# Patient Record
Sex: Female | Born: 1970
Health system: Southern US, Community
[De-identification: ages and names within clinical notes are randomized; demographics above are authoritative.]

## PROBLEM LIST (undated history)

## (undated) DIAGNOSIS — D649 Anemia, unspecified: Secondary | ICD-10-CM

## (undated) DIAGNOSIS — K222 Esophageal obstruction: Secondary | ICD-10-CM

## (undated) DIAGNOSIS — D689 Coagulation defect, unspecified: Secondary | ICD-10-CM

## (undated) DIAGNOSIS — K30 Functional dyspepsia: Secondary | ICD-10-CM

## (undated) DIAGNOSIS — I428 Other cardiomyopathies: Secondary | ICD-10-CM

## (undated) DIAGNOSIS — F4321 Adjustment disorder with depressed mood: Secondary | ICD-10-CM

## (undated) DIAGNOSIS — E611 Iron deficiency: Secondary | ICD-10-CM

## (undated) DIAGNOSIS — Z95810 Presence of automatic (implantable) cardiac defibrillator: Secondary | ICD-10-CM

## (undated) DIAGNOSIS — K589 Irritable bowel syndrome without diarrhea: Secondary | ICD-10-CM

## (undated) DIAGNOSIS — F419 Anxiety disorder, unspecified: Secondary | ICD-10-CM

## (undated) DIAGNOSIS — I82409 Acute embolism and thrombosis of unspecified deep veins of unspecified lower extremity: Secondary | ICD-10-CM

## (undated) DIAGNOSIS — T7840XA Allergy, unspecified, initial encounter: Secondary | ICD-10-CM

## (undated) DIAGNOSIS — I5022 Chronic systolic (congestive) heart failure: Secondary | ICD-10-CM

## (undated) DIAGNOSIS — IMO0002 Reserved for concepts with insufficient information to code with codable children: Secondary | ICD-10-CM

## (undated) DIAGNOSIS — D573 Sickle-cell trait: Secondary | ICD-10-CM

## (undated) DIAGNOSIS — E559 Vitamin D deficiency, unspecified: Secondary | ICD-10-CM

## (undated) DIAGNOSIS — K219 Gastro-esophageal reflux disease without esophagitis: Secondary | ICD-10-CM

## (undated) DIAGNOSIS — A048 Other specified bacterial intestinal infections: Secondary | ICD-10-CM

## (undated) HISTORY — DX: Acute embolism and thrombosis of unspecified deep veins of unspecified lower extremity: I82.409

## (undated) HISTORY — DX: Irritable bowel syndrome, unspecified: K58.9

## (undated) HISTORY — DX: Vitamin D deficiency, unspecified: E55.9

## (undated) HISTORY — DX: Anemia, unspecified: D64.9

## (undated) HISTORY — DX: Allergy, unspecified, initial encounter: T78.40XA

## (undated) HISTORY — DX: Gastro-esophageal reflux disease without esophagitis: K21.9

## (undated) HISTORY — DX: Iron deficiency: E61.1

## (undated) HISTORY — DX: Gastro-esophageal reflux disease without esophagitis: K22.2

## (undated) HISTORY — DX: Functional dyspepsia: K30

## (undated) HISTORY — DX: Coagulation defect, unspecified: D68.9

## (undated) HISTORY — DX: Sickle-cell trait: D57.3

## (undated) HISTORY — DX: Reserved for concepts with insufficient information to code with codable children: IMO0002

## (undated) HISTORY — PX: WISDOM TOOTH EXTRACTION: SHX21

## (undated) HISTORY — DX: Other specified bacterial intestinal infections: A04.8

---

## 1989-09-07 HISTORY — PX: TUBAL LIGATION: SHX77

## 2001-02-11 ENCOUNTER — Encounter: Payer: Self-pay | Admitting: Pulmonary Disease

## 2001-02-21 ENCOUNTER — Encounter: Payer: Self-pay | Admitting: Pulmonary Disease

## 2004-07-10 ENCOUNTER — Ambulatory Visit: Payer: Self-pay | Admitting: Pulmonary Disease

## 2005-11-09 ENCOUNTER — Ambulatory Visit: Payer: Self-pay | Admitting: Pulmonary Disease

## 2006-07-23 ENCOUNTER — Other Ambulatory Visit: Admission: RE | Admit: 2006-07-23 | Discharge: 2006-07-23 | Payer: Self-pay | Admitting: Obstetrics & Gynecology

## 2006-11-17 ENCOUNTER — Ambulatory Visit: Payer: Self-pay | Admitting: Pulmonary Disease

## 2007-07-28 ENCOUNTER — Other Ambulatory Visit: Admission: RE | Admit: 2007-07-28 | Discharge: 2007-07-28 | Payer: Self-pay | Admitting: Obstetrics & Gynecology

## 2007-12-28 ENCOUNTER — Ambulatory Visit: Payer: Self-pay | Admitting: Pulmonary Disease

## 2007-12-28 DIAGNOSIS — K219 Gastro-esophageal reflux disease without esophagitis: Secondary | ICD-10-CM | POA: Insufficient documentation

## 2007-12-28 DIAGNOSIS — J45909 Unspecified asthma, uncomplicated: Secondary | ICD-10-CM | POA: Insufficient documentation

## 2007-12-28 DIAGNOSIS — K222 Esophageal obstruction: Secondary | ICD-10-CM

## 2008-09-06 ENCOUNTER — Other Ambulatory Visit: Admission: RE | Admit: 2008-09-06 | Discharge: 2008-09-06 | Payer: Self-pay | Admitting: Obstetrics and Gynecology

## 2008-11-23 ENCOUNTER — Telehealth (INDEPENDENT_AMBULATORY_CARE_PROVIDER_SITE_OTHER): Payer: Self-pay | Admitting: *Deleted

## 2008-11-28 ENCOUNTER — Telehealth: Payer: Self-pay | Admitting: Gastroenterology

## 2008-11-28 ENCOUNTER — Ambulatory Visit: Payer: Self-pay | Admitting: Pulmonary Disease

## 2008-11-29 ENCOUNTER — Ambulatory Visit: Payer: Self-pay | Admitting: Internal Medicine

## 2008-11-29 DIAGNOSIS — K222 Esophageal obstruction: Secondary | ICD-10-CM | POA: Insufficient documentation

## 2008-11-29 DIAGNOSIS — R6881 Early satiety: Secondary | ICD-10-CM | POA: Insufficient documentation

## 2008-11-29 DIAGNOSIS — K5900 Constipation, unspecified: Secondary | ICD-10-CM | POA: Insufficient documentation

## 2008-11-30 ENCOUNTER — Encounter: Payer: Self-pay | Admitting: Internal Medicine

## 2008-11-30 ENCOUNTER — Ambulatory Visit: Payer: Self-pay | Admitting: Internal Medicine

## 2008-11-30 HISTORY — PX: ESOPHAGOGASTRODUODENOSCOPY (EGD) WITH ESOPHAGEAL DILATION: SHX5812

## 2008-12-04 ENCOUNTER — Telehealth (INDEPENDENT_AMBULATORY_CARE_PROVIDER_SITE_OTHER): Payer: Self-pay | Admitting: *Deleted

## 2008-12-06 ENCOUNTER — Telehealth (INDEPENDENT_AMBULATORY_CARE_PROVIDER_SITE_OTHER): Payer: Self-pay | Admitting: *Deleted

## 2008-12-08 ENCOUNTER — Emergency Department (HOSPITAL_COMMUNITY): Admission: EM | Admit: 2008-12-08 | Discharge: 2008-12-08 | Payer: Self-pay | Admitting: Emergency Medicine

## 2008-12-10 ENCOUNTER — Telehealth: Payer: Self-pay | Admitting: Internal Medicine

## 2008-12-11 ENCOUNTER — Telehealth (INDEPENDENT_AMBULATORY_CARE_PROVIDER_SITE_OTHER): Payer: Self-pay | Admitting: *Deleted

## 2008-12-12 ENCOUNTER — Ambulatory Visit (HOSPITAL_COMMUNITY): Admission: RE | Admit: 2008-12-12 | Discharge: 2008-12-12 | Payer: Self-pay | Admitting: Pulmonary Disease

## 2008-12-12 ENCOUNTER — Encounter: Payer: Self-pay | Admitting: Pulmonary Disease

## 2008-12-12 ENCOUNTER — Ambulatory Visit: Payer: Self-pay | Admitting: Internal Medicine

## 2008-12-20 ENCOUNTER — Telehealth (INDEPENDENT_AMBULATORY_CARE_PROVIDER_SITE_OTHER): Payer: Self-pay | Admitting: *Deleted

## 2009-01-15 ENCOUNTER — Telehealth (INDEPENDENT_AMBULATORY_CARE_PROVIDER_SITE_OTHER): Payer: Self-pay | Admitting: *Deleted

## 2009-01-15 ENCOUNTER — Telehealth: Payer: Self-pay | Admitting: Internal Medicine

## 2009-01-15 ENCOUNTER — Ambulatory Visit: Payer: Self-pay | Admitting: Internal Medicine

## 2009-01-15 DIAGNOSIS — A048 Other specified bacterial intestinal infections: Secondary | ICD-10-CM

## 2009-01-15 HISTORY — DX: Other specified bacterial intestinal infections: A04.8

## 2009-04-08 ENCOUNTER — Telehealth: Payer: Self-pay | Admitting: Internal Medicine

## 2009-05-15 ENCOUNTER — Ambulatory Visit: Payer: Self-pay | Admitting: Internal Medicine

## 2009-05-15 LAB — CONVERTED CEMR LAB
ALT: 15 units/L (ref 0–35)
AST: 20 units/L (ref 0–37)
Albumin: 4.1 g/dL (ref 3.5–5.2)
Alkaline Phosphatase: 47 units/L (ref 39–117)
BUN: 8 mg/dL (ref 6–23)
Basophils Absolute: 0 10*3/uL (ref 0.0–0.1)
Basophils Relative: 0.3 % (ref 0.0–3.0)
CO2: 28 meq/L (ref 19–32)
Calcium: 9.3 mg/dL (ref 8.4–10.5)
Chloride: 107 meq/L (ref 96–112)
Creatinine, Ser: 0.9 mg/dL (ref 0.4–1.2)
Eosinophils Absolute: 0.2 10*3/uL (ref 0.0–0.7)
Eosinophils Relative: 5.3 % — ABNORMAL HIGH (ref 0.0–5.0)
GFR calc non Af Amer: 89.31 mL/min (ref >60)
Glucose, Bld: 80 mg/dL (ref 70–99)
HCT: 35.5 % — ABNORMAL LOW (ref 36.0–46.0)
Hemoglobin: 12.2 g/dL (ref 12.0–15.0)
Lymphocytes Relative: 35.5 % (ref 12.0–46.0)
Lymphs Abs: 1.6 10*3/uL (ref 0.7–4.0)
MCHC: 34.4 g/dL (ref 30.0–36.0)
MCV: 85.3 fL (ref 78.0–100.0)
Monocytes Absolute: 0.5 10*3/uL (ref 0.1–1.0)
Monocytes Relative: 10.2 % (ref 3.0–12.0)
Neutro Abs: 2.3 10*3/uL (ref 1.4–7.7)
Neutrophils Relative %: 48.7 % (ref 43.0–77.0)
Platelets: 196 10*3/uL (ref 150.0–400.0)
Potassium: 3.9 meq/L (ref 3.5–5.1)
RBC: 4.16 M/uL (ref 3.87–5.11)
RDW: 13.1 % (ref 11.5–14.6)
Sodium: 143 meq/L (ref 135–145)
TSH: 0.37 microintl units/mL (ref 0.35–5.50)
Total Bilirubin: 0.8 mg/dL (ref 0.3–1.2)
Total Protein: 7.3 g/dL (ref 6.0–8.3)
WBC: 4.6 10*3/uL (ref 4.5–10.5)

## 2009-05-17 ENCOUNTER — Ambulatory Visit (HOSPITAL_COMMUNITY): Admission: RE | Admit: 2009-05-17 | Discharge: 2009-05-17 | Payer: Self-pay | Admitting: Internal Medicine

## 2009-05-21 ENCOUNTER — Telehealth: Payer: Self-pay | Admitting: Internal Medicine

## 2009-08-02 ENCOUNTER — Ambulatory Visit: Payer: Self-pay | Admitting: Internal Medicine

## 2009-12-26 ENCOUNTER — Telehealth (INDEPENDENT_AMBULATORY_CARE_PROVIDER_SITE_OTHER): Payer: Self-pay | Admitting: *Deleted

## 2010-01-22 ENCOUNTER — Ambulatory Visit: Payer: Self-pay | Admitting: Pulmonary Disease

## 2010-03-06 ENCOUNTER — Encounter: Payer: Self-pay | Admitting: Internal Medicine

## 2010-03-06 LAB — CONVERTED CEMR LAB
Hemoglobin: 12.2 g/dL
MCV: 83 fL
Platelets: 214 10*3/uL
TSH: 1.56 microintl units/mL
WBC: 4.6 10*3/uL

## 2010-03-14 ENCOUNTER — Encounter: Payer: Self-pay | Admitting: Internal Medicine

## 2010-04-15 ENCOUNTER — Encounter: Payer: Self-pay | Admitting: Internal Medicine

## 2010-04-16 ENCOUNTER — Emergency Department (HOSPITAL_COMMUNITY): Admission: EM | Admit: 2010-04-16 | Discharge: 2010-04-16 | Payer: Self-pay | Admitting: Emergency Medicine

## 2010-05-06 ENCOUNTER — Ambulatory Visit: Payer: Self-pay | Admitting: Internal Medicine

## 2010-08-29 ENCOUNTER — Telehealth: Payer: Self-pay | Admitting: Pulmonary Disease

## 2010-10-07 NOTE — Assessment & Plan Note (Signed)
Summary: f/u asthma   Chief Complaint:  Yearly Follow up. Pt denied any new complaints.  Pt needs new rx for Nexium and Advair. Marland Kitchen  History of Present Illness: the patient comes in today for follow-up of her chronic asthma.  She has been on appropriate medications, and has been using these compliantly.  She has had no increase in rescue inhaler use, and no nocturnal symptoms.  She has had no change in her exercise tolerance, and is satisfied with her quality of life with respect to activity.  She has had occasional breakthrough reflux symptoms, but not overly significant.  She continues on her proton pump inhibitor.     Current Allergies: No known allergies      Review of Systems      See HPI   Vital Signs:  Patient Profile:   40 Years Old Female Weight:      127.25 pounds O2 Sat:      96 % O2 treatment:    Room Air Temp:     98.1 degrees F oral Pulse rate:   72 / minute BP sitting:   110 / 86  (left arm) Cuff size:   regular  Vitals Entered By: Cyndia Diver LPN (December 28, 2007 9:59 AM)             Comments Medications reviewed with patient  ..................................................................Marland KitchenCyndia Diver LPN  December 28, 2007 9:59 AM      Physical Exam  General:     thin female in no acute distress Lungs:     totally clear to auscultation with no wheezing Heart:     regular rate and rhythm, no MRG     Impression & Recommendations:  Problem # 1:  ASTHMA (ICD-493.90) the patient's asthma is well controlled at this time.  She is having no difficulty with her medications, and has not been using her rescue inhaler frequently.  I have asked her to stay on her current regimen, including her reflux medications.  She will follow-up in one year or sooner if there are problems.  Medications Added to Medication List This Visit: 1)  Ventolin Hfa 108 (90 Base) Mcg/act Aers (Albuterol sulfate) .... Inhale 2 puffs every 4 to 6 hours as needed   Patient  Instructions: 1)  Please schedule a follow-up appointment in 1 year.    Prescriptions: NEXIUM 40 MG  PACK (ESOMEPRAZOLE MAGNESIUM) Take 1 tablet by mouth two times a day  #180 x 6   Entered and Authorized by:   Barbaraann Share MD   Signed by:   Barbaraann Share MD on 12/28/2007   Method used:   Print then Give to Patient   RxID:   [id number] ADVAIR DISKUS 250-50 MCG/DOSE  MISC (FLUTICASONE-SALMETEROL) Inhale 1 puff two times a day  #3 x 6   Entered and Authorized by:   Barbaraann Share MD   Signed by:   Barbaraann Share MD on 12/28/2007   Method used:   Print then Give to Patient   RxID:   [id number]  ]

## 2010-10-07 NOTE — Progress Notes (Signed)
Summary: Question about medication dosage  Phone Note Call from Patient Call back at [phone]   Call For: Dr Leone Payor Reason for Call: Talk to Nurse Summary of Call: Question about medication Dr Leone Payor ordered. How often is she to take it? Initial call taken by: Leanor Kail [facility],  May 21, 2009 9:45 AM  Follow-up for Phone Call        Pt had questions re dosage of Buspirone.  Went over directions with pt.  Pt stated she understood. Ashok Cordia RN  May 21, 2009 10:10 AM

## 2010-10-07 NOTE — Letter (Signed)
Summary: [facility]  [facility]   Imported By: Lennie Odor 05/13/2010 15:31:23  _____________________________________________________________________  External Attachment:    Type:   Image     Comment:   External Document

## 2010-10-07 NOTE — Assessment & Plan Note (Signed)
Summary: rov for asthma   Copy to:  Marcelyn Bruins, MD Primary Provider/Referring Provider:  Roxine Caddy, MD  CC:  Pt is here for a f/u appt.  Pt was last seen March 2010.  Pt states breathing is "fine."  Pt denied a cough.  Pt states she would like written rx for 90 day supply of Nexium.  Pt also wants handicap form filled out. Marland Kitchen  History of Present Illness: the pt comes in today for f/u of her known asthma.  She has been doing well on her current regimen, and rarely uses her rescue inhaler.  She denies cough, congestion, or mucus.  She is staying on her ppi for GERD  Current Medications (verified): 1)  Advair Diskus 250-50 Mcg/dose  Misc (Fluticasone-Salmeterol) .... Inhale 1 Puff Two Times A Day 2)  Nexium 40 Mg  Pack (Esomeprazole Magnesium) .... Take 1 Tablet By Mouth Two Times A Day 3)  Ventolin Hfa 108 (90 Base) Mcg/act  Aers (Albuterol Sulfate) .... Inhale 2 Puffs Every 4 To 6 Hours As Needed 4)  Buspirone Hcl 10 Mg Tabs (Buspirone Hcl) .... As Needed  Allergies (verified): 1)  ! Amoxicillin 2)  ! Biaxin  Review of Systems      See HPI  Vital Signs:  Patient profile:   40 year old female Height:      61 inches Weight:      134 pounds BMI:     25.41 O2 Sat:      97 % on Room air Temp:     98.1 degrees F oral Pulse rate:   70 / minute BP sitting:   130 / 78  (left arm) Cuff size:   regular  Vitals Entered By: Arman Filter LPN (Jan 22, 2010 9:28 AM)  O2 Flow:  Room air CC: Pt is here for a f/u appt.  Pt was last seen March 2010.  Pt states breathing is "fine."  Pt denied a cough.  Pt states she would like written rx for 90 day supply of Nexium.  Pt also wants handicap form filled out.  Comments Medications reviewed with patient Arman Filter LPN  Jan 22, 2010 9:28 AM    Physical Exam  General:  wd female in nad Lungs:  totally clear to auscultation Heart:  rrr, no mrg   Impression & Recommendations:  Problem # 1:  ASTHMA (ICD-493.90) the pt is doing well from  an asthma standpoint on her current meds, with no recent exacerbation or rescue inhaler use.  I have asked her to continue on the current meds, and to f/u in one year.  Medications Added to Medication List This Visit: 1)  Buspirone Hcl 10 Mg Tabs (Buspirone hcl) .... As needed  Other Orders: Est. Patient Level II ([address])  Patient Instructions: 1)  no change in meds 2)  stay active 3)  followup with me in one year.   Prescriptions: NEXIUM 40 MG  PACK (ESOMEPRAZOLE MAGNESIUM) Take 1 tablet by mouth two times a day  #180 x 4   Entered and Authorized by:   Barbaraann Share MD   Signed by:   Barbaraann Share MD on 01/22/2010   Method used:   Print then Give to Patient   RxID:   [id number]

## 2010-10-07 NOTE — Assessment & Plan Note (Signed)
Summary: DYSPHAGIA, EARLY SATIETY, BLOATING   History of Present Illness Visit Type: Follow-up Visit Primary GI MD: Stan Head MD [facility] Primary Provider: Roxine Caddy, MD Requesting Provider: n/a Chief Complaint: early satiety, dysphagia History of Present Illness:   Forcing herself to eat Feels like food hangs in her throat at times, e.g. chicken, not once daily, perhaps about 1x/week still getting early satiety bloated all the time post-prandial belching frequently her sleep is disturbed by stomach issues like abdominal pain about 2-3 x/week weight loss of 5# since May, has had similar weights in past  she admits to stress and anxiety and concern about uncovered medical problems she is separated and that is a cause of anxiety, kids are acting out sometimes   GI Review of Systems    Reports abdominal pain, belching, bloating, dysphagia with solids, loss of appetite, and  nausea.     Location of  Abdominal pain: epigastric area.    Denies acid reflux, chest pain, dysphagia with liquids, heartburn, vomiting, vomiting blood, weight loss, and  weight gain.      Reports change in bowel habits, constipation, and  diarrhea.     Denies anal fissure, black tarry stools, diverticulosis, fecal incontinence, heme positive stool, hemorrhoids, irritable bowel syndrome, jaundice, light color stool, liver problems, rectal bleeding, and  rectal pain.    Current Medications (verified): 1)  Advair Diskus 250-50 Mcg/dose  Misc (Fluticasone-Salmeterol) .... Inhale 1 Puff Two Times A Day 2)  Nexium 40 Mg  Pack (Esomeprazole Magnesium) .... Take 1 Tablet By Mouth Two Times A Day 3)  Ventolin Hfa 108 (90 Base) Mcg/act  Aers (Albuterol Sulfate) .... Inhale 2 Puffs Every 4 To 6 Hours As Needed  Allergies (verified): 1)  ! Amoxicillin 2)  ! Biaxin  Past History:  Past Medical History: G E R D Esophaeal stricture (dilated 2010) H. pylori gastritis treated with Pylera Asthma     Past Surgical  History: Reviewed history from 11/29/2008 and no changes required. Tubal Ligation  Family History: Reviewed history from 01/15/2009 and no changes required. 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  Alcohol Use - no Daily Caffeine Use  <1 Illicit Drug Use - no Separated, 2 children at home, 1 grown  Review of Systems       some hot flashes/sweats at night last week, followed by chills (2 nights), associaed with diarrhea no true fever no significant insomnia + fatigue  Vital Signs:  Patient profile:   40 year old female Height:      61 inches Weight:      124.13 pounds BMI:     23.54 BSA:     1.54 Pulse rate:   72 / minute Pulse rhythm:   regular BP sitting:   126 / 82  (right arm)  Vitals Entered By: Hortense Ramal CMA Duncan Dull) (May 15, 2009 9:48 AM)  Physical Exam  General:  Well developed, well nourished, no acute distress. Eyes:  anicteric Neck:  Supple; no masses or thyromegaly. Lungs:  Clear throughout to auscultation. Heart:  Regular rate and rhythm; no murmurs, rubs,  or bruits. Abdomen:  Abdomen soft, nontender, nondistended. No obvious masses or hepatomegaly. No obvious hernias. Normal bowel sounds.  Neurologic:  Alert and  oriented x4;  Psych:  somewhat flat affect   Impression & Recommendations:  Problem # 1:  DYSPHAGIA (ZOX-096.04) Assessment Improved at EGD I dilated ring  in 3/10 better but not relieved ? ineffective dilation, motility issue or relationship to situational stressors/amxiety  Orders: Barium Swallow with Tablet (BS w/tab)  Problem # 2:  EARLY SATIETY (ICD-780.94) Assessment: Unchanged did not resolve after treatment of H. pylori with Pylera check labs ? TCA, Buspar, anti-spasmodic  Orders: TLB-CBC Platelet - w/Differential (85025-CBCD) TLB-CMP (Comprehensive Metabolic Pnl) (80053-COMP)  TLB-TSH (Thyroid Stimulating Hormone) (13244-WNU)  Patient Instructions: 1)  Please go to the basement to have your lab tests drawn today. 2)  This office will call after your xray and labs are complete and Dr. Leone Payor will recommend a treatment plan and/or other testing at that time. 3)  Copy sent to : Roxine Caddy, MD 4)  The medication list was reviewed and reconciled.  All changed / newly prescribed medications were explained.  A complete medication list was provided to the patient / caregiver.

## 2010-10-07 NOTE — Procedures (Signed)
Summary: EGD - ring (dilated) and H. pylori gastritis   EGD  Procedure date:  11/30/2008  Findings:      Location: [facility]    EGD  Procedure date:  11/30/2008  Findings:      1) Ring, esophageal at the gastroesophageal junction, dilated 54 French 2) Mild non-erosive gastritis in the total stomach, biopsied H. PYLORI + 3) Otherwise normal examination.  Comments:      will treat H. pylori with Amoxicillin and Biaxin and Nexium  ENDOSCOPY PROCEDURE REPORT  PATIENT:  Mary Pratt, Mary Pratt  MR#:  [id number] BIRTHDATE:   1970/10/09, 37 yrs. old   GENDER:   female  ENDOSCOPIST:   Iva Boop, Mary Pratt, [facility]    PROCEDURE DATE:  11/30/2008 PROCEDURE:  EGD with biopsy, Elease Hashimoto Dilation of the Esophagus ASA CLASS:   Class II INDICATIONS: 1) dysphagia early satiety  MEDICATIONS:    Epinephrine 75 mcg IV, Versed 6 mg IV TOPICAL ANESTHETIC:   Exactacain Spray  DESCRIPTION OF PROCEDURE:   After the risks benefits and alternatives of the procedure were thoroughly explained, informed consent was obtained.  The  endoscope was introduced through the mouth and advanced to the second portion of the duodenum, without limitations.  The instrument was slowly withdrawn as the mucosa was carefully examined. <<PROCEDUREIMAGES>>          <<OLD IMAGES>>  An esophageal ring was found at the gastroesophageal junction. ? of distal esophageal ring.  Mild gastritis was found in the total stomach. Multiple biopsies were obtained and sent to pathology.  The examination was otherwise normal.    Dilation was then performed at the total esophagus  1) Dilator:   Elease Hashimoto   Size(s):   54 French Resistance:   Minimal   Heme:   none Appearance:   No relook   COMPLICATIONS:   None  ENDOSCOPIC IMPRESSION:  1) Ring, esophageal at the gastroesophageal junction, dilated 54 French  2) Mild non-erosive gastritis in the total stomach, biopsied  3) Otherwise normal examination. RECOMMENDATIONS:  1)  Clear liquids until 12 noon, then soft diet, then normal diet tomorrow.  2) After pathology in letter vs. phone call re: any therapy changes. Has been on Nexium x 5 yrs so may need different PPI. Some of symptoms suggest more of functional disturbance so may need different type of therapy.      Iva Boop, Mary Pratt, Clementeen Graham   CC: Mary Pratt, M.D. Mary Caddy, Mary Pratt The Patient       REPORT OF SURGICAL PATHOLOGY   Case #: [id number] Patient Name: Mary Pratt, Mary Pratt Office Chart Number:  N/A   MRN: [id number] Pathologist: Ferd Hibbs. Colonel Bald, Mary Pratt DOB/Age  12-01-1970 (Age: 54)    Gender: F Date Taken:  11/30/2008 Date Received: 11/30/2008   FINAL DIAGNOSIS   ***MICROSCOPIC EXAMINATION AND DIAGNOSIS***   STOMACH, BIOPSY: - CHRONIC, ACTIVE GASTRITIS WITH HELICOBACTER PYLORI.   - THERE IS NO EVIDENCE OF INTESTINAL METAPLASIA, DYSPLASIA OR MALIGNANCY. - SEE COMMENT.   COMMENT A Warthin-Starry stain highlights the presence of a few scattered organisms, consistent with Helicobacter pylori.  The control stained appropriately.   gdt Date Reported:  12/03/2008     Ferd Hibbs. Colonel Bald, Mary Pratt *** Electronically Signed Out By JBK ***  This report was created from the original endoscopy report, which was reviewed and signed by the above listed endoscopist.

## 2010-10-07 NOTE — Progress Notes (Signed)
Summary: sob  Phone Note Call from Patient Call back at [phone]   Caller: Patient Call For: clance Summary of Call: sob with allergy problem pharmacy [facility] Initial call taken by: Rickard Patience,  December 04, 2008 3:37 PM  Follow-up for Phone Call        called and spoke with pt. Pt states she saw her pcp recently and was dx with allergies and given zyrtec and omnaris to take.  pt states she is still very congested in her head and "swimmy headed"  Pt wanted to know if there is something KC can recommended.  Informed pt KC out of office until tomorrow.  Also, pt c/o increased sob.  using alb inhaler every hr with no relief of sob.   Informed pt that if she is that sob she needs to go to urgent care of ED.  Pt verbalized understanding.  Aundra Millet Reynolds LPN  December 04, 2008 3:48 PM    pt called back a few minutes later and also requested [facility] refer her to a cardiologist to see if her increased sob is cardiac related.  Aundra Millet Reynolds LPN  December 04, 2008 4:50 PM   Additional Follow-up for Phone Call Additional follow up Details #1::        I do not believe this is from her lungs, and she should  not overuse albuterol if it doesn't help.  Will schedule her for an echo to evaluate heart, and agree with going to er if worsens. Additional Follow-up by: Barbaraann Share MD,  December 05, 2008 6:34 PM    Additional Follow-up for Phone Call Additional follow up Details #2::    pt aware per dr clance the pcc's are setting up echo appt and will call her as soon as we can get this done, if symptoms worsen or persist advised pt to go to [facility]--pt states she understood.  Bjorn Loser advised to call pt's cell # once echo appt is set up Follow-up by: Philipp Deputy CMA,  December 06, 2008 10:18 AM

## 2010-10-07 NOTE — Progress Notes (Signed)
Summary: breathing problem  Phone Note Call from Patient Call back at [phone]   Caller: Patient Call For: clance Summary of Call: problem with digestion and breathing Initial call taken by: Rickard Patience,  November 23, 2008 8:58 AM  Follow-up for Phone Call        Pt states she has trouble getting deep breath in at time.  Using Nexium and Advair and Ventolin for relief.  Pt states she is not acutely SOB.  Made OV with KC for 11/28/08 to assess.  Pt instructed TCB if becomes actutely SOB for sooner appt or go to ED. Follow-up by: Cloyde Reams RN,  November 23, 2008 10:10 AM

## 2010-10-07 NOTE — Progress Notes (Signed)
Summary: medication for H Pylori  Phone Note Outgoing Call   Call placed by: Paulene Floor, RN,  December 06, 2008 9:31 AM Call placed to: Patient Summary of Call: Calling in medications for H Pylori  Follow-up for Phone Call        Scripts sent to pharmacy.  will continue the nexium.  will call on Monday to schedule follow up appointent in 4-6 weeks with dr Leone Payor Follow-up by: Paulene Floor, RN,  December 06, 2008 9:35 AM    New/Updated Medications: AMOXICILLIN 500 MG TABS (AMOXICILLIN) Take 2 tabs by mouth two times a day x 14 days BIAXIN 500 MG TABS (CLARITHROMYCIN) take 1 tab two times a day x 14 days   Prescriptions: BIAXIN 500 MG TABS (CLARITHROMYCIN) take 1 tab two times a day x 14 days  #28 x 0   Entered by:   Paulene Floor, RN   Authorized by:   Iva Boop MD   Signed by:   Paulene Floor, RN on 12/06/2008   Method used:   [facility] to        [facility]  [address] [address] [id number]       [address]       [place], [place]  [address]       Ph: [phone] or [phone]       Fax: [phone]   RxID:   [id number] AMOXICILLIN 500 MG TABS (AMOXICILLIN) Take 2 tabs by mouth two times a day x 14 days  #56 x 0   Entered by:   Paulene Floor, RN   Authorized by:   Iva Boop MD   Signed by:   Paulene Floor, RN on 12/06/2008   Method used:   [facility]        [facility]  [address] [address] [id number]       [address]       [place], [place]  [address]       Ph: [phone] or [phone]       Fax: [phone]   RxID:   [id number]   Appended Document: medication for H Pylori Flag sent to Sherri to remind patient to schedule follow up appointment.

## 2010-10-07 NOTE — Progress Notes (Signed)
Summary: reaction to meds  Phone Note Call from Patient Call back at Home Phone [phone]   Caller: Patient Call For: Leone Payor Reason for Call: Talk to Nurse Summary of Call: patient states that she has a reaction to medication given to her on Friday (CLARITHROMYCIN) Initial call taken by: Tawni Levy,  December 10, 2008 10:01 AM  Follow-up for Phone Call        Left message for patient to call back Darcey Nora RN  December 10, 2008 10:56 AM  Patient developed CP and SOB, dizziness, and HTN went to ER for symptoms.  I advised the patient to stop clarithromycin and amoxicillin and we will call her next week when Dr Leone Payor returns for alternate tx.  She states her last sode was on Saturday, but still having symptoms.  She has an appointment with her primary care MD today. Follow-up by: Darcey Nora RN,  December 10, 2008 4:10 PM  Additional Follow-up for Phone Call Additional follow up Details #1::        follow-up with her by phone does not sound like a drug reaction  from these to me need more info Additional Follow-up by: Iva Boop MD,  December 16, 2008 10:19 PM    Additional Follow-up for Phone Call Additional follow up Details #2::    Left message for patient to call back Darcey Nora RN  December 17, 2008 9:42 AM  patient says that she was tachcardic "makes my heart race".  She says her primary care MD also advised her to stop meds.  Please advise. Follow-up by: Darcey Nora RN,  December 17, 2008 2:33 PM  Additional Follow-up for Phone Call Additional follow up Details #3:: Details for Additional Follow-up Action Taken: Still not convinced was med reaction but would not readminister at this point is going to need an rev to review things and consider other therapy next available Additional Follow-up by: Iva Boop MD,  December 17, 2008 8:03 PM    REV scheduled for 01-15-09 10:30  Darcey Nora RN  December 18, 2008 9:46 AM

## 2010-10-07 NOTE — Assessment & Plan Note (Signed)
Summary: early satiety/lk   History of Present Illness Visit Type: Follow-up Consult Primary GI MD: Stan Head MD [facility] Primary Provider: Roxine Caddy, PA Requesting Provider: Fara Chute, MD Chief Complaint: Satiety History of Present Illness:   40 yo woman with gastric symptoms of early satiety previously helped by buspirone. She was feeling ok and stopped it in  Has also had chest and throat pains, inablity to burp. Pain was burning in nature. Problems began about 3-4 weeks ago.  Also dyspneic. Restarted buspirone x 1 week. She tried Dexilant x 2-3 weeks per PCP without change and went back to Nexium and  Zantac. All of her problems atrted after she stopped buspirone. She has had some dysphagia, with grapes, chicken. Drinks alot of fluid and it goes down.   GI Review of Systems    Reports acid reflux, bloating, loss of appetite, and  nausea.      Denies abdominal pain, belching, chest pain, dysphagia with liquids, dysphagia with solids, heartburn, vomiting, vomiting blood, weight loss, and  weight gain.      Reports constipation.     Denies anal fissure, black tarry stools, change in bowel habit, diarrhea, diverticulosis, fecal incontinence, heme positive stool, hemorrhoids, irritable bowel syndrome, jaundice, light color stool, liver problems, rectal bleeding, and  rectal pain.    Clinical Reports Reviewed:  EGD:  11/30/2008:  1) Ring, esophageal at the gastroesophageal junction, dilated 54 French 2) Mild non-erosive gastritis in the total stomach, biopsied H. PYLORI + 3) Otherwise normal examination.   Current Medications (verified): 1)  Advair Diskus 250-50 Mcg/dose  Misc (Fluticasone-Salmeterol) .... Inhale 1 Puff Two Times A Day 2)  Nexium 40 Mg  Pack (Esomeprazole Magnesium) .... Take 1 Tablet By Mouth Two Times A Day 3)  Ventolin Hfa 108 (90 Base) Mcg/act  Aers (Albuterol Sulfate) .... Inhale 2 Puffs Every 4 To 6 Hours As Needed 4)  Buspirone Hcl 10 Mg Tabs (Buspirone  Hcl) .... As Needed  Allergies (verified): 1)  ! Amoxicillin 2)  ! Biaxin  Past History:  Past Medical History: Reviewed history from 05/15/2009 and no changes required. 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 pylori gastritis treated with Pylera Asthma     Past Surgical History: Reviewed history from 08/02/2009 and no changes required. Tubal Ligation Wisdom teeth Extraction  Family History: Reviewed history from 01/15/2009 and no changes required. No FH of Colon Cancer: Lung Cancer-Grandmother Brain cancer-uncle Family History of Diabetes: Mother  Social History: Reviewed history from 05/15/2009 and no changes required. 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  Alcohol Use - no Daily Caffeine Use  <1 Illicit Drug Use - no Separated, 2 children at home, 1 grown  Vital Signs:  Patient profile:   40 year old female Height:      61 inches Weight:      129 pounds BMI:     24.46 Pulse rate:   68 / minute Pulse rhythm:   regular BP sitting:   120 / 82  (left arm) Cuff size:   regular  Vitals Entered By: June McMurray CMA Duncan Dull) (May 06, 2010 2:09 PM)  Physical Exam  General:  Well developed, well nourished, no acute distress. Lungs:  Clear throughout to auscultation. Heart:  Regular rate and rhythm; no murmurs, rubs,  or bruits. Abdomen:  Soft, nontender and nondistended. No masses, hepatosplenomegaly or hernias noted. Normal bowel sounds.   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  restart and stay on buspirone she can only take 5 mg in am so will do 5mg /10mg  if she fails to resolve will consider other investigations but not at this time cbc and cmet, tsh and lipids ok 02/24/10  Problem # 2:   GERD (ICD-530.81) Assessment: Deteriorated I thnk burning may actually be from stopping buspirone certainly may be component of anxiety and functional pain also  Problem # 3:  DYSPHAGIA (ICD-787.29) stricture vs. functional or th  Patient Instructions: 1)  Please schedule a follow-up appointment in 6  weeks.  2)  Restart and stay on the buspirone as written. 3)  Need to let it get back into your system to see full effects. 4)  If you are all better you may cancel the appointment. 5)  Copy sent to : Roxine Caddy, PA-C 6)  The medication list was reviewed and reconciled.  All changed / newly prescribed medications were explained.  A complete medication list was provided to the patient / caregiver. Prescriptions: BUSPIRONE HCL 10 MG TABS (BUSPIRONE HCL) 1/2 tab each AM and 1 tab at night  #45 x 11   Entered and Authorized by:   Iva Boop MD, [facility]   Signed by:   Iva Boop MD, [facility] on 05/06/2010   Method used:   [facility] [phone]* (retail)       [address]       [place], [place]  [address]       Ph: [phone] or [phone]       Fax: [phone]   RxID:   [id number]

## 2010-10-07 NOTE — Progress Notes (Signed)
Summary: ? re meds  Phone Note Call from Patient Call back at Home Phone [phone] Call back at [phone]   Caller: Patient Reason for Call: Talk to Nurse Summary of Call: Patient has questions regarding meds (Pylera)-Not sure how many times a day to take per day. Initial call taken by: Tawni Levy,  Jan 15, 2009 4:08 PM  Follow-up for Phone Call        Message left for pt to return call.  Francee Piccolo CMA  Jan 16, 2009 2:02 PM   Pt was prescribed Pylera 3 tablets three times a day, but protocol is 3 tablets four times a day by manufacturer.  Pharmacy released entire kit to pt, do you want pt to take three times a day or four times a day?  Please advise.  (Per pt OK to leave message on home voicemail.) Follow-up by: Francee Piccolo CMA,  Jan 16, 2009 3:51 PM  Additional Follow-up for Phone Call Additional follow up Details #1::        My mistake its qid - 4 times/day Additional Follow-up by: Iva Boop MD,  Jan 16, 2009 4:12 PM    Additional Follow-up for Phone Call Additional follow up Details #2::    Message left on voicemail to take pylera four times a day and apologized for the confusion. Follow-up by: Francee Piccolo CMA,  Jan 16, 2009 4:52 PM

## 2010-10-07 NOTE — Progress Notes (Signed)
Summary: date pt saw kc in 2002  Phone Note Call from Patient Call back at [facility] Phone [phone]   Caller: Patient Call For: clance Summary of Call: pt wants to know the date that nexium was first prescribed for her.  Initial call taken by: Tivis Ringer,  Jan 15, 2009 8:55 AM  Follow-up for Phone Call        Need paper chart for review.  Chart req. Vernie Murders  Jan 15, 2009 9:05   gave pt the date she saw kc in 2002, this was all she needed Follow-up by: Philipp Deputy CMA,  Jan 15, 2009 11:07 AM

## 2010-10-07 NOTE — Assessment & Plan Note (Signed)
Summary: dysphagia and early satiety   History of Present Illness Visit Type: Follow-up Visit Primary GI MD: Stan Head MD [facility] Primary Provider: Roxine Caddy, MD Requesting Provider: n/a Chief Complaint: Dysphagia, early satiety improved History of Present Illness:   40 yo african-American woman with early satiety and dysphagia problems. She was recently started on buspirone and has noted improvement of early satiety problemsand the perceived dysphagia. She has been light-headed at times. She is on the buspirone 10 mg once daily instead of two times a day due to a presription error. She does notice that beneficial effects wear off in evening.           Current Medications (verified): 1)  Advair Diskus 250-50 Mcg/dose  Misc (Fluticasone-Salmeterol) .... Inhale 1 Puff Two Times A Day 2)  Nexium 40 Mg  Pack (Esomeprazole Magnesium) .... Take 1 Tablet By Mouth Two Times A Day 3)  Ventolin Hfa 108 (90 Base) Mcg/act  Aers (Albuterol Sulfate) .... Inhale 2 Puffs Every 4 To 6 Hours As Needed 4)  Buspirone Hcl 10 Mg Tabs (Buspirone Hcl) .... 1/2 Tablet Two Times A Day X 1 Week Then 1 Tablet Once Daily  Allergies (verified): 1)  ! Amoxicillin 2)  ! Biaxin  Past History:  Past Medical History: Reviewed history from 05/15/2009 and no changes required. 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 pylori gastritis treated with Pylera Asthma     Past Surgical History: Tubal Ligation Wisdom teeth Extraction  Family History: Reviewed history from 01/15/2009 and no changes required. No FH of Colon Cancer: Lung Cancer-Grandmother Brain cancer-uncle Family History of Diabetes: Mother  Social History: Reviewed history from 05/15/2009 and no changes required. 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  Alcohol Use - no Daily Caffeine Use  <1 Illicit Drug Use - no Separated, 2 children at home, 1 grown  Vital Signs:  Patient profile:   40 year old female Height:       61 inches Weight:      127.38 pounds BMI:     24.16 Pulse rate:   76 / minute Pulse rhythm:   regular BP sitting:   120 / 80  (left arm) Cuff size:   regular  Vitals Entered By: June McMurray CMA Duncan Dull) (August 02, 2009 11:37 AM)  Physical Exam  General:  Well developed, well nourished, no acute distress.   Impression & Recommendations:  Problem # 1:  DYSPHAGIA (ICD-787.29) Assessment Improved Ring dilated 11/2008 and barium swallow recently ok. Not having problems now, ? if early satiety sxs were interpreted as swallowing problems.  Problem # 2:  EARLY SATIETY (ICD-780.94) Assessment: Improved buspirone has helped significantly, indication functional GI disturbance (impaired gastric accomodation) will get her on two times a day dosing but try 5-10 mg (she can adjust) to see if light-headedness is less of a problem 1 year Rx and f/u as needed she aske how long to continue the buspirone and likely indefinitely she does have situational stressors and if that improves could consider a trial off drug  Patient Instructions: 1)  Please continue current medications.  You can try the buspirone at  a 1/2 tablet twice a day but it may take 1 whole tablet twice a day to help. 2)  Please schedule a follow-up appointment as needed.  3)  Copy sent to : Roxine Caddy, MD 4)  The medication list was reviewed and reconciled.  All changed / newly prescribed medications were explained.  A complete medication list was  provided to the patient / caregiver. Prescriptions: BUSPIRONE HCL 10 MG TABS (BUSPIRONE HCL) 1/2 tablet to 1 tablet two times a day  #60 x 11   Entered and Authorized by:   Iva Boop MD, [facility]   Signed by:   Iva Boop MD, [facility] on 08/02/2009   Method used:   [facility]  [address] [id number]       [address]       [place], [place]  [address]       Ph: [phone] or [phone]       Fax: [phone]   RxID:    [id number]

## 2010-10-07 NOTE — Progress Notes (Signed)
Summary: ? TODAYS VISIT  Phone Note Call from Patient Call back at Home Phone [phone]   Caller: Patient Call For: Hildreth Robart Reason for Call: Talk to Nurse Summary of Call: was seen today and has ? re: h-pylori and gastritis Initial call taken by: Guadlupe Spanish [facility],  Jan 15, 2009 1:20 PM  Follow-up for Phone Call        advised pt that gastritis could be caused by the h. pylori bacteria and symptoms should improve after taking pylera.  pt voices understanding.  I advised pt to make sure she takes medication until gone and to continue once daily PPI after finishing pylera. Follow-up by: Francee Piccolo CMA,  Jan 15, 2009 2:09 PM

## 2010-10-07 NOTE — Progress Notes (Signed)
Summary: rx for Nexium   Phone Note Call from Patient Call back at Home Phone [phone] Call back at or [phone]   Caller: Patient Call For: [facility] Reason for Call: Refill Medication, Talk to Nurse Summary of Call: NEXIUM - NEED 90 DAY SUPPLY SENT TO [facility], Initial call taken by: Eugene Gavia,  December 26, 2009 10:31 AM  Follow-up for Phone Call        called and spoke with pt.  pt last saw [facility] 11/28/2008.  Informed pt, it is state law that we see pt at least once a year in order to refill her meds.  pt verbalized understanding and scheduled appt with KC for 01/22/2010 at 9:30am.  [facility] [place] to last until pt's ov.  Megan Reynolds LPN  December 26, 2009 10:41 AM     Prescriptions: NEXIUM 40 MG  PACK (ESOMEPRAZOLE MAGNESIUM) Take 1 tablet by mouth two times a day  #60 x 0   Entered by:   Arman Filter LPN   Authorized by:   Barbaraann Share MD   Signed by:   Arman Filter LPN on 62/13/0865   Method used:   [facility]        [facility]  [address] [id number]ail[facility]       [address]       [place], [place]  [address]       Ph: [phone] or [phone]       Fax: [phone]   RxID:   [id number]

## 2010-10-07 NOTE — Letter (Signed)
Summary: [facility]   Imported By: Lester Leary 05/02/2010 09:50:17  _____________________________________________________________________  External Attachment:    Type:   Image     Comment:   External Document

## 2010-10-07 NOTE — Progress Notes (Signed)
Summary: Medication question  Phone Note Call from Patient Call back at Home Phone [phone]   Call For: DR Lasasha Brophy Reason for Call: Talk to Nurse Summary of Call: Has a question abput medcines that she got last year. Wonders how long it takes for her condition to heal. Initial call taken by: Leanor Kail [facility],  April 08, 2009 11:32 AM  Follow-up for Phone Call        Questions answered about h. pylori and Pylera.  patient still c/o early satiety and will keep appointment with Dr Leone Payor for 05-15-09 Follow-up by: Darcey Nora RN, CGRN,  April 08, 2009 12:03 PM

## 2010-10-07 NOTE — Assessment & Plan Note (Signed)
Summary: discuss h pylori tx   History of Present Illness Visit Type: Follow-up Visit Primary GI MD: Stan Head MD [facility] Primary Provider: Roxine Caddy, MD Requesting Provider: Marcelyn Bruins, MD Chief Complaint: H pylori History of Present Illness:    Caller: Patient Call For: Leone Payor Reason for Call: Talk to Nurse Summary of Call: patient states that she has a reaction to medication given to her on Friday (CLARITHROMYCIN) Initial call taken by: Tawni Levy,  December 10, 2008 10:01          Left message for patient to call back Darcey Nora RN  December 10, 2008 10:56 AM  Patient developed CP and SOB, dizziness, and HTN went to ER for symptoms.  I advised the patient to stop clarithromycin and amoxicillin and we will call her next week when Dr Leone Payor returns for alternate tx.  She states her last sode was on Saturday, but still having symptoms.  She has an appointment with her primary care MD today.  She had problems after taking Biaxin and amoxicillin as outlined above, though had SOB before the medications. She still has post-prandial bloating and epigastric discomfort.  Alternating bowel movements also. No dysphagia. No heartburn.           Current Medications (verified): 1)  Advair Diskus 250-50 Mcg/dose  Misc (Fluticasone-Salmeterol) .... Inhale 1 Puff Two Times A Day 2)  Nexium 40 Mg  Pack (Esomeprazole Magnesium) .... Take 1 Tablet By Mouth Two Times A Day 3)  Ventolin Hfa 108 (90 Base) Mcg/act  Aers (Albuterol Sulfate) .... Inhale 2 Puffs Every 4 To 6 Hours As Needed  Allergies (verified): 1)  ! Amoxicillin 2)  ! Biaxin  Past History:  Past Medical History:    G E R D    Esophaeal stricture (dilated 2010)    H. pylori gastritis    Asthma           Past Surgical History:    Reviewed history from 11/29/2008 and no changes required:    Tubal Ligation  Family History:    No FH of Colon Cancer:    Lung Cancer-Grandmother    Brain cancer-uncle    Family History  of Diabetes: Mother  Social History:    Reviewed history from 11/29/2008 and no changes required:       Occupation: Dietitian       Patient has never smoked.        Alcohol Use - no       Daily Caffeine Use  3+       Illicit Drug Use - no  Vital Signs:  Patient profile:   40 year old female Height:      61 inches Weight:      129.13 pounds BMI:     24.49 Pulse rate:   64 / minute Pulse rhythm:   regular BP sitting:   112 / 80  (left arm)  Vitals Entered By: June McMurray CMA (Jan 15, 2009 10:35 AM)  Physical Exam  General:  Well developed, well nourished, no acute distress.   Impression & Recommendations:  Problem # 1:  HELICOBACTER PYLORI GASTRITIS (ICD-041.86) Assessment Comment Only will try Pylera tx see if it helps early satiety and bloating if not to return in 2-3 months  Problem # 2:  EARLY SATIETY (ICD-780.94) Assessment: Comment Only  Problem # 3:  ESOPHAGEAL STRICTURE (ICD-530.3) Assessment: Improved No dysphagia Emphasized need to take PPI  we discussed possible osteoporosis and GI infections on  Nexium but since she has GERD and asthma and did have stricture, has definite indication for PPI long-term  Problem # 4:  GERD (ICD-530.81) continue PPI some concerns about side effects such as osteoporosis but I think benefits > risks at this time and told her so  Patient Instructions: 1)  Please pick up your medications at your pharmacy. 2)  If you are not better in 2-3 months return to see me. 3)  H. pylori infection information given. 4)  The medication list was reviewed and reconciled.  All changed / newly prescribed medications were explained.  A complete medication list was provided to the patient / caregiver. Prescriptions: PYLERA 140-125-125 MG CAPS (BIS SUBCIT-METRONID-TETRACYC) take 3 capsules three times a day x 10 days  #90 x 0   Entered and Authorized by:   Iva Boop MD   Signed by:   Iva Boop MD on 01/15/2009   Method used:    [facility]        [facility]  [address] [id number]ail)       [address]       [place], [place]  [address]       Ph: [phone] or [phone]       Fax: [phone]   RxID:   [id number]

## 2010-10-07 NOTE — Progress Notes (Signed)
Summary: prescript  Phone Note Call from Patient   Caller: Patient Call For: wert Summary of Call: need refill for nexium 90 day supply send to [facility] Initial call taken by: Rickard Patience,  December 20, 2008 10:23 AM      Prescriptions: NEXIUM 40 MG  PACK (ESOMEPRAZOLE MAGNESIUM) Take 1 tablet by mouth two times a day  #180 x 6   Entered by:   Abigail Miyamoto RN   Authorized by:   Barbaraann Share MD   Signed by:   Abigail Miyamoto RN on 12/20/2008   Method used:   Faxed to ...       [facility]* (mail-order)             ,          Ph: [phone]       Fax: [phone]   RxID:   [id number]

## 2010-10-09 NOTE — Progress Notes (Signed)
Summary: out of advair-  Phone Note Call from Patient Call back at Home Phone [phone]   Caller: Patient Call For: [facility] Summary of Call: pt requests rx [facility] [place] now.  Initial call taken by: Tivis Ringer, CNA,  August 29, 2010 9:19 AM  Follow-up for Phone Call        LMTCBx1 to ask pt if she wants a 90 day supply? Carron Curie CMA  August 29, 2010 10:06 AM  spoke with the pt and she requests 30 day supply. rx sent. pt aware. Carron Curie CMA  August 29, 2010 11:53 AM     Prescriptions: ADVAIR DISKUS 250-50 MCG/DOSE  MISC (FLUTICASONE-SALMETEROL) Inhale 1 puff two times a day  #1 x 6   Entered by:   Carron Curie CMA   Authorized by:   Barbaraann Share MD   Signed by:   Carron Curie CMA on 08/29/2010   Method used:   [facility]        [facility]  [address] [id number]       [address]       [place], [place]  [address]       Ph: [phone] or [phone]       Fax: [phone]   RxID:   [id number]

## 2010-10-10 ENCOUNTER — Telehealth: Payer: Self-pay | Admitting: Internal Medicine

## 2010-10-13 ENCOUNTER — Encounter: Payer: Self-pay | Admitting: Internal Medicine

## 2010-10-23 NOTE — Progress Notes (Signed)
Summary: rx refills  Phone Note Call from Patient Call back at Home Phone [phone]   Caller: Patient Call For: Dr Leone Payor Reason for Call: Talk to Nurse Summary of Call: Patient wants refill for her rx (buspirone) but was denied because she needs to make an appt but she states that Dr Leone Payor told her that if she wasn't having any probs she did not need to come in for an appt. Initial call taken by: Tawni Levy,  October 10, 2010 3:56 PM  Follow-up for Phone Call        Patient wants to know if she can get Buspirone refilled and does she need an office visit because she was told that she needed an office visit but patient said that she was told if she is feeling better she will not need an office visit.  Follow-up by: Ok Anis CMA,  October 10, 2010 4:20 PM  Additional Follow-up for Phone Call Additional follow up Details #1::        I called pharmacy and they said that patient had two refills on file but the RX expired. So patient will need a refill. Additional Follow-up by: Ok Anis CMA,  October 10, 2010 4:31 PM    Additional Follow-up for Phone Call Additional follow up Details #2::    done she should have an REV by July-Aug 2012 to coontinue with refills or obtain them from her PCP Follow-up by: Iva Boop MD, Clement[facility] Graham,  October 11, 2010 11:44 AM  Additional Follow-up for Phone Call Additional follow up Details #3:: Details for Additional Follow-up Action Taken: called patient and left message with husband for patient to call us back this morning at 9:30 am  called patient again and left another message for patient to call us back. 12:51 pm Ok Anis CMA,  October 13, 2010 12:51 PM  Spoke with Dr Leone Payor. He is aware that I have been unsuccessful in contacting patient to make appointment. He states that it will be okay just to send patient a letter stating that we have refilled her medications and that she needs to call back to schedule office visit. I  will create letter and send to patient. Ok Anis CMA  October 13, 2010 1:50 PM   Prescriptions: BUSPIRONE HCL 10 MG TABS (BUSPIRONE HCL) 1/2 tab each AM and 1 tab at night  #60 Tablet x 5   Entered and Authorized by:   Iva Boop MD, [facility]   Signed by:   Iva Boop MD, [facility] on 10/11/2010   Method used:   [facility]        [facility]  [address] [id number]       [address]       [place], [place]  [address]       Ph: [phone] or [phone]       Fax: [phone]   RxID:   [id number]

## 2010-10-23 NOTE — Letter (Signed)
Summary: Office Visit Needed  [facility]  [address] [place], [place] [address]   Phone: [phone]  Fax: [phone]                10/13/2010  MAKINZI PRIEUR [address] [place], [place]  [address]  Dear Ms. Eckersley,    Our office has attempted to contact you at multiple phone numbers that have been provided to [place] by you in the past. We have unfortunately been unable to get in touch with you. In response to your call on 10-10-10, we have sent a prescription refill for your Buspirone to your pharmacy. However, Dr Leone Payor has requested that you come for a routine office visit for further refills. It is very important that you do follow up with our office on a regular basis. Please contact us at [phone] in order to schedule an appointment. We thank you in advance for your cooperation and look forward to continuing serving your health care needs.  Sincerely,     Stan Head, MD

## 2010-11-03 ENCOUNTER — Telehealth (INDEPENDENT_AMBULATORY_CARE_PROVIDER_SITE_OTHER): Payer: Self-pay | Admitting: *Deleted

## 2010-11-13 NOTE — Progress Notes (Signed)
Summary: refill  Phone Note Call from Patient Call back at Home Phone [phone]   Caller: Patient Call For: clance Reason for Call: Refill Medication Summary of Call: Requests refill on ventolin 108 hfa.//[facility] Initial call taken by: Darletta Moll,  November 03, 2010 3:33 PM  Follow-up for Phone Call        Rx was refilled.  Spoke with pt and notified this was done. Follow-up by: Vernie Murders,  November 03, 2010 4:00 PM    Prescriptions: VENTOLIN HFA 108 (90 BASE) MCG/ACT  AERS (ALBUTEROL SULFATE) inhale 2 puffs every 4 to 6 hours as needed  #1 x 1   Entered by:   Vernie Murders   Authorized by:   Barbaraann Share MD   Signed by:   Vernie Murders on 11/03/2010   Method used:   [facility]  [address] [id number]       [address]       [place], [place]  [address]       Ph: [phone] or [phone]       Fax: [phone]   RxID:   [id number]

## 2010-11-14 ENCOUNTER — Telehealth: Payer: Self-pay | Admitting: Pulmonary Disease

## 2010-11-18 NOTE — Progress Notes (Signed)
Summary: needs prescription for advir faxec to [facility]  Phone Note Call from Patient Call back at Home Phone [phone]   Caller: Patient Call For: clance Summary of Call: patient phoned stated that she needed a prescription for Advir 250/50 faxed to [facility]. She can be reached at [phone]  Initial call taken by: Vedia Coffer,  November 14, 2010 2:00 PM  Follow-up for Phone Call        3 month supply with no refills sent to [facility] per pt request- need to advise her to keep her May appt for refills. LMTCB x 1  Vernie Murders  November 14, 2010 3:06 PM  Relayed Leslie's msg to pt and she verbalized understanding.Darletta Moll  November 14, 2010 4:46 PM      Prescriptions: ADVAIR DISKUS 250-50 MCG/DOSE  MISC (FLUTICASONE-SALMETEROL) Inhale 1 puff two times a day  #3 x 0   Entered by:   Vernie Murders   Authorized by:   Barbaraann Share MD   Signed by:   Vernie Murders on 11/14/2010   Method used:   Faxed to ...       [facility] (mail-order)             , [place]         Ph: [phone]       Fax: [phone]   RxID:   [id number]

## 2010-12-11 ENCOUNTER — Ambulatory Visit: Payer: Self-pay | Admitting: Internal Medicine

## 2010-12-17 LAB — DIFFERENTIAL
Basophils Absolute: 0 10*3/uL (ref 0.0–0.1)
Basophils Relative: 0 % (ref 0–1)
Eosinophils Absolute: 0 10*3/uL (ref 0.0–0.7)
Eosinophils Relative: 0 % (ref 0–5)
Lymphocytes Relative: 9 % — ABNORMAL LOW (ref 12–46)
Lymphs Abs: 0.8 10*3/uL (ref 0.7–4.0)
Monocytes Absolute: 0.3 10*3/uL (ref 0.1–1.0)
Monocytes Relative: 3 % (ref 3–12)
Neutro Abs: 7.3 10*3/uL (ref 1.7–7.7)
Neutrophils Relative %: 87 % — ABNORMAL HIGH (ref 43–77)

## 2010-12-17 LAB — POCT I-STAT, CHEM 8
BUN: 8 mg/dL (ref 6–23)
Calcium, Ion: 1.02 mmol/L — ABNORMAL LOW (ref 1.12–1.32)
Chloride: 110 mEq/L (ref 96–112)
Creatinine, Ser: 1 mg/dL (ref 0.4–1.2)
Glucose, Bld: 107 mg/dL — ABNORMAL HIGH (ref 70–99)
HCT: 37 % (ref 36.0–46.0)
Hemoglobin: 12.6 g/dL (ref 12.0–15.0)
Potassium: 3.2 mEq/L — ABNORMAL LOW (ref 3.5–5.1)
Sodium: 138 mEq/L (ref 135–145)
TCO2: 22 mmol/L (ref 0–100)

## 2010-12-17 LAB — D-DIMER, QUANTITATIVE (NOT AT ARMC): D-Dimer, Quant: 2.46 ug/mL-FEU — ABNORMAL HIGH (ref 0.00–0.48)

## 2010-12-17 LAB — POCT CARDIAC MARKERS
CKMB, poc: <1 ng/mL — ABNORMAL LOW (ref 1.0–8.0)
CKMB, poc: <1 ng/mL — ABNORMAL LOW (ref 1.0–8.0)
Myoglobin, poc: 105 ng/mL (ref 12–200)
Myoglobin, poc: 78.3 ng/mL (ref 12–200)
Troponin i, poc: <0.05 ng/mL (ref 0.00–0.09)
Troponin i, poc: <0.05 ng/mL (ref 0.00–0.09)

## 2010-12-17 LAB — CBC
HCT: 35.5 % — ABNORMAL LOW (ref 36.0–46.0)
Hemoglobin: 12.2 g/dL (ref 12.0–15.0)
MCHC: 34.3 g/dL (ref 30.0–36.0)
MCV: 85.9 fL (ref 78.0–100.0)
Platelets: 188 10*3/uL (ref 150–400)
RBC: 4.13 MIL/uL (ref 3.87–5.11)
RDW: 13.5 % (ref 11.5–15.5)
WBC: 8.4 10*3/uL (ref 4.0–10.5)

## 2011-01-14 ENCOUNTER — Encounter: Payer: Self-pay | Admitting: Pulmonary Disease

## 2011-01-15 ENCOUNTER — Ambulatory Visit: Payer: Self-pay | Admitting: Pulmonary Disease

## 2011-01-16 ENCOUNTER — Ambulatory Visit (INDEPENDENT_AMBULATORY_CARE_PROVIDER_SITE_OTHER): Payer: Self-pay | Admitting: Pulmonary Disease

## 2011-01-16 ENCOUNTER — Encounter: Payer: Self-pay | Admitting: Pulmonary Disease

## 2011-01-16 VITALS — BP 112/84 | HR 81 | Temp 98.0°F | Ht 61.0 in | Wt 133.8 lb

## 2011-01-16 DIAGNOSIS — J45909 Unspecified asthma, uncomplicated: Secondary | ICD-10-CM

## 2011-01-16 NOTE — Progress Notes (Signed)
  Subjective:    Patient ID: Mary Pratt, female    DOB: 19-May-1971, 40 y.o.   MRN: [id number]  HPI The pt comes in today for f/u of her known asthma.  She has been compliant with her meds, and has had no breathing issues.  She has not had nocturnal breakthru, and has not been using rescue inhaler.  No issues with doe or URI's.     Review of Systems  Constitutional: Negative for fever and unexpected weight change.  HENT: Negative for ear pain, nosebleeds, congestion, sore throat, rhinorrhea, sneezing, trouble swallowing, dental problem, postnasal drip and sinus pressure.   Eyes: Negative for redness and itching.  Respiratory: Negative for cough, chest tightness, shortness of breath and wheezing.   Cardiovascular: Negative for palpitations and leg swelling.  Gastrointestinal: Negative for nausea and vomiting.  Genitourinary: Negative for dysuria.  Musculoskeletal: Negative for joint swelling.  Skin: Positive for rash.  Neurological: Negative for headaches.  Hematological: Does not bruise/bleed easily.  Psychiatric/Behavioral: Negative for dysphoric mood. The patient is not nervous/anxious.        Objective:   Physical Exam Wd female in nad Chest clear to auscultation Cor with rrr LE without edema, no cyanosis Alert, oriented, moves all 4        Assessment & Plan:

## 2011-01-16 NOTE — Assessment & Plan Note (Signed)
The pt is doing well from an asthma standpoint.  She has had no acute exacerbations, is satisfied with her exercise tolerance, and is not overusing her rescue inhaler.  Will continue with same meds and see in one year.

## 2011-01-16 NOTE — Patient Instructions (Signed)
No change in meds for asthma followup with me in one year.

## 2011-01-22 ENCOUNTER — Ambulatory Visit (INDEPENDENT_AMBULATORY_CARE_PROVIDER_SITE_OTHER): Payer: BC Managed Care – PPO | Admitting: Internal Medicine

## 2011-01-22 ENCOUNTER — Encounter: Payer: Self-pay | Admitting: Internal Medicine

## 2011-01-22 VITALS — BP 122/64 | HR 88 | Ht 61.0 in | Wt 132.0 lb

## 2011-01-22 DIAGNOSIS — K222 Esophageal obstruction: Secondary | ICD-10-CM

## 2011-01-22 DIAGNOSIS — K3189 Other diseases of stomach and duodenum: Secondary | ICD-10-CM

## 2011-01-22 DIAGNOSIS — K30 Functional dyspepsia: Secondary | ICD-10-CM

## 2011-01-22 DIAGNOSIS — K219 Gastro-esophageal reflux disease without esophagitis: Secondary | ICD-10-CM

## 2011-01-22 DIAGNOSIS — A048 Other specified bacterial intestinal infections: Secondary | ICD-10-CM

## 2011-01-22 MED ORDER — BUSPIRONE HCL 10 MG PO TABS: 10.0000 mg | ORAL_TABLET | Freq: Two times a day (BID) | ORAL | Status: DC

## 2011-01-22 NOTE — Patient Instructions (Signed)
It might be possible to reduce your Nexium to once a day. I will check with Dr. Shelle Iron about this. If so we will notify you. You should be able to get your buspirone refilled through primary care and see me as needed, for problems and not routine follow-up. Iva Boop, MD, [facility]

## 2011-01-23 ENCOUNTER — Encounter: Payer: Self-pay | Admitting: Internal Medicine

## 2011-01-23 ENCOUNTER — Telehealth: Payer: Self-pay | Admitting: Pulmonary Disease

## 2011-01-23 DIAGNOSIS — K30 Functional dyspepsia: Secondary | ICD-10-CM | POA: Insufficient documentation

## 2011-01-23 NOTE — Assessment & Plan Note (Signed)
Continue he will well on buspirone 10 mg twice a day. I have refilled that today. As long as she is doing well I suspect this could be refilled by her primary care physician and would not require specialist visit with me.

## 2011-01-23 NOTE — Progress Notes (Signed)
40 year old [place] American woman followed for GERD with esophageal stricture, functional dyspepsia with chronic early satiety. She has been successfully treated with Nexium and buspirone. She recently saw Dr. Shelle Iron and asthma is under control. He made no therapy changes. She continues to do well without significant early satiety and no dysphagia or heartburn. He needs refills of her buspirone.

## 2011-01-23 NOTE — Assessment & Plan Note (Signed)
She is doing well on Nexium 40 mg twice a day. Sometimes associated with asthma as a contributor to flares or perhaps as a result of the asthma and negative intrathoracic pressure. Given her age and the potential possible problems with chronic high-dose PPI therapy, I think he would be appropriate to try to reduce her dose. Ask Dr. Shelle Iron if he thinks this is reasonable. She will probably need to taper the nighttime dose. Might need to go to 20 mg bid.

## 2011-01-23 NOTE — Telephone Encounter (Signed)
Message copied by Marcelyn Bruins on Fri Jan 23, 2011  6:34 PM ------      Message from: Stan Head      Created: Fri Jan 23, 2011  8:31 AM      Regarding: reduce Nexium       Please review her chart - my and your last encounters      Given potential possible problems with hih-dose PPI long term i think we should try to lower her dose - probably try qd             Is this ok re: asthma? I do not think there is sig harm in trying

## 2011-01-23 NOTE — Telephone Encounter (Signed)
Megan, please let pt know that I received a call from Dr. Leone Payor, and we are going to see how she does taking nexium once a day.  thanksl

## 2011-01-26 NOTE — Telephone Encounter (Signed)
[id number] 

## 2011-01-27 NOTE — Telephone Encounter (Signed)
Patient phoned stated she was returning a call to [facility] she can be reached at [phone].Mary Pratt

## 2011-01-27 NOTE — Telephone Encounter (Signed)
Called and spoke with pt.  Pt aware KC spoke with Dr. Leone Payor and pt is to take Nexium once daily and see how she does.  Pt verbalized understanding.

## 2011-04-14 ENCOUNTER — Telehealth: Payer: Self-pay | Admitting: Pulmonary Disease

## 2011-04-14 MED ORDER — ESOMEPRAZOLE MAGNESIUM 40 MG PO CPDR: 40.0000 mg | DELAYED_RELEASE_CAPSULE | Freq: Two times a day (BID) | ORAL | Status: DC

## 2011-04-14 NOTE — Telephone Encounter (Signed)
Pt last seen 5.11.12, told to follow up in 1 year.  Refills sent to [facility].  Called spoke with pt's spouse, he is aware and will inform pt.  Will sign off.

## 2011-07-27 ENCOUNTER — Other Ambulatory Visit: Payer: Self-pay | Admitting: Pulmonary Disease

## 2011-07-27 ENCOUNTER — Telehealth: Payer: Self-pay | Admitting: Pulmonary Disease

## 2011-07-27 MED ORDER — FLUTICASONE-SALMETEROL 250-50 MCG/DOSE IN AEPB: 1.0000 | INHALATION_SPRAY | Freq: Two times a day (BID) | RESPIRATORY_TRACT | Status: DC

## 2011-07-27 NOTE — Telephone Encounter (Signed)
I spoke with pt and she states she needs a refill on her advair sent to [facility]. i advised pt will send rx and nothing further was needed

## 2011-09-08 HISTORY — PX: COLPOSCOPY: SHX161

## 2012-01-18 ENCOUNTER — Ambulatory Visit: Payer: BC Managed Care – PPO | Admitting: Pulmonary Disease

## 2012-03-02 ENCOUNTER — Telehealth: Payer: Self-pay | Admitting: Pulmonary Disease

## 2012-03-02 NOTE — Telephone Encounter (Signed)
Spoke with pt and notified no samples available at this time, offered to send in rx and she refused. Advised her to keep August ov with [facility] and can obtain samples at that time if available.

## 2012-04-14 ENCOUNTER — Ambulatory Visit: Payer: BC Managed Care – PPO | Admitting: Pulmonary Disease

## 2012-04-26 ENCOUNTER — Other Ambulatory Visit: Payer: Self-pay | Admitting: Pulmonary Disease

## 2012-05-02 ENCOUNTER — Telehealth: Payer: Self-pay | Admitting: Pulmonary Disease

## 2012-05-02 ENCOUNTER — Other Ambulatory Visit: Payer: Self-pay | Admitting: *Deleted

## 2012-05-02 MED ORDER — ESOMEPRAZOLE MAGNESIUM 40 MG PO CPDR: 40.0000 mg | DELAYED_RELEASE_CAPSULE | Freq: Two times a day (BID) | ORAL | Status: DC

## 2012-05-02 NOTE — Telephone Encounter (Signed)
Called and spoke with patient informed her that Rx-- Nexium 40mg  2 times daily-- will be sent to verified pharmacy.  Reminded patient of upcoming appt and to please keep.  Patient verbalized understanding and nothing further needed at this time.

## 2012-05-02 NOTE — Telephone Encounter (Signed)
I have called [facility] spoke with Alfredia Client and cancelled RX for the nexium and I have sent new rx to [facility] pharmacy.  lmomtcb x1 for pt on cell and home #

## 2012-05-03 NOTE — Telephone Encounter (Signed)
Patient calling back.  She states she has gotten her rx worked out and needs nothing else from [place].

## 2012-05-03 NOTE — Telephone Encounter (Signed)
lmomtcb on pt's home and cell # 

## 2012-05-08 DIAGNOSIS — R87619 Unspecified abnormal cytological findings in specimens from cervix uteri: Secondary | ICD-10-CM

## 2012-05-08 DIAGNOSIS — IMO0002 Reserved for concepts with insufficient information to code with codable children: Secondary | ICD-10-CM

## 2012-05-08 HISTORY — DX: Unspecified abnormal cytological findings in specimens from cervix uteri: R87.619

## 2012-05-08 HISTORY — DX: Reserved for concepts with insufficient information to code with codable children: IMO0002

## 2012-05-20 ENCOUNTER — Ambulatory Visit (INDEPENDENT_AMBULATORY_CARE_PROVIDER_SITE_OTHER): Payer: BC Managed Care – PPO | Admitting: Pulmonary Disease

## 2012-05-20 ENCOUNTER — Encounter: Payer: Self-pay | Admitting: Pulmonary Disease

## 2012-05-20 VITALS — BP 120/72 | HR 79 | Temp 98.7°F | Ht 61.0 in | Wt 124.0 lb

## 2012-05-20 DIAGNOSIS — Z23 Encounter for immunization: Secondary | ICD-10-CM

## 2012-05-20 DIAGNOSIS — J45909 Unspecified asthma, uncomplicated: Secondary | ICD-10-CM

## 2012-05-20 MED ORDER — ESOMEPRAZOLE MAGNESIUM 40 MG PO CPDR: 40.0000 mg | DELAYED_RELEASE_CAPSULE | Freq: Two times a day (BID) | ORAL | Status: DC

## 2012-05-20 MED ORDER — ALBUTEROL SULFATE HFA 108 (90 BASE) MCG/ACT IN AERS: 2.0000 | INHALATION_SPRAY | Freq: Four times a day (QID) | RESPIRATORY_TRACT | Status: DC | PRN

## 2012-05-20 NOTE — Assessment & Plan Note (Signed)
The patient is doing very well from an asthma standpoint.  She has had no significant symptoms, and rarely ever uses her rescue.  I've asked her to continue on her maintenance regimen, and to followup with me in one year.

## 2012-05-20 NOTE — Patient Instructions (Addendum)
No change in meds Will give you the flu shot today followup with me in one year.

## 2012-05-20 NOTE — Progress Notes (Signed)
  Subjective:    Patient ID: Mary Pratt, female    DOB: June 12, 1971, 41 y.o.   MRN: [id number]  HPI The patient comes in today for followup of her known asthma.  She is staying on her Advair daily, and keeping control of her reflux.  She has had no acute exacerbation of her pulmonary infection since her last visit.  She rarely uses her rescue inhaler, and is satisfied with her exertional tolerance.  She has had no significant cough or chest congestion.   Review of Systems  Constitutional: Negative for fever and unexpected weight change.  HENT: Negative for ear pain, nosebleeds, congestion, sore throat, rhinorrhea, sneezing, trouble swallowing, dental problem, postnasal drip and sinus pressure.   Eyes: Negative for redness and itching.  Respiratory: Negative for cough, chest tightness, shortness of breath and wheezing.   Cardiovascular: Negative for palpitations and leg swelling.  Gastrointestinal: Negative for nausea and vomiting.  Genitourinary: Negative for dysuria.  Musculoskeletal: Negative for joint swelling.  Skin: Negative for rash.  Neurological: Negative for headaches.  Hematological: Does not bruise/bleed easily.  Psychiatric/Behavioral: Negative for dysphoric mood. The patient is not nervous/anxious.        Objective:   Physical Exam Well-developed female in no acute distress Nose without purulence or discharge noted Chest totally clear to auscultation, no wheezing Cardiac exam with regular rate and rhythm Lower extremities without edema, no cyanosis Alert and oriented, moves all 4 extremities.       Assessment & Plan:

## 2012-08-12 ENCOUNTER — Telehealth: Payer: Self-pay | Admitting: Internal Medicine

## 2012-08-12 NOTE — Telephone Encounter (Signed)
Patient wanted to know if she needed an repeat esophageal dilation would it cause SOB.  Patient advised that this should not cause her to be SOB.  She will call back for any Gi concerns

## 2012-08-19 ENCOUNTER — Telehealth: Payer: Self-pay | Admitting: Internal Medicine

## 2012-08-19 NOTE — Telephone Encounter (Signed)
Left message for patient to call back  

## 2012-08-22 ENCOUNTER — Ambulatory Visit (INDEPENDENT_AMBULATORY_CARE_PROVIDER_SITE_OTHER): Payer: BC Managed Care – PPO | Admitting: Gastroenterology

## 2012-08-22 ENCOUNTER — Encounter: Payer: Self-pay | Admitting: Internal Medicine

## 2012-08-22 ENCOUNTER — Encounter: Payer: Self-pay | Admitting: Gastroenterology

## 2012-08-22 VITALS — BP 132/84 | HR 90 | Ht 60.5 in | Wt 120.0 lb

## 2012-08-22 DIAGNOSIS — R1310 Dysphagia, unspecified: Secondary | ICD-10-CM | POA: Insufficient documentation

## 2012-08-22 DIAGNOSIS — K222 Esophageal obstruction: Secondary | ICD-10-CM

## 2012-08-22 NOTE — Patient Instructions (Addendum)
You have been scheduled for an endoscopy with propofol. Please follow written instructions given to you at your visit today. If you use inhalers (even only as needed) or a CPAP machine, please bring them with you on the day of your procedure.  Continue your Nexium once daily until your procedure is performed.

## 2012-08-22 NOTE — Progress Notes (Signed)
08/22/2012 Mary Pratt [id number] Jun 04, 1971   History of Present Illness:  Patient presents to the office today with complaints of dysphagia to solid food.  Says that this has been occurring on a daily basis with foods such as meats, etc for the past few weeks.  No problem with liquids, in fact, she tries to drink plenty of liquids to help wash things down.  Points to substernal area when describing her symptoms.  Also complains of globus sensation as well.  Has only been taking one Nexium daily since her last visit here in 01/2011; was decreased from twice a day at that time.  Was taking Buspar, which helped with her previous symptoms of early satiety and dyspepsia, but has stopped taking it recently since the swallowing issue began.  Does not choke on food.  Complains of chest discomfort associated with these symptoms along with excessive belching.  *She is going for some cardiac testing tomorrow just to make sure everything is ok from that standpoint, according to her report.  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  Gastric biopsies also demonstrated Hpylori.  Was treated with Amoxicillin, Biaxin, and Nexium but had a reaction to the antibiotics.  She was switched to Pylera, which she completed.  Current Medications, Allergies, Past Medical History, Past Surgical History, Family History and Social History were reviewed in [employer] record.   Physical Exam: BP 132/84  Pulse 90  Ht 5' 0.5" (1.537 m)  Wt 120 lb (54.432 kg)  BMI 23.05 kg/m2  LMP 08/03/2012 General: Well developed, black female in no acute distress Head: Normocephalic and atraumatic Eyes:  sclerae anicteric, conjunctiva pink  Ears: Normal auditory acuity Mouth:  MMM.  No thrush noted. Lungs: Clear throughout to auscultation Heart: Regular rate and rhythm Abdomen: Soft, non-distended. No masses,  no hepatomegaly. Normal bowel sounds.  Minimal epigastric TTP without R/R/G. Musculoskeletal: Symmetrical with no gross deformities Neurological: Alert oriented x 4, grossly nonfocal Psychological:  Alert and cooperative. Normal mood and affect  Assessment and Recommendations: 1)  Dysphagia to solid foods only 2)  History of esophageal stricture on EGD 2010 3)  GERD 4)  Globus sensation:  Likely secondary to #3.  -Will schedule for EGD with possible dilation with CG. -Advised the patient to stay on her current reflux regimen of Nexium 40 mg once daily until after her procedure.

## 2012-08-22 NOTE — Progress Notes (Signed)
Reviewed and agree with management. Camdyn Beske D. Quint Chestnut, M.D., FACG  

## 2012-08-22 NOTE — Telephone Encounter (Signed)
Patient c/o dysphagia.  She feels her food gets stuck about half way down.  Having difficulty eating and she reports that she has had recurrent sinus infections.  She has some SOB and wonders if that is related to her difficulty swallowing.  Patient advised that SOB should not be related, but we could evaluate her for her dysphagia. Patient with a history of esophageal ring requiring dilation in the past.  She will come in and see Doug Sou today at 1:30

## 2012-08-23 ENCOUNTER — Ambulatory Visit (AMBULATORY_SURGERY_CENTER): Payer: BC Managed Care – PPO | Admitting: Internal Medicine

## 2012-08-23 ENCOUNTER — Encounter: Payer: Self-pay | Admitting: Internal Medicine

## 2012-08-23 VITALS — BP 142/103 | HR 85 | Temp 98.3°F | Resp 19 | Ht 60.0 in | Wt 120.0 lb

## 2012-08-23 DIAGNOSIS — R1310 Dysphagia, unspecified: Secondary | ICD-10-CM

## 2012-08-23 MED ORDER — SODIUM CHLORIDE 0.9 % IV SOLN: 500.0000 mL | INTRAVENOUS | Status: DC

## 2012-08-23 NOTE — Op Note (Addendum)
[facility] [address] [place], [address]   ENDOSCOPY PROCEDURE REPORT  PATIENT: Mary Pratt, Mary Pratt  MR#: [id number] BIRTHDATE: [birth year] , 40  yrs. old GENDER: Female ENDOSCOPIST: Iva Boop, MD, [facility] PROCEDURE DATE:  08/23/2012 PROCEDURE:  EGD, diagnostic and Maloney dilation of esophagus ASA CLASS:     Class II INDICATIONS:  Dyspepsia. MEDICATIONS: MAC sedation, administered by CRNA, These medications were titrated to patient response per physician's verbal order, and propofol (Diprivan) 150mg  IV TOPICAL ANESTHETIC: Cetacaine Spray  DESCRIPTION OF PROCEDURE: After the risks benefits and alternatives of the procedure were thoroughly explained, informed consent was obtained.  The LB GIF-H180 [id number] endoscope was introduced through the mouth and advanced to the second portion of the duodenum. Without limitations.  The instrument was slowly withdrawn as the mucosa was fully examined.      The upper, middle and distal third of the esophagus were carefully inspected and no abnormalities were noted.  The z-line was well seen at the GEJ.  The endoscope was pushed into the fundus which was normal including a retroflexed view.  The antrum, gastric body, first and second part of the duodenum were unremarkable. Retroflexed views revealed no abnormalities.     The scope was then withdrawn from the patient, a 58 [place] Maloney dilator passed without heme or difficulty, and the procedure completed.  COMPLICATIONS: There were no complications. ENDOSCOPIC IMPRESSION: Normal EGD - 48 French dilation performed to treat dysphagia  RECOMMENDATIONS: 1.  Clear liquids until , then soft foods rest of day.  Resume prior diet tomorrow. 2.   If not completely better in 2-3 weeks call the office and schedule a follow-up appointment. You may need other testing of esophageal function or treatment.    eSigned:  Iva Boop, MD, [facility] 08/25/2012 4:35 PM Revised:  08/25/2012 4:35 PM  CC:Amy Leavy Cella, PA and The Patient

## 2012-08-23 NOTE — Progress Notes (Signed)
Propofol given over incremental dosages 

## 2012-08-23 NOTE — Progress Notes (Signed)
Patient did not experience any of the following events: a burn prior to discharge; a fall within the facility; wrong site/side/patient/procedure/implant event; or a hospital transfer or hospital admission upon discharge from the facility. (G8907) Patient did not have preoperative order for IV antibiotic SSI prophylaxis. (G8918)  

## 2012-08-23 NOTE — Patient Instructions (Addendum)
I did not see any problems but given your history I dilated the esophagus to try to help your swallowing problems and knot in the throat.  Please follow the instructions provided today. Continue current medications.  Call my office at [phone] and schedule a follow-up for January or February if you do not feel completely better in 2 weeks.  Thank you for choosing me and [facility].  Iva Boop, MD, [facility]    Dilatation diet given to your care partner to follow the rest of the day.   YOU HAD AN ENDOSCOPIC PROCEDURE TODAY AT THE [facility]: Refer to the procedure report that was given to you for any specific questions about what was found during the examination.  If the procedure report does not answer your questions, please call your gastroenterologist to clarify.  If you requested that your care partner not be given the details of your procedure findings, then the procedure report has been included in a sealed envelope for you to review at your convenience later.  YOU SHOULD EXPECT: Some feelings of bloating in the abdomen. Passage of more gas than usual.  Walking can help get rid of the air that was put into your GI tract during the procedure and reduce the bloating. If you had a lower endoscopy (such as a colonoscopy or flexible sigmoidoscopy) you may notice spotting of blood in your stool or on the toilet paper. If you underwent a bowel prep for your procedure, then you may not have a normal bowel movement for a few days.  DIET   Drink plenty of fluids but you should avoid alcoholic beverages for 24 hours.  Please follow the dilatation diet the rest of the day.  ACTIVITY: Your care partner should take you home directly after the procedure.  You should plan to take it easy, moving slowly for the rest of the day.  You can resume normal activity the day after the procedure however you should NOT DRIVE or use heavy machinery for 24 hours (because of the sedation  medicines used during the test).    SYMPTOMS TO REPORT IMMEDIATELY: A gastroenterologist can be reached at any hour.  During normal business hours, 8:30 AM to 5:00 PM Monday through Friday, call [phone].  After hours and on weekends, please call the GI answering service at [phone] who will take a message and have the physician on call contact you.     Following upper endoscopy (EGD)  Vomiting of blood or coffee ground material  New chest pain or pain under the shoulder blades  Painful or persistently difficult swallowing  New shortness of breath  Fever of 100F or higher  Black, tarry-looking stools  FOLLOW UP: If any biopsies were taken you will be contacted by phone or by letter within the next 1-3 weeks.  Call your gastroenterologist if you have not heard about the biopsies in 3 weeks.  Our staff will call the home number listed on your records the next business day following your procedure to check on you and address any questions or concerns that you may have at that time regarding the information given to you following your procedure. This is a courtesy call and so if there is no answer at the home number and we have not heard from you through the emergency physician on call, we will assume that you have returned to your regular daily activities without incident.  SIGNATURES/CONFIDENTIALITY: You and/or your care partner have signed paperwork which will  be entered into your electronic medical record.  These signatures attest to the fact that that the information above on your After Visit Summary has been reviewed and is understood.  Full responsibility of the confidentiality of this discharge information lies with you and/or your care-partner.

## 2012-08-23 NOTE — Progress Notes (Signed)
No complaints noted in the recovery room. Maw   

## 2012-08-24 ENCOUNTER — Telehealth: Payer: Self-pay

## 2012-08-24 NOTE — Telephone Encounter (Signed)
  Follow up Call-  Call back number 08/23/2012  Post procedure Call Back phone  # [phone]  Permission to leave phone message Yes     Patient questions:  Do you have a fever, pain , or abdominal swelling? no Pain Score  0 *  Have you tolerated food without any problems? yes  Have you been able to return to your normal activities? yes  Do you have any questions about your discharge instructions: Diet   no Medications  no Follow up visit  no  Do you have questions or concerns about your Care? no  Actions: * If pain score is 4 or above: {ACTION; [facility] ENDO PAIN >4:21563::"No action needed, pain <4."

## 2012-09-05 ENCOUNTER — Encounter: Payer: BC Managed Care – PPO | Admitting: Internal Medicine

## 2012-09-06 ENCOUNTER — Telehealth: Payer: Self-pay | Admitting: Pulmonary Disease

## 2012-09-06 MED ORDER — FLUTICASONE-SALMETEROL 250-50 MCG/DOSE IN AEPB: 1.0000 | INHALATION_SPRAY | Freq: Two times a day (BID) | RESPIRATORY_TRACT | Status: DC

## 2012-09-06 NOTE — Telephone Encounter (Signed)
Rx has been sent, pt is aware.

## 2012-09-21 ENCOUNTER — Telehealth: Payer: Self-pay | Admitting: Pulmonary Disease

## 2012-09-21 MED ORDER — MOMETASONE FURO-FORMOTEROL FUM 100-5 MCG/ACT IN AERO: 2.0000 | INHALATION_SPRAY | Freq: Two times a day (BID) | RESPIRATORY_TRACT | Status: DC

## 2012-09-21 NOTE — Telephone Encounter (Signed)
[facility] - called spoke with pharmacist and advised this is to be cancelled.  Rx re-sent to [facility].

## 2012-09-21 NOTE — Addendum Note (Signed)
Addended by: Boone Master E on: 09/21/2012 05:18 PM   Modules accepted: Orders

## 2012-09-21 NOTE — Telephone Encounter (Signed)
LMOMTCB x 1 

## 2012-09-21 NOTE — Telephone Encounter (Signed)
Patient returned call.  Advised of KC's recs as stated below.  Pt would like to try the dulera 100/5 2 puffs bid.  Pt requesting a 1 month supply be sent to [facility].  This has been done; signed and forwarded to [facility] to make him aware.  MAR updated.

## 2012-09-21 NOTE — Telephone Encounter (Signed)
[facility]. Advair is being denied by pt insurance. Pt must try/fail dulera and symbicort. Please advise Dr. Shelle Iron thanks

## 2012-09-21 NOTE — Telephone Encounter (Signed)
We can substitute either symbicort 160/4.5 or dulera 100/5  At 2 puffs bid, rinse mouth.

## 2012-10-28 ENCOUNTER — Telehealth: Payer: Self-pay | Admitting: Pulmonary Disease

## 2012-10-28 MED ORDER — MOMETASONE FURO-FORMOTEROL FUM 100-5 MCG/ACT IN AERO: 2.0000 | INHALATION_SPRAY | Freq: Two times a day (BID) | RESPIRATORY_TRACT | Status: DC

## 2012-10-28 NOTE — Telephone Encounter (Signed)
Rx has been sent in, pt is aware. 

## 2013-01-13 ENCOUNTER — Telehealth: Payer: Self-pay | Admitting: Nurse Practitioner

## 2013-01-13 ENCOUNTER — Encounter: Payer: Self-pay | Admitting: Gynecology

## 2013-01-13 ENCOUNTER — Ambulatory Visit (INDEPENDENT_AMBULATORY_CARE_PROVIDER_SITE_OTHER): Payer: BC Managed Care – PPO | Admitting: Gynecology

## 2013-01-13 ENCOUNTER — Encounter: Payer: Self-pay | Admitting: Nurse Practitioner

## 2013-01-13 DIAGNOSIS — M545 Low back pain, unspecified: Secondary | ICD-10-CM

## 2013-01-13 DIAGNOSIS — N898 Other specified noninflammatory disorders of vagina: Secondary | ICD-10-CM

## 2013-01-13 LAB — POCT WET PREP (WET MOUNT): Clue Cells Wet Prep Whiff POC: POSITIVE

## 2013-01-13 MED ORDER — FLUCONAZOLE 150 MG PO TABS
ORAL_TABLET | ORAL | Status: DC
Start: 2013-01-13 — End: 2013-01-23

## 2013-01-13 NOTE — Telephone Encounter (Signed)
Patient states she has had a lot of yeast infections lately. OTC medications not helping. Having a lot of vaginal itching with clear discharge.. Appt. Given today with Dr. Farrel Gobble.

## 2013-01-13 NOTE — Patient Instructions (Signed)
cetaphil liquid, no loofa. Microwave underwear, cotton only, rto in 10d

## 2013-01-13 NOTE — Addendum Note (Signed)
Addended by: Douglass Rivers on: 01/13/2013 03:02 PM   Modules accepted: Orders

## 2013-01-13 NOTE — Progress Notes (Signed)
Subjective:     Patient ID: Mary Pratt, female   DOB: Aug 22, 1971, 42 y.o.   MRN: [id number]  HPI Comments: Pt reports having vaginal discharge treated with monostat about 3w ago, reports gets yeast like sx 4-5x/y, pt reports wearing cotton underwear, washes with Lever2000.  No tight clothing.  Pt reports recent LBP, no fever or chills    Review of Systems Per HPI    Objective:   Physical Exam  Constitutional: She appears well-developed and well-nourished.  Musculoskeletal:       Lumbar back: She exhibits no tenderness and no pain.  Pelvic: external genitalia normal, no cervical motion tenderness and positive findings: KOH positive for fungal organisms, positive whiff test, uterine fibroids palpable, vaginal discharge:  scant, white and thick or vaginal pH < 4.5 Anterior fibroid palpable-nontender     Assessment:     Yeast vaginitis     Plan:     Treat with diflucan 150 repeat in 3d, recommend test of cure in 10d, see pt instructions

## 2013-01-13 NOTE — Telephone Encounter (Signed)
Pt says she may have an infection. Please call the cell number [phone].

## 2013-01-15 LAB — CULTURE, URINE COMPREHENSIVE
Colony Count: NO GROWTH
Organism ID, Bacteria: NO GROWTH

## 2013-01-16 ENCOUNTER — Telehealth: Payer: Self-pay | Admitting: *Deleted

## 2013-01-16 NOTE — Telephone Encounter (Signed)
Message copied by Lorraine Lax on Mon Jan 16, 2013 11:17 AM ------      Message from: Douglass Rivers      Created: Mon Jan 16, 2013  8:14 AM       No UTI ------

## 2013-01-16 NOTE — Telephone Encounter (Signed)
Left Message To Call Back  

## 2013-01-16 NOTE — Telephone Encounter (Signed)
Pt notified of no uti results.

## 2013-01-23 ENCOUNTER — Telehealth: Payer: Self-pay | Admitting: Pulmonary Disease

## 2013-01-23 ENCOUNTER — Ambulatory Visit (INDEPENDENT_AMBULATORY_CARE_PROVIDER_SITE_OTHER): Payer: BC Managed Care – PPO | Admitting: Gynecology

## 2013-01-23 VITALS — BP 120/86 | Wt 133.0 lb

## 2013-01-23 DIAGNOSIS — N949 Unspecified condition associated with female genital organs and menstrual cycle: Secondary | ICD-10-CM

## 2013-01-23 DIAGNOSIS — R102 Pelvic and perineal pain: Secondary | ICD-10-CM

## 2013-01-23 DIAGNOSIS — B373 Candidiasis of vulva and vagina: Secondary | ICD-10-CM

## 2013-01-23 LAB — POCT URINALYSIS DIPSTICK
Leukocytes, UA: NEGATIVE
Urobilinogen, UA: NEGATIVE
pH, UA: 5

## 2013-01-23 LAB — POCT WET PREP (WET MOUNT): Clue Cells Wet Prep Whiff POC: NEGATIVE

## 2013-01-23 MED ORDER — BUDESONIDE-FORMOTEROL FUMARATE 160-4.5 MCG/ACT IN AERO: 2.0000 | INHALATION_SPRAY | Freq: Two times a day (BID) | RESPIRATORY_TRACT | Status: DC

## 2013-01-23 NOTE — Telephone Encounter (Signed)
I spoke with Mary Pratt and she stated the dulera is not helping with her breathing and does not feel like it is controlling her asthma. She is wanting to know if we can do prior auth for her to get advair back. Please advise KC on any recs thanks

## 2013-01-23 NOTE — Progress Notes (Signed)
Subjective:     Patient ID: Mary Pratt, female   DOB: November 17, 1970, 42 y.o.   MRN: [id number]  HPI Comments: Pt here for test of cure after treated for yeast, pt reports itching and discharge have resolved but still having pressure and pelvic pain that is suprapubic.  Denies dysuria    Review of Systems Per HPI    Objective:   Physical Exam BP 120/86  Wt 133 lb (60.328 kg)  BMI 25.97 kg/m2 General appearance: alert, cooperative and appears stated age Abdomen: soft nontender, suprapubic tenderness, no rebound or guarding Pelvic: cervix normal in appearance, external genitalia normal, no adnexal masses or tenderness, no cervical motion tenderness and scant discharge high in vault Uterus anteflexed vs anterior fibroid, consistent with area of tenderness    Assessment:     pelvic pain, questionable fibroid uterus Yeast vaginitis resolved    Plan:     Will rto for gyn u/s, reviewed u/s 2010 difficult to image, no fibroid noted agrees to plan

## 2013-01-23 NOTE — Telephone Encounter (Signed)
Spoke with pt and notified of recs per [facility] She verbalized understanding and states will try the symbicort  I have called the rx to her pharm and nothing further needed per pt

## 2013-01-23 NOTE — Telephone Encounter (Signed)
LMTCB

## 2013-01-23 NOTE — Telephone Encounter (Signed)
Pt states that nurse "just called her again". Cell [id number]. Mary Pratt

## 2013-01-23 NOTE — Telephone Encounter (Signed)
We can't control what the insurance is willing to pay for.  Let her know it is likely we will go thru prior auth process and still get denied.  We can try symbicort 160/4.5  2 puffs am and pm everyday in the place of dulera to see if this works better for her. If not, then we will have better chance getting advair???

## 2013-01-23 NOTE — Telephone Encounter (Signed)
lmtcb x1 for pt. 

## 2013-01-24 ENCOUNTER — Encounter: Payer: Self-pay | Admitting: Gynecology

## 2013-01-27 ENCOUNTER — Ambulatory Visit (INDEPENDENT_AMBULATORY_CARE_PROVIDER_SITE_OTHER): Payer: BC Managed Care – PPO | Admitting: Gynecology

## 2013-01-27 ENCOUNTER — Telehealth: Payer: Self-pay | Admitting: Gynecology

## 2013-01-27 VITALS — BP 132/78 | Resp 12

## 2013-01-27 DIAGNOSIS — N930 Postcoital and contact bleeding: Secondary | ICD-10-CM

## 2013-01-27 NOTE — Telephone Encounter (Signed)
Message copied by Lorraine Lax on Fri Jan 27, 2013 10:32 AM ------      Message from: Francisca December K      Created: Fri Jan 27, 2013 10:28 AM       Patient returning your call ------

## 2013-01-27 NOTE — Telephone Encounter (Signed)
PT HAS QUESTIONS REGARDING HER URINE CULTURE

## 2013-01-27 NOTE — Telephone Encounter (Signed)
LMTCB to discuss insurance benefits and schedule PUS.  °

## 2013-01-27 NOTE — Telephone Encounter (Signed)
Pt. Called in regards to having urine culture results which she was notified on the 12th of may, Pt states she is having spotting in between intercourse and after. Also has been experiencing lower back pain x 2-3 days okay for pt to come in today if possible per Dr. Farrel Gobble. Scheduled her for 1:15 today.

## 2013-01-27 NOTE — Progress Notes (Signed)
Subjective:     Patient ID: Mary Pratt, female   DOB: September 21, 1970, 42 y.o.   MRN: [id number]  HPI Comments: Just seen 4d ago, pt presents complaining of bleeding after sex that began after office visit-bleeding descibed as dark, some pain.  Menses due.  Now with low back pain, no fever or chills.unsure regarding partners monogamy    Review of Systems     Objective:   Physical Exam  Constitutional: She is oriented to person, place, and time. She appears well-developed and well-nourished.  Neurological: She is alert and oriented to person, place, and time.  no CVAT Gyn:  Normal external genitalia Vagina: pink and moist Cervix: noo CMT, scant blood at os Uterus: anteflexed, midline tendernes     Assessment:     Post-coital bleeding      Plan:     GC/CTM urine U/s to be scheduled from last visit

## 2013-01-31 ENCOUNTER — Telehealth: Payer: Self-pay | Admitting: *Deleted

## 2013-01-31 NOTE — Telephone Encounter (Signed)
Left Message To Call Back Re: Needing patient to come in and leave gc/chl urine; urine dumped before it could be sent.

## 2013-02-01 ENCOUNTER — Ambulatory Visit (INDEPENDENT_AMBULATORY_CARE_PROVIDER_SITE_OTHER): Payer: BC Managed Care – PPO

## 2013-02-01 ENCOUNTER — Ambulatory Visit (INDEPENDENT_AMBULATORY_CARE_PROVIDER_SITE_OTHER): Payer: BC Managed Care – PPO | Admitting: Gynecology

## 2013-02-01 DIAGNOSIS — N949 Unspecified condition associated with female genital organs and menstrual cycle: Secondary | ICD-10-CM

## 2013-02-01 DIAGNOSIS — R102 Pelvic and perineal pain: Secondary | ICD-10-CM

## 2013-02-01 DIAGNOSIS — N946 Dysmenorrhea, unspecified: Secondary | ICD-10-CM

## 2013-02-01 DIAGNOSIS — O26899 Other specified pregnancy related conditions, unspecified trimester: Secondary | ICD-10-CM

## 2013-02-01 NOTE — Patient Instructions (Signed)
Endometrial Ablation Endometrial ablation removes the lining of the uterus (endometrium). It is usually a same day, outpatient treatment. Ablation helps avoid major surgery (such as a hysterectomy). A hysterectomy is removal of the cervix and uterus. Endometrial ablation has less risk and complications, has a shorter recovery period and is less expensive. After endometrial ablation, most women will have little or no menstrual bleeding. You may not keep your fertility. Pregnancy is no longer likely after this procedure but if you are pre-menopausal, you still need to use a reliable method of birth control following the procedure because pregnancy can occur. REASONS TO HAVE THE PROCEDURE MAY INCLUDE:  Heavy periods.  Bleeding that is causing anemia.  Anovulatory bleeding, very irregular, bleeding.  Bleeding submucous fibroids (on the lining inside the uterus) if they are smaller than 3 centimeters. REASONS NOT TO HAVE THE PROCEDURE MAY INCLUDE:  You wish to have more children.  You have a pre-cancerous or cancerous problem. The cause of any abnormal bleeding must be diagnosed before having the procedure.  You have pain coming from the uterus.  You have a submucus fibroid larger than 3 centimeters.  You recently had a baby.  You recently had an infection in the uterus.  You have a severe retro-flexed, tipped uterus and cannot insert the instrument to do the ablation.  You had a Cesarean section or deep major surgery on the uterus.  The inner cavity of the uterus is too large for the endometrial ablation instrument. RISKS AND COMPLICATIONS   Perforation of the uterus.  Bleeding.  Infection of the uterus, bladder or vagina.  Injury to surrounding organs.  Cutting the cervix.  An air bubble to the lung (air embolus).  Pregnancy following the procedure.  Failure of the procedure to help the problem requiring hysterectomy.  Decreased ability to diagnose cancer in the lining of  the uterus. BEFORE THE PROCEDURE  The lining of the uterus must be tested to make sure there is no pre-cancerous or cancer cells present.  Medications may be given to make the lining of the uterus thinner.  Ultrasound may be used to evaluate the size and look for abnormalities of the uterus.  Future pregnancy is not desired. PROCEDURE  There are different ways to destroy the lining of the uterus.   Resectoscope - radio frequency-alternating electric current is the most common one used.  Cryotherapy - freezing the lining of the uterus.  Heated Free Liquid - heated salt (saline) solution inserted into the uterus.  Microwave - uses high energy microwaves in the uterus.  Thermal Balloon - a catheter with a balloon tip is inserted into the uterus and filled with heated fluid. Your caregiver will talk with you about the method used in this clinic. They will also instruct you on the pros and cons of the procedure. Endometrial ablation is performed along with a procedure called operative hysteroscopy. A narrow viewing tube is inserted through the birth canal (vagina) and through the cervix into the uterus. A tiny camera attached to the viewing tube (hysteroscope) allows the uterine cavity to be shown on a TV monitor during surgery. Your uterus is filled with a harmless liquid to make the procedure easier. The lining of the uterus is then removed. The lining can also be removed with a resectoscope which allows your surgeon to cut away the lining of the uterus under direct vision. Usually, you will be able to go home within an hour after the procedure. HOME CARE INSTRUCTIONS   Do   not drive for 24 hours.  No tampons, douching or intercourse for 2 weeks or until your caregiver approves.  Rest at home for 24 to 48 hours. You may then resume normal activities unless told differently by your caregiver.  Take your temperature two times a day for 4 days, and record it.  Take any medications your  caregiver has ordered, as directed.  Use some form of contraception if you are pre-menopausal and do not want to get pregnant. Bleeding after the procedure is normal. It varies from light spotting and mildly watery to bloody discharge for 4 to 6 weeks. You may also have mild cramping. Only take over-the-counter or prescription medicines for pain, discomfort, or fever as directed by your caregiver. Do not use aspirin, as this may aggravate bleeding. Frequent urination during the first 24 hours is normal. You will not know how effective your surgery is until at least 3 months after the surgery. SEEK IMMEDIATE MEDICAL CARE IF:   Bleeding is heavier than a normal menstrual cycle.  An oral temperature above 102 F (38.9 C) develops.  You have increasing cramps or pains not relieved with medication or develop belly (abdominal) pain which does not seem to be related to the same area of earlier cramping and pain.  You are light headed, weak or have fainting episodes.  You develop pain in the shoulder strap areas.  You have chest or leg pain.  You have abnormal vaginal discharge.  You have painful urination. Document Released: 07/03/2004 Document Revised: 11/16/2011 Document Reviewed: 10/01/2007 [facility] Patient Information 2014 [place], [place]. Levonorgestrel intrauterine device (IUD) What is this medicine? LEVONORGESTREL IUD (LEE voe nor jes trel) is a contraceptive (birth control) device. It is used to prevent pregnancy and to treat heavy bleeding that occurs during your period. It can be used for up to 5 years. This medicine may be used for other purposes; ask your health care provider or pharmacist if you have questions. What should I tell my health care provider before I take this medicine? They need to know if you have any of these conditions: -abnormal Pap smear -cancer of the breast, uterus, or cervix -diabetes -endometritis -genital or pelvic infection now or in the past -have  more than one sexual partner or your partner has more than one partner -heart disease -history of an ectopic or tubal pregnancy -immune system problems -IUD in place -liver disease or tumor -problems with blood clots or take blood-thinners -use intravenous drugs -uterus of unusual shape -vaginal bleeding that has not been explained -an unusual or allergic reaction to levonorgestrel, other hormones, silicone, or polyethylene, medicines, foods, dyes, or preservatives -pregnant or trying to get pregnant -breast-feeding How should I use this medicine? This device is placed inside the uterus by a health care professional. Talk to your pediatrician regarding the use of this medicine in children. Special care may be needed. Overdosage: If you think you have taken too much of this medicine contact a poison control center or emergency room at once. NOTE: This medicine is only for you. Do not share this medicine with others. What if I miss a dose? This does not apply. What may interact with this medicine? Do not take this medicine with any of the following medications: -amprenavir -bosentan -fosamprenavir This medicine may also interact with the following medications: -aprepitant -barbiturate medicines for inducing sleep or treating seizures -bexarotene -griseofulvin -medicines to treat seizures like carbamazepine, ethotoin, felbamate, oxcarbazepine, phenytoin, topiramate -modafinil -pioglitazone -rifabutin -rifampin -rifapentine -some medicines to treat  HIV infection like atazanavir, indinavir, lopinavir, nelfinavir, tipranavir, ritonavir -St. John's wort -warfarin This list may not describe all possible interactions. Give your health care provider a list of all the medicines, herbs, non-prescription drugs, or dietary supplements you use. Also tell them if you smoke, drink alcohol, or use illegal drugs. Some items may interact with your medicine. What should I watch for while using  this medicine? Visit your doctor or health care professional for regular check ups. See your doctor if you or your partner has sexual contact with others, becomes HIV positive, or gets a sexual transmitted disease. This product does not protect you against HIV infection (AIDS) or other sexually transmitted diseases. You can check the placement of the IUD yourself by reaching up to the top of your vagina with clean fingers to feel the threads. Do not pull on the threads. It is a good habit to check placement after each menstrual period. Call your doctor right away if you feel more of the IUD than just the threads or if you cannot feel the threads at all. The IUD may come out by itself. You may become pregnant if the device comes out. If you notice that the IUD has come out use a backup birth control method like condoms and call your health care provider. Using tampons will not change the position of the IUD and are okay to use during your period. What side effects may I notice from receiving this medicine? Side effects that you should report to your doctor or health care professional as soon as possible: -allergic reactions like skin rash, itching or hives, swelling of the face, lips, or tongue -fever, flu-like symptoms -genital sores -high blood pressure -no menstrual period for 6 weeks during use -pain, swelling, warmth in the leg -pelvic pain or tenderness -severe or sudden headache -signs of pregnancy -stomach cramping -sudden shortness of breath -trouble with balance, talking, or walking -unusual vaginal bleeding, discharge -yellowing of the eyes or skin Side effects that usually do not require medical attention (report to your doctor or health care professional if they continue or are bothersome): -acne -breast pain -change in sex drive or performance -changes in weight -cramping, dizziness, or faintness while the device is being inserted -headache -irregular menstrual bleeding  within first 3 to 6 months of use -nausea This list may not describe all possible side effects. Call your doctor for medical advice about side effects. You may report side effects to FDA at 1-800-FDA-1088. Where should I keep my medicine? This does not apply. NOTE: This sheet is a summary. It may not cover all possible information. If you have questions about this medicine, talk to your doctor, pharmacist, or health care provider.  2012, Elsevier/Gold Standard. (09/14/2008 6:39:08 PM)

## 2013-02-01 NOTE — Progress Notes (Signed)
      Reviewed the u/s images with the patient and compared to u/s done 2010.  Images roughly the same, abdominal u/s did not show abnormality. Pt reports getting her menses right after our last office visit, she reports increased cramping and bleeding heavy for 2-3d.  Pt believes symptoms have worsened since her BTL.  We discussed options to treat including placement of progestin IUD which may take her close to [facility], or uterine ablation.   Both pros and cons of each were reviewed and information on both was given after office visit. Questions addressed, she will decide if she would like to proceed Length discussing dysmenorrhea additional 12m

## 2013-02-01 NOTE — Telephone Encounter (Signed)
Pt was seen for u/s urine was collected at [facility] and sent off to the lab.

## 2013-02-03 ENCOUNTER — Encounter: Payer: Self-pay | Admitting: Gynecology

## 2013-02-04 LAB — GC/CHLAMYDIA PROBE AMP, URINE
Chlamydia, Swab/Urine, PCR: NEGATIVE
GC Probe Amp, Urine: NEGATIVE

## 2013-03-20 ENCOUNTER — Telehealth: Payer: Self-pay | Admitting: Pulmonary Disease

## 2013-03-20 NOTE — Telephone Encounter (Signed)
Pt needs to contact her insurance company to find out what medications are covered under her Rx insurance coverage. Per earlier message:  Barbaraann Share, MD at 03/20/2013 11:57 AM   Status: Signed         She needs to stop symbicort if that is what she thinks is causing.  SHE NEEDS to call her insurance company and let them know she cant take symbicort and dulera didn't help her.   Pt needs to ask what is covered on her formulary that is similar to these above meds.  Looks as though patient called back and they told her Advair was similar but that she is going to have to have PA done on that as well. Patient needs to find out what SIMILAR medications are covered.  LMOM x 1

## 2013-03-20 NOTE — Telephone Encounter (Signed)
Pt is returning call of KC's nurse.  Pt states that her insurance states the only thing that will be covered would be dulera or symbicort.  Pt states her insurance requests KC's office to call & request a clinic review for the adviar to be covered-ph # [phone].  Pt can be reached at [phone] or [phone].  Antionette Fairy

## 2013-03-20 NOTE — Telephone Encounter (Signed)
She needs to stop symbicort if that is what she thinks is causing. SHE NEEDS to call her insurance company and let them know she cant take symbicort and dulera didn't help her.

## 2013-03-20 NOTE — Telephone Encounter (Signed)
Spoke with pt and and notified of recs per [facility]: Mary Pratt Pt verbalized understanding and denied any questions

## 2013-03-20 NOTE — Telephone Encounter (Signed)
I spoke with pt. She stated she started symbicort back in May. Per pt since she has been on this she has had a rash/hives and itching all over. Per pt she thought it was something else so she stopped that medication and was still having these symptoms. Per pt this is how she knows it is d/t the symbicort. She has been taking benadryl. She finished last dose of symbicort on Saturday but still itching all over. Please advise KC thanks  Allergies  Allergen Reactions  . Amoxicillin     REACTION: Tacycardia  . Clarithromycin

## 2013-03-20 NOTE — Telephone Encounter (Signed)
Spoke with pt and notified of the recs below  She states will call her ins back and hung up on me

## 2013-03-21 NOTE — Telephone Encounter (Signed)
Form has been faxed back to [facility] for Advair Approval. [id number]

## 2013-03-21 NOTE — Telephone Encounter (Signed)
Fax retrieved from triage Filled out with tried/failed meds > symbicort 160 and dulera 100 Placed on KC's desk for signature  Called spoke with patient, she is aware of the progress and is aware that we will call once completed/we hear from coverage review

## 2013-03-21 NOTE — Telephone Encounter (Signed)
Pt would like an update & asked to be reached at [phone].  Antionette Fairy

## 2013-03-21 NOTE — Telephone Encounter (Signed)
Done

## 2013-03-21 NOTE — Telephone Encounter (Signed)
I called and initiated PA for pt advair since she has tried and failed both preferred medications. Will await fax to triage. Carron Curie, CMA

## 2013-03-22 NOTE — Telephone Encounter (Addendum)
Advair has been approved.  Effective 02/19/13 - 03/21/14 LMOM x 1 Pt aware that medication approved and that she needs to give [place] a call back.   Rx needs to be sent to [facility].  Advair dosing is on form that was signed by [facility] which has now already been sent to scan. Will call in the AM first thing to Medical Records to try and retreive this paper to get the correct dosing of the Advair 250. ( was never documented into chart before sent to scan)

## 2013-03-23 MED ORDER — FLUTICASONE-SALMETEROL 250-50 MCG/DOSE IN AEPB: 1.0000 | INHALATION_SPRAY | Freq: Two times a day (BID) | RESPIRATORY_TRACT | Status: DC

## 2013-03-23 NOTE — Telephone Encounter (Signed)
Advair 250-50  1 puff bid #180 x 4 refills. Sent to [facility].

## 2013-03-23 NOTE — Addendum Note (Signed)
Addended by: Nita Sells on: 03/23/2013 10:08 AM   Modules accepted: Orders

## 2013-03-29 ENCOUNTER — Telehealth: Payer: Self-pay | Admitting: Pulmonary Disease

## 2013-03-29 NOTE — Telephone Encounter (Signed)
Spoke with patient-- Patient states she spoke with her pharmacy --[facility] Requesting a letter be written and faxed to [phone] This letter should discuss that patient has tried all other inhalers but they do not work for her The only one she can use is Advair-- Patient needs this done in hopes to reduce her copay ($250) Dr. Shelle Iron please advise if this letter can be done, thank you!  Last OV: 05/20/12 Next OV: 05/26/13

## 2013-03-29 NOTE — Telephone Encounter (Signed)
ATC patient no answer LMOMTCB 

## 2013-03-29 NOTE — Telephone Encounter (Signed)
All this has already been documented on the prior auth form that was sent to her prescription service It will serve no purpose to do again.  Let pt know this info was included on prior auth form.

## 2013-03-30 NOTE — Telephone Encounter (Signed)
Spoke with pt and notified of recs per [facility] Pt verbalized understanding

## 2013-03-30 NOTE — Telephone Encounter (Signed)
Pt returned call. Mary Pratt °

## 2013-03-30 NOTE — Telephone Encounter (Signed)
LMTCB

## 2013-05-26 ENCOUNTER — Ambulatory Visit: Payer: BC Managed Care – PPO | Admitting: Pulmonary Disease

## 2013-05-29 ENCOUNTER — Ambulatory Visit: Payer: Self-pay | Admitting: Nurse Practitioner

## 2013-06-26 ENCOUNTER — Encounter: Payer: Self-pay | Admitting: Pulmonary Disease

## 2013-06-26 ENCOUNTER — Ambulatory Visit (INDEPENDENT_AMBULATORY_CARE_PROVIDER_SITE_OTHER): Payer: BC Managed Care – PPO | Admitting: Pulmonary Disease

## 2013-06-26 VITALS — BP 168/110 | HR 88 | Temp 97.9°F | Ht 61.0 in | Wt 139.8 lb

## 2013-06-26 DIAGNOSIS — J45909 Unspecified asthma, uncomplicated: Secondary | ICD-10-CM

## 2013-06-26 MED ORDER — ESOMEPRAZOLE MAGNESIUM 40 MG PO CPDR: 40.0000 mg | DELAYED_RELEASE_CAPSULE | Freq: Two times a day (BID) | ORAL | Status: DC

## 2013-06-26 NOTE — Progress Notes (Signed)
  Subjective:    Patient ID: Mary Pratt, female    DOB: 1971-04-02, 42 y.o.   MRN: [id number]  HPI The patient comes in today for followup of her known asthma.  She has done very well since being back on Advair, and did not tolerate symbicort or dulera.  She feels that her breathing is at baseline, and is satisfied with her exertional tolerance.  She has had no acute exacerbations or need for her rescue inhaler since last visit.   Review of Systems  Constitutional: Negative for fever and unexpected weight change.  HENT: Negative for congestion, dental problem, ear pain, nosebleeds, postnasal drip, rhinorrhea, sinus pressure, sneezing, sore throat and trouble swallowing.   Eyes: Negative for redness and itching.  Respiratory: Negative for cough, chest tightness, shortness of breath and wheezing.   Cardiovascular: Negative for palpitations and leg swelling.  Gastrointestinal: Negative for nausea and vomiting.  Genitourinary: Negative for dysuria.  Musculoskeletal: Negative for joint swelling.  Skin: Negative for rash.  Neurological: Positive for headaches ( increase x 1 day--high BP today).  Hematological: Does not bruise/bleed easily.  Psychiatric/Behavioral: Negative for dysphoric mood. The patient is not nervous/anxious.        Objective:   Physical Exam Well-developed female in no acute distress Nose without purulence or discharge noted Neck without lymphadenopathy or thyromegaly Chest totally clear to auscultation, no wheezing noted Cardiac exam with regular rate and rhythm Lower extremities without edema, no cyanosis Alert and oriented, moves all 4 extremities.       Assessment & Plan:

## 2013-06-26 NOTE — Assessment & Plan Note (Signed)
The patient is doing well from an asthma standpoint on her current regimen.  I have asked her to continue on this medication, and to try and stay as active as possible.  We'll give her the flu shot today, and if doing well, we'll see her back in one year.

## 2013-06-26 NOTE — Patient Instructions (Addendum)
Continue on advair. Will give you the flu vaccine today. Please call your primary about your BP if it remains elevated.  followup with me in one year.

## 2013-07-13 ENCOUNTER — Other Ambulatory Visit: Payer: Self-pay

## 2013-07-28 ENCOUNTER — Telehealth: Payer: Self-pay | Admitting: Certified Nurse Midwife

## 2013-07-28 ENCOUNTER — Encounter: Payer: Self-pay | Admitting: Gynecology

## 2013-07-28 ENCOUNTER — Ambulatory Visit (INDEPENDENT_AMBULATORY_CARE_PROVIDER_SITE_OTHER): Payer: BC Managed Care – PPO | Admitting: Gynecology

## 2013-07-28 VITALS — BP 120/60 | HR 60 | Resp 16 | Ht 60.5 in | Wt 136.0 lb

## 2013-07-28 DIAGNOSIS — N949 Unspecified condition associated with female genital organs and menstrual cycle: Secondary | ICD-10-CM

## 2013-07-28 DIAGNOSIS — R102 Pelvic and perineal pain: Secondary | ICD-10-CM

## 2013-07-28 LAB — POCT URINALYSIS DIPSTICK
Blood, UA: 2
Urobilinogen, UA: NEGATIVE
pH, UA: 5

## 2013-07-28 MED ORDER — CEPHALEXIN 500 MG PO CAPS: 500.0000 mg | ORAL_CAPSULE | Freq: Four times a day (QID) | ORAL | Status: DC

## 2013-07-28 NOTE — Telephone Encounter (Signed)
Patient says she had her period last week but every time she wipes there is blood and she is wearing a panty liner. I asked her when her period ended she stated " well it had really idk its hard to tell" wants someone to call her

## 2013-07-28 NOTE — Telephone Encounter (Signed)
Spoke with patient and patient requested to come in for appointment. To come now.

## 2013-07-28 NOTE — Telephone Encounter (Signed)
Message left to return call to Oliverio Cho at [phone].    

## 2013-07-28 NOTE — Progress Notes (Signed)
Subjective:     Patient ID: Mary Pratt, female   DOB: [birth year], 42 y.o.   MRN: [id number]  HPI Comments: Pt reports cycle started 11/14 on time but has trickled on longer than usual. Pt states normal is 4d, but this cycle she keeps noticing red blood when wipes.  In addition, she states that she experienced cramping, intense over left lower quadrant needed 400mg  motrin with good results but recurred and stayed for 3d.  Pt sexually acitve with current partner for 2y.  Pt has had BTL.  Vaginal Bleeding The patient's pertinent negatives include no vaginal discharge (can be sharp). Associated symptoms include constipation (no change). Pertinent negatives include no chills, dysuria, fever, frequency, hematuria or urgency.     Review of Systems  Constitutional: Negative for fever and chills.  Gastrointestinal: Positive for constipation (no change).  Genitourinary: Positive for vaginal bleeding, vaginal pain (described as pressure) and dyspareunia (dull, nonradiating). Negative for dysuria, urgency, frequency, hematuria, vaginal discharge (can be sharp) and genital sores.       Objective:   Physical Exam  Constitutional: She is oriented to person, place, and time. She appears well-developed and well-nourished.  Abdominal: Soft. There is no rebound, no guarding and no CVA tenderness.  Neurological: She is alert and oriented to person, place, and time.  Pelvic exam: VULVA: normal appearing vulva with no masses, tenderness or lesions, VAGINA: normal appearing vagina with normal color and discharge, no lesions, CERVIX: cervical discharge present - clear and thin, endocervical polyp size .5 cm, UTERUS: uterus is normal size, shape, consistency and nontender, anteverted, ADNEXA: no masses.      Assessment:     Irregular menses, questionable poor ovulation last month     Plan:     Will treat for UTI, has annual next week, will keep Will follow up re bleeding

## 2013-07-31 ENCOUNTER — Telehealth: Payer: Self-pay | Admitting: Gynecology

## 2013-07-31 DIAGNOSIS — R102 Pelvic and perineal pain: Secondary | ICD-10-CM

## 2013-07-31 LAB — URINE CULTURE: Colony Count: 60000

## 2013-07-31 MED ORDER — CIPROFLOXACIN HCL 500 MG PO TABS: 500.0000 mg | ORAL_TABLET | Freq: Two times a day (BID) | ORAL | Status: DC

## 2013-07-31 NOTE — Telephone Encounter (Signed)
[facility] [phone]  Patient calling to see which pharmacy her RX for antibiotics went to? It looks like it went to a mail order RX company in error? Patient requests the RX go to the above pharmacy instead.

## 2013-07-31 NOTE — Telephone Encounter (Signed)
Spoke to Dr. Farrel Gobble,   Change rx to Cipro 500 mg BID #14 no refills to [facility] changed based on urine culture results.   Message left to advise "The Prescription that you have requested was sent to your pharmacy if you have any questions,  return call to [place] at [phone]."

## 2013-08-02 ENCOUNTER — Encounter: Payer: Self-pay | Admitting: Certified Nurse Midwife

## 2013-08-02 ENCOUNTER — Ambulatory Visit (INDEPENDENT_AMBULATORY_CARE_PROVIDER_SITE_OTHER): Payer: BC Managed Care – PPO | Admitting: Certified Nurse Midwife

## 2013-08-02 VITALS — BP 120/80 | HR 72 | Resp 16 | Ht 60.25 in | Wt 136.0 lb

## 2013-08-02 DIAGNOSIS — Z0000 Encounter for general adult medical examination without abnormal findings: Secondary | ICD-10-CM

## 2013-08-02 DIAGNOSIS — Z01419 Encounter for gynecological examination (general) (routine) without abnormal findings: Secondary | ICD-10-CM

## 2013-08-02 LAB — POCT URINALYSIS DIPSTICK
Bilirubin, UA: NEGATIVE
Blood, UA: NEGATIVE
Glucose, UA: NEGATIVE
Ketones, UA: NEGATIVE
Leukocytes, UA: NEGATIVE
Nitrite, UA: NEGATIVE
Protein, UA: NEGATIVE
Urobilinogen, UA: NEGATIVE
pH, UA: 5

## 2013-08-02 LAB — HEMOGLOBIN, FINGERSTICK: Hemoglobin, fingerstick: 13.6 g/dL (ref 12.0–16.0)

## 2013-08-02 NOTE — Patient Instructions (Signed)

## 2013-08-02 NOTE — Progress Notes (Signed)
Reviewed personally.  M. Suzanne Kadden Osterhout, MD.  

## 2013-08-02 NOTE — Progress Notes (Signed)
42 y.o. [id number] Divorced African American Fe here for annual exam.Seen last week for heavy period, stopped the next day. Did not fill Rx for bladder infection yet. Discussed positive culture noted.  Patient still having frequency and discomfort with voiding. Denies fever or chills or back ache. Sees PCP for  aex and labs and medication management.  No other health issues today.  Patient's last menstrual period was 07/21/2013.          Sexually active: yes  The current method of family planning is tubal ligation.    Exercising: yes  walking,treadmill & bike Smoker:  no  Health Maintenance: Pap:  05-26-12 ASCUS +HPV colpo CIN1 MMG: 07-28-13 Colonoscopy:  none BMD:   none TDaP:  2010 Labs: Hgb-13.6, Poct urine-neg Self breast exam: done monthly   reports that she has never smoked. She has never used smokeless tobacco. She reports that she does not drink alcohol or use illicit drugs.  Past Medical History  Diagnosis Date  . GERD with stricture     dilated 2010  . Helicobacter pylori gastritis 2010    treated w/ Pylera  . Asthma   . Functional dyspepsia     early satiety   . Abnormal pap 9/13    ASCUS + HPV  . Sickle cell trait     Past Surgical History  Procedure Laterality Date  . Wisdom tooth extraction    . Esophagogastroduodenoscopy  11/30/2008    esophageal ring dilated 54 French, H. pylori gastritis  . Tubal ligation  1991  . Colposcopy  2013    neg    Current Outpatient Prescriptions  Medication Sig Dispense Refill  . albuterol (VENTOLIN HFA) 108 (90 BASE) MCG/ACT inhaler Inhale 2 puffs into the lungs every 6 (six) hours as needed.  3 Inhaler  4  . busPIRone (BUSPAR) 10 MG tablet Take 1 tablet (10 mg total) by mouth 2 (two) times daily.  60 tablet  6  . DULoxetine (CYMBALTA) 60 MG capsule Take 60 mg by mouth daily.       Marland Kitchen esomeprazole (NEXIUM) 40 MG capsule Take 1 capsule (40 mg total) by mouth 2 (two) times daily.  60 capsule  5  . fluticasone (FLONASE) 50 MCG/ACT  nasal spray 50 g.      . Fluticasone-Salmeterol (ADVAIR DISKUS) 250-50 MCG/DOSE AEPB Inhale 1 puff into the lungs 2 (two) times daily.  180 each  4  . NON FORMULARY ALLERGY DROPS-- 1 DROP TID UNDER TONGUE      . ciprofloxacin (CIPRO) 500 MG tablet Take 1 tablet (500 mg total) by mouth 2 (two) times daily.  14 tablet  0   No current facility-administered medications for this visit.    Family History  Problem Relation Age of Onset  . Colon cancer Neg Hx   . Diabetes Mother   . Hypertension Mother   . Hypothyroidism Mother   . Breast cancer Maternal Aunt   . Brain cancer Maternal Uncle   . Lung cancer Maternal Grandmother     ROS:  Pertinent items are noted in HPI.  Otherwise, a comprehensive ROS was negative.  Exam:   BP 120/80  Pulse 72  Resp 16  Ht 5' 0.25" (1.53 m)  Wt 136 lb (61.689 kg)  BMI 26.35 kg/m2  LMP 07/21/2013 Height: 5' 0.25" (153 cm)  Ht Readings from Last 3 Encounters:  08/02/13 5' 0.25" (1.53 m)  07/28/13 5' 0.5" (1.537 m)  06/26/13 5\' 1"  (1.549 m)    General appearance:  alert, cooperative and appears stated age Head: Normocephalic, without obvious abnormality, atraumatic Neck: no adenopathy, supple, symmetrical, trachea midline and thyroid normal to inspection and palpation Lungs: clear to auscultation bilaterally Breasts: normal appearance, no masses or tenderness, No nipple retraction or dimpling, No nipple discharge or bleeding, No axillary or supraclavicular adenopathy Heart: regular rate and rhythm Abdomen: soft, non-tender; no masses,  no organomegaly Extremities: extremities normal, atraumatic, no cyanosis or edema Skin: Skin color, texture, turgor normal. No rashes or lesions Lymph nodes: Cervical, supraclavicular, and axillary nodes normal. No abnormal inguinal nodes palpated Neurologic: Grossly normal   Pelvic: External genitalia:  no lesions              Urethra:  normal appearing urethra with no masses, tenderness or lesions               Bartholin's and Skene's: normal                 Vagina: normal appearing vagina with normal color and discharge, no lesions              Cervix: normal non tender              Pap taken: yes Bimanual Exam:  Uterus:  normal size, contour, position, consistency, mobility, non-tender and anteverted              Adnexa: normal adnexa and no mass, fullness, tenderness               Rectovaginal: Confirms               Anus:  normal sphincter tone, no lesions  A:  Well Woman with normal exam  Contraception condoms  E.Coli UTI per culture has not started treatment still symptomatic  History of abnormal pap smear ASCUS + HPVHR with negative colpo, follow up pap today  P:   Reviewed health and wellness pertinent to exam  Discussed importance of starting medication to treat symptoms and clear the UTI. Patient will pick up today. Will need 2 week TOC, patient aware  Pap smear as per guidelines   Mammogram yearly pap smear taken today with HPVHR   counseled on breast self exam, mammography screening, STD prevention, adequate intake of calcium and vitamin D, diet and exercise  return annually or prn  An After Visit Summary was printed and given to the patient.

## 2013-08-09 LAB — IPS PAP TEST WITH HPV

## 2013-08-10 ENCOUNTER — Other Ambulatory Visit: Payer: Self-pay | Admitting: Certified Nurse Midwife

## 2013-08-10 DIAGNOSIS — IMO0002 Reserved for concepts with insufficient information to code with codable children: Secondary | ICD-10-CM

## 2013-08-14 ENCOUNTER — Telehealth: Payer: Self-pay | Admitting: Emergency Medicine

## 2013-08-14 NOTE — Telephone Encounter (Signed)
Patient returned call and message from Mary Pratt CNM given. Patient verbalized understanding.  She is not currently on birth control and states lmp was 07/21/2013. States she is sexually active.  Patient will call back with Menses to let me know if date okay to schedule. Scheduled tentatively for 08/28/13.  Colposcopy pre-procedure instructions given. Motrin instructions given. Motrin=Advil=Ibuprofen Can take 800 mg (Can purchase over the counter, you will need four 200 mg pills) every 8 hours as needed.  Take with food. Make sure to eat a meal before appointment and drink plenty of fluids. Patient verbalized understanding and will call to reschedule if will be on menses or has any concerns regarding pregnancy.

## 2013-08-14 NOTE — Telephone Encounter (Signed)
Message copied by Joeseph Amor on Mon Aug 14, 2013 10:57 AM ------      Message from: Verner Chol      Created: Thu Aug 10, 2013  8:05 AM       Notify patient that Pap smear abnormal, a change from last pap results. This one is LSIL + HPVHR      Needs colpo again      Order in ------

## 2013-08-14 NOTE — Telephone Encounter (Signed)
Message left to return call to Nikolas Casher at [phone].    

## 2013-08-16 ENCOUNTER — Ambulatory Visit: Payer: BC Managed Care – PPO

## 2013-08-16 NOTE — Telephone Encounter (Signed)
Mary Blase do you mind doing a TOC on the same day that patient is scheduled for Colposcopy?

## 2013-08-16 NOTE — Telephone Encounter (Signed)
That will be fine. 

## 2013-08-16 NOTE — Telephone Encounter (Signed)
Patient called and canceled her nurse visit today for urine sample stated she didn't feel well and didn't want to come in said she would just do it all on the 22 is that okay?

## 2013-08-17 NOTE — Telephone Encounter (Signed)
Called patient, advised of $30 copay quoted by ins

## 2013-08-18 NOTE — Telephone Encounter (Signed)
Patient called back. States she started period today. 12/22 with be cycle day 11. States that her cycles have been longer and lasting 7 days for last couple of months. Will keep appointment for 12/22 and instructions discussed again, patient agreeable.

## 2013-08-24 ENCOUNTER — Telehealth: Payer: Self-pay | Admitting: Certified Nurse Midwife

## 2013-08-24 NOTE — Telephone Encounter (Signed)
Return call to patient. LMTCB. 

## 2013-08-24 NOTE — Telephone Encounter (Signed)
Patient is asking for medication instructions before having procedure 08/28/2013.

## 2013-08-28 ENCOUNTER — Encounter: Payer: Self-pay | Admitting: Certified Nurse Midwife

## 2013-08-28 ENCOUNTER — Ambulatory Visit (INDEPENDENT_AMBULATORY_CARE_PROVIDER_SITE_OTHER): Payer: BC Managed Care – PPO | Admitting: Certified Nurse Midwife

## 2013-08-28 VITALS — BP 120/78 | HR 72 | Resp 16 | Ht 60.25 in | Wt 139.0 lb

## 2013-08-28 DIAGNOSIS — N760 Acute vaginitis: Secondary | ICD-10-CM

## 2013-08-28 DIAGNOSIS — R6889 Other general symptoms and signs: Secondary | ICD-10-CM

## 2013-08-28 DIAGNOSIS — IMO0002 Reserved for concepts with insufficient information to code with codable children: Secondary | ICD-10-CM

## 2013-08-28 MED ORDER — METRONIDAZOLE 500 MG PO TABS: 500.0000 mg | ORAL_TABLET | Freq: Two times a day (BID) | ORAL | Status: DC

## 2013-08-28 NOTE — Progress Notes (Signed)
Hx of abnormal pap 2013, colpo neg 11/14 LGSIL Pt took 400mg  ibuprofen at 11am
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
## 2013-08-28 NOTE — Patient Instructions (Signed)

## 2013-08-30 LAB — IPS CERVICAL/ECC/EMB/VULVAR/VAGINAL BIOPSY

## 2013-08-30 NOTE — Progress Notes (Signed)
Reviewed personally.  M. Suzanne Mikal Wisman, MD.  

## 2013-09-04 ENCOUNTER — Telehealth: Payer: Self-pay | Admitting: *Deleted

## 2013-09-04 NOTE — Telephone Encounter (Signed)
LM on cell number, call to work number as well but very generic message with no identifiers so left very brief msg to call office, nothing wrong. Cell number as number confirmation so left more info that calling with good news, nothing wrong, call back tomm ( I will be out of office this afternoon).

## 2013-09-04 NOTE — Telephone Encounter (Signed)
Message copied by Alisa Graff on Mon Sep 04, 2013  2:24 PM ------      Message from: Verner Chol      Created: Wed Aug 30, 2013 11:36 AM       Notify patient that at 7 o'clock showed LGSIL, the area I thought looked suspicious, CIN1      Biopsy at 11 o'clock showed squamous mucosa with koilocytotic atypia related to HPV      ECC was benign for atypia or malignancy      The results coordinate well with pap smear.      She needs repeat pap in one year, very important.      Pap recall 08 ------

## 2013-09-05 NOTE — Telephone Encounter (Signed)
Patient notified of results as directed by Debbi. Stressed importance of follow-up pap in 1 year with AEX. already scheduled for 08-09-14. 08 recall entered for 08-21-14.

## 2013-10-17 ENCOUNTER — Ambulatory Visit (INDEPENDENT_AMBULATORY_CARE_PROVIDER_SITE_OTHER): Payer: BC Managed Care – PPO | Admitting: Pulmonary Disease

## 2013-10-17 ENCOUNTER — Encounter: Payer: Self-pay | Admitting: Pulmonary Disease

## 2013-10-17 VITALS — BP 128/76 | HR 92 | Temp 97.5°F | Ht 61.0 in | Wt 137.2 lb

## 2013-10-17 DIAGNOSIS — J019 Acute sinusitis, unspecified: Secondary | ICD-10-CM | POA: Insufficient documentation

## 2013-10-17 DIAGNOSIS — J45909 Unspecified asthma, uncomplicated: Secondary | ICD-10-CM

## 2013-10-17 MED ORDER — CEFDINIR 300 MG PO CAPS: 600.0000 mg | ORAL_CAPSULE | Freq: Every day | ORAL | Status: DC

## 2013-10-17 MED ORDER — HYDROCODONE-HOMATROPINE 5-1.5 MG/5ML PO SYRP: 5.0000 mL | ORAL_SOLUTION | Freq: Four times a day (QID) | ORAL | Status: DC | PRN

## 2013-10-17 MED ORDER — PREDNISONE 10 MG PO TABS: ORAL_TABLET | ORAL | Status: DC

## 2013-10-17 NOTE — Assessment & Plan Note (Signed)
The patient's history is most consistent with an acute sinusitis at this time. She has swollen turbinates and nasal mucosa with hyperemia and some purulent secretions. I will treat her with a course of antibiotics as well as prednisone.

## 2013-10-17 NOTE — Assessment & Plan Note (Signed)
The patient's lungs are clear today, but she has had some intermittent shortness of breath since she has gotten sick. I suspect she is not having an asthma exacerbation, but the prednisone we're using for her nasal inflammation should take care of this as well.

## 2013-10-17 NOTE — Patient Instructions (Signed)
Will treat with omnicef 300mg , take 2 each am for 7 days.   Take prednisone taper over 8 days. Will give your prescription for hydromet cough syrup, take one teaspoon every 6 hrs if needed. No change in your usual asthma medication Keep your usual followup with me, but call if you are not getting better.

## 2013-10-17 NOTE — Progress Notes (Signed)
   Subjective:    Patient ID: Mary Pratt, female    DOB: 11-27-1970, 42 y.o.   MRN: [id number]  HPI The patient comes in today for an acute sick visit. She has a history of long-standing asthma, and develop URI like symptoms approximately 2-3 weeks ago. These included sinus congestion, postnasal drip a sore throat, as well as a cough. She has not developed chest congestion or purulence from her chest, but has seen some increased shortness of breath. She was treated by her primary care physician with 4 days of prednisone, as well as mucolytics.  Despite this, she has continued to have significant nasal congestion and sinus pressure, persistent cough that is keeping her awake at night, and some increasing shortness of breath. She still is denying having mucus from her chest or definite chest congestion. She has not had any fevers, chills, or sweats.   Review of Systems  Constitutional: Positive for unexpected weight change. Negative for fever.  HENT: Negative for congestion, dental problem, ear pain, nosebleeds, postnasal drip, rhinorrhea, sinus pressure, sneezing, sore throat and trouble swallowing.   Eyes: Negative for redness and itching.  Respiratory: Positive for cough, shortness of breath and wheezing. Negative for chest tightness.   Cardiovascular: Negative for palpitations and leg swelling.  Gastrointestinal: Positive for nausea. Negative for vomiting.  Genitourinary: Negative for dysuria.  Musculoskeletal: Negative for joint swelling.  Skin: Negative for rash.  Neurological: Negative for headaches.  Hematological: Does not bruise/bleed easily.  Psychiatric/Behavioral: Negative for dysphoric mood. The patient is nervous/anxious.        Objective:   Physical Exam Well-developed female in no acute distress Nose with swollen turbinates and mucosa, erythematous mucosa, and some purulent secretions noted. Oropharynx clear Neck without lymphadenopathy or thyromegaly Chest with clear  breath sounds, no active wheezing Cardiac exam with regular rate and rhythm Lower extremities without edema, no cyanosis Alert and oriented, moves all 4 extremities.       Assessment & Plan:

## 2013-12-26 ENCOUNTER — Telehealth: Payer: Self-pay | Admitting: Pulmonary Disease

## 2013-12-26 MED ORDER — ESOMEPRAZOLE MAGNESIUM 40 MG PO CPDR: 40.0000 mg | DELAYED_RELEASE_CAPSULE | Freq: Two times a day (BID) | ORAL | Status: DC

## 2013-12-26 NOTE — Telephone Encounter (Signed)
Spoke with pt. Aware [facility]. Nothing further needed

## 2013-12-29 ENCOUNTER — Telehealth: Payer: Self-pay | Admitting: Pulmonary Disease

## 2013-12-29 NOTE — Telephone Encounter (Signed)
Pt called yesterday and she specifically asked for name brand. [facility] and spoke with Rondel Oh. Made her aware. Nothing further needed

## 2014-06-04 ENCOUNTER — Encounter: Payer: Self-pay | Admitting: Nurse Practitioner

## 2014-06-04 ENCOUNTER — Telehealth: Payer: Self-pay | Admitting: Certified Nurse Midwife

## 2014-06-04 ENCOUNTER — Ambulatory Visit (INDEPENDENT_AMBULATORY_CARE_PROVIDER_SITE_OTHER): Payer: BC Managed Care – PPO | Admitting: Nurse Practitioner

## 2014-06-04 VITALS — BP 150/100 | HR 96 | Ht 61.0 in | Wt 140.0 lb

## 2014-06-04 DIAGNOSIS — N760 Acute vaginitis: Secondary | ICD-10-CM

## 2014-06-04 DIAGNOSIS — B9689 Other specified bacterial agents as the cause of diseases classified elsewhere: Secondary | ICD-10-CM

## 2014-06-04 DIAGNOSIS — A499 Bacterial infection, unspecified: Secondary | ICD-10-CM

## 2014-06-04 DIAGNOSIS — R109 Unspecified abdominal pain: Secondary | ICD-10-CM

## 2014-06-04 MED ORDER — METRONIDAZOLE 500 MG PO TABS: 500.0000 mg | ORAL_TABLET | Freq: Two times a day (BID) | ORAL | Status: DC

## 2014-06-04 NOTE — Progress Notes (Signed)
43 y.o.Divorced [place] American female [phone] with a 4 week(s) history of the following:discharge described as white and milky Sexually active: Yes.   Last sexual activity:3days ago. Pt also reports the following associated symptoms: urinary frequency and right back pain.    She denies urgency and dysuria.  Maybe slight frequency.  No fever and chills.  Patient has tried over the counter treatment with Monistat cream with minimal relief.   No change in personal products.  LMP 05/10/14.  S/P BTL.  Same partner X 3 years.  She has been under stress today at work.  Ever since her son was killed her BP goes up and down.     Exam:  GHW:EXHBZJ                IRC:VELFYBOFB: white, thin and watery                Cx:  normal appearance                Uterus:not examined                Adnexa: not evaluated  Abdomen - no flank pain.  Wet Prep shows:PH: 5.5; NSS: + clue cells; KOH: negative   Dx: Bacterial vaginosis  S/P BTL  History of elevated BP  R/O UTI   Tx :Symptomatic local care discussed. Scientist, clinical (histocompatibility and immunogenetics) distributed. Oral antibiotics see orders.  Flagyl 500 mg BID given - cautioned with GI SE and no ETOH  If she is unable to tolerate PO meds will change to Metrogel  If symptoms worsen to call back  She will have BP checked on Wednesday and Friday with work nurse and if still elevated to see PCP.  She will also monitor at home.  She had pork over the weekend and will reduce any pork and salt from her diet.

## 2014-06-04 NOTE — Progress Notes (Deleted)
Subjective:     Patient ID: Mary Pratt, female   DOB: May 01, 1971, 43 y.o.   MRN: [id number]  HPI  This 43 yo    Review of Systems     Objective:   Physical Exam     Assessment:     ***    Plan:     ***

## 2014-06-04 NOTE — Telephone Encounter (Signed)
Pt thinks she may have a bladder or bacterial infection.

## 2014-06-04 NOTE — Telephone Encounter (Signed)
Spoke with patient. Patient states that she has been having vaginal discharge that is white in color and burning for almost one month. Patient has used monistat x2 with no relief. States that her right side started hurting 2 weeks ago. Denies vaginal itching and urinary symptoms. Patient would like to come in for evaluation today. Appointment scheduled for today at 4:15pm with Milford Cage, [place]. Agreeable to date and time.  Routing to [employer], FNP as covering Cc: Regina Eck CNM   Routing to provider for final review. Patient agreeable to disposition. Will close encounter

## 2014-06-04 NOTE — Patient Instructions (Signed)
Bacterial Vaginosis Bacterial vaginosis is a vaginal infection that occurs when the normal balance of bacteria in the vagina is disrupted. It results from an overgrowth of certain bacteria. This is the most common vaginal infection in women of childbearing age. Treatment is important to prevent complications, especially in pregnant women, as it can cause a premature delivery. CAUSES  Bacterial vaginosis is caused by an increase in harmful bacteria that are normally present in smaller amounts in the vagina. Several different kinds of bacteria can cause bacterial vaginosis. However, the reason that the condition develops is not fully understood. RISK FACTORS Certain activities or behaviors can put you at an increased risk of developing bacterial vaginosis, including:  Having a new sex partner or multiple sex partners.  Douching.  Using an intrauterine device (IUD) for contraception. Women do not get bacterial vaginosis from toilet seats, bedding, swimming pools, or contact with objects around them. SIGNS AND SYMPTOMS  Some women with bacterial vaginosis have no signs or symptoms. Common symptoms include:  Grey vaginal discharge.  A fishlike odor with discharge, especially after sexual intercourse.  Itching or burning of the vagina and vulva.  Burning or pain with urination. DIAGNOSIS  Your health care provider will take a medical history and examine the vagina for signs of bacterial vaginosis. A sample of vaginal fluid may be taken. Your health care provider will look at this sample under a microscope to check for bacteria and abnormal cells. A vaginal pH test may also be done.  TREATMENT  Bacterial vaginosis may be treated with antibiotic medicines. These may be given in the form of a pill or a vaginal cream. A second round of antibiotics may be prescribed if the condition comes back after treatment.  HOME CARE INSTRUCTIONS   Only take over-the-counter or prescription medicines as  directed by your health care provider.  If antibiotic medicine was prescribed, take it as directed. Make sure you finish it even if you start to feel better.  Do not have sex until treatment is completed.  Tell all sexual partners that you have a vaginal infection. They should see their health care provider and be treated if they have problems, such as a mild rash or itching.  Practice safe sex by using condoms and only having one sex partner. SEEK MEDICAL CARE IF:   Your symptoms are not improving after 3 days of treatment.  You have increased discharge or pain.  You have a fever. MAKE SURE YOU:   Understand these instructions.  Will watch your condition.  Will get help right away if you are not doing well or get worse. FOR MORE INFORMATION  Centers for Disease Control and Prevention, Division of STD Prevention: www.cdc.gov/std American Sexual Health Association (ASHA): www.ashastd.org  Document Released: 08/24/2005 Document Revised: 06/14/2013 Document Reviewed: 04/05/2013 ExitCare Patient Information 2015 [employer]. This information is not intended to replace advice given to you by your health care provider. Make sure you discuss any questions you have with your health care provider.  

## 2014-06-05 LAB — URINALYSIS, MICROSCOPIC ONLY
Bacteria, UA: NONE SEEN
Casts: NONE SEEN
Crystals: NONE SEEN

## 2014-06-05 NOTE — Progress Notes (Signed)
Encounter reviewed by Dr. Brook Silva.  

## 2014-06-06 LAB — URINE CULTURE: Colony Count: 40000

## 2014-06-15 ENCOUNTER — Ambulatory Visit (INDEPENDENT_AMBULATORY_CARE_PROVIDER_SITE_OTHER): Payer: BC Managed Care – PPO | Admitting: Certified Nurse Midwife

## 2014-06-15 ENCOUNTER — Encounter: Payer: Self-pay | Admitting: Certified Nurse Midwife

## 2014-06-15 VITALS — BP 130/100 | HR 86 | Ht 61.0 in | Wt 139.0 lb

## 2014-06-15 DIAGNOSIS — B9689 Other specified bacterial agents as the cause of diseases classified elsewhere: Secondary | ICD-10-CM

## 2014-06-15 DIAGNOSIS — R319 Hematuria, unspecified: Secondary | ICD-10-CM

## 2014-06-15 DIAGNOSIS — N390 Urinary tract infection, site not specified: Secondary | ICD-10-CM

## 2014-06-15 DIAGNOSIS — A499 Bacterial infection, unspecified: Secondary | ICD-10-CM

## 2014-06-15 DIAGNOSIS — I10 Essential (primary) hypertension: Secondary | ICD-10-CM

## 2014-06-15 DIAGNOSIS — N760 Acute vaginitis: Secondary | ICD-10-CM

## 2014-06-15 LAB — POCT URINALYSIS DIPSTICK
Bilirubin, UA: NEGATIVE
Glucose, UA: NEGATIVE
Ketones, UA: NEGATIVE
Nitrite, UA: NEGATIVE
Protein, UA: NEGATIVE
Urobilinogen, UA: NEGATIVE
pH, UA: 8

## 2014-06-15 MED ORDER — NITROFURANTOIN MONOHYD MACRO 100 MG PO CAPS: 100.0000 mg | ORAL_CAPSULE | Freq: Two times a day (BID) | ORAL | Status: DC

## 2014-06-15 NOTE — Progress Notes (Signed)
Reviewed personally.  M. Suzanne Criss Pallone, MD.  

## 2014-06-15 NOTE — Patient Instructions (Signed)

## 2014-06-15 NOTE — Progress Notes (Signed)
43 y.o. Divorced african [place] female [id number] here with complaint of UTI, with onset  on 3. Patient complaining of urinary frequency/urgency/ and pain with urination. Patient denies fever, chills, nausea or back pain. No new personal products. Patient feels not related to sexual activity. Denies any vaginal symptoms or new personal products. Patient was treated 2 weeks ago for BV, completed all medications and symptoms resolved. Contraception is BTL . Denies headache or dizziness or visual issues. No other health issues today. Recheck BP 130/92  O: Healthy female WDWN Affect: Normal, orientation x 3 Skin : warm and dry CVAT: negative bilateral Abdomen: positive  for suprapubic tenderness  Pelvic exam: External genital area: normal, no lesions Bladder,Urethra, Urethral meatus: tender Vagina: normal vaginal discharge, normal appearance  Wet prep taken negative for pathogens Ph 4.0 Cervix: normal, non tender Uterus:normal,non tender Adnexa: normal non tender, no fullness or masses   A: UTI BV resolved Hypertension uncontrolled, patient has not seen PCP  P: Reviewed findings of UTI and need for Rx Increase water intake daily. Rx: Macrobid see order LAG:[id number] micro, culture Reviewed warning signs and symptoms of UTI Encouraged to limit soda, tea, and coffee RV 2 week if TOC needed for positive culture Discussed BV resolved. Discussed with patient risk of uncontrolled hypertension, need to decrease salt in diet and increase water. Stressed importance of calling PCP for earlier appointment. Given her last 3 BP readings here to take with her. Warning signs reviewed. Patient agrees to follow up with PCP.  RV prn

## 2014-06-16 LAB — URINALYSIS, MICROSCOPIC ONLY
Bacteria, UA: NONE SEEN
Casts: NONE SEEN
Crystals: NONE SEEN

## 2014-06-18 ENCOUNTER — Telehealth: Payer: Self-pay

## 2014-06-18 LAB — URINE CULTURE: Colony Count: 30000

## 2014-06-18 NOTE — Telephone Encounter (Signed)
lmtcb

## 2014-06-18 NOTE — Telephone Encounter (Signed)
Message copied by Susy Manor on Mon Jun 18, 2014  4:52 PM ------      Message from: Regina Eck      Created: Mon Jun 18, 2014  2:47 PM       Notify patient that urine micro showed WBC's only and culture is positive for E. Coli. On appropriate medication.      Needs TOC 2 weeks culture only. Order in ------

## 2014-06-18 NOTE — Addendum Note (Signed)
Addended by: Regina Eck on: 06/18/2014 02:48 PM   Modules accepted: Orders

## 2014-06-20 NOTE — Telephone Encounter (Signed)
Spoke with patient. Advised of results as seen below from [place]. Patient is agreeable and verbalizes understanding. TOC appointment scheduled for 10/28 at 10:30am. Patient is agreeable to date and time.  Routing to provider for final review. Patient agreeable to disposition. Will close encounter

## 2014-06-26 ENCOUNTER — Ambulatory Visit: Payer: BC Managed Care – PPO | Admitting: Pulmonary Disease

## 2014-07-04 ENCOUNTER — Ambulatory Visit: Payer: BC Managed Care – PPO

## 2014-07-04 ENCOUNTER — Telehealth: Payer: Self-pay | Admitting: Certified Nurse Midwife

## 2014-07-04 NOTE — Telephone Encounter (Signed)
Patient canceled urine toc appointment today. (car trouble). Patient rescheduled to 07/05/14 @ 10:30.

## 2014-07-05 ENCOUNTER — Ambulatory Visit (INDEPENDENT_AMBULATORY_CARE_PROVIDER_SITE_OTHER): Payer: BC Managed Care – PPO | Admitting: *Deleted

## 2014-07-05 VITALS — BP 138/98 | HR 92 | Resp 16 | Ht 61.0 in | Wt 139.0 lb

## 2014-07-05 DIAGNOSIS — R319 Hematuria, unspecified: Secondary | ICD-10-CM

## 2014-07-05 DIAGNOSIS — N390 Urinary tract infection, site not specified: Secondary | ICD-10-CM

## 2014-07-05 NOTE — Progress Notes (Signed)
Patient in today for TOC. She states she is done with Abx and denies any urinary sx. BP is high 138/98 right arm and 140/100 Left arm.  Advised to follow up with PCP. She may not have any high BP sx but it is serious and it needs attention since last time was high too. - pt agreed. Advised we will call if Mary Pratt has any further recommendations for pt.

## 2014-07-07 LAB — URINE CULTURE
Colony Count: NO GROWTH
Organism ID, Bacteria: NO GROWTH

## 2014-07-09 ENCOUNTER — Encounter: Payer: Self-pay | Admitting: Certified Nurse Midwife

## 2014-07-11 ENCOUNTER — Telehealth: Payer: Self-pay | Admitting: Certified Nurse Midwife

## 2014-07-11 NOTE — Telephone Encounter (Signed)
Patient calling to see if recent lab results are back. She was seen 07/05/14.

## 2014-07-11 NOTE — Telephone Encounter (Signed)
Regina Eck CNM patient seen on 10/29 for urine TOC. Please review and advise results.

## 2014-07-12 NOTE — Telephone Encounter (Signed)
Component     Latest Ref Rng 07/05/2014  Culture        Colony Count      NO GROWTH  Organism ID, Bacteria      NO GROWTH   Debbie, result as above. Will work on [employer] to be visible.

## 2014-07-12 NOTE — Telephone Encounter (Signed)
I do not see a result

## 2014-07-12 NOTE — Telephone Encounter (Signed)
I do not see a result in

## 2014-07-12 NOTE — Telephone Encounter (Signed)
Left detailed message to advise of normal results okay per release of information.  Advised to call back for any further concerns.  Will close encounter.

## 2014-07-12 NOTE — Telephone Encounter (Signed)
Notify patient results show no growth no further treatment needed

## 2014-07-13 ENCOUNTER — Telehealth: Payer: Self-pay | Admitting: Certified Nurse Midwife

## 2014-07-13 ENCOUNTER — Ambulatory Visit: Payer: BC Managed Care – PPO | Admitting: Certified Nurse Midwife

## 2014-07-13 NOTE — Telephone Encounter (Signed)
Spoke with patient. Advised patient of message as seen below from Regina Eck CNM. Patient is agreeable and verbalizes understanding. Patient states "I think my bacterial infection is coming back. Could she call in another prescription for me?" Patient was last seen for BV in 9/15. Advised patient will need to return for evaluation to insure proper treatment. Patient is agreeable and would like to come in to be seen today. Appointment scheduled for today at 12:45pm with Regina Eck CNM. Patient agreeable to date and time.  Regina Eck, CNM at 07/12/2014 3:40 PM     Status: Signed       Expand All Collapse All   Notify patient results show no growth no further treatment needed            Karen Chafe, RN at 07/12/2014 1:38 PM     Status: Signed       Expand All Collapse All   Component  Latest Ref Rng 07/05/2014  Culture     Colony Count   NO GROWTH  Organism ID, Bacteria   NO GROWTH   Debbie, result as above. Will work on [employer] to be visible.        Routing to provider for final review. Patient agreeable to disposition. Will close encounter

## 2014-07-13 NOTE — Telephone Encounter (Signed)
Pt checking on results

## 2014-07-13 NOTE — Telephone Encounter (Signed)
Left message regarding missed appointment that was made today. Does she wish to reschedule for the problem?

## 2014-08-07 ENCOUNTER — Encounter: Payer: Self-pay | Admitting: Nurse Practitioner

## 2014-08-07 ENCOUNTER — Ambulatory Visit (INDEPENDENT_AMBULATORY_CARE_PROVIDER_SITE_OTHER): Payer: BC Managed Care – PPO | Admitting: Nurse Practitioner

## 2014-08-07 VITALS — BP 130/100 | HR 100 | Temp 98.0°F | Resp 18 | Wt 137.0 lb

## 2014-08-07 DIAGNOSIS — R300 Dysuria: Secondary | ICD-10-CM

## 2014-08-07 DIAGNOSIS — Z113 Encounter for screening for infections with a predominantly sexual mode of transmission: Secondary | ICD-10-CM

## 2014-08-07 LAB — POCT URINALYSIS DIPSTICK
Bilirubin, UA: NEGATIVE
Glucose, UA: NEGATIVE
Ketones, UA: NEGATIVE
Nitrite, UA: POSITIVE
Urobilinogen, UA: NEGATIVE
pH, UA: 6

## 2014-08-07 MED ORDER — NITROFURANTOIN MONOHYD MACRO 100 MG PO CAPS: 100.0000 mg | ORAL_CAPSULE | Freq: Two times a day (BID) | ORAL | Status: DC

## 2014-08-07 MED ORDER — PHENAZOPYRIDINE HCL 200 MG PO TABS: 200.0000 mg | ORAL_TABLET | Freq: Three times a day (TID) | ORAL | Status: DC | PRN

## 2014-08-07 NOTE — Progress Notes (Signed)
S:  43 y.o.Divorced AA female presents with complaint of UTI. Symptoms began on  Sunday.  With symptoms of back pain, burning with urination, vaginal itching. Pertinent negatives include The patient is having no constitutional symptoms, denying fever, chills, anorexia, or weight loss.. Sexually active yes  Symptoms related to post coital Yes Current method of birth control Sterilization by Laparoscopy. Menopausal no Vaginal dryness no Same partner without change for 4 months. Last UTI documented 2 months.  ROS: no weight loss, fever, night sweats and feels well  O alert, oriented to person, place, and time   healthy,  alert and  not in acute distress  Mild lower mid abdomen  No CVA tenderness  cervix normal in appearance, external genitalia normal, no adnexal masses or tenderness, no cervical motion tenderness, uterus normal size, shape, and consistency and vagina normal without discharge   Diagnostic Test:    Urinalysis: + WBC, RBC, Nitrate   urine culture and GC & Chl  Assessment:  UTI   R/O STD   Plan:     Maintain adequate hydration. Follow up if symptoms not improving, and as needed.   Medication Therapy: Macrobid 100 mg BID # 14   Pyridium 200 mg tid prn   Follow with Urine C&S and GC & chl   Lab:TOC if Urine Culture is positive    RV

## 2014-08-07 NOTE — Patient Instructions (Signed)

## 2014-08-08 ENCOUNTER — Telehealth: Payer: Self-pay | Admitting: Nurse Practitioner

## 2014-08-08 LAB — URINALYSIS, MICROSCOPIC ONLY
Casts: NONE SEEN
Crystals: NONE SEEN
Squamous Epithelial / LPF: NONE SEEN
WBC, UA: >50 WBC/hpf — AB (ref <3)

## 2014-08-08 NOTE — Telephone Encounter (Signed)
I agree with the plan that we discussed to have the patient seen in the emergency department or urgent care if she was unable to arrive at the office prior to closing at 5 pm.  Thank you for your assistance.  Please contact the patient tomorrow to know how she is doing.

## 2014-08-08 NOTE — Telephone Encounter (Signed)
Patient calling requesting to speak with the nurse re: "I am unable to pee. Do I need to go to the hospital?"

## 2014-08-08 NOTE — Telephone Encounter (Signed)
Spoke with patient. Advised patient that I spoke with Dr.Silva who recommends that patient come in to be seen in office for catheterization. Patient is agreeable. "It will take me 45 minutes to get there." Advised patient with the pain she is in and symptoms she is having best to be seen locally in ER or is able to be seen at [facility]. Patient is agreeable and states that she will head to her local emergency room at this time.  Dr.Silva, agree with plan?

## 2014-08-08 NOTE — Telephone Encounter (Signed)
Spoke with patient at time of incoming call. Patient states that she was seen yesterday with Milford Cage, FNP for a UTI. Patient has not been able to urinate since this morning. Patient has starting taking pyridium 200 mg and macrobid 100 mg. Patient has now developed pain in her lower pelvic area, abdomen, and lower back. Denies fevers, chills, and nausea. Advised patient will speak with Dr.Silva and return call with further instructions and recommendations. Patient is agreeable.

## 2014-08-09 ENCOUNTER — Ambulatory Visit: Payer: BC Managed Care – PPO | Admitting: Certified Nurse Midwife

## 2014-08-09 LAB — IPS N GONORRHOEA AND CHLAMYDIA BY PCR

## 2014-08-09 NOTE — Telephone Encounter (Signed)
Left message to call Abbye Lao at [phone]. 

## 2014-08-10 LAB — URINE CULTURE: Colony Count: >=100000

## 2014-08-10 NOTE — Telephone Encounter (Signed)
Spoke with patient. Patient states that she did not end up going to the Emergency room. "I was able to use the bathroom and I have been fine since." Patient denies any urinary symptoms, difficulty voiding, back pain, and fevers. Patient will continue to take Pyridium 200 mg and Macrobid 100 mg as directed and await final culture results. Will call if she develops any further symptoms.  Routing to provider for final review. Patient agreeable to disposition. Will close encounter

## 2014-08-12 NOTE — Progress Notes (Signed)
Encounter reviewed by Dr. Adalyn Pennock Silva.  

## 2014-08-16 ENCOUNTER — Encounter: Payer: Self-pay | Admitting: Nurse Practitioner

## 2014-08-16 ENCOUNTER — Ambulatory Visit (INDEPENDENT_AMBULATORY_CARE_PROVIDER_SITE_OTHER): Payer: BC Managed Care – PPO | Admitting: Nurse Practitioner

## 2014-08-16 VITALS — BP 146/98 | HR 96 | Ht 60.75 in | Wt 139.0 lb

## 2014-08-16 DIAGNOSIS — Z113 Encounter for screening for infections with a predominantly sexual mode of transmission: Secondary | ICD-10-CM

## 2014-08-16 DIAGNOSIS — Z0000 Encounter for general adult medical examination without abnormal findings: Secondary | ICD-10-CM

## 2014-08-16 DIAGNOSIS — Z01419 Encounter for gynecological examination (general) (routine) without abnormal findings: Secondary | ICD-10-CM

## 2014-08-16 LAB — POCT URINALYSIS DIPSTICK
Bilirubin, UA: NEGATIVE
Blood, UA: NEGATIVE
Glucose, UA: NEGATIVE
Ketones, UA: NEGATIVE
Leukocytes, UA: NEGATIVE
Nitrite, UA: NEGATIVE
Protein, UA: NEGATIVE
Urobilinogen, UA: NEGATIVE
pH, UA: 7

## 2014-08-16 NOTE — Patient Instructions (Signed)

## 2014-08-16 NOTE — Progress Notes (Signed)
Patient ID: Mary Pratt, female   DOB: [birth year], 43 y.o.   MRN: [id number] 43 y.o. [id number] Divorced African American Fe here for annual exam. Recent treatment for UTI and completed med's on Tuesday.  Will do a urine micro today.  New partner for 4-5 months. Will get rest of STD's today.  Had GC & Chl done last week and was negative.  Patient's last menstrual period was 07/27/2014.          Sexually active: yes  The current method of family planning is tubal ligation.  Exercising: yes walking,treadmill & bike Smoker: no  Health Maintenance: Pap: 08/02/13, LSIL with + HR HPV; 08/28/13, colpo, LGSIL and koilocytotic atypia related to HPV, benign ECC; 05-26-12 ASCUS +HPV; colpo, 06/17/12 with benign ECC MMG: 08/03/14, Bi-Rads 1:  Negative  TDaP: 2010 Labs:  By PCP  Urine:  neg   reports that she has never smoked. She has never used smokeless tobacco. She reports that she does not drink alcohol or use illicit drugs.  Past Medical History  Diagnosis Date  . GERD with stricture     dilated 2010  . Helicobacter pylori gastritis 2010    treated w/ Pylera  . Asthma   . Functional dyspepsia     early satiety   . 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 Sickle cell trait     Past Surgical History  Procedure Laterality Date  . Wisdom tooth extraction    . Esophagogastroduodenoscopy  11/30/2008    esophageal ring dilated 28 French, H. pylori gastritis  . Tubal ligation  1991  . Colposcopy  2013    neg    Current Outpatient Prescriptions  Medication Sig Dispense Refill  . albuterol (VENTOLIN HFA) 108 (90 BASE) MCG/ACT inhaler Inhale 2 puffs into the lungs every 6 (six) hours as needed. 3 Inhaler 4  . busPIRone (BUSPAR) 10 MG tablet Take 1 tablet (10 mg total) by mouth 2 (two) times daily. 60 tablet 6  . DULoxetine (CYMBALTA) 60 MG capsule Take 60 mg by mouth daily.    Marland Kitchen esomeprazole (NEXIUM) 40 MG capsule Take 1 capsule (40 mg total) by mouth 2 (two) times daily. 180  capsule 1  . Fluticasone-Salmeterol (ADVAIR DISKUS) 250-50 MCG/DOSE AEPB Inhale 1 puff into the lungs 2 (two) times daily. 180 each 4  . NON FORMULARY ALLERGY INJECTIONS.  2 shots twice weekly    . traZODone (DESYREL) 150 MG tablet Take 150 mg by mouth at bedtime.     No current facility-administered medications for this visit.    Family History  Problem Relation Age of Onset  . Colon cancer Neg Hx   . Diabetes Mother   . Hypertension Mother   . Hypothyroidism Mother   . Breast cancer Maternal Aunt   . Brain cancer Maternal Uncle   . Lung cancer Maternal Grandmother     ROS:  Pertinent items are noted in HPI.  Otherwise, a comprehensive ROS was negative.  Exam:   BP 146/98 mmHg  Pulse 96  Ht 5' 0.75" (1.543 m)  Wt 139 lb (63.05 kg)  BMI 26.48 kg/m2  LMP 07/27/2014 Height: 5' 0.75" (154.3 cm)  Ht Readings from Last 3 Encounters:  08/16/14 5' 0.75" (1.543 m)  07/05/14 5\' 1"  (1.549 m)  06/15/14 5\' 1"  (1.549 m)    General appearance: alert, cooperative and appears stated age Head: Normocephalic, without obvious abnormality, atraumatic Neck: no adenopathy, supple, symmetrical, trachea midline and thyroid normal  to inspection and palpation Lungs: clear to auscultation bilaterally Breasts: normal appearance, no masses or tenderness Heart: regular rate and rhythm Abdomen: soft, non-tender; no masses,  no organomegaly Extremities: extremities normal, atraumatic, no cyanosis or edema Skin: Skin color, texture, turgor normal. No rashes or lesions Lymph nodes: Cervical, supraclavicular, and axillary nodes normal. No abnormal inguinal nodes palpated Neurologic: Grossly normal   Pelvic: External genitalia:  no lesions              Urethra:  normal appearing urethra with no masses, tenderness or lesions              Bartholin's and Skene's: normal                 Vagina: normal appearing vagina with normal color and discharge, no lesions              Cervix: anteverted               Pap taken: Yes.   Bimanual Exam:  Uterus:  normal size, contour, position, consistency, mobility, non-tender              Adnexa: no mass, fullness, tenderness               Rectovaginal: Confirms               Anus:  normal sphincter tone, no lesions  A:  Well Woman with normal exam  S/P BTL 1991  R/O STD's  History of abnormal pap LSIL with colpo 2014 & 2013  History of sickly cell trait  P:   Reviewed health and wellness pertinent to exam  Pap smear taken today  Mammogram is due 07/2015  Follow with pap  Will check urine micro today and follow  Counseled on breast self exam, mammography screening, adequate intake of calcium and vitamin D, diet and exercise, Kegel's exercises return annually or prn  An After Visit Summary was printed and given to the patient.

## 2014-08-17 LAB — STD PANEL
HIV 1&2 Ab, 4th Generation: NONREACTIVE
Hepatitis B Surface Ag: NEGATIVE

## 2014-08-17 LAB — URINALYSIS, MICROSCOPIC ONLY
Bacteria, UA: NONE SEEN
Casts: NONE SEEN
Crystals: NONE SEEN

## 2014-08-19 NOTE — Progress Notes (Signed)
Encounter reviewed by Dr. Kyreese Chio Silva.  

## 2014-08-20 LAB — IPS PAP TEST WITH HPV

## 2014-08-27 ENCOUNTER — Telehealth: Payer: Self-pay | Admitting: Emergency Medicine

## 2014-08-27 DIAGNOSIS — R87610 Atypical squamous cells of undetermined significance on cytologic smear of cervix (ASC-US): Secondary | ICD-10-CM

## 2014-08-27 DIAGNOSIS — R87810 Cervical high risk human papillomavirus (HPV) DNA test positive: Principal | ICD-10-CM

## 2014-08-27 NOTE — Telephone Encounter (Signed)
Patient notified of message from Milford Cage, [place].  She is agreeable to scheduling colposcopy. Brief description of procedure given to patient.  Colposcopy pre-procedure instructions given. Advised 800 mg of Motrin with food one hour prior to appointment. Motrin=Advil=Ibuprofen Can take 800 mg (Can purchase over the counter, you will need four 200 mg pills). Make sure to eat a meal before appointment and drink plenty of fluids. Patient verbalized understanding and will call to reschedule if will be on menses, patient with hx of BTL LMP 08/24/14. Advised will need to cancel within 24 hours or will have $100.00 no show fee placed to account.  Routing to provider for final review. Patient agreeable to disposition. Will close encounter  Cc Milford Cage, FNP  Dr. Quincy Simmonds.

## 2014-08-27 NOTE — Telephone Encounter (Signed)
-----   Message from Milford Cage, [place] sent at 08/21/2014  7:53 AM EST ----- Let patient know that pap is again abnormal and needs a repeat colpo.

## 2014-09-12 ENCOUNTER — Telehealth: Payer: Self-pay | Admitting: Obstetrics and Gynecology

## 2014-09-12 ENCOUNTER — Ambulatory Visit: Payer: BLUE CROSS/BLUE SHIELD | Admitting: Obstetrics and Gynecology

## 2014-09-12 NOTE — Telephone Encounter (Signed)
Pt calling to speak with someone in billing.

## 2014-09-12 NOTE — Telephone Encounter (Signed)
I called the patient to discuss her responsibility for the procedure as well as her balance. She is agreeable and would like to reschedule her appointment.

## 2014-09-12 NOTE — Telephone Encounter (Signed)
Please contact patient to discuss rescheduling colposcopy. Patient cancelled her appointment for today's colposcopy.   Cc- Ivar Drape.

## 2014-09-12 NOTE — Telephone Encounter (Signed)
Pt cancelled procedure appointment because she was not prepared to pay today. Would like nurse to call her and reschedule please.

## 2014-09-13 NOTE — Telephone Encounter (Signed)
Called patient to reschedule colposcopy appointment.   Procedure scheduled for 10/12/14 at 1000.   Patient is agreeable.   Colposcopy pre-procedure instructions given. Motrin instructions given. Motrin=Advil=Ibuprofen Can take 800 mg (Can purchase over the counter, you will need four 200 mg pills) every 8 hours as needed.  Take with food. Make sure to eat a meal before appointment and drink plenty of fluids. Patient verbalized understanding and will call to reschedule if will be on menses or has any concerns regarding pregnancy. Advised will need to cancel within 72 hours or will have $150.00 no show fee placed to account.  Patient agreeable.  She will not be on menses, expects next cycle 09/21/14, hx BTL. Routing to provider for final review. Patient agreeable to disposition. Will close encounter

## 2014-09-26 ENCOUNTER — Telehealth: Payer: Self-pay | Admitting: Obstetrics and Gynecology

## 2014-09-26 NOTE — Telephone Encounter (Signed)
Patient calling to reschedule her colposcopy from 10/12/14. She wants to move it up "with another provider."

## 2014-09-26 NOTE — Telephone Encounter (Signed)
Spoke with patient. Previous Dr.Lathrop patient. Seen for aex on 08/16/14 with Mary Cage, FNP.Patient requesting to move appointment to 1/26 or 1/29. Patient's cycle just ended today not sure exact day it started.Marland Kitchen Hx of BTL. Appointment for colposcopy rescheduled for 1/29 at 10am with Dr.Miller. Patient is agreeable to date and time.Instructions given. Take Motrin 800 mg po one hour before appointment with food. Make sure to eat a meal before appointment and drink plenty of fluids. Patient verbalized understanding and will call to reschedule if will be on menses or has any concerns regarding pregnancy. Advised will need to cancel within 72 hours or will have $150.00 late cancellation fee placed to account. Patient agreeable and verbalized understanding of all instructions.   Cc: Dr.Silva  Routing to provider for final review. Patient agreeable to disposition. Will close encounter

## 2014-10-05 ENCOUNTER — Ambulatory Visit (INDEPENDENT_AMBULATORY_CARE_PROVIDER_SITE_OTHER): Payer: BLUE CROSS/BLUE SHIELD | Admitting: Obstetrics & Gynecology

## 2014-10-05 VITALS — BP 132/84 | HR 80 | Resp 16 | Wt 137.0 lb

## 2014-10-05 DIAGNOSIS — R87610 Atypical squamous cells of undetermined significance on cytologic smear of cervix (ASC-US): Secondary | ICD-10-CM

## 2014-10-05 DIAGNOSIS — N760 Acute vaginitis: Secondary | ICD-10-CM

## 2014-10-05 DIAGNOSIS — A499 Bacterial infection, unspecified: Secondary | ICD-10-CM

## 2014-10-05 DIAGNOSIS — B9689 Other specified bacterial agents as the cause of diseases classified elsewhere: Secondary | ICD-10-CM

## 2014-10-05 DIAGNOSIS — R87810 Cervical high risk human papillomavirus (HPV) DNA test positive: Secondary | ICD-10-CM

## 2014-10-05 MED ORDER — METRONIDAZOLE 0.75 % VA GEL: 1.0000 | Freq: Every day | VAGINAL | Status: DC

## 2014-10-05 NOTE — Progress Notes (Addendum)
Subjective:     Patient ID: Mary Pratt, female   DOB: [birth year], 44 y.o.   MRN: [id number]  HPI 44 yo G3P3 DAAF here for colposcopy due to ASCUS with + HR HPV pap obtained 08/16/14.  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  Did have STD testing including GC/Chl December.  Reviewed in EPIC.  Denies new sexual partners.  No bladder complaints.  No pelvic pain.  No fevers.  Patient has been counseled about results and procedure.  Risks and benefits have bene reviewed including immediate and/or delayed bleeding, infection, cervical scaring from procedure, possibility of needing additional follow up as well as treatment.  rare risks of missing a lesion discussed as well.  All questions answered.  Pt ready to proceed.  Review of Systems  All other systems reviewed and are negative.      Objective:   Physical Exam  Genitourinary:      BP 132/84 mmHg  Pulse 80  Resp 16  Wt 137 lb (62.143 kg)  LMP 09/21/2014  General appearance: alert, cooperative and appears stated age Head: Normocephalic, without obvious abnormality, atraumatic Neurologic: Grossly normal  Pelvic: External genitalia:  no lesions              Urethra:  normal appearing urethra with no masses, tenderness or lesions              Bartholins and Skenes: normal                 Vagina: normal appearing vagina with normal color and discharge, no lesions              Cervix: no lesions              Pap taken: No  Wet smear obtained:  Ph 5.0.  Saline: + Clue cells, neg trich.  KOH:  Neg yeast, + Whiff  Speculum placed.  3% acetic acid applied to cervix for >45 seconds.  Cervix visualized with both 7.5X and 15X magnification.  Green filter also used.  Lugols solution was used.  Findings:  AWE  with HPV changes at 8 o'clock .  Biopsy:  8 o'clock.  ECC:  was performed.  Monsel's was needed.  Excellent hemostasis was present.  Pt tolerated procedure well and all instruments were removed.  Findings noted above on picture of cervix.    Assessment:     ASCUS pap with + HR HPV Vaginal discharge consistent with BV.  STD testing 12/15 including GC and Chl    Plan:     [facility]opsy at [facility] and [place] pending.  If CIN1 or less, repeat pap and HR HPV. Metrogel 2.26% one applicator full qhs x 5 days.

## 2014-10-09 LAB — IPS OTHER TISSUE BIOPSY

## 2014-10-11 ENCOUNTER — Telehealth: Payer: Self-pay | Admitting: Emergency Medicine

## 2014-10-11 NOTE — Telephone Encounter (Signed)
Message left to return call to [place] at [phone], advised message is not urgent.

## 2014-10-11 NOTE — Telephone Encounter (Signed)
Patient notified.  Agreeable and is scheduled for annual exam.

## 2014-10-11 NOTE — Telephone Encounter (Signed)
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  ECC negative.  Needs repeat Pap and HR HPV 1 year.  08 recall.

## 2014-10-12 ENCOUNTER — Ambulatory Visit: Payer: BLUE CROSS/BLUE SHIELD | Admitting: Obstetrics and Gynecology

## 2014-10-15 ENCOUNTER — Telehealth: Payer: Self-pay | Admitting: Pulmonary Disease

## 2014-10-15 MED ORDER — ESOMEPRAZOLE MAGNESIUM 40 MG PO CPDR: 40.0000 mg | DELAYED_RELEASE_CAPSULE | Freq: Two times a day (BID) | ORAL | Status: DC

## 2014-10-15 NOTE — Telephone Encounter (Signed)
Nexium refilled to [facility].  Pt needs OV with [facility] to follow up.  LMTCB x 1

## 2014-10-16 NOTE — Telephone Encounter (Signed)
Called and spoke to pt. Appt made with [place] on 10/24/2014. Pt verbalized understanding and denied any further questions or concerns at this time.

## 2014-10-24 ENCOUNTER — Ambulatory Visit: Payer: Self-pay | Admitting: Pulmonary Disease

## 2015-01-08 ENCOUNTER — Encounter: Payer: Self-pay | Admitting: Obstetrics & Gynecology

## 2015-01-17 ENCOUNTER — Telehealth: Payer: Self-pay | Admitting: Pulmonary Disease

## 2015-01-18 MED ORDER — PREDNISONE 10 MG PO TABS: ORAL_TABLET | ORAL | Status: DC

## 2015-01-18 NOTE — Telephone Encounter (Signed)
If she is not producing mucus, doubt she has a bronchitis. Would send in another 8 day taper of prednisone.  If she is not seeing any improvement by Monday, needs to come in for OV and cxr.

## 2015-01-18 NOTE — Telephone Encounter (Signed)
lmtcb X1 for pt. Called in pred taper to [facility].

## 2015-01-18 NOTE — Telephone Encounter (Signed)
Spoke with pt. Reports increased SOB, dry cough, wheezing. Onset was 1-2 weeks ago. Denies chest tightness, fever at this time. Was given a prednisone taper from one of her Allergist which help minimally. Would like KC's recommendations.  [place] - please advise. Thanks.

## 2015-01-21 ENCOUNTER — Ambulatory Visit (INDEPENDENT_AMBULATORY_CARE_PROVIDER_SITE_OTHER): Payer: BLUE CROSS/BLUE SHIELD | Admitting: Pulmonary Disease

## 2015-01-21 ENCOUNTER — Encounter: Payer: Self-pay | Admitting: Pulmonary Disease

## 2015-01-21 VITALS — BP 132/92 | HR 108 | Temp 98.0°F | Ht 61.0 in | Wt 141.0 lb

## 2015-01-21 DIAGNOSIS — J4540 Moderate persistent asthma, uncomplicated: Secondary | ICD-10-CM

## 2015-01-21 MED ORDER — PREDNISONE 10 MG PO TABS: ORAL_TABLET | ORAL | Status: DC

## 2015-01-21 NOTE — Telephone Encounter (Signed)
Spoke with pt. States that we didn't call her until 5:30pm on Friday. She was very rude and said we should have called her before then. Advised her that we called her as soon as [place] addressed the message. Has ROV today. Nothing further was needed.

## 2015-01-21 NOTE — Patient Instructions (Signed)
Stay on your advair twice a day everyday Will treat with a prednisone taper to see if this gets your breathing back to baseline. Will check your formulary, and see what reflux medications they will cover. If you improve, and are doing well, would followup in 43mos with Dr. [place] Bells.  If you are not getting better, please call.

## 2015-01-21 NOTE — Addendum Note (Signed)
Addended by: Mathis Dad on: 01/21/2015 02:56 PM   Modules accepted: Orders

## 2015-01-21 NOTE — Progress Notes (Signed)
   Subjective:    Patient ID: Mary Pratt, female    DOB: [birth year], 44 y.o.   MRN: [id number]  HPI The patient comes in today for an acute sick visit. She has known asthma, as well as ongoing esophageal and reflux issues. She had been doing very well on her current regimen, but has had increasing chest tightness and shortness of breath over the last one month. She has some increased cough but no congestion or mucus. She does not feel that she has an acute sinus infection. She is having epigastric fullness and discomfort, and unfortunately is taking only over-the-counter low-dose Nexium. Her insurance would not cover. Nexium, and she had side effects taking omeprazole.   Review of Systems  Constitutional: Negative for fever and unexpected weight change.  HENT: Negative for congestion, dental problem, ear pain, nosebleeds, postnasal drip, rhinorrhea, sinus pressure, sneezing, sore throat and trouble swallowing.   Eyes: Negative for redness and itching.  Respiratory: Positive for cough, shortness of breath and wheezing. Negative for chest tightness.   Cardiovascular: Negative for palpitations and leg swelling.  Gastrointestinal: Negative for nausea and vomiting.  Genitourinary: Negative for dysuria.  Musculoskeletal: Negative for joint swelling.  Skin: Negative for rash.  Neurological: Negative for headaches.  Hematological: Does not bruise/bleed easily.  Psychiatric/Behavioral: Negative for dysphoric mood. The patient is not nervous/anxious.        Objective:   Physical Exam Well-developed female in no acute distress Nose without purulence or discharge noted Neck without lymphadenopathy or thyromegaly Chest with clear breath sounds, no wheezing Cardiac exam with regular rate and rhythm Lower extremities without edema, no cyanosis Alert and oriented, moves all 4 extremities.       Assessment & Plan:

## 2015-01-21 NOTE — Assessment & Plan Note (Signed)
The patient is staying compliant on her asthma medications, however has been having increased symptoms over the last month or so. Unfortunately, she is having more GI symptoms, and I suspect this may be aggravating or even mimicking her asthma symptoms. She is currently taking over-the-counter Nexium, and does not feel this is doing the job. She could not take omeprazole because of side effects. At this point, I would like to treat her with a short course of prednisone, but also get her on a proton pump inhibitor at a higher strength that she can tolerate and also get covered by insurance. If she continues to have GI issues, I have encouraged her to get back with Dr. Carlean Purl.

## 2015-02-14 ENCOUNTER — Telehealth: Payer: Self-pay | Admitting: Internal Medicine

## 2015-02-15 NOTE — Telephone Encounter (Signed)
Patient with reflux and recently was evaluated by Dr. Gwenette Greet, he changed her PPI.  He changed her PPI 2 weeks ago with no relief.  She will come in on Monday and see Dr. Carlean Purl at 8:30

## 2015-02-18 ENCOUNTER — Ambulatory Visit (INDEPENDENT_AMBULATORY_CARE_PROVIDER_SITE_OTHER)
Admission: RE | Admit: 2015-02-18 | Discharge: 2015-02-18 | Disposition: A | Discharge status: Home / Self Care | Payer: BLUE CROSS/BLUE SHIELD | Source: Ambulatory Visit | Attending: Internal Medicine | Admitting: Internal Medicine

## 2015-02-18 ENCOUNTER — Other Ambulatory Visit (INDEPENDENT_AMBULATORY_CARE_PROVIDER_SITE_OTHER): Payer: BLUE CROSS/BLUE SHIELD

## 2015-02-18 ENCOUNTER — Ambulatory Visit (INDEPENDENT_AMBULATORY_CARE_PROVIDER_SITE_OTHER): Payer: BLUE CROSS/BLUE SHIELD | Admitting: Internal Medicine

## 2015-02-18 ENCOUNTER — Encounter: Payer: Self-pay | Admitting: Internal Medicine

## 2015-02-18 ENCOUNTER — Telehealth: Payer: Self-pay

## 2015-02-18 VITALS — BP 130/86 | HR 88 | Ht 60.25 in | Wt 146.4 lb

## 2015-02-18 DIAGNOSIS — K219 Gastro-esophageal reflux disease without esophagitis: Secondary | ICD-10-CM | POA: Diagnosis not present

## 2015-02-18 DIAGNOSIS — R0609 Other forms of dyspnea: Secondary | ICD-10-CM

## 2015-02-18 DIAGNOSIS — J45901 Unspecified asthma with (acute) exacerbation: Secondary | ICD-10-CM | POA: Diagnosis not present

## 2015-02-18 DIAGNOSIS — R0600 Dyspnea, unspecified: Secondary | ICD-10-CM

## 2015-02-18 DIAGNOSIS — R7989 Other specified abnormal findings of blood chemistry: Secondary | ICD-10-CM

## 2015-02-18 LAB — CBC WITH DIFFERENTIAL/PLATELET
Basophils Absolute: 0 10*3/uL (ref 0.0–0.1)
Basophils Relative: 0.5 % (ref 0.0–3.0)
Eosinophils Absolute: 0.2 10*3/uL (ref 0.0–0.7)
Eosinophils Relative: 3.1 % (ref 0.0–5.0)
HCT: 37.3 % (ref 36.0–46.0)
Hemoglobin: 12.1 g/dL (ref 12.0–15.0)
Lymphocytes Relative: 20.1 % (ref 12.0–46.0)
Lymphs Abs: 1.4 10*3/uL (ref 0.7–4.0)
MCHC: 32.5 g/dL (ref 30.0–36.0)
MCV: 87.2 fl (ref 78.0–100.0)
Monocytes Absolute: 0.5 10*3/uL (ref 0.1–1.0)
Monocytes Relative: 7.1 % (ref 3.0–12.0)
Neutro Abs: 5 10*3/uL (ref 1.4–7.7)
Neutrophils Relative %: 69.2 % (ref 43.0–77.0)
Platelets: 224 10*3/uL (ref 150.0–400.0)
RBC: 4.27 Mil/uL (ref 3.87–5.11)
RDW: 15.4 % (ref 11.5–15.5)
WBC: 7.2 10*3/uL (ref 4.0–10.5)

## 2015-02-18 LAB — BASIC METABOLIC PANEL
BUN: 12 mg/dL (ref 6–23)
CO2: 30 mEq/L (ref 19–32)
Calcium: 8.7 mg/dL (ref 8.4–10.5)
Chloride: 102 mEq/L (ref 96–112)
Creatinine, Ser: 0.95 mg/dL (ref 0.40–1.20)
GFR: 81.63 mL/min (ref >60.00)
Glucose, Bld: 83 mg/dL (ref 70–99)
Potassium: 3.3 mEq/L — ABNORMAL LOW (ref 3.5–5.1)
Sodium: 137 mEq/L (ref 135–145)

## 2015-02-18 LAB — BRAIN NATRIURETIC PEPTIDE: Pro B Natriuretic peptide (BNP): 1341 pg/mL — ABNORMAL HIGH (ref 0.0–100.0)

## 2015-02-18 MED ORDER — POTASSIUM CHLORIDE CRYS ER 20 MEQ PO TBCR: 20.0000 meq | EXTENDED_RELEASE_TABLET | Freq: Every day | ORAL | Status: DC

## 2015-02-18 NOTE — Progress Notes (Signed)
Quick Note:  Findings suggest CHF as do labs (elevated BNP) echocardogram pending Needs cardiology referral - [facility] in [place] please Let me know who and when please ______

## 2015-02-18 NOTE — Patient Instructions (Signed)
   Today please go to the basement and have a chest x-ray done before leaving the building.  Your physician has requested that you go to the basement for the lab work before leaving today.  We have made you an appointment for an echo cardiogram for June 17 at 2:00pm at [facility] located at [address], [place].  If you need to change this appointment you can call them at [phone].    I appreciate the opportunity to care for you. Silvano Rusk, M.D., [facility]

## 2015-02-18 NOTE — Progress Notes (Signed)
Quick Note:  K is low Start KCL 20 mEQ daily please # 30 1 RF  She is being referred to cardiology - see CXR note ______

## 2015-02-18 NOTE — Telephone Encounter (Signed)
Patient is scheduled for 03/05/15 8:40 with Dr. Bronson Ing.  Patient notified of the results and the recommendations.

## 2015-02-18 NOTE — Progress Notes (Signed)
Subjective:    Patient ID: Mary Pratt, female    DOB: [birth year], 44 y.o.   MRN: [id number] Cc: short of breath HPI The patient is a pleasant middle-aged African-American woman I know from his tree of reflux,  esophageal dilation for dysphagia with stricture and H. pylori gastritis. She has done well on a PPI regarding dyspepsia and reflux. Lately in the past month or so she has had problems with dyspnea on exertion. She saw Dr. Quillian Quince, her primary care and was prescribed prednisone and said she improved but the symptoms returned. In May she had seen Dr. Gwenette Greet of pulmonary who thought maybe her reflux was flaring up driving her asthma symptoms more. She says that she can't walk very far before she gets short of breath, she feels like there something in her abdomen and swelling there, and she has a dry cough. She is not describing wheezing. She says her legs are swelling some. There is no chest pain. She is having some epigastric pain. She did have an abdominal ultrasound, I do not have those results but she said it was unrevealing, it was done at [facility]. GI review of systems is otherwise notable from bloating and gas and some anorexia. She reports that she had a major depression over the loss of her son 14 months ago. Allergies  Allergen Reactions  . Amoxicillin     REACTION: Tacycardia  . Clarithromycin    Outpatient Prescriptions Prior to Visit  Medication Sig Dispense Refill  . albuterol (VENTOLIN HFA) 108 (90 BASE) MCG/ACT inhaler Inhale 2 puffs into the lungs every 6 (six) hours as needed. 3 Inhaler 4  . clonazePAM (KLONOPIN) 0.5 MG tablet Take 0.25 mg by mouth 2 (two) times daily as needed for anxiety.    . Fluticasone-Salmeterol (ADVAIR DISKUS) 250-50 MCG/DOSE AEPB Inhale 1 puff into the lungs 2 (two) times daily. 180 each 4  . loratadine (CLARITIN) 10 MG tablet Take 10 mg by mouth daily.    . NON FORMULARY ALLERGY INJECTIONS.  2 shots twice weekly    . QNASL 80 MCG/ACT  AERS     . traZODone (DESYREL) 150 MG tablet Take 150 mg by mouth at bedtime.    . Esomeprazole Magnesium (NEXIUM 24HR PO) Take by mouth daily.    . predniSONE (DELTASONE) 10 MG tablet Take 4 tablets with food x 2 days, 3 tablets x 2 days, 2 tablets x 2 days, 1 tablet x 2 days and STOP 20 tablet 0   No facility-administered medications prior to visit.   Past Medical History  Diagnosis Date  . GERD with stricture     dilated 2010  . Helicobacter pylori gastritis 2010    treated w/ Pylera  . Asthma   . Functional dyspepsia     early satiety   . 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 Sickle cell trait    Past Surgical History  Procedure Laterality Date  . Wisdom tooth extraction    . Esophagogastroduodenoscopy  11/30/2008    esophageal ring dilated 43 French, H. pylori gastritis  . Tubal ligation  1991  . Colposcopy  2013    neg   History   Social History  . Marital Status: Divorced    Spouse Name: N/A  . Number of Children: 3  . Years of Education: N/A   Occupational History  . texturing operator    Social History Main Topics  . Smoking status: Never Smoker   .  Smokeless tobacco: Never Used  . Alcohol Use: No  . Drug Use: No  . Sexual Activity:    Partners: Male    Birth Control/ Protection: Surgical     Comment: BTL   Other Topics Concern  . None   Social History Narrative   Daily caffeine use - <1   2 sons 1 daughter 1 son deceased   Single      Family History  Problem Relation Age of Onset  . Colon cancer Neg Hx   . Diabetes Mother   . Hypertension Mother   . Hypothyroidism Mother   . Breast cancer Maternal Aunt   . Brain cancer Maternal Uncle   . Lung cancer Maternal Grandmother   . Colon polyps Neg Hx   . Esophageal cancer Neg Hx     Review of Systems This is positive for allergies, anxiety and depression and fatigue, night sweats, insomnia. All other review of systems as above or negative.    Objective:   Physical Exam @BP   130/86 mmHg  Pulse 88  Ht 5' 0.25" (1.53 m)  Wt 146 lb 6 oz (66.395 kg)  BMI 28.36 kg/m2  LMP 02/04/2015@  General:  Well-developed, well-nourished and in no acute distress Eyes:  anicteric. ENT:   Mouth and posterior pharynx free of lesions.  Neck:   supple w/o thyromegaly or mass.  Lungs: Clear to auscultation bilaterally. Heart:  S1S2, no rubs, murmurs, gallops. No jugular venous distention or hepatojugular reflux Abdomen:  soft, mildly tender in the right upper quadrant and epigastrium, no hepatosplenomegaly, hernia, or mass and BS+.  Lymph:  no cervical or supraclavicular adenopathy. Extremities:   no edema, cyanosis or clubbing Skin   no rash. Positive tattoos Neuro:  A&O x 3.  Psych:  appropriate mood and  Affect.   Data Reviewed: Pulmonary note May 2016 I have requested copies of her abdominal ultrasound      Assessment & Plan:   1. DOE (dyspnea on exertion)   2. Asthma with acute exacerbation, unspecified asthma severity   3. Gastroesophageal reflux disease without esophagitis    Because her symptoms is not entirely clear to me. Her exam does not reveal anything. The fact that she improved with prednisone suggest that her asthma was flaring causing this problem. She could have some sort of cardiac problem though seems unlikely at [place] investigation. Anemia could cause some of this as well. She has a chronic history of functional abdominal complaints as well could be playing a role. I'm not sure what role the depression over her sons death displaying. He did not discuss that today but it could be a trigger for some problems as well.  Plan is for evaluation with BNP, CBC, lites and renal function, PA and lateral chest x-ray, and an echocardiogram. I will review the ultrasound results.  NL:[id number], TERRY, MD  Labs and CXR results back:  Lab Results  Component Value Date   WBC 7.2 02/18/2015   HGB 12.1 02/18/2015   HCT 37.3 02/18/2015   MCV 87.2 02/18/2015   PLT  224.0 02/18/2015     Chemistry      Component Value Date/Time   NA 137 02/18/2015 0933   K 3.3* 02/18/2015 0933   CL 102 02/18/2015 0933   CO2 30 02/18/2015 0933   BUN 12 02/18/2015 0933   CREATININE 0.95 02/18/2015 0933      Component Value Date/Time   CALCIUM 8.7 02/18/2015 0933   ALKPHOS 47 05/15/2009 1030   AST  20 05/15/2009 1030   ALT 15 05/15/2009 1030   BILITOT 0.8 05/15/2009 1030       Pro B Natriuretic peptide (BNP) 0.0 - 100.0 pg/mL 1341.0 (H)       CLINICAL DATA: Shortness of breath. Cough.  EXAM: CHEST 2 VIEW  COMPARISON: None.  FINDINGS: Cardiomegaly with pulmonary vascular prominence and diffuse mild interstitial prominence with small pleural effusions noted. No pneumothorax. No acute osseous abnormality.  IMPRESSION: Findings consistent with congestive heart failure mild interstitial edema and small pleural effusions.   Electronically Signed  By: Marcello Moores Register  On: 02/18/2015 09:33   We have informed her of results and referred to cardiology.  Patient is scheduled for 03/05/15 8:40 with Dr. Bronson Ing. Patient notified of the results and the recommendations.

## 2015-02-18 NOTE — Telephone Encounter (Signed)
-----   Message from Gatha Mayer, MD sent at 02/18/2015 12:54 PM EDT ----- Findings suggest CHF as do labs (elevated BNP) echocardogram pending Needs cardiology referral - [facility] in [place] please Let me know who and when please

## 2015-02-21 ENCOUNTER — Encounter: Payer: Self-pay | Admitting: Obstetrics & Gynecology

## 2015-02-22 ENCOUNTER — Ambulatory Visit (INDEPENDENT_AMBULATORY_CARE_PROVIDER_SITE_OTHER): Payer: BLUE CROSS/BLUE SHIELD | Admitting: Nurse Practitioner

## 2015-02-22 ENCOUNTER — Other Ambulatory Visit: Payer: Self-pay

## 2015-02-22 ENCOUNTER — Encounter (HOSPITAL_COMMUNITY): Payer: Self-pay | Admitting: General Practice

## 2015-02-22 ENCOUNTER — Inpatient Hospital Stay (HOSPITAL_COMMUNITY)
Admission: AD | Admit: 2015-02-22 | Discharge: 2015-02-27 | DRG: 287 | Disposition: A | Discharge status: Home / Self Care | Payer: BLUE CROSS/BLUE SHIELD | Source: Ambulatory Visit | Attending: Cardiology | Admitting: Cardiology

## 2015-02-22 ENCOUNTER — Ambulatory Visit (HOSPITAL_BASED_OUTPATIENT_CLINIC_OR_DEPARTMENT_OTHER): Payer: BLUE CROSS/BLUE SHIELD

## 2015-02-22 ENCOUNTER — Encounter: Payer: Self-pay | Admitting: Nurse Practitioner

## 2015-02-22 ENCOUNTER — Other Ambulatory Visit: Payer: Self-pay | Admitting: Nurse Practitioner

## 2015-02-22 VITALS — BP 148/100 | HR 110 | Ht 61.0 in | Wt 150.4 lb

## 2015-02-22 DIAGNOSIS — D573 Sickle-cell trait: Secondary | ICD-10-CM | POA: Diagnosis present

## 2015-02-22 DIAGNOSIS — K219 Gastro-esophageal reflux disease without esophagitis: Secondary | ICD-10-CM | POA: Diagnosis present

## 2015-02-22 DIAGNOSIS — J45909 Unspecified asthma, uncomplicated: Secondary | ICD-10-CM | POA: Diagnosis present

## 2015-02-22 DIAGNOSIS — K222 Esophageal obstruction: Secondary | ICD-10-CM | POA: Diagnosis present

## 2015-02-22 DIAGNOSIS — Z79899 Other long term (current) drug therapy: Secondary | ICD-10-CM

## 2015-02-22 DIAGNOSIS — I10 Essential (primary) hypertension: Secondary | ICD-10-CM

## 2015-02-22 DIAGNOSIS — N179 Acute kidney failure, unspecified: Secondary | ICD-10-CM | POA: Diagnosis not present

## 2015-02-22 DIAGNOSIS — I5021 Acute systolic (congestive) heart failure: Principal | ICD-10-CM | POA: Diagnosis present

## 2015-02-22 DIAGNOSIS — N289 Disorder of kidney and ureter, unspecified: Secondary | ICD-10-CM | POA: Diagnosis present

## 2015-02-22 DIAGNOSIS — I272 Other secondary pulmonary hypertension: Secondary | ICD-10-CM | POA: Diagnosis not present

## 2015-02-22 DIAGNOSIS — F329 Major depressive disorder, single episode, unspecified: Secondary | ICD-10-CM | POA: Diagnosis present

## 2015-02-22 DIAGNOSIS — I428 Other cardiomyopathies: Secondary | ICD-10-CM

## 2015-02-22 DIAGNOSIS — I429 Cardiomyopathy, unspecified: Secondary | ICD-10-CM | POA: Diagnosis not present

## 2015-02-22 DIAGNOSIS — Z881 Allergy status to other antibiotic agents status: Secondary | ICD-10-CM

## 2015-02-22 DIAGNOSIS — R0600 Dyspnea, unspecified: Secondary | ICD-10-CM

## 2015-02-22 DIAGNOSIS — R609 Edema, unspecified: Secondary | ICD-10-CM | POA: Diagnosis present

## 2015-02-22 DIAGNOSIS — R0609 Other forms of dyspnea: Secondary | ICD-10-CM | POA: Diagnosis not present

## 2015-02-22 DIAGNOSIS — I420 Dilated cardiomyopathy: Secondary | ICD-10-CM | POA: Diagnosis not present

## 2015-02-22 HISTORY — DX: Other cardiomyopathies: I42.8

## 2015-02-22 HISTORY — DX: Chronic systolic (congestive) heart failure: I50.22

## 2015-02-22 HISTORY — DX: Adjustment disorder with depressed mood: F43.21

## 2015-02-22 HISTORY — DX: Anxiety disorder, unspecified: F41.9

## 2015-02-22 LAB — COMPREHENSIVE METABOLIC PANEL
ALT: 55 U/L — ABNORMAL HIGH (ref 14–54)
AST: 46 U/L — ABNORMAL HIGH (ref 15–41)
Albumin: 3.2 g/dL — ABNORMAL LOW (ref 3.5–5.0)
Alkaline Phosphatase: 75 U/L (ref 38–126)
Anion gap: 8 (ref 5–15)
BUN: 14 mg/dL (ref 6–20)
CO2: 24 mmol/L (ref 22–32)
Calcium: 8.7 mg/dL — ABNORMAL LOW (ref 8.9–10.3)
Chloride: 106 mmol/L (ref 101–111)
Creatinine, Ser: 1.02 mg/dL — ABNORMAL HIGH (ref 0.44–1.00)
GFR calc Af Amer: >60 mL/min (ref >60)
GFR calc non Af Amer: >60 mL/min (ref >60)
Glucose, Bld: 92 mg/dL (ref 65–99)
Potassium: 4.4 mmol/L (ref 3.5–5.1)
Sodium: 138 mmol/L (ref 135–145)
Total Bilirubin: 0.9 mg/dL (ref 0.3–1.2)
Total Protein: 6.4 g/dL — ABNORMAL LOW (ref 6.5–8.1)

## 2015-02-22 LAB — CBC WITH DIFFERENTIAL/PLATELET
Basophils Absolute: 0 10*3/uL (ref 0.0–0.1)
Basophils Relative: 0 % (ref 0–1)
Eosinophils Absolute: 0.2 10*3/uL (ref 0.0–0.7)
Eosinophils Relative: 2 % (ref 0–5)
HCT: 34.5 % — ABNORMAL LOW (ref 36.0–46.0)
Hemoglobin: 11.6 g/dL — ABNORMAL LOW (ref 12.0–15.0)
Lymphocytes Relative: 25 % (ref 12–46)
Lymphs Abs: 1.7 10*3/uL (ref 0.7–4.0)
MCH: 28.2 pg (ref 26.0–34.0)
MCHC: 33.6 g/dL (ref 30.0–36.0)
MCV: 83.9 fL (ref 78.0–100.0)
Monocytes Absolute: 0.4 10*3/uL (ref 0.1–1.0)
Monocytes Relative: 6 % (ref 3–12)
Neutro Abs: 4.6 10*3/uL (ref 1.7–7.7)
Neutrophils Relative %: 67 % (ref 43–77)
Platelets: 260 10*3/uL (ref 150–400)
RBC: 4.11 MIL/uL (ref 3.87–5.11)
RDW: 14.3 % (ref 11.5–15.5)
WBC: 6.9 10*3/uL (ref 4.0–10.5)

## 2015-02-22 LAB — PROTIME-INR
INR: 1.1 (ref 0.00–1.49)
Prothrombin Time: 14.4 seconds (ref 11.6–15.2)

## 2015-02-22 LAB — APTT: aPTT: 31 seconds (ref 24–37)

## 2015-02-22 LAB — BRAIN NATRIURETIC PEPTIDE: B Natriuretic Peptide: 2024.7 pg/mL — ABNORMAL HIGH (ref 0.0–100.0)

## 2015-02-22 LAB — TSH: TSH: 1.663 u[IU]/mL (ref 0.350–4.500)

## 2015-02-22 LAB — MAGNESIUM: Magnesium: 1.9 mg/dL (ref 1.7–2.4)

## 2015-02-22 LAB — PREGNANCY, URINE: Preg Test, Ur: NEGATIVE

## 2015-02-22 LAB — TROPONIN I: Troponin I: 0.04 ng/mL — ABNORMAL HIGH (ref <0.031)

## 2015-02-22 MED ORDER — SODIUM CHLORIDE 0.9 % IJ SOLN
3.0000 mL | Freq: Two times a day (BID) | INTRAMUSCULAR | Status: DC
  Administered 2015-02-22 – 2015-02-27 (×10): 3 mL via INTRAVENOUS

## 2015-02-22 MED ORDER — ASPIRIN EC 81 MG PO TBEC
81.0000 mg | DELAYED_RELEASE_TABLET | Freq: Every day | ORAL | Status: DC
  Administered 2015-02-22 – 2015-02-27 (×6): 81 mg via ORAL
  Filled 2015-02-22 (×6): qty 1

## 2015-02-22 MED ORDER — POTASSIUM CHLORIDE CRYS ER 20 MEQ PO TBCR
20.0000 meq | EXTENDED_RELEASE_TABLET | Freq: Two times a day (BID) | ORAL | Status: DC
  Administered 2015-02-22 – 2015-02-25 (×6): 20 meq via ORAL
  Filled 2015-02-22 (×7): qty 1

## 2015-02-22 MED ORDER — SODIUM CHLORIDE 0.9 % IJ SOLN: 3.0000 mL | INTRAMUSCULAR | Status: DC | PRN

## 2015-02-22 MED ORDER — CARVEDILOL 3.125 MG PO TABS
3.1250 mg | ORAL_TABLET | Freq: Two times a day (BID) | ORAL | Status: DC
  Administered 2015-02-22 – 2015-02-24 (×5): 3.125 mg via ORAL
  Filled 2015-02-22 (×6): qty 1

## 2015-02-22 MED ORDER — SODIUM CHLORIDE 0.9 % IV SOLN: 250.0000 mL | INTRAVENOUS | Status: DC | PRN

## 2015-02-22 MED ORDER — FUROSEMIDE 10 MG/ML IJ SOLN
60.0000 mg | Freq: Two times a day (BID) | INTRAMUSCULAR | Status: DC
  Administered 2015-02-22 – 2015-02-24 (×5): 60 mg via INTRAVENOUS
  Filled 2015-02-22 (×8): qty 6

## 2015-02-22 MED ORDER — LISINOPRIL 2.5 MG PO TABS
2.5000 mg | ORAL_TABLET | Freq: Every day | ORAL | Status: DC
  Administered 2015-02-22 – 2015-02-26 (×5): 2.5 mg via ORAL
  Filled 2015-02-22 (×6): qty 1

## 2015-02-22 MED ORDER — PANTOPRAZOLE SODIUM 40 MG PO TBEC
40.0000 mg | DELAYED_RELEASE_TABLET | Freq: Two times a day (BID) | ORAL | Status: DC
  Administered 2015-02-22 – 2015-02-27 (×10): 40 mg via ORAL
  Filled 2015-02-22 (×10): qty 1

## 2015-02-22 MED ORDER — ACETAMINOPHEN 325 MG PO TABS
650.0000 mg | ORAL_TABLET | ORAL | Status: DC | PRN
  Administered 2015-02-23 – 2015-02-25 (×4): 650 mg via ORAL
  Filled 2015-02-22 (×4): qty 2

## 2015-02-22 MED ORDER — HEPARIN SODIUM (PORCINE) 5000 UNIT/ML IJ SOLN
5000.0000 [IU] | Freq: Three times a day (TID) | INTRAMUSCULAR | Status: DC
  Administered 2015-02-22 – 2015-02-26 (×11): 5000 [IU] via SUBCUTANEOUS
  Filled 2015-02-22 (×14): qty 1

## 2015-02-22 MED ORDER — ONDANSETRON HCL 4 MG/2ML IJ SOLN: 4.0000 mg | Freq: Four times a day (QID) | INTRAMUSCULAR | Status: DC | PRN

## 2015-02-22 MED ORDER — CLONAZEPAM 0.5 MG PO TABS
0.2500 mg | ORAL_TABLET | Freq: Two times a day (BID) | ORAL | Status: DC | PRN
  Administered 2015-02-22 – 2015-02-24 (×3): 0.25 mg via ORAL
  Filled 2015-02-22 (×4): qty 1

## 2015-02-22 NOTE — Progress Notes (Signed)
Patient is a direct admission from clinic admitted for new onset heart failure. Telemetry placed on patient. Standing weight and vitals obtained. Pt denies pain. Complains of SOB with exertion only. Family is at bedside. IV insertion attempted x2 by RN with no success. IV team consult placed. Oriented patient to room, bed, and call bell. Bed is in low position. Will continue to monitor.

## 2015-02-22 NOTE — Patient Instructions (Addendum)
  We are admitting you to the hospital today

## 2015-02-22 NOTE — Progress Notes (Signed)
Previous echo (12/12/2008) showed EF of 60%. Today's echo showed an EF to be 15-20%. Dr. Meda Coffee (DOD) was notified and the patient is seeing Truitt Merle as we speak. 02/22/2015

## 2015-02-22 NOTE — Progress Notes (Addendum)
CARDIOLOGY OFFICE NOTE  Date:  02/22/2015    [name] Date of Birth: Mar 21, 1971 Medical Record #[id number]  PCP:  Gar Ponto, MD  Cardiologist:  Bronson Ing (to see 03/05/15)   Chief Complaint  Patient presents with  . Edema    New patient visit - seen for Dr. Meda Coffee    History of Present Illness: Mary Pratt is a 44 y.o. female who presents today for a new patient/work in visit. She has been referred to Dr. Bronson Ing - to see him on 03/05/15 She has no prior cardiac history. Remote echo with normal EF. She has a history of GERD, prior esophageal dilatation and asthma. 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   Seen by Dr. Carlean Purl earlier this month with swelling and DOE. He ordered lab and an echo and CXR. BNP was elevated. He started her on low dose potassium and referred here. She is to see Dr. Bronson Ing on 03/05/15.   Presented to our office for echo today - EF is 15%. She is short of breath and has swelling. Thus added to the FLEX. BNP last week elevated.  Comes in today. Here with her mom. She has not felt well over the past month. She is bloated and having swelling. Has seen her PCP - got sent for an abdominal ultrasound. Reportedly ok. Was short of breath - thought it might be her asthma acting up - was put on Advair and then given a steroid dose pak. Saw GI (she says as last resort) due to abdominal bloating - thought it was her GERD - echo today and EF is 15%.  She is quite symptomatic. Weight is up. Some occasional chest pain. She has a dry cough - especially if tries to lay down. No palpitations. No syncope. Can't lie down at night. Has to sit up. Has had no sleep. She is fatigued from not getting any sleep. Her son died (we did not discuss the specifics). She admits to lots of stress with his death and has not dealt well with this. Very swollen. Can hardly get her shoes on. Says she "just can't keep on like this".   Past Medical History    Diagnosis Date  . GERD with stricture     dilated 2010  . Helicobacter pylori gastritis 2010    treated w/ Pylera  . Asthma   . Functional dyspepsia     early satiety   . 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 Sickle cell trait   . HELICOBACTER PYLORI GASTRITIS 01/15/2009    dyspnea, ? reaction to Biaxin/amoxicillin  Pylera 01/15/09 - completed therapy       Past Surgical History  Procedure Laterality Date  . Wisdom tooth extraction    . Esophagogastroduodenoscopy  11/30/2008    esophageal ring dilated 69 French, H. pylori gastritis  . Tubal ligation  1991  . Colposcopy  2013    neg     Medications: Current Outpatient Prescriptions  Medication Sig Dispense Refill  . albuterol (VENTOLIN HFA) 108 (90 BASE) MCG/ACT inhaler Inhale 2 puffs into the lungs every 6 (six) hours as needed. 3 Inhaler 4  . clonazePAM (KLONOPIN) 0.5 MG tablet Take 0.25 mg by mouth 2 (two) times daily as needed for anxiety.    . DULoxetine (CYMBALTA) 60 MG capsule Take 60 mg by mouth daily.    . Fluticasone-Salmeterol (ADVAIR DISKUS) 250-50 MCG/DOSE AEPB Inhale 1 puff  into the lungs 2 (two) times daily. 180 each 4  . loratadine (CLARITIN) 10 MG tablet Take 10 mg by mouth daily.    . NON FORMULARY ALLERGY INJECTIONS.  2 shots twice weekly    . pantoprazole (PROTONIX) 40 MG tablet Take 40 mg by mouth 2 (two) times daily.     . potassium chloride SA (K-DUR,KLOR-CON) 20 MEQ tablet Take 1 tablet (20 mEq total) by mouth daily. 30 tablet 1  . QNASL 80 MCG/ACT AERS     . traZODone (DESYREL) 150 MG tablet Take 150 mg by mouth at bedtime.     No current facility-administered medications for this visit.    Allergies: Allergies  Allergen Reactions  . Amoxicillin     REACTION: Tacycardia  . Clarithromycin     Social History: The patient  reports that she has never smoked. She has never used smokeless tobacco. She reports that she does not drink alcohol or use illicit drugs.   Family History: The  patient's family history includes Brain cancer in her maternal uncle; Breast cancer in her maternal aunt; Diabetes in her mother; Hypertension in her mother; Hypothyroidism in her mother; Lung cancer in her maternal grandmother. There is no history of Colon cancer, Colon polyps, or Esophageal cancer.   Review of Systems: Please see the history of present illness.     All other systems are reviewed and negative.   Physical Exam: VS:  BP 148/100 mmHg  Pulse 110  Ht 5\' 1"  (1.549 m)  Wt 150 lb 6.4 oz (68.221 kg)  BMI 28.43 kg/m2  SpO2 98%  LMP 02/04/2015 (Approximate) .  BMI Body mass index is 28.43 kg/(m^2).  Wt Readings from Last 3 Encounters:  02/22/15 150 lb 6.4 oz (68.221 kg)  02/18/15 146 lb 6 oz (66.395 kg)  01/21/15 141 lb (63.957 kg)    General: Pleasant. She is very edematous. Belly is bloated. She is in no acute distress. Weight is up 9 pounds in 4 weeks.  HEENT: Normal. Neck: Supple, + JVD, carotid bruits, or masses noted.  Cardiac: Regular rate and rhythm. She is tachycardic. Has an S3 noted. Over 2+ edema.  Respiratory:  Lungs are clear to auscultation bilaterally with normal work of breathing.  GI: Soft but very distended. MS: No deformity or atrophy. Gait and ROM intact. Skin: Warm and dry. Color is normal.  Neuro:  Strength and sensation are intact and no gross focal deficits noted.  Psych: Alert, appropriate and with normal affect.   LABORATORY DATA:  EKG:  EKG is ordered today. This demonstrates sinus tach with LBBB - no old tracing to compare to.  Lab Results  Component Value Date   WBC 7.2 02/18/2015   HGB 12.1 02/18/2015   HCT 37.3 02/18/2015   PLT 224.0 02/18/2015   GLUCOSE 83 02/18/2015   ALT 15 05/15/2009   AST 20 05/15/2009   NA 137 02/18/2015   K 3.3* 02/18/2015   CL 102 02/18/2015   CREATININE 0.95 02/18/2015   BUN 12 02/18/2015   CO2 30 02/18/2015   TSH 1.56 03/06/2010    BNP (last 3 results) No results for input(s): BNP in the last 8760  hours.  ProBNP (last 3 results)  Recent Labs  02/18/15 0933  PROBNP 1341.0*     Other Studies Reviewed Today:  Preliminary echo today - EF of 15%.   ECHO SUMMARY FROM 2010 - Overall left ventricular systolic function was normal. Left    ventricular ejection fraction was estimated to  be 60 %. There    were no left ventricular regional wall motion abnormalities.    Left ventricular diastolic function parameters were normal.   CHEST 2 VIEW  COMPARISON: None.  FINDINGS: Cardiomegaly with pulmonary vascular prominence and diffuse mild interstitial prominence with small pleural effusions noted. No pneumothorax. No acute osseous abnormality.  IMPRESSION: Findings consistent with congestive heart failure mild interstitial edema and small pleural effusions.  Electronically Signed  By: Marcello Moores Register  On: 02/18/2015 09:33    Assessment/Plan: 1. Acute systolic heart failure - she is quite symptomatic with diffuse volume overload. She is NYHA III/IV at this time.  I do not think we can turn this around at home - have recommended admission to the CHF unit for IV diuresis and to start working thru the etiology of this. I wonder if this might be Takotsubo. Needs labs. Probably needs cardiac cath as well - early next week.   Patient is seen with Dr. Meda Coffee who is in agreement with this plan.   2. Situational stress  3. Elevated BP - she does not have a history of HTN.   4. GERD  Current medicines are reviewed with the patient today.  The patient does not have concerns regarding medicines other than what has been noted above.  The following changes have been made:  See above.  Labs/ tests ordered today include:    Orders Placed This Encounter  Procedures  . EKG 12-Lead     Disposition:   Admitting to [facility].   Patient is agreeable to this plan and will call if any problems develop in the interim.   Signed: Burtis Junes, RN,  ANP-C 02/22/2015 3:00 PM  [facility] [address] [place], [place]  [address] Phone: [phone] Fax: [phone]

## 2015-02-22 NOTE — H&P (Signed)
Mary Pratt  02/22/2015 2:00 PM  Office Visit  MRN:  [id number]   Description: Female DOB: March 02, 1971  Provider: Burtis Junes, NP  Department: [facility]       Vital Signs  Most recent update: 02/22/2015 2:32 PM by Emmaline Life, RN    BP Pulse Ht Wt BMI SpO2    148/100 mmHg 110 5\' 1"  (1.549 m) 150 lb 6.4 oz (68.221 kg) 28.43 kg/m2 98%    LMP              02/04/2015 (Approximate)         Vitals History     Progress Notes      Burtis Junes, NP at 02/22/2015 2:17 PM     Status: Addendum       Expand All Collapse All       CARDIOLOGY OFFICE NOTE  Date: 02/22/2015    Mary Pratt Date of Birth: 04-11-1971 Medical Record #[id number]  PCP: Gar Ponto, MD Cardiologist: Bronson Ing (to see 03/05/15)  Chief Complaint  Patient presents with  . Edema    New patient visit - seen for Dr. Meda Coffee    History of Present Illness: Mary Pratt is a 44 y.o. female who presents today for a new patient/work in visit. She has been referred to Dr. Bronson Ing - to see him on 03/05/15 She has no prior cardiac history. Remote echo with normal EF. She has a history of GERD, prior esophageal dilatation and asthma. 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   Seen by Dr. Carlean Purl earlier this month with swelling and DOE. He ordered lab and an echo and CXR. BNP was elevated. He started her on low dose potassium and referred here. She is to see Dr. Bronson Ing on 03/05/15.   Presented to our office for echo today - EF is 15%. She is short of breath and has swelling. Thus added to the FLEX. BNP last week elevated.  Comes in today. Here with her mom. She has not felt well over the past month. She is bloated and having swelling. Has seen her PCP - got sent for an abdominal ultrasound. Reportedly ok. Was short of breath - thought it might be her asthma acting up - was put on Advair and then given a steroid dose pak. Saw GI (she  says as last resort) due to abdominal bloating - thought it was her GERD - echo today and EF is 15%.  She is quite symptomatic. Weight is up. Some occasional chest pain. She has a dry cough - especially if tries to lay down. No palpitations. No syncope. Can't lie down at night. Has to sit up. Has had no sleep. She is fatigued from not getting any sleep. Her son died (we did not discuss the specifics). She admits to lots of stress with his death and has not dealt well with this. Very swollen. Can hardly get her shoes on. Says she "just can't keep on like this".   Past Medical History  Diagnosis Date  . GERD with stricture     dilated 2010  . Helicobacter pylori gastritis 2010    treated w/ Pylera  . Asthma   . Functional dyspepsia     early satiety   . 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 Sickle cell trait   . HELICOBACTER PYLORI GASTRITIS 01/15/2009    dyspnea, ? reaction to Biaxin/amoxicillin Pylera 01/15/09 -  completed therapy     Past Surgical History  Procedure Laterality Date  . Wisdom tooth extraction    . Esophagogastroduodenoscopy  11/30/2008    esophageal ring dilated 41 French, H. pylori gastritis  . Tubal ligation  1991  . Colposcopy  2013    neg     Medications: Current Outpatient Prescriptions  Medication Sig Dispense Refill  . albuterol (VENTOLIN HFA) 108 (90 BASE) MCG/ACT inhaler Inhale 2 puffs into the lungs every 6 (six) hours as needed. 3 Inhaler 4  . clonazePAM (KLONOPIN) 0.5 MG tablet Take 0.25 mg by mouth 2 (two) times daily as needed for anxiety.    . DULoxetine (CYMBALTA) 60 MG capsule Take 60 mg by mouth daily.    . Fluticasone-Salmeterol (ADVAIR DISKUS) 250-50 MCG/DOSE AEPB Inhale 1 puff into the lungs 2 (two) times daily. 180 each 4  . loratadine (CLARITIN) 10 MG tablet Take 10 mg by mouth daily.    . NON FORMULARY ALLERGY INJECTIONS. 2  shots twice weekly    . pantoprazole (PROTONIX) 40 MG tablet Take 40 mg by mouth 2 (two) times daily.     . potassium chloride SA (K-DUR,KLOR-CON) 20 MEQ tablet Take 1 tablet (20 mEq total) by mouth daily. 30 tablet 1  . QNASL 80 MCG/ACT AERS     . traZODone (DESYREL) 150 MG tablet Take 150 mg by mouth at bedtime.     No current facility-administered medications for this visit.    Allergies: Allergies  Allergen Reactions  . Amoxicillin     REACTION: Tacycardia  . Clarithromycin     Social History: The patient  reports that she has never smoked. She has never used smokeless tobacco. She reports that she does not drink alcohol or use illicit drugs.  Family History: The patient's family history includes Brain cancer in her maternal uncle; Breast cancer in her maternal aunt; Diabetes in her mother; Hypertension in her mother; Hypothyroidism in her mother; Lung cancer in her maternal grandmother. There is no history of Colon cancer, Colon polyps, or Esophageal cancer.   Review of Systems: Please see the history of present illness. All other systems are reviewed and negative.   Physical Exam: VS: BP 148/100 mmHg  Pulse 110  Ht 5\' 1"  (1.549 m)  Wt 150 lb 6.4 oz (68.221 kg)  BMI 28.43 kg/m2  SpO2 98%  LMP 02/04/2015 (Approximate) . BMI Body mass index is 28.43 kg/(m^2).  Wt Readings from Last 3 Encounters:  02/22/15 150 lb 6.4 oz (68.221 kg)  02/18/15 146 lb 6 oz (66.395 kg)  01/21/15 141 lb (63.957 kg)    General: Pleasant. She is very edematous. Belly is bloated. She is in no acute distress. Weight is up 9 pounds in 4 weeks.  HEENT: Normal.  Neck: Supple, + JVD, carotid bruits, or masses noted.  Cardiac: Regular rate and rhythm. She is tachycardic. Has an S3 noted. Over 2+ edema.  Respiratory: Lungs are clear to auscultation bilaterally with normal work of breathing.  GI: Soft but very distended. MS: No deformity or  atrophy. Gait and ROM intact.  Skin: Warm and dry. Color is normal.  Neuro: Strength and sensation are intact and no gross focal deficits noted.  Psych: Alert, appropriate and with normal affect.   LABORATORY DATA:  EKG: EKG is ordered today. This demonstrates sinus tach with LBBB - no old tracing to compare to.   Recent Labs    Lab Results  Component Value Date   WBC 7.2 02/18/2015  HGB 12.1 02/18/2015   HCT 37.3 02/18/2015   PLT 224.0 02/18/2015   GLUCOSE 83 02/18/2015   ALT 15 05/15/2009   AST 20 05/15/2009   NA 137 02/18/2015   K 3.3* 02/18/2015   CL 102 02/18/2015   CREATININE 0.95 02/18/2015   BUN 12 02/18/2015   CO2 30 02/18/2015   TSH 1.56 03/06/2010      BNP (last 3 results)  Recent Labs (within last 365 days)    No results for input(s): BNP in the last 8760 hours.    ProBNP (last 3 results)  Recent Labs (within last 365 days)     Recent Labs  02/18/15 0933  PROBNP 1341.0*       Other Studies Reviewed Today:  Preliminary echo today - EF of 15%.   ECHO SUMMARY FROM 2010 - Overall left ventricular systolic function was normal. Left    ventricular ejection fraction was estimated to be 60 %. There    were no left ventricular regional wall motion abnormalities.    Left ventricular diastolic function parameters were normal.   CHEST 2 VIEW  COMPARISON: None.  FINDINGS: Cardiomegaly with pulmonary vascular prominence and diffuse mild interstitial prominence with small pleural effusions noted. No pneumothorax. No acute osseous abnormality.  IMPRESSION: Findings consistent with congestive heart failure mild interstitial edema and small pleural effusions.  Electronically Signed  By: Marcello Moores Register  On: 02/18/2015 09:33    Assessment/Plan: 1. Acute systolic heart failure - she is quite symptomatic with diffuse volume overload. She is NYHA III/IV at  this time. I do not think we can turn this around at home - have recommended admission to the CHF unit for IV diuresis and to start working thru the etiology of this. I wonder if this might be Takotsubo. Needs labs. Probably needs cardiac cath as well - early next week.   Patient is seen with Dr. Meda Coffee who is in agreement with this plan.   2. Situational stress  3. Elevated BP - she does not have a history of HTN.   4. GERD  Current medicines are reviewed with the patient today. The patient does not have concerns regarding medicines other than what has been noted above.  The following changes have been made: See above.  Labs/ tests ordered today include:   Orders Placed This Encounter  Procedures  . EKG 12-Lead     Disposition: Admitting to [facility].   Patient is agreeable to this plan and will call if any problems develop in the interim.   Signed: Burtis Junes, RN, ANP-C 02/22/2015 3:00 PM  [facility] [address] [place], [place] [address] Phone: [phone] Fax: [phone]              Revision History       Date/Time User Action    > 02/22/2015 3:18 PM Burtis Junes, NP Addend     02/22/2015 3:02 PM Burtis Junes, NP Sign               Referring Provider     Caryl Bis, MD     Diagnoses     Acute systolic heart failure - Primary    ICD-9-CM: 428.21 ICD-10-CM: I50.21       Reason for Visit     Edema    New patient visit - seen for Dr. Meda Coffee    Reason for Visit History        Level of Service  LOS - NO CHARGE [NC1]   LOS History      Follow-up and Disposition     Routing History    Follow-up and Disposition History        All Charges for This Encounter     Code Description Service Date Service Provider Modifiers Qty    Espy, COMPLETE 02/22/2015 Burtis Junes, NP  1    NC1 LOS - NO CHARGE  02/22/2015 Burtis Junes, NP  1      AVS Reports     No AVS Snapshots are available for this encounter.     Patient Instructions      We are admitting you to the hospital today        Patient Instructions History      Routing History     There are no sent or routed communications associated with this encounter.     Chart Reviewed By     Dorothy Spark, MD on 02/22/2015 3:28 PM     Previous Visit       Provider Department Encounter #    02/22/2015 2:00 PM Truitt Merle, NP Johnnette Litter [facility] [id number]

## 2015-02-23 DIAGNOSIS — I5021 Acute systolic (congestive) heart failure: Principal | ICD-10-CM

## 2015-02-23 LAB — BASIC METABOLIC PANEL
Anion gap: 7 (ref 5–15)
BUN: 14 mg/dL (ref 6–20)
CO2: 27 mmol/L (ref 22–32)
Calcium: 8.7 mg/dL — ABNORMAL LOW (ref 8.9–10.3)
Chloride: 104 mmol/L (ref 101–111)
Creatinine, Ser: 1.14 mg/dL — ABNORMAL HIGH (ref 0.44–1.00)
GFR calc Af Amer: >60 mL/min (ref >60)
GFR calc non Af Amer: 57 mL/min — ABNORMAL LOW (ref >60)
Glucose, Bld: 104 mg/dL — ABNORMAL HIGH (ref 65–99)
Potassium: 4.2 mmol/L (ref 3.5–5.1)
Sodium: 138 mmol/L (ref 135–145)

## 2015-02-23 LAB — URINE DRUGS OF ABUSE SCREEN W ALC, ROUTINE (REF LAB)
Amphetamines, Urine: NEGATIVE ng/mL
Barbiturate, Ur: NEGATIVE ng/mL
Benzodiazepine Quant, Ur: NEGATIVE ng/mL
Cannabinoid Quant, Ur: NEGATIVE ng/mL
Cocaine (Metab.): NEGATIVE ng/mL
Ethanol U, Quan: NEGATIVE %
Methadone Screen, Urine: NEGATIVE ng/mL
Opiate Quant, Ur: NEGATIVE ng/mL
Phencyclidine, Ur: NEGATIVE ng/mL
Propoxyphene, Urine: NEGATIVE ng/mL

## 2015-02-23 LAB — TROPONIN I
Troponin I: 0.04 ng/mL — ABNORMAL HIGH (ref <0.031)
Troponin I: 0.03 ng/mL (ref <0.031)

## 2015-02-23 NOTE — Progress Notes (Signed)
Patient ID: Mary Pratt, female   DOB: [birth year], 44 y.o.   MRN: [id number]    Patient Name: Mary Pratt Date of Encounter: 02/23/2015     Active Problems:   Acute systolic heart failure    SUBJECTIVE  Dyspnea improved. Leg swelling better. No chest pain.  CURRENT MEDS . aspirin EC  81 mg Oral Daily  . carvedilol  3.125 mg Oral BID WC  . furosemide  60 mg Intravenous BID  . heparin  5,000 Units Subcutaneous 3 times per day  . lisinopril  2.5 mg Oral Daily  . pantoprazole  40 mg Oral BID  . potassium chloride  20 mEq Oral BID  . sodium chloride  3 mL Intravenous Q12H    OBJECTIVE  Filed Vitals:   02/22/15 2102 02/22/15 2317 02/23/15 0153 02/23/15 0643  BP: 143/112 136/109 138/99 139/110  Pulse: 107 101 97 91  Temp: 99.3 F (37.4 C)  98.3 F (36.8 C) 98.2 F (36.8 C)  TempSrc: Oral  Oral Oral  Resp: 20  20 20   Height:      Weight:    144 lb 14.4 oz (65.726 kg)  SpO2: 97%  99% 97%    Intake/Output Summary (Last 24 hours) at 02/23/15 0946 Last data filed at 02/23/15 0838  Gross per 24 hour  Intake    680 ml  Output   1600 ml  Net   -920 ml   Filed Weights   02/22/15 1613 02/23/15 0643  Weight: 150 lb 9.2 oz (68.3 kg) 144 lb 14.4 oz (65.726 kg)    PHYSICAL EXAM  General: Pleasant, anxious appearing, NAD. Neuro: Alert and oriented X 3. Moves all extremities spontaneously. Psych: Normal affect. HEENT:  Normal  Neck: Supple without bruits, 8 cm jvd Lungs:  Resp regular and unlabored, CTA. Heart: RRR with a soft s3, no murmurs. Abdomen: Soft, non-tender, non-distended, BS + x 4.  Extremities: No clubbing, cyanosis or edema. DP/PT/Radials 2+ and equal bilaterally.  Accessory Clinical Findings  CBC  Recent Labs  02/22/15 1717  WBC 6.9  NEUTROABS 4.6  HGB 11.6*  HCT 34.5*  MCV 83.9  PLT 502   Basic Metabolic Panel  Recent Labs  02/22/15 1717 02/23/15 0437  NA 138 138  K 4.4 4.2  CL 106 104  CO2 24 27  GLUCOSE 92 104*  BUN 14 14    CREATININE 1.02* 1.14*  CALCIUM 8.7* 8.7*  MG 1.9  --    Liver Function Tests  Recent Labs  02/22/15 1717  AST 46*  ALT 55*  ALKPHOS 75  BILITOT 0.9  PROT 6.4*  ALBUMIN 3.2*   No results for input(s): LIPASE, AMYLASE in the last 72 hours. Cardiac Enzymes  Recent Labs  02/22/15 1717 02/22/15 2312 02/23/15 0437  TROPONINI 0.04* 0.04* 0.03   BNP Invalid input(s): POCBNP D-Dimer No results for input(s): DDIMER in the last 72 hours. Hemoglobin A1C No results for input(s): HGBA1C in the last 72 hours. Fasting Lipid Panel No results for input(s): CHOL, HDL, LDLCALC, TRIG, CHOLHDL, LDLDIRECT in the last 72 hours. Thyroid Function Tests  Recent Labs  02/22/15 1717  TSH 1.663    TELE  NSR/sinus tachycardia  Radiology/Studies  Dg Chest 2 View  02/18/2015   CLINICAL DATA:  Shortness of breath.  Cough.  EXAM: CHEST  2 VIEW  COMPARISON:  None.  FINDINGS: Cardiomegaly with pulmonary vascular prominence and diffuse mild interstitial prominence with small pleural effusions noted. No pneumothorax. No acute osseous abnormality.  IMPRESSION: Findings consistent with congestive heart failure mild interstitial edema and small pleural effusions.   Electronically Signed   By: Marcello Moores  Register   On: 02/18/2015 09:33    ASSESSMENT AND PLAN  1. Acute systolic heart failure 2. HTN Rec: will continue IV lasix and plan to uptitrate her ACE and beta blocker as tolerated. I would be inclined to consider Coronary CT scan rather than left heart cath at this time.  [name],M.D.  02/23/2015 9:46 AM

## 2015-02-24 ENCOUNTER — Other Ambulatory Visit: Payer: Self-pay

## 2015-02-24 LAB — BASIC METABOLIC PANEL
Anion gap: 8 (ref 5–15)
BUN: 15 mg/dL (ref 6–20)
CO2: 28 mmol/L (ref 22–32)
Calcium: 9 mg/dL (ref 8.9–10.3)
Chloride: 103 mmol/L (ref 101–111)
Creatinine, Ser: 1.24 mg/dL — ABNORMAL HIGH (ref 0.44–1.00)
GFR calc Af Amer: 60 mL/min — ABNORMAL LOW (ref >60)
GFR calc non Af Amer: 52 mL/min — ABNORMAL LOW (ref >60)
Glucose, Bld: 102 mg/dL — ABNORMAL HIGH (ref 65–99)
Potassium: 4.2 mmol/L (ref 3.5–5.1)
Sodium: 139 mmol/L (ref 135–145)

## 2015-02-24 MED ORDER — CARVEDILOL 3.125 MG PO TABS
3.1250 mg | ORAL_TABLET | Freq: Two times a day (BID) | ORAL | Status: DC
  Filled 2015-02-24 (×3): qty 1

## 2015-02-24 NOTE — Progress Notes (Signed)
Patient ID: Mary Pratt, female   DOB: 1971-02-25, 44 y.o.   MRN: [id number]    SUBJECTIVE: The patient is doing well today.  At this time, she denies chest pain, shortness of breath, or any new concerns.  Marland Kitchen aspirin EC  81 mg Oral Daily  . carvedilol  3.125 mg Oral BID WC  . furosemide  60 mg Intravenous BID  . heparin  5,000 Units Subcutaneous 3 times per day  . lisinopril  2.5 mg Oral Daily  . pantoprazole  40 mg Oral BID  . potassium chloride  20 mEq Oral BID  . sodium chloride  3 mL Intravenous Q12H      OBJECTIVE: Physical Exam: Filed Vitals:   02/23/15 1354 02/23/15 2024 02/24/15 0123 02/24/15 0546  BP: 122/95 118/93 126/95 142/101  Pulse: 96 91 99 98  Temp: 97.6 F (36.4 C) 97.9 F (36.6 C) 98.4 F (36.9 C) 98.4 F (36.9 C)  TempSrc: Oral Oral Oral Oral  Resp: 20 18 16 16   Height:      Weight:    140 lb 3.4 oz (63.6 kg)  SpO2: 98% 98% 99% 100%    Intake/Output Summary (Last 24 hours) at 02/24/15 0927 Last data filed at 02/24/15 0816  Gross per 24 hour  Intake   1320 ml  Output   1575 ml  Net   -255 ml    Telemetry reveals sinus rhythm  GEN- The patient is well appearing, alert and oriented x 3 today.   Head- normocephalic, atraumatic Eyes-  Sclera clear, conjunctiva pink Ears- hearing intact Oropharynx- clear Neck- supple, no JVP Lymph- no cervical lymphadenopathy Lungs- Clear to ausculation bilaterally, normal work of breathing Heart- Regular rate and rhythm, no murmurs, rubs or gallops, PMI not laterally displaced GI- soft, NT, ND, + BS Extremities- no clubbing, cyanosis, 1+ edema bilaterally Skin- no rash or lesion Psych- euthymic mood, full affect Neuro- strength and sensation are intact  LABS: Basic Metabolic Panel:  Recent Labs  02/22/15 1717 02/23/15 0437 02/24/15 0450  NA 138 138 139  K 4.4 4.2 4.2  CL 106 104 103  CO2 24 27 28   GLUCOSE 92 104* 102*  BUN 14 14 15   CREATININE 1.02* 1.14* 1.24*  CALCIUM 8.7* 8.7* 9.0  MG 1.9   --   --    Liver Function Tests:  Recent Labs  02/22/15 1717  AST 46*  ALT 55*  ALKPHOS 75  BILITOT 0.9  PROT 6.4*  ALBUMIN 3.2*   No results for input(s): LIPASE, AMYLASE in the last 72 hours. CBC:  Recent Labs  02/22/15 1717  WBC 6.9  NEUTROABS 4.6  HGB 11.6*  HCT 34.5*  MCV 83.9  PLT 260   Cardiac Enzymes:  Recent Labs  02/22/15 1717 02/22/15 2312 02/23/15 0437  TROPONINI 0.04* 0.04* 0.03   BNP: Invalid input(s): POCBNP D-Dimer: No results for input(s): DDIMER in the last 72 hours. Hemoglobin A1C: No results for input(s): HGBA1C in the last 72 hours. Fasting Lipid Panel: No results for input(s): CHOL, HDL, LDLCALC, TRIG, CHOLHDL, LDLDIRECT in the last 72 hours. Thyroid Function Tests:  Recent Labs  02/22/15 1717  TSH 1.663   Anemia Panel: No results for input(s): VITAMINB12, FOLATE, FERRITIN, TIBC, IRON, RETICCTPCT in the last 72 hours.  RADIOLOGY: Dg Chest 2 View  02/18/2015   CLINICAL DATA:  Shortness of breath.  Cough.  EXAM: CHEST  2 VIEW  COMPARISON:  None.  FINDINGS: Cardiomegaly with pulmonary vascular prominence and diffuse mild  interstitial prominence with small pleural effusions noted. No pneumothorax. No acute osseous abnormality.  IMPRESSION: Findings consistent with congestive heart failure mild interstitial edema and small pleural effusions.   Electronically Signed   By: Marcello Moores  Register   On: 02/18/2015 09:33    ASSESSMENT AND PLAN:  Active Problems:   Acute systolic heart failure Her weight is continuing down and dyspnea much improved. She initially was schedule for left heart cath. However, I think CT or coronary arteries more appropriate. Her risk of obstructive CAD is low. Will schedule.  Cristopher Peru, MD 02/24/2015 9:27 AM

## 2015-02-24 NOTE — Progress Notes (Signed)
No return phone call. PRN meds effective. No verbal complaints and no signs or symptoms of distress or discomfort. Will continue to monitor patient for further changes in condition.

## 2015-02-24 NOTE — Progress Notes (Signed)
Patient complained of chest pain, rating it at a 3, on a 0-10 pain rating scale. STAT EKG obtained. BP 130/89 and HR 93. Tylenol was administered along with klonopin. PA paged. Will continue to monitor patient for further changes in condition.

## 2015-02-25 ENCOUNTER — Inpatient Hospital Stay (HOSPITAL_COMMUNITY): Payer: BLUE CROSS/BLUE SHIELD

## 2015-02-25 ENCOUNTER — Encounter (HOSPITAL_COMMUNITY)
Admission: AD | Disposition: A | Discharge status: Home / Self Care | Payer: Self-pay | Source: Ambulatory Visit | Attending: Cardiology

## 2015-02-25 ENCOUNTER — Ambulatory Visit (HOSPITAL_COMMUNITY): Admission: RE | Admit: 2015-02-25 | Payer: BLUE CROSS/BLUE SHIELD | Source: Ambulatory Visit | Admitting: Cardiology

## 2015-02-25 ENCOUNTER — Encounter (HOSPITAL_COMMUNITY): Payer: Self-pay

## 2015-02-25 LAB — BASIC METABOLIC PANEL
Anion gap: 8 (ref 5–15)
BUN: 14 mg/dL (ref 6–20)
CO2: 29 mmol/L (ref 22–32)
Calcium: 9.1 mg/dL (ref 8.9–10.3)
Chloride: 99 mmol/L — ABNORMAL LOW (ref 101–111)
Creatinine, Ser: 1.27 mg/dL — ABNORMAL HIGH (ref 0.44–1.00)
GFR calc Af Amer: 58 mL/min — ABNORMAL LOW (ref >60)
GFR calc non Af Amer: 50 mL/min — ABNORMAL LOW (ref >60)
Glucose, Bld: 102 mg/dL — ABNORMAL HIGH (ref 65–99)
Potassium: 3.9 mmol/L (ref 3.5–5.1)
Sodium: 136 mmol/L (ref 135–145)

## 2015-02-25 SURGERY — RIGHT/LEFT HEART CATH AND CORONARY ANGIOGRAPHY
Anesthesia: LOCAL

## 2015-02-25 MED ORDER — METOPROLOL TARTRATE 1 MG/ML IV SOLN
INTRAVENOUS | Status: AC
  Filled 2015-02-25: qty 10

## 2015-02-25 MED ORDER — METOPROLOL TARTRATE 1 MG/ML IV SOLN
5.0000 mg | Freq: Once | INTRAVENOUS | Status: AC
  Administered 2015-02-25: 5 mg via INTRAVENOUS

## 2015-02-25 MED ORDER — METOPROLOL TARTRATE 1 MG/ML IV SOLN
INTRAVENOUS | Status: AC
  Filled 2015-02-25: qty 5

## 2015-02-25 MED ORDER — NITROGLYCERIN 0.4 MG SL SUBL
SUBLINGUAL_TABLET | SUBLINGUAL | Status: AC
  Filled 2015-02-25: qty 1

## 2015-02-25 MED ORDER — CARVEDILOL 6.25 MG PO TABS
6.2500 mg | ORAL_TABLET | Freq: Two times a day (BID) | ORAL | Status: DC
  Administered 2015-02-25: 6.25 mg via ORAL
  Filled 2015-02-25 (×4): qty 1

## 2015-02-25 MED ORDER — IOHEXOL 350 MG/ML SOLN
80.0000 mL | Freq: Once | INTRAVENOUS | Status: AC | PRN
  Administered 2015-02-25: 80 mL via INTRAVENOUS

## 2015-02-25 MED ORDER — CARVEDILOL 3.125 MG PO TABS
3.1250 mg | ORAL_TABLET | Freq: Once | ORAL | Status: AC
  Administered 2015-02-25: 3.125 mg via ORAL
  Filled 2015-02-25: qty 1

## 2015-02-25 NOTE — Progress Notes (Signed)
Heart Failure Navigator Consult Note  Presentation: Mary Pratt is a 44 y.o. Female with no prior cardiac history. Remote echo with normal EF. She has a history of GERD, prior esophageal dilatation and asthma. 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   Seen by Dr. Carlean Purl earlier this month with swelling and DOE. He ordered lab and an echo and CXR. BNP was elevated. He started her on low dose potassium and referred here. She is to see Dr. Bronson Ing on 03/05/15.   Presented to our office for echo today - EF is 15%. She is short of breath and has swelling. Thus added to the FLEX. BNP last week elevated.  Comes in today. Here with her mom. She has not felt well over the past month. She is bloated and having swelling. Has seen her PCP - got sent for an abdominal ultrasound. Reportedly ok. Was short of breath - thought it might be her asthma acting up - was put on Advair and then given a steroid dose pak. Saw GI (she says as last resort) due to abdominal bloating - thought it was her GERD - echo today and EF is 15%.  She is quite symptomatic. Weight is up. Some occasional chest pain. She has a dry cough - especially if tries to lay down. No palpitations. No syncope. Can't lie down at night. Has to sit up. Has had no sleep. She is fatigued from not getting any sleep. Her son died (we did not discuss the specifics). She admits to lots of stress with his death and has not dealt well with this. Very swollen. Can hardly get her shoes on. Says she "just can't keep on like this".    Past Medical History  Diagnosis Date  . GERD with stricture     dilated 2010  . Asthma   . Functional dyspepsia     early satiety   . 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 Sickle cell trait   . HELICOBACTER PYLORI GASTRITIS 01/15/2009    dyspnea, ? reaction to Biaxin/amoxicillin  Pylera 01/15/09 - completed therapy     . Anxiety   . Acute systolic heart failure 9/67/5916  . Situational  depression     "son died"  . CHF (congestive heart failure)     History   Social History  . Marital Status: Divorced    Spouse Name: N/A  . Number of Children: 3  . Years of Education: N/A   Occupational History  . texturing operator    Social History Main Topics  . Smoking status: Never Smoker   . Smokeless tobacco: Never Used  . Alcohol Use: No  . Drug Use: No  . Sexual Activity:    Partners: Male    Birth Control/ Protection: Surgical     Comment: BTL   Other Topics Concern  . None   Social History Narrative   Daily caffeine use - <1   2 sons 1 daughter 1 son deceased   Single       ECHO:March 11, 2015 Study Conclusions  - Left ventricle: Diffuse hypokinesis worse in the inferior base. The cavity size was severely dilated. Wall thickness was normal. The estimated ejection fraction was 20%. - Aortic valve: There was trivial regurgitation. - Mitral valve: There was mild regurgitation. - Left atrium: The atrium was moderately dilated. - Atrial septum: No defect or patent foramen ovale was identified. - Tricuspid valve: There was moderate regurgitation. -  Pulmonary arteries: PA peak pressure: 54 mm Hg (S). - Pericardium, extracardiac: A trivial pericardial effusion was identified.  ------------------------------------------------------------------- Labs, prior tests, procedures, and surgery: Transthoracic echocardiography (12/12/2008).   EF was 60%.  Transthoracic echocardiography. M-mode, complete 2D, spectral Doppler, and color Doppler. Birthdate: Patient birthdate: [birth year]. Age: Patient is 44 yr old. Sex: Gender: female. BMI: 28.5 kg/m^2. Blood pressure:   130/86 Patient status: Outpatient. Study date: Study date: 02/22/2015. Study time: 01:48 PM. Location: [facility]  BNP    Component Value Date/Time   BNP 2024.7* 02/22/2015 1717    ProBNP    Component Value Date/Time   PROBNP 1341.0* 02/18/2015 0933     Education  Assessment and Provision:  Detailed education and instructions provided on heart failure disease management including the following:  Signs and symptoms of Heart Failure When to call the physician Importance of daily weights Low sodium diet Fluid restriction Medication management Anticipated future follow-up appointments  Patient education given on each of the above topics.  Patient acknowledges understanding and acceptance of all instructions.  I spoke briefly with Ms. Gault regarding her new HF.  She seems to have a good basic knowledge of her diagnosis.  She was headed for cardiac CT--therefore I will plan to return tomorrow do more teaching.  I will also have dietician come to see her to discuss in detail a low sodium diet.  She and her family were very open to teaching and asked pertinent questions.    Education Materials:  "Living Better With Heart Failure" Booklet, Daily Weight Tracker Tool and Heart Failure Educational Video.   High Risk Criteria for Readmission and/or Poor Patient Outcomes:  (Recommend Follow-up with [facility])--yes -she will follow in [facility] post discharge.   EF <30%- yes 20%  2 or more admissions in 6 months- No new HF  Difficult social situation-Yes -recent death of her son  Demonstrates medication noncompliance- No    Barriers of Care:  Knowledge new HF, compliance  Discharge Planning:  Plans to return home with son in [place]

## 2015-02-25 NOTE — Progress Notes (Signed)
UR COMPLETED  

## 2015-02-25 NOTE — Progress Notes (Signed)
Patient Name: Mary Pratt Date of Encounter: 02/25/2015   Principal Problem:   Acute systolic heart failure Active Problems:   GERD with stricture    SUBJECTIVE  Breathing much improved.  Wt down 17 lbs - below previous dry wt (she thinks she used to be 137 - currently 133 lbs).  For cardiac CTA today.  CURRENT MEDS . aspirin EC  81 mg Oral Daily  . carvedilol  3.125 mg Oral BID WC  . furosemide  60 mg Intravenous BID  . heparin  5,000 Units Subcutaneous 3 times per day  . lisinopril  2.5 mg Oral Daily  . pantoprazole  40 mg Oral BID  . potassium chloride  20 mEq Oral BID  . sodium chloride  3 mL Intravenous Q12H    OBJECTIVE  Filed Vitals:   02/24/15 1418 02/24/15 1743 02/24/15 2054 02/25/15 0620  BP: 125/94 127/96 126/96 132/105  Pulse: 87 108 93 89  Temp: 97.7 F (36.5 C)  98.3 F (36.8 C) 98 F (36.7 C)  TempSrc: Oral  Oral Oral  Resp: 20  20 18   Height:      Weight:    133 lb 9.6 oz (60.601 kg)  SpO2: 92%  94% 95%    Intake/Output Summary (Last 24 hours) at 02/25/15 1008 Last data filed at 02/25/15 0700  Gross per 24 hour  Intake   1060 ml  Output    600 ml  Net    460 ml   Filed Weights   02/23/15 0643 02/24/15 0546 02/25/15 0620  Weight: 144 lb 14.4 oz (65.726 kg) 140 lb 3.4 oz (63.6 kg) 133 lb 9.6 oz (60.601 kg)    PHYSICAL EXAM  General: Pleasant, NAD. Neuro: Alert and oriented X 3. Moves all extremities spontaneously. Psych: Normal affect. HEENT:  Normal  Neck: Supple without bruits.  Neck veins flat. Lungs:  Resp regular and unlabored, CTA. Heart: RRR no s3, s4, or murmurs. Abdomen: Soft, non-tender, non-distended, BS + x 4.  Extremities: No clubbing, cyanosis or edema. DP/PT/Radials 2+ and equal bilaterally.  Accessory Clinical Findings  CBC  Recent Labs  02/22/15 1717  WBC 6.9  NEUTROABS 4.6  HGB 11.6*  HCT 34.5*  MCV 83.9  PLT 419   Basic Metabolic Panel  Recent Labs  02/22/15 1717  02/24/15 0450 02/25/15 0420    NA 138  < > 139 136  K 4.4  < > 4.2 3.9  CL 106  < > 103 99*  CO2 24  < > 28 29  GLUCOSE 92  < > 102* 102*  BUN 14  < > 15 14  CREATININE 1.02*  < > 1.24* 1.27*  CALCIUM 8.7*  < > 9.0 9.1  MG 1.9  --   --   --   < > = values in this interval not displayed. Liver Function Tests  Recent Labs  02/22/15 1717  AST 46*  ALT 55*  ALKPHOS 75  BILITOT 0.9  PROT 6.4*  ALBUMIN 3.2*   Cardiac Enzymes  Recent Labs  02/22/15 1717 02/22/15 2312 02/23/15 0437  TROPONINI 0.04* 0.04* 0.03   Thyroid Function Tests  Recent Labs  02/22/15 1717  TSH 1.663   TELE  RSR  Radiology/Studies  Dg Chest 2 View  02/18/2015   CLINICAL DATA:  Shortness of breath.  Cough.  EXAM: CHEST  2 VIEW  COMPARISON:  None.  FINDINGS: Cardiomegaly with pulmonary vascular prominence and diffuse mild interstitial prominence with small pleural effusions noted.  No pneumothorax. No acute osseous abnormality.  IMPRESSION: Findings consistent with congestive heart failure mild interstitial edema and small pleural effusions.   Electronically Signed   By: Marcello Moores  Register   On: 02/18/2015 09:33    ASSESSMENT AND PLAN  1.  Acute systolic CHF:  ? Etiology (ischemic vs non-ischemic).  Minus 715 ml recorded since admission - inaccurate as wt is down 17 lbs. Currently 133 lbs.  Thinks she was 137 lbs previously.  Feeling much better.  Edema resolved.  Breathing improved.  Creat/bicarb bumping slightly.  Hold lasix today as she is scheduled for cardiac CTA today, and will be receiving contrast.  Resting HR currently too high for CT.  Titrate coreg and will give IV lopressor at time of CT (discussed with Dr. Meda Coffee).  Cont acei.  2.  Mild renal insufficiency:  Creat/bicarb bumping slightly.  Already got lasix this AM.  Will hold evening dose.  3.  GERD:  No complaints.  Cont PPI.  Signed, Murray Hodgkins NP

## 2015-02-26 ENCOUNTER — Encounter (HOSPITAL_COMMUNITY)
Admission: AD | Disposition: A | Discharge status: Home / Self Care | Payer: BLUE CROSS/BLUE SHIELD | Source: Ambulatory Visit | Attending: Cardiology

## 2015-02-26 DIAGNOSIS — K222 Esophageal obstruction: Secondary | ICD-10-CM

## 2015-02-26 DIAGNOSIS — I420 Dilated cardiomyopathy: Secondary | ICD-10-CM

## 2015-02-26 DIAGNOSIS — N179 Acute kidney failure, unspecified: Secondary | ICD-10-CM

## 2015-02-26 DIAGNOSIS — I429 Cardiomyopathy, unspecified: Secondary | ICD-10-CM | POA: Insufficient documentation

## 2015-02-26 DIAGNOSIS — K219 Gastro-esophageal reflux disease without esophagitis: Secondary | ICD-10-CM

## 2015-02-26 HISTORY — PX: CARDIAC CATHETERIZATION: SHX172

## 2015-02-26 LAB — BASIC METABOLIC PANEL
Anion gap: 8 (ref 5–15)
BUN: 15 mg/dL (ref 6–20)
CO2: 24 mmol/L (ref 22–32)
Calcium: 9.3 mg/dL (ref 8.9–10.3)
Chloride: 104 mmol/L (ref 101–111)
Creatinine, Ser: 1.13 mg/dL — ABNORMAL HIGH (ref 0.44–1.00)
GFR calc Af Amer: >60 mL/min (ref >60)
GFR calc non Af Amer: 58 mL/min — ABNORMAL LOW (ref >60)
Glucose, Bld: 111 mg/dL — ABNORMAL HIGH (ref 65–99)
Potassium: 4.6 mmol/L (ref 3.5–5.1)
Sodium: 136 mmol/L (ref 135–145)

## 2015-02-26 LAB — CBC
HCT: 38.4 % (ref 36.0–46.0)
Hemoglobin: 13 g/dL (ref 12.0–15.0)
MCH: 28.3 pg (ref 26.0–34.0)
MCHC: 33.9 g/dL (ref 30.0–36.0)
MCV: 83.7 fL (ref 78.0–100.0)
Platelets: 290 10*3/uL (ref 150–400)
RBC: 4.59 MIL/uL (ref 3.87–5.11)
RDW: 14.1 % (ref 11.5–15.5)
WBC: 7.4 10*3/uL (ref 4.0–10.5)

## 2015-02-26 LAB — POCT I-STAT 3, ART BLOOD GAS (G3+)
Bicarbonate: 26.4 mEq/L — ABNORMAL HIGH (ref 20.0–24.0)
O2 Saturation: 98 %
TCO2: 28 mmol/L (ref 0–100)
pCO2 arterial: 48.5 mmHg — ABNORMAL HIGH (ref 35.0–45.0)
pH, Arterial: 7.343 — ABNORMAL LOW (ref 7.350–7.450)
pO2, Arterial: 122 mmHg — ABNORMAL HIGH (ref 80.0–100.0)

## 2015-02-26 LAB — POCT I-STAT 3, VENOUS BLOOD GAS (G3P V)
Acid-Base Excess: 2 mmol/L (ref 0.0–2.0)
Bicarbonate: 29.1 mEq/L — ABNORMAL HIGH (ref 20.0–24.0)
O2 Saturation: 65 %
TCO2: 31 mmol/L (ref 0–100)
pCO2, Ven: 52.9 mmHg — ABNORMAL HIGH (ref 45.0–50.0)
pH, Ven: 7.348 — ABNORMAL HIGH (ref 7.250–7.300)
pO2, Ven: 36 mmHg (ref 30.0–45.0)

## 2015-02-26 LAB — CREATININE, SERUM
Creatinine, Ser: 1.2 mg/dL — ABNORMAL HIGH (ref 0.44–1.00)
GFR calc Af Amer: >60 mL/min (ref >60)
GFR calc non Af Amer: 54 mL/min — ABNORMAL LOW (ref >60)

## 2015-02-26 SURGERY — RIGHT/LEFT HEART CATH AND CORONARY ANGIOGRAPHY

## 2015-02-26 MED ORDER — SODIUM CHLORIDE 0.9 % IV SOLN: INTRAVENOUS | Status: AC

## 2015-02-26 MED ORDER — HEPARIN (PORCINE) IN NACL 2-0.9 UNIT/ML-% IJ SOLN
INTRAMUSCULAR | Status: AC
  Filled 2015-02-26: qty 1500

## 2015-02-26 MED ORDER — ONDANSETRON HCL 4 MG/2ML IJ SOLN: 4.0000 mg | Freq: Four times a day (QID) | INTRAMUSCULAR | Status: DC | PRN

## 2015-02-26 MED ORDER — FENTANYL CITRATE (PF) 100 MCG/2ML IJ SOLN
INTRAMUSCULAR | Status: DC | PRN
  Administered 2015-02-26: 50 ug via INTRAVENOUS

## 2015-02-26 MED ORDER — MIDAZOLAM HCL 2 MG/2ML IJ SOLN
INTRAMUSCULAR | Status: AC
  Filled 2015-02-26: qty 2

## 2015-02-26 MED ORDER — FENTANYL CITRATE (PF) 100 MCG/2ML IJ SOLN
INTRAMUSCULAR | Status: AC
  Filled 2015-02-26: qty 2

## 2015-02-26 MED ORDER — SODIUM CHLORIDE 0.9 % IV SOLN: 250.0000 mL | INTRAVENOUS | Status: DC | PRN

## 2015-02-26 MED ORDER — LIDOCAINE HCL (PF) 1 % IJ SOLN
INTRAMUSCULAR | Status: AC
  Filled 2015-02-26: qty 30

## 2015-02-26 MED ORDER — LIDOCAINE HCL (PF) 1 % IJ SOLN
INTRAMUSCULAR | Status: DC | PRN
  Administered 2015-02-26: 30 mL via SUBCUTANEOUS

## 2015-02-26 MED ORDER — SODIUM CHLORIDE 0.9 % IJ SOLN: 3.0000 mL | INTRAMUSCULAR | Status: DC | PRN

## 2015-02-26 MED ORDER — MIDAZOLAM HCL 2 MG/2ML IJ SOLN
INTRAMUSCULAR | Status: DC | PRN
  Administered 2015-02-26: 2 mg via INTRAVENOUS
  Administered 2015-02-26: 1 mg via INTRAVENOUS

## 2015-02-26 MED ORDER — IOHEXOL 350 MG/ML SOLN
INTRAVENOUS | Status: DC | PRN
  Administered 2015-02-26: 65 mL via INTRA_ARTICULAR

## 2015-02-26 MED ORDER — SODIUM CHLORIDE 0.9 % IJ SOLN
3.0000 mL | Freq: Two times a day (BID) | INTRAMUSCULAR | Status: DC
  Administered 2015-02-26 – 2015-02-27 (×2): 3 mL via INTRAVENOUS

## 2015-02-26 MED ORDER — ASPIRIN 81 MG PO CHEW: 81.0000 mg | CHEWABLE_TABLET | Freq: Every day | ORAL | Status: DC

## 2015-02-26 MED ORDER — ACETAMINOPHEN 325 MG PO TABS: 650.0000 mg | ORAL_TABLET | ORAL | Status: DC | PRN

## 2015-02-26 MED ORDER — CARVEDILOL 12.5 MG PO TABS
12.5000 mg | ORAL_TABLET | Freq: Two times a day (BID) | ORAL | Status: DC
  Administered 2015-02-26 – 2015-02-27 (×2): 12.5 mg via ORAL
  Filled 2015-02-26 (×5): qty 1

## 2015-02-26 MED ORDER — SODIUM CHLORIDE 0.9 % IJ SOLN
3.0000 mL | Freq: Two times a day (BID) | INTRAMUSCULAR | Status: DC
  Administered 2015-02-26: 3 mL via INTRAVENOUS

## 2015-02-26 MED ORDER — SODIUM CHLORIDE 0.9 % IV SOLN
INTRAVENOUS | Status: DC
  Administered 2015-02-26: 15:00:00 via INTRAVENOUS

## 2015-02-26 MED ORDER — HEPARIN SODIUM (PORCINE) 5000 UNIT/ML IJ SOLN
5000.0000 [IU] | Freq: Three times a day (TID) | INTRAMUSCULAR | Status: DC
  Administered 2015-02-27: 5000 [IU] via SUBCUTANEOUS
  Filled 2015-02-26 (×3): qty 1

## 2015-02-26 SURGICAL SUPPLY — 11 items
CATH INFINITI 5FR MULTPACK ANG (CATHETERS) ×2 IMPLANT
CATH SWAN GANZ 7F STRAIGHT (CATHETERS) ×2 IMPLANT
KIT HEART LEFT (KITS) ×3 IMPLANT
KIT HEART RIGHT NAMIC (KITS) ×2 IMPLANT
PACK CARDIAC CATHETERIZATION (CUSTOM PROCEDURE TRAY) ×3 IMPLANT
SHEATH PINNACLE 5F 10CM (SHEATH) ×2 IMPLANT
SHEATH PINNACLE 7F 10CM (SHEATH) ×2 IMPLANT
SYR MEDRAD MARK V 150ML (SYRINGE) ×3 IMPLANT
TRANSDUCER W/STOPCOCK (MISCELLANEOUS) ×6 IMPLANT
WIRE EMERALD 3MM-J .035X150CM (WIRE) ×2 IMPLANT
WIRE SWAN .025X260CM (WIRE) ×2 IMPLANT

## 2015-02-26 NOTE — Progress Notes (Signed)
Site area: rt groin femoral arterial and venous sheaths Site Prior to Removal:  Level  0 Pressure Applied For:  20 minutes Manual:   yes Patient Status During Pull:  stable Post Pull Site:  Level  0 Post Pull Instructions Given:  yes Post Pull Pulses Present: yes Dressing Applied:  tegaderm Bedrest begins @ 8257 Comments: none

## 2015-02-26 NOTE — Interval H&P Note (Signed)
Cath Lab Visit (complete for each Cath Lab visit)  Clinical Evaluation Leading to the Procedure:   ACS: No.  Non-ACS:    Anginal Classification: CCS III  Anti-ischemic medical therapy: Minimal Therapy (1 class of medications)  Non-Invasive Test Results: No non-invasive testing performed  Prior CABG: No previous CABG      History and Physical Interval Note:  02/26/2015 3:57 PM  [name]  has presented today for surgery, with the diagnosis of cp  The various methods of treatment have been discussed with the patient and family. After consideration of risks, benefits and other options for treatment, the patient has consented to  Procedure(s): Right/Left Heart Cath and Coronary Angiography (N/A) as a surgical intervention .  The patient's history has been reviewed, patient examined, no change in status, stable for surgery.  I have reviewed the patient's chart and labs.  Questions were answered to the patient's satisfaction.     KELLY,THOMAS A

## 2015-02-26 NOTE — H&P (View-Only) (Signed)
Patient Name: Mary Pratt Date of Encounter: 02/26/2015    Principal Problem:   Acute systolic heart failure Active Problems:   GERD with stricture   SUBJECTIVE  Breathing stable.  Feels good.  Unable to have CT done yesterday 2/2 HR's remaining too high.     CURRENT MEDS . aspirin EC  81 mg Oral Daily  . carvedilol  6.25 mg Oral BID WC  . heparin  5,000 Units Subcutaneous 3 times per day  . lisinopril  2.5 mg Oral Daily  . pantoprazole  40 mg Oral BID  . sodium chloride  3 mL Intravenous Q12H    OBJECTIVE  Filed Vitals:   02/25/15 1510 02/25/15 1817 02/25/15 2044 02/26/15 0617  BP: 122/90 114/89 122/93 134/98  Pulse: 82 91 99 89  Temp: 97.7 F (36.5 C)  98.7 F (37.1 C) 97.9 F (36.6 C)  TempSrc: Oral  Oral Oral  Resp: 20  20 18   Height:      Weight:    134 lb (60.782 kg)  SpO2: 98%  100% 99%    Intake/Output Summary (Last 24 hours) at 02/26/15 0850 Last data filed at 02/26/15 0600  Gross per 24 hour  Intake    800 ml  Output    400 ml  Net    400 ml   Filed Weights   02/24/15 0546 02/25/15 0620 02/26/15 0617  Weight: 140 lb 3.4 oz (63.6 kg) 133 lb 9.6 oz (60.601 kg) 134 lb (60.782 kg)    PHYSICAL EXAM  General: Pleasant, NAD. Neuro: Alert and oriented X 3. Moves all extremities spontaneously. Psych: Normal affect. HEENT:  Normal  Neck: Supple without bruits or JVD. Lungs:  Resp regular and unlabored, CTA. Heart: RRR no s3, s4, or murmurs. Abdomen: Soft, non-tender, non-distended, BS + x 4.  Extremities: No clubbing, cyanosis or edema. DP/PT/Radials 2+ and equal bilaterally.  Accessory Clinical Findings  Basic Metabolic Panel  Recent Labs  02/24/15 0450 02/25/15 0420  NA 139 136  K 4.2 3.9  CL 103 99*  CO2 28 29  GLUCOSE 102* 102*  BUN 15 14  CREATININE 1.24* 1.27*  CALCIUM 9.0 9.1    TELE  Sinus rhythm - 80's.  Radiology/Studies  Dg Chest 2 View  02/18/2015   CLINICAL DATA:  Shortness of breath.  Cough.  EXAM: CHEST  2  VIEW  COMPARISON:  None.  FINDINGS: Cardiomegaly with pulmonary vascular prominence and diffuse mild interstitial prominence with small pleural effusions noted. No pneumothorax. No acute osseous abnormality.  IMPRESSION: Findings consistent with congestive heart failure mild interstitial edema and small pleural effusions.   Electronically Signed   By: Marcello Moores  Register   On: 02/18/2015 09:33    ASSESSMENT AND PLAN  1. Acute systolic CHF: ? Etiology (ischemic vs non-ischemic). Wt down 16 lbs since admission.  I/O inaccurate.  Currently 134 lbs.  Thinks she was 137 lbs previously. Feeling much better. Edema resolved. Breathing improved. Lasix held yesterday PM/this AM as Creat/bicarb bumping slightly - pending this AM.  Cardiac CT unable to be performed yesterday 2/2 elevated HR's.  Plan is to try again today.  If unable to get done today, will plan on R and L heart cath this afternoon.  Will titrate coreg further.  Cont acei and consider switching to entresto.  Look to add spiro if creat stable.  Seen by CHF clinic coordinator. **She is enrolled in the VEST trial and we will need to touch base with research prior to  d/c.**  2. Mild renal insufficiency: Lasix on hold in preparation for contrast.  F/U bmet now.  3. GERD: No complaints. Cont PPI.  Signed, Murray Hodgkins NP

## 2015-02-26 NOTE — Research (Signed)
REDS'@Discharge'$  Informed Consent   Subject Name: Mary Pratt  Subject met inclusion and exclusion criteria.  The informed consent form, study requirements and expectations were reviewed with the subject and questions and concerns were addressed prior to the signing of the consent form.  The subject verbalized understanding of the trail requirements.  The subject agreed to participate in the REDS$[email or web address]'@Discharge'[email or web address]$  trial and signed the informed consent.  The informed consent was obtained prior to performance of any protocol-specific procedures for the subject.  A copy of the signed informed consent was given to the subject and a copy was placed in the subject's medical record.  Sandie Ano 02/25/15 15:30

## 2015-02-26 NOTE — Progress Notes (Signed)
Patient Name: Mary Pratt Date of Encounter: 02/26/2015    Principal Problem:   Acute systolic heart failure Active Problems:   GERD with stricture   SUBJECTIVE  Breathing stable.  Feels good.  Unable to have CT done yesterday 2/2 HR's remaining too high.     CURRENT MEDS . aspirin EC  81 mg Oral Daily  . carvedilol  6.25 mg Oral BID WC  . heparin  5,000 Units Subcutaneous 3 times per day  . lisinopril  2.5 mg Oral Daily  . pantoprazole  40 mg Oral BID  . sodium chloride  3 mL Intravenous Q12H    OBJECTIVE  Filed Vitals:   02/25/15 1510 02/25/15 1817 02/25/15 2044 02/26/15 0617  BP: 122/90 114/89 122/93 134/98  Pulse: 82 91 99 89  Temp: 97.7 F (36.5 C)  98.7 F (37.1 C) 97.9 F (36.6 C)  TempSrc: Oral  Oral Oral  Resp: 20  20 18   Height:      Weight:    134 lb (60.782 kg)  SpO2: 98%  100% 99%    Intake/Output Summary (Last 24 hours) at 02/26/15 0850 Last data filed at 02/26/15 0600  Gross per 24 hour  Intake    800 ml  Output    400 ml  Net    400 ml   Filed Weights   02/24/15 0546 02/25/15 0620 02/26/15 0617  Weight: 140 lb 3.4 oz (63.6 kg) 133 lb 9.6 oz (60.601 kg) 134 lb (60.782 kg)    PHYSICAL EXAM  General: Pleasant, NAD. Neuro: Alert and oriented X 3. Moves all extremities spontaneously. Psych: Normal affect. HEENT:  Normal  Neck: Supple without bruits or JVD. Lungs:  Resp regular and unlabored, CTA. Heart: RRR no s3, s4, or murmurs. Abdomen: Soft, non-tender, non-distended, BS + x 4.  Extremities: No clubbing, cyanosis or edema. DP/PT/Radials 2+ and equal bilaterally.  Accessory Clinical Findings  Basic Metabolic Panel  Recent Labs  02/24/15 0450 02/25/15 0420  NA 139 136  K 4.2 3.9  CL 103 99*  CO2 28 29  GLUCOSE 102* 102*  BUN 15 14  CREATININE 1.24* 1.27*  CALCIUM 9.0 9.1    TELE  Sinus rhythm - 80's.  Radiology/Studies  Dg Chest 2 View  02/18/2015   CLINICAL DATA:  Shortness of breath.  Cough.  EXAM: CHEST  2  VIEW  COMPARISON:  None.  FINDINGS: Cardiomegaly with pulmonary vascular prominence and diffuse mild interstitial prominence with small pleural effusions noted. No pneumothorax. No acute osseous abnormality.  IMPRESSION: Findings consistent with congestive heart failure mild interstitial edema and small pleural effusions.   Electronically Signed   By: Marcello Moores  Register   On: 02/18/2015 09:33    ASSESSMENT AND PLAN  1. Acute systolic CHF: ? Etiology (ischemic vs non-ischemic). Wt down 16 lbs since admission.  I/O inaccurate.  Currently 134 lbs.  Thinks she was 137 lbs previously. Feeling much better. Edema resolved. Breathing improved. Lasix held yesterday PM/this AM as Creat/bicarb bumping slightly - pending this AM.  Cardiac CT unable to be performed yesterday 2/2 elevated HR's.  Plan is to try again today.  If unable to get done today, will plan on R and L heart cath this afternoon.  Will titrate coreg further.  Cont acei and consider switching to entresto.  Look to add spiro if creat stable.  Seen by CHF clinic coordinator. **She is enrolled in the VEST trial and we will need to touch base with research prior to  d/c.**  2. Mild renal insufficiency: Lasix on hold in preparation for contrast.  F/U bmet now.  3. GERD: No complaints. Cont PPI.  Signed, Murray Hodgkins NP

## 2015-02-27 ENCOUNTER — Encounter (HOSPITAL_COMMUNITY): Payer: Self-pay | Admitting: Cardiovascular Disease

## 2015-02-27 ENCOUNTER — Other Ambulatory Visit: Payer: Self-pay

## 2015-02-27 DIAGNOSIS — I428 Other cardiomyopathies: Secondary | ICD-10-CM

## 2015-02-27 DIAGNOSIS — I10 Essential (primary) hypertension: Secondary | ICD-10-CM

## 2015-02-27 DIAGNOSIS — I429 Cardiomyopathy, unspecified: Secondary | ICD-10-CM

## 2015-02-27 LAB — BASIC METABOLIC PANEL
Anion gap: 10 (ref 5–15)
BUN: 12 mg/dL (ref 6–20)
CO2: 22 mmol/L (ref 22–32)
Calcium: 8.8 mg/dL — ABNORMAL LOW (ref 8.9–10.3)
Chloride: 105 mmol/L (ref 101–111)
Creatinine, Ser: 1.11 mg/dL — ABNORMAL HIGH (ref 0.44–1.00)
GFR calc Af Amer: >60 mL/min (ref >60)
GFR calc non Af Amer: 59 mL/min — ABNORMAL LOW (ref >60)
Glucose, Bld: 102 mg/dL — ABNORMAL HIGH (ref 65–99)
Potassium: 4.5 mmol/L (ref 3.5–5.1)
Sodium: 137 mmol/L (ref 135–145)

## 2015-02-27 LAB — CBC
HCT: 38.6 % (ref 36.0–46.0)
Hemoglobin: 13.1 g/dL (ref 12.0–15.0)
MCH: 28.2 pg (ref 26.0–34.0)
MCHC: 33.9 g/dL (ref 30.0–36.0)
MCV: 83.2 fL (ref 78.0–100.0)
Platelets: 300 10*3/uL (ref 150–400)
RBC: 4.64 MIL/uL (ref 3.87–5.11)
RDW: 14.2 % (ref 11.5–15.5)
WBC: 6.8 10*3/uL (ref 4.0–10.5)

## 2015-02-27 MED ORDER — SACUBITRIL-VALSARTAN 24-26 MG PO TABS: 1.0000 | ORAL_TABLET | Freq: Two times a day (BID) | ORAL | Status: DC

## 2015-02-27 MED ORDER — CARVEDILOL 12.5 MG PO TABS: 12.5000 mg | ORAL_TABLET | Freq: Two times a day (BID) | ORAL | Status: DC

## 2015-02-27 MED ORDER — FUROSEMIDE 20 MG PO TABS
20.0000 mg | ORAL_TABLET | Freq: Every day | ORAL | Status: DC
Start: 2015-02-27 — End: 2015-04-11

## 2015-02-27 MED ORDER — FUROSEMIDE 20 MG PO TABS
20.0000 mg | ORAL_TABLET | Freq: Every day | ORAL | Status: DC
  Administered 2015-02-27: 20 mg via ORAL
  Filled 2015-02-27: qty 1

## 2015-02-27 MED FILL — Heparin Sodium (Porcine) 2 Unit/ML in Sodium Chloride 0.9%: INTRAMUSCULAR | Qty: 1500 | Status: AC

## 2015-02-27 NOTE — Progress Notes (Signed)
Nutrition Education Note  RD consulted for nutrition education regarding new onset CHF.  RD provided and discussed "Heart Failure Nutrition Therapy" handout from the Academy of [employer]. Reviewed patient's dietary recall. Provided examples on ways to decrease sodium intake in diet. Discouraged intake of processed foods and use of salt shaker. Encouraged fresh fruits and vegetables as well as whole grain sources of carbohydrates to maximize fiber intake. Discussed recommendation for 2000 mg of sodium per day and reviewed sodium guidelines per serving. RD answered pt's questions.   RD discussed why it is important for patient to adhere to diet recommendations, and emphasized the role of fluids, foods to avoid, and importance of weighing self daily. Teach back method used.  Expect good compliance. Pt states she was already avoiding salt and making an effort to eat more fresh vegetables PTA. She does not foresee any challenges in following a low sodium diet.   Body mass index is 25.8 kg/(m^2). Pt meets criteria for Overweight based on current BMI.  Current diet order is 2 Gram Sodium, patient is consuming approximately 100% of meals at this time. Labs and medications reviewed. No further nutrition interventions warranted at this time. RD contact information provided. If additional nutrition issues arise, please re-consult RD.   Pryor Ochoa RD, LDN Inpatient Clinical Dietitian Pager: [phone] After Hours Pager: [phone]

## 2015-02-27 NOTE — Progress Notes (Signed)
Patient discharging home on [place];   Benefit check in progress to determine the patient's co pay for medication. 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 Patient to pick up her medication/ [name] at [facility]; CM contacted [facility] and they are aware of medication/ Entresto to be picked up today by the patient.

## 2015-02-27 NOTE — Discharge Instructions (Signed)
**  PLEASE REMEMBER TO BRING ALL OF YOUR MEDICATIONS TO EACH OF YOUR FOLLOW-UP OFFICE VISITS. ° °NO HEAVY LIFTING OR SEXUAL ACTIVITY X 7 DAYS. °NO DRIVING X 2-3 DAYS. °NO SOAKING BATHS, HOT TUBS, POOLS, ETC., X 7 DAYS. ° °Groin Site Care °Refer to this sheet in the next few weeks. These instructions provide you with information on caring for yourself after your procedure. Your caregiver may also give you more specific instructions. Your treatment has been planned according to current medical practices, but problems sometimes occur. Call your caregiver if you have any problems or questions after your procedure. °HOME CARE INSTRUCTIONS °· You may shower 24 hours after the procedure. Remove the bandage (dressing) and gently wash the site with plain soap and water. Gently pat the site dry.  °· Do not apply powder or lotion to the site.  °· Do not sit in a bathtub, swimming pool, or whirlpool for 5 to 7 days.  °· No bending, squatting, or lifting anything over 10 pounds (4.5 kg) as directed by your caregiver.  °· Inspect the site at least twice daily.  °· Do not drive home if you are discharged the same day of the procedure. Have someone else drive you.  ° °What to expect: °· Any bruising will usually fade within 1 to 2 weeks.  °· Blood that collects in the tissue (hematoma) may be painful to the touch. It should usually decrease in size and tenderness within 1 to 2 weeks.  °SEEK IMMEDIATE MEDICAL CARE IF: °· You have unusual pain at the groin site or down the affected leg.  °· You have redness, warmth, swelling, or pain at the groin site.  °· You have drainage (other than a small amount of blood on the dressing).  °· You have chills.  °· You have a fever or persistent symptoms for more than 72 hours.  °· You have a fever and your symptoms suddenly get worse.  °· Your leg becomes pale, cool, tingly, or numb.  °You have heavy bleeding from the site. Hold pressure on the site. . ° °

## 2015-02-27 NOTE — Discharge Summary (Signed)
Discharge Summary   Patient ID: Mary Pratt,  MRN: [id number], DOB/AGE: [birth year] 46 y.o.  Admit date: 02/22/2015 Discharge date: 02/27/2015  Primary Care Provider: Gar Ponto Primary Cardiologist: Court Joy, MD   Discharge Diagnoses Principal Problem:   Acute systolic heart failure  **EF 20% by echo 02/22/2015.  **Discharge weight of 136 lbs (minus 14 lbs diuresis).  Active Problems:   Nonischemic cardiomyopathy  **Normal coronary arteries on catheterization this admission.   GERD with stricture   Essential hypertension   Allergies Allergies  Allergen Reactions  . Amoxicillin     REACTION: Tacycardia  . Clarithromycin     Procedures  Cardiac Catheterization 6.21.2016  Conclusion      There is severe left ventricular systolic dysfunction.  Very mild elevation of right sided heart pressures  Central aortic oxygen saturation 98%; pulmonary artery oxygen saturation 65%.   Dilated severe nonischemic cardiomyopathy with an ejection fraction of 15%.  Normal coronary arteries.  Mild pulmonary hypertension.  Fick Cardiac Output 4.09 L/min   Fick Cardiac Output Index 2.56 (L/min)/BSA   Thermal Cardiac Output 3.39 L/min   Thermal Cardiac Output Index 2.12 (L/min)/BSA   RA A Wave 6 mmHg   RA V Wave 3 mmHg   RA Mean 1 mmHg   RV Systolic Pressure 31 mmHg   RV Diastolic Pressure 4 mmHg   RV EDP 8 mmHg   PA Systolic Pressure 39 mmHg   PA Diastolic Pressure 13 mmHg   PA Mean 26 mmHg   PW A Wave 17 mmHg   PW V Wave 13 mmHg   PW Mean 12 mmHg   AO Systolic Pressure 518 mmHg   AO Diastolic Pressure 85 mmHg   AO Mean 98 mmHg   LV Systolic Pressure 841 mmHg   LV Diastolic Pressure 8 mmHg   LV EDP 18 mmHg _____________   History of Present Illness  44 y/o female without prior cardiac history.  She has a h/o GERD and was recently seen by GI and reported a one month history of DOE and lower extremity edema.  BNP was evaluated and found to be elevated and she was referred to our  office in [facility] for an echo, while a new patient appointment was arranged with Dr. Bronson Ing for later in the month.  She presented for her echo on 6/17 and this revealed severe LV dysfunction with an EF of 20%.  She was seen in clinic urgently and admitted for management of acute systolic CHF.  Hospital Course  Following admission, Mary Pratt was aggressively diuresed with brisk response and significant clinical improvement.  Her weight came down from 150 lbs on admission to a low of 133 lbs on 6/20, associated with a slight rise in her BUN/Creatinine.  Given her new cardiomyopathy diagnosis, it was felt that she would require an ischemic evaluation.  Initially, a cardiac CTA was planned, as it was felt that she was low risk for the presence of CAD/ICM.  In that setting, and with slight elevation in BUN/Creat, her lasix was held beginning on 6/20.  The CT could not be performed on 6/20 due to her HR remaining in the 80's to 90's despite titration of her coreg and additional IV metoprolol at the time of the procedure.  The CT was rescheduled for 6/21, however due to equipment failure, it could not be performed.  As a result, we opted to perform Right and Left Heart Cardiac Catheterization.  This revealed normal coronary arteries and relatively normal right  heart pressures with the exception of mild pulmonary hypertension.    Post-catheterization, Mary Pratt has been doing well.  She has been ambulating without recurrent dyspnea and her volume status is stable, though she is up 3 lbs since her lowest recorded weight on 6/20, at which point we felt that she was dry.  We have added back lasix 20 mg daily this morning.  She will be discharged today in good condition.  She has been treated with coreg and lisinopril throughout admission and we are working with case management this morning in an effort to place her on entresto (in the place of lisinopril).  She will begin entresto tomorrow evening and will  require a f/u BMET upon office f/u next week.  Of note, Mary Pratt has been enrolled in the VEST trial, which assesses pt volume/body water at discharge.  Her readings are blinded to the clinical team.  Discharge Vitals Blood pressure 132/93, pulse 92, temperature 98.2 F (36.8 C), temperature source Oral, resp. rate 18, height 5\' 1"  (1.549 m), weight 136 lb 8 oz (61.916 kg), last menstrual period 02/04/2015, SpO2 98 %.  Filed Weights   02/25/15 0620 02/26/15 0617 02/27/15 0554  Weight: 133 lb 9.6 oz (60.601 kg) 134 lb (60.782 kg) 136 lb 8 oz (61.916 kg)   Labs  CBC  Recent Labs  02/26/15 1821 02/27/15 0448  WBC 7.4 6.8  HGB 13.0 13.1  HCT 38.4 38.6  MCV 83.7 83.2  PLT 290 607   Basic Metabolic Panel  Recent Labs  02/26/15 0955 02/26/15 1821 02/27/15 0448  NA 136  --  137  K 4.6  --  4.5  CL 104  --  105  CO2 24  --  22  GLUCOSE 111*  --  102*  BUN 15  --  12  CREATININE 1.13* 1.20* 1.11*  CALCIUM 9.3  --  8.8*   Liver Function Tests Lab Results  Component Value Date   ALT 55* 02/22/2015   AST 46* 02/22/2015   ALKPHOS 75 02/22/2015   BILITOT 0.9 02/22/2015     Cardiac Enzymes Lab Results  Component Value Date   TROPONINI 0.03 02/23/2015     Thyroid Function Tests Lab Results  Component Value Date   TSH 1.663 02/22/2015    Disposition  Pt is being discharged home today in good condition.  Follow-up Plans & Appointments  Follow-up Information    Follow up with Herminio Commons, MD On 03/05/2015.   Specialty:  Cardiology   Why:  8:40 AM   Contact information:   [place] [address] [phone]       Follow up with Gar Ponto, MD.   Specialty:  Family Medicine   Why:  as scheduled.   Contact information:   [place] [address] [phone]       Follow up with [name], MD On 03/21/2015.   Specialty:  Cardiology   Why:  10:40 AM - [facility] [address] - [phone] information:   [place] [address] [phone]       Discharge Medications    Medication List    STOP taking these medications        loratadine 10 MG tablet  Commonly known as:  CLARITIN     potassium chloride SA 20 MEQ tablet  Commonly known as:  K-DUR,KLOR-CON      TAKE these medications        albuterol 108 (90 BASE) MCG/ACT inhaler  Commonly known as:  VENTOLIN HFA  Inhale 2 puffs into the lungs every 6 (six) hours as needed.     carvedilol 12.5 MG tablet  Commonly known as:  COREG  Take 1 tablet (12.5 mg total) by mouth 2 (two) times daily with a meal.     clonazePAM 0.5 MG tablet  Commonly known as:  KLONOPIN  Take 0.25 mg by mouth 2 (two) times daily as needed for anxiety.     DULoxetine 60 MG capsule  Commonly known as:  CYMBALTA  Take 60 mg by mouth daily.     fexofenadine 180 MG tablet  Commonly known as:  ALLEGRA  Take 180 mg by mouth daily.     Fluticasone-Salmeterol 250-50 MCG/DOSE Aepb  Commonly known as:  ADVAIR DISKUS  Inhale 1 puff into the lungs 2 (two) times daily.     furosemide 20 MG tablet  Commonly known as:  LASIX  Take 1 tablet (20 mg total) by mouth daily.     NON FORMULARY  ALLERGY INJECTIONS.  2 shots twice weekly     pantoprazole 40 MG tablet  Commonly known as:  PROTONIX  Take 40 mg by mouth 2 (two) times daily.     QNASL 80 MCG/ACT Aers  Generic drug:  Beclomethasone Dipropionate  Place 1 spray into the nose daily as needed.     sacubitril-valsartan 24-26 MG  Commonly known as:  ENTRESTO  Take 1 tablet by mouth 2 (two) times daily.  Start taking on:  02/28/2015        Outstanding Labs/Studies  Follow-up BMET at follow-up appointment.  Duration of Discharge Encounter   Greater than 30 minutes including physician time.  Signed, Murray Hodgkins NP 02/27/2015, 11:11 AM

## 2015-02-27 NOTE — Progress Notes (Signed)
I spoke again with Mary Pratt regarding her HF.  She denies any specific questions at this time regarding her new HF diagnosis.  She is very determined to make appropriate changes necessary for her health.  I have encouraged her to call me after discharge with any specific questions about HF.  She will follow-up next week with Dr. Jacinta Shoe on 03/05/15 and then with [facility] on 03/21/15.  She understands and is agreeable.

## 2015-02-27 NOTE — Research (Signed)
ReDS Vest Discharge Study  Results of ReDS reading  Your patient is in the Blinded arm of the Vest at Discharge study.  Your patient has had a ReDS Vest reading and the reading has been transmitted to the cloud.  Your patient is ok for discharge.    Thank You   The research team    

## 2015-02-27 NOTE — Progress Notes (Signed)
Patient Name: Mary Pratt Date of Encounter: 02/27/2015    Principal Problem:   Acute systolic heart failure Active Problems:   Nonischemic cardiomyopathy   GERD with stricture   Essential hypertension    SUBJECTIVE  Feeling well today. A little fatigued but no dyspnea or chest pain. She has already started measuring her sodium and fluid intake and is asking good questions about lifestyle management at home. She feels like she feels well enough for DC today.   CURRENT MEDS . aspirin EC  81 mg Oral Daily  . carvedilol  12.5 mg Oral BID WC  . furosemide  20 mg Oral Daily  . heparin  5,000 Units Subcutaneous 3 times per day  . lisinopril  2.5 mg Oral Daily  . pantoprazole  40 mg Oral BID  . sodium chloride  3 mL Intravenous Q12H  . sodium chloride  3 mL Intravenous Q12H    OBJECTIVE  Filed Vitals:   02/26/15 2031 02/27/15 0212 02/27/15 0330 02/27/15 0554  BP: 122/86 142/109 122/85 132/93  Pulse: 96 100 101 92  Temp: 99.6 F (37.6 C) 98.5 F (36.9 C)  98.2 F (36.8 C)  TempSrc: Oral Oral  Oral  Resp: 18 17  18   Height:      Weight:    136 lb 8 oz (61.916 kg)  SpO2: 95% 100%  98%    Intake/Output Summary (Last 24 hours) at 02/27/15 0723 Last data filed at 02/27/15 0600  Gross per 24 hour  Intake    480 ml  Output   1400 ml  Net   -920 ml   Filed Weights   02/25/15 0620 02/26/15 0617 02/27/15 0554  Weight: 133 lb 9.6 oz (60.601 kg) 134 lb (60.782 kg) 136 lb 8 oz (61.916 kg)    PHYSICAL EXAM  General: Pleasant, NAD. Neuro: Alert and oriented X 3. Moves all extremities spontaneously. Psych: Normal affect. HEENT: Normal  Neck: Supple without bruits or JVD. Lungs:  Resp regular and unlabored, CTA. Heart: RRR no s3, s4, or murmurs. Abdomen: Soft, non-tender, non-distended, BS + x 4.  Extremities: No clubbing or cyanosis. Trace pedal edema. DP/PT/Radials 2+ and equal bilaterally.  Accessory Clinical Findings  CBC  Recent Labs  02/26/15 1821  02/27/15 0448  WBC 7.4 6.8  HGB 13.0 13.1  HCT 38.4 38.6  MCV 83.7 83.2  PLT 290 102   Basic Metabolic Panel  Recent Labs  02/26/15 0955 02/26/15 1821 02/27/15 0448  NA 136  --  137  K 4.6  --  4.5  CL 104  --  105  CO2 24  --  22  GLUCOSE 111*  --  102*  BUN 15  --  12  CREATININE 1.13* 1.20* 1.11*  CALCIUM 9.3  --  8.8*   TELE  SR-ST with IVCD  Radiology/Studies  Dg Chest 2 View  02/18/2015   CLINICAL DATA:  Shortness of breath.  Cough.  EXAM: CHEST  2 VIEW  COMPARISON:  None.  FINDINGS: Cardiomegaly with pulmonary vascular prominence and diffuse mild interstitial prominence with small pleural effusions noted. No pneumothorax. No acute osseous abnormality.  IMPRESSION: Findings consistent with congestive heart failure mild interstitial edema and small pleural effusions.   Electronically Signed   By: Marcello Moores  Register   On: 02/18/2015 09:33    ASSESSMENT AND PLAN  1. Acute systolic CHF secondary to nonischemic cardiomyopathy: - R&LHC yesterday showing severe LV systolic dysfunction with normal coronary arteries. Mild elevation of R heart pressures  and mild pulmonary HTN.  - Weight is up 1 kg from yesterday but euvolemic on exam.  - Transition to Lasix 20 mg daily. Discussed daily weights. She knows to call if wt changes by more than 1 lb per day or 5 lbs in a week.  - Continue Coreg 12.5 mg BID.  - She has enough BP for Entresto. Will discuss with Dr. Radford Pax. Will hold lisinopril this AM so that we'd be able to start entresto tomorrow night - 24/26 BID. - Consider spironolactone at follow up if creat/K/BP stable. - Follow up with CHF clinic w/in 7 days.   2. HTN: - Much improved on coreg/acei. Considering entresto as above.   3. GERD: - Continue PPI  4. Asthma: - Continue Advair  Murray Hodgkins, NP 02/27/2015, 8:52 AM

## 2015-02-28 ENCOUNTER — Encounter: Payer: Self-pay | Admitting: Obstetrics and Gynecology

## 2015-02-28 ENCOUNTER — Encounter: Payer: Self-pay | Admitting: Cardiovascular Disease

## 2015-02-28 NOTE — Progress Notes (Signed)
RE: Benefit check      [place]           02/27/2015 Called [employer] at [phone]. Talked to [place] is covered. PRIOR AUTHORIZATION is required. Prior [employer] is [phone]. Patient will have a $50.00 retail pharmacy co-payment.

## 2015-03-01 ENCOUNTER — Telehealth: Payer: Self-pay

## 2015-03-01 NOTE — Telephone Encounter (Signed)
Prior auth for [place] 24-26 sent to [facility] via Cover My Meds

## 2015-03-04 ENCOUNTER — Telehealth (HOSPITAL_COMMUNITY): Payer: Self-pay | Admitting: Surgery

## 2015-03-04 NOTE — Telephone Encounter (Signed)
HF Nurse [place] Discharge Telephone Call  I spoke with Ms. Dougher regarding her recent hospitalization and new HF Diagnosis.  She tells me that she has been weighing daily and her weight today was 130lbs.  Her weight on discharge 02/27/15--was 136.5 lbs.  She denies any issue with getting or taking prescribed mediations.  She has been eating low sodium "the best she can".  She has an appt tomorrow with Dr. Bronson Ing in [place].  She denies any other questions at this time.  I have asked her to call me with any concerns or questions related to her HF diagnosis.

## 2015-03-05 ENCOUNTER — Ambulatory Visit: Payer: BLUE CROSS/BLUE SHIELD | Admitting: Cardiovascular Disease

## 2015-03-08 ENCOUNTER — Encounter: Payer: Self-pay | Admitting: Cardiovascular Disease

## 2015-03-08 ENCOUNTER — Ambulatory Visit (INDEPENDENT_AMBULATORY_CARE_PROVIDER_SITE_OTHER): Payer: BLUE CROSS/BLUE SHIELD | Admitting: Cardiovascular Disease

## 2015-03-08 VITALS — BP 100/82 | HR 94 | Ht 61.0 in | Wt 136.0 lb

## 2015-03-08 DIAGNOSIS — Z9289 Personal history of other medical treatment: Secondary | ICD-10-CM

## 2015-03-08 DIAGNOSIS — I5022 Chronic systolic (congestive) heart failure: Secondary | ICD-10-CM | POA: Diagnosis not present

## 2015-03-08 DIAGNOSIS — Z87898 Personal history of other specified conditions: Secondary | ICD-10-CM | POA: Diagnosis not present

## 2015-03-08 DIAGNOSIS — I447 Left bundle-branch block, unspecified: Secondary | ICD-10-CM | POA: Diagnosis not present

## 2015-03-08 DIAGNOSIS — I429 Cardiomyopathy, unspecified: Secondary | ICD-10-CM | POA: Diagnosis not present

## 2015-03-08 DIAGNOSIS — I428 Other cardiomyopathies: Secondary | ICD-10-CM

## 2015-03-08 NOTE — Progress Notes (Signed)
Patient ID: Mary Pratt, female   DOB: [birth year], 44 y.o.   MRN: [id number]      SUBJECTIVE: The patient is a 44 year old woman who I am meeting for the first time. She was recently hospitalized for acute systolic heart failure and a newly diagnosed nonischemic cardiomyopathy, EF 20%. Coronary angiography was normal. Right heart catheterization revealed mild pulmonary hypertension. She was discharged on carvedilol, Lasix, and Entresto. She diuresed from a weight of 150 pounds to 136 pounds on the day of discharge. TSH 1.6 on 6/17. BNP 2024 on 6/17.  One of her sons was shot and killed 19 months ago and she says she has never been the same since. She lives with her 43 year old son.   She currently denies chest pain, palpitations, leg swelling, orthopnea, paroxysmal nocturnal dyspnea, as well as lightheadedness, dizziness, and syncope. Her only complaint is sinus headaches. If she stands on her feet for a long time she has to sit down.  ECG on 02/27/15 showed sinus rhythm with a left bundle branch block, QRS duration 134 ms.  Soc: Nonsmoker Works at [employer] One of her sons was shot and killed 19 months ago and she says she has never been the same since. She lives with her 38 year old son.    Review of Systems: As per "subjective", otherwise negative.  Allergies  Allergen Reactions  . Amoxicillin     REACTION: Tacycardia  . Clarithromycin     Current Outpatient Prescriptions  Medication Sig Dispense Refill  . albuterol (VENTOLIN HFA) 108 (90 BASE) MCG/ACT inhaler Inhale 2 puffs into the lungs every 6 (six) hours as needed. 3 Inhaler 4  . carvedilol (COREG) 12.5 MG tablet Take 1 tablet (12.5 mg total) by mouth 2 (two) times daily with a meal. 60 tablet 6  . clonazePAM (KLONOPIN) 0.5 MG tablet Take 0.25 mg by mouth 2 (two) times daily as needed for anxiety.    . DULoxetine (CYMBALTA) 60 MG capsule Take 60 mg by mouth daily.    . fexofenadine (ALLEGRA) 180 MG tablet Take 180 mg by mouth  daily.    . Fluticasone-Salmeterol (ADVAIR DISKUS) 250-50 MCG/DOSE AEPB Inhale 1 puff into the lungs 2 (two) times daily. 180 each 4  . furosemide (LASIX) 20 MG tablet Take 1 tablet (20 mg total) by mouth daily. 30 tablet 6  . NON FORMULARY ALLERGY INJECTIONS.  2 shots twice weekly    . pantoprazole (PROTONIX) 40 MG tablet Take 40 mg by mouth 2 (two) times daily.     [name] 80 MCG/ACT AERS Place 1 spray into the nose daily as needed.     . sacubitril-valsartan (ENTRESTO) 24-26 MG Take 1 tablet by mouth 2 (two) times daily. 60 tablet 6   No current facility-administered medications for this visit.    Past Medical History  Diagnosis Date  . GERD with stricture     dilated 2010  . Asthma   . Functional dyspepsia     early satiety   . 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 Sickle cell trait   . HELICOBACTER PYLORI GASTRITIS 01/15/2009    dyspnea, ? reaction to Biaxin/amoxicillin  Pylera 01/15/09 - completed therapy     . Anxiety   . Chronic systolic CHF (congestive heart failure)     a. 02/2015 Echo: EF 20%, sev dil w/ diff HK, worse @ inf base. Tiv AI, mild MR, mod dil LA, PASP 23mmHg.  . Situational depression     "  son died"  . NICM (nonischemic cardiomyopathy)     a. 02/2015 Cath: nl cors, EF 15%, mild PAH;  b. 02/2015 Enrolled in VEST trial.    Past Surgical History  Procedure Laterality Date  . Wisdom tooth extraction    . Esophagogastroduodenoscopy (egd) with esophageal dilation  11/30/2008    esophageal ring dilated 38 French, H. pylori gastritis  . Colposcopy  2013    neg  . Tubal ligation  1991  . Cardiac catheterization N/A 02/26/2015    Procedure: Right/Left Heart Cath and Coronary Angiography;  Surgeon: Troy Sine, MD;  Location: [facility];  Service: Cardiovascular;  Laterality: N/A;    History   Social History  . Marital Status: Divorced    Spouse Name: N/A  . Number of Children: 3  . Years of Education: N/A   Occupational History  .  texturing operator    Social History Main Topics  . Smoking status: Never Smoker   . Smokeless tobacco: Never Used  . Alcohol Use: No  . Drug Use: No  . Sexual Activity:    Partners: Male    Birth Control/ Protection: Surgical     Comment: BTL   Other Topics Concern  . Not on file   Social History Narrative   Daily caffeine use - <1   2 sons 1 daughter 1 son deceased   Single        Filed Vitals:   03/08/15 1032  BP: 100/82  Pulse: 94  Height: 5\' 1"  (1.549 m)  Weight: 136 lb (61.689 kg)  SpO2: 94%    PHYSICAL EXAM General: NAD HEENT: Normal. Neck: No JVD, no thyromegaly. Lungs: Clear to auscultation bilaterally with normal respiratory effort. CV: Nondisplaced PMI.  Regular rate and rhythm, normal S1/split S2, no S3/S4, no murmur. No pretibial or periankle edema.  No carotid bruit.  Normal pedal pulses.  Abdomen: Soft, nontender, no hepatosplenomegaly, no distention.  Neurologic: Alert and oriented x 3.  Psych: Normal affect. Skin: Normal. Musculoskeletal: Normal range of motion, no gross deformities. Extremities: No clubbing or cyanosis.   ECG: Most recent ECG reviewed.      ASSESSMENT AND PLAN: 1. Nonischemic cardiomyopathy/chronic systolic heart failure: Euvolemic on current diuretic regimen which includes Lasix 20 mg daily. Will continue carvedilol and Entresto, with titration of the latter in ensuing weeks. Will need a repeat echo in several months to see if she is an ICD candidate. Will make cardiac rehabilitation referral.  Dispo: f/u 3 months.  Time spent: 40 minutes, of which greater than 50% was spent reviewing symptoms, relevant blood tests and studies, and discussing management plan with the patient.   Kate Sable, M.D., F.A.C.C.

## 2015-03-08 NOTE — Patient Instructions (Signed)
   Cardiac Rehab referral Continue all current medications. Follow up in  3 months

## 2015-03-20 ENCOUNTER — Telehealth: Payer: Self-pay

## 2015-03-20 NOTE — Telephone Encounter (Signed)
Entresto denied by [employer]. [place]). Will speak with Hurleen about appealing.

## 2015-03-21 ENCOUNTER — Ambulatory Visit (HOSPITAL_COMMUNITY)
Admit: 2015-03-21 | Discharge: 2015-03-21 | Disposition: A | Discharge status: Home / Self Care | Payer: BLUE CROSS/BLUE SHIELD | Source: Ambulatory Visit | Attending: Cardiology | Admitting: Cardiology

## 2015-03-21 ENCOUNTER — Encounter (HOSPITAL_COMMUNITY): Payer: Self-pay

## 2015-03-21 VITALS — BP 110/62 | HR 80 | Wt 137.5 lb

## 2015-03-21 DIAGNOSIS — I5021 Acute systolic (congestive) heart failure: Secondary | ICD-10-CM

## 2015-03-21 DIAGNOSIS — I420 Dilated cardiomyopathy: Secondary | ICD-10-CM | POA: Diagnosis not present

## 2015-03-21 DIAGNOSIS — I429 Cardiomyopathy, unspecified: Secondary | ICD-10-CM | POA: Diagnosis not present

## 2015-03-21 DIAGNOSIS — Z79899 Other long term (current) drug therapy: Secondary | ICD-10-CM | POA: Diagnosis not present

## 2015-03-21 DIAGNOSIS — I5022 Chronic systolic (congestive) heart failure: Secondary | ICD-10-CM | POA: Diagnosis not present

## 2015-03-21 DIAGNOSIS — I428 Other cardiomyopathies: Secondary | ICD-10-CM

## 2015-03-21 DIAGNOSIS — D573 Sickle-cell trait: Secondary | ICD-10-CM | POA: Diagnosis not present

## 2015-03-21 DIAGNOSIS — K219 Gastro-esophageal reflux disease without esophagitis: Secondary | ICD-10-CM | POA: Diagnosis not present

## 2015-03-21 DIAGNOSIS — J45909 Unspecified asthma, uncomplicated: Secondary | ICD-10-CM | POA: Diagnosis not present

## 2015-03-21 LAB — BASIC METABOLIC PANEL
Anion gap: 7 (ref 5–15)
BUN: 11 mg/dL (ref 6–20)
CO2: 25 mmol/L (ref 22–32)
Calcium: 9.1 mg/dL (ref 8.9–10.3)
Chloride: 105 mmol/L (ref 101–111)
Creatinine, Ser: 1.13 mg/dL — ABNORMAL HIGH (ref 0.44–1.00)
GFR calc Af Amer: >60 mL/min (ref >60)
GFR calc non Af Amer: 58 mL/min — ABNORMAL LOW (ref >60)
Glucose, Bld: 91 mg/dL (ref 65–99)
Potassium: 3.8 mmol/L (ref 3.5–5.1)
Sodium: 137 mmol/L (ref 135–145)

## 2015-03-21 LAB — BRAIN NATRIURETIC PEPTIDE: B Natriuretic Peptide: 352.9 pg/mL — ABNORMAL HIGH (ref 0.0–100.0)

## 2015-03-21 MED ORDER — SPIRONOLACTONE 25 MG PO TABS: 12.5000 mg | ORAL_TABLET | Freq: Every day | ORAL | Status: DC

## 2015-03-21 MED ORDER — SPIRONOLACTONE 25 MG PO TABS
12.5000 mg | ORAL_TABLET | Freq: Every day | ORAL | Status: DC
Start: 2015-03-21 — End: 2015-03-22

## 2015-03-21 NOTE — Progress Notes (Signed)
Patient ID: Mary Pratt, female   DOB: May 27, 1971, 44 y.o.   MRN: [id number] PCP: Dr. Gar Ponto  44 yo presents for evaluation of nonischemic dilated cardiomyopathy.  She developed exertional dyspnea and orthopnea in 5/16.  She had had no prior known cardiac problems.  She was admitted in 6/16 with CHF.  Echo showed EF 20% with severe LV dilation.  Coronary angiography showed normal coronaries.  RHC showed preserved cardiac output.  She was diuresed and started on cardiac meds.    Today, she seems to be doing fairly well.  She can walk about 1/2 mile then will tire and have to stop.  This is considerably better than in 5/16.  No orthopnea/PND.  No lightheadedness.  No chest pain.  No palpitations.  She does not drink ETOH and has not used drugs.  No family history of cardiomyopathy.  She has been under a lot of stress since last summer when one of her sons died.    ECG: NSR, biatrial enlargement, LBBB (134 msec)  Labs (6/16): K 4.5, creatinine 1.11  PMH: 1. GERD 2. Asthma 3. Sickle cell trait 4. H/o H pylori gastritis 5. Cardiomyopathy: Nonischemic.  Echo (6/16) with EF 20%, severe LV dilation, mild MR, PA systolic pressure 54 mmHg.  LHC/RHC (6/16) with EF 15%, CI 2.56 Fick/2.12 thermo, mean RA 1, PA 39/13 mean 26, mean PCWP 12, normal coronaries.    SH: Lives with son in [place].  Nonsmoker.  No ETOH or drugs. Works at [employer]  Divorced.   FH: No known heart disease.   ROS: All systems reviewed and negative except as per HPI.   Current Outpatient Prescriptions  Medication Sig Dispense Refill  . albuterol (VENTOLIN HFA) 108 (90 BASE) MCG/ACT inhaler Inhale 2 puffs into the lungs every 6 (six) hours as needed. 3 Inhaler 4  . carvedilol (COREG) 12.5 MG tablet Take 1 tablet (12.5 mg total) by mouth 2 (two) times daily with a meal. 60 tablet 6  . clonazePAM (KLONOPIN) 0.5 MG tablet Take 0.25 mg by mouth 2 (two) times daily as needed for anxiety.    . DULoxetine (CYMBALTA) 60 MG capsule Take  60 mg by mouth daily.    . fexofenadine (ALLEGRA) 180 MG tablet Take 180 mg by mouth daily.    . Fluticasone-Salmeterol (ADVAIR DISKUS) 250-50 MCG/DOSE AEPB Inhale 1 puff into the lungs 2 (two) times daily. 180 each 4  . furosemide (LASIX) 20 MG tablet Take 1 tablet (20 mg total) by mouth daily. 30 tablet 6  . NON FORMULARY ALLERGY INJECTIONS.  2 shots twice weekly    . pantoprazole (PROTONIX) 40 MG tablet Take 40 mg by mouth 2 (two) times daily.     [name] 80 MCG/ACT AERS Place 1 spray into the nose daily as needed.     . sacubitril-valsartan (ENTRESTO) 24-26 MG Take 1 tablet by mouth 2 (two) times daily. 60 tablet 6  . spironolactone (ALDACTONE) 25 MG tablet Take 0.5 tablets (12.5 mg total) by mouth daily. 15 tablet 6   No current facility-administered medications for this encounter.   BP 110/62 mmHg  Pulse 80  Wt 137 lb 8 oz (62.37 kg)  SpO2 98% General: NAD Neck: No JVD, no thyromegaly or thyroid nodule.  Lungs: Clear to auscultation bilaterally with normal respiratory effort. CV: Nondisplaced PMI.  Heart regular S1/S2, no S3/S4, no murmur.  No peripheral edema.  No carotid bruit.  Normal pedal pulses.  Abdomen: Soft, nontender, no hepatosplenomegaly, no distention.  Skin: Intact without lesions or rashes.  Neurologic: Alert and oriented x 3.  Psych: Normal affect. Extremities: No clubbing or cyanosis.  HEENT: Normal.   Assessment/Plan: Chronic systolic CHF: Nonischemic cardiomyopathy.  Etiology uncertain: normal coronaries, no history of ETOH or drug abuse, no family history of cardiomyopathy.  No prior history of HTN.  Possible that it is related to viral myocarditis.  NYHA class II symptoms, euvolemic on exam.  - Continue current Coreg and Entresto.  - Continue current Lasix, check BMET/BNP today.  - Add spironolactone 12.5 mg daily.  BMET with followup in 2-3 weeks.  - Send SPEP, ANA.   - She plans to start cardiac rehab at [facility], which I think is a good idea.   -  Cardiac MRI to assess for infiltrative disease or evidence for myocarditis.  - Repeat echo in 12/16 for ICD (borderline candidate for CRT with QRS 134 msec).   Followup in 2-3 wks for medication titration.   [name] 03/21/2015

## 2015-03-21 NOTE — Patient Instructions (Signed)
Routine lab work today. Will notify you of abnormal results, otherwise no news is good news!  START Spironolactone 12.5 mg (1/2 tablet) once daily.  [facility] Radiology Department will contact you to set up cardiac MRI.  Follow up 3 weeks.  Do the following things EVERYDAY: 1) Weigh yourself in the morning before breakfast. Write it down and keep it in a log. 2) Take your medicines as prescribed 3) Eat low salt foods-Limit salt (sodium) to 2000 mg per day.  4) Stay as active as you can everyday 5) Limit all fluids for the day to less than 2 liters

## 2015-03-22 ENCOUNTER — Telehealth (HOSPITAL_COMMUNITY): Payer: Self-pay | Admitting: *Deleted

## 2015-03-22 ENCOUNTER — Other Ambulatory Visit (HOSPITAL_COMMUNITY): Payer: Self-pay | Admitting: *Deleted

## 2015-03-22 LAB — PROTEIN ELECTROPHORESIS, SERUM
A/G Ratio: 1.1 (ref 0.7–1.7)
Albumin ELP: 2.5 g/dL — ABNORMAL LOW (ref 2.9–4.4)
Alpha-1-Globulin: 0.2 g/dL (ref 0.0–0.4)
Alpha-2-Globulin: 0.5 g/dL (ref 0.4–1.0)
Beta Globulin: 0.7 g/dL (ref 0.7–1.3)
Gamma Globulin: 0.9 g/dL (ref 0.4–1.8)
Globulin, Total: 2.3 g/dL (ref 2.2–3.9)
Total Protein ELP: 4.8 g/dL — ABNORMAL LOW (ref 6.0–8.5)

## 2015-03-22 MED ORDER — SPIRONOLACTONE 25 MG PO TABS: 12.5000 mg | ORAL_TABLET | Freq: Every day | ORAL | Status: DC

## 2015-03-22 NOTE — Telephone Encounter (Signed)
Mary Pratt with disability called requesting some information about the forms she faxed for pt. i informed her that Mary Pratt Pratt was out of the office until Monday and she handles our disability paperwork. She requests a callback from [facility])

## 2015-04-02 ENCOUNTER — Encounter (HOSPITAL_COMMUNITY)
Admission: RE | Admit: 2015-04-02 | Discharge: 2015-04-02 | Disposition: A | Discharge status: Home / Self Care | Payer: BLUE CROSS/BLUE SHIELD | Source: Ambulatory Visit | Attending: Cardiovascular Disease | Admitting: Cardiovascular Disease

## 2015-04-02 VITALS — BP 100/70 | HR 84 | Ht 61.0 in | Wt 136.7 lb

## 2015-04-02 DIAGNOSIS — I5023 Acute on chronic systolic (congestive) heart failure: Secondary | ICD-10-CM

## 2015-04-02 DIAGNOSIS — I5021 Acute systolic (congestive) heart failure: Secondary | ICD-10-CM | POA: Diagnosis not present

## 2015-04-02 DIAGNOSIS — I428 Other cardiomyopathies: Secondary | ICD-10-CM

## 2015-04-02 DIAGNOSIS — I429 Cardiomyopathy, unspecified: Secondary | ICD-10-CM | POA: Diagnosis not present

## 2015-04-02 NOTE — Progress Notes (Signed)
Cardiac/Pulmonary Rehab Medication Review by a Pharmacist  Does the patient  feel that his/her medications are working for him/her?  yes  Has the patient been experiencing any side effects to the medications prescribed?  yes  Does the patient measure his/her own blood pressure or blood glucose at home?  no   Does the patient have any problems obtaining medications due to transportation or finances?   yes  Understanding of regimen: excellent Understanding of indications: excellent Potential of compliance: excellent  Questions asked to Determine Patient Understanding of Medication Regimen:  1. What is the name of the medication?  2. What is the medication used for?  3. When should it be taken?  4. How much should be taken?  5. How will you take it?  6. What side effects should you report?  Understanding Defined as: Excellent: All questions above are correct Good: Questions 1-4 are correct Fair: Questions 1-2 are correct  Poor: 1 or none of the above questions are correct   Pharmacist comments: Patient states she is having some dizziness and feels tired at time, unclear if this is due to medication.  Pt may need to use mail order pharmacy in the future to be able to obtain medications due to insurance / cost.    Hart Robinsons A 04/02/2015 3:00 PM

## 2015-04-02 NOTE — Progress Notes (Signed)
Patient arrived for 1st visit/orientation/education at 2:30PM. Patient was referred to CR by Dr. Bronson Ing due to Acute Systolic CHF P95.09/TOIZTIWPYKDXIP I42.9. During orientation advised patient on arrival and appointment times what to wear, what to do before, during and after exercise. Reviewed attendance and class policy. Talked about inclement weather and class consultation policy. Pt is scheduled to return Cardiac Rehab on 04/03/15 at 11:00am. Pt was advised to come to class 5 minutes before class starts. He was also given instructions on meeting with the dietician and attending the Family Structure classes. Pt is eager to get started. Patient was able to complete 6 minute walk test. Patient was measured for the equipment. Discussed equipment safety with patient. Took patient pre-anthropometric measurements. Patient finished visit at 4:30pm.

## 2015-04-03 ENCOUNTER — Encounter (HOSPITAL_COMMUNITY)
Admission: RE | Admit: 2015-04-03 | Discharge: 2015-04-03 | Disposition: A | Discharge status: Home / Self Care | Payer: BLUE CROSS/BLUE SHIELD | Source: Ambulatory Visit | Attending: Cardiovascular Disease | Admitting: Cardiovascular Disease

## 2015-04-03 ENCOUNTER — Telehealth (HOSPITAL_COMMUNITY): Payer: Self-pay | Admitting: *Deleted

## 2015-04-03 DIAGNOSIS — I5021 Acute systolic (congestive) heart failure: Secondary | ICD-10-CM | POA: Diagnosis not present

## 2015-04-03 NOTE — Telephone Encounter (Signed)
Would like call back when papers are faxed to [facility].  Mary Pratt is aware and will do so.

## 2015-04-05 ENCOUNTER — Encounter (HOSPITAL_COMMUNITY)
Admission: RE | Admit: 2015-04-05 | Discharge: 2015-04-05 | Disposition: A | Discharge status: Home / Self Care | Payer: BLUE CROSS/BLUE SHIELD | Source: Ambulatory Visit | Attending: Cardiovascular Disease | Admitting: Cardiovascular Disease

## 2015-04-05 ENCOUNTER — Ambulatory Visit (HOSPITAL_COMMUNITY)
Admission: RE | Admit: 2015-04-05 | Discharge: 2015-04-05 | Disposition: A | Discharge status: Home / Self Care | Payer: BLUE CROSS/BLUE SHIELD | Source: Ambulatory Visit | Attending: Cardiology | Admitting: Cardiology

## 2015-04-05 ENCOUNTER — Telehealth (HOSPITAL_COMMUNITY): Payer: Self-pay | Admitting: Cardiology

## 2015-04-05 ENCOUNTER — Other Ambulatory Visit (HOSPITAL_COMMUNITY): Payer: BLUE CROSS/BLUE SHIELD

## 2015-04-05 DIAGNOSIS — I5021 Acute systolic (congestive) heart failure: Secondary | ICD-10-CM | POA: Insufficient documentation

## 2015-04-05 DIAGNOSIS — I429 Cardiomyopathy, unspecified: Secondary | ICD-10-CM | POA: Diagnosis not present

## 2015-04-05 MED ORDER — GADOBENATE DIMEGLUMINE 529 MG/ML IV SOLN
20.0000 mL | Freq: Once | INTRAVENOUS | Status: AC | PRN
  Administered 2015-04-05: 20 mL via INTRAVENOUS

## 2015-04-05 NOTE — Progress Notes (Signed)
Cardiac Rehabilitation Program Outcomes Report   Orientation:  04/02/15 Graduate Date:  tbd Discharge Date:  tbd # of sessions completed: 3  Cardiologist: Gaynelle Cage MD:  Bland Span Time:  1100  A.  Exercise Program:  Tolerates exercise @ 3.36 METS for 15 minutes and Walk Test Results:  Pre: 2.74 mets  B.  Mental Health:  Good mental attitude  C.  Education/Instruction/Skills  Accurately checks own pulse.  Rest:  97  Exercise:  113  Uses Perceived Exertion Scale and/or Dyspnea Scale  D.  Nutrition/Weight Control/Body Composition:  Adherence to prescribed nutrition program: fair    E.  Blood Lipids   No results found for: CHOL, HDL, LDLCALC, LDLDIRECT, TRIG, CHOLHDL  F.  Lifestyle Changes:  Making positive lifestyle changes  G.  Symptoms noted with exercise:  Asymptomatic  Report Completed By:  Stevphen Rochester RN   Comments:  This is the patients first week progress note for [facility].

## 2015-04-05 NOTE — Telephone Encounter (Signed)
Patient was at cardiac rehab and was advised to follow up on low b/p readings B/p today (84/60) -c/o dizziness denies: SOB, chest pain or cough -weight today 134 Coreg 12.5 BID, Lasix 20 daily, entresto 24/26 BID, Spiro 12.5 daily  Per vO Darrick Grinder, NP Patient should stop spiro and increase po intake today Follow up as scheduled   Pt aware and voiced understanding

## 2015-04-08 ENCOUNTER — Encounter (HOSPITAL_COMMUNITY)
Admission: RE | Admit: 2015-04-08 | Discharge: 2015-04-08 | Disposition: A | Discharge status: Home / Self Care | Payer: BLUE CROSS/BLUE SHIELD | Source: Ambulatory Visit | Attending: Cardiovascular Disease | Admitting: Cardiovascular Disease

## 2015-04-08 DIAGNOSIS — I5021 Acute systolic (congestive) heart failure: Secondary | ICD-10-CM | POA: Diagnosis not present

## 2015-04-08 DIAGNOSIS — I429 Cardiomyopathy, unspecified: Secondary | ICD-10-CM | POA: Diagnosis not present

## 2015-04-10 ENCOUNTER — Encounter (HOSPITAL_COMMUNITY)
Admission: RE | Admit: 2015-04-10 | Discharge: 2015-04-10 | Disposition: A | Discharge status: Home / Self Care | Payer: BLUE CROSS/BLUE SHIELD | Source: Ambulatory Visit | Attending: Cardiovascular Disease | Admitting: Cardiovascular Disease

## 2015-04-10 ENCOUNTER — Encounter (HOSPITAL_COMMUNITY): Payer: Self-pay

## 2015-04-10 DIAGNOSIS — I5021 Acute systolic (congestive) heart failure: Secondary | ICD-10-CM | POA: Diagnosis not present

## 2015-04-10 NOTE — Progress Notes (Signed)
Multiple forms from [place] completed on behalf of patient and faxed along with necessary medical records to [phone]. Claim # [id number]  Renee Pain

## 2015-04-11 ENCOUNTER — Ambulatory Visit (HOSPITAL_COMMUNITY)
Admission: RE | Admit: 2015-04-11 | Discharge: 2015-04-11 | Disposition: A | Discharge status: Home / Self Care | Payer: BLUE CROSS/BLUE SHIELD | Source: Ambulatory Visit | Attending: Internal Medicine | Admitting: Internal Medicine

## 2015-04-11 VITALS — BP 88/60 | HR 77 | Wt 138.0 lb

## 2015-04-11 DIAGNOSIS — I5021 Acute systolic (congestive) heart failure: Secondary | ICD-10-CM | POA: Diagnosis not present

## 2015-04-11 DIAGNOSIS — K219 Gastro-esophageal reflux disease without esophagitis: Secondary | ICD-10-CM | POA: Diagnosis not present

## 2015-04-11 DIAGNOSIS — J45909 Unspecified asthma, uncomplicated: Secondary | ICD-10-CM | POA: Insufficient documentation

## 2015-04-11 DIAGNOSIS — I5022 Chronic systolic (congestive) heart failure: Secondary | ICD-10-CM | POA: Diagnosis not present

## 2015-04-11 DIAGNOSIS — Z79899 Other long term (current) drug therapy: Secondary | ICD-10-CM | POA: Insufficient documentation

## 2015-04-11 DIAGNOSIS — I429 Cardiomyopathy, unspecified: Secondary | ICD-10-CM | POA: Insufficient documentation

## 2015-04-11 DIAGNOSIS — D573 Sickle-cell trait: Secondary | ICD-10-CM | POA: Insufficient documentation

## 2015-04-11 LAB — BASIC METABOLIC PANEL
Anion gap: 7 (ref 5–15)
BUN: 12 mg/dL (ref 6–20)
CO2: 26 mmol/L (ref 22–32)
Calcium: 9.4 mg/dL (ref 8.9–10.3)
Chloride: 106 mmol/L (ref 101–111)
Creatinine, Ser: 1.11 mg/dL — ABNORMAL HIGH (ref 0.44–1.00)
GFR calc Af Amer: >60 mL/min (ref >60)
GFR calc non Af Amer: 59 mL/min — ABNORMAL LOW (ref >60)
Glucose, Bld: 85 mg/dL (ref 65–99)
Potassium: 4.2 mmol/L (ref 3.5–5.1)
Sodium: 139 mmol/L (ref 135–145)

## 2015-04-11 MED ORDER — FUROSEMIDE 20 MG PO TABS: 20.0000 mg | ORAL_TABLET | ORAL | Status: DC

## 2015-04-11 NOTE — Progress Notes (Signed)
Patient ID: Mary Pratt, female   DOB: 18-Feb-1971, 44 y.o.   MRN: [id number]  PCP: Dr. Gar Ponto Primary HF: [name]  44 yo presents for evaluation of nonischemic dilated cardiomyopathy.  She developed exertional dyspnea and orthopnea in 5/16.  She had had no prior known cardiac problems.  She was admitted in 6/16 with CHF.  Echo showed EF 20% with severe LV dilation.  Coronary angiography showed normal coronaries.  RHC showed preserved cardiac output.  She was diuresed and started on cardiac meds.    Reports today for HF follow up.  Had cMRI 7/29.  EF improved from echo in June. At last visit we added spiro, but BP was too low so had to come off.  Has been doing ok since last visit. Can walk for about 25 minutes without running out of energy or SOB.  Can go to the grocery store and perform all ADLs without difficulty. Feels like she's dealing with her stress a little better. No orthopnea, bendopnea, or CP.  Has occasional tachypalpitations. Does have some lightheadedness from sitting to standing. Is worried about going back to work but says she needs the money.  Labs (6/16): K 4.5, creatinine 1.11 Labs (7/16): K 3.8, creatinine 1.13, BNP 353  PMH: 1. GERD 2. Asthma 3. Sickle cell trait 4. H/o H pylori gastritis 5. Cardiomyopathy: Nonischemic.  Echo (6/16) with EF 20%, severe LV dilation, mild MR, PA systolic pressure 54 mmHg.  LHC/RHC (6/16) with EF 15%, CI 2.56 Fick/2.12 thermo, mean RA 1, PA 39/13 mean 26, mean PCWP 12, normal coronaries.  cMRI (7/16) with oderately dilated LV with EF 31%, prominent LV septal-lateral dyssynchrony, normal RV size and systolic function, no myocardial LGE, so no definitive evidence for prior MI, Myocarditis, or infiltrative disease.  SH: Lives with son in [place].  Nonsmoker.  No ETOH or drugs. Works at [employer]  Divorced.   FH: No known heart disease.   ROS: All systems reviewed and negative except as per HPI.   Current Outpatient Prescriptions  Medication  Sig Dispense Refill  . albuterol (VENTOLIN HFA) 108 (90 BASE) MCG/ACT inhaler Inhale 2 puffs into the lungs every 6 (six) hours as needed. 3 Inhaler 4  . carvedilol (COREG) 12.5 MG tablet Take 1 tablet (12.5 mg total) by mouth 2 (two) times daily with a meal. 60 tablet 6  . clonazePAM (KLONOPIN) 0.5 MG tablet Take 0.5 mg by mouth 2 (two) times daily as needed for anxiety.     . DULoxetine (CYMBALTA) 60 MG capsule Take 60 mg by mouth daily.    . fexofenadine (ALLEGRA) 180 MG tablet Take 180 mg by mouth daily.    . Fluticasone-Salmeterol (ADVAIR DISKUS) 250-50 MCG/DOSE AEPB Inhale 1 puff into the lungs 2 (two) times daily. 180 each 4  . furosemide (LASIX) 20 MG tablet Take 1 tablet (20 mg total) by mouth daily. 30 tablet 6  . NON FORMULARY ALLERGY INJECTIONS.  2 shots every 2 weeks    . pantoprazole (PROTONIX) 40 MG tablet Take 40 mg by mouth 2 (two) times daily.     [name] 80 MCG/ACT AERS Place 1 spray into the nose daily as needed.     . sacubitril-valsartan (ENTRESTO) 24-26 MG Take 1 tablet by mouth 2 (two) times daily. 60 tablet 6   No current facility-administered medications for this encounter.   BP 88/60 mmHg  Pulse 77  Wt 138 lb (62.596 kg)  SpO2 96% General: NAD Neck: No JVD, no thyromegaly or  thyroid nodule.  Lungs: Clear to auscultation bilaterally with normal respiratory effort. CV: Nondisplaced PMI.  Heart regular S1/S2, no S3/S4, no murmur.  No peripheral edema.  No carotid bruit.  Normal pedal pulses.  Abdomen: Soft, nontender, no hepatosplenomegaly, no distention.  Skin: Intact without lesions or rashes.  Neurologic: Alert and oriented x 3.  Psych: Normal affect. Extremities: No clubbing or cyanosis.  HEENT: Normal.   Assessment/Plan: Chronic systolic CHF: Nonischemic cardiomyopathy.  Etiology uncertain: normal coronaries, no history of ETOH or drug abuse, no family history of cardiomyopathy.  No prior history of HTN.  Possible that it is related to viral myocarditis.   NYHA class II symptoms, euvolemic on exam. cMRI shows slight improvement of EF to 31%.  Also shows LV dyssynchrony. - Continue current Coreg and Entresto at current doses, no room for titration.  - Decrease Lasix to every other day. - Send SPEP, ANA.   - Repeat echo in 12/16 for ICD.  Borderline candidate for CRT with QRS 134 msec on last echo, but she has prominent septal-lateral dyssynchrony on imaging so may benefit.   Followup in 1 month.  Labs today.  Satira Mccallum Tillery PA-C 04/11/2015   Patient seen with PA, agree with the above note.  No BP room to titrate up on meds.  Think we can back off on Lasix to every other day.  Will need echo in 12/16 for ICD evaluation, may be CRT candidate.   [name] 04/13/2015

## 2015-04-11 NOTE — Patient Instructions (Addendum)
CHANGE Lasix to 20 mg, one tab every other day  Labs today  Your physician recommends that you schedule a follow-up appointment in: 1 months  Do the following things EVERYDAY: 1) Weigh yourself in the morning before breakfast. Write it down and keep it in a log. 2) Take your medicines as prescribed 3) Eat low salt foods-Limit salt (sodium) to 2000 mg per day.  4) Stay as active as you can everyday 5) Limit all fluids for the day to less than 2 liters 6)

## 2015-04-12 ENCOUNTER — Encounter (HOSPITAL_COMMUNITY)
Admission: RE | Admit: 2015-04-12 | Discharge: 2015-04-12 | Disposition: A | Discharge status: Home / Self Care | Payer: BLUE CROSS/BLUE SHIELD | Source: Ambulatory Visit | Attending: Cardiovascular Disease | Admitting: Cardiovascular Disease

## 2015-04-12 DIAGNOSIS — I5021 Acute systolic (congestive) heart failure: Secondary | ICD-10-CM | POA: Diagnosis not present

## 2015-04-15 ENCOUNTER — Encounter (HOSPITAL_COMMUNITY): Payer: BLUE CROSS/BLUE SHIELD

## 2015-04-17 ENCOUNTER — Encounter (HOSPITAL_COMMUNITY): Payer: BLUE CROSS/BLUE SHIELD

## 2015-04-17 DIAGNOSIS — I5022 Chronic systolic (congestive) heart failure: Secondary | ICD-10-CM

## 2015-04-17 DIAGNOSIS — I429 Cardiomyopathy, unspecified: Secondary | ICD-10-CM

## 2015-04-19 ENCOUNTER — Encounter (HOSPITAL_COMMUNITY): Payer: BLUE CROSS/BLUE SHIELD

## 2015-04-22 ENCOUNTER — Encounter (HOSPITAL_COMMUNITY)
Admission: RE | Admit: 2015-04-22 | Discharge: 2015-04-22 | Disposition: A | Discharge status: Home / Self Care | Payer: BLUE CROSS/BLUE SHIELD | Source: Ambulatory Visit | Attending: Cardiovascular Disease | Admitting: Cardiovascular Disease

## 2015-04-22 ENCOUNTER — Telehealth (HOSPITAL_COMMUNITY): Payer: Self-pay | Admitting: Cardiology

## 2015-04-22 DIAGNOSIS — I5021 Acute systolic (congestive) heart failure: Secondary | ICD-10-CM | POA: Diagnosis not present

## 2015-04-22 NOTE — Telephone Encounter (Signed)
Patient left voicemail requesting letter for work Patient request work excuse be extended until follow-up 05/09/2015 Please fax to [phone] Patient states our office has done a letter like this in the past

## 2015-04-23 ENCOUNTER — Encounter (HOSPITAL_COMMUNITY): Payer: Self-pay

## 2015-04-23 NOTE — Progress Notes (Signed)
Physician statement received to [place] from [place] and completed and signed by Dr. [name]. Completed form with necessary notes, diagnostic studies, results, medias, etc faxed to provided # [id number] Claim # [id number] Copy of form and request scanned into electronic medical records.  Renee Pain

## 2015-04-24 ENCOUNTER — Encounter (HOSPITAL_COMMUNITY)
Admission: RE | Admit: 2015-04-24 | Discharge: 2015-04-24 | Disposition: A | Discharge status: Home / Self Care | Payer: BLUE CROSS/BLUE SHIELD | Source: Ambulatory Visit | Attending: Cardiovascular Disease | Admitting: Cardiovascular Disease

## 2015-04-24 DIAGNOSIS — I5021 Acute systolic (congestive) heart failure: Secondary | ICD-10-CM | POA: Diagnosis not present

## 2015-04-25 ENCOUNTER — Encounter (HOSPITAL_COMMUNITY): Payer: Self-pay

## 2015-04-25 NOTE — Telephone Encounter (Signed)
Letter signed by Dr. [name] and faxed as requested.  Mary Pratt

## 2015-04-26 ENCOUNTER — Encounter (HOSPITAL_COMMUNITY): Payer: Self-pay | Admitting: *Deleted

## 2015-04-26 ENCOUNTER — Encounter (HOSPITAL_COMMUNITY): Payer: BLUE CROSS/BLUE SHIELD

## 2015-04-26 NOTE — Progress Notes (Signed)
Patient was given individual home exercise plan. Handouts were reviewed and discussed. Patient verbalized an understanding.

## 2015-04-29 ENCOUNTER — Encounter (HOSPITAL_COMMUNITY)
Admission: RE | Admit: 2015-04-29 | Discharge: 2015-04-29 | Disposition: A | Discharge status: Home / Self Care | Payer: BLUE CROSS/BLUE SHIELD | Source: Ambulatory Visit | Attending: Cardiovascular Disease | Admitting: Cardiovascular Disease

## 2015-04-29 DIAGNOSIS — I5021 Acute systolic (congestive) heart failure: Secondary | ICD-10-CM | POA: Diagnosis not present

## 2015-04-30 ENCOUNTER — Ambulatory Visit: Payer: BLUE CROSS/BLUE SHIELD | Admitting: Internal Medicine

## 2015-05-01 ENCOUNTER — Encounter (HOSPITAL_COMMUNITY)
Admission: RE | Admit: 2015-05-01 | Discharge: 2015-05-01 | Disposition: A | Discharge status: Home / Self Care | Payer: BLUE CROSS/BLUE SHIELD | Source: Ambulatory Visit | Attending: Cardiovascular Disease | Admitting: Cardiovascular Disease

## 2015-05-01 DIAGNOSIS — I5021 Acute systolic (congestive) heart failure: Secondary | ICD-10-CM | POA: Diagnosis not present

## 2015-05-03 ENCOUNTER — Encounter (HOSPITAL_COMMUNITY)
Admission: RE | Admit: 2015-05-03 | Discharge: 2015-05-03 | Disposition: A | Discharge status: Home / Self Care | Payer: BLUE CROSS/BLUE SHIELD | Source: Ambulatory Visit | Attending: Cardiovascular Disease | Admitting: Cardiovascular Disease

## 2015-05-03 DIAGNOSIS — I5021 Acute systolic (congestive) heart failure: Secondary | ICD-10-CM | POA: Diagnosis not present

## 2015-05-06 ENCOUNTER — Encounter (HOSPITAL_COMMUNITY)
Admission: RE | Admit: 2015-05-06 | Discharge: 2015-05-06 | Disposition: A | Discharge status: Home / Self Care | Payer: BLUE CROSS/BLUE SHIELD | Source: Ambulatory Visit | Attending: Cardiovascular Disease | Admitting: Cardiovascular Disease

## 2015-05-06 DIAGNOSIS — I5021 Acute systolic (congestive) heart failure: Secondary | ICD-10-CM | POA: Diagnosis not present

## 2015-05-08 ENCOUNTER — Encounter (HOSPITAL_COMMUNITY)
Admission: RE | Admit: 2015-05-08 | Discharge: 2015-05-08 | Disposition: A | Discharge status: Home / Self Care | Payer: BLUE CROSS/BLUE SHIELD | Source: Ambulatory Visit | Attending: Cardiovascular Disease | Admitting: Cardiovascular Disease

## 2015-05-08 DIAGNOSIS — I5021 Acute systolic (congestive) heart failure: Secondary | ICD-10-CM | POA: Diagnosis not present

## 2015-05-09 ENCOUNTER — Encounter (HOSPITAL_COMMUNITY): Payer: Self-pay | Admitting: *Deleted

## 2015-05-09 ENCOUNTER — Ambulatory Visit (HOSPITAL_COMMUNITY)
Admission: RE | Admit: 2015-05-09 | Discharge: 2015-05-09 | Disposition: A | Discharge status: Home / Self Care | Payer: BLUE CROSS/BLUE SHIELD | Source: Ambulatory Visit | Attending: Internal Medicine | Admitting: Internal Medicine

## 2015-05-09 VITALS — BP 96/58 | HR 70 | Wt 137.8 lb

## 2015-05-09 DIAGNOSIS — K219 Gastro-esophageal reflux disease without esophagitis: Secondary | ICD-10-CM | POA: Diagnosis not present

## 2015-05-09 DIAGNOSIS — I5022 Chronic systolic (congestive) heart failure: Secondary | ICD-10-CM | POA: Diagnosis not present

## 2015-05-09 DIAGNOSIS — D573 Sickle-cell trait: Secondary | ICD-10-CM | POA: Insufficient documentation

## 2015-05-09 DIAGNOSIS — I428 Other cardiomyopathies: Secondary | ICD-10-CM | POA: Insufficient documentation

## 2015-05-09 DIAGNOSIS — I429 Cardiomyopathy, unspecified: Secondary | ICD-10-CM

## 2015-05-09 DIAGNOSIS — R5383 Other fatigue: Secondary | ICD-10-CM | POA: Diagnosis not present

## 2015-05-09 DIAGNOSIS — Z79899 Other long term (current) drug therapy: Secondary | ICD-10-CM | POA: Insufficient documentation

## 2015-05-09 DIAGNOSIS — J45909 Unspecified asthma, uncomplicated: Secondary | ICD-10-CM | POA: Insufficient documentation

## 2015-05-09 MED ORDER — DIGOXIN 125 MCG PO TABS: 0.1250 mg | ORAL_TABLET | Freq: Every day | ORAL | Status: DC

## 2015-05-09 NOTE — Progress Notes (Signed)
Patient ID: Mary Pratt, female   DOB: Oct 17, 1970, 44 y.o.   MRN: [id number]  PCP: Dr. Gar Ponto Primary HF: Mary Pratt  44 yo presents for evaluation of nonischemic dilated cardiomyopathy.  She developed exertional dyspnea and orthopnea in 5/16.  She had had no prior known cardiac problems.  She was admitted in 6/16 with CHF.  Echo showed EF 20% with severe LV dilation.  Coronary angiography showed normal coronaries.  RHC showed preserved cardiac output.  She was diuresed and started on cardiac meds.    Reports today for HF follow up.  Had cMRI 7/29.  EF improved from echo in 6/16. Got more dizzy when spironolactone was added so stopped it.  She fatigues easily (walking ups stairs or hills).  Not having much dyspnea. No orthopnea, bendopnea, or CP.  No palpitations.   Still has some lightheadedness from sitting to standing. Weight is down 1 lb.  SBP dropped by about 10 mmHg lying to standing today, so does not meet criteria for orthostatic hypotension.    Labs (6/16): K 4.5, creatinine 1.11, TSH normal Labs (7/16): K 3.8, creatinine 1.13, BNP 353, SPEP negative Labs (8/16): K 4.2, creatinine 1.11  PMH: 1. GERD 2. Asthma 3. Sickle cell trait 4. H/o H pylori gastritis 5. Cardiomyopathy: Nonischemic.  Echo (6/16) with EF 20%, severe LV dilation, mild MR, PA systolic pressure 54 mmHg.  LHC/RHC (6/16) with EF 15%, CI 2.56 Fick/2.12 thermo, mean RA 1, PA 39/13 mean 26, mean PCWP 12, normal coronaries. cMRI (7/16) with moderately dilated LV with EF 31%, prominent LV septal-lateral dyssynchrony, normal RV size and systolic function, no myocardial LGE, so no definitive evidence for prior MI, Myocarditis, or infiltrative disease.  SH: Lives with son in [place].  Nonsmoker.  No ETOH or drugs. Works at [employer]  Divorced.   FH: No known heart disease.   ROS: All systems reviewed and negative except as per HPI.   Current Outpatient Prescriptions  Medication Sig Dispense Refill  . albuterol (VENTOLIN HFA)  108 (90 BASE) MCG/ACT inhaler Inhale 2 puffs into the lungs every 6 (six) hours as needed. 3 Inhaler 4  . carvedilol (COREG) 12.5 MG tablet Take 1 tablet (12.5 mg total) by mouth 2 (two) times daily with a meal. 60 tablet 6  . clonazePAM (KLONOPIN) 0.5 MG tablet Take 0.5 mg by mouth 2 (two) times daily as needed for anxiety.     . DULoxetine (CYMBALTA) 60 MG capsule Take 60 mg by mouth daily.    . Fluticasone-Salmeterol (ADVAIR DISKUS) 250-50 MCG/DOSE AEPB Inhale 1 puff into the lungs 2 (two) times daily. 180 each 4  . furosemide (LASIX) 20 MG tablet Take 1 tablet (20 mg total) by mouth every other day. 30 tablet 6  . NON FORMULARY ALLERGY INJECTIONS.  2 shots every 2 weeks    . pantoprazole (PROTONIX) 40 MG tablet Take 40 mg by mouth 2 (two) times daily.     Mary Pratt 80 MCG/ACT AERS Place 1 spray into the nose daily as needed.     . sacubitril-valsartan (ENTRESTO) 24-26 MG Take 1 tablet by mouth 2 (two) times daily. 60 tablet 6  . digoxin (LANOXIN) 0.125 MG tablet Take 1 tablet (0.125 mg total) by mouth daily. 30 tablet 3  . fexofenadine (ALLEGRA) 180 MG tablet Take 180 mg by mouth daily.     No current facility-administered medications for this encounter.   BP 96/58 mmHg  Pulse 70  Wt 137 lb 12.8 oz (62.506 kg)  SpO2 96% General: NAD Neck: No JVD, no thyromegaly or thyroid nodule.  Lungs: Clear to auscultation bilaterally with normal respiratory effort. CV: Nondisplaced PMI.  Heart regular S1/S2, no S3/S4, no murmur.  No peripheral edema.  No carotid bruit.  Normal pedal pulses.  Abdomen: Soft, nontender, no hepatosplenomegaly, no distention.  Skin: Intact without lesions or rashes.  Neurologic: Alert and oriented x 3.  Psych: Normal affect. Extremities: No clubbing or cyanosis.  HEENT: Normal.   Assessment/Plan: Chronic systolic CHF: Nonischemic cardiomyopathy.  Etiology uncertain: normal coronaries, no history of ETOH or drug abuse, no family history of cardiomyopathy.  No prior  history of HTN.  Possible that it is related to viral myocarditis.  NYHA class II symptoms, euvolemic on exam. cMRI in 7/16 showed some improvement of EF to 31%.  Also showed LV dyssynchrony. - Continue current Coreg and Entresto at current doses, no room for titration with orthostatic symptoms.  - Continue Lasix qod. - Repeat echo in 12/16 for ICD.  Borderline candidate for CRT with QRS 134 msec on last ECG, but she has prominent septal-lateral dyssynchrony on imaging so may benefit.  - Given prominent exertional fatigue and soft BP, I will start her on digoxin 0.125 mg daily.  - I will arrange for CPX.   Followup in 1 month.   [name]  05/09/2015

## 2015-05-09 NOTE — Patient Instructions (Signed)
Start Digoxin 0.125 mg daily  Your physician has recommended that you have a cardiopulmonary stress test (CPX). CPX testing is a non-invasive measurement of heart and lung function. It replaces a traditional treadmill stress test. This type of test provides a tremendous amount of information that relates not only to your present condition but also for future outcomes. This test combines measurements of you ventilation, respiratory gas exchange in the lungs, electrocardiogram (EKG), blood pressure and physical response before, during, and following an exercise protocol.  Your physician recommends that you schedule a follow-up appointment in: 1 month

## 2015-05-10 ENCOUNTER — Encounter (HOSPITAL_COMMUNITY)
Admission: RE | Admit: 2015-05-10 | Discharge: 2015-05-10 | Disposition: A | Discharge status: Home / Self Care | Payer: Medicaid Other | Source: Ambulatory Visit | Attending: Cardiovascular Disease | Admitting: Cardiovascular Disease

## 2015-05-10 DIAGNOSIS — I5021 Acute systolic (congestive) heart failure: Secondary | ICD-10-CM | POA: Diagnosis not present

## 2015-05-10 DIAGNOSIS — I429 Cardiomyopathy, unspecified: Secondary | ICD-10-CM | POA: Insufficient documentation

## 2015-05-13 ENCOUNTER — Encounter (HOSPITAL_COMMUNITY): Payer: Medicaid Other

## 2015-05-15 ENCOUNTER — Encounter (HOSPITAL_COMMUNITY): Payer: Medicaid Other

## 2015-05-17 ENCOUNTER — Ambulatory Visit (HOSPITAL_COMMUNITY): Payer: Medicaid Other | Attending: Cardiology

## 2015-05-17 ENCOUNTER — Encounter (HOSPITAL_COMMUNITY): Payer: Medicaid Other

## 2015-05-17 DIAGNOSIS — I428 Other cardiomyopathies: Secondary | ICD-10-CM | POA: Diagnosis present

## 2015-05-20 ENCOUNTER — Encounter (HOSPITAL_COMMUNITY)
Admission: RE | Admit: 2015-05-20 | Discharge: 2015-05-20 | Disposition: A | Discharge status: Home / Self Care | Payer: Medicaid Other | Source: Ambulatory Visit | Attending: Cardiovascular Disease | Admitting: Cardiovascular Disease

## 2015-05-20 DIAGNOSIS — I5021 Acute systolic (congestive) heart failure: Secondary | ICD-10-CM | POA: Diagnosis not present

## 2015-05-20 DIAGNOSIS — I429 Cardiomyopathy, unspecified: Secondary | ICD-10-CM | POA: Diagnosis not present

## 2015-05-22 ENCOUNTER — Encounter (HOSPITAL_COMMUNITY): Payer: Medicaid Other

## 2015-05-22 ENCOUNTER — Encounter (HOSPITAL_COMMUNITY): Payer: Self-pay | Admitting: Emergency Medicine

## 2015-05-22 ENCOUNTER — Emergency Department (HOSPITAL_COMMUNITY): Payer: Medicaid Other

## 2015-05-22 ENCOUNTER — Emergency Department (HOSPITAL_COMMUNITY)
Admission: EM | Admit: 2015-05-22 | Discharge: 2015-05-22 | Disposition: A | Discharge status: Home / Self Care | Payer: Medicaid Other | Attending: Emergency Medicine | Admitting: Emergency Medicine

## 2015-05-22 DIAGNOSIS — J45901 Unspecified asthma with (acute) exacerbation: Secondary | ICD-10-CM | POA: Insufficient documentation

## 2015-05-22 DIAGNOSIS — F4321 Adjustment disorder with depressed mood: Secondary | ICD-10-CM | POA: Diagnosis not present

## 2015-05-22 DIAGNOSIS — Z7951 Long term (current) use of inhaled steroids: Secondary | ICD-10-CM | POA: Diagnosis not present

## 2015-05-22 DIAGNOSIS — R079 Chest pain, unspecified: Secondary | ICD-10-CM | POA: Diagnosis present

## 2015-05-22 DIAGNOSIS — Z880 Allergy status to penicillin: Secondary | ICD-10-CM | POA: Diagnosis not present

## 2015-05-22 DIAGNOSIS — K219 Gastro-esophageal reflux disease without esophagitis: Secondary | ICD-10-CM | POA: Diagnosis not present

## 2015-05-22 DIAGNOSIS — I5022 Chronic systolic (congestive) heart failure: Secondary | ICD-10-CM | POA: Insufficient documentation

## 2015-05-22 DIAGNOSIS — F419 Anxiety disorder, unspecified: Secondary | ICD-10-CM | POA: Diagnosis not present

## 2015-05-22 DIAGNOSIS — Z862 Personal history of diseases of the blood and blood-forming organs and certain disorders involving the immune mechanism: Secondary | ICD-10-CM | POA: Insufficient documentation

## 2015-05-22 DIAGNOSIS — Z9889 Other specified postprocedural states: Secondary | ICD-10-CM | POA: Insufficient documentation

## 2015-05-22 DIAGNOSIS — Z79899 Other long term (current) drug therapy: Secondary | ICD-10-CM | POA: Insufficient documentation

## 2015-05-22 DIAGNOSIS — Z8619 Personal history of other infectious and parasitic diseases: Secondary | ICD-10-CM | POA: Diagnosis not present

## 2015-05-22 LAB — CBC
HCT: 35.3 % — ABNORMAL LOW (ref 36.0–46.0)
Hemoglobin: 12.1 g/dL (ref 12.0–15.0)
MCH: 28.6 pg (ref 26.0–34.0)
MCHC: 34.3 g/dL (ref 30.0–36.0)
MCV: 83.5 fL (ref 78.0–100.0)
Platelets: 232 10*3/uL (ref 150–400)
RBC: 4.23 MIL/uL (ref 3.87–5.11)
RDW: 15.1 % (ref 11.5–15.5)
WBC: 4.5 10*3/uL (ref 4.0–10.5)

## 2015-05-22 LAB — BASIC METABOLIC PANEL
Anion gap: 6 (ref 5–15)
BUN: 13 mg/dL (ref 6–20)
CO2: 27 mmol/L (ref 22–32)
Calcium: 8.7 mg/dL — ABNORMAL LOW (ref 8.9–10.3)
Chloride: 103 mmol/L (ref 101–111)
Creatinine, Ser: 1.01 mg/dL — ABNORMAL HIGH (ref 0.44–1.00)
GFR calc Af Amer: >60 mL/min (ref >60)
GFR calc non Af Amer: >60 mL/min (ref >60)
Glucose, Bld: 109 mg/dL — ABNORMAL HIGH (ref 65–99)
Potassium: 3.8 mmol/L (ref 3.5–5.1)
Sodium: 136 mmol/L (ref 135–145)

## 2015-05-22 LAB — TROPONIN I: Troponin I: <0.03 ng/mL (ref <0.031)

## 2015-05-22 NOTE — ED Provider Notes (Signed)
CSN: [id number]     Arrival date & time 05/22/15  1056 History  This chart was scribed for Ripley Fraise, MD by Terressa Koyanagi, ED Scribe. This patient was seen in room APA19/APA19 and the patient's care was started at 12:40 PM.   Chief Complaint  Patient presents with  . Chest Pain   The history is provided by the patient. No language interpreter was used.   PCP: Gar Ponto, MD  CARDIOLOGIST: [name]  HPI Comments: Mary Pratt is a 44 y.o. female, with PMHx noted below including chronic systolic CHF, NICM, and cardiac catheterization (02/2015),  who presents to the Emergency Department complaining of atraumatic, sudden onset, intermittent, dull/aching/sharp, 2/10 chest pain with associated dizziness and mild SOB onset last night while pt was packing. Pt notes each episode of chest pain lasts a few seconds; pt reports her last episode of chest pain was several hours ago. Pt denies fever, vomiting, abd pain, weakness in BUE or BLE, hx of blood clot, BC use, tobacco use, long distance travel recently, recent surgeries, pain/swelling of BLE, recent LOC, seizures, headache.    Past Medical History  Diagnosis Date  . GERD with stricture     dilated 2010  . Asthma   . Functional dyspepsia     early satiety   . 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 Sickle cell trait   . HELICOBACTER PYLORI GASTRITIS 01/15/2009    dyspnea, ? reaction to Biaxin/amoxicillin  Pylera 01/15/09 - completed therapy     . Anxiety   . Chronic systolic CHF (congestive heart failure)     a. 02/2015 Echo: EF 20%, sev dil w/ diff HK, worse @ inf base. Tiv AI, mild MR, mod dil LA, PASP 45mmHg.  . Situational depression     "son died"  . NICM (nonischemic cardiomyopathy)     a. 02/2015 Cath: nl cors, EF 15%, mild PAH;  b. 02/2015 Enrolled in VEST trial.   Past Surgical History  Procedure Laterality Date  . Wisdom tooth extraction    . Esophagogastroduodenoscopy (egd) with esophageal dilation   11/30/2008    esophageal ring dilated 68 French, H. pylori gastritis  . Colposcopy  2013    neg  . Tubal ligation  1991  . Cardiac catheterization N/A 02/26/2015    Procedure: Right/Left Heart Cath and Coronary Angiography;  Surgeon: Troy Sine, MD;  Location: [facility];  Service: Cardiovascular;  Laterality: N/A;   Family History  Problem Relation Age of Onset  . Colon cancer Neg Hx   . Diabetes Mother   . Hypertension Mother   . Hypothyroidism Mother   . Breast cancer Maternal Aunt   . Brain cancer Maternal Uncle   . Lung cancer Maternal Grandmother   . Colon polyps Neg Hx   . Esophageal cancer Neg Hx    Social History  Substance Use Topics  . Smoking status: Never Smoker   . Smokeless tobacco: Never Used  . Alcohol Use: No   OB History    Gravida Para Term Preterm AB TAB SAB Ectopic Multiple Living   3 3 3       2      Review of Systems  Constitutional: Negative for fever.  Cardiovascular: Positive for chest pain. Negative for leg swelling.  Musculoskeletal: Negative for arthralgias (BLE ).  Neurological: Positive for dizziness. Negative for seizures, syncope, weakness and headaches.  All other systems reviewed and are negative.  Allergies  Amoxicillin and Clarithromycin  Home Medications   Prior to Admission medications   Medication Sig Start Date End Date Taking? Authorizing Provider  albuterol (VENTOLIN HFA) 108 (90 BASE) MCG/ACT inhaler Inhale 2 puffs into the lungs every 6 (six) hours as needed. 05/20/12  Yes Kathee Delton, MD  carvedilol (COREG) 12.5 MG tablet Take 1 tablet (12.5 mg total) by mouth 2 (two) times daily with a meal. 02/27/15  Yes Rogelia Mire, NP  clonazePAM (KLONOPIN) 0.5 MG tablet Take 0.5 mg by mouth 2 (two) times daily as needed for anxiety.    Yes Historical Provider, MD  digoxin (LANOXIN) 0.125 MG tablet Take 1 tablet (0.125 mg total) by mouth daily. 05/09/15  Yes Jolaine Artist, MD  DULoxetine (CYMBALTA) 60 MG capsule  Take 60 mg by mouth daily.   Yes Historical Provider, MD  Fluticasone-Salmeterol (ADVAIR DISKUS) 250-50 MCG/DOSE AEPB Inhale 1 puff into the lungs 2 (two) times daily. 03/23/13  Yes Kathee Delton, MD  furosemide (LASIX) 20 MG tablet Take 1 tablet (20 mg total) by mouth every other day. 04/11/15  Yes Jolaine Artist, MD  loratadine (CLARITIN) 10 MG tablet Take 10 mg by mouth daily.   Yes Historical Provider, MD  NON FORMULARY ALLERGY INJECTIONS.  2 shots every 2 weeks   Yes Historical Provider, MD  pantoprazole (PROTONIX) 40 MG tablet Take 40 mg by mouth 2 (two) times daily.  01/31/15  Yes Historical Provider, MD  QNASL 80 MCG/ACT AERS Place 1 spray into the nose daily as needed.  09/18/14  Yes Historical Provider, MD  sacubitril-valsartan (ENTRESTO) 24-26 MG Take 1 tablet by mouth 2 (two) times daily. 02/28/15  Yes Rogelia Mire, NP   Triage Vitals: BP 106/73 mmHg  Pulse 76  Temp(Src) 98.2 F (36.8 C) (Oral)  Resp 12  Ht 5\' 1"  (1.549 m)  Wt 137 lb (62.143 kg)  BMI 25.90 kg/m2  SpO2 100%  LMP 04/27/2015 Physical Exam CONSTITUTIONAL: Well developed/well nourished HEAD: Normocephalic/atraumatic EYES: EOMI/PERRL ENMT: Mucous membranes moist NECK: supple no meningeal signs SPINE/BACK:entire spine nontender CV: S1/S2 noted, no murmurs/rubs/gallops noted LUNGS: Lungs are clear to auscultation bilaterally, no apparent distress ABDOMEN: soft, nontender, no rebound or guarding, bowel sounds noted throughout abdomen GU:no cva tenderness NEURO: Pt is awake/alert/appropriate, moves all extremitiesx4.  No facial droop.   EXTREMITIES: pulses normal/equal, full ROM, no calf tenderness or edema.  SKIN: warm, color normal PSYCH: no abnormalities of mood noted, alert and oriented to situation  ED Course  Procedures   DIAGNOSTIC STUDIES: Oxygen Saturation is 100% on RA, nl by my interpretation.    COORDINATION OF CARE: 12:45 PM: Discussed treatment plan with pt at bedside; patient  verbalizes understanding and agrees with treatment plan. 1:27 PM Pt with h/o non-ischemic CM with normal coronaries Denies syncope to suggest arrythmia She is well appearing Appears low risk for PE at this time I spoke to Dr Domenic Polite with cardiology.  We reviewed chart/history She has CHF followup arranged From cardiology standpoint he does not recommend any further testing  Discussed with patient She denies any new complaints I doubt ACS/PE/Dissection/pericardial effusion I feel she is safe for d/c home I advised to limit activity I advised to return for any worsening CP or for any new syncope - advised to call [facility] - Abnormal; Notable for the following:    Glucose, Bld 109 (*)    Creatinine, Ser 1.01 (*)    Calcium 8.7 (*)  All other components within normal limits  CBC - Abnormal; Notable for the following:    HCT 35.3 (*)    All other components within normal limits  TROPONIN I    Imaging Review Dg Chest 2 View  05/22/2015   CLINICAL DATA:  Dizziness, weakness onset last night.  Chest pain.  EXAM: CHEST  2 VIEW  COMPARISON:  02/18/2015  FINDINGS: Mild cardiomegaly. Lungs are clear. No effusions. No acute bony abnormality.  IMPRESSION: Cardiomegaly.  No active disease.   Electronically Signed   By: Rolm Baptise M.D.   On: 05/22/2015 11:38   I have personally reviewed and evaluated these images and lab results as part of my medical decision-making.   EKG Interpretation   Date/Time:  Wednesday May 22 2015 11:04:42 EDT Ventricular Rate:  70 PR Interval:  178 QRS Duration: 151 QT Interval:  431 QTC Calculation: 465 R Axis:   88 Text Interpretation:  Sinus rhythm Left bundle branch block No significant  change since last tracing Confirmed by Christy Gentles  MD, Elenore Rota ([id number]) on  05/22/2015 12:36:53 PM      MDM   Final diagnoses:  Chest pain, unspecified chest pain type    Nursing notes including past medical  history and social history reviewed and considered in documentation Labs/vital reviewed myself and considered during evaluation xrays/imaging reviewed by myself and considered during evaluation    I personally performed the services described in this documentation, which was scribed in my presence. The recorded information has been reviewed and is accurate.      Ripley Fraise, MD 05/22/15 [phone]

## 2015-05-22 NOTE — Discharge Instructions (Signed)

## 2015-05-22 NOTE — ED Notes (Signed)
Pt c/o cp since last night with dizziness.

## 2015-05-23 ENCOUNTER — Telehealth (HOSPITAL_COMMUNITY): Payer: Self-pay | Admitting: *Deleted

## 2015-05-23 ENCOUNTER — Encounter (HOSPITAL_COMMUNITY): Payer: Self-pay | Admitting: *Deleted

## 2015-05-23 NOTE — Telephone Encounter (Signed)
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 She said this is so she can move to an apt that is more affordable.  I told her we could write a letter stating that she is temporarily out of work because Dr.McLean has has not permanently taken her out of work.  Will type letter and have the doctor sign.  Fax to [facility]

## 2015-05-24 ENCOUNTER — Encounter (HOSPITAL_COMMUNITY)
Admission: RE | Admit: 2015-05-24 | Discharge: 2015-05-24 | Disposition: A | Discharge status: Home / Self Care | Payer: Medicaid Other | Source: Ambulatory Visit | Attending: Cardiovascular Disease | Admitting: Cardiovascular Disease

## 2015-05-24 ENCOUNTER — Telehealth: Payer: Self-pay | Admitting: *Deleted

## 2015-05-24 DIAGNOSIS — I5021 Acute systolic (congestive) heart failure: Secondary | ICD-10-CM | POA: Diagnosis not present

## 2015-05-24 NOTE — Telephone Encounter (Signed)
Pt requesting note to be out of work until f/u with Dr. Bronson Ing on 06/18/15 due relocating to different home and exertion. Pt aware that Dr. Bronson Ing is out of office and will forward to DOD

## 2015-05-27 ENCOUNTER — Encounter (HOSPITAL_COMMUNITY): Payer: Medicaid Other

## 2015-05-28 ENCOUNTER — Telehealth (HOSPITAL_COMMUNITY): Payer: Self-pay | Admitting: *Deleted

## 2015-05-28 NOTE — Telephone Encounter (Signed)
[employer] disability request for medical information Sent over her last office note from 05/09/15 Claim # [id number]

## 2015-05-28 NOTE — Telephone Encounter (Signed)
Her most recent extensive evaluation was in CHF clinic with Dr Marigene Ehlers, can we forward this message to them   Zandra Abts MD

## 2015-05-29 ENCOUNTER — Encounter (HOSPITAL_COMMUNITY): Payer: Medicaid Other

## 2015-05-29 NOTE — Telephone Encounter (Signed)
OK to keep her out of work due to dyspnea and lightheadedness until 10/11.

## 2015-05-29 NOTE — Telephone Encounter (Signed)
Routed to Dr. Marigene Ehlers

## 2015-05-31 ENCOUNTER — Encounter (HOSPITAL_COMMUNITY)
Admission: RE | Admit: 2015-05-31 | Discharge: 2015-05-31 | Disposition: A | Discharge status: Home / Self Care | Payer: Medicaid Other | Source: Ambulatory Visit | Attending: Cardiovascular Disease | Admitting: Cardiovascular Disease

## 2015-05-31 DIAGNOSIS — I5021 Acute systolic (congestive) heart failure: Secondary | ICD-10-CM | POA: Diagnosis not present

## 2015-05-31 NOTE — Telephone Encounter (Signed)
Spoke with pt and says she doesn't need work letter at this time and requested to cx appt with Dr. Bronson Ing 06/18/15. Says that she is in cardiac rehab and also seeing Dr. Marigene Ehlers. Pt refused to reschedule at this time. Will place recall.

## 2015-06-03 ENCOUNTER — Encounter (HOSPITAL_COMMUNITY)
Admission: RE | Admit: 2015-06-03 | Discharge: 2015-06-03 | Disposition: A | Discharge status: Home / Self Care | Payer: Medicaid Other | Source: Ambulatory Visit | Attending: Cardiovascular Disease | Admitting: Cardiovascular Disease

## 2015-06-03 DIAGNOSIS — I5021 Acute systolic (congestive) heart failure: Secondary | ICD-10-CM | POA: Diagnosis not present

## 2015-06-03 NOTE — Progress Notes (Signed)
Cardiac Rehabilitation Program Outcomes Report   Orientation:  04/02/15 Graduate Date:  tbd Discharge Date:  tbd # of sessions completed: 18  Cardiologist: Gaynelle Cage MD:  [place] Span Time:  1100  A.  Exercise Program:  Tolerates exercise @ 3.37 METS for 15 minutes  B.  Mental Health:  Good mental attitude  C.  Education/Instruction/Skills  Accurately checks own pulse.  Rest:  99  Exercise:  119 and Knows THR for exercise  Uses Perceived Exertion Scale and/or Dyspnea Scale  D.  Nutrition/Weight Control/Body Composition:  Adherence to prescribed nutrition program: fair    E.  Blood Lipids   No results found for: CHOL, HDL, LDLCALC, LDLDIRECT, TRIG, CHOLHDL  F.  Lifestyle Changes:  Making positive lifestyle changes and Continues to smoke  G.  Symptoms noted with exercise:  Asymptomatic  Report Completed By:  Stevphen Rochester RN   Comments:  This is the patients half way progress note for [facility].

## 2015-06-05 ENCOUNTER — Encounter (HOSPITAL_COMMUNITY): Payer: Medicaid Other

## 2015-06-07 ENCOUNTER — Encounter (HOSPITAL_COMMUNITY)
Admission: RE | Admit: 2015-06-07 | Discharge: 2015-06-07 | Disposition: A | Discharge status: Home / Self Care | Payer: Medicaid Other | Source: Ambulatory Visit | Attending: Cardiovascular Disease | Admitting: Cardiovascular Disease

## 2015-06-07 DIAGNOSIS — I5021 Acute systolic (congestive) heart failure: Secondary | ICD-10-CM | POA: Diagnosis not present

## 2015-06-10 ENCOUNTER — Encounter (HOSPITAL_COMMUNITY): Payer: Medicaid Other

## 2015-06-11 ENCOUNTER — Encounter (HOSPITAL_COMMUNITY): Payer: Self-pay | Admitting: *Deleted

## 2015-06-11 ENCOUNTER — Ambulatory Visit (HOSPITAL_COMMUNITY)
Admission: RE | Admit: 2015-06-11 | Discharge: 2015-06-11 | Disposition: A | Discharge status: Home / Self Care | Payer: Medicaid Other | Source: Ambulatory Visit | Attending: Internal Medicine | Admitting: Internal Medicine

## 2015-06-11 VITALS — BP 120/78 | HR 81 | Wt 137.0 lb

## 2015-06-11 DIAGNOSIS — I5022 Chronic systolic (congestive) heart failure: Secondary | ICD-10-CM | POA: Diagnosis not present

## 2015-06-11 DIAGNOSIS — D573 Sickle-cell trait: Secondary | ICD-10-CM | POA: Insufficient documentation

## 2015-06-11 DIAGNOSIS — Z79899 Other long term (current) drug therapy: Secondary | ICD-10-CM | POA: Insufficient documentation

## 2015-06-11 DIAGNOSIS — J45909 Unspecified asthma, uncomplicated: Secondary | ICD-10-CM | POA: Diagnosis not present

## 2015-06-11 DIAGNOSIS — I429 Cardiomyopathy, unspecified: Secondary | ICD-10-CM

## 2015-06-11 DIAGNOSIS — K219 Gastro-esophageal reflux disease without esophagitis: Secondary | ICD-10-CM | POA: Diagnosis not present

## 2015-06-11 DIAGNOSIS — I428 Other cardiomyopathies: Secondary | ICD-10-CM | POA: Insufficient documentation

## 2015-06-11 MED ORDER — CARVEDILOL 12.5 MG PO TABS: ORAL_TABLET | ORAL | Status: DC

## 2015-06-11 NOTE — Patient Instructions (Signed)
FOLLOW UP: 4 weeks with [name] (bmet during visit)  INCREASE Carvedilol to 12.5mg  in the AM and 18.75mg  in the PM.

## 2015-06-11 NOTE — Progress Notes (Signed)
Advanced Heart Failure Medication Review by a Pharmacist  Does the patient  feel that his/her medications are working for him/her?  yes  Has the patient been experiencing any side effects to the medications prescribed?  no  Does the patient measure his/her own blood pressure or blood glucose at home?  no   Does the patient have any problems obtaining medications due to transportation or finances?   no  Understanding of regimen: good Understanding of indications: good Potential of compliance: good Patient understands to avoid NSAIDs. Patient understands to avoid decongestants.  Issues to address at subsequent visits: Entresto coverage [facility]   Pharmacist comments:  Mary Pratt is a pleasant 44 yo F presenting without a medication list but with good recall of each of her medications including dosages. She reports excellent compliance with all of her medications. She states that she has been obtaining her Entresto through our Outpatient Pharmacy at no cost. She did not have any other medication-related questions or concerns for me at this time.   Mary Pratt. Velva Harman, PharmD, BCPS, CPP Clinical Pharmacist Pager: [phone] Phone: [phone] 06/11/2015 10:14 AM      Time with patient: 6 minutes Preparation and documentation time: 2 minutes Total time: 8 minutes

## 2015-06-11 NOTE — Progress Notes (Signed)
Patient ID: [place], female   DOB: [birth year], 44 y.o.   MRN: [id number]  PCP: Dr. Gar Ponto Primary HF: [name]  44 yo presents for evaluation of nonischemic dilated cardiomyopathy.  She developed exertional dyspnea and orthopnea in 5/16.  She had had no prior known cardiac problems.  She was admitted in 6/16 with CHF.  Echo showed EF 20% with severe LV dilation.  Coronary angiography showed normal coronaries.  RHC showed preserved cardiac output.  She was diuresed and started on cardiac meds.    Today she returns forr HF follow up. SOB with inclines and hills but otherwise ok.  Ongoing leg fatigue. Weight at home 136-137  pounds. Takes extra lasix twice a week . Following low salt diet and limiting fluids. Attending cardiac rehab at [facility]. Taking all medications.     CPX 05/20/15 Peak VO2: 15.9 (56.1% predicted peak VO2) VE/VCO2 slope: 23.9 OUES: 1.32 Peak RER: 1.09   Labs (6/16): K 4.5, creatinine 1.11, TSH normal Labs (7/16): K 3.8, creatinine 1.13, BNP 353, SPEP negative Labs (8/16): K 4.2, creatinine 1.11 Labs (05/22/2015): K 3.8 Creatinine 1.01   PMH: 1. GERD 2. Asthma 3. Sickle cell trait 4. H/o H pylori gastritis 5. Cardiomyopathy: Nonischemic.  Echo (6/16) with EF 20%, severe LV dilation, mild MR, PA systolic pressure 54 mmHg.  LHC/RHC (6/16) with EF 15%, CI 2.56 Fick/2.12 thermo, mean RA 1, PA 39/13 mean 26, mean PCWP 12, normal coronaries. cMRI (7/16) with moderately dilated LV with EF 31%, prominent LV septal-lateral dyssynchrony, normal RV size and systolic function, no myocardial LGE, so no definitive evidence for prior MI, Myocarditis, or infiltrative disease.  SH: Lives with son in [place].  Nonsmoker.  No ETOH or drugs. Works at [employer]  Divorced.   FH: No known heart disease.   ROS: All systems reviewed and negative except as per HPI.   Current Outpatient Prescriptions  Medication Sig Dispense Refill  . albuterol (PROVENTIL HFA;VENTOLIN HFA) 108 (90 BASE)  MCG/ACT inhaler Inhale into the lungs every 6 (six) hours as needed for wheezing or shortness of breath.    . carvedilol (COREG) 12.5 MG tablet Take 1 tablet (12.5 mg total) by mouth 2 (two) times daily with a meal. 60 tablet 6  . clonazePAM (KLONOPIN) 0.5 MG tablet Take 0.5 mg by mouth 2 (two) times daily as needed for anxiety.     . digoxin (LANOXIN) 0.125 MG tablet Take 1 tablet (0.125 mg total) by mouth daily. 30 tablet 3  . DULoxetine (CYMBALTA) 60 MG capsule Take 60 mg by mouth daily.    . Fluticasone-Salmeterol (ADVAIR DISKUS) 250-50 MCG/DOSE AEPB Inhale 1 puff into the lungs 2 (two) times daily. 180 each 4  . furosemide (LASIX) 20 MG tablet Take 1 tablet (20 mg total) by mouth every other day. 30 tablet 6  . loratadine (CLARITIN) 10 MG tablet Take 10 mg by mouth daily as needed for allergies.     . NON FORMULARY ALLERGY INJECTIONS.  2 shots every 2 weeks    . pantoprazole (PROTONIX) 40 MG tablet Take 40 mg by mouth 2 (two) times daily.     [name] 80 MCG/ACT AERS Place 1 spray into the nose daily as needed (allergic rhinitis).     . sacubitril-valsartan (ENTRESTO) 24-26 MG Take 1 tablet by mouth 2 (two) times daily. 60 tablet 6   No current facility-administered medications for this encounter.   BP 120/78 mmHg  Pulse 81  Wt 137 lb (62.143 kg)  SpO2 99%  LMP 04/27/2015  General: NAD. Ambulated in the clinic without difficulty.  Neck: No JVD, no thyromegaly or thyroid nodule.  Lungs: Clear to auscultation bilaterally with normal respiratory effort. CV: Nondisplaced PMI.  Heart regular S1/S2, no S3/S4, no murmur.  No peripheral edema.  No carotid bruit.  Normal pedal pulses.  Abdomen: Soft, nontender, no hepatosplenomegaly, no distention.  Skin: Intact without lesions or rashes.  Neurologic: Alert and oriented x 3.  Psych: Normal affect. Extremities: No clubbing or cyanosis.  HEENT: Normal.   Assessment/Plan: Chronic systolic CHF: Nonischemic cardiomyopathy.  Etiology uncertain:  normal coronaries, no history of ETOH or drug abuse, no family history of cardiomyopathy.  No prior history of HTN.  Possible that it is related to viral myocarditis.   cMRI in 7/16 showed some improvement of EF to 31%.  Also showed LV dyssynchrony. -Discussed CPX results. CPX test with moderate functional limitation from heart failure. Not curretnly in the range for advanced therapies. Can repeat 6 months.  - NYHA II. Volume status stable. Continue lasix 20 mg every other day.  - Continue coreg 12.5 mg in am and increase pm dose to 18.75 mg. Continue digoxin 0.125 mg daily.   -Continue current dose of entresto.  - Repeat echo in 12/16 for ICD.  Borderline candidate for CRT with QRS 134 msec on last ECG, but she has prominent septal-lateral dyssynchrony on imaging so may benefit.  -Continue cardiac rehab.    Follow up in 1 month with BMET and dig level.  Marland Kitchen   CLEGG,AMY  06/11/2015

## 2015-06-12 ENCOUNTER — Encounter (HOSPITAL_COMMUNITY): Payer: Medicaid Other

## 2015-06-12 DIAGNOSIS — I5022 Chronic systolic (congestive) heart failure: Secondary | ICD-10-CM | POA: Insufficient documentation

## 2015-06-14 ENCOUNTER — Encounter (HOSPITAL_COMMUNITY)
Admission: RE | Admit: 2015-06-14 | Discharge: 2015-06-14 | Disposition: A | Discharge status: Home / Self Care | Payer: Medicaid Other | Source: Ambulatory Visit | Attending: Cardiovascular Disease | Admitting: Cardiovascular Disease

## 2015-06-14 DIAGNOSIS — I5021 Acute systolic (congestive) heart failure: Secondary | ICD-10-CM | POA: Diagnosis not present

## 2015-06-14 DIAGNOSIS — I429 Cardiomyopathy, unspecified: Secondary | ICD-10-CM | POA: Diagnosis not present

## 2015-06-17 ENCOUNTER — Encounter (HOSPITAL_COMMUNITY): Payer: Self-pay | Admitting: *Deleted

## 2015-06-17 ENCOUNTER — Encounter (HOSPITAL_COMMUNITY)
Admission: RE | Admit: 2015-06-17 | Discharge: 2015-06-17 | Disposition: A | Discharge status: Home / Self Care | Payer: Medicaid Other | Source: Ambulatory Visit | Attending: Cardiovascular Disease | Admitting: Cardiovascular Disease

## 2015-06-17 DIAGNOSIS — I5021 Acute systolic (congestive) heart failure: Secondary | ICD-10-CM | POA: Diagnosis not present

## 2015-06-17 NOTE — Progress Notes (Signed)
[employer] Provider Statement form completed and signed by Dr Rodena Medin pt and faxed into [phone]

## 2015-06-18 ENCOUNTER — Ambulatory Visit: Payer: BLUE CROSS/BLUE SHIELD | Admitting: Cardiovascular Disease

## 2015-06-19 ENCOUNTER — Encounter (HOSPITAL_COMMUNITY)
Admission: RE | Admit: 2015-06-19 | Discharge: 2015-06-19 | Disposition: A | Discharge status: Home / Self Care | Payer: Medicaid Other | Source: Ambulatory Visit | Attending: Cardiovascular Disease | Admitting: Cardiovascular Disease

## 2015-06-19 ENCOUNTER — Encounter (HOSPITAL_COMMUNITY): Payer: Self-pay

## 2015-06-19 DIAGNOSIS — I5021 Acute systolic (congestive) heart failure: Secondary | ICD-10-CM | POA: Diagnosis not present

## 2015-06-21 ENCOUNTER — Encounter (HOSPITAL_COMMUNITY)
Admission: RE | Admit: 2015-06-21 | Discharge: 2015-06-21 | Disposition: A | Discharge status: Home / Self Care | Payer: Medicaid Other | Source: Ambulatory Visit | Attending: Cardiovascular Disease | Admitting: Cardiovascular Disease

## 2015-06-21 DIAGNOSIS — I5021 Acute systolic (congestive) heart failure: Secondary | ICD-10-CM | POA: Diagnosis not present

## 2015-06-24 ENCOUNTER — Encounter (HOSPITAL_COMMUNITY)
Admission: RE | Admit: 2015-06-24 | Discharge: 2015-06-24 | Disposition: A | Discharge status: Home / Self Care | Payer: Medicaid Other | Source: Ambulatory Visit | Attending: Cardiovascular Disease | Admitting: Cardiovascular Disease

## 2015-06-24 DIAGNOSIS — I5021 Acute systolic (congestive) heart failure: Secondary | ICD-10-CM | POA: Diagnosis not present

## 2015-06-26 ENCOUNTER — Encounter (HOSPITAL_COMMUNITY)
Admission: RE | Admit: 2015-06-26 | Discharge: 2015-06-26 | Disposition: A | Discharge status: Home / Self Care | Payer: Medicaid Other | Source: Ambulatory Visit | Attending: Cardiovascular Disease | Admitting: Cardiovascular Disease

## 2015-06-26 DIAGNOSIS — I5021 Acute systolic (congestive) heart failure: Secondary | ICD-10-CM | POA: Diagnosis not present

## 2015-06-28 ENCOUNTER — Encounter (HOSPITAL_COMMUNITY)
Admission: RE | Admit: 2015-06-28 | Discharge: 2015-06-28 | Disposition: A | Discharge status: Home / Self Care | Payer: Medicaid Other | Source: Ambulatory Visit | Attending: Cardiovascular Disease | Admitting: Cardiovascular Disease

## 2015-06-28 DIAGNOSIS — I5021 Acute systolic (congestive) heart failure: Secondary | ICD-10-CM | POA: Diagnosis not present

## 2015-07-01 ENCOUNTER — Encounter (HOSPITAL_COMMUNITY)
Admission: RE | Admit: 2015-07-01 | Discharge: 2015-07-01 | Disposition: A | Discharge status: Home / Self Care | Payer: Medicaid Other | Source: Ambulatory Visit | Attending: Cardiovascular Disease | Admitting: Cardiovascular Disease

## 2015-07-01 DIAGNOSIS — I5021 Acute systolic (congestive) heart failure: Secondary | ICD-10-CM | POA: Diagnosis not present

## 2015-07-03 ENCOUNTER — Encounter (HOSPITAL_COMMUNITY): Payer: Medicaid Other

## 2015-07-05 ENCOUNTER — Encounter (HOSPITAL_COMMUNITY)
Admission: RE | Admit: 2015-07-05 | Discharge: 2015-07-05 | Disposition: A | Discharge status: Home / Self Care | Payer: Medicaid Other | Source: Ambulatory Visit | Attending: Cardiovascular Disease | Admitting: Cardiovascular Disease

## 2015-07-05 DIAGNOSIS — I5021 Acute systolic (congestive) heart failure: Secondary | ICD-10-CM | POA: Diagnosis not present

## 2015-07-08 ENCOUNTER — Encounter (HOSPITAL_COMMUNITY): Payer: Self-pay | Admitting: *Deleted

## 2015-07-08 ENCOUNTER — Telehealth (HOSPITAL_COMMUNITY): Payer: Self-pay

## 2015-07-08 ENCOUNTER — Encounter (HOSPITAL_COMMUNITY): Payer: Medicaid Other

## 2015-07-08 ENCOUNTER — Ambulatory Visit (HOSPITAL_COMMUNITY)
Admission: RE | Admit: 2015-07-08 | Discharge: 2015-07-08 | Disposition: A | Discharge status: Home / Self Care | Payer: Medicaid Other | Source: Ambulatory Visit | Attending: Cardiology | Admitting: Cardiology

## 2015-07-08 VITALS — BP 104/70 | HR 80 | Wt 140.0 lb

## 2015-07-08 DIAGNOSIS — I428 Other cardiomyopathies: Secondary | ICD-10-CM | POA: Diagnosis not present

## 2015-07-08 DIAGNOSIS — Z79899 Other long term (current) drug therapy: Secondary | ICD-10-CM | POA: Diagnosis not present

## 2015-07-08 DIAGNOSIS — J45909 Unspecified asthma, uncomplicated: Secondary | ICD-10-CM | POA: Insufficient documentation

## 2015-07-08 DIAGNOSIS — K219 Gastro-esophageal reflux disease without esophagitis: Secondary | ICD-10-CM | POA: Insufficient documentation

## 2015-07-08 DIAGNOSIS — D573 Sickle-cell trait: Secondary | ICD-10-CM | POA: Diagnosis not present

## 2015-07-08 DIAGNOSIS — I5022 Chronic systolic (congestive) heart failure: Secondary | ICD-10-CM | POA: Diagnosis not present

## 2015-07-08 LAB — BASIC METABOLIC PANEL
Anion gap: 8 (ref 5–15)
BUN: 7 mg/dL (ref 6–20)
CO2: 27 mmol/L (ref 22–32)
Calcium: 9.6 mg/dL (ref 8.9–10.3)
Chloride: 103 mmol/L (ref 101–111)
Creatinine, Ser: 0.98 mg/dL (ref 0.44–1.00)
GFR calc Af Amer: >60 mL/min (ref >60)
GFR calc non Af Amer: >60 mL/min (ref >60)
Glucose, Bld: 97 mg/dL (ref 65–99)
Potassium: 3.9 mmol/L (ref 3.5–5.1)
Sodium: 138 mmol/L (ref 135–145)

## 2015-07-08 LAB — DIGOXIN LEVEL: Digoxin Level: 1.3 ng/mL (ref 0.8–2.0)

## 2015-07-08 MED ORDER — SPIRONOLACTONE 25 MG PO TABS: 12.5000 mg | ORAL_TABLET | Freq: Every day | ORAL | Status: DC

## 2015-07-08 NOTE — Patient Instructions (Signed)
START Spironolactone 12.5 mg, one half tad at bedtime  Labs today and again in 2 weeks (BMET)  Your physician recommends that you schedule a follow-up appointment in: 1 month  Do the following things EVERYDAY: 1) Weigh yourself in the morning before breakfast. Write it down and keep it in a log. 2) Take your medicines as prescribed 3) Eat low salt foods-Limit salt (sodium) to 2000 mg per day.  4) Stay as active as you can everyday 5) Limit all fluids for the day to less than 2 liters 6)

## 2015-07-08 NOTE — Telephone Encounter (Signed)
Medication Samples have been provided to the patient.  Drug name: [name]: 56 tablets  LOT: [id number]  Exp.Date: 12/2015  The patient has been instructed regarding the correct time, dose, and frequency of taking this medication, including desired effects and most common side effects.   [place] 12:09 PM 07/08/2015

## 2015-07-09 NOTE — Progress Notes (Signed)
Patient ID: [place], female   DOB: October 23, 1970, 44 y.o.   MRN: [id number] PCP: Dr. Gar Ponto HF Cardiology: [name]  44 yo presents for evaluation of nonischemic dilated cardiomyopathy.  She developed exertional dyspnea and orthopnea in 5/16.  She had had no prior known cardiac problems.  She was admitted in 6/16 with CHF.  Echo showed EF 20% with severe LV dilation.  Coronary angiography showed normal coronaries.  RHC showed preserved cardiac output.  She was diuresed and started on cardiac meds.    Today she returns forr HF follow up. SOB with inclines and hills but otherwise ok.  Weight is up 3 lbs since last appointment.  Takes extra lasix once or twice a week . Following low salt diet and limiting fluids. Attending cardiac rehab at [facility].   Rare atypical chest pain.       Labs (6/16): K 4.5, creatinine 1.11, TSH normal Labs (7/16): K 3.8, creatinine 1.13, BNP 353, SPEP negative Labs (8/16): K 4.2, creatinine 1.11 Labs (05/22/2015): K 3.8 Creatinine 1.01   PMH: 1. GERD 2. Asthma 3. Sickle cell trait 4. H/o H pylori gastritis 5. Cardiomyopathy: Nonischemic.  Echo (6/16) with EF 20%, severe LV dilation, mild MR, PA systolic pressure 54 mmHg.  LHC/RHC (6/16) with EF 15%, CI 2.56 Fick/2.12 thermo, mean RA 1, PA 39/13 mean 26, mean PCWP 12, normal coronaries. cMRI (7/16) with moderately dilated LV with EF 31%, prominent LV septal-lateral dyssynchrony, normal RV size and systolic function, no myocardial LGE, so no definitive evidence for prior MI, Myocarditis, or infiltrative disease.  CPX (9/16) with peak VO2 15.9, VE/VCO2 slope 23.9, RER 1.09 => moderate functional impairment.   SH: Lives with son in [place].  Nonsmoker.  No ETOH or drugs. Works at [employer]  Divorced.   FH: No known heart disease.   ROS: All systems reviewed and negative except as per HPI.   Current Outpatient Prescriptions  Medication Sig Dispense Refill  . albuterol (PROVENTIL HFA;VENTOLIN HFA) 108 (90 BASE) MCG/ACT  inhaler Inhale into the lungs every 6 (six) hours as needed for wheezing or shortness of breath.    . carvedilol (COREG) 12.5 MG tablet Take 12.5mg  ( tablet)  in the AM and 18.75 PM (1 & 1/2 tablets) 90 tablet 6  . clonazePAM (KLONOPIN) 0.5 MG tablet Take 0.5 mg by mouth 2 (two) times daily as needed for anxiety.     . digoxin (LANOXIN) 0.125 MG tablet Take 1 tablet (0.125 mg total) by mouth daily. 30 tablet 3  . DULoxetine (CYMBALTA) 60 MG capsule Take 60 mg by mouth daily.    . Fluticasone-Salmeterol (ADVAIR DISKUS) 250-50 MCG/DOSE AEPB Inhale 1 puff into the lungs 2 (two) times daily. 180 each 4  . furosemide (LASIX) 20 MG tablet Take 1 tablet (20 mg total) by mouth every other day. 30 tablet 6  . loratadine (CLARITIN) 10 MG tablet Take 10 mg by mouth daily as needed for allergies.     . NON FORMULARY ALLERGY INJECTIONS.  2 shots every 2 weeks    . pantoprazole (PROTONIX) 40 MG tablet Take 40 mg by mouth 2 (two) times daily.     [name] 80 MCG/ACT AERS Place 1 spray into the nose daily as needed (allergic rhinitis).     . sacubitril-valsartan (ENTRESTO) 24-26 MG Take 1 tablet by mouth 2 (two) times daily. 60 tablet 6  . spironolactone (ALDACTONE) 25 MG tablet Take 0.5 tablets (12.5 mg total) by mouth at bedtime. 15 tablet 6  No current facility-administered medications for this encounter.   BP 104/70 mmHg  Pulse 80  Wt 140 lb (63.504 kg)  SpO2 99%  General: NAD. Ambulated in the clinic without difficulty.  Neck: No JVD, no thyromegaly or thyroid nodule.  Lungs: Clear to auscultation bilaterally with normal respiratory effort. CV: Nondisplaced PMI.  Heart regular S1/S2, no S3/S4, no murmur.  No peripheral edema.  No carotid bruit.  Normal pedal pulses.  Abdomen: Soft, nontender, no hepatosplenomegaly, no distention.  Skin: Intact without lesions or rashes.  Neurologic: Alert and oriented x 3.  Psych: Normal affect. Extremities: No clubbing or cyanosis.  HEENT: Normal.    Assessment/Plan: Chronic systolic CHF: Nonischemic cardiomyopathy.  Etiology uncertain: normal coronaries, no history of ETOH or drug abuse, no family history of cardiomyopathy.  No prior history of HTN.  Possible that it is related to viral myocarditis.   cMRI in 7/16 showed some improvement of EF to 31%.  Also showed LV dyssynchrony.  CPX in 9/16 showed a moderate functional limitation from CHF.  NYHA II. Volume status stable.  - Continue lasix 20 mg every other day, check BMET today.  - Continue coreg 12.5 mg in am and increase pm dose to 18.75 mg.  - Continue digoxin 0.125 mg daily, check digoxin level.   -Continue current dose of entresto.  - Start spironolactone 12.5 mg daily.  She was dizzy last time she tried this med, so I asked her to take it in the evening rather than in the morning. BMET to be repeated in 2 wks.  - Repeat echo in 12/16 for ICD.  Borderline candidate for CRT with QRS 134 msec on last ECG, but she has prominent septal-lateral dyssynchrony on imaging so may benefit.  -Continue cardiac rehab.   Follow up in 1 month for medication titration.   [name]  07/09/2015

## 2015-07-10 ENCOUNTER — Encounter (HOSPITAL_COMMUNITY)
Admission: RE | Admit: 2015-07-10 | Discharge: 2015-07-10 | Disposition: A | Discharge status: Home / Self Care | Payer: Medicaid Other | Source: Ambulatory Visit | Attending: Cardiovascular Disease | Admitting: Cardiovascular Disease

## 2015-07-10 DIAGNOSIS — I429 Cardiomyopathy, unspecified: Secondary | ICD-10-CM | POA: Diagnosis not present

## 2015-07-10 DIAGNOSIS — I5021 Acute systolic (congestive) heart failure: Secondary | ICD-10-CM | POA: Insufficient documentation

## 2015-07-11 ENCOUNTER — Telehealth (HOSPITAL_COMMUNITY): Payer: Self-pay

## 2015-07-11 NOTE — Telephone Encounter (Signed)
Faxed last visit to [facility] as requested [phone]

## 2015-07-12 ENCOUNTER — Encounter (HOSPITAL_COMMUNITY)
Admission: RE | Admit: 2015-07-12 | Discharge: 2015-07-12 | Disposition: A | Discharge status: Home / Self Care | Payer: Medicaid Other | Source: Ambulatory Visit | Attending: Cardiovascular Disease | Admitting: Cardiovascular Disease

## 2015-07-12 DIAGNOSIS — I5021 Acute systolic (congestive) heart failure: Secondary | ICD-10-CM | POA: Diagnosis not present

## 2015-07-15 ENCOUNTER — Encounter (HOSPITAL_COMMUNITY)
Admission: RE | Admit: 2015-07-15 | Discharge: 2015-07-15 | Disposition: A | Discharge status: Home / Self Care | Payer: Medicaid Other | Source: Ambulatory Visit | Attending: Cardiovascular Disease | Admitting: Cardiovascular Disease

## 2015-07-15 ENCOUNTER — Telehealth (HOSPITAL_COMMUNITY): Payer: Self-pay | Admitting: *Deleted

## 2015-07-15 DIAGNOSIS — I5021 Acute systolic (congestive) heart failure: Secondary | ICD-10-CM | POA: Diagnosis not present

## 2015-07-15 MED ORDER — DIGOXIN 125 MCG PO TABS: 0.0625 mg | ORAL_TABLET | Freq: Every day | ORAL | Status: DC

## 2015-07-15 NOTE — Telephone Encounter (Signed)
-----   Message from Larey Dresser, MD sent at 07/08/2015  1:53 PM EDT ----- Decrease digoxin to 0.0625 mg daily.  Digoxin level (trough) in 10 days.

## 2015-07-15 NOTE — Telephone Encounter (Signed)
Notes Recorded by Scarlette Calico, RN on 07/15/2015 at 12:26 PM Pt aware and agreeable, labs sch for 11/9 resch to 11/16 w/dig level

## 2015-07-17 ENCOUNTER — Other Ambulatory Visit (HOSPITAL_COMMUNITY): Payer: Self-pay

## 2015-07-17 ENCOUNTER — Encounter (HOSPITAL_COMMUNITY): Payer: Medicaid Other

## 2015-07-19 ENCOUNTER — Encounter (HOSPITAL_COMMUNITY)
Admission: RE | Admit: 2015-07-19 | Discharge: 2015-07-19 | Disposition: A | Discharge status: Home / Self Care | Payer: Medicaid Other | Source: Ambulatory Visit | Attending: Cardiovascular Disease | Admitting: Cardiovascular Disease

## 2015-07-19 DIAGNOSIS — I5021 Acute systolic (congestive) heart failure: Secondary | ICD-10-CM | POA: Diagnosis not present

## 2015-07-22 ENCOUNTER — Telehealth (HOSPITAL_COMMUNITY): Payer: Self-pay | Admitting: Pharmacist

## 2015-07-22 ENCOUNTER — Encounter (HOSPITAL_COMMUNITY)
Admission: RE | Admit: 2015-07-22 | Discharge: 2015-07-22 | Disposition: A | Discharge status: Home / Self Care | Payer: Medicaid Other | Source: Ambulatory Visit | Attending: Cardiovascular Disease | Admitting: Cardiovascular Disease

## 2015-07-22 DIAGNOSIS — I5021 Acute systolic (congestive) heart failure: Secondary | ICD-10-CM | POA: Diagnosis not present

## 2015-07-22 NOTE — Telephone Encounter (Signed)
Entresto 24-26 mg PA approved through Medicaid until 07/10/2016. Pharmacy aware and copay $3/mo.  Ruta Hinds. Velva Harman, PharmD, BCPS, CPP Clinical Pharmacist Pager: [phone] Phone: [phone] 07/22/2015 3:58 PM

## 2015-07-24 ENCOUNTER — Ambulatory Visit (HOSPITAL_COMMUNITY)
Admission: RE | Admit: 2015-07-24 | Discharge: 2015-07-24 | Disposition: A | Discharge status: Home / Self Care | Payer: Medicaid Other | Source: Ambulatory Visit | Attending: Cardiology | Admitting: Cardiology

## 2015-07-24 ENCOUNTER — Encounter (HOSPITAL_COMMUNITY)
Admission: RE | Admit: 2015-07-24 | Discharge: 2015-07-24 | Disposition: A | Discharge status: Home / Self Care | Payer: Medicaid Other | Source: Ambulatory Visit | Attending: Cardiovascular Disease | Admitting: Cardiovascular Disease

## 2015-07-24 DIAGNOSIS — I5021 Acute systolic (congestive) heart failure: Secondary | ICD-10-CM | POA: Diagnosis not present

## 2015-07-24 DIAGNOSIS — I5022 Chronic systolic (congestive) heart failure: Secondary | ICD-10-CM | POA: Insufficient documentation

## 2015-07-24 LAB — BASIC METABOLIC PANEL
Anion gap: 7 (ref 5–15)
BUN: 10 mg/dL (ref 6–20)
CO2: 28 mmol/L (ref 22–32)
Calcium: 9.3 mg/dL (ref 8.9–10.3)
Chloride: 103 mmol/L (ref 101–111)
Creatinine, Ser: 1.11 mg/dL — ABNORMAL HIGH (ref 0.44–1.00)
GFR calc Af Amer: >60 mL/min (ref >60)
GFR calc non Af Amer: 59 mL/min — ABNORMAL LOW (ref >60)
Glucose, Bld: 95 mg/dL (ref 65–99)
Potassium: 4 mmol/L (ref 3.5–5.1)
Sodium: 138 mmol/L (ref 135–145)

## 2015-07-24 LAB — DIGOXIN LEVEL: Digoxin Level: 0.5 ng/mL — ABNORMAL LOW (ref 0.8–2.0)

## 2015-07-26 ENCOUNTER — Encounter (HOSPITAL_COMMUNITY)
Admission: RE | Admit: 2015-07-26 | Discharge: 2015-07-26 | Disposition: A | Discharge status: Home / Self Care | Payer: Medicaid Other | Source: Ambulatory Visit | Attending: Cardiovascular Disease | Admitting: Cardiovascular Disease

## 2015-07-26 DIAGNOSIS — I5021 Acute systolic (congestive) heart failure: Secondary | ICD-10-CM | POA: Diagnosis not present

## 2015-07-29 ENCOUNTER — Encounter (HOSPITAL_COMMUNITY)
Admission: RE | Admit: 2015-07-29 | Discharge: 2015-07-29 | Disposition: A | Discharge status: Home / Self Care | Payer: Medicaid Other | Source: Ambulatory Visit | Attending: Cardiovascular Disease | Admitting: Cardiovascular Disease

## 2015-07-29 DIAGNOSIS — I5021 Acute systolic (congestive) heart failure: Secondary | ICD-10-CM | POA: Diagnosis not present

## 2015-08-05 NOTE — Progress Notes (Signed)
Cardiac Rehabilitation Program Outcomes Report   Orientation:  04/02/15 Graduate Date:  07/29/15 Discharge Date:  07/29/15 # of sessions completed: 36  Cardiologist: Bronson Ing Family MD:  Bland Span Time:  1100  A.  Exercise Program:  Tolerates exercise @ 3..9 METS for 15 minutes, Walk Test Results:  Post: 2.56 mets, Improved functional capacity  8.03 %, Improved  muscular strength  3.51 % and Improved  flexibility 4.91 %  B.  Mental Health:  Good mental attitude, Other significant stress:  other, Quality of Life (QOL)  changes:  Overall  -19.57 %, Health/Functioning -25.65 %, Socioeconomics -6.48 %, Psych/Spiritual -31.43 %, Family -1.04 %   and PHQ-9: 11.  patient states already receives counseling.   C.  Education/Instruction/Skills  Accurately checks own pulse.  Rest:  85  Exercise:  117, Knows THR for exercise and Uses Perceived Exertion Scale and/or Dyspnea Scale  Uses Perceived Exertion Scale and/or Dyspnea Scale  D.  Nutrition/Weight Control/Body Composition:  Adherence to prescribed nutrition program: fair  and Patient has gained 1.4 kg   E.  Blood Lipids   No results found for: CHOL, HDL, LDLCALC, LDLDIRECT, TRIG, CHOLHDL  F.  Lifestyle Changes:  Making positive lifestyle changes and Not smoking:  Quit never smoker  G.  Symptoms noted with exercise:  Asymptomatic  Report Completed By:  Stevphen Rochester RN   Comments:  This is the patients graduation progress note for [facility].  Patient progressed well in the program and plans to exercise at home.

## 2015-08-05 NOTE — Progress Notes (Signed)
Patient is discharged from [facility] program today, 07/29/15 with 36 sessions.  She achieved LTG of 30 minutes of aerobic exercise at max met level of 3.39.  All patient vitals are WNL.  Patient has not met with dietician.  Discharge instructions have been reviewed in detail and patient expressed an understanding of material given.  Patient plans to exercise at home. Cardiac Rehab will make 1 month, 6 month and 1 year call backs.  Patient had no complaints of any abnormal S/S or pain on their exit visit.  Patient finished post walk test.

## 2015-08-07 ENCOUNTER — Ambulatory Visit (HOSPITAL_COMMUNITY)
Admission: RE | Admit: 2015-08-07 | Discharge: 2015-08-07 | Disposition: A | Discharge status: Home / Self Care | Payer: Medicaid Other | Source: Ambulatory Visit | Attending: Cardiology | Admitting: Cardiology

## 2015-08-07 ENCOUNTER — Encounter (HOSPITAL_COMMUNITY): Payer: Self-pay | Admitting: *Deleted

## 2015-08-07 VITALS — BP 110/70 | HR 84 | Wt 141.0 lb

## 2015-08-07 DIAGNOSIS — D573 Sickle-cell trait: Secondary | ICD-10-CM | POA: Diagnosis not present

## 2015-08-07 DIAGNOSIS — I5022 Chronic systolic (congestive) heart failure: Secondary | ICD-10-CM | POA: Insufficient documentation

## 2015-08-07 DIAGNOSIS — K219 Gastro-esophageal reflux disease without esophagitis: Secondary | ICD-10-CM | POA: Insufficient documentation

## 2015-08-07 DIAGNOSIS — J45909 Unspecified asthma, uncomplicated: Secondary | ICD-10-CM | POA: Diagnosis not present

## 2015-08-07 DIAGNOSIS — I429 Cardiomyopathy, unspecified: Secondary | ICD-10-CM | POA: Insufficient documentation

## 2015-08-07 DIAGNOSIS — Z79899 Other long term (current) drug therapy: Secondary | ICD-10-CM | POA: Diagnosis not present

## 2015-08-07 MED ORDER — CARVEDILOL 12.5 MG PO TABS: 18.7500 mg | ORAL_TABLET | Freq: Two times a day (BID) | ORAL | Status: DC

## 2015-08-07 NOTE — Patient Instructions (Signed)
Increase Carvedilol to 18.75 mg (1 & 1/2 tabs) Twice daily   Your physician has requested that you have an echocardiogram. Echocardiography is a painless test that uses sound waves to create images of your heart. It provides your doctor with information about the size and shape of your heart and how well your heart's chambers and valves are working. This procedure takes approximately one hour. There are no restrictions for this procedure.  Your physician recommends that you schedule a follow-up appointment in: 2 months

## 2015-08-07 NOTE — Progress Notes (Signed)
Patient ID: [place], female   DOB: Feb 17, 1971, 44 y.o.   MRN: [id number] PCP: Dr. Gar Ponto HF Cardiology: [name]  44 yo presents for evaluation of nonischemic dilated cardiomyopathy.  She developed exertional dyspnea and orthopnea in 5/16.  She had had no prior known cardiac problems.  She was admitted in 6/16 with CHF.  Echo showed EF 20% with severe LV dilation.  Coronary angiography showed normal coronaries.  RHC showed preserved cardiac output.  She was diuresed and started on cardiac meds.    Today she returns for HF follow up. SOB with inclines and hills but otherwise ok.  Weight stable.  Taking Lasix every other day. Following low salt diet and limiting fluids.  No chest pain.  No orthopnea/PND.  Some generalized fatigue.  She has completed cardiac rehab.   Labs (6/16): K 4.5, creatinine 1.11, TSH normal Labs (7/16): K 3.8, creatinine 1.13, BNP 353, SPEP negative Labs (8/16): K 4.2, creatinine 1.11 Labs (05/22/2015): K 3.8 Creatinine 1.01  Labs (11/16): digoxin 0.5, K 4, creatinine 1.11  PMH: 1. GERD 2. Asthma 3. Sickle cell trait 4. H/o H pylori gastritis 5. Cardiomyopathy: Nonischemic.  Echo (6/16) with EF 20%, severe LV dilation, mild MR, PA systolic pressure 54 mmHg.  LHC/RHC (6/16) with EF 15%, CI 2.56 Fick/2.12 thermo, mean RA 1, PA 39/13 mean 26, mean PCWP 12, normal coronaries. cMRI (7/16) with moderately dilated LV with EF 31%, prominent LV septal-lateral dyssynchrony, normal RV size and systolic function, no myocardial LGE, so no definitive evidence for prior MI, Myocarditis, or infiltrative disease.  CPX (9/16) with peak VO2 15.9, VE/VCO2 slope 23.9, RER 1.09 => moderate functional impairment.   SH: Lives with son in [place].  Nonsmoker.  No ETOH or drugs. Works at [employer]  Divorced.   FH: No known heart disease.   ROS: All systems reviewed and negative except as per HPI.   Current Outpatient Prescriptions  Medication Sig Dispense Refill  . albuterol (PROVENTIL  HFA;VENTOLIN HFA) 108 (90 BASE) MCG/ACT inhaler Inhale into the lungs every 6 (six) hours as needed for wheezing or shortness of breath.    . carvedilol (COREG) 12.5 MG tablet Take 1.5 tablets (18.75 mg total) by mouth 2 (two) times daily with a meal. 90 tablet 6  . clonazePAM (KLONOPIN) 0.5 MG tablet Take 0.5 mg by mouth 2 (two) times daily as needed for anxiety.     . digoxin (LANOXIN) 0.125 MG tablet Take 0.5 tablets (0.0625 mg total) by mouth daily. 30 tablet 3  . DULoxetine (CYMBALTA) 60 MG capsule Take 60 mg by mouth daily.    . Fluticasone-Salmeterol (ADVAIR DISKUS) 250-50 MCG/DOSE AEPB Inhale 1 puff into the lungs 2 (two) times daily. 180 each 4  . furosemide (LASIX) 20 MG tablet Take 1 tablet (20 mg total) by mouth every other day. 30 tablet 6  . loratadine (CLARITIN) 10 MG tablet Take 10 mg by mouth daily as needed for allergies.     . NON FORMULARY ALLERGY INJECTIONS.  2 shots every 2 weeks    . pantoprazole (PROTONIX) 40 MG tablet Take 40 mg by mouth 2 (two) times daily.     [name] 80 MCG/ACT AERS Place 1 spray into the nose daily as needed (allergic rhinitis).     . sacubitril-valsartan (ENTRESTO) 24-26 MG Take 1 tablet by mouth 2 (two) times daily. 60 tablet 6  . spironolactone (ALDACTONE) 25 MG tablet Take 0.5 tablets (12.5 mg total) by mouth at bedtime. 15 tablet 6  No current facility-administered medications for this encounter.   BP 110/70 mmHg  Pulse 84  Wt 141 lb (63.957 kg)  SpO2 96%  General: NAD. Ambulated in the clinic without difficulty.  Neck: No JVD, no thyromegaly or thyroid nodule.  Lungs: Clear to auscultation bilaterally with normal respiratory effort. CV: Nondisplaced PMI.  Heart regular S1/S2, no S3/S4, no murmur.  No peripheral edema.  No carotid bruit.  Normal pedal pulses.  Abdomen: Soft, nontender, no hepatosplenomegaly, no distention.  Skin: Intact without lesions or rashes.  Neurologic: Alert and oriented x 3.  Psych: Normal affect. Extremities: No  clubbing or cyanosis.  HEENT: Normal.   Assessment/Plan: Chronic systolic CHF: Nonischemic cardiomyopathy.  Etiology uncertain: normal coronaries, no history of ETOH or drug abuse, no family history of cardiomyopathy.  No prior history of HTN.  Possible that it is related to viral myocarditis.   cMRI in 7/16 showed some improvement of EF to 31%.  Also showed LV dyssynchrony.  CPX in 9/16 showed a moderate functional limitation from CHF.  NYHA II. Volume status stable.  - Continue lasix 20 mg every other day.  - Increase Coreg to 18.75 mg bid. - Continue digoxin 0.125 mg daily, recent digoxin level ok.   -Continue current dose of Entresto.  - Continue spironolactone.   - Repeat echo in 12/16 for ICD.  Borderline candidate for CRT with QRS 134 msec on last ECG, but she has prominent septal-lateral dyssynchrony on imaging so may benefit.   Followup in 2 months.  [name]  08/07/2015

## 2015-08-20 ENCOUNTER — Ambulatory Visit: Payer: BC Managed Care – PPO | Admitting: Nurse Practitioner

## 2015-08-27 ENCOUNTER — Encounter (HOSPITAL_COMMUNITY): Payer: Self-pay

## 2015-08-27 NOTE — Progress Notes (Signed)
[facility] Faxed form for medical record request 02/2014-present. All available records by our office for this time period faxed to provided # [phone] Case # [id number] Copy of record request scanned into patient's electronic medical record.  Renee Pain

## 2015-08-28 ENCOUNTER — Other Ambulatory Visit: Payer: Self-pay | Admitting: Internal Medicine

## 2015-09-06 ENCOUNTER — Other Ambulatory Visit: Payer: Self-pay

## 2015-09-06 ENCOUNTER — Ambulatory Visit (HOSPITAL_COMMUNITY): Payer: Medicaid Other | Attending: Cardiology

## 2015-09-06 DIAGNOSIS — I071 Rheumatic tricuspid insufficiency: Secondary | ICD-10-CM | POA: Diagnosis not present

## 2015-09-06 DIAGNOSIS — I5022 Chronic systolic (congestive) heart failure: Secondary | ICD-10-CM | POA: Insufficient documentation

## 2015-09-06 DIAGNOSIS — I371 Nonrheumatic pulmonary valve insufficiency: Secondary | ICD-10-CM | POA: Diagnosis not present

## 2015-09-06 DIAGNOSIS — I509 Heart failure, unspecified: Secondary | ICD-10-CM | POA: Diagnosis present

## 2015-09-06 DIAGNOSIS — I517 Cardiomegaly: Secondary | ICD-10-CM | POA: Insufficient documentation

## 2015-09-11 ENCOUNTER — Telehealth (HOSPITAL_COMMUNITY): Payer: Self-pay | Admitting: *Deleted

## 2015-09-11 DIAGNOSIS — I5022 Chronic systolic (congestive) heart failure: Secondary | ICD-10-CM

## 2015-09-11 NOTE — Telephone Encounter (Signed)
Notes Recorded by Scarlette Calico, RN on 09/11/2015 at 1:54 PM Pt aware and agreeable, referral placed ------  Notes Recorded by Larey Dresser, MD on 09/09/2015 at 10:41 AM Would refer to EP for ICD, and ?CRT (borderline QRS but septal-lateral dyssynchrony on echo).

## 2015-09-11 NOTE — Telephone Encounter (Signed)
-----   Message from Larey Dresser, MD sent at 09/09/2015 10:40 AM EST ----- EF 30%, remains low.

## 2015-09-20 NOTE — Progress Notes (Signed)
ELECTROPHYSIOLOGY CONSULT NOTE  Patient ID: Mary Pratt, MRN: [id number], DOB/AGE: 47-Nov-1972 47 y.o. Admit date: (Not on file) Date of Consult: 09/23/2015  Primary Physician: Gar Ponto, MD Primary Cardiologist: DM  Consulting Physician [place]   Chief Complaint: ICD   HPI Mary Pratt is a 45 y.o. female  Referred for consideration of ICD   May 2016 she  presented with CHF and found to have  a nonischemic cardiomyopathy    EF 20% with severe dilitation; RHC/LHC 6/16  MRI 7/16 demonstrated EF 30% with no LGE  Repeat echo 12/16 EF 30%  ECG 9/16 also demonstrated LBBB QRS 156   She has moderate exercise intolerance. She has significant fatigue. She has mild shortness of breath walking up inclines.  She denies edema orthopnea or nocturnal dyspnea.  She's had some palpitations but no syncope.  No family history cardiomyopathic disease  Past medical history is also notable for sickle trait    Past Medical History  Diagnosis Date  . GERD with stricture     dilated 2010  . Asthma   . Functional dyspepsia     early satiety   . 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 Sickle cell trait ([place])   . HELICOBACTER PYLORI GASTRITIS 01/15/2009    dyspnea, ? reaction to Biaxin/amoxicillin  Pylera 01/15/09 - completed therapy     . Anxiety   . Chronic systolic CHF (congestive heart failure) ([place])     a. 02/2015 Echo: EF 20%, sev dil w/ diff HK, worse @ inf base. Tiv AI, mild MR, mod dil LA, PASP 53mmHg.  . Situational depression     "son died"  . NICM (nonischemic cardiomyopathy) ([place])     a. 02/2015 Cath: nl cors, EF 15%, mild PAH;  b. 02/2015 Enrolled in VEST trial.      Surgical History:  Past Surgical History  Procedure Laterality Date  . Wisdom tooth extraction    . Esophagogastroduodenoscopy (egd) with esophageal dilation  11/30/2008    esophageal ring dilated 25 French, H. pylori gastritis  . Colposcopy  2013    neg  . Tubal ligation  1991  . Cardiac  catheterization N/A 02/26/2015    Procedure: Right/Left Heart Cath and Coronary Angiography;  Surgeon: Troy Sine, MD;  Location: [facility];  Service: Cardiovascular;  Laterality: N/A;     Home Meds: Prior to Admission medications   Medication Sig Start Date End Date Taking? Authorizing Provider  albuterol (PROVENTIL HFA;VENTOLIN HFA) 108 (90 BASE) MCG/ACT inhaler Inhale into the lungs every 6 (six) hours as needed for wheezing or shortness of breath.    Historical Provider, MD  carvedilol (COREG) 12.5 MG tablet Take 1.5 tablets (18.75 mg total) by mouth 2 (two) times daily with a meal. 08/07/15   Larey Dresser, MD  clonazePAM (KLONOPIN) 0.5 MG tablet Take 0.5 mg by mouth 2 (two) times daily as needed for anxiety.     Historical Provider, MD  digoxin (LANOXIN) 0.125 MG tablet TAKE ONE TABLET BY MOUTH ONCE DAILY 08/30/15   Jolaine Artist, MD  DULoxetine (CYMBALTA) 60 MG capsule Take 60 mg by mouth daily.    Historical Provider, MD  Fluticasone-Salmeterol (ADVAIR DISKUS) 250-50 MCG/DOSE AEPB Inhale 1 puff into the lungs 2 (two) times daily. 03/23/13   Kathee Delton, MD  furosemide (LASIX) 20 MG tablet Take 1 tablet (20 mg total) by mouth every other day. 04/11/15  Jolaine Artist, MD  loratadine (CLARITIN) 10 MG tablet Take 10 mg by mouth daily as needed for allergies.     Historical Provider, MD  NON FORMULARY ALLERGY INJECTIONS.  2 shots every 2 weeks    Historical Provider, MD  pantoprazole (PROTONIX) 40 MG tablet Take 40 mg by mouth 2 (two) times daily.  01/31/15   Historical Provider, MD  QNASL 80 MCG/ACT AERS Place 1 spray into the nose daily as needed (allergic rhinitis).  09/18/14   Historical Provider, MD  sacubitril-valsartan (ENTRESTO) 24-26 MG Take 1 tablet by mouth 2 (two) times daily. 02/28/15   Rogelia Mire, NP  spironolactone (ALDACTONE) 25 MG tablet Take 0.5 tablets (12.5 mg total) by mouth at bedtime. 07/08/15   Larey Dresser, MD    Allergies:    Allergies  Allergen Reactions  . Amoxicillin     REACTION: Tacycardia  . Clarithromycin Hives    Breaks out and gets whelps    Social History   Social History  . Marital Status: Divorced    Spouse Name: N/A  . Number of Children: 3  . Years of Education: N/A   Occupational History  . texturing operator    Social History Main Topics  . Smoking status: Never Smoker   . Smokeless tobacco: Never Used  . Alcohol Use: No  . Drug Use: No  . Sexual Activity:    Partners: Male    Birth Control/ Protection: Surgical     Comment: BTL   Other Topics Concern  . Not on file   Social History Narrative   Daily caffeine use - <1   2 sons 1 daughter 1 son deceased   Single        Family History  Problem Relation Age of Onset  . Colon cancer Neg Hx   . Diabetes Mother   . Hypertension Mother   . Hypothyroidism Mother   . Breast cancer Maternal Aunt   . Brain cancer Maternal Uncle   . Lung cancer Maternal Grandmother   . Colon polyps Neg Hx   . Esophageal cancer Neg Hx      ROS:  Please see the history of present illness.     All other systems reviewed and negative.    Physical Exam: Blood pressure 100/76, pulse 86, height 5\' 1"  (1.549 m), weight 144 lb (65.318 kg). General: Well developed, well nourished female in no acute distress. Head: Normocephalic, atraumatic, sclera non-icteric, no xanthomas, nares are without discharge. EENT: normal  Lymph Nodes:  none Neck: Negative for carotid bruits. JVD 6 Back:without scoliosis kyphosis Lungs: Clear bilaterally to auscultation without wheezes, rales, or rhonchi. Breathing is unlabored. Heart: RRR with S1 S2.  2/6 systolic  murmur . No rubs, or gallops appreciated. Abdomen: Soft, non-tender, non-distended with normoactive bowel sounds. No hepatomegaly. No rebound/guarding. No obvious abdominal masses. Msk:  Strength and tone appear normal for age. Extremities: No clubbing or cyanosis. No edema.  Distal pedal pulses are 2+  and equal bilaterally. Skin: Warm and Dry Neuro: Alert and oriented X 3. CN III-XII intact Grossly normal sensory and motor function . Psych:  Responds to questions appropriately with a normal affect.      Labs: Cardiac Enzymes No results for input(s): CKTOTAL, CKMB, TROPONINI in the last 72 hours. CBC Lab Results  Component Value Date   WBC 4.5 05/22/2015   HGB 12.1 05/22/2015   HCT 35.3* 05/22/2015   MCV 83.5 05/22/2015   PLT 232 05/22/2015   PROTIME:  No results for input(s): LABPROT, INR in the last 72 hours. Chemistry No results for input(s): NA, K, CL, CO2, BUN, CREATININE, CALCIUM, PROT, BILITOT, ALKPHOS, ALT, AST, GLUCOSE in the last 168 hours.  Invalid input(s): LABALBU Lipids No results found for: CHOL, HDL, LDLCALC, TRIG BNP PRO B NATRIURETIC PEPTIDE (BNP)  Date/Time Value Ref Range Status  02/18/2015 09:33 AM 1341.0* 0.0 - 100.0 pg/mL Final   Thyroid Function Tests: No results for input(s): TSH, T4TOTAL, T3FREE, THYROIDAB in the last 72 hours.  Invalid input(s): FREET3 Miscellaneous Lab Results  Component Value Date   DDIMER * 12/08/2008    2.46        AT THE INHOUSE ESTABLISHED CUTOFF VALUE OF 0.48 ug/mL FEU, THIS ASSAY HAS BEEN DOCUMENTED IN THE LITERATURE TO HAVE A SENSITIVITY AND NEGATIVE PREDICTIVE VALUE OF AT LEAST 98 TO 99%.  THE TEST RESULT SHOULD BE CORRELATED WITH AN ASSESSMENT OF THE CLINICAL PROBABILITY OF DVT / VTE.    Radiology/Studies:  No results found.  EKG: *Sinus rhythm at 86 Interval 17/15/40   Assessment and Plan:  Nonischemic cardiomyopathy  Congestive heart failure-chronic-systolic-grade 3  Left bundle branch block    The patient has persistent symptoms of heart failure characterized by both dyspnea as well as fatigue for nonischemic cardiomyopathy in the company left bundle branch block. She is on guideline directed medical therapy with up titration limited by hypotension. It is appropriate to consider CRT-D  implantation for both primary  prevention as well as the possibility of functional improvement in the context of her left bundle branch block  Have reviewed the potential benefits and risks of ICD implantation including but not limited to death, perforation of heart or lung, lead dislodgement, infection,  device malfunction and inappropriate shocks  As well as non response The patient and family express understanding  and are willing to proceed.  I have also spoke with Dr. DM regarding the use of Ivabradine given her sinus tachycardia and her ongoing symptoms. We'll start her on 2.5 mg      Virl Axe

## 2015-09-23 ENCOUNTER — Ambulatory Visit (INDEPENDENT_AMBULATORY_CARE_PROVIDER_SITE_OTHER): Payer: BLUE CROSS/BLUE SHIELD | Admitting: Internal Medicine

## 2015-09-23 ENCOUNTER — Encounter: Payer: Self-pay | Admitting: *Deleted

## 2015-09-23 ENCOUNTER — Encounter: Payer: Self-pay | Admitting: Internal Medicine

## 2015-09-23 VITALS — BP 100/76 | HR 86 | Ht 61.0 in | Wt 144.0 lb

## 2015-09-23 DIAGNOSIS — Z01812 Encounter for preprocedural laboratory examination: Secondary | ICD-10-CM | POA: Diagnosis not present

## 2015-09-23 DIAGNOSIS — I509 Heart failure, unspecified: Secondary | ICD-10-CM | POA: Diagnosis not present

## 2015-09-23 DIAGNOSIS — I429 Cardiomyopathy, unspecified: Secondary | ICD-10-CM | POA: Diagnosis not present

## 2015-09-23 DIAGNOSIS — I428 Other cardiomyopathies: Secondary | ICD-10-CM

## 2015-09-23 NOTE — Patient Instructions (Signed)
Medication Instructions: - no changes  Labwork: - Your physician recommends that you return for lab work: 10/07/15- BMP/ CBC/ INR  Procedures/Testing: - Your physician has recommended that you have a defibrillator inserted. An implantable cardioverter defibrillator (ICD) is a small device that is placed in your chest or, in rare cases, your abdomen. This device uses electrical pulses or shocks to help control life-threatening, irregular heartbeats that could lead the heart to suddenly stop beating (sudden cardiac arrest). Leads are attached to the ICD that goes into your heart. This is done in the hospital and usually requires an overnight stay. Please see the instruction sheet given to you today for more information.  Follow-Up: - Your physician recommends that you schedule a follow-up appointment in: 10 days (from 10/14/15) for a wound check with the device clinic)  - Your physician recommends that you schedule a follow-up appointment in: 3 months (from 10/14/15) with Dr. Caryl Comes.  Any Additional Special Instructions Will Be Listed Below (If Applicable).

## 2015-10-04 DIAGNOSIS — Z736 Limitation of activities due to disability: Secondary | ICD-10-CM

## 2015-10-07 ENCOUNTER — Ambulatory Visit (HOSPITAL_COMMUNITY)
Admission: RE | Admit: 2015-10-07 | Discharge: 2015-10-07 | Disposition: A | Discharge status: Home / Self Care | Payer: BLUE CROSS/BLUE SHIELD | Source: Ambulatory Visit | Attending: Cardiology | Admitting: Cardiology

## 2015-10-07 ENCOUNTER — Other Ambulatory Visit: Payer: BLUE CROSS/BLUE SHIELD

## 2015-10-07 ENCOUNTER — Encounter (HOSPITAL_COMMUNITY): Payer: Self-pay

## 2015-10-07 ENCOUNTER — Other Ambulatory Visit: Payer: Self-pay | Admitting: Internal Medicine

## 2015-10-07 ENCOUNTER — Encounter (HOSPITAL_COMMUNITY): Payer: Self-pay | Admitting: *Deleted

## 2015-10-07 VITALS — BP 102/62 | HR 78 | Wt 143.2 lb

## 2015-10-07 DIAGNOSIS — J45909 Unspecified asthma, uncomplicated: Secondary | ICD-10-CM | POA: Diagnosis not present

## 2015-10-07 DIAGNOSIS — I5022 Chronic systolic (congestive) heart failure: Secondary | ICD-10-CM | POA: Insufficient documentation

## 2015-10-07 DIAGNOSIS — D573 Sickle-cell trait: Secondary | ICD-10-CM | POA: Diagnosis not present

## 2015-10-07 DIAGNOSIS — Z79899 Other long term (current) drug therapy: Secondary | ICD-10-CM | POA: Diagnosis not present

## 2015-10-07 DIAGNOSIS — I428 Other cardiomyopathies: Secondary | ICD-10-CM | POA: Insufficient documentation

## 2015-10-07 DIAGNOSIS — I447 Left bundle-branch block, unspecified: Secondary | ICD-10-CM | POA: Insufficient documentation

## 2015-10-07 DIAGNOSIS — K219 Gastro-esophageal reflux disease without esophagitis: Secondary | ICD-10-CM | POA: Diagnosis not present

## 2015-10-07 LAB — BASIC METABOLIC PANEL
Anion gap: 11 (ref 5–15)
BUN: 9 mg/dL (ref 6–20)
CO2: 25 mmol/L (ref 22–32)
Calcium: 9.3 mg/dL (ref 8.9–10.3)
Chloride: 103 mmol/L (ref 101–111)
Creatinine, Ser: 1.11 mg/dL — ABNORMAL HIGH (ref 0.44–1.00)
GFR calc Af Amer: >60 mL/min (ref >60)
GFR calc non Af Amer: 59 mL/min — ABNORMAL LOW (ref >60)
Glucose, Bld: 103 mg/dL — ABNORMAL HIGH (ref 65–99)
Potassium: 4.3 mmol/L (ref 3.5–5.1)
Sodium: 139 mmol/L (ref 135–145)

## 2015-10-07 LAB — CBC
HCT: 35.2 % — ABNORMAL LOW (ref 36.0–46.0)
Hemoglobin: 12 g/dL (ref 12.0–15.0)
MCH: 29.4 pg (ref 26.0–34.0)
MCHC: 34.1 g/dL (ref 30.0–36.0)
MCV: 86.3 fL (ref 78.0–100.0)
Platelets: 229 10*3/uL (ref 150–400)
RBC: 4.08 MIL/uL (ref 3.87–5.11)
RDW: 13.5 % (ref 11.5–15.5)
WBC: 6.5 10*3/uL (ref 4.0–10.5)

## 2015-10-07 LAB — PROTIME-INR
INR: 0.96 (ref 0.00–1.49)
Prothrombin Time: 13 seconds (ref 11.6–15.2)

## 2015-10-07 MED ORDER — SACUBITRIL-VALSARTAN 24-26 MG PO TABS: 1.0000 | ORAL_TABLET | Freq: Two times a day (BID) | ORAL | Status: DC

## 2015-10-07 MED ORDER — SPIRONOLACTONE 25 MG PO TABS: 25.0000 mg | ORAL_TABLET | Freq: Every day | ORAL | Status: DC

## 2015-10-07 MED FILL — ENTRESTO 24 MG-26 MG TABLET: 24-26 | 30 days supply | Qty: 60 | Fill #5

## 2015-10-07 NOTE — Patient Instructions (Signed)
Increase Spironolactone 25 mg (1 tab) daily  Labs today  Labs in 2 weeks  Your physician recommends that you schedule a follow-up appointment in: 2 months

## 2015-10-08 NOTE — Progress Notes (Signed)
Patient ID: Mary Pratt, female   DOB: [birth year], 45 y.o.   MRN: [id number] PCP: Dr. Gar Ponto HF Cardiology: [name]  45 yo presents for evaluation of nonischemic dilated cardiomyopathy.  She developed exertional dyspnea and orthopnea in 5/16.  She had had no prior known cardiac problems.  She was admitted in 6/16 with CHF.  Echo showed EF 20% with severe LV dilation.  Coronary angiography showed normal coronaries.  RHC showed preserved cardiac output.  She was diuresed and started on cardiac meds. Repeat echo in 12/16 showed EF 30% with septal-lateral dyssynchrony.    Today she returns for HF follow up. SOB with inclines and hills but otherwise ok.  She can walk on flat ground for 15-20 minutes without problems.  Taking Lasix every other day. Following low salt diet and limiting fluids.  No chest pain.  No orthopnea/PND.  Some generalized fatigue.  She has completed cardiac rehab. She has seen Dr Caryl Comes and planned for CRT-D implantation.   Labs (6/16): K 4.5, creatinine 1.11, TSH normal Labs (7/16): K 3.8, creatinine 1.13, BNP 353, SPEP negative Labs (8/16): K 4.2, creatinine 1.11 Labs (05/22/2015): K 3.8 Creatinine 1.01  Labs (11/16): digoxin 0.5, K 4, creatinine 1.11 Labs (12/16): K 4, creatinine 1.11, digoxin 0.5  ECG: NSR, LBBB with QRS 144 msec  PMH: 1. GERD 2. Asthma 3. Sickle cell trait 4. H/o H pylori gastritis 5. Cardiomyopathy: Nonischemic.  Echo (6/16) with EF 20%, severe LV dilation, mild MR, PA systolic pressure 54 mmHg.  LHC/RHC (6/16) with EF 15%, CI 2.56 Fick/2.12 thermo, mean RA 1, PA 39/13 mean 26, mean PCWP 12, normal coronaries. cMRI (7/16) with moderately dilated LV with EF 31%, prominent LV septal-lateral dyssynchrony, normal RV size and systolic function, no myocardial LGE, so no definitive evidence for prior MI, Myocarditis, or infiltrative disease.  CPX (9/16) with peak VO2 15.9, VE/VCO2 slope 23.9, RER 1.09 => moderate functional impairment.  Echo (12/16) with EF  30%, moderate LVH, septal-lateral dyssynchrony, normal RV size and systolic function.   SH: Lives with son in [place].  Nonsmoker.  No ETOH or drugs. Works at [employer]  Divorced.   FH: No known heart disease.   ROS: All systems reviewed and negative except as per HPI.   Current Outpatient Prescriptions  Medication Sig Dispense Refill  . albuterol (PROVENTIL HFA;VENTOLIN HFA) 108 (90 BASE) MCG/ACT inhaler Inhale into the lungs every 6 (six) hours as needed for wheezing or shortness of breath.    . carvedilol (COREG) 12.5 MG tablet Take 1.5 tablets (18.75 mg total) by mouth 2 (two) times daily with a meal. 90 tablet 6  . clonazePAM (KLONOPIN) 0.5 MG tablet Take 0.5 mg by mouth 2 (two) times daily as needed for anxiety.     . digoxin (LANOXIN) 0.125 MG tablet TAKE ONE TABLET BY MOUTH ONCE DAILY 30 tablet 0  . DULoxetine (CYMBALTA) 60 MG capsule Take 60 mg by mouth daily.    . fexofenadine (ALLEGRA) 180 MG tablet Take 180 mg by mouth daily.    . Fluticasone-Salmeterol (ADVAIR DISKUS) 250-50 MCG/DOSE AEPB Inhale 1 puff into the lungs 2 (two) times daily. 180 each 4  . furosemide (LASIX) 20 MG tablet Take 1 tablet (20 mg total) by mouth every other day. 30 tablet 6  . pantoprazole (PROTONIX) 40 MG tablet Take 40 mg by mouth 2 (two) times daily.     [name] 80 MCG/ACT AERS Place 1 spray into the nose daily as needed (allergic rhinitis).     Marland Kitchen  sacubitril-valsartan (ENTRESTO) 24-26 MG Take 1 tablet by mouth 2 (two) times daily. 60 tablet 6  . spironolactone (ALDACTONE) 25 MG tablet Take 1 tablet (25 mg total) by mouth at bedtime. 30 tablet 6   No current facility-administered medications for this encounter.   BP 102/62 mmHg  Pulse 78  Wt 143 lb 4 oz (64.978 kg)  SpO2 98%  General: NAD. Ambulated in the clinic without difficulty.  Neck: No JVD, no thyromegaly or thyroid nodule.  Lungs: Clear to auscultation bilaterally with normal respiratory effort. CV: Nondisplaced PMI.  Heart regular S1/S2, no  S3/S4, no murmur.  No peripheral edema.  No carotid bruit.  Normal pedal pulses.  Abdomen: Soft, nontender, no hepatosplenomegaly, no distention.  Skin: Intact without lesions or rashes.  Neurologic: Alert and oriented x 3.  Psych: Normal affect. Extremities: No clubbing or cyanosis.  HEENT: Normal.   Assessment/Plan: Chronic systolic CHF: Nonischemic cardiomyopathy.  Etiology uncertain: normal coronaries, no history of ETOH or drug abuse, no family history of cardiomyopathy.  No prior history of HTN.  Possible that it is related to viral myocarditis.   cMRI in 7/16 showed some improvement of EF to 31%.  Also showed LV dyssynchrony.  CPX in 9/16 showed a moderate functional limitation from CHF.  NYHA II. Volume status stable. Most recent echo in 12/16 with EF 30%, septal-lateral dyssynchrony.  - Continue lasix 20 mg every other day.  - Continue Coreg 18.75 mg bid. - Continue digoxin 0.125 mg daily, check level.  -Continue current dose of Entresto.  - Increase spironolactone to 25 mg daily.  BMET today and in 2 wks.   - LBBB with persistent low EF => plan for CRT-D with Dr Caryl Comes soon.   Followup in 2 months.  [name]  10/08/2015

## 2015-10-14 ENCOUNTER — Encounter (HOSPITAL_COMMUNITY)
Admission: RE | Disposition: A | Discharge status: Home / Self Care | Payer: Self-pay | Source: Ambulatory Visit | Attending: Internal Medicine

## 2015-10-14 ENCOUNTER — Ambulatory Visit (HOSPITAL_COMMUNITY)
Admission: RE | Admit: 2015-10-14 | Discharge: 2015-10-15 | Disposition: A | Discharge status: Home / Self Care | Payer: BLUE CROSS/BLUE SHIELD | Source: Ambulatory Visit | Attending: Internal Medicine | Admitting: Internal Medicine

## 2015-10-14 ENCOUNTER — Encounter (HOSPITAL_COMMUNITY): Payer: Self-pay | Admitting: Internal Medicine

## 2015-10-14 DIAGNOSIS — I509 Heart failure, unspecified: Secondary | ICD-10-CM

## 2015-10-14 DIAGNOSIS — I447 Left bundle-branch block, unspecified: Secondary | ICD-10-CM | POA: Insufficient documentation

## 2015-10-14 DIAGNOSIS — D573 Sickle-cell trait: Secondary | ICD-10-CM | POA: Diagnosis not present

## 2015-10-14 DIAGNOSIS — I429 Cardiomyopathy, unspecified: Secondary | ICD-10-CM | POA: Diagnosis not present

## 2015-10-14 DIAGNOSIS — Z959 Presence of cardiac and vascular implant and graft, unspecified: Secondary | ICD-10-CM

## 2015-10-14 DIAGNOSIS — J45909 Unspecified asthma, uncomplicated: Secondary | ICD-10-CM | POA: Diagnosis not present

## 2015-10-14 DIAGNOSIS — Z880 Allergy status to penicillin: Secondary | ICD-10-CM | POA: Diagnosis not present

## 2015-10-14 DIAGNOSIS — I428 Other cardiomyopathies: Secondary | ICD-10-CM

## 2015-10-14 DIAGNOSIS — K219 Gastro-esophageal reflux disease without esophagitis: Secondary | ICD-10-CM | POA: Insufficient documentation

## 2015-10-14 DIAGNOSIS — Z01812 Encounter for preprocedural laboratory examination: Secondary | ICD-10-CM

## 2015-10-14 DIAGNOSIS — I5022 Chronic systolic (congestive) heart failure: Secondary | ICD-10-CM | POA: Insufficient documentation

## 2015-10-14 HISTORY — PX: EP IMPLANTABLE DEVICE: SHX172B

## 2015-10-14 LAB — SURGICAL PCR SCREEN
MRSA, PCR: NEGATIVE
Staphylococcus aureus: NEGATIVE

## 2015-10-14 SURGERY — BIV ICD INSERTION CRT-D

## 2015-10-14 MED ORDER — CARVEDILOL 6.25 MG PO TABS
18.7500 mg | ORAL_TABLET | Freq: Two times a day (BID) | ORAL | Status: DC
  Administered 2015-10-14 – 2015-10-15 (×3): 18.75 mg via ORAL
  Filled 2015-10-14 (×3): qty 1

## 2015-10-14 MED ORDER — MIDAZOLAM HCL 5 MG/5ML IJ SOLN
INTRAMUSCULAR | Status: DC | PRN
  Administered 2015-10-14 (×4): 1 mg via INTRAVENOUS
  Administered 2015-10-14: 2 mg via INTRAVENOUS

## 2015-10-14 MED ORDER — SODIUM CHLORIDE 0.9 % IR SOLN
80.0000 mg | Status: AC
  Administered 2015-10-14: 80 mg

## 2015-10-14 MED ORDER — MIDAZOLAM HCL 5 MG/5ML IJ SOLN
INTRAMUSCULAR | Status: AC
  Filled 2015-10-14: qty 5

## 2015-10-14 MED ORDER — IOHEXOL 350 MG/ML SOLN
INTRAVENOUS | Status: DC | PRN
  Administered 2015-10-14: 4 mL via INTRACARDIAC

## 2015-10-14 MED ORDER — SODIUM CHLORIDE 0.9 % IV SOLN
INTRAVENOUS | Status: DC
  Administered 2015-10-14: 06:00:00 via INTRAVENOUS

## 2015-10-14 MED ORDER — ONDANSETRON HCL 4 MG/2ML IJ SOLN: 4.0000 mg | Freq: Four times a day (QID) | INTRAMUSCULAR | Status: DC | PRN

## 2015-10-14 MED ORDER — SODIUM CHLORIDE 0.9 % IR SOLN
Status: AC
  Filled 2015-10-14: qty 2

## 2015-10-14 MED ORDER — TRAZODONE HCL 50 MG PO TABS: 50.0000 mg | ORAL_TABLET | Freq: Every evening | ORAL | Status: DC | PRN

## 2015-10-14 MED ORDER — LIDOCAINE HCL (PF) 1 % IJ SOLN
INTRAMUSCULAR | Status: DC | PRN
  Administered 2015-10-14: 51 mL

## 2015-10-14 MED ORDER — HYDROCODONE-ACETAMINOPHEN 5-325 MG PO TABS
1.0000 | ORAL_TABLET | Freq: Four times a day (QID) | ORAL | Status: DC | PRN
  Administered 2015-10-14: 2 via ORAL
  Administered 2015-10-14: 1 via ORAL
  Administered 2015-10-15: 2 via ORAL
  Filled 2015-10-14: qty 1
  Filled 2015-10-14 (×2): qty 2

## 2015-10-14 MED ORDER — DULOXETINE HCL 60 MG PO CPEP
60.0000 mg | ORAL_CAPSULE | Freq: Two times a day (BID) | ORAL | Status: DC
  Administered 2015-10-14 – 2015-10-15 (×3): 60 mg via ORAL
  Filled 2015-10-14 (×3): qty 1

## 2015-10-14 MED ORDER — VANCOMYCIN HCL IN DEXTROSE 1-5 GM/200ML-% IV SOLN
1000.0000 mg | Freq: Two times a day (BID) | INTRAVENOUS | Status: AC
  Administered 2015-10-14: 1000 mg via INTRAVENOUS
  Filled 2015-10-14: qty 200

## 2015-10-14 MED ORDER — VANCOMYCIN HCL IN DEXTROSE 1-5 GM/200ML-% IV SOLN
1000.0000 mg | INTRAVENOUS | Status: AC
  Administered 2015-10-14: 1000 mg via INTRAVENOUS
  Filled 2015-10-14: qty 200

## 2015-10-14 MED ORDER — SACUBITRIL-VALSARTAN 24-26 MG PO TABS
1.0000 | ORAL_TABLET | Freq: Two times a day (BID) | ORAL | Status: DC
  Administered 2015-10-14 – 2015-10-15 (×3): 1 via ORAL
  Filled 2015-10-14 (×4): qty 1

## 2015-10-14 MED ORDER — DIGOXIN 125 MCG PO TABS
125.0000 ug | ORAL_TABLET | Freq: Every day | ORAL | Status: DC
  Administered 2015-10-14 – 2015-10-15 (×2): 125 ug via ORAL
  Filled 2015-10-14 (×2): qty 1

## 2015-10-14 MED ORDER — CLONAZEPAM 0.5 MG PO TABS: 0.5000 mg | ORAL_TABLET | Freq: Two times a day (BID) | ORAL | Status: DC | PRN

## 2015-10-14 MED ORDER — HEPARIN (PORCINE) IN NACL 2-0.9 UNIT/ML-% IJ SOLN
INTRAMUSCULAR | Status: AC
  Filled 2015-10-14: qty 500

## 2015-10-14 MED ORDER — ALBUTEROL SULFATE (2.5 MG/3ML) 0.083% IN NEBU: 2.5000 mg | INHALATION_SOLUTION | Freq: Four times a day (QID) | RESPIRATORY_TRACT | Status: DC | PRN

## 2015-10-14 MED ORDER — MOMETASONE FURO-FORMOTEROL FUM 100-5 MCG/ACT IN AERO
2.0000 | INHALATION_SPRAY | Freq: Two times a day (BID) | RESPIRATORY_TRACT | Status: DC
  Filled 2015-10-14: qty 8.8

## 2015-10-14 MED ORDER — FENTANYL CITRATE (PF) 100 MCG/2ML IJ SOLN
INTRAMUSCULAR | Status: AC
  Filled 2015-10-14: qty 2

## 2015-10-14 MED ORDER — MUPIROCIN 2 % EX OINT
1.0000 "application " | TOPICAL_OINTMENT | Freq: Once | CUTANEOUS | Status: AC
  Administered 2015-10-14: 1 via TOPICAL
  Filled 2015-10-14: qty 22

## 2015-10-14 MED ORDER — CHLORHEXIDINE GLUCONATE 4 % EX LIQD
60.0000 mL | Freq: Once | CUTANEOUS | Status: DC
  Filled 2015-10-14: qty 60

## 2015-10-14 MED ORDER — HEPARIN (PORCINE) IN NACL 2-0.9 UNIT/ML-% IJ SOLN
INTRAMUSCULAR | Status: DC | PRN
  Administered 2015-10-14: 09:00:00

## 2015-10-14 MED ORDER — FUROSEMIDE 20 MG PO TABS
20.0000 mg | ORAL_TABLET | Freq: Every day | ORAL | Status: DC
  Administered 2015-10-14: 20 mg via ORAL
  Filled 2015-10-14: qty 1

## 2015-10-14 MED ORDER — SODIUM CHLORIDE 0.9 % IV SOLN: INTRAVENOUS | Status: AC

## 2015-10-14 MED ORDER — PANTOPRAZOLE SODIUM 40 MG PO TBEC
40.0000 mg | DELAYED_RELEASE_TABLET | Freq: Two times a day (BID) | ORAL | Status: DC
  Administered 2015-10-14 – 2015-10-15 (×3): 40 mg via ORAL
  Filled 2015-10-14 (×4): qty 1

## 2015-10-14 MED ORDER — ACETAMINOPHEN 325 MG PO TABS
325.0000 mg | ORAL_TABLET | ORAL | Status: DC | PRN
  Filled 2015-10-14: qty 2

## 2015-10-14 MED ORDER — MUPIROCIN 2 % EX OINT
TOPICAL_OINTMENT | CUTANEOUS | Status: AC
Start: 2015-10-14 — End: 2015-10-14
  Administered 2015-10-14: 1 via TOPICAL
  Filled 2015-10-14: qty 22

## 2015-10-14 MED ORDER — LIDOCAINE HCL (PF) 1 % IJ SOLN
INTRAMUSCULAR | Status: AC
  Filled 2015-10-14: qty 60

## 2015-10-14 MED ORDER — SPIRONOLACTONE 25 MG PO TABS
25.0000 mg | ORAL_TABLET | Freq: Every day | ORAL | Status: DC
  Administered 2015-10-14: 25 mg via ORAL
  Filled 2015-10-14: qty 1

## 2015-10-14 MED ORDER — FENTANYL CITRATE (PF) 100 MCG/2ML IJ SOLN
INTRAMUSCULAR | Status: DC | PRN
  Administered 2015-10-14: 25 ug via INTRAVENOUS
  Administered 2015-10-14: 50 ug via INTRAVENOUS
  Administered 2015-10-14 (×3): 25 ug via INTRAVENOUS

## 2015-10-14 SURGICAL SUPPLY — 19 items
ADAPTER SEALING SSA-EW-09 (MISCELLANEOUS) ×2 IMPLANT
ADPR INTRO LNG 9FR SL XTD WNG (MISCELLANEOUS) ×1
CABLE SURGICAL S-101-97-12 (CABLE) ×2 IMPLANT
CATH ATTAIN COMMAND 6250-MB2 (CATHETERS) ×2 IMPLANT
HEMOSTAT SURGICEL 2X4 FIBR (HEMOSTASIS) ×2 IMPLANT
ICD VIVA QUAD XT CRT-D DTBA1QQ (ICD Generator) ×2 IMPLANT
KIT ESSENTIALS PG (KITS) ×2 IMPLANT
LEAD ATTAIN PERFORM ST 4398-88 (Lead) ×2 IMPLANT
LEAD CAPSURE NOVUS 45CM (Lead) ×2 IMPLANT
LEAD SPRINT QUAT SEC 6935M-55 (Lead) ×2 IMPLANT
PAD DEFIB LIFELINK (PAD) ×2 IMPLANT
SHEATH CLASSIC 7F (SHEATH) ×2 IMPLANT
SHEATH CLASSIC 9.5F (SHEATH) ×2 IMPLANT
SHEATH CLASSIC 9F (SHEATH) ×2 IMPLANT
SHIELD RADPAD SCOOP 12X17 (MISCELLANEOUS) ×2 IMPLANT
SLITTER 6232ADJ (MISCELLANEOUS) ×2 IMPLANT
TRAY PACEMAKER INSERTION (PACKS) ×2 IMPLANT
WIRE ACUITY WHISPER EDS 4648 (WIRE) ×2 IMPLANT
WIRE HI TORQ VERSACORE-J 145CM (WIRE) ×2 IMPLANT

## 2015-10-14 NOTE — Progress Notes (Signed)
Family in to visit. Drinking soda and eating teddy grahams. Waiting on tele bed assignment.

## 2015-10-14 NOTE — Discharge Instructions (Signed)
° ° °  Supplemental Discharge Instructions for  Pacemaker/Defibrillator Patients  Activity No heavy lifting or vigorous activity with your left/right arm for 6 to 8 weeks.  Do not raise your left/right arm above your head for one week.  Gradually raise your affected arm as drawn below.           __        10/18/15                  10/19/15                   10/20/15                 10/21/15  NO DRIVING for  1 week   ; you may begin driving on   [id number]  .  WOUND CARE - Keep the wound area clean and dry.   - No bandage is needed on the site.  DO  NOT apply any creams, oils, or ointments to the wound area. - If you notice any drainage or discharge from the wound, any swelling or bruising at the site, or you develop a fever > 101? F after you are discharged home, call the office at once.  Special Instructions - You are still able to use cellular telephones; use the ear opposite the side where you have your pacemaker/defibrillator.  Avoid carrying your cellular phone near your device. - When traveling through airports, show security personnel your identification card to avoid being screened in the metal detectors.  Ask the security personnel to use the hand wand. - Avoid arc welding equipment, MRI testing (magnetic resonance imaging), TENS units (transcutaneous nerve stimulators).  Call the office for questions about other devices. - Avoid electrical appliances that are in poor condition or are not properly grounded. - Microwave ovens are safe to be near or to operate.  Additional information for defibrillator patients should your device go off: - If your device goes off ONCE and you feel fine afterward, notify the device clinic nurses. - If your device goes off ONCE and you do not feel well afterward, call 911. - If your device goes off TWICE, call 911. - If your device goes off THREE times in one day, call 911.  DO NOT DRIVE YOURSELF OR A FAMILY MEMBER WITH A DEFIBRILLATOR TO THE  HOSPITAL--CALL 911.

## 2015-10-14 NOTE — H&P (View-Only) (Signed)
ELECTROPHYSIOLOGY CONSULT NOTE  Patient ID: Mary Pratt, MRN: [id number], DOB/AGE: [birth year] 45 y.o. Admit date: (Not on file) Date of Consult: 09/23/2015  Primary Physician: Gar Ponto, MD Primary Cardiologist: DM  Consulting Physician [place]   Chief Complaint: ICD   HPI Mary Pratt is a 45 y.o. female  Referred for consideration of ICD   May 2016 she  presented with CHF and found to have  a nonischemic cardiomyopathy    EF 20% with severe dilitation; RHC/LHC 6/16  MRI 7/16 demonstrated EF 30% with no LGE  Repeat echo 12/16 EF 30%  ECG 9/16 also demonstrated LBBB QRS 156   She has moderate exercise intolerance. She has significant fatigue. She has mild shortness of breath walking up inclines.  She denies edema orthopnea or nocturnal dyspnea.  She's had some palpitations but no syncope.  No family history cardiomyopathic disease  Past medical history is also notable for sickle trait    Past Medical History  Diagnosis Date  . GERD with stricture     dilated 2010  . Asthma   . Functional dyspepsia     early satiety   . 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 Sickle cell trait ([place])   . HELICOBACTER PYLORI GASTRITIS 01/15/2009    dyspnea, ? reaction to Biaxin/amoxicillin  Pylera 01/15/09 - completed therapy     . Anxiety   . Chronic systolic CHF (congestive heart failure) ([place])     a. 02/2015 Echo: EF 20%, sev dil w/ diff HK, worse @ inf base. Tiv AI, mild MR, mod dil LA, PASP 80mmHg.  . Situational depression     "son died"  . NICM (nonischemic cardiomyopathy) ([place])     a. 02/2015 Cath: nl cors, EF 15%, mild PAH;  b. 02/2015 Enrolled in VEST trial.      Surgical History:  Past Surgical History  Procedure Laterality Date  . Wisdom tooth extraction    . Esophagogastroduodenoscopy (egd) with esophageal dilation  11/30/2008    esophageal ring dilated 25 French, H. pylori gastritis  . Colposcopy  2013    neg  . Tubal ligation  1991  . Cardiac  catheterization N/A 02/26/2015    Procedure: Right/Left Heart Cath and Coronary Angiography;  Surgeon: Troy Sine, MD;  Location: [facility];  Service: Cardiovascular;  Laterality: N/A;     Home Meds: Prior to Admission medications   Medication Sig Start Date End Date Taking? Authorizing Provider  albuterol (PROVENTIL HFA;VENTOLIN HFA) 108 (90 BASE) MCG/ACT inhaler Inhale into the lungs every 6 (six) hours as needed for wheezing or shortness of breath.    Historical Provider, MD  carvedilol (COREG) 12.5 MG tablet Take 1.5 tablets (18.75 mg total) by mouth 2 (two) times daily with a meal. 08/07/15   Larey Dresser, MD  clonazePAM (KLONOPIN) 0.5 MG tablet Take 0.5 mg by mouth 2 (two) times daily as needed for anxiety.     Historical Provider, MD  digoxin (LANOXIN) 0.125 MG tablet TAKE ONE TABLET BY MOUTH ONCE DAILY 08/30/15   Jolaine Artist, MD  DULoxetine (CYMBALTA) 60 MG capsule Take 60 mg by mouth daily.    Historical Provider, MD  Fluticasone-Salmeterol (ADVAIR DISKUS) 250-50 MCG/DOSE AEPB Inhale 1 puff into the lungs 2 (two) times daily. 03/23/13   Kathee Delton, MD  furosemide (LASIX) 20 MG tablet Take 1 tablet (20 mg total) by mouth every other day. 04/11/15  Jolaine Artist, MD  loratadine (CLARITIN) 10 MG tablet Take 10 mg by mouth daily as needed for allergies.     Historical Provider, MD  NON FORMULARY ALLERGY INJECTIONS.  2 shots every 2 weeks    Historical Provider, MD  pantoprazole (PROTONIX) 40 MG tablet Take 40 mg by mouth 2 (two) times daily.  01/31/15   Historical Provider, MD  QNASL 80 MCG/ACT AERS Place 1 spray into the nose daily as needed (allergic rhinitis).  09/18/14   Historical Provider, MD  sacubitril-valsartan (ENTRESTO) 24-26 MG Take 1 tablet by mouth 2 (two) times daily. 02/28/15   Rogelia Mire, NP  spironolactone (ALDACTONE) 25 MG tablet Take 0.5 tablets (12.5 mg total) by mouth at bedtime. 07/08/15   Larey Dresser, MD    Allergies:    Allergies  Allergen Reactions  . Amoxicillin     REACTION: Tacycardia  . Clarithromycin Hives    Breaks out and gets whelps    Social History   Social History  . Marital Status: Divorced    Spouse Name: N/A  . Number of Children: 3  . Years of Education: N/A   Occupational History  . texturing operator    Social History Main Topics  . Smoking status: Never Smoker   . Smokeless tobacco: Never Used  . Alcohol Use: No  . Drug Use: No  . Sexual Activity:    Partners: Male    Birth Control/ Protection: Surgical     Comment: BTL   Other Topics Concern  . Not on file   Social History Narrative   Daily caffeine use - <1   2 sons 1 daughter 1 son deceased   Single        Family History  Problem Relation Age of Onset  . Colon cancer Neg Hx   . Diabetes Mother   . Hypertension Mother   . Hypothyroidism Mother   . Breast cancer Maternal Aunt   . Brain cancer Maternal Uncle   . Lung cancer Maternal Grandmother   . Colon polyps Neg Hx   . Esophageal cancer Neg Hx      ROS:  Please see the history of present illness.     All other systems reviewed and negative.    Physical Exam: Blood pressure 100/76, pulse 86, height 5\' 1"  (1.549 m), weight 144 lb (65.318 kg). General: Well developed, well nourished female in no acute distress. Head: Normocephalic, atraumatic, sclera non-icteric, no xanthomas, nares are without discharge. EENT: normal  Lymph Nodes:  none Neck: Negative for carotid bruits. JVD 6 Back:without scoliosis kyphosis Lungs: Clear bilaterally to auscultation without wheezes, rales, or rhonchi. Breathing is unlabored. Heart: RRR with S1 S2.  2/6 systolic  murmur . No rubs, or gallops appreciated. Abdomen: Soft, non-tender, non-distended with normoactive bowel sounds. No hepatomegaly. No rebound/guarding. No obvious abdominal masses. Msk:  Strength and tone appear normal for age. Extremities: No clubbing or cyanosis. No edema.  Distal pedal pulses are 2+  and equal bilaterally. Skin: Warm and Dry Neuro: Alert and oriented X 3. CN III-XII intact Grossly normal sensory and motor function . Psych:  Responds to questions appropriately with a normal affect.      Labs: Cardiac Enzymes No results for input(s): CKTOTAL, CKMB, TROPONINI in the last 72 hours. CBC Lab Results  Component Value Date   WBC 4.5 05/22/2015   HGB 12.1 05/22/2015   HCT 35.3* 05/22/2015   MCV 83.5 05/22/2015   PLT 232 05/22/2015   PROTIME:  No results for input(s): LABPROT, INR in the last 72 hours. Chemistry No results for input(s): NA, K, CL, CO2, BUN, CREATININE, CALCIUM, PROT, BILITOT, ALKPHOS, ALT, AST, GLUCOSE in the last 168 hours.  Invalid input(s): LABALBU Lipids No results found for: CHOL, HDL, LDLCALC, TRIG BNP PRO B NATRIURETIC PEPTIDE (BNP)  Date/Time Value Ref Range Status  02/18/2015 09:33 AM 1341.0* 0.0 - 100.0 pg/mL Final   Thyroid Function Tests: No results for input(s): TSH, T4TOTAL, T3FREE, THYROIDAB in the last 72 hours.  Invalid input(s): FREET3 Miscellaneous Lab Results  Component Value Date   DDIMER * 12/08/2008    2.46        AT THE INHOUSE ESTABLISHED CUTOFF VALUE OF 0.48 ug/mL FEU, THIS ASSAY HAS BEEN DOCUMENTED IN THE LITERATURE TO HAVE A SENSITIVITY AND NEGATIVE PREDICTIVE VALUE OF AT LEAST 98 TO 99%.  THE TEST RESULT SHOULD BE CORRELATED WITH AN ASSESSMENT OF THE CLINICAL PROBABILITY OF DVT / VTE.    Radiology/Studies:  No results found.  EKG: *Sinus rhythm at 86 Interval 17/15/40   Assessment and Plan:  Nonischemic cardiomyopathy  Congestive heart failure-chronic-systolic-grade 3  Left bundle branch block    The patient has persistent symptoms of heart failure characterized by both dyspnea as well as fatigue for nonischemic cardiomyopathy in the company left bundle branch block. She is on guideline directed medical therapy with up titration limited by hypotension. It is appropriate to consider CRT-D  implantation for both primary  prevention as well as the possibility of functional improvement in the context of her left bundle branch block  Have reviewed the potential benefits and risks of ICD implantation including but not limited to death, perforation of heart or lung, lead dislodgement, infection,  device malfunction and inappropriate shocks  As well as non response The patient and family express understanding  and are willing to proceed.  I have also spoke with Dr. DM regarding the use of Ivabradine given her sinus tachycardia and her ongoing symptoms. We'll start her on 2.5 mg      Virl Axe

## 2015-10-14 NOTE — Interval H&P Note (Signed)
ICD Criteria  Current LVEF:30%. Within 12 months prior to implant: Yes   Heart failure history: Yes, Class III  Cardiomyopathy history: Yes, Non-Ischemic Cardiomyopathy.  Atrial Fibrillation/Atrial Flutter: No.  Ventricular tachycardia history: No.  Cardiac arrest history: No.  History of syndromes with risk of sudden death: No.  Previous ICD: No.  Current ICD indication: Primary  PPM indication: Yes.  Biventricular '  Beta Blocker therapy for 3 or more months: Yes, prescribed.   Ace Inhibitor/ARB therapy for 3 or more months: Yes, prescribed.   History and Physical Interval Note:  10/14/2015 7:16 AM  Mary Pratt  has presented today for surgery, with the diagnosis of hf/cm  The various methods of treatment have been discussed with the patient and family. After consideration of risks, benefits and other options for treatment, the patient has consented to  Procedure(s): BiV ICD Insertion CRT-D (N/A) as a surgical intervention .  The patient's history has been reviewed, patient examined, no change in status, stable for surgery.  I have reviewed the patient's chart and labs.  Questions were answered to the patient's satisfaction.     Virl Axe

## 2015-10-14 NOTE — Discharge Summary (Signed)
ELECTROPHYSIOLOGY PROCEDURE DISCHARGE SUMMARY    Patient ID: Mary Pratt,  MRN: [id number], DOB/AGE: Nov 26, 1970 45 y.o.  Admit date: 10/14/2015 Discharge date: 10/15/2015  Primary Care Physician: Gar Ponto, MD Primary Cardiologist: [name] Electrophysiologist: Caryl Comes  Primary Discharge Diagnosis:  Non ischemic cardiomyopathy s/p CRTD implant this admission  Secondary Discharge Diagnosis:  1.  Chronic systolic heart failure 2.  LBBB 3.  GERD 4.  Sickle cell trait 5.  Asthma  Allergies  Allergen Reactions  . Amoxicillin Other (See Comments)    REACTION: Tacycardia  . Clarithromycin Hives    Breaks out and gets whelps     Procedures This Admission:  1.  Implantation of a MDT CRTD on 10/14/15 by Dr Caryl Comes.  See op note for full details.  DFT's were deferred at time of implant.  There were no immediate post procedure complications. 2.  CXR on 10/15/15 demonstrated no pneumothorax status post device implantation.   Brief HPI: Mary Pratt is a 46 y.o. female was referred to electrophysiology in the outpatient setting for consideration of ICD implantation.  Past medical history includes non ischemic cardiomyopathy, chronic systolic heart failure, and LBBB.  The patient has persistent LV dysfunction despite guideline directed therapy.  Risks, benefits, and alternatives to ICD implantation were reviewed with the patient who wished to proceed.   Hospital Course:  The patient was admitted and underwent implantation of a MDT CRTD with details as outlined above. She was monitored on telemetry overnight which demonstrated sinus rhythm with ventricular pacing.  Left chest was without hematoma or ecchymosis.  The device was interrogated and found to be functioning normally.  CXR was obtained and demonstrated no pneumothorax status post device implantation.  Wound care, arm mobility, and restrictions were reviewed with the patient.  The patient was examined and considered stable for  discharge to home.   The patient's discharge medications include an ARB (Valsartan) and beta blocker (Carevedilol).   Physical Exam: Filed Vitals:   10/14/15 1255 10/14/15 1718 10/14/15 2130 10/15/15 0605  BP: 120/70 102/56 105/70 110/69  Pulse:  80 78 73  Temp:   97.9 F (36.6 C) 97.7 F (36.5 C)  TempSrc:   Oral Oral  Resp:   20 18  Height:      Weight:    148 lb (67.132 kg)  SpO2:   96% 97%    GEN- The patient is well appearing, alert and oriented x 3 today.   HEENT: normocephalic, atraumatic; sclera clear, conjunctiva pink; hearing intact; oropharynx clear; neck supple  Lungs- Clear to ausculation bilaterally, normal work of breathing.  No wheezes, rales, rhonchi Heart- Regular rate and rhythm, no murmurs, rubs or gallops  GI- soft, non-tender, non-distended, bowel sounds present  Extremities- no clubbing, cyanosis, or edema; DP/PT/radial pulses 2+ bilaterally MS- no significant deformity or atrophy Skin- warm and dry, no rash or lesion, left chest without hematoma/ecchymosis Psych- euthymic mood, full affect Neuro- strength and sensation are intact   Labs:   Lab Results  Component Value Date   WBC 6.5 10/07/2015   HGB 12.0 10/07/2015   HCT 35.2* 10/07/2015   MCV 86.3 10/07/2015   PLT 229 10/07/2015    No results for input(s): NA, K, CL, CO2, BUN, CREATININE, CALCIUM, PROT, BILITOT, ALKPHOS, ALT, AST, GLUCOSE in the last 168 hours.  Invalid input(s): LABALBU  Discharge Medications:    Medication List    TAKE these medications        albuterol 108 (90 Base) MCG/ACT  inhaler  Commonly known as:  PROVENTIL HFA;VENTOLIN HFA  Inhale into the lungs every 6 (six) hours as needed for wheezing or shortness of breath.     carvedilol 12.5 MG tablet  Commonly known as:  COREG  Take 1.5 tablets (18.75 mg total) by mouth 2 (two) times daily with a meal.     clonazePAM 0.5 MG tablet  Commonly known as:  KLONOPIN  Take 0.5 mg by mouth 2 (two) times daily as needed for  anxiety.     digoxin 0.125 MG tablet  Commonly known as:  LANOXIN  TAKE ONE TABLET BY MOUTH ONCE DAILY     DULoxetine 60 MG capsule  Commonly known as:  CYMBALTA  Take 60 mg by mouth 2 (two) times daily.     fexofenadine 180 MG tablet  Commonly known as:  ALLEGRA  Take 180 mg by mouth daily.     Fluticasone-Salmeterol 250-50 MCG/DOSE Aepb  Commonly known as:  ADVAIR DISKUS  Inhale 1 puff into the lungs 2 (two) times daily.     furosemide 20 MG tablet  Commonly known as:  LASIX  Take 1 tablet (20 mg total) by mouth every other day.     HYDROcodone-acetaminophen 5-325 MG tablet  Commonly known as:  NORCO  Take 1 tablet by mouth every 6 (six) hours as needed for moderate pain.     pantoprazole 40 MG tablet  Commonly known as:  PROTONIX  Take 40 mg by mouth 2 (two) times daily.     QNASL 80 MCG/ACT Aers  Generic drug:  Beclomethasone Dipropionate  Place 1 spray into the nose daily as needed (allergic rhinitis).     sacubitril-valsartan 24-26 MG  Commonly known as:  ENTRESTO  Take 1 tablet by mouth 2 (two) times daily.     spironolactone 25 MG tablet  Commonly known as:  ALDACTONE  Take 1 tablet (25 mg total) by mouth at bedtime.     traZODone 50 MG tablet  Commonly known as:  DESYREL  Take 50 mg by mouth at bedtime as needed for sleep.        Disposition:  Discharge Instructions    Diet - low sodium heart healthy    Complete by:  As directed      Increase activity slowly    Complete by:  As directed           Follow-up Information    Follow up with [facility] On 10/24/2015.   Specialty:  Cardiology   Why:  at 10:30AM for wound check    Contact information:   [address], [place] [phone]      Follow up with [name], MD On 12/05/2015.   Specialty:  Cardiology   Why:  at [facility] information:   [address] [place] [address] [phone]       Follow  up with Virl Axe, MD On 01/22/2016.   Specialty:  Cardiology   Why:  at 11:15AM   Contact information:   [address] [address] [phone]       Duration of Discharge Encounter: Greater than 30 minutes including physician time.  Signed, Chanetta Marshall, NP 10/15/2015 9:39 AM     I have seen and examined this patient with [employer].  Agree with above, note added to reflect my findings.  On exam, regular rhythm, no murmurs, lungs clear. Had CRT  D placed yesterday without complication. Lead parameters are consistent with implant, and chest x-ray without abnormalities. We'll plan on discharge today with follow up in device clinic in 10 days.    Adison Jerger M. Wilfrido Luedke MD 10/15/2015 8:07 PM

## 2015-10-15 ENCOUNTER — Ambulatory Visit (HOSPITAL_COMMUNITY): Payer: BLUE CROSS/BLUE SHIELD

## 2015-10-15 DIAGNOSIS — D573 Sickle-cell trait: Secondary | ICD-10-CM | POA: Diagnosis not present

## 2015-10-15 DIAGNOSIS — I509 Heart failure, unspecified: Secondary | ICD-10-CM | POA: Diagnosis not present

## 2015-10-15 DIAGNOSIS — I429 Cardiomyopathy, unspecified: Secondary | ICD-10-CM

## 2015-10-15 DIAGNOSIS — I447 Left bundle-branch block, unspecified: Secondary | ICD-10-CM | POA: Diagnosis not present

## 2015-10-15 MED ORDER — HYDROCODONE-ACETAMINOPHEN 5-325 MG PO TABS
1.0000 | ORAL_TABLET | Freq: Four times a day (QID) | ORAL | Status: DC | PRN
Start: 2015-10-15 — End: 2015-12-05

## 2015-10-15 MED FILL — HYDROCODON-APAP 5-325: 5-325 | 2 days supply | Qty: 10 | Fill #0

## 2015-10-15 NOTE — Op Note (Signed)
Mary Pratt, Mary Pratt NO.:  [id number]  MEDICAL RECORD NO.:  [id number]  LOCATION:  [id number]                        FACILITY:  [facility]  PHYSICIAN:  Deboraha Sprang, MD, FACCDATE OF BIRTH:  [birth year]  DATE OF PROCEDURE:  10/14/2015 DATE OF DISCHARGE:                              OPERATIVE REPORT   PREOPERATIVE DIAGNOSES:  Congestive heart failure, nonischemic cardiomyopathy, left bundle-branch block.  POSTOPERATIVE DIAGNOSES:  Congestive heart failure, nonischemic cardiomyopathy, left bundle-branch block.  PROCEDURE:  Dual-chamber ICD implantation with left ventricular lead deployment.  DESCRIPTION OF PROCEDURE:  Following obtaining informed consent, the patient was brought to the electrophysiology laboratory and placed on the fluoroscopic table in the supine position.  After routine prep and drape, lidocaine was infiltrated in the prepectoral subclavicular region.  Incision was made and carried down to the layer of prepectoral fascia using electrocautery and sharp dissection.  A pocket was formed similarly, hemostasis was obtained.  Thereafter, attention was turned to gain access to the extrathoracic left subclavian vein which was accomplished without difficulty, without the aspiration or puncture of the artery.  Three separate venipunctures were accomplished.  Guidewires were placed and retained; and sequentially 9, 9.5, and 7-French sheaths were placed which were passed with a Medtronic 6935 55 cm active fixation, single coil defibrillator lead, serial number [id number], a Medtronic MB2CS cannulation sheath and a Medtronic 5076 45 cm active fixation atrial lead serial number [id number].  Under fluoroscopic guidance, the RV lead was manipulated to the apex with bipolar R-wave was 8.6 with a pace impedance of 731, a threshold 0.9 V at 0.5 milliseconds.  Current threshold was 1.2 mA.  There is no diaphragmatic pacing at 10 V.  The current of injury was brisk.   This lead was secured to the prepectoral fascia.  We then obtained access to the coronary sinus with actual sub selection of a posterior branch that coursed up onto the lateral well.  We then deployed a Medtronic 4398 88 cm lead, serial number [id number] to the point where the distal tip was at the junction of the mid and distal third.  We actually paced off a pulse 2 and 3 with an R-wave of 3, pace impedance of 951, threshold 1.9 at 0.5 milliseconds.  There is no diaphragmatic pacing at 10 V.  The 9.5-French sheath was removed.  We then deployed the atrial lead in the right atrial appendage where bipolar P-wave was 3.5 with a pace impedance of 740 threshold, 0.7 at 0.5 milliseconds.  Current of threshold was 1.0 mA.  There was no diaphragmatic pacing at 10 V.  The current of injury was brisk. Thereafter, this lead was secured to the prepectoral fascia.  We then removed the CS deployment sheath under fluoroscopic guidance, reassessed the LV lead which seemed to have been stable.  We attached the leads into a Medtronic [employer] CRT device, serial number [id number]. Through the device, bipolar P-wave was 1.5 with a pace impedance of 570 threshold 0.5 V at 0.4 milliseconds.  The R-wave was 8 with a pace impedance of 646, a threshold of 0.5 V at 0.4 milliseconds, and the LV lead impedance was 722 with threshold 3 V at  0.5 milliseconds and 2 V at 1.0 milliseconds.  This lead was secured to the prepectoral fascia.  The high-voltage impedance was 82 ohms.  It should be noted that the QOV was 130-150 milliseconds in the 23/32 configuration.  The pocket was copiously irrigated with antibiotic containing saline solution.  Hemostasis was assured.  Leads and the pulse generator placed in the pocket, secured to the prepectoral fascia.  The wound was closed in 2 layers in normal fashion following Surgicel deployment at the cephalad aspect of the pocket.  The patient tolerated the  procedure without apparent complication.     Deboraha Sprang, MD, [facility]     SCK/MEDQ  D:  10/14/2015  T:  10/15/2015  Job:  [id number]

## 2015-10-16 ENCOUNTER — Encounter: Payer: Self-pay | Admitting: Internal Medicine

## 2015-10-16 ENCOUNTER — Encounter (HOSPITAL_COMMUNITY): Payer: Self-pay

## 2015-10-16 NOTE — Progress Notes (Signed)
Attending provider statement completed, signed, and faxed by Dr. Haroldine Laws to [employer] at provided # [phone] Copy of form scanned into electronic medical records.  Renee Pain

## 2015-10-17 ENCOUNTER — Telehealth (HOSPITAL_COMMUNITY): Payer: Self-pay | Admitting: Cardiology

## 2015-10-17 NOTE — Telephone Encounter (Signed)
Patient called with concerns regarding increased abdominal swelling and slight increase in weight  Normal weight for patient is 141-143 Weight is 147-148 Denies: sob cough, confusion, chest pain, LE edema   per VO Oda [place], [place] Patient should take Lasix daily 20 mg and return for labs on 2/13  Pt aware and voiced understanding, labs 2/13 @ 1130

## 2015-10-21 ENCOUNTER — Ambulatory Visit (HOSPITAL_COMMUNITY)
Admission: RE | Admit: 2015-10-21 | Discharge: 2015-10-21 | Disposition: A | Discharge status: Home / Self Care | Payer: BLUE CROSS/BLUE SHIELD | Source: Ambulatory Visit | Attending: Internal Medicine | Admitting: Internal Medicine

## 2015-10-21 DIAGNOSIS — I5022 Chronic systolic (congestive) heart failure: Secondary | ICD-10-CM | POA: Insufficient documentation

## 2015-10-21 LAB — BASIC METABOLIC PANEL
Anion gap: 11 (ref 5–15)
BUN: 10 mg/dL (ref 6–20)
CO2: 27 mmol/L (ref 22–32)
Calcium: 9.6 mg/dL (ref 8.9–10.3)
Chloride: 98 mmol/L — ABNORMAL LOW (ref 101–111)
Creatinine, Ser: 1.08 mg/dL — ABNORMAL HIGH (ref 0.44–1.00)
GFR calc Af Amer: >60 mL/min (ref >60)
GFR calc non Af Amer: >60 mL/min (ref >60)
Glucose, Bld: 125 mg/dL — ABNORMAL HIGH (ref 65–99)
Potassium: 4.3 mmol/L (ref 3.5–5.1)
Sodium: 136 mmol/L (ref 135–145)

## 2015-10-24 ENCOUNTER — Encounter: Payer: Self-pay | Admitting: Internal Medicine

## 2015-10-24 ENCOUNTER — Other Ambulatory Visit: Payer: BLUE CROSS/BLUE SHIELD | Admitting: *Deleted

## 2015-10-24 ENCOUNTER — Ambulatory Visit (INDEPENDENT_AMBULATORY_CARE_PROVIDER_SITE_OTHER): Payer: BLUE CROSS/BLUE SHIELD | Admitting: *Deleted

## 2015-10-24 DIAGNOSIS — Z95810 Presence of automatic (implantable) cardiac defibrillator: Secondary | ICD-10-CM

## 2015-10-24 DIAGNOSIS — I447 Left bundle-branch block, unspecified: Secondary | ICD-10-CM | POA: Diagnosis not present

## 2015-10-24 DIAGNOSIS — I5022 Chronic systolic (congestive) heart failure: Secondary | ICD-10-CM

## 2015-10-24 DIAGNOSIS — I428 Other cardiomyopathies: Secondary | ICD-10-CM

## 2015-10-24 DIAGNOSIS — I429 Cardiomyopathy, unspecified: Secondary | ICD-10-CM

## 2015-10-24 NOTE — Addendum Note (Signed)
Addended by: Eulis Foster on: 10/24/2015 11:09 AM   Modules accepted: Orders

## 2015-10-27 LAB — CUP PACEART INCLINIC DEVICE CHECK
Battery Remaining Longevity: 50 mo
Battery Voltage: 3.05 V
Brady Statistic AP VP Percent: 0.08 %
Brady Statistic AP VS Percent: 0.03 %
Brady Statistic AS VP Percent: 99.79 %
Brady Statistic AS VS Percent: 0.09 %
Brady Statistic RA Percent Paced: 0.11 %
Brady Statistic RV Percent Paced: 99.87 %
Date Time Interrogation Session: 20170216164649
HighPow Impedance: 63 Ohm
Implantable Lead Implant Date: 20170206
Implantable Lead Implant Date: 20170206
Implantable Lead Implant Date: 20170206
Implantable Lead Location: 753862
Implantable Lead Location: 753862
Implantable Lead Location: 753862
Implantable Lead Model: 4398
Implantable Lead Model: 5076
Lead Channel Impedance Value: 1007 Ohm
Lead Channel Impedance Value: 1121 Ohm
Lead Channel Impedance Value: 1178 Ohm
Lead Channel Impedance Value: 1311 Ohm
Lead Channel Impedance Value: 456 Ohm
Lead Channel Impedance Value: 494 Ohm
Lead Channel Impedance Value: 513 Ohm
Lead Channel Impedance Value: 513 Ohm
Lead Channel Impedance Value: 646 Ohm
Lead Channel Impedance Value: 665 Ohm
Lead Channel Impedance Value: 779 Ohm
Lead Channel Impedance Value: 817 Ohm
Lead Channel Impedance Value: 874 Ohm
Lead Channel Pacing Threshold Amplitude: 0.5 V
Lead Channel Pacing Threshold Amplitude: 0.5 V
Lead Channel Pacing Threshold Amplitude: 3 V
Lead Channel Pacing Threshold Pulse Width: 0.4 ms
Lead Channel Pacing Threshold Pulse Width: 0.4 ms
Lead Channel Pacing Threshold Pulse Width: 1 ms
Lead Channel Sensing Intrinsic Amplitude: 11.5 mV
Lead Channel Sensing Intrinsic Amplitude: 2.625 mV
Lead Channel Setting Pacing Amplitude: 3.5 V
Lead Channel Setting Pacing Amplitude: 3.5 V
Lead Channel Setting Pacing Amplitude: 4.75 V
Lead Channel Setting Pacing Pulse Width: 0.4 ms
Lead Channel Setting Pacing Pulse Width: 1 ms
Lead Channel Setting Sensing Sensitivity: 0.3 mV

## 2015-10-27 NOTE — Progress Notes (Signed)
Wound check appointment. Dermabond removed. Wound without redness or edema. Incision edges approximated, wound well healed. Normal device function. Thresholds, sensing, and impedances consistent with implant measurements. Device programmed at 3.5V for extra safety margin until 3 month visit. Histogram distribution appropriate for patient and level of activity. No mode switches or ventricular arrhythmias noted. Patient educated about wound care, arm mobility, lifting restrictions, shock plan. ROV with SK in 3 months.

## 2015-10-29 ENCOUNTER — Encounter (HOSPITAL_COMMUNITY): Payer: Self-pay | Admitting: *Deleted

## 2015-10-29 NOTE — Progress Notes (Signed)
Completed pt's insurance form from [employer] stating pt is still unable to work, form faxed to [phone]

## 2015-11-07 ENCOUNTER — Other Ambulatory Visit: Payer: Self-pay | Admitting: Nurse Practitioner

## 2015-11-12 ENCOUNTER — Telehealth (HOSPITAL_COMMUNITY): Payer: Self-pay | Admitting: *Deleted

## 2015-11-12 MED FILL — ENTRESTO 24 MG-26 MG TABLET: 24-26 | 30 days supply | Qty: 60 | Fill #6

## 2015-11-12 NOTE — Telephone Encounter (Signed)
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 She states she needs this by Friday. She requests a call back from [facility]

## 2015-11-13 ENCOUNTER — Encounter (HOSPITAL_COMMUNITY): Payer: Self-pay

## 2015-11-14 ENCOUNTER — Encounter (HOSPITAL_COMMUNITY): Payer: Self-pay

## 2015-11-14 NOTE — Telephone Encounter (Signed)
Attempted to return call to patient, no answer. Left VM informing patient that I have received a fax request for medical records to be sent to [place] and will complete that today. Advised that if something needed to be sent in to her work specifically listing restrictions, to see if she can have her employer fax CH[employer] office a form to complete for restrictions/duty. Renee Pain

## 2015-11-14 NOTE — Progress Notes (Signed)
Job description faxed in from patient's employer [employer]. Scanned into patient's electronic medical records.  Renee Pain

## 2015-11-14 NOTE — Progress Notes (Signed)
[place] [facility] faxed in medical record request on behalf of patient for records 09/2015-present. All records from CHF office/providers faxed to provided # [phone] Copy of request scanned into patient's electronic medical record. Case # [id number]  Renee Pain

## 2015-12-04 ENCOUNTER — Other Ambulatory Visit: Payer: Self-pay | Admitting: Nurse Practitioner

## 2015-12-04 MED FILL — ENTRESTO 24 MG-26 MG TABLET: 24-26 | 30 days supply | Qty: 60 | Fill #0

## 2015-12-05 ENCOUNTER — Encounter (HOSPITAL_COMMUNITY): Payer: Self-pay

## 2015-12-05 ENCOUNTER — Ambulatory Visit (HOSPITAL_COMMUNITY)
Admission: RE | Admit: 2015-12-05 | Discharge: 2015-12-05 | Disposition: A | Discharge status: Home / Self Care | Payer: BLUE CROSS/BLUE SHIELD | Source: Ambulatory Visit | Attending: Cardiology | Admitting: Cardiology

## 2015-12-05 ENCOUNTER — Encounter (HOSPITAL_COMMUNITY): Payer: Self-pay | Admitting: *Deleted

## 2015-12-05 VITALS — BP 98/64 | HR 83 | Ht 61.0 in | Wt 144.8 lb

## 2015-12-05 DIAGNOSIS — D573 Sickle-cell trait: Secondary | ICD-10-CM | POA: Diagnosis not present

## 2015-12-05 DIAGNOSIS — Z95810 Presence of automatic (implantable) cardiac defibrillator: Secondary | ICD-10-CM | POA: Insufficient documentation

## 2015-12-05 DIAGNOSIS — I5022 Chronic systolic (congestive) heart failure: Secondary | ICD-10-CM | POA: Diagnosis not present

## 2015-12-05 DIAGNOSIS — J45909 Unspecified asthma, uncomplicated: Secondary | ICD-10-CM | POA: Insufficient documentation

## 2015-12-05 DIAGNOSIS — K219 Gastro-esophageal reflux disease without esophagitis: Secondary | ICD-10-CM | POA: Insufficient documentation

## 2015-12-05 DIAGNOSIS — Z79899 Other long term (current) drug therapy: Secondary | ICD-10-CM | POA: Insufficient documentation

## 2015-12-05 DIAGNOSIS — I428 Other cardiomyopathies: Secondary | ICD-10-CM | POA: Insufficient documentation

## 2015-12-05 DIAGNOSIS — Z736 Limitation of activities due to disability: Secondary | ICD-10-CM

## 2015-12-05 NOTE — Patient Instructions (Signed)
Routine lab work today. Will notify you of abnormal results  Follow up with Dr.McLean in 3 months.  

## 2015-12-05 NOTE — Progress Notes (Signed)
Patient ID: Mary Pratt, female   DOB: April 03, 1971, 45 y.o.   MRN: [id number] PCP: Dr. Gar Ponto Cardiology: Dr. [name]  45 yo presents for evaluation of nonischemic dilated cardiomyopathy.  She developed exertional dyspnea and orthopnea in 5/16.  She had had no prior known cardiac problems.  She was admitted in 6/16 with CHF.  Echo showed EF 20% with severe LV dilation.  Coronary angiography showed normal coronaries.  RHC showed preserved cardiac output.  She was diuresed and started on cardiac meds. Repeat echo in 12/16 showed EF 30% with septal-lateral dyssynchrony.  She had Medtronic CRT-D system placed in 2/17.   Today she returns for HF follow up. She is short of breath walking about a block or walking up a flight of stairs.  She is no longer working.  No chest pain.  No orthopnea/PND.  Some generalized fatigue.    Labs (6/16): K 4.5, creatinine 1.11, TSH normal Labs (7/16): K 3.8, creatinine 1.13, BNP 353, SPEP negative Labs (8/16): K 4.2, creatinine 1.11 Labs (05/22/2015): K 3.8 Creatinine 1.01  Labs (11/16): digoxin 0.5, K 4, creatinine 1.11 Labs (12/16): K 4, creatinine 1.11, digoxin 0.5 Labs (2/17): K 4.3, creatinine 1.08  PMH: 1. GERD 2. Asthma 3. Sickle cell trait 4. H/o H pylori gastritis 5. Cardiomyopathy: Nonischemic.  Echo (6/16) with EF 20%, severe LV dilation, mild MR, PA systolic pressure 54 mmHg.  LHC/RHC (6/16) with EF 15%, CI 2.56 Fick/2.12 thermo, mean RA 1, PA 39/13 mean 26, mean PCWP 12, normal coronaries. cMRI (7/16) with moderately dilated LV with EF 31%, prominent LV septal-lateral dyssynchrony, normal RV size and systolic function, no myocardial LGE, so no definitive evidence for prior MI, Myocarditis, or infiltrative disease.  CPX (9/16) with peak VO2 15.9, VE/VCO2 slope 23.9, RER 1.09 => moderate functional impairment.  Echo (12/16) with EF 30%, moderate LVH, septal-lateral dyssynchrony, normal RV size and systolic function.  - Medtronic CRT-D placed 2/17.    SH: Lives with son in [place].  Nonsmoker.  No ETOH or drugs. Used to work at [employer]  Divorced.   FH: No known heart disease.   ROS: All systems reviewed and negative except as per HPI.   Current Outpatient Prescriptions  Medication Sig Dispense Refill  . albuterol (PROVENTIL HFA;VENTOLIN HFA) 108 (90 BASE) MCG/ACT inhaler Inhale into the lungs every 6 (six) hours as needed for wheezing or shortness of breath.    . carvedilol (COREG) 12.5 MG tablet Take 1.5 tablets (18.75 mg total) by mouth 2 (two) times daily with a meal. 90 tablet 6  . clonazePAM (KLONOPIN) 0.5 MG tablet Take 0.5 mg by mouth 2 (two) times daily as needed for anxiety.     . digoxin (LANOXIN) 0.125 MG tablet TAKE ONE TABLET BY MOUTH ONCE DAILY 30 tablet 3  . DULoxetine (CYMBALTA) 60 MG capsule Take 60 mg by mouth 2 (two) times daily.     . fexofenadine (ALLEGRA) 180 MG tablet Take 180 mg by mouth daily.    . Fluticasone-Salmeterol (ADVAIR DISKUS) 250-50 MCG/DOSE AEPB Inhale 1 puff into the lungs 2 (two) times daily. 180 each 4  . furosemide (LASIX) 20 MG tablet Take 1 tablet (20 mg total) by mouth every other day. 15 tablet 4  . pantoprazole (PROTONIX) 40 MG tablet Take 40 mg by mouth 2 (two) times daily.     [name] 80 MCG/ACT AERS Place 1 spray into the nose daily as needed (allergic rhinitis).     . sacubitril-valsartan (ENTRESTO) 24-26 MG  Take 1 tablet by mouth 2 (two) times daily. 60 tablet 6  . spironolactone (ALDACTONE) 25 MG tablet Take 1 tablet (25 mg total) by mouth at bedtime. 30 tablet 6  . traZODone (DESYREL) 50 MG tablet Take 50 mg by mouth at bedtime as needed for sleep.     No current facility-administered medications for this encounter.   BP 98/64 mmHg  Pulse 83  Ht 5\' 1"  (1.549 m)  Wt 144 lb 12.8 oz (65.681 kg)  BMI 27.37 kg/m2  SpO2 98%  LMP 11/06/2015  General: NAD. Ambulated in the clinic without difficulty.  Neck: No JVD, no thyromegaly or thyroid nodule.  Lungs: Clear to auscultation  bilaterally with normal respiratory effort. CV: Nondisplaced PMI.  Heart regular S1/S2, no S3/S4, no murmur.  No peripheral edema.  No carotid bruit.  Normal pedal pulses.  Abdomen: Soft, nontender, no hepatosplenomegaly, no distention.  Skin: Intact without lesions or rashes.  Neurologic: Alert and oriented x 3.  Psych: Normal affect. Extremities: No clubbing or cyanosis.  HEENT: Normal.   Assessment/Plan: Chronic systolic CHF: Nonischemic cardiomyopathy.  Etiology uncertain: normal coronaries, no history of ETOH or drug abuse, no family history of cardiomyopathy.  No prior history of HTN.  Possible that it is related to viral myocarditis.   cMRI in 7/16 showed some improvement of EF to 31%.  Also showed LV dyssynchrony.  CPX in 9/16 showed a moderate functional limitation from CHF.  Stable NYHA class III symptoms. Volume status stable. Most recent echo in 12/16 with EF 30%, septal-lateral dyssynchrony.  She had Medtronic CRT-D placed in 2/17.  - Continue lasix 20 mg every other day.  BMET today. - Continue Coreg 18.75 mg bid.  No BP room to increase.  - Continue digoxin 0.125 mg daily, check level.  -Continue current dose of Entresto.  No BP room to increase. - Continue spironolactone 25 daily.  - Repeat echo in 8/17 to look for improvement with CRT.   Followup in 3 months.  [name]  12/05/2015

## 2015-12-24 ENCOUNTER — Encounter (HOSPITAL_COMMUNITY): Payer: Self-pay

## 2015-12-24 NOTE — Progress Notes (Signed)
Form from [employer] [place] for Physician authorization Statement completed and signed by Dr. [name] for 3 months of continuation for complete disability. Patient due to f/u in June. Faxed to provided # [phone] Copy of form scanned into patient's electronic medical record.  Renee Pain

## 2016-01-13 ENCOUNTER — Telehealth: Payer: Self-pay | Admitting: Internal Medicine

## 2016-01-13 NOTE — Telephone Encounter (Signed)
Bi-V ICD implanted in February 2017. Will forward to MD to review.

## 2016-01-13 NOTE — Telephone Encounter (Signed)
Pt calling c/o Dr. Caryl Comes telling her not to drive a long distance until he see's her 01-22-16, she wants to go to [place] and is asking if this is too long a drive? pls advise

## 2016-01-13 NOTE — Telephone Encounter (Signed)
No problem with driving

## 2016-01-14 NOTE — Telephone Encounter (Signed)
The patient is aware that she may drive to [facility] per Dr. Caryl Comes.

## 2016-01-14 NOTE — Telephone Encounter (Signed)
I left a message for the patient to call. 

## 2016-01-20 ENCOUNTER — Encounter: Payer: Self-pay | Admitting: Internal Medicine

## 2016-01-22 ENCOUNTER — Encounter: Payer: Self-pay | Admitting: Internal Medicine

## 2016-01-22 ENCOUNTER — Ambulatory Visit (INDEPENDENT_AMBULATORY_CARE_PROVIDER_SITE_OTHER): Payer: Medicaid Other | Admitting: Internal Medicine

## 2016-01-22 VITALS — BP 106/88 | HR 87 | Ht 61.0 in | Wt 145.0 lb

## 2016-01-22 DIAGNOSIS — I5022 Chronic systolic (congestive) heart failure: Secondary | ICD-10-CM | POA: Diagnosis not present

## 2016-01-22 DIAGNOSIS — Z95810 Presence of automatic (implantable) cardiac defibrillator: Secondary | ICD-10-CM

## 2016-01-22 DIAGNOSIS — I447 Left bundle-branch block, unspecified: Secondary | ICD-10-CM | POA: Diagnosis not present

## 2016-01-22 DIAGNOSIS — I428 Other cardiomyopathies: Secondary | ICD-10-CM

## 2016-01-22 DIAGNOSIS — I429 Cardiomyopathy, unspecified: Secondary | ICD-10-CM

## 2016-01-22 LAB — BASIC METABOLIC PANEL
BUN: 9 mg/dL (ref 7–25)
CO2: 30 mmol/L (ref 20–31)
Calcium: 9.5 mg/dL (ref 8.6–10.2)
Chloride: 99 mmol/L (ref 98–110)
Creat: 0.91 mg/dL (ref 0.50–1.10)
Glucose, Bld: 92 mg/dL (ref 65–99)
Potassium: 4.2 mmol/L (ref 3.5–5.3)
Sodium: 136 mmol/L (ref 135–146)

## 2016-01-22 MED ORDER — CARVEDILOL 25 MG PO TABS: 25.0000 mg | ORAL_TABLET | Freq: Two times a day (BID) | ORAL | Status: DC

## 2016-01-22 MED FILL — ENTRESTO 24 MG-26 MG TABLET: 24-26 | 30 days supply | Qty: 60 | Fill #1

## 2016-01-22 NOTE — Progress Notes (Signed)
Patient Care Team: Caryl Bis, MD as PCP - General (Family Medicine)   HPI  Mary Pratt is a 45 y.o. female Seen in follow-up for CRT-D implanted 2/17 for nonischemic cardiomyopathy and congestive heart failure She has not noted clear symptomatic improvement; some decrease in SOB but still with fatigue No edema   Records and Results Reviewed--notes from CHF clinic  Past Medical History  Diagnosis Date  . GERD with stricture     dilated 2010  . Asthma   . Functional dyspepsia     early satiety   . 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 Sickle cell trait ([place])   . HELICOBACTER PYLORI GASTRITIS 01/15/2009    dyspnea, ? reaction to Biaxin/amoxicillin  Pylera 01/15/09 - completed therapy     . Anxiety   . Chronic systolic CHF (congestive heart failure) ([place])     a. 02/2015 Echo: EF 20%, sev dil w/ diff HK, worse @ inf base. Tiv AI, mild MR, mod dil LA, PASP 46mmHg.  . Situational depression     "son died"  . NICM (nonischemic cardiomyopathy) ([place])     a. 02/2015 Cath: nl cors, EF 15%, mild PAH;  b. 02/2015 Enrolled in VEST trial.    Past Surgical History  Procedure Laterality Date  . Wisdom tooth extraction    . Esophagogastroduodenoscopy (egd) with esophageal dilation  11/30/2008    esophageal ring dilated 36 French, H. pylori gastritis  . Colposcopy  2013    neg  . Tubal ligation  1991  . Cardiac catheterization N/A 02/26/2015    Procedure: Right/Left Heart Cath and Coronary Angiography;  Surgeon: Troy Sine, MD;  Location: [facility];  Service: Cardiovascular;  Laterality: N/A;  . Ep implantable device N/A 10/14/2015    Procedure: BiV ICD Insertion CRT-D;  Surgeon: Deboraha Sprang, MD;  Location: [facility];  Service: Cardiovascular;  Laterality: N/A;    Current Outpatient Prescriptions  Medication Sig Dispense Refill  . albuterol (PROVENTIL HFA;VENTOLIN HFA) 108 (90 BASE) MCG/ACT inhaler Inhale into the lungs every 6 (six) hours  as needed for wheezing or shortness of breath.    . carvedilol (COREG) 12.5 MG tablet Take 1.5 tablets (18.75 mg total) by mouth 2 (two) times daily with a meal. 90 tablet 6  . clonazePAM (KLONOPIN) 0.5 MG tablet Take 0.5 mg by mouth 2 (two) times daily as needed for anxiety.     . digoxin (LANOXIN) 0.125 MG tablet TAKE ONE TABLET BY MOUTH ONCE DAILY 30 tablet 3  . DULoxetine (CYMBALTA) 60 MG capsule Take 60 mg by mouth 2 (two) times daily.     . fexofenadine (ALLEGRA) 180 MG tablet Take 180 mg by mouth daily.    . Fluticasone-Salmeterol (ADVAIR DISKUS) 250-50 MCG/DOSE AEPB Inhale 1 puff into the lungs 2 (two) times daily. 180 each 4  . furosemide (LASIX) 20 MG tablet Take 1 tablet (20 mg total) by mouth every other day. 15 tablet 4  . HYDROcodone-acetaminophen (NORCO/VICODIN) 5-325 MG tablet Take 1 tablet by mouth every 6 (six) hours as needed. (pain)  0  . pantoprazole (PROTONIX) 40 MG tablet Take 40 mg by mouth 2 (two) times daily.     [name] 80 MCG/ACT AERS Place 1 spray into the nose daily as needed (allergic rhinitis).     . sacubitril-valsartan (ENTRESTO) 24-26 MG Take 1 tablet by mouth 2 (two) times daily. 60 tablet 6  .  spironolactone (ALDACTONE) 25 MG tablet Take 1 tablet (25 mg total) by mouth at bedtime. 30 tablet 6  . traZODone (DESYREL) 50 MG tablet Take 50 mg by mouth at bedtime as needed for sleep.     No current facility-administered medications for this visit.    Allergies  Allergen Reactions  . Amoxicillin Other (See Comments)    REACTION: Tacycardia  . Clarithromycin Hives    Breaks out and gets whelps      Review of Systems negative except from HPI and PMH  Physical Exam BP 106/88 mmHg  Pulse 87  Ht 5\' 1"  (1.549 m)  Wt 145 lb (65.772 kg)  BMI 27.41 kg/m2 Well developed and well nourished in no acute distress HENT normal E scleral and icterus clear Neck Supple JVP flat; carotids brisk and full Clear to ausculation Device pocket well healed; without  hematoma or erythema.  There is no tethering  Regular rate and rhythm, no murmurs gallops or rub Soft with active bowel sounds No clubbing cyanosis  Edema Alert and oriented, grossly normal motor and sensory function Skin Warm and Dry  ECG demonstrates P synchronous pacing with a negative QRS in lead V1 and negative QRS in V1 this is unchanged from postoperative  Assessment and  Plan NICM  CHF Chronic systolic  CRT-Medtronic  We found that outputs were very high 2/2 autocapture reprogramming.  We reviewed the CXR and have reprogrammed the vector from 2-3>>>1-4 and shortened the AV delay Hopefully she will be improved

## 2016-01-22 NOTE — Patient Instructions (Signed)
Medication Instructions: 1) Increase coreg (carvedilol) to 25 mg one tablet by mouth twice daily 2) Decrease spironolactone to 25 mg 1/2 tablet (12.5 mg) by mouth once daily  Labwork: - Your physician recommends that you have lab work today: [employer]  Procedures/Testing: - none  Follow-Up: - Remote monitoring is used to monitor your Pacemaker of ICD from home. This monitoring reduces the number of office visits required to check your device to one time per year. It allows [place] to keep an eye on the functioning of your device to ensure it is working properly. You are scheduled for a device check from home on 04/22/16. You may send your transmission at any time that day. If you have a wireless device, the transmission will be sent automatically. After your physician reviews your transmission, you will receive a postcard with your next transmission date.  - Your physician wants you to follow-up in: 1 year with Dr. Caryl Comes. You will receive a reminder letter in the mail two months in advance. If you don't receive a letter, please call our office to schedule the follow-up appointment.   Any Additional Special Instructions Will Be Listed Below (If Applicable).     If you need a refill on your cardiac medications before your next appointment, please call your pharmacy.

## 2016-01-23 LAB — CUP PACEART INCLINIC DEVICE CHECK
Battery Remaining Longevity: 91 mo
Battery Voltage: 2.97 V
Brady Statistic AP VP Percent: 0.11 %
Brady Statistic AP VS Percent: 0.03 %
Brady Statistic AS VP Percent: 99.79 %
Brady Statistic AS VS Percent: 0.07 %
Brady Statistic RA Percent Paced: 0.14 %
Brady Statistic RV Percent Paced: 99.9 %
Date Time Interrogation Session: 20170517155809
HighPow Impedance: 66 Ohm
Implantable Lead Implant Date: 20170206
Implantable Lead Implant Date: 20170206
Implantable Lead Implant Date: 20170206
Implantable Lead Location: 753858
Implantable Lead Location: 753859
Implantable Lead Location: 753860
Implantable Lead Model: 4398
Implantable Lead Model: 5076
Lead Channel Impedance Value: 1045 Ohm
Lead Channel Impedance Value: 1102 Ohm
Lead Channel Impedance Value: 1387 Ohm
Lead Channel Impedance Value: 1482 Ohm
Lead Channel Impedance Value: 380 Ohm
Lead Channel Impedance Value: 494 Ohm
Lead Channel Impedance Value: 513 Ohm
Lead Channel Impedance Value: 608 Ohm
Lead Channel Impedance Value: 703 Ohm
Lead Channel Impedance Value: 817 Ohm
Lead Channel Impedance Value: 893 Ohm
Lead Channel Impedance Value: 931 Ohm
Lead Channel Impedance Value: 988 Ohm
Lead Channel Pacing Threshold Amplitude: 0.5 V
Lead Channel Pacing Threshold Amplitude: 0.5 V
Lead Channel Pacing Threshold Amplitude: 1.5 V
Lead Channel Pacing Threshold Pulse Width: 0.4 ms
Lead Channel Pacing Threshold Pulse Width: 0.4 ms
Lead Channel Pacing Threshold Pulse Width: 1 ms
Lead Channel Sensing Intrinsic Amplitude: 1 mV
Lead Channel Sensing Intrinsic Amplitude: 11.25 mV
Lead Channel Setting Pacing Amplitude: 1.5 V
Lead Channel Setting Pacing Amplitude: 2 V
Lead Channel Setting Pacing Amplitude: 2 V
Lead Channel Setting Pacing Pulse Width: 0.4 ms
Lead Channel Setting Pacing Pulse Width: 1 ms
Lead Channel Setting Sensing Sensitivity: 0.3 mV

## 2016-01-24 ENCOUNTER — Telehealth: Payer: Self-pay | Admitting: Internal Medicine

## 2016-01-24 ENCOUNTER — Ambulatory Visit (INDEPENDENT_AMBULATORY_CARE_PROVIDER_SITE_OTHER): Payer: Medicaid Other | Admitting: *Deleted

## 2016-01-24 ENCOUNTER — Encounter: Payer: Self-pay | Admitting: Internal Medicine

## 2016-01-24 DIAGNOSIS — Z95810 Presence of automatic (implantable) cardiac defibrillator: Secondary | ICD-10-CM

## 2016-01-24 NOTE — Telephone Encounter (Signed)
Returned patient's call.  She is agreeable to a device clinic appointment today at 10:00am for LV vector reprogramming for diaphragmatic stimulation.  Patient denies additional questions or concerns at this time and is appreciative of call.

## 2016-01-24 NOTE — Telephone Encounter (Signed)
New Message  Pt called states that it feels as if her heart is off beat. Almost like hiccups. Pt is requesting a remote transmission. Or to see if it can be adjusted from her home. Please call

## 2016-01-27 LAB — CUP PACEART INCLINIC DEVICE CHECK
Battery Remaining Longevity: 58 mo
Battery Voltage: 2.97 V
Brady Statistic AP VP Percent: 0.04 %
Brady Statistic AP VS Percent: 0 %
Brady Statistic AS VP Percent: 99.92 %
Brady Statistic AS VS Percent: 0.05 %
Brady Statistic RA Percent Paced: 0.04 %
Brady Statistic RV Percent Paced: 99.94 %
Date Time Interrogation Session: 20170519142746
HighPow Impedance: 87 Ohm
Implantable Lead Implant Date: 20170206
Implantable Lead Implant Date: 20170206
Implantable Lead Implant Date: 20170206
Implantable Lead Location: 753858
Implantable Lead Location: 753859
Implantable Lead Location: 753860
Implantable Lead Model: 4398
Implantable Lead Model: 5076
Lead Channel Impedance Value: 1045 Ohm
Lead Channel Impedance Value: 1102 Ohm
Lead Channel Impedance Value: 1216 Ohm
Lead Channel Impedance Value: 1273 Ohm
Lead Channel Impedance Value: 1368 Ohm
Lead Channel Impedance Value: 1482 Ohm
Lead Channel Impedance Value: 399 Ohm
Lead Channel Impedance Value: 513 Ohm
Lead Channel Impedance Value: 513 Ohm
Lead Channel Impedance Value: 608 Ohm
Lead Channel Impedance Value: 817 Ohm
Lead Channel Impedance Value: 874 Ohm
Lead Channel Impedance Value: 950 Ohm
Lead Channel Pacing Threshold Amplitude: 0.375 V
Lead Channel Pacing Threshold Amplitude: 0.5 V
Lead Channel Pacing Threshold Amplitude: 3.25 V
Lead Channel Pacing Threshold Pulse Width: 0.4 ms
Lead Channel Pacing Threshold Pulse Width: 0.4 ms
Lead Channel Pacing Threshold Pulse Width: 1 ms
Lead Channel Sensing Intrinsic Amplitude: 1.125 mV
Lead Channel Sensing Intrinsic Amplitude: 11.25 mV
Lead Channel Setting Pacing Amplitude: 3.5 V
Lead Channel Setting Pacing Amplitude: 3.5 V
Lead Channel Setting Pacing Amplitude: 3.75 V
Lead Channel Setting Pacing Pulse Width: 0.4 ms
Lead Channel Setting Pacing Pulse Width: 1 ms
Lead Channel Setting Sensing Sensitivity: 0.3 mV

## 2016-01-27 NOTE — Progress Notes (Signed)
CRT-D device check in office, added on for diaphragmatic stimulation. Auto thresholds and sensing consistent with previous device measurements. Lead impedance trends stable over time. Based on [employer]/manual testing, reprogrammed LV vector from LV1-LV4 to LV2-RV coil, threshold 3.25V@1 .4ms, output programmed to 3.75V@1 .77ms. Diaphragmatic stim noted with all LV1 options. No mode switch episodes recorded. No ventricular arrhythmia episodes recorded. Patient bi-ventricularly pacing 99.9% of the time. Device programmed with appropriate safety margins. Estimated longevity 7.6 years.  Patient enrolled in remote follow up. Patient education completed including shock plan. [facility]link on 04/22/16 as previously scheduled and ROV with SK in 12 months.

## 2016-01-28 ENCOUNTER — Telehealth: Payer: Self-pay | Admitting: Internal Medicine

## 2016-01-28 MED ORDER — CARVEDILOL 25 MG PO TABS: 25.0000 mg | ORAL_TABLET | Freq: Two times a day (BID) | ORAL | Status: DC

## 2016-01-28 MED ORDER — SPIRONOLACTONE 25 MG PO TABS
12.5000 mg | ORAL_TABLET | Freq: Every day | ORAL | Status: DC
Start: 2016-01-28 — End: 2016-03-06

## 2016-01-28 MED FILL — SPIRONOLACTONE 25 MG TABLET: 25 | 60 days supply | Qty: 30 | Fill #0

## 2016-01-28 MED FILL — CARVEDILOL 25 MG TABLET: 25 | 30 days supply | Qty: 60 | Fill #0

## 2016-01-28 NOTE — Telephone Encounter (Signed)
The patient was recently seen by Dr. Caryl Comes on 01/22/16:   Medication Instructions: 1) Increase coreg (carvedilol) to 25 mg one tablet by mouth twice daily 2) Decrease spironolactone to 25 mg 1/2 tablet (12.5 mg) by mouth once daily    I called and spoke with the patient- confirmed she needs her updated RX's sent to the [facility] for #30 day supply. She verbalizes this is correct. RX has been sent in for both medications.

## 2016-01-28 NOTE — Telephone Encounter (Signed)
New message      *STAT* If patient is at the pharmacy, call can be transferred to refill team.   1. Which medications need to be refilled? (please list name of each medication and dose if known) Carvedilol pt uncertain of strength takes it twice daily and Spironolactone takes 1/2 pill daily 2. Which pharmacy/location (including street and city if local pharmacy) is medication to be sent to? [facility]  3. Do they need a 30 day or 90 day supply? 30 days   The pt is switching pharmacy's so the two prescriptions needs to be called in.

## 2016-01-30 ENCOUNTER — Telehealth: Payer: Self-pay | Admitting: *Deleted

## 2016-01-30 NOTE — Telephone Encounter (Signed)
Patient called in stating that she has been much more tired "since those changes were made".  She reports sleeping much more often.  Advised patient that the reprogramming performed last week was for diaphragmatic stimulation and should not affect how she feels overall.  Advised patient that she can send a manual transmission from her home monitor and we can review it to ensure that there are no abnormalities/alerts.  Patient states she is not home at this time, but that she will call later today or tomorrow morning for assistance with sending a manual transmission.  Patient denies SOB, chest discomfort, or dizziness.  She is appreciative of assistance and denies additional questions or concerns at this time.

## 2016-02-04 ENCOUNTER — Encounter: Payer: Self-pay | Admitting: Cardiology

## 2016-02-04 ENCOUNTER — Telehealth (HOSPITAL_COMMUNITY): Payer: Self-pay | Admitting: *Deleted

## 2016-02-04 NOTE — Telephone Encounter (Signed)
Spoke to patient earlier regarding her sx's. Patient stated that she had noticed recent increase in her ShOB. She stated that her sx's began after she ate a hotdog. Patient denies having an overall high sodium diet. Patient was asked to send a remote transmission for further review.   Transmission was reviewed: Spoke to patient about remote transmission. I informed her that all of her lead measurements were stable and that she didn't have any episodes. Patient voiced understanding and stated that she had to spoke to Dr.McLean's nurse who said that he recommended that she start a diuretic. I told patient to call if she has any further issues. Patient voiced understanding.

## 2016-02-04 NOTE — Telephone Encounter (Signed)
Spoke w/pt, she states her legs are swollen.  She stats she has been drinking extra fluid.  She is on Lasix 20 mg QOD and increased to daily today, advised this was ok for a few days if not improving she will call us back, she will watch fluid intake

## 2016-02-04 NOTE — Telephone Encounter (Signed)
Pt left a VM earlier today c/o increased fluid, swelling in LE.  Attempted to call her back and Left message to call back

## 2016-02-25 MED FILL — ENTRESTO 24 MG-26 MG TABLET: 24-26 | 30 days supply | Qty: 60 | Fill #2

## 2016-03-06 ENCOUNTER — Encounter (HOSPITAL_COMMUNITY): Payer: Self-pay

## 2016-03-06 ENCOUNTER — Ambulatory Visit (HOSPITAL_COMMUNITY)
Admission: RE | Admit: 2016-03-06 | Discharge: 2016-03-06 | Disposition: A | Discharge status: Home / Self Care | Payer: Medicaid Other | Source: Ambulatory Visit | Attending: Cardiology | Admitting: Cardiology

## 2016-03-06 VITALS — BP 120/74 | HR 80 | Ht 61.0 in | Wt 145.1 lb

## 2016-03-06 DIAGNOSIS — D573 Sickle-cell trait: Secondary | ICD-10-CM | POA: Diagnosis not present

## 2016-03-06 DIAGNOSIS — K219 Gastro-esophageal reflux disease without esophagitis: Secondary | ICD-10-CM | POA: Insufficient documentation

## 2016-03-06 DIAGNOSIS — I5022 Chronic systolic (congestive) heart failure: Secondary | ICD-10-CM | POA: Diagnosis not present

## 2016-03-06 DIAGNOSIS — J45909 Unspecified asthma, uncomplicated: Secondary | ICD-10-CM | POA: Diagnosis not present

## 2016-03-06 DIAGNOSIS — Z79899 Other long term (current) drug therapy: Secondary | ICD-10-CM | POA: Insufficient documentation

## 2016-03-06 DIAGNOSIS — I428 Other cardiomyopathies: Secondary | ICD-10-CM | POA: Insufficient documentation

## 2016-03-06 DIAGNOSIS — Z95810 Presence of automatic (implantable) cardiac defibrillator: Secondary | ICD-10-CM | POA: Diagnosis not present

## 2016-03-06 MED ORDER — SPIRONOLACTONE 25 MG PO TABS: 25.0000 mg | ORAL_TABLET | Freq: Every day | ORAL | Status: DC

## 2016-03-06 MED FILL — CARVEDILOL 25 MG TABLET: 25 | 30 days supply | Qty: 60 | Fill #1

## 2016-03-06 NOTE — Progress Notes (Signed)
Patient ID: Mary Pratt, female   DOB: 02/04/1971, 45 y.o.   MRN: [id number]    Advanced Heart Failure Clinic Note   PCP: Dr. Gar Ponto Cardiology: Dr. [name]  45 yo presents for evaluation of nonischemic dilated cardiomyopathy.  She developed exertional dyspnea and orthopnea in 5/16.  She had had no prior known cardiac problems.  She was admitted in 6/16 with CHF.  Echo showed EF 20% with severe LV dilation.  Coronary angiography showed normal coronaries.  RHC showed preserved cardiac output.  She was diuresed and started on cardiac meds. Repeat echo in 12/16 showed EF 30% with septal-lateral dyssynchrony.  She had Medtronic CRT-D system placed in 2/17.   Today she returns for HF follow up.Feeling good overall.  Last month had vector and AV delay adjusted on her device and has felt better since then.  Can get around a grocery store without SOB. Usually doesn't have DOE on flat ground, unless she hurries. She is short of breath walking up a flight of stairs. She is still waiting on disability, no longer working.  Denies chest pain. Energy level is OK, but still with some generalized fatigue.  If she has a busy day, she'll have to rest the next 1-2 days. No orthopnea/PND. Occasionally has swelling in her legs.   Labs (6/16): K 4.5, creatinine 1.11, TSH normal Labs (7/16): K 3.8, creatinine 1.13, BNP 353, SPEP negative Labs (8/16): K 4.2, creatinine 1.11 Labs (05/22/2015): K 3.8 Creatinine 1.01  Labs (11/16): digoxin 0.5, K 4, creatinine 1.11 Labs (12/16): K 4, creatinine 1.11, digoxin 0.5 Labs (2/17): K 4.3, creatinine 1.08  PMH: 1. GERD 2. Asthma 3. Sickle cell trait 4. H/o H pylori gastritis 5. Cardiomyopathy: Nonischemic.  Echo (6/16) with EF 20%, severe LV dilation, mild MR, PA systolic pressure 54 mmHg.  LHC/RHC (6/16) with EF 15%, CI 2.56 Fick/2.12 thermo, mean RA 1, PA 39/13 mean 26, mean PCWP 12, normal coronaries. cMRI (7/16) with moderately dilated LV with EF 31%, prominent LV  septal-lateral dyssynchrony, normal RV size and systolic function, no myocardial LGE, so no definitive evidence for prior MI, Myocarditis, or infiltrative disease.  CPX (9/16) with peak VO2 15.9, VE/VCO2 slope 23.9, RER 1.09 => moderate functional impairment.  Echo (12/16) with EF 30%, moderate LVH, septal-lateral dyssynchrony, normal RV size and systolic function.  - Medtronic CRT-D placed 2/17.   SH: Lives with son in [place].  Nonsmoker.  No ETOH or drugs. Used to work at [employer]  Divorced.   FH: No known heart disease.   ROS: All systems reviewed and negative except as per HPI.   Current Outpatient Prescriptions  Medication Sig Dispense Refill  . albuterol (PROVENTIL HFA;VENTOLIN HFA) 108 (90 BASE) MCG/ACT inhaler Inhale into the lungs every 6 (six) hours as needed for wheezing or shortness of breath.    . carvedilol (COREG) 25 MG tablet Take 1 tablet (25 mg total) by mouth 2 (two) times daily. 60 tablet 11  . clonazePAM (KLONOPIN) 0.5 MG tablet Take 0.5 mg by mouth 2 (two) times daily as needed for anxiety.     . digoxin (LANOXIN) 0.125 MG tablet TAKE ONE TABLET BY MOUTH ONCE DAILY 30 tablet 3  . DULoxetine (CYMBALTA) 60 MG capsule Take 60 mg by mouth 2 (two) times daily.     . fexofenadine (ALLEGRA) 180 MG tablet Take 180 mg by mouth daily.    . Fluticasone-Salmeterol (ADVAIR DISKUS) 250-50 MCG/DOSE AEPB Inhale 1 puff into the lungs 2 (two) times daily.  180 each 4  . furosemide (LASIX) 20 MG tablet Take 1 tablet (20 mg total) by mouth every other day. 15 tablet 4  . pantoprazole (PROTONIX) 40 MG tablet Take 40 mg by mouth 2 (two) times daily.     [name] 80 MCG/ACT AERS Place 1 spray into the nose daily as needed (allergic rhinitis).     . sacubitril-valsartan (ENTRESTO) 24-26 MG Take 1 tablet by mouth 2 (two) times daily. 60 tablet 6  . spironolactone (ALDACTONE) 25 MG tablet Take 0.5 tablets (12.5 mg total) by mouth daily. 30 tablet 6  . traZODone (DESYREL) 50 MG tablet Take 50 mg by mouth  at bedtime as needed for sleep.    Marland Kitchen HYDROcodone-acetaminophen (NORCO/VICODIN) 5-325 MG tablet Take 1 tablet by mouth every 6 (six) hours as needed. Reported on 03/06/2016  0   No current facility-administered medications for this encounter.   BP 120/74 mmHg  Pulse 80  Ht 5\' 1"  (1.549 m)  Wt 145 lb 1.9 oz (65.826 kg)  BMI 27.43 kg/m2   Wt Readings from Last 3 Encounters:  03/06/16 145 lb 1.9 oz (65.826 kg)  01/22/16 145 lb (65.772 kg)  12/05/15 144 lb 12.8 oz (65.681 kg)    General: NAD. Ambulated in the clinic without difficulty.  Neck: No JVD, no thyromegaly or thyroid nodule.  Lungs: Clear to auscultation bilaterally with normal respiratory effort. CV: Nondisplaced PMI.  Heart regular S1/S2, no S3/S4, no murmur.  No peripheral edema.  No carotid bruit.  Normal pedal pulses.  Abdomen: Soft, nontender, no hepatosplenomegaly, no distention.  Skin: Intact without lesions or rashes.  Neurologic: Alert and oriented x 3.  Psych: Normal affect. Extremities: No clubbing or cyanosis.  HEENT: Normal.   Assessment/Plan: Chronic systolic CHF: Nonischemic cardiomyopathy.  Etiology uncertain: normal coronaries, no history of ETOH or drug abuse, no family history of cardiomyopathy.  No prior history of HTN.  Possible that it is related to viral myocarditis.   cMRI in 7/16 showed some improvement of EF to 31%.  Also showed LV dyssynchrony.  CPX in 9/16 showed a moderate functional limitation from CHF.  Stable NYHA class III symptoms. Volume status stable. Most recent echo in 12/16 with EF 30%, septal-lateral dyssynchrony.  She had Medtronic CRT-D placed in 2/17.  - Continue lasix 20 mg every other day.  BMET today. - Continue Coreg 18.75 mg bid.  No BP room to increase.  - Continue digoxin 0.125 mg daily, check level.  -Continue current dose of Entresto.  No BP room to increase. - Continue spironolactone 25 daily.  - Repeat echo in 8/17 to look for improvement with CRT.   Followup in 3  months.  Satira Mccallum Tillery  03/06/2016   Patient seen with PA, agree with the above note.  Overall doing pretty well.  Given soft BP at times, will hold off on medication titration today.  Check BMET and digoxin level.  I will arrange for an echo in 8/17 to look for improvement with CRT.    [name] 03/08/2016

## 2016-03-06 NOTE — Patient Instructions (Signed)
Labs needed in 10 days INCREASE Spironolactone to 25 mg, one tab daily  Your physician has requested that you have an echocardiogram. Echocardiography is a painless test that uses sound waves to create images of your heart. It provides your doctor with information about the size and shape of your heart and how well your heart's chambers and valves are working. This procedure takes approximately one hour. There are no restrictions for this procedure.  Your physician recommends that you schedule a follow-up appointment in: 3 months with Dr [name]  Do the following things EVERYDAY: 1) Weigh yourself in the morning before breakfast. Write it down and keep it in a log. 2) Take your medicines as prescribed 3) Eat low salt foods-Limit salt (sodium) to 2000 mg per day.  4) Stay as active as you can everyday 5) Limit all fluids for the day to less than 2 liters 6)

## 2016-03-16 ENCOUNTER — Other Ambulatory Visit: Payer: Self-pay | Admitting: Internal Medicine

## 2016-03-18 MED FILL — SPIRONOLACTONE 25 MG TABLET: 25 | 30 days supply | Qty: 30 | Fill #0

## 2016-03-20 ENCOUNTER — Ambulatory Visit (HOSPITAL_COMMUNITY)
Admission: RE | Admit: 2016-03-20 | Discharge: 2016-03-20 | Disposition: A | Discharge status: Home / Self Care | Payer: Medicaid Other | Source: Ambulatory Visit | Attending: Cardiology | Admitting: Cardiology

## 2016-03-20 DIAGNOSIS — I5022 Chronic systolic (congestive) heart failure: Secondary | ICD-10-CM | POA: Insufficient documentation

## 2016-03-20 DIAGNOSIS — I5023 Acute on chronic systolic (congestive) heart failure: Secondary | ICD-10-CM

## 2016-03-20 LAB — BASIC METABOLIC PANEL
Anion gap: 7 (ref 5–15)
BUN: 12 mg/dL (ref 6–20)
CO2: 27 mmol/L (ref 22–32)
Calcium: 9.2 mg/dL (ref 8.9–10.3)
Chloride: 103 mmol/L (ref 101–111)
Creatinine, Ser: 0.98 mg/dL (ref 0.44–1.00)
GFR calc Af Amer: >60 mL/min (ref >60)
GFR calc non Af Amer: >60 mL/min (ref >60)
Glucose, Bld: 106 mg/dL — ABNORMAL HIGH (ref 65–99)
Potassium: 3.9 mmol/L (ref 3.5–5.1)
Sodium: 137 mmol/L (ref 135–145)

## 2016-03-20 LAB — DIGOXIN LEVEL: Digoxin Level: 0.9 ng/mL (ref 0.8–2.0)

## 2016-03-31 ENCOUNTER — Telehealth (HOSPITAL_COMMUNITY): Payer: Self-pay

## 2016-03-31 NOTE — Telephone Encounter (Signed)
Patient called CHF clinic triage line asking for update on [employer] [place] form to state that she is permanently disabled. Advised that previous form from April staed she was marked disabled until next f/u apt (3 months). Asked for patient to fax in another form and will review with Dr. [name] to see what is appropriate for patient regarding work status/disability. Aware and agreeable, fax # confirmed with patient over the phone, patient states she will fax this form today.  Renee Pain

## 2016-04-06 MED FILL — CARVEDILOL 25 MG TABLET: 25 | 30 days supply | Qty: 60 | Fill #2

## 2016-04-06 MED FILL — ENTRESTO 24 MG-26 MG TABLET: 24-26 | 30 days supply | Qty: 60 | Fill #3

## 2016-04-09 ENCOUNTER — Telehealth (HOSPITAL_COMMUNITY): Payer: Self-pay

## 2016-04-15 ENCOUNTER — Other Ambulatory Visit: Payer: Self-pay

## 2016-04-15 ENCOUNTER — Ambulatory Visit (HOSPITAL_COMMUNITY): Payer: Medicaid Other | Attending: Cardiology

## 2016-04-15 DIAGNOSIS — I428 Other cardiomyopathies: Secondary | ICD-10-CM | POA: Diagnosis not present

## 2016-04-15 DIAGNOSIS — I509 Heart failure, unspecified: Secondary | ICD-10-CM | POA: Diagnosis present

## 2016-04-15 DIAGNOSIS — I429 Cardiomyopathy, unspecified: Secondary | ICD-10-CM | POA: Diagnosis not present

## 2016-04-15 DIAGNOSIS — I5022 Chronic systolic (congestive) heart failure: Secondary | ICD-10-CM | POA: Diagnosis not present

## 2016-04-15 DIAGNOSIS — I340 Nonrheumatic mitral (valve) insufficiency: Secondary | ICD-10-CM | POA: Insufficient documentation

## 2016-04-15 DIAGNOSIS — J45909 Unspecified asthma, uncomplicated: Secondary | ICD-10-CM | POA: Diagnosis not present

## 2016-04-15 DIAGNOSIS — I071 Rheumatic tricuspid insufficiency: Secondary | ICD-10-CM | POA: Diagnosis not present

## 2016-04-15 DIAGNOSIS — I517 Cardiomegaly: Secondary | ICD-10-CM | POA: Insufficient documentation

## 2016-04-15 DIAGNOSIS — D573 Sickle-cell trait: Secondary | ICD-10-CM | POA: Diagnosis not present

## 2016-04-15 DIAGNOSIS — Z95810 Presence of automatic (implantable) cardiac defibrillator: Secondary | ICD-10-CM | POA: Diagnosis not present

## 2016-04-17 NOTE — Telephone Encounter (Signed)
Paperwork completed for permanent disability per Dr. Claris Gladden recommendation and patient request. Attempted to reach patient by phone to determine if she would like to pick up paperwork, have mailed, or faxed, no answer.  Left VM to return our call to clarify which way she would like to receive paperwork or have it delivered on her behalf from our office.  Renee Pain, RN

## 2016-04-22 ENCOUNTER — Telehealth: Payer: Self-pay | Admitting: *Deleted

## 2016-04-22 ENCOUNTER — Encounter: Payer: Medicaid Other | Admitting: *Deleted

## 2016-04-22 ENCOUNTER — Telehealth: Payer: Self-pay | Admitting: Cardiology

## 2016-04-22 DIAGNOSIS — Z95810 Presence of automatic (implantable) cardiac defibrillator: Secondary | ICD-10-CM

## 2016-04-22 NOTE — Telephone Encounter (Signed)
Spoke with pt and reminded pt of remote transmission that is due today. Pt verbalized understanding.   

## 2016-04-22 NOTE — Telephone Encounter (Signed)
Patient returned call.  Advised her of Dr. Olin Pia recommendations.  Patient is agreeable to CXR at [facility] on 04/28/16 and appointment with device clinic on 04/30/16 at 3:00pm for possible vector reprogramming per Dr. Olin Pia recommendation.  Patient is aware that she does not need to send remote transmission (was scheduled for today) at this point as she is coming into the office next week.  She verbalizes understanding of all instructions and denies questions or concerns at this time.

## 2016-04-22 NOTE — Telephone Encounter (Signed)
[facility] requesting call back from patient.  Gave device clinic phone number.  Per Dr. Caryl Comes, after reviewing device check from 01/24/16, patient should have a CXR and schedule a device clinic appointment on a day that he is here for potential LV vector reprogramming (for enhanced CRT).

## 2016-04-24 ENCOUNTER — Encounter: Payer: Self-pay | Admitting: Cardiology

## 2016-04-24 MED FILL — SPIRONOLACTONE 25 MG TABLET: 25 | 30 days supply | Qty: 30 | Fill #1

## 2016-04-28 ENCOUNTER — Ambulatory Visit (HOSPITAL_COMMUNITY)
Admission: RE | Admit: 2016-04-28 | Discharge: 2016-04-28 | Disposition: A | Discharge status: Home / Self Care | Payer: Medicaid Other | Source: Ambulatory Visit | Attending: Internal Medicine | Admitting: Internal Medicine

## 2016-04-28 DIAGNOSIS — Z95810 Presence of automatic (implantable) cardiac defibrillator: Secondary | ICD-10-CM | POA: Diagnosis not present

## 2016-04-28 MED FILL — ENTRESTO 24 MG-26 MG TABLET: 24-26 | 30 days supply | Qty: 60 | Fill #4

## 2016-04-28 MED FILL — CARVEDILOL 25 MG TABLET: 25 | 30 days supply | Qty: 60 | Fill #3

## 2016-04-30 ENCOUNTER — Ambulatory Visit (INDEPENDENT_AMBULATORY_CARE_PROVIDER_SITE_OTHER): Payer: Medicaid Other | Admitting: *Deleted

## 2016-04-30 ENCOUNTER — Encounter: Payer: Self-pay | Admitting: Internal Medicine

## 2016-04-30 DIAGNOSIS — Z95810 Presence of automatic (implantable) cardiac defibrillator: Secondary | ICD-10-CM

## 2016-04-30 LAB — CUP PACEART INCLINIC DEVICE CHECK
Battery Remaining Longevity: 53 mo
Battery Voltage: 2.97 V
Brady Statistic AP VP Percent: 0.11 %
Brady Statistic AP VS Percent: 0.01 %
Brady Statistic AS VP Percent: 99.76 %
Brady Statistic AS VS Percent: 0.12 %
Brady Statistic RA Percent Paced: 0.12 %
Brady Statistic RV Percent Paced: 99.65 %
Date Time Interrogation Session: 20170824153244
HighPow Impedance: 69 Ohm
Implantable Lead Implant Date: 20170206
Implantable Lead Implant Date: 20170206
Implantable Lead Implant Date: 20170206
Implantable Lead Location: 753858
Implantable Lead Location: 753859
Implantable Lead Location: 753860
Implantable Lead Model: 4398
Implantable Lead Model: 5076
Lead Channel Impedance Value: 1102 Ohm
Lead Channel Impedance Value: 1102 Ohm
Lead Channel Impedance Value: 1121 Ohm
Lead Channel Impedance Value: 1159 Ohm
Lead Channel Impedance Value: 1425 Ohm
Lead Channel Impedance Value: 1444 Ohm
Lead Channel Impedance Value: 437 Ohm
Lead Channel Impedance Value: 513 Ohm
Lead Channel Impedance Value: 570 Ohm
Lead Channel Impedance Value: 646 Ohm
Lead Channel Impedance Value: 779 Ohm
Lead Channel Impedance Value: 817 Ohm
Lead Channel Impedance Value: 836 Ohm
Lead Channel Sensing Intrinsic Amplitude: 0.875 mV
Lead Channel Sensing Intrinsic Amplitude: 1.875 mV
Lead Channel Sensing Intrinsic Amplitude: 10.25 mV
Lead Channel Sensing Intrinsic Amplitude: 11.125 mV
Lead Channel Setting Pacing Amplitude: 1.5 V
Lead Channel Setting Pacing Amplitude: 2 V
Lead Channel Setting Pacing Amplitude: 3.75 V
Lead Channel Setting Pacing Pulse Width: 0.4 ms
Lead Channel Setting Pacing Pulse Width: 1 ms
Lead Channel Setting Sensing Sensitivity: 0.3 mV

## 2016-04-30 NOTE — Progress Notes (Signed)
CRTD check in device clinic today for CXR review and vector reprogramming for enhanced CRT per SK. Recent Echo shows improvement in EF 55-60%. CXR shows no marked change in LV lead placement. Patient reports minimal and non-disruptive diaphragmatic stimulation. Per SK- no changes today. [facility] 08/03/16, ROV with SK in May.

## 2016-05-06 ENCOUNTER — Ambulatory Visit (HOSPITAL_COMMUNITY)
Admission: RE | Admit: 2016-05-06 | Discharge: 2016-05-06 | Disposition: A | Discharge status: Home / Self Care | Payer: Medicaid Other | Source: Ambulatory Visit | Attending: Cardiology | Admitting: Cardiology

## 2016-05-06 ENCOUNTER — Encounter (HOSPITAL_COMMUNITY): Payer: Self-pay

## 2016-05-06 DIAGNOSIS — I5022 Chronic systolic (congestive) heart failure: Secondary | ICD-10-CM | POA: Insufficient documentation

## 2016-05-06 LAB — BASIC METABOLIC PANEL
Anion gap: 9 (ref 5–15)
BUN: 8 mg/dL (ref 6–20)
CO2: 27 mmol/L (ref 22–32)
Calcium: 9.2 mg/dL (ref 8.9–10.3)
Chloride: 102 mmol/L (ref 101–111)
Creatinine, Ser: 1.13 mg/dL — ABNORMAL HIGH (ref 0.44–1.00)
GFR calc Af Amer: >60 mL/min (ref >60)
GFR calc non Af Amer: 57 mL/min — ABNORMAL LOW (ref >60)
Glucose, Bld: 119 mg/dL — ABNORMAL HIGH (ref 65–99)
Potassium: 3.4 mmol/L — ABNORMAL LOW (ref 3.5–5.1)
Sodium: 138 mmol/L (ref 135–145)

## 2016-05-06 LAB — BRAIN NATRIURETIC PEPTIDE: B Natriuretic Peptide: 7.1 pg/mL (ref 0.0–100.0)

## 2016-05-06 LAB — MAGNESIUM: Magnesium: 1.9 mg/dL (ref 1.7–2.4)

## 2016-05-06 NOTE — Progress Notes (Signed)
Per Dr. [name], most recent echo shows patient's functionality improved and from CHF standpoint no longer eligible/considered permanently disabled. Patient made aware.  Renee Pain, RN

## 2016-05-09 ENCOUNTER — Emergency Department (HOSPITAL_COMMUNITY)
Admission: EM | Admit: 2016-05-09 | Discharge: 2016-05-09 | Disposition: A | Discharge status: Home / Self Care | Payer: Medicaid Other | Attending: Emergency Medicine | Admitting: Emergency Medicine

## 2016-05-09 ENCOUNTER — Encounter (HOSPITAL_COMMUNITY): Payer: Self-pay

## 2016-05-09 DIAGNOSIS — Z7951 Long term (current) use of inhaled steroids: Secondary | ICD-10-CM | POA: Insufficient documentation

## 2016-05-09 DIAGNOSIS — Z79899 Other long term (current) drug therapy: Secondary | ICD-10-CM | POA: Insufficient documentation

## 2016-05-09 DIAGNOSIS — M79662 Pain in left lower leg: Secondary | ICD-10-CM | POA: Diagnosis present

## 2016-05-09 DIAGNOSIS — J45909 Unspecified asthma, uncomplicated: Secondary | ICD-10-CM | POA: Diagnosis not present

## 2016-05-09 DIAGNOSIS — M79661 Pain in right lower leg: Secondary | ICD-10-CM | POA: Diagnosis not present

## 2016-05-09 DIAGNOSIS — I110 Hypertensive heart disease with heart failure: Secondary | ICD-10-CM | POA: Diagnosis not present

## 2016-05-09 DIAGNOSIS — I5022 Chronic systolic (congestive) heart failure: Secondary | ICD-10-CM | POA: Diagnosis not present

## 2016-05-09 DIAGNOSIS — M79606 Pain in leg, unspecified: Secondary | ICD-10-CM

## 2016-05-09 MED ORDER — MAGNESIUM OXIDE 400 (241.3 MG) MG PO TABS: 400.0000 mg | ORAL_TABLET | Freq: Every day | ORAL | 0 refills | Status: DC

## 2016-05-09 MED ORDER — POTASSIUM CHLORIDE CRYS ER 20 MEQ PO TBCR: 20.0000 meq | EXTENDED_RELEASE_TABLET | Freq: Two times a day (BID) | ORAL | 0 refills | Status: DC

## 2016-05-09 MED ORDER — POTASSIUM CHLORIDE CRYS ER 20 MEQ PO TBCR
40.0000 meq | EXTENDED_RELEASE_TABLET | Freq: Once | ORAL | Status: AC
  Administered 2016-05-09: 40 meq via ORAL
  Filled 2016-05-09: qty 2

## 2016-05-09 NOTE — ED Provider Notes (Signed)
[place] [facility] DEPT Provider Note   CSN: [id number] Arrival date & time: 05/09/16  0432     History   Chief Complaint Chief Complaint  Patient presents with  . Leg Pain    HPI Mary Pratt is a 45 y.o. female.   Leg Pain   This is a recurrent problem. The current episode started more than 2 days ago. The problem occurs constantly. The problem has been gradually worsening. The pain is present in the right lower leg and left lower leg. The quality of the pain is described as aching and intermittent. The pain is mild. Associated symptoms include stiffness. Pertinent negatives include no numbness, full range of motion and no tingling. She has tried nothing for the symptoms. The treatment provided no relief. There has been no history of extremity trauma.    Past Medical History:  Diagnosis Date  . 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 Anxiety   . Asthma   . Chronic systolic CHF (congestive heart failure) ([place])    a. 02/2015 Echo: EF 20%, sev dil w/ diff HK, worse @ inf base. Tiv AI, mild MR, mod dil LA, PASP 35mmHg.  . Functional dyspepsia    early satiety   . GERD with stricture    dilated 2010  . HELICOBACTER PYLORI GASTRITIS 01/15/2009   dyspnea, ? reaction to Biaxin/amoxicillin  Pylera 01/15/09 - completed therapy     . NICM (nonischemic cardiomyopathy) ([place])    a. 02/2015 Cath: nl cors, EF 15%, mild PAH;  b. 02/2015 Enrolled in VEST trial.  . Sickle cell trait ([place])   . Situational depression    "son died"    Patient Active Problem List   Diagnosis Date Noted  . Chronic systolic heart failure ([place]) 06/12/2015  . Nonischemic cardiomyopathy ([place]) 02/27/2015  . Essential hypertension 02/27/2015  . Cardiomyopathy ([place])   . Acute systolic heart failure ([place]) 02/22/2015  . Functional dyspepsia 01/23/2011  . Asthma 12/28/2007  . GERD with stricture 12/28/2007    Past Surgical History:  Procedure Laterality Date  . CARDIAC CATHETERIZATION N/A 02/26/2015   Procedure: Right/Left Heart Cath and Coronary Angiography;  Surgeon: Troy Sine, MD;  Location: [facility];  Service: Cardiovascular;  Laterality: N/A;  . COLPOSCOPY  2013   neg  . EP IMPLANTABLE DEVICE N/A 10/14/2015   Procedure: BiV ICD Insertion CRT-D;  Surgeon: Deboraha Sprang, MD;  Location: [facility];  Service: Cardiovascular;  Laterality: N/A;  . ESOPHAGOGASTRODUODENOSCOPY (EGD) WITH ESOPHAGEAL DILATION  11/30/2008   esophageal ring dilated 90 French, H. pylori gastritis  . TUBAL LIGATION  1991  . WISDOM TOOTH EXTRACTION      OB History    Gravida Para Term Preterm AB Living   3 3 3     2    SAB TAB Ectopic Multiple Live Births           3       Home Medications    Prior to Admission medications   Medication Sig Start Date End Date Taking? Authorizing Provider  albuterol (PROVENTIL HFA;VENTOLIN HFA) 108 (90 BASE) MCG/ACT inhaler Inhale into the lungs every 6 (six) hours as needed for wheezing or shortness of breath.    Historical Provider, MD  carvedilol (COREG) 25 MG tablet Take 1 tablet (25 mg total) by mouth 2 (two) times daily. 01/28/16   Deboraha Sprang, MD  digoxin (LANOXIN) 0.125 MG tablet TAKE ONE TABLET BY MOUTH ONCE DAILY  03/16/16   Jolaine Artist, MD  fexofenadine (ALLEGRA) 180 MG tablet Take 180 mg by mouth 2 (two) times daily.     Historical Provider, MD  Fluticasone-Salmeterol (ADVAIR DISKUS) 250-50 MCG/DOSE AEPB Inhale 1 puff into the lungs 2 (two) times daily. 03/23/13   Kathee Delton, MD  furosemide (LASIX) 20 MG tablet Take 1 tablet (20 mg total) by mouth every other day. Patient taking differently: Take 20 mg by mouth daily.  12/04/15   Larey Dresser, MD  HYDROcodone-acetaminophen (NORCO/VICODIN) 5-325 MG tablet Take 1 tablet by mouth every 6 (six) hours as needed. Reported on 03/06/2016 10/15/15   Historical Provider, MD  magnesium oxide (MAG-OX) 400 (241.3 Mg) MG tablet Take 1 tablet (400 mg total) by mouth daily. 05/09/16   Merrily Pew, MD    pantoprazole (PROTONIX) 40 MG tablet Take 40 mg by mouth 2 (two) times daily.  01/31/15   Historical Provider, MD  potassium chloride SA (K-DUR,KLOR-CON) 20 MEQ tablet Take 1 tablet (20 mEq total) by mouth 2 (two) times daily. 05/09/16   Merrily Pew, MD  QNASL 80 MCG/ACT AERS Place 1 spray into the nose daily as needed (allergic rhinitis).  09/18/14   Historical Provider, MD  sacubitril-valsartan (ENTRESTO) 24-26 MG Take 1 tablet by mouth 2 (two) times daily. 10/07/15   Larey Dresser, MD  spironolactone (ALDACTONE) 25 MG tablet Take 1 tablet (25 mg total) by mouth daily. 03/06/16   Larey Dresser, MD  traZODone (DESYREL) 50 MG tablet Take 50 mg by mouth at bedtime as needed for sleep.    Historical Provider, MD    Family History Family History  Problem Relation Age of Onset  . Diabetes Mother   . Hypertension Mother   . Hypothyroidism Mother   . Breast cancer Maternal Aunt   . Brain cancer Maternal Uncle   . Lung cancer Maternal Grandmother   . Colon cancer Neg Hx   . Colon polyps Neg Hx   . Esophageal cancer Neg Hx     Social History Social History  Substance Use Topics  . Smoking status: Never Smoker  . Smokeless tobacco: Never Used  . Alcohol use No     Allergies   Amoxicillin and Clarithromycin   Review of Systems Review of Systems  Constitutional: Negative for chills and fever.  Respiratory: Negative for cough and shortness of breath.   Musculoskeletal: Positive for myalgias and stiffness. Negative for arthralgias.  Neurological: Negative for tingling and numbness.  All other systems reviewed and are negative.    Physical Exam Updated Vital Signs BP 160/94 (BP Location: Left Arm)   Pulse 66   Temp 98.3 F (36.8 C) (Oral)   Resp 18   Ht 5\' 1"  (1.549 m)   Wt 149 lb (67.6 kg)   LMP 04/05/2016   SpO2 97%   BMI 28.15 kg/m   Physical Exam  Constitutional: She appears well-developed and well-nourished. No distress.  HENT:  Head: Normocephalic and  atraumatic.  Eyes: Conjunctivae are normal.  Neck: Neck supple.  Cardiovascular: Normal rate and regular rhythm.   No murmur heard. Pulmonary/Chest: Effort normal and breath sounds normal. No respiratory distress.  Abdominal: Soft. There is no tenderness.  Musculoskeletal: She exhibits no edema.  Neurological: She is alert.  Skin: Skin is warm and dry.  Psychiatric: She has a normal mood and affect.  Nursing note and vitals reviewed.    ED Treatments / Results  Labs (all labs ordered are listed, but only abnormal  results are displayed) Labs Reviewed - No data to display  EKG  EKG Interpretation None       Radiology No results found.  Procedures Procedures (including critical care time)  Medications Ordered in ED Medications  potassium chloride SA (K-DUR,KLOR-CON) CR tablet 40 mEq (not administered)     Initial Impression / Assessment and Plan / ED Course  I have reviewed the triage vital signs and the nursing notes.  Pertinent labs & imaging results that were available during my care of the patient were reviewed by me and considered in my medical decision making (see chart for details).  Clinical Course    No evidence or RF of DVT. No e/o fluid overload. Recently found to be hypokalemic, likely cause. Will start k/mg and have pcp follow up.   Final Clinical Impressions(s) / ED Diagnoses   Final diagnoses:  Pain of lower extremity, unspecified laterality    New Prescriptions New Prescriptions   MAGNESIUM OXIDE (MAG-OX) 400 (241.3 MG) MG TABLET    Take 1 tablet (400 mg total) by mouth daily.   POTASSIUM CHLORIDE SA (K-DUR,KLOR-CON) 20 MEQ TABLET    Take 1 tablet (20 mEq total) by mouth 2 (two) times daily.     Merrily Pew, MD 05/09/16 [phone]

## 2016-05-09 NOTE — ED Triage Notes (Signed)
Pt states she has chf, has swelling to her lower legs at times, states she has recently been having pain in both legs x 1 week

## 2016-05-09 NOTE — ED Notes (Signed)
Patient verbalizes understanding of discharge instructions, prescriptions, home care and follow up care. Patient out of department at this time. 

## 2016-05-09 NOTE — ED Notes (Signed)
Patient ambulatory into room with a steady gait. No deficit noted with ambulation.

## 2016-05-12 ENCOUNTER — Telehealth (HOSPITAL_COMMUNITY): Payer: Self-pay | Admitting: *Deleted

## 2016-05-12 NOTE — Telephone Encounter (Signed)
Patient came to the clinic Thursday inquiring about disability paper work she dropped off. Patient said she called the triage line several times and no one returned her call.  Patient wanted to see Jinny Blossom about her paperwork but Jinny Blossom was in clinic and could not speak with her.  I told her I would go and look for it. Heather told me she remembered our office receiving it. At this point the patient had been here almost an hour I told her I would find something out and have Jinny Blossom call her when we received her lab results. After the patient left I spoke with Jinny Blossom who stated that Dr.McLean was not going to fill out the patients paperwork so she discarded it. Patient had not been notified that her paperwork was not going to be filled out. Patient left another voice message today on the triage line requesting a call back from [place] only.   *Message routed to [employer]

## 2016-05-12 NOTE — Telephone Encounter (Signed)
Left return VM for patient explaining that Dr. [name] did not feel patient was permanently disabled from CHF perspective and that documentation supporting permanent disability would have to be submitted, and because her EF has improved this would not support permanent disability claim. Also, explained on VM that paperwork was discarded because it was already marked permanently disabled, therefor unable to alter or white out spaces to redo the paperwork for short term disability, etc. Advised she is welcome to refax [place] blank forms to do for short term disability if Dr. [name] thinks this is more appropriate. Fax number given on VM.  Renee Pain, RN

## 2016-05-15 ENCOUNTER — Telehealth (HOSPITAL_COMMUNITY): Payer: Self-pay | Admitting: *Deleted

## 2016-05-15 MED ORDER — POTASSIUM CHLORIDE CRYS ER 20 MEQ PO TBCR: 20.0000 meq | EXTENDED_RELEASE_TABLET | Freq: Two times a day (BID) | ORAL | 6 refills | Status: DC

## 2016-05-15 MED ORDER — FUROSEMIDE 20 MG PO TABS: 20.0000 mg | ORAL_TABLET | Freq: Every day | ORAL | 6 refills | Status: DC

## 2016-05-15 MED ORDER — MAGNESIUM OXIDE 400 (241.3 MG) MG PO TABS: 400.0000 mg | ORAL_TABLET | Freq: Every day | ORAL | 6 refills | Status: DC

## 2016-05-15 NOTE — Telephone Encounter (Signed)
Angelinne called to let us know she had a form faxed to [place] from [employer] [place].  Advised we did receive the forms and we will contact when they are completed and faxed.    She also request refill on K-dur, mag-ox and lasix which she states she is taking daily now.  Refills sent in

## 2016-05-18 ENCOUNTER — Other Ambulatory Visit (HOSPITAL_COMMUNITY): Payer: Self-pay | Admitting: *Deleted

## 2016-05-18 MED ORDER — MAGNESIUM OXIDE 400 (241.3 MG) MG PO TABS: 400.0000 mg | ORAL_TABLET | Freq: Every day | ORAL | 6 refills | Status: DC

## 2016-05-18 MED ORDER — POTASSIUM CHLORIDE CRYS ER 20 MEQ PO TBCR: 20.0000 meq | EXTENDED_RELEASE_TABLET | Freq: Two times a day (BID) | ORAL | 6 refills | Status: DC

## 2016-06-04 ENCOUNTER — Encounter (HOSPITAL_COMMUNITY): Payer: Medicaid Other

## 2016-06-05 MED FILL — ENTRESTO 24 MG-26 MG TABLET: 24-26 | 30 days supply | Qty: 60 | Fill #5

## 2016-06-05 MED FILL — CARVEDILOL 25 MG TABLET: 25 | 30 days supply | Qty: 60 | Fill #4

## 2016-06-05 MED FILL — SPIRONOLACTONE 25 MG TABLET: 25 | 30 days supply | Qty: 30 | Fill #2

## 2016-06-30 ENCOUNTER — Encounter (HOSPITAL_COMMUNITY): Payer: Self-pay

## 2016-06-30 ENCOUNTER — Ambulatory Visit (HOSPITAL_COMMUNITY)
Admission: RE | Admit: 2016-06-30 | Discharge: 2016-06-30 | Disposition: A | Discharge status: Home / Self Care | Payer: Medicaid Other | Source: Ambulatory Visit | Attending: Cardiology | Admitting: Cardiology

## 2016-06-30 VITALS — BP 132/90 | HR 70 | Wt 148.5 lb

## 2016-06-30 DIAGNOSIS — I5022 Chronic systolic (congestive) heart failure: Secondary | ICD-10-CM

## 2016-06-30 LAB — BASIC METABOLIC PANEL
Anion gap: 6 (ref 5–15)
BUN: 9 mg/dL (ref 6–20)
CO2: 26 mmol/L (ref 22–32)
Calcium: 9.3 mg/dL (ref 8.9–10.3)
Chloride: 105 mmol/L (ref 101–111)
Creatinine, Ser: 1.14 mg/dL — ABNORMAL HIGH (ref 0.44–1.00)
GFR calc Af Amer: >60 mL/min (ref >60)
GFR calc non Af Amer: 57 mL/min — ABNORMAL LOW (ref >60)
Glucose, Bld: 109 mg/dL — ABNORMAL HIGH (ref 65–99)
Potassium: 4.5 mmol/L (ref 3.5–5.1)
Sodium: 137 mmol/L (ref 135–145)

## 2016-06-30 NOTE — Patient Instructions (Signed)
Stop Digoxin.  Routine lab work today. Will notify you of abnormal results.  Follow up with Dr.McLean in 6 months.

## 2016-06-30 NOTE — Progress Notes (Signed)
Patient ID: Mary Pratt, female   DOB: September 17, 1970, 45 y.o.   MRN: [id number]    Advanced Heart Failure Clinic Note   PCP: Dr. Gar Ponto Cardiology: Dr. [name]  45 yo presents for evaluation of nonischemic dilated cardiomyopathy.  She developed exertional dyspnea and orthopnea in 5/16.  She had had no prior known cardiac problems.  She was admitted in 6/16 with CHF.  Echo showed EF 20% with severe LV dilation.  Coronary angiography showed normal coronaries.  RHC showed preserved cardiac output.  She was diuresed and started on cardiac meds. Repeat echo in 12/16 showed EF 30% with septal-lateral dyssynchrony.  She had Medtronic CRT-D system placed in 2/17.  Most recent echo in 8/17 showed recovery of LV function, EF 55-60%.    She is doing well currently.  She denies exertional dyspnea.  Still fatigues easily.  Wants to go to [facility] to exericse.  No lightheadedness/dizziness.  No chest pain.  No orthopnea/PND.   Labs (6/16): K 4.5, creatinine 1.11, TSH normal Labs (7/16): K 3.8, creatinine 1.13, BNP 353, SPEP negative Labs (8/16): K 4.2, creatinine 1.11 Labs (05/22/2015): K 3.8 Creatinine 1.01  Labs (11/16): digoxin 0.5, K 4, creatinine 1.11 Labs (12/16): K 4, creatinine 1.11, digoxin 0.5 Labs (2/17): K 4.3, creatinine 1.08 Labs (7/17): digoxin 0.9 Labs (8/17): K 3.4, creatinine 1.13, BNP 7.1  PMH: 1. GERD 2. Asthma 3. Sickle cell trait 4. H/o H pylori gastritis 5. Cardiomyopathy: Nonischemic.  Echo (6/16) with EF 20%, severe LV dilation, mild MR, PA systolic pressure 54 mmHg.  LHC/RHC (6/16) with EF 15%, CI 2.56 Fick/2.12 thermo, mean RA 1, PA 39/13 mean 26, mean PCWP 12, normal coronaries. cMRI (7/16) with moderately dilated LV with EF 31%, prominent LV septal-lateral dyssynchrony, normal RV size and systolic function, no myocardial LGE, so no definitive evidence for prior MI, Myocarditis, or infiltrative disease.  CPX (9/16) with peak VO2 15.9, VE/VCO2 slope 23.9, RER 1.09 => moderate  functional impairment.  Echo (12/16) with EF 30%, moderate LVH, septal-lateral dyssynchrony, normal RV size and systolic function.  - Medtronic CRT-D placed 2/17.  - Echo (8/17) with EF 55-60%, mild LVH.   SH: Lives with son in [place].  Nonsmoker.  No ETOH or drugs. Used to work at [employer]  Divorced.   FH: No known heart disease.   ROS: All systems reviewed and negative except as per HPI.   Current Outpatient Prescriptions  Medication Sig Dispense Refill  . albuterol (PROVENTIL HFA;VENTOLIN HFA) 108 (90 BASE) MCG/ACT inhaler Inhale into the lungs every 6 (six) hours as needed for wheezing or shortness of breath.    . carvedilol (COREG) 25 MG tablet Take 1 tablet (25 mg total) by mouth 2 (two) times daily. 60 tablet 11  . fexofenadine (ALLEGRA) 180 MG tablet Take 180 mg by mouth 2 (two) times daily.     . Fluticasone-Salmeterol (ADVAIR DISKUS) 250-50 MCG/DOSE AEPB Inhale 1 puff into the lungs 2 (two) times daily. 180 each 4  . furosemide (LASIX) 20 MG tablet Take 1 tablet (20 mg total) by mouth daily. 30 tablet 6  . magnesium oxide (MAG-OX) 400 (241.3 Mg) MG tablet Take 1 tablet (400 mg total) by mouth daily. 30 tablet 6  . pantoprazole (PROTONIX) 40 MG tablet Take 40 mg by mouth 2 (two) times daily.     . potassium chloride SA (K-DUR,KLOR-CON) 20 MEQ tablet Take 1 tablet (20 mEq total) by mouth 2 (two) times daily. 60 tablet 6  . QNASL 80  MCG/ACT AERS Place 1 spray into the nose daily as needed (allergic rhinitis).     . sacubitril-valsartan (ENTRESTO) 24-26 MG Take 1 tablet by mouth 2 (two) times daily. 60 tablet 6  . spironolactone (ALDACTONE) 25 MG tablet Take 1 tablet (25 mg total) by mouth daily. 30 tablet 6  . traZODone (DESYREL) 50 MG tablet Take 50 mg by mouth at bedtime as needed for sleep.    Marland Kitchen HYDROcodone-acetaminophen (NORCO/VICODIN) 5-325 MG tablet Take 1 tablet by mouth every 6 (six) hours as needed. Reported on 03/06/2016  0   No current facility-administered medications for this  encounter.    BP 132/90   Pulse 70   Wt 148 lb 8 oz (67.4 kg)   SpO2 99%   BMI 28.06 kg/m    Wt Readings from Last 3 Encounters:  06/30/16 148 lb 8 oz (67.4 kg)  05/09/16 149 lb (67.6 kg)  03/06/16 145 lb 1.9 oz (65.8 kg)    General: NAD. Ambulated in the clinic without difficulty.  Neck: No JVD, no thyromegaly or thyroid nodule.  Lungs: Clear to auscultation bilaterally with normal respiratory effort. CV: Nondisplaced PMI.  Heart regular S1/S2, no S3/S4, no murmur.  No peripheral edema.  No carotid bruit.  Normal pedal pulses.  Abdomen: Soft, nontender, no hepatosplenomegaly, no distention.  Skin: Intact without lesions or rashes.  Neurologic: Alert and oriented x 3.  Psych: Normal affect. Extremities: No clubbing or cyanosis.  HEENT: Normal.   Assessment/Plan: Chronic systolic CHF: Nonischemic cardiomyopathy.  Etiology uncertain: normal coronaries, no history of ETOH or drug abuse, no family history of cardiomyopathy.  No prior history of HTN.  Possible that it is related to viral myocarditis.   cMRI in 7/16 showed some improvement of EF to 31%.  Also showed LV dyssynchrony.  CPX in 9/16 showed a moderate functional limitation from CHF.  She had Medtronic CRT-D placed in 2/17. She had a good response to CRT with repeat echo in 8/17 showing EF 55-60%.  NYHA class II symptoms, euvolemic on exam.  - Continue lasix 20 mg every other day.  BMET today. - Continue Coreg, Entresto, spironolactone at current doses.  - With recovery of EF, she can stop digoxin.    Followup in 6 months.  [name]  06/30/2016

## 2016-07-09 MED FILL — CARVEDILOL 25 MG TABLET: 25 | 30 days supply | Qty: 60 | Fill #5

## 2016-07-09 MED FILL — ENTRESTO 24 MG-26 MG TABLET: 24-26 | 30 days supply | Qty: 60 | Fill #6

## 2016-07-09 MED FILL — SPIRONOLACTONE 25 MG TABLET: 25 | 30 days supply | Qty: 30 | Fill #3

## 2016-08-03 ENCOUNTER — Ambulatory Visit (INDEPENDENT_AMBULATORY_CARE_PROVIDER_SITE_OTHER): Payer: Medicaid Other | Admitting: *Deleted

## 2016-08-03 ENCOUNTER — Telehealth: Payer: Self-pay | Admitting: Cardiology

## 2016-08-03 DIAGNOSIS — I428 Other cardiomyopathies: Secondary | ICD-10-CM | POA: Diagnosis not present

## 2016-08-03 NOTE — Telephone Encounter (Signed)
Spoke with pt and reminded pt of remote transmission that is due today. Pt verbalized understanding.   

## 2016-08-04 NOTE — Progress Notes (Signed)
Remote ICD transmission.   

## 2016-08-05 ENCOUNTER — Ambulatory Visit: Payer: Medicaid Other | Admitting: Podiatry

## 2016-08-06 ENCOUNTER — Encounter: Payer: Self-pay | Admitting: Cardiology

## 2016-08-06 MED FILL — CARVEDILOL 25 MG TABLET: 25 | 30 days supply | Qty: 60 | Fill #6

## 2016-08-06 MED FILL — SPIRONOLACTONE 25 MG TABLET: 25 | 30 days supply | Qty: 30 | Fill #4

## 2016-08-21 ENCOUNTER — Encounter: Payer: Self-pay | Admitting: Cardiology

## 2016-08-21 LAB — CUP PACEART REMOTE DEVICE CHECK
Battery Remaining Longevity: 50 mo
Battery Voltage: 2.96 V
Brady Statistic AP VP Percent: 0.04 %
Brady Statistic AP VS Percent: 0 %
Brady Statistic AS VP Percent: 99.91 %
Brady Statistic AS VS Percent: 0.05 %
Brady Statistic RA Percent Paced: 0.04 %
Brady Statistic RV Percent Paced: 99.72 %
Date Time Interrogation Session: 20171127205303
HighPow Impedance: 64 Ohm
Implantable Lead Implant Date: 20170206
Implantable Lead Implant Date: 20170206
Implantable Lead Implant Date: 20170206
Implantable Lead Location: 753858
Implantable Lead Location: 753859
Implantable Lead Location: 753860
Implantable Lead Model: 4398
Implantable Lead Model: 5076
Implantable Pulse Generator Implant Date: 20170206
Lead Channel Impedance Value: 1045 Ohm
Lead Channel Impedance Value: 1064 Ohm
Lead Channel Impedance Value: 1064 Ohm
Lead Channel Impedance Value: 1159 Ohm
Lead Channel Impedance Value: 1311 Ohm
Lead Channel Impedance Value: 1425 Ohm
Lead Channel Impedance Value: 399 Ohm
Lead Channel Impedance Value: 494 Ohm
Lead Channel Impedance Value: 646 Ohm
Lead Channel Impedance Value: 665 Ohm
Lead Channel Impedance Value: 760 Ohm
Lead Channel Impedance Value: 817 Ohm
Lead Channel Impedance Value: 817 Ohm
Lead Channel Pacing Threshold Amplitude: 0.375 V
Lead Channel Pacing Threshold Amplitude: 0.375 V
Lead Channel Pacing Threshold Pulse Width: 0.4 ms
Lead Channel Pacing Threshold Pulse Width: 0.4 ms
Lead Channel Sensing Intrinsic Amplitude: 1.5 mV
Lead Channel Sensing Intrinsic Amplitude: 1.5 mV
Lead Channel Sensing Intrinsic Amplitude: 10.375 mV
Lead Channel Sensing Intrinsic Amplitude: 10.375 mV
Lead Channel Setting Pacing Amplitude: 1.5 V
Lead Channel Setting Pacing Amplitude: 2 V
Lead Channel Setting Pacing Amplitude: 3.75 V
Lead Channel Setting Pacing Pulse Width: 0.4 ms
Lead Channel Setting Pacing Pulse Width: 1 ms
Lead Channel Setting Sensing Sensitivity: 0.3 mV

## 2016-08-28 ENCOUNTER — Encounter (HOSPITAL_COMMUNITY): Payer: Self-pay

## 2016-08-28 NOTE — Progress Notes (Signed)
Patient dropped off paperwork for [employer] [place] for financial claim stating patient is totally disabled. However, Dr. Claris Gladden last notes state patient is mobile with recovered EF. Will review with Dr. [name] before completing forms.  Renee Pain, RN

## 2016-09-01 ENCOUNTER — Encounter (HOSPITAL_COMMUNITY): Payer: Self-pay | Admitting: Pharmacist

## 2016-09-17 ENCOUNTER — Telehealth: Payer: Self-pay | Admitting: Licensed Clinical Social Worker

## 2016-09-17 ENCOUNTER — Telehealth (HOSPITAL_COMMUNITY): Payer: Self-pay

## 2016-09-17 NOTE — Telephone Encounter (Signed)
CSW attempted to reach patient by phone to follow with no answer. CSW left message for return call. Raquel Sarna, LCSW [phone]

## 2016-09-17 NOTE — Telephone Encounter (Signed)
CSW received return call from patient who states she is not working and needs assistance with completion of disability papers to "get help". CSW explained that patient has improved cardiac function as noted by MD and RN. Patient understands and then requests asisstance with medications. CSW spoke with pharmacy who reports patient assistance program in process as patient's current insurance does not cover prescribed med. Patient verbalizes understanding of follow up and assistance with medications. Raquel Sarna, LCSW [phone]

## 2016-09-17 NOTE — Telephone Encounter (Signed)
Patient left message to follow up on [employer] statement exception form. Form requesting CHF clinic provider to mark "totally disabled" for up to 6 months to attest patient unable to work. However, Dr. [name] has cleared patient from heart failure perspective as her ejection fraction and cardiac functionality has fully recovered. Patient denies this and states she is still totally disabled. This RN has explained to patient along with Dr. [name] on numerous occassions past that from heart failure perspective she is not totally disabled, and that she may take these forms to PCP for further evaluation for disability from alternate diagnosis. Will forward to CHF SW Raquel Sarna to see if she can assist in this matter.  Renee Pain, RN

## 2016-09-18 ENCOUNTER — Telehealth (HOSPITAL_COMMUNITY): Payer: Self-pay | Admitting: *Deleted

## 2016-09-18 NOTE — Telephone Encounter (Signed)
Patient came by office for [facility] 24-26mg  samples.  Medication Samples have been provided to the patient.  Drug name: [name]      Strength: 24-26mg        Qty: 2   LOT: [id number]  Exp.Date: 10/18  Dosing instructions: Take 1 Tablet by mouth Twice Daily  The patient has been instructed regarding the correct time, dose, and frequency of taking this medication, including desired effects and most common side effects.   [name] 4:19 PM 09/18/2016

## 2016-09-25 ENCOUNTER — Telehealth (HOSPITAL_COMMUNITY): Payer: Self-pay | Admitting: Pharmacist

## 2016-09-25 NOTE — Telephone Encounter (Signed)
[employer] patient assistance approved for [employer] through 09/22/17.   Ruta Hinds. Velva Harman, PharmD, BCPS, CPP Clinical Pharmacist Pager: [phone] Phone: [phone] 09/25/2016 10:12 AM

## 2016-11-02 ENCOUNTER — Ambulatory Visit (INDEPENDENT_AMBULATORY_CARE_PROVIDER_SITE_OTHER): Payer: Self-pay | Admitting: *Deleted

## 2016-11-02 DIAGNOSIS — I428 Other cardiomyopathies: Secondary | ICD-10-CM

## 2016-11-02 NOTE — Progress Notes (Signed)
Remote ICD transmission.   

## 2016-11-03 ENCOUNTER — Encounter: Payer: Self-pay | Admitting: Cardiology

## 2016-11-04 LAB — CUP PACEART REMOTE DEVICE CHECK
Battery Remaining Longevity: 45 mo
Battery Voltage: 2.96 V
Brady Statistic AP VP Percent: 0.02 %
Brady Statistic AP VS Percent: 0 %
Brady Statistic AS VP Percent: 99.94 %
Brady Statistic AS VS Percent: 0.04 %
Brady Statistic RA Percent Paced: 0.02 %
Brady Statistic RV Percent Paced: 99.89 %
Date Time Interrogation Session: 20180226164448
HighPow Impedance: 68 Ohm
Implantable Lead Implant Date: 20170206
Implantable Lead Implant Date: 20170206
Implantable Lead Implant Date: 20170206
Implantable Lead Location: 753858
Implantable Lead Location: 753859
Implantable Lead Location: 753860
Implantable Lead Model: 4398
Implantable Lead Model: 5076
Implantable Pulse Generator Implant Date: 20170206
Lead Channel Impedance Value: 1045 Ohm
Lead Channel Impedance Value: 1102 Ohm
Lead Channel Impedance Value: 1121 Ohm
Lead Channel Impedance Value: 1273 Ohm
Lead Channel Impedance Value: 1330 Ohm
Lead Channel Impedance Value: 456 Ohm
Lead Channel Impedance Value: 494 Ohm
Lead Channel Impedance Value: 513 Ohm
Lead Channel Impedance Value: 608 Ohm
Lead Channel Impedance Value: 703 Ohm
Lead Channel Impedance Value: 722 Ohm
Lead Channel Impedance Value: 760 Ohm
Lead Channel Impedance Value: 950 Ohm
Lead Channel Pacing Threshold Amplitude: 0.5 V
Lead Channel Pacing Threshold Amplitude: 0.5 V
Lead Channel Pacing Threshold Amplitude: 4.75 V
Lead Channel Pacing Threshold Pulse Width: 0.4 ms
Lead Channel Pacing Threshold Pulse Width: 0.4 ms
Lead Channel Pacing Threshold Pulse Width: 1 ms
Lead Channel Sensing Intrinsic Amplitude: 1.625 mV
Lead Channel Sensing Intrinsic Amplitude: 1.625 mV
Lead Channel Sensing Intrinsic Amplitude: 11.625 mV
Lead Channel Sensing Intrinsic Amplitude: 11.625 mV
Lead Channel Setting Pacing Amplitude: 1.5 V
Lead Channel Setting Pacing Amplitude: 2 V
Lead Channel Setting Pacing Amplitude: 3.75 V
Lead Channel Setting Pacing Pulse Width: 0.4 ms
Lead Channel Setting Pacing Pulse Width: 1 ms
Lead Channel Setting Sensing Sensitivity: 0.3 mV

## 2016-11-18 ENCOUNTER — Encounter: Payer: Self-pay | Admitting: Cardiology

## 2016-11-18 ENCOUNTER — Other Ambulatory Visit (HOSPITAL_COMMUNITY): Payer: Self-pay | Admitting: Cardiology

## 2016-11-18 DIAGNOSIS — I5022 Chronic systolic (congestive) heart failure: Secondary | ICD-10-CM

## 2017-02-04 ENCOUNTER — Encounter (HOSPITAL_COMMUNITY): Payer: Self-pay

## 2017-02-16 ENCOUNTER — Ambulatory Visit (HOSPITAL_COMMUNITY)
Admission: RE | Admit: 2017-02-16 | Discharge: 2017-02-16 | Disposition: A | Discharge status: Home / Self Care | Payer: Self-pay | Source: Ambulatory Visit | Attending: Internal Medicine | Admitting: Internal Medicine

## 2017-02-16 VITALS — BP 102/62 | HR 76 | Wt 156.8 lb

## 2017-02-16 DIAGNOSIS — I5022 Chronic systolic (congestive) heart failure: Secondary | ICD-10-CM | POA: Insufficient documentation

## 2017-02-16 DIAGNOSIS — Z7951 Long term (current) use of inhaled steroids: Secondary | ICD-10-CM | POA: Insufficient documentation

## 2017-02-16 DIAGNOSIS — R5383 Other fatigue: Secondary | ICD-10-CM | POA: Insufficient documentation

## 2017-02-16 DIAGNOSIS — Z79899 Other long term (current) drug therapy: Secondary | ICD-10-CM | POA: Insufficient documentation

## 2017-02-16 DIAGNOSIS — Z95810 Presence of automatic (implantable) cardiac defibrillator: Secondary | ICD-10-CM | POA: Insufficient documentation

## 2017-02-16 DIAGNOSIS — K219 Gastro-esophageal reflux disease without esophagitis: Secondary | ICD-10-CM | POA: Insufficient documentation

## 2017-02-16 DIAGNOSIS — I428 Other cardiomyopathies: Secondary | ICD-10-CM | POA: Insufficient documentation

## 2017-02-16 DIAGNOSIS — J45909 Unspecified asthma, uncomplicated: Secondary | ICD-10-CM | POA: Insufficient documentation

## 2017-02-16 DIAGNOSIS — D573 Sickle-cell trait: Secondary | ICD-10-CM | POA: Insufficient documentation

## 2017-02-16 LAB — BRAIN NATRIURETIC PEPTIDE: B Natriuretic Peptide: 6.3 pg/mL (ref 0.0–100.0)

## 2017-02-16 LAB — BASIC METABOLIC PANEL
Anion gap: 5 (ref 5–15)
BUN: 9 mg/dL (ref 6–20)
CO2: 27 mmol/L (ref 22–32)
Calcium: 9.4 mg/dL (ref 8.9–10.3)
Chloride: 104 mmol/L (ref 101–111)
Creatinine, Ser: 1.24 mg/dL — ABNORMAL HIGH (ref 0.44–1.00)
GFR calc Af Amer: 59 mL/min — ABNORMAL LOW (ref >60)
GFR calc non Af Amer: 51 mL/min — ABNORMAL LOW (ref >60)
Glucose, Bld: 93 mg/dL (ref 65–99)
Potassium: 3.8 mmol/L (ref 3.5–5.1)
Sodium: 136 mmol/L (ref 135–145)

## 2017-02-16 LAB — CBC
HCT: 35.4 % — ABNORMAL LOW (ref 36.0–46.0)
Hemoglobin: 11.9 g/dL — ABNORMAL LOW (ref 12.0–15.0)
MCH: 28.6 pg (ref 26.0–34.0)
MCHC: 33.6 g/dL (ref 30.0–36.0)
MCV: 85.1 fL (ref 78.0–100.0)
Platelets: 232 10*3/uL (ref 150–400)
RBC: 4.16 MIL/uL (ref 3.87–5.11)
RDW: 14.3 % (ref 11.5–15.5)
WBC: 4.9 10*3/uL (ref 4.0–10.5)

## 2017-02-16 LAB — TSH: TSH: 0.568 u[IU]/mL (ref 0.350–4.500)

## 2017-02-16 NOTE — Progress Notes (Signed)
Advanced Heart Failure Medication Review by a Pharmacist  Does the patient  feel that his/her medications are working for him/her?  yes  Has the patient been experiencing any side effects to the medications prescribed?  no  Does the patient measure his/her own blood pressure or blood glucose at home?  no   Does the patient have any problems obtaining medications due to transportation or finances?   No - no longer has active Medicaid coverage, now using [employer] and [employer] patient assistance   Understanding of regimen: good Understanding of indications: good Potential of compliance: good Patient understands to avoid NSAIDs. Patient understands to avoid decongestants.  Issues to address at subsequent visits: none.   Pharmacist comments: Mary Pratt is a pleasant 46 y/o F who presented alone without medication bottles or list but with good recollection of her medications. She reports good adherence to her medications and presents with no medication-related questions or concerns for me at this time.   Phillis Knack PharmD Candidate  Time with patient: 7 minutes Preparation and documentation time: 15 minutes Total time: 22 minutes

## 2017-02-16 NOTE — Patient Instructions (Signed)
Labs today (will call for abnormal results, otherwise no news is good news)  Echocardiogram has been ordered for you, we will schedule this for you at checkout.   Follow up with Dr. [name] in 6 months, we will contact you to schedule appointment.

## 2017-02-16 NOTE — Progress Notes (Signed)
Patient ID: Mary Pratt, female   DOB: Oct 09, 1970, 46 y.o.   MRN: [id number]    Advanced Heart Failure Clinic Note   PCP: Dr. Gar Ponto Cardiology: Dr. [name]  46 yo presents for evaluation of nonischemic dilated cardiomyopathy.  She developed exertional dyspnea and orthopnea in 5/16.  She had had no prior known cardiac problems.  She was admitted in 6/16 with CHF.  Echo showed EF 20% with severe LV dilation.  Coronary angiography showed normal coronaries.  RHC showed preserved cardiac output.  She was diuresed and started on cardiac meds. Repeat echo in 12/16 showed EF 30% with septal-lateral dyssynchrony.  She had Medtronic CRT-D system placed in 2/17.  Most recent echo in 8/17 showed recovery of LV function, EF 55-60%.    She presents today for regular follow up. Still with occasional fatigue. More so than normal over past month. Has gained some weight in the past month or two. Has been taking lasix 20 mg daily instead of every other day. SOB walking up steps. Not exercising currently.  Denies CP. No lightheadedness with climbing or from bending over and getting back up.  Denies orthopnea or PND. Taking all medications as directed. No ETOH or smoking.   Labs (7/17): digoxin 0.9 Labs (8/17): K 3.4, creatinine 1.13, BNP 7.1  PMH: 1. GERD 2. Asthma 3. Sickle cell trait 4. H/o H pylori gastritis 5. Cardiomyopathy: Nonischemic.  Echo (6/16) with EF 20%, severe LV dilation, mild MR, PA systolic pressure 54 mmHg.  LHC/RHC (6/16) with EF 15%, CI 2.56 Fick/2.12 thermo, mean RA 1, PA 39/13 mean 26, mean PCWP 12, normal coronaries. cMRI (7/16) with moderately dilated LV with EF 31%, prominent LV septal-lateral dyssynchrony, normal RV size and systolic function, no myocardial LGE, so no definitive evidence for prior MI, Myocarditis, or infiltrative disease.  CPX (9/16) with peak VO2 15.9, VE/VCO2 slope 23.9, RER 1.09 => moderate functional impairment.  Echo (12/16) with EF 30%, moderate LVH,  septal-lateral dyssynchrony, normal RV size and systolic function.  - Medtronic CRT-D placed 2/17.  - Echo (8/17) with EF 55-60%, mild LVH.   SH: Lives with son in [place].  Nonsmoker.  No ETOH or drugs. Used to work at [employer]  Divorced.   FH: No known heart disease.   Review of systems complete and found to be negative unless listed in HPI.    Current Outpatient Prescriptions  Medication Sig Dispense Refill  . albuterol (PROVENTIL HFA;VENTOLIN HFA) 108 (90 BASE) MCG/ACT inhaler Inhale into the lungs every 6 (six) hours as needed for wheezing or shortness of breath.    . carvedilol (COREG) 25 MG tablet Take 1 tablet (25 mg total) by mouth 2 (two) times daily. 60 tablet 11  . citalopram (CELEXA) 20 MG tablet Take 20 mg by mouth daily.    Marland Kitchen dexlansoprazole (DEXILANT) 60 MG capsule Take 60 mg by mouth daily.    . fexofenadine (ALLEGRA) 180 MG tablet Take 180 mg by mouth 2 (two) times daily.     . Fluticasone-Salmeterol (ADVAIR DISKUS) 250-50 MCG/DOSE AEPB Inhale 1 puff into the lungs 2 (two) times daily. 180 each 4  . furosemide (LASIX) 20 MG tablet Take 1 tablet (20 mg total) by mouth daily. 30 tablet 6  . hydrOXYzine (ATARAX/VISTARIL) 25 MG tablet Take 25 mg by mouth 3 (three) times daily.    . magnesium oxide (MAG-OX) 400 (241.3 Mg) MG tablet Take 1 tablet (400 mg total) by mouth daily. 30 tablet 6  . potassium chloride  SA (K-DUR,KLOR-CON) 20 MEQ tablet Take 1 tablet (20 mEq total) by mouth 2 (two) times daily. 60 tablet 6  . QNASL 80 MCG/ACT AERS Place 1 spray into the nose daily as needed (allergic rhinitis).     . sacubitril-valsartan (ENTRESTO) 24-26 MG Take 1 tablet by mouth 2 (two) times daily. 60 tablet 6  . spironolactone (ALDACTONE) 25 MG tablet Take 1 tablet (25 mg total) by mouth daily. 30 tablet 6  . traZODone (DESYREL) 50 MG tablet Take 50 mg by mouth at bedtime as needed for sleep.     No current facility-administered medications for this encounter.    BP 102/62 (BP Location:  Left Arm, Patient Position: Sitting, Cuff Size: Normal)   Pulse 76   Wt 156 lb 12.8 oz (71.1 kg)   SpO2 98%   BMI 29.63 kg/m    Wt Readings from Last 3 Encounters:  02/16/17 156 lb 12.8 oz (71.1 kg)  06/30/16 148 lb 8 oz (67.4 kg)  05/09/16 149 lb (67.6 kg)   General: Well appearing. No resp difficulty. HEENT: normal Neck: supple. JVP 5-6. Carotids 2+ bilat; no bruits. No thyromegaly or nodule noted. Cor: PMI nondisplaced. RRR, No M/G/R noted Lungs: CTAB, normal effort. Abdomen: soft, non-tender, distended, no HSM. No bruits or masses. +BS  Extremities: no cyanosis, clubbing, rash, R and LLE no edema.  Neuro: alert & orientedx3, cranial nerves grossly intact. moves all 4 extremities w/o difficulty. Affect pleasant   Assessment/Plan: 1. Chronic systolic CHF: Nonischemic cardiomyopathy.  Etiology uncertain: normal coronaries, no history of ETOH or drug abuse, no family history of cardiomyopathy.  No prior history of HTN.  Possible that it is related to viral myocarditis.   cMRI in 7/16 showed some improvement of EF to 31%.  Also showed LV dyssynchrony.  CPX in 9/16 showed a moderate functional limitation from CHF.  She had Medtronic CRT-D placed in 2/17. She had a good response to CRT with repeat echo in 8/17 showing EF 55-60%.   - NYHA Class II symptoms.  - Volume status stable on exam.  exam.  - Continue lasix 20 mg every other day.  BMET today.  - Continue Coreg 6.25 mg BID - Continue Entresto 24/26 mg BID.  - Continue spironolactone 25 mg BID - Reinforced fluid restriction to < 2 L daily, sodium restriction to less than 2000 mg daily, and the importance of daily weights.    2. Fatigue - Unlikely that her EF has dropped with med compliance, but will re-check Echo.  - Will also send CBC and TSH.  - Encouraged to resume exercise.   Followup in 6 months. Repeat Echo. Labs as above. No med changes with improved EF and relatively soft pressure.   Shirley Friar, PA-C    02/16/2017

## 2017-02-24 ENCOUNTER — Telehealth (HOSPITAL_COMMUNITY): Payer: Self-pay | Admitting: *Deleted

## 2017-02-24 NOTE — Telephone Encounter (Signed)
Pt called stating she lost her Medicaid and she is no longer able to afford her Carvedilol or Arlyce Harman, she states other meds she is getting from [facility] program but that program will not cover Carvedilol or Arlyce Harman.  Advised will send rx for these meds to [facility].  Pt agreeable, also advised Kennyth Lose, CSW will f/u with her she is agreeable

## 2017-02-25 MED FILL — CARVEDILOL 25 MG TABLET: 25 | 34 days supply | Qty: 68 | Fill #0

## 2017-02-25 MED FILL — SPIRONOLACTONE 25 MG TABLET: 25 | 34 days supply | Qty: 34 | Fill #0

## 2017-02-27 ENCOUNTER — Emergency Department (HOSPITAL_COMMUNITY)
Admission: EM | Admit: 2017-02-27 | Discharge: 2017-02-27 | Disposition: A | Discharge status: Home / Self Care | Payer: Self-pay | Attending: Emergency Medicine | Admitting: Emergency Medicine

## 2017-02-27 ENCOUNTER — Emergency Department (HOSPITAL_COMMUNITY): Payer: Self-pay

## 2017-02-27 ENCOUNTER — Encounter (HOSPITAL_COMMUNITY): Payer: Self-pay | Admitting: Emergency Medicine

## 2017-02-27 DIAGNOSIS — I110 Hypertensive heart disease with heart failure: Secondary | ICD-10-CM | POA: Insufficient documentation

## 2017-02-27 DIAGNOSIS — R079 Chest pain, unspecified: Secondary | ICD-10-CM | POA: Insufficient documentation

## 2017-02-27 DIAGNOSIS — M7989 Other specified soft tissue disorders: Secondary | ICD-10-CM | POA: Insufficient documentation

## 2017-02-27 DIAGNOSIS — R0602 Shortness of breath: Secondary | ICD-10-CM | POA: Insufficient documentation

## 2017-02-27 DIAGNOSIS — R0609 Other forms of dyspnea: Secondary | ICD-10-CM

## 2017-02-27 DIAGNOSIS — J45909 Unspecified asthma, uncomplicated: Secondary | ICD-10-CM | POA: Insufficient documentation

## 2017-02-27 DIAGNOSIS — R0600 Dyspnea, unspecified: Secondary | ICD-10-CM

## 2017-02-27 DIAGNOSIS — I5022 Chronic systolic (congestive) heart failure: Secondary | ICD-10-CM | POA: Insufficient documentation

## 2017-02-27 LAB — COMPREHENSIVE METABOLIC PANEL
ALT: 24 U/L (ref 14–54)
AST: 27 U/L (ref 15–41)
Albumin: 4 g/dL (ref 3.5–5.0)
Alkaline Phosphatase: 69 U/L (ref 38–126)
Anion gap: 8 (ref 5–15)
BUN: 12 mg/dL (ref 6–20)
CO2: 27 mmol/L (ref 22–32)
Calcium: 9.4 mg/dL (ref 8.9–10.3)
Chloride: 105 mmol/L (ref 101–111)
Creatinine, Ser: 1.04 mg/dL — ABNORMAL HIGH (ref 0.44–1.00)
GFR calc Af Amer: >60 mL/min (ref >60)
GFR calc non Af Amer: >60 mL/min (ref >60)
Glucose, Bld: 84 mg/dL (ref 65–99)
Potassium: 4.1 mmol/L (ref 3.5–5.1)
Sodium: 140 mmol/L (ref 135–145)
Total Bilirubin: 0.3 mg/dL (ref 0.3–1.2)
Total Protein: 7.4 g/dL (ref 6.5–8.1)

## 2017-02-27 LAB — CBC WITH DIFFERENTIAL/PLATELET
Basophils Absolute: 0 10*3/uL (ref 0.0–0.1)
Basophils Relative: 1 %
Eosinophils Absolute: 0.2 10*3/uL (ref 0.0–0.7)
Eosinophils Relative: 4 %
HCT: 35.2 % — ABNORMAL LOW (ref 36.0–46.0)
Hemoglobin: 11.9 g/dL — ABNORMAL LOW (ref 12.0–15.0)
Lymphocytes Relative: 44 %
Lymphs Abs: 2.6 10*3/uL (ref 0.7–4.0)
MCH: 28.7 pg (ref 26.0–34.0)
MCHC: 33.8 g/dL (ref 30.0–36.0)
MCV: 85 fL (ref 78.0–100.0)
Monocytes Absolute: 0.4 10*3/uL (ref 0.1–1.0)
Monocytes Relative: 7 %
Neutro Abs: 2.6 10*3/uL (ref 1.7–7.7)
Neutrophils Relative %: 44 %
Platelets: 220 10*3/uL (ref 150–400)
RBC: 4.14 MIL/uL (ref 3.87–5.11)
RDW: 14.4 % (ref 11.5–15.5)
WBC: 5.8 10*3/uL (ref 4.0–10.5)

## 2017-02-27 LAB — TROPONIN I: Troponin I: <0.03 ng/mL (ref <0.03)

## 2017-02-27 LAB — BRAIN NATRIURETIC PEPTIDE: B Natriuretic Peptide: 8 pg/mL (ref 0.0–100.0)

## 2017-02-27 NOTE — ED Provider Notes (Signed)
[facility] DEPT Provider Note   CSN: [id number] Arrival date & time: 02/27/17  [place]     History   Chief Complaint Chief Complaint  Patient presents with  . Shortness of Breath    HPI Mary Pratt is a 46 y.o. female.Plains of shortness of breath with walking onset approximate 4 days ago. Dyspnea is also worsened walking up a flight of steps. She states that on a normal day she can walk approximately 3 blocks. For the past 4 days she reports she can walk approximate 1 block. She is asymptomatic she also develops chest pain with walking which lasts approximately a minute. She is presently asymptomatic she also reports leg bilateral swelling over the past few days.  HPI  Past Medical History:  Diagnosis Date  . 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 Anxiety   . Asthma   . Chronic systolic CHF (congestive heart failure) ([place])    a. 02/2015 Echo: EF 20%, sev dil w/ diff HK, worse @ inf base. Tiv AI, mild MR, mod dil LA, PASP 41mmHg.  . Functional dyspepsia    early satiety   . GERD with stricture    dilated 2010  . HELICOBACTER PYLORI GASTRITIS 01/15/2009   dyspnea, ? reaction to Biaxin/amoxicillin  Pylera 01/15/09 - completed therapy     . NICM (nonischemic cardiomyopathy) ([place])    a. 02/2015 Cath: nl cors, EF 15%, mild PAH;  b. 02/2015 Enrolled in VEST trial.  . Sickle cell trait ([place])   . Situational depression    "son died"    Patient Active Problem List   Diagnosis Date Noted  . Fatigue 02/16/2017  . Chronic systolic heart failure ([place]) 06/12/2015  . Nonischemic cardiomyopathy ([place]) 02/27/2015  . Essential hypertension 02/27/2015  . Cardiomyopathy ([place])   . Acute systolic heart failure ([place]) 02/22/2015  . Functional dyspepsia 01/23/2011  . Asthma 12/28/2007  . GERD with stricture 12/28/2007    Past Surgical History:  Procedure Laterality Date  . CARDIAC CATHETERIZATION N/A 02/26/2015   Procedure: Right/Left Heart Cath and Coronary Angiography;   Surgeon: Troy Sine, MD;  Location: [facility];  Service: Cardiovascular;  Laterality: N/A;  . COLPOSCOPY  2013   neg  . EP IMPLANTABLE DEVICE N/A 10/14/2015   Procedure: BiV ICD Insertion CRT-D;  Surgeon: Deboraha Sprang, MD;  Location: [facility];  Service: Cardiovascular;  Laterality: N/A;  . ESOPHAGOGASTRODUODENOSCOPY (EGD) WITH ESOPHAGEAL DILATION  11/30/2008   esophageal ring dilated 32 French, H. pylori gastritis  . TUBAL LIGATION  1991  . WISDOM TOOTH EXTRACTION    Normal coronaries 2017  OB History    Gravida Para Term Preterm AB Living   3 3 3     2    SAB TAB Ectopic Multiple Live Births           3       Home Medications    Prior to Admission medications   Medication Sig Start Date End Date Taking? Authorizing Provider  albuterol (PROVENTIL HFA;VENTOLIN HFA) 108 (90 BASE) MCG/ACT inhaler Inhale into the lungs every 6 (six) hours as needed for wheezing or shortness of breath.    [provider]  carvedilol (COREG) 25 MG tablet Take 1 tablet (25 mg total) by mouth 2 (two) times daily. 01/28/16   Deboraha Sprang, MD  citalopram (CELEXA) 20 MG tablet Take 20 mg by mouth daily.    [provider]  dexlansoprazole ([place]) 60  MG capsule Take 60 mg by mouth daily.    [provider]  fexofenadine (ALLEGRA) 180 MG tablet Take 180 mg by mouth 2 (two) times daily.     [provider]  Fluticasone-Salmeterol (ADVAIR DISKUS) 250-50 MCG/DOSE AEPB Inhale 1 puff into the lungs 2 (two) times daily. 03/23/13   [name], Armando Reichert, MD  furosemide (LASIX) 20 MG tablet Take 1 tablet (20 mg total) by mouth daily. 05/15/16   Larey Dresser, MD  hydrOXYzine (ATARAX/VISTARIL) 25 MG tablet Take 25 mg by mouth 3 (three) times daily.    [provider]  magnesium oxide (MAG-OX) 400 (241.3 Mg) MG tablet Take 1 tablet (400 mg total) by mouth daily. 05/18/16   Larey Dresser, MD  potassium chloride SA (K-DUR,KLOR-CON) 20 MEQ tablet Take 1 tablet  (20 mEq total) by mouth 2 (two) times daily. 05/18/16   Larey Dresser, MD  QNASL 80 MCG/ACT AERS Place 1 spray into the nose daily as needed (allergic rhinitis).  09/18/14   [provider]  sacubitril-valsartan (ENTRESTO) 24-26 MG Take 1 tablet by mouth 2 (two) times daily. 10/07/15   Larey Dresser, MD  spironolactone (ALDACTONE) 25 MG tablet Take 1 tablet (25 mg total) by mouth daily. 03/06/16   Larey Dresser, MD  traZODone (DESYREL) 50 MG tablet Take 50 mg by mouth at bedtime as needed for sleep.    [provider]    Family History Family History  Problem Relation Age of Onset  . Diabetes Mother   . Hypertension Mother   . Hypothyroidism Mother   . Breast cancer Maternal Aunt   . Brain cancer Maternal Uncle   . Lung cancer Maternal Grandmother   . Colon cancer Neg Hx   . Colon polyps Neg Hx   . Esophageal cancer Neg Hx     Social History Social History  Substance Use Topics  . Smoking status: Never Smoker  . Smokeless tobacco: Never Used  . Alcohol use No     Allergies   Amoxicillin and Clarithromycin   Review of Systems Review of Systems  Constitutional: Negative.   HENT: Negative.   Respiratory: Positive for shortness of breath.   Cardiovascular: Positive for chest pain and leg swelling.  Gastrointestinal: Negative.   Musculoskeletal: Negative.   Skin: Negative.   Neurological: Negative.   Psychiatric/Behavioral: Negative.   All other systems reviewed and are negative.    Physical Exam Updated Vital Signs BP 133/80 (BP Location: Left Arm)   Pulse 67   Temp 98.5 F (36.9 C) (Oral)   Resp 16   Ht 5\' 1"  (1.549 m)   Wt 70.3 kg (155 lb)   LMP 02/06/2017   SpO2 98%   BMI 29.29 kg/m   Physical Exam  Constitutional: She appears well-developed and well-nourished.  HENT:  Head: Normocephalic and atraumatic.  Eyes: Conjunctivae are normal. Pupils are equal, round, and reactive to light.  Neck: Neck supple. No JVD present. No  tracheal deviation present. No thyromegaly present.  Cardiovascular: Normal rate, regular rhythm and normal heart sounds.   No murmur heard. Pulmonary/Chest: Effort normal and breath sounds normal.  Abdominal: Soft. Bowel sounds are normal. She exhibits no distension. There is no tenderness.  Musculoskeletal: Normal range of motion. She exhibits no edema or tenderness.  No leg edema whatsoever  Neurological: She is alert. Coordination normal.  Skin: Skin is warm and dry. No rash noted.  Psychiatric: She has a normal mood and affect.  Nursing note  and vitals reviewed.    ED Treatments / Results  Labs (all labs ordered are listed, but only abnormal results are displayed) Labs Reviewed  COMPREHENSIVE METABOLIC PANEL - Abnormal; Notable for the following:       Result Value   Creatinine, Ser 1.04 (*)    All other components within normal limits  CBC WITH DIFFERENTIAL/PLATELET - Abnormal; Notable for the following:    Hemoglobin 11.9 (*)    HCT 35.2 (*)    All other components within normal limits  TROPONIN I  BRAIN NATRIURETIC PEPTIDE    EKG  EKG Interpretation  Date/Time:  Saturday February 27 2017 18:47:35 EDT Ventricular Rate:  73 PR Interval:  162 QRS Duration: 114 QT Interval:  416 QTC Calculation: 458 R Axis:   18 Text Interpretation:  Atrial-sensed ventricular-paced rhythm Abnormal ECG No significant change since last tracing Confirmed by Orlie Dakin [phone]) on 02/27/2017 8:36:09 PM       Radiology No results found.  Procedures Procedures (including critical care time)  Medications Ordered in ED Medications - No data to display  Chest x-ray viewed by me Results for orders placed or performed during the hospital encounter of 02/27/17  Comprehensive metabolic panel  Result Value Ref Range   Sodium 140 135 - 145 mmol/L   Potassium 4.1 3.5 - 5.1 mmol/L   Chloride 105 101 - 111 mmol/L   CO2 27 22 - 32 mmol/L   Glucose, Bld 84 65 - 99 mg/dL   BUN 12 6 - 20  mg/dL   Creatinine, Ser 1.04 (H) 0.44 - 1.00 mg/dL   Calcium 9.4 8.9 - 10.3 mg/dL   Total Protein 7.4 6.5 - 8.1 g/dL   Albumin 4.0 3.5 - 5.0 g/dL   AST 27 15 - 41 U/L   ALT 24 14 - 54 U/L   Alkaline Phosphatase 69 38 - 126 U/L   Total Bilirubin 0.3 0.3 - 1.2 mg/dL   GFR calc non Af Amer >60 >60 mL/min   GFR calc Af Amer >60 >60 mL/min   Anion gap 8 5 - 15  CBC with Differential  Result Value Ref Range   WBC 5.8 4.0 - 10.5 K/uL   RBC 4.14 3.87 - 5.11 MIL/uL   Hemoglobin 11.9 (L) 12.0 - 15.0 g/dL   HCT 35.2 (L) 36.0 - 46.0 %   MCV 85.0 78.0 - 100.0 fL   MCH 28.7 26.0 - 34.0 pg   MCHC 33.8 30.0 - 36.0 g/dL   RDW 14.4 11.5 - 15.5 %   Platelets 220 150 - 400 K/uL   Neutrophils Relative % 44 %   Neutro Abs 2.6 1.7 - 7.7 K/uL   Lymphocytes Relative 44 %   Lymphs Abs 2.6 0.7 - 4.0 K/uL   Monocytes Relative 7 %   Monocytes Absolute 0.4 0.1 - 1.0 K/uL   Eosinophils Relative 4 %   Eosinophils Absolute 0.2 0.0 - 0.7 K/uL   Basophils Relative 1 %   Basophils Absolute 0.0 0.0 - 0.1 K/uL  Brain natriuretic peptide  Result Value Ref Range   B Natriuretic Peptide 8.0 0.0 - 100.0 pg/mL  Troponin I  Result Value Ref Range   Troponin I <0.03 <0.03 ng/mL   Dg Chest 2 View  Result Date: 02/27/2017 CLINICAL DATA:  Chest pain and shortness of breath EXAM: CHEST  2 VIEW COMPARISON:  Chest radiograph 04/28/2016 FINDINGS: Left chest wall AICD with 3 leads in unchanged position. No focal airspace disease or pulmonary edema. No  pneumothorax or pleural effusion. Normal cardiomediastinal contours. IMPRESSION: No active cardiopulmonary disease. Electronically Signed   By: Ulyses Jarred M.D.   On: 02/27/2017 19:30   Initial Impression / Assessment and Plan / ED Course  I have reviewed the triage vital signs and the nursing notes.  Pertinent labs & imaging results that were available during my care of the patient were reviewed by me and considered in my medical decision making (see chart for  details).    Old records reviewed. Her symptoms do not seem far from her baseline. Patient has echocardiogram scheduled for 03/04/2017, afterwards she is to follow-up with her cardiologist Dr.Mclean Patient isn't in agreement with plan No evidence of congestive heart failure clinically or from x-ray or lab data Final Clinical Impressions(s) / ED Diagnoses  Diagnosis #1 exertional dyspnea #2 atypical chest pain Final diagnoses:  None    New Prescriptions New Prescriptions   No medications on file     Orlie Dakin, MD 02/27/17 2107

## 2017-02-27 NOTE — ED Notes (Signed)
EKG given to Dr. McManus.  

## 2017-02-27 NOTE — ED Triage Notes (Signed)
PT c/o SOB on exertion worsening over the past 2 days with some chest pain at times and increased swelling to bilateral lower extremities. PT states hx of CHF.

## 2017-02-27 NOTE — Discharge Instructions (Signed)
There was no evidence of congestive heart failure from today's exam, x-ray or lab work. Call Dr. Claris Gladden office in 2 days to let them know that you were here today. Return if you feel worse for any reason or arrange to see Dr. Claris Gladden office sooner than your next scheduled appointment breathing or chest discomfort worsens

## 2017-03-01 ENCOUNTER — Ambulatory Visit (INDEPENDENT_AMBULATORY_CARE_PROVIDER_SITE_OTHER): Payer: Self-pay | Admitting: *Deleted

## 2017-03-01 DIAGNOSIS — I428 Other cardiomyopathies: Secondary | ICD-10-CM

## 2017-03-01 NOTE — Progress Notes (Signed)
Remote ICD transmission.   

## 2017-03-03 ENCOUNTER — Encounter: Payer: Self-pay | Admitting: Cardiology

## 2017-03-03 LAB — CUP PACEART REMOTE DEVICE CHECK
Battery Remaining Longevity: 40 mo
Battery Voltage: 2.96 V
Brady Statistic AP VP Percent: 0.19 %
Brady Statistic AP VS Percent: 0.01 %
Brady Statistic AS VP Percent: 99.76 %
Brady Statistic AS VS Percent: 0.04 %
Brady Statistic RA Percent Paced: 0.19 %
Brady Statistic RV Percent Paced: 99.91 %
Date Time Interrogation Session: 20180623202031
HighPow Impedance: 68 Ohm
Implantable Lead Implant Date: 20170206
Implantable Lead Implant Date: 20170206
Implantable Lead Implant Date: 20170206
Implantable Lead Location: 753858
Implantable Lead Location: 753859
Implantable Lead Location: 753860
Implantable Lead Model: 4398
Implantable Lead Model: 5076
Implantable Pulse Generator Implant Date: 20170206
Lead Channel Impedance Value: 1007 Ohm
Lead Channel Impedance Value: 1064 Ohm
Lead Channel Impedance Value: 1121 Ohm
Lead Channel Impedance Value: 1178 Ohm
Lead Channel Impedance Value: 1273 Ohm
Lead Channel Impedance Value: 1330 Ohm
Lead Channel Impedance Value: 456 Ohm
Lead Channel Impedance Value: 494 Ohm
Lead Channel Impedance Value: 570 Ohm
Lead Channel Impedance Value: 646 Ohm
Lead Channel Impedance Value: 722 Ohm
Lead Channel Impedance Value: 760 Ohm
Lead Channel Impedance Value: 760 Ohm
Lead Channel Pacing Threshold Amplitude: 0.375 V
Lead Channel Pacing Threshold Amplitude: 0.375 V
Lead Channel Pacing Threshold Amplitude: 4.75 V
Lead Channel Pacing Threshold Pulse Width: 0.4 ms
Lead Channel Pacing Threshold Pulse Width: 0.4 ms
Lead Channel Pacing Threshold Pulse Width: 1 ms
Lead Channel Sensing Intrinsic Amplitude: 0.625 mV
Lead Channel Sensing Intrinsic Amplitude: 0.625 mV
Lead Channel Sensing Intrinsic Amplitude: 9.25 mV
Lead Channel Sensing Intrinsic Amplitude: 9.25 mV
Lead Channel Setting Pacing Amplitude: 1.5 V
Lead Channel Setting Pacing Amplitude: 2 V
Lead Channel Setting Pacing Amplitude: 3.75 V
Lead Channel Setting Pacing Pulse Width: 0.4 ms
Lead Channel Setting Pacing Pulse Width: 1 ms
Lead Channel Setting Sensing Sensitivity: 0.3 mV

## 2017-03-04 ENCOUNTER — Ambulatory Visit (HOSPITAL_COMMUNITY)
Admission: RE | Admit: 2017-03-04 | Discharge: 2017-03-04 | Disposition: A | Discharge status: Home / Self Care | Payer: Self-pay | Source: Ambulatory Visit | Attending: Family Medicine | Admitting: Family Medicine

## 2017-03-04 DIAGNOSIS — I081 Rheumatic disorders of both mitral and tricuspid valves: Secondary | ICD-10-CM | POA: Insufficient documentation

## 2017-03-04 DIAGNOSIS — I5022 Chronic systolic (congestive) heart failure: Secondary | ICD-10-CM | POA: Insufficient documentation

## 2017-03-04 LAB — ECHOCARDIOGRAM COMPLETE
E decel time: 268 msec
E/e' ratio: 6.17
FS: 33 % (ref 28–44)
IVS/LV PW RATIO, ED: 0.68
LA ID, A-P, ES: 37 mm
LA diam end sys: 37 mm
LA diam index: 2.1 cm/m2
LA vol A4C: 36 ml
LA vol index: 22.1 mL/m2
LA vol: 38.9 mL
LV E/e' medial: 6.17
LV E/e'average: 6.17
LV PW d: 12.1 mm — AB (ref 0.6–1.1)
LV e' LATERAL: 10.7 cm/s
LVOT SV: 55 mL
LVOT VTI: 19.4 cm
LVOT area: 2.84 cm2
LVOT diameter: 19 mm
LVOT peak grad rest: 4 mmHg
LVOT peak vel: 98.6 cm/s
Lateral S' vel: 11.4 cm/s
MV Dec: 268
MV pk A vel: 62.1 m/s
MV pk E vel: 66 m/s
TAPSE: 17.1 mm
TDI e' lateral: 10.7
TDI e' medial: 7.07

## 2017-03-04 NOTE — Progress Notes (Signed)
  Echocardiogram 2D Echocardiogram has been performed.  Johny Chess 03/04/2017, 1:48 PM

## 2017-03-11 ENCOUNTER — Telehealth (HOSPITAL_COMMUNITY): Payer: Self-pay

## 2017-03-11 NOTE — Telephone Encounter (Signed)
ECHOCARDIOGRAM COMPLETE  Order: [id number]  Status:  Final result Visible to patient:  Yes (MyChart) Dx:  Chronic systolic heart failure ([place])  Notes recorded by Effie Berkshire, RN on 03/11/2017 at 1:36 PM EDT Patient aware ------  Notes recorded by Shirley Friar, PA-C on 03/05/2017 at 7:21 AM EDT EF stable    Legrand Como [address] [place], PA-C 03/05/2017 7:21 AM

## 2017-03-17 ENCOUNTER — Encounter: Payer: Self-pay | Admitting: Cardiology

## 2017-04-01 MED FILL — SPIRONOLACTONE 25 MG TABLET: 25 | 34 days supply | Qty: 34 | Fill #1

## 2017-04-01 MED FILL — CARVEDILOL 25 MG TABLET: 25 | 34 days supply | Qty: 68 | Fill #1

## 2017-05-13 ENCOUNTER — Encounter: Payer: Self-pay | Admitting: Physician Assistant

## 2017-05-13 NOTE — Progress Notes (Deleted)
Cardiology Office Note Date:  05/13/2017  Patient ID:  Mary Pratt, Mary Pratt 04-Oct-1970, MRN [id number] PCP:  Caryl Bis, MD  Cardiologist:  Dr. [name] Electrophysiologist; Dr. Caryl Comes  ***refresh   Chief Complaint: device visit  History of Present Illness: Mary Pratt is a 47 y.o. female with history of NICM, anxiety, asthma, sickle cell trait, chronic CHF (systolic), CRT-D, comes in today to be seen for Dr. Caryl Comes.  6/16 her echo showed EF 20% with severe LV dilation.  Coronary angiography showed normal coronaries.  RHC showed preserved cardiac output.  She was diuresed and started on cardiac meds. Repeat echo in 12/16 showed EF 30% with septal-lateral dyssynchrony.  She had Medtronic CRT-D system placed in 2/17.  Most recent echo in 02/2017 showed remained recovery of LV function, EF 55-60%.   Last seen by Dr. Caryl Comes May of 2017, autocapture had outputs programmed high, her device was reprogrammed, otherwise no changes.  *** symoptoms *** fluid status *** follows w/CHF *** meds *** % bive  Device information: MDT CRT-D, implanted 10/14/15, Dr. Caryl Comes  Past Medical History:  Diagnosis Date  . 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 Anxiety   . Asthma   . Chronic systolic CHF (congestive heart failure) ([place])    a. 02/2015 Echo: EF 20%, sev dil w/ diff HK, worse @ inf base. Tiv AI, mild MR, mod dil LA, PASP 43mmHg.  . Functional dyspepsia    early satiety   . GERD with stricture    dilated 2010  . HELICOBACTER PYLORI GASTRITIS 01/15/2009   dyspnea, ? reaction to Biaxin/amoxicillin  Pylera 01/15/09 - completed therapy     . NICM (nonischemic cardiomyopathy) ([place])    a. 02/2015 Cath: nl cors, EF 15%, mild PAH;  b. 02/2015 Enrolled in VEST trial.  . Sickle cell trait ([place])   . Situational depression    "son died"    Past Surgical History:  Procedure Laterality Date  . CARDIAC CATHETERIZATION N/A 02/26/2015   Procedure: Right/Left Heart Cath and Coronary Angiography;   Surgeon: Troy Sine, MD;  Location: [facility];  Service: Cardiovascular;  Laterality: N/A;  . COLPOSCOPY  2013   neg  . EP IMPLANTABLE DEVICE N/A 10/14/2015   Procedure: BiV ICD Insertion CRT-D;  Surgeon: Deboraha Sprang, MD;  Location: [facility];  Service: Cardiovascular;  Laterality: N/A;  . ESOPHAGOGASTRODUODENOSCOPY (EGD) WITH ESOPHAGEAL DILATION  11/30/2008   esophageal ring dilated 78 French, H. pylori gastritis  . TUBAL LIGATION  1991  . WISDOM TOOTH EXTRACTION      Current Outpatient Prescriptions  Medication Sig Dispense Refill  . albuterol (PROVENTIL HFA;VENTOLIN HFA) 108 (90 BASE) MCG/ACT inhaler Inhale into the lungs every 6 (six) hours as needed for wheezing or shortness of breath.    . carvedilol (COREG) 25 MG tablet Take 1 tablet (25 mg total) by mouth 2 (two) times daily. 60 tablet 11  . citalopram (CELEXA) 20 MG tablet Take 20 mg by mouth daily.    Marland Kitchen dexlansoprazole (DEXILANT) 60 MG capsule Take 60 mg by mouth daily.    . fexofenadine (ALLEGRA) 180 MG tablet Take 180 mg by mouth 2 (two) times daily.     . Fluticasone-Salmeterol (ADVAIR DISKUS) 250-50 MCG/DOSE AEPB Inhale 1 puff into the lungs 2 (two) times daily. 180 each 4  . furosemide (LASIX) 20 MG tablet Take 1 tablet (20 mg total) by mouth daily. 30 tablet 6  . hydrOXYzine (  ATARAX/VISTARIL) 25 MG tablet Take 25 mg by mouth 3 (three) times daily.    . magnesium oxide (MAG-OX) 400 (241.3 Mg) MG tablet Take 1 tablet (400 mg total) by mouth daily. 30 tablet 6  . potassium chloride SA (K-DUR,KLOR-CON) 20 MEQ tablet Take 1 tablet (20 mEq total) by mouth 2 (two) times daily. 60 tablet 6  . QNASL 80 MCG/ACT AERS Place 1 spray into the nose daily as needed (allergic rhinitis).     . sacubitril-valsartan (ENTRESTO) 24-26 MG Take 1 tablet by mouth 2 (two) times daily. 60 tablet 6  . spironolactone (ALDACTONE) 25 MG tablet Take 1 tablet (25 mg total) by mouth daily. 30 tablet 6  . traZODone (DESYREL) 50 MG tablet  Take 50 mg by mouth at bedtime as needed for sleep.     No current facility-administered medications for this visit.     Allergies:   Amoxicillin and Clarithromycin   Social History:  The patient  reports that she has never smoked. She has never used smokeless tobacco. She reports that she does not drink alcohol or use drugs.   Family History:  The patient's family history includes Brain cancer in her maternal uncle; Breast cancer in her maternal aunt; Diabetes in her mother; Hypertension in her mother; Hypothyroidism in her mother; Lung cancer in her maternal grandmother.***  ROS:  Please see the history of present illness. Otherwise, review of systems is positive for ***.   All other systems are reviewed and otherwise negative.   PHYSICAL EXAM: *** VS:  There were no vitals taken for this visit. BMI: There is no height or weight on file to calculate BMI. Well nourished, well developed, in no acute distress  HEENT: normocephalic, atraumatic  Neck: no JVD, carotid bruits or masses Cardiac: *** RRR; no significant murmurs, no rubs, or gallops Lungs: *** CTA b/l, no wheezing, rhonchi or rales  Abd: soft, nontender MS: no deformity or atrophy Ext: *** no edema  Skin: warm and dry, no rash Neuro:  No gross deficits appreciated Psych: euthymic mood, full affect  *** ICD site is stable, no tethering or discomfort   EKG: *** ICD interrogation done today and reviewed by myself: ***  Recent Labs: 02/16/2017: TSH 0.568 02/27/2017: ALT 24; B Natriuretic Peptide 8.0; BUN 12; Creatinine, Ser 1.04; Hemoglobin 11.9; Platelets 220; Potassium 4.1; Sodium 140  No results found for requested labs within last 8760 hours.   CrCl cannot be calculated (Patient's most recent lab result is older than the maximum 21 days allowed.).   Wt Readings from Last 3 Encounters:  02/27/17 155 lb (70.3 kg)  02/16/17 156 lb 12.8 oz (71.1 kg)  06/30/16 148 lb 8 oz (67.4 kg)     Other studies  reviewed: Additional studies/records reviewed today include: summarized above  ASSESSMENT AND PLAN:  1, CRT-D     ***  2. Chronic CHF     ***  3. NICM     ***  Disposition: F/u with ***  Current medicines are reviewed at length with the patient today.  The patient did not have any concerns regarding medicines.***  Signed, [name], PA-C 05/13/2017 9:13 AM     [facility] [address] [place] [address] [phone] (office)  [phone] (fax)

## 2017-05-27 NOTE — Progress Notes (Signed)
Cardiology Office Note Date:  05/28/2017  Patient ID:  Mary Pratt, Mary Pratt 1970-11-28, MRN [id number] PCP:  Caryl Bis, MD  Cardiologist:  Dr. [name] Electrophysiologist; Dr. Caryl Comes   Chief Complaint: device visit  History of Present Illness: Mary Pratt is a 46 y.o. female with history of NICM, anxiety, asthma, sickle cell trait, chronic CHF (systolic), CRT-D, comes in today to be seen for Dr. Caryl Comes.  6/16 her echo showed EF 20% with severe LV dilation.  Coronary angiography showed normal coronaries.  RHC showed preserved cardiac output.  She was diuresed and started on cardiac meds. Repeat echo in 12/16 showed EF 30% with septal-lateral dyssynchrony.  She had Medtronic CRT-D system placed in 2/17.  Most recent echo in 02/2017 showed remained recovery of LV function, EF 55-60%.   Last seen by Dr. Caryl Comes May of 2017, autocapture had outputs programmed high, her device was reprogrammed, otherwise no changes.  She reports doing well but frustrated with her weight.  She reports feeling somewhat bloated and thinks she may be retaining fluid on/off, she will use an extra dose of lasix intermittently.  She has a chronic sounding CP, unchanged for years, going back to the time of her cath with normal coronaries.  No dizziness, near syncope or syncope, no shocks from her device.  No rest SOB, mentions some DOE.  Device information: MDT CRT-D, implanted 10/14/15, Dr. Caryl Comes  Past Medical History:  Diagnosis Date  . 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 Anxiety   . Asthma   . Chronic systolic CHF (congestive heart failure) ([place])    a. 02/2015 Echo: EF 20%, sev dil w/ diff HK, worse @ inf base. Tiv AI, mild MR, mod dil LA, PASP 21mmHg.  . Functional dyspepsia    early satiety   . GERD with stricture    dilated 2010  . HELICOBACTER PYLORI GASTRITIS 01/15/2009   dyspnea, ? reaction to Biaxin/amoxicillin  Pylera 01/15/09 - completed therapy     . NICM (nonischemic cardiomyopathy)  ([place])    a. 02/2015 Cath: nl cors, EF 15%, mild PAH;  b. 02/2015 Enrolled in VEST trial.  . Sickle cell trait ([place])   . Situational depression    "son died"    Past Surgical History:  Procedure Laterality Date  . CARDIAC CATHETERIZATION N/A 02/26/2015   Procedure: Right/Left Heart Cath and Coronary Angiography;  Surgeon: Troy Sine, MD;  Location: [facility];  Service: Cardiovascular;  Laterality: N/A;  . COLPOSCOPY  2013   neg  . EP IMPLANTABLE DEVICE N/A 10/14/2015   Procedure: BiV ICD Insertion CRT-D;  Surgeon: Deboraha Sprang, MD;  Location: [facility];  Service: Cardiovascular;  Laterality: N/A;  . ESOPHAGOGASTRODUODENOSCOPY (EGD) WITH ESOPHAGEAL DILATION  11/30/2008   esophageal ring dilated 70 French, H. pylori gastritis  . TUBAL LIGATION  1991  . WISDOM TOOTH EXTRACTION      Current Outpatient Prescriptions  Medication Sig Dispense Refill  . albuterol (PROVENTIL HFA;VENTOLIN HFA) 108 (90 BASE) MCG/ACT inhaler Inhale into the lungs every 6 (six) hours as needed for wheezing or shortness of breath.    . carvedilol (COREG) 25 MG tablet Take 1 tablet (25 mg total) by mouth 2 (two) times daily. 60 tablet 11  . citalopram (CELEXA) 20 MG tablet Take 20 mg by mouth daily.    Marland Kitchen dexlansoprazole (DEXILANT) 60 MG capsule Take 60 mg by mouth daily.    . fexofenadine (ALLEGRA) 180  MG tablet Take 180 mg by mouth 2 (two) times daily.     . Fluticasone-Salmeterol (ADVAIR DISKUS) 250-50 MCG/DOSE AEPB Inhale 1 puff into the lungs 2 (two) times daily. 180 each 4  . furosemide (LASIX) 20 MG tablet Take 1 tablet (20 mg total) by mouth daily. 30 tablet 6  . hydrOXYzine (ATARAX/VISTARIL) 25 MG tablet Take 25 mg by mouth 3 (three) times daily.    . magnesium oxide (MAG-OX) 400 (241.3 Mg) MG tablet Take 1 tablet (400 mg total) by mouth daily. 30 tablet 6  . potassium chloride SA (K-DUR,KLOR-CON) 20 MEQ tablet Take 1 tablet (20 mEq total) by mouth 2 (two) times daily. 60 tablet 6  . QNASL  80 MCG/ACT AERS Place 1 spray into the nose daily as needed (allergic rhinitis).     . sacubitril-valsartan (ENTRESTO) 24-26 MG Take 1 tablet by mouth 2 (two) times daily. 60 tablet 6  . spironolactone (ALDACTONE) 25 MG tablet Take 1 tablet (25 mg total) by mouth daily. 30 tablet 6  . traZODone (DESYREL) 50 MG tablet Take 50 mg by mouth at bedtime as needed for sleep.     No current facility-administered medications for this visit.     Allergies:   Amoxicillin and Clarithromycin   Social History:  The patient  reports that she has never smoked. She has never used smokeless tobacco. She reports that she does not drink alcohol or use drugs.   Family History:  The patient's family history includes Brain cancer in her maternal uncle; Breast cancer in her maternal aunt; Diabetes in her mother; Hypertension in her mother; Hypothyroidism in her mother; Lung cancer in her maternal grandmother.  ROS:  Please see the history of present illness.    All other systems are reviewed and otherwise negative.   PHYSICAL EXAM:  VS:  BP 126/82   Pulse 74   Ht 5\' 1"  (1.549 m)   Wt 160 lb (72.6 kg)   SpO2 94%   BMI 30.23 kg/m  BMI: Body mass index is 30.23 kg/m. Well nourished, well developed, in no acute distress  HEENT: normocephalic, atraumatic  Neck: no JVD, carotid bruits or masses Cardiac: RRR; no significant murmurs, no rubs, or gallops Lungs: CTA b/l, no wheezing, rhonchi or rales  Abd: soft, nontender MS: no deformity or atrophy Ext: no edema is appreciated Skin: warm and dry, no rash Neuro:  No gross deficits appreciated Psych: euthymic mood, full affect  ICD site is stable, no tethering or discomfort   EKG: not done today ICD interrogation done today and reviewed by myself: battery and lead measurements are good, one AMS episode of AF 65minutes, no VT/V episodes, 99.8%BiVe pacing  Recent Labs: 02/16/2017: TSH 0.568 02/27/2017: ALT 24; B Natriuretic Peptide 8.0; BUN 12; Creatinine,  Ser 1.04; Hemoglobin 11.9; Platelets 220; Potassium 4.1; Sodium 140  No results found for requested labs within last 8760 hours.   CrCl cannot be calculated (Patient's most recent lab result is older than the maximum 21 days allowed.).   Wt Readings from Last 3 Encounters:  05/28/17 160 lb (72.6 kg)  02/27/17 155 lb (70.3 kg)  02/16/17 156 lb 12.8 oz (71.1 kg)     Other studies reviewed: Additional studies/records reviewed today include: summarized above  ASSESSMENT AND PLAN:  1, CRT-D     Intact function, no changes made   2. Chronic CHF     Weight is up?  Her exam does not suggest fluid OL, though Optivol is below threshold is  trending upwards     Discussed daily weights     She uses an extra dose of lasix PRN, she will take daily lasix for 2-3 days and resume her usual regime, she says currently is QOD  Suspect a component of slow progressive weight gain as well  3. NICM     Interval normalization of her LVEF     She follows closely with HF team  4. One AF episode, 33 minutes, 3 mo ago     CHA2DS2Vasc is 2 with hx of CM     Given normalization of her EF, and short duration, plan to monitor her AF burden       Disposition: F/u with Dr. [name] as scheduled in November, Q 3 month remote device checks and in-clinic EP visit in 1 year, sooner if needed.  Current medicines are reviewed at length with the patient today.  The patient did not have any concerns regarding medicines  Signed, [name], PA-C 05/28/2017 2:42 PM     [address] [place] [address] [phone] (office)  [phone] (fax)

## 2017-05-28 ENCOUNTER — Encounter: Payer: Self-pay | Admitting: Physician Assistant

## 2017-05-28 ENCOUNTER — Ambulatory Visit (INDEPENDENT_AMBULATORY_CARE_PROVIDER_SITE_OTHER): Payer: Self-pay | Admitting: Physician Assistant

## 2017-05-28 VITALS — BP 126/82 | HR 74 | Ht 61.0 in | Wt 160.0 lb

## 2017-05-28 DIAGNOSIS — I5022 Chronic systolic (congestive) heart failure: Secondary | ICD-10-CM

## 2017-05-28 DIAGNOSIS — Z95810 Presence of automatic (implantable) cardiac defibrillator: Secondary | ICD-10-CM

## 2017-05-28 DIAGNOSIS — I428 Other cardiomyopathies: Secondary | ICD-10-CM

## 2017-05-28 DIAGNOSIS — I480 Paroxysmal atrial fibrillation: Secondary | ICD-10-CM

## 2017-05-28 NOTE — Patient Instructions (Addendum)
Medication Instructions: Your physician recommends that you continue on your current medications as directed. Please refer to the Current Medication list given to you today.  Labwork: None Ordered  Procedures/Testing: None Ordered  Follow-Up: Your physician wants you to follow-up in: 1 YEAR with Dr. Caryl Comes. You will receive a reminder letter in the mail two months in advance. If you don't receive a letter, please call our office to schedule the follow-up appointment.  Remote monitoring is used to monitor your ICD from home. This monitoring reduces the number of office visits required to check your device to one time per year. It allows [place] to keep an eye on the functioning of your device to ensure it is working properly. You are scheduled for a device check from home on 05/31/17. You may send your transmission at any time that day. If you have a wireless device, the transmission will be sent automatically. After your physician reviews your transmission, you will receive a postcard with your next transmission date.  Your physician recommends that you schedule a follow-up appointment in December with Dr. [name] at our [facility]. Someone from his office should be contacting you to set up that appointment.   If you need a refill on your cardiac medications before your next appointment, please call your pharmacy.

## 2017-05-31 ENCOUNTER — Ambulatory Visit (INDEPENDENT_AMBULATORY_CARE_PROVIDER_SITE_OTHER): Payer: Self-pay | Admitting: *Deleted

## 2017-05-31 DIAGNOSIS — I428 Other cardiomyopathies: Secondary | ICD-10-CM

## 2017-05-31 DIAGNOSIS — I5022 Chronic systolic (congestive) heart failure: Secondary | ICD-10-CM

## 2017-05-31 NOTE — Progress Notes (Signed)
Remote ICD transmission.   

## 2017-06-01 LAB — CUP PACEART REMOTE DEVICE CHECK
Battery Remaining Longevity: 36 mo
Battery Voltage: 2.86 V
Brady Statistic AP VP Percent: 0.02 %
Brady Statistic AP VS Percent: 0 %
Brady Statistic AS VP Percent: 99.94 %
Brady Statistic AS VS Percent: 0.04 %
Brady Statistic RA Percent Paced: 0.02 %
Brady Statistic RV Percent Paced: 99.93 %
Date Time Interrogation Session: 20180923234200
HighPow Impedance: 68 Ohm
Implantable Lead Implant Date: 20170206
Implantable Lead Implant Date: 20170206
Implantable Lead Implant Date: 20170206
Implantable Lead Location: 753858
Implantable Lead Location: 753859
Implantable Lead Location: 753860
Implantable Lead Model: 4398
Implantable Lead Model: 5076
Implantable Pulse Generator Implant Date: 20170206
Lead Channel Impedance Value: 1102 Ohm
Lead Channel Impedance Value: 1121 Ohm
Lead Channel Impedance Value: 1159 Ohm
Lead Channel Impedance Value: 1178 Ohm
Lead Channel Impedance Value: 1387 Ohm
Lead Channel Impedance Value: 1444 Ohm
Lead Channel Impedance Value: 437 Ohm
Lead Channel Impedance Value: 494 Ohm
Lead Channel Impedance Value: 551 Ohm
Lead Channel Impedance Value: 722 Ohm
Lead Channel Impedance Value: 779 Ohm
Lead Channel Impedance Value: 817 Ohm
Lead Channel Impedance Value: 817 Ohm
Lead Channel Pacing Threshold Amplitude: 0.375 V
Lead Channel Pacing Threshold Amplitude: 0.5 V
Lead Channel Pacing Threshold Amplitude: 4.75 V
Lead Channel Pacing Threshold Pulse Width: 0.4 ms
Lead Channel Pacing Threshold Pulse Width: 0.4 ms
Lead Channel Pacing Threshold Pulse Width: 1 ms
Lead Channel Sensing Intrinsic Amplitude: 0.75 mV
Lead Channel Sensing Intrinsic Amplitude: 0.75 mV
Lead Channel Sensing Intrinsic Amplitude: 8 mV
Lead Channel Sensing Intrinsic Amplitude: 8 mV
Lead Channel Setting Pacing Amplitude: 1.5 V
Lead Channel Setting Pacing Amplitude: 2 V
Lead Channel Setting Pacing Amplitude: 3.75 V
Lead Channel Setting Pacing Pulse Width: 0.4 ms
Lead Channel Setting Pacing Pulse Width: 1 ms
Lead Channel Setting Sensing Sensitivity: 0.3 mV

## 2017-06-03 ENCOUNTER — Encounter: Payer: Self-pay | Admitting: Cardiology

## 2017-06-16 MED FILL — SPIRONOLACTONE 25 MG TABLET: 25 | 34 days supply | Qty: 34 | Fill #2

## 2017-06-16 MED FILL — CARVEDILOL 25 MG TABLET: 25 | 34 days supply | Qty: 68 | Fill #2

## 2017-06-18 ENCOUNTER — Encounter: Payer: Self-pay | Admitting: Cardiology

## 2017-06-22 ENCOUNTER — Encounter (HOSPITAL_COMMUNITY): Payer: Self-pay

## 2017-06-22 NOTE — Progress Notes (Signed)
Medical record request sent to [facility] and [place] attorney group per request for all available records from our office/providers 10/08/2015 -- present. 62 pages total faxed to provided fax # [phone] Copy of request scanned in to patient's electronic medical record.  Renee Pain, RN

## 2017-07-26 ENCOUNTER — Encounter: Payer: Self-pay | Admitting: Internal Medicine

## 2017-08-02 ENCOUNTER — Other Ambulatory Visit (HOSPITAL_COMMUNITY): Payer: Self-pay | Admitting: Cardiology

## 2017-08-13 MED FILL — SPIRONOLACTONE 25 MG TABLET: 25 | 34 days supply | Qty: 34 | Fill #0

## 2017-08-13 MED FILL — CARVEDILOL 25 MG TABLET: 25 | 34 days supply | Qty: 68 | Fill #0

## 2017-09-01 ENCOUNTER — Ambulatory Visit (INDEPENDENT_AMBULATORY_CARE_PROVIDER_SITE_OTHER): Payer: Self-pay | Admitting: *Deleted

## 2017-09-01 DIAGNOSIS — I5022 Chronic systolic (congestive) heart failure: Secondary | ICD-10-CM

## 2017-09-01 DIAGNOSIS — I428 Other cardiomyopathies: Secondary | ICD-10-CM

## 2017-09-02 ENCOUNTER — Encounter: Payer: Self-pay | Admitting: Cardiology

## 2017-09-02 LAB — CUP PACEART REMOTE DEVICE CHECK
Battery Remaining Longevity: 30 mo
Battery Voltage: 2.9 V
Brady Statistic AP VP Percent: 0.04 %
Brady Statistic AP VS Percent: 0 %
Brady Statistic AS VP Percent: 99.91 %
Brady Statistic AS VS Percent: 0.04 %
Brady Statistic RA Percent Paced: 0.05 %
Brady Statistic RV Percent Paced: 99.95 %
Date Time Interrogation Session: 20181226005810
HighPow Impedance: 69 Ohm
Implantable Lead Implant Date: 20170206
Implantable Lead Implant Date: 20170206
Implantable Lead Implant Date: 20170206
Implantable Lead Location: 753858
Implantable Lead Location: 753859
Implantable Lead Location: 753860
Implantable Lead Model: 4398
Implantable Lead Model: 5076
Implantable Pulse Generator Implant Date: 20170206
Lead Channel Impedance Value: 1045 Ohm
Lead Channel Impedance Value: 1159 Ohm
Lead Channel Impedance Value: 1178 Ohm
Lead Channel Impedance Value: 1178 Ohm
Lead Channel Impedance Value: 1387 Ohm
Lead Channel Impedance Value: 1444 Ohm
Lead Channel Impedance Value: 456 Ohm
Lead Channel Impedance Value: 494 Ohm
Lead Channel Impedance Value: 551 Ohm
Lead Channel Impedance Value: 608 Ohm
Lead Channel Impedance Value: 760 Ohm
Lead Channel Impedance Value: 779 Ohm
Lead Channel Impedance Value: 817 Ohm
Lead Channel Pacing Threshold Amplitude: 0.5 V
Lead Channel Pacing Threshold Amplitude: 0.625 V
Lead Channel Pacing Threshold Amplitude: 4.75 V
Lead Channel Pacing Threshold Pulse Width: 0.4 ms
Lead Channel Pacing Threshold Pulse Width: 0.4 ms
Lead Channel Pacing Threshold Pulse Width: 1 ms
Lead Channel Sensing Intrinsic Amplitude: 2.25 mV
Lead Channel Sensing Intrinsic Amplitude: 2.25 mV
Lead Channel Sensing Intrinsic Amplitude: 7.5 mV
Lead Channel Sensing Intrinsic Amplitude: 7.5 mV
Lead Channel Setting Pacing Amplitude: 1.5 V
Lead Channel Setting Pacing Amplitude: 2 V
Lead Channel Setting Pacing Amplitude: 3.75 V
Lead Channel Setting Pacing Pulse Width: 0.4 ms
Lead Channel Setting Pacing Pulse Width: 1 ms
Lead Channel Setting Sensing Sensitivity: 0.3 mV

## 2017-09-02 NOTE — Progress Notes (Signed)
Remote ICD transmission.   

## 2017-09-08 ENCOUNTER — Emergency Department (HOSPITAL_COMMUNITY)
Admission: EM | Admit: 2017-09-08 | Discharge: 2017-09-08 | Discharge status: Against Medical Advice | Payer: Self-pay | Attending: Emergency Medicine | Admitting: Emergency Medicine

## 2017-09-08 ENCOUNTER — Encounter (HOSPITAL_COMMUNITY): Payer: Self-pay | Admitting: *Deleted

## 2017-09-08 ENCOUNTER — Other Ambulatory Visit: Payer: Self-pay

## 2017-09-08 DIAGNOSIS — I959 Hypotension, unspecified: Secondary | ICD-10-CM | POA: Insufficient documentation

## 2017-09-08 DIAGNOSIS — Z5321 Procedure and treatment not carried out due to patient leaving prior to being seen by health care provider: Secondary | ICD-10-CM | POA: Insufficient documentation

## 2017-09-08 HISTORY — DX: Presence of automatic (implantable) cardiac defibrillator: Z95.810

## 2017-09-08 NOTE — ED Notes (Signed)
Per registration pt is leaving the facility without being seen.

## 2017-09-08 NOTE — ED Triage Notes (Addendum)
Pt c/o low blood pressure, dizziness, headaches x 2 days. Pt reports her BP was 82/62 yesterday. Pt's BP 132/91 in triage.

## 2017-10-14 ENCOUNTER — Other Ambulatory Visit (HOSPITAL_COMMUNITY): Payer: Self-pay | Admitting: Cardiology

## 2017-10-14 ENCOUNTER — Other Ambulatory Visit (HOSPITAL_COMMUNITY): Payer: Self-pay | Admitting: Internal Medicine

## 2017-10-27 ENCOUNTER — Encounter (HOSPITAL_COMMUNITY): Payer: Self-pay

## 2017-10-27 ENCOUNTER — Ambulatory Visit (HOSPITAL_COMMUNITY)
Admission: RE | Admit: 2017-10-27 | Discharge: 2017-10-27 | Disposition: A | Discharge status: Home / Self Care | Payer: Self-pay | Source: Ambulatory Visit | Attending: Internal Medicine | Admitting: Internal Medicine

## 2017-10-27 ENCOUNTER — Encounter: Payer: Self-pay | Admitting: Cardiology

## 2017-10-27 VITALS — BP 125/86 | HR 78 | Wt 159.0 lb

## 2017-10-27 DIAGNOSIS — J45909 Unspecified asthma, uncomplicated: Secondary | ICD-10-CM | POA: Insufficient documentation

## 2017-10-27 DIAGNOSIS — I5022 Chronic systolic (congestive) heart failure: Secondary | ICD-10-CM | POA: Insufficient documentation

## 2017-10-27 DIAGNOSIS — K219 Gastro-esophageal reflux disease without esophagitis: Secondary | ICD-10-CM | POA: Insufficient documentation

## 2017-10-27 DIAGNOSIS — Z8719 Personal history of other diseases of the digestive system: Secondary | ICD-10-CM | POA: Insufficient documentation

## 2017-10-27 DIAGNOSIS — D573 Sickle-cell trait: Secondary | ICD-10-CM | POA: Insufficient documentation

## 2017-10-27 DIAGNOSIS — I429 Cardiomyopathy, unspecified: Secondary | ICD-10-CM | POA: Insufficient documentation

## 2017-10-27 DIAGNOSIS — I420 Dilated cardiomyopathy: Secondary | ICD-10-CM | POA: Insufficient documentation

## 2017-10-27 DIAGNOSIS — I428 Other cardiomyopathies: Secondary | ICD-10-CM

## 2017-10-27 DIAGNOSIS — Z79899 Other long term (current) drug therapy: Secondary | ICD-10-CM | POA: Insufficient documentation

## 2017-10-27 LAB — BASIC METABOLIC PANEL
Anion gap: 10 (ref 5–15)
BUN: 14 mg/dL (ref 6–20)
CO2: 25 mmol/L (ref 22–32)
Calcium: 9.3 mg/dL (ref 8.9–10.3)
Chloride: 102 mmol/L (ref 101–111)
Creatinine, Ser: 0.95 mg/dL (ref 0.44–1.00)
GFR calc Af Amer: >60 mL/min (ref >60)
GFR calc non Af Amer: >60 mL/min (ref >60)
Glucose, Bld: 101 mg/dL — ABNORMAL HIGH (ref 65–99)
Potassium: 4.3 mmol/L (ref 3.5–5.1)
Sodium: 137 mmol/L (ref 135–145)

## 2017-10-27 LAB — LIPID PANEL
Cholesterol: 165 mg/dL (ref 0–200)
HDL: 43 mg/dL (ref >40)
LDL Cholesterol: 62 mg/dL (ref 0–99)
Total CHOL/HDL Ratio: 3.8 RATIO
Triglycerides: 302 mg/dL — ABNORMAL HIGH (ref <150)
VLDL: 60 mg/dL — ABNORMAL HIGH (ref 0–40)

## 2017-10-27 LAB — CBC
HCT: 35.8 % — ABNORMAL LOW (ref 36.0–46.0)
Hemoglobin: 12 g/dL (ref 12.0–15.0)
MCH: 28.2 pg (ref 26.0–34.0)
MCHC: 33.5 g/dL (ref 30.0–36.0)
MCV: 84.2 fL (ref 78.0–100.0)
Platelets: 292 10*3/uL (ref 150–400)
RBC: 4.25 MIL/uL (ref 3.87–5.11)
RDW: 13.8 % (ref 11.5–15.5)
WBC: 8.5 10*3/uL (ref 4.0–10.5)

## 2017-10-27 MED ORDER — CARVEDILOL 25 MG PO TABS: 25.0000 mg | ORAL_TABLET | Freq: Two times a day (BID) | ORAL | 2 refills | Status: DC

## 2017-10-27 MED ORDER — SPIRONOLACTONE 25 MG PO TABS: 25.0000 mg | ORAL_TABLET | Freq: Every day | ORAL | 2 refills | Status: DC

## 2017-10-27 MED FILL — CARVEDILOL 25 MG TABLET: 25 | 34 days supply | Qty: 68 | Fill #0

## 2017-10-27 NOTE — Addendum Note (Signed)
Encounter addended by: Shirley Friar, PA-C on: 10/27/2017 3:29 PM  Actions taken: Sign clinical note

## 2017-10-27 NOTE — Progress Notes (Signed)
CSW met with patient to discuss options for insurance. Patient states she works part time at [employer] and no insurance offered. Patient also states she has a pending appeal in process for disability and recently went to court for the appeal. Patient lives in [place] therefore no eligible for the [employer]. CSW discussed the [employer] program and provided information for follow up. Patient grateful for assistance and will follow up with CSW as needed. Raquel Sarna, [place], [place]

## 2017-10-27 NOTE — Patient Instructions (Signed)
Prescription has been sent to [facility] under the [facility]. Address: [address] [place], [place] [address]  Phone: [phone] Located next to [facility].  Routine lab work today. Will notify you of abnormal results, otherwise no news is good news!  Follow up 6 months with Dr. [name]. We will call you closer to this time, or you may call our office to schedule 1 month before you are due to be seen. Take all medication as prescribed the day of your appointment. Bring all medications with you to your appointment.  Do the following things EVERYDAY: 1) Weigh yourself in the morning before breakfast. Write it down and keep it in a log. 2) Take your medicines as prescribed 3) Eat low salt foods-Limit salt (sodium) to 2000 mg per day.  4) Stay as active as you can everyday 5) Limit all fluids for the day to less than 2 liters

## 2017-10-27 NOTE — Addendum Note (Signed)
Encounter addended by: Effie Berkshire, RN on: 10/27/2017 3:38 PM  Actions taken: Pharmacy for encounter modified, Order list changed

## 2017-10-27 NOTE — Addendum Note (Signed)
Encounter addended by: Shirley Friar, PA-C on: 10/27/2017 4:06 PM  Actions taken: Sign clinical note

## 2017-10-27 NOTE — Addendum Note (Signed)
Encounter addended by: Louann Liv, LCSW on: 10/27/2017 3:44 PM  Actions taken: Sign clinical note

## 2017-10-27 NOTE — Progress Notes (Addendum)
Patient ID: Mary Pratt, female   DOB: 1971/08/16, 47 y.o.   MRN: [id number]    Advanced Heart Failure Clinic Note   PCP: Dr. Gar Ponto Cardiology: Dr. [name]  47 yo presents for evaluation of nonischemic dilated cardiomyopathy.  She developed exertional dyspnea and orthopnea in 5/16.  She had had no prior known cardiac problems.  She was admitted in 6/16 with CHF.  Echo showed EF 20% with severe LV dilation.  Coronary angiography showed normal coronaries.  RHC showed preserved cardiac output.  She was diuresed and started on cardiac meds. Repeat echo in 12/16 showed EF 30% with septal-lateral dyssynchrony.  She had Medtronic CRT-D system placed in 2/17.  Most recent echo in 8/17 showed recovery of LV function, EF 55-60%.    She presents today for regular 6 month follow up.  Doing well overall. Getting over a URI. She states she has been taking all of her medications, although in Epic, coreg shows last refill 07/2017. When asked, pt states she "had enough" to last her. Denies lightheadedness or dizziness. She remains active. She has intermittent orthopnea and takes an extra lasix once every 1-2 weeks. No ETOH or smoking.   Medtronic ICD Interrogation: Personally reviewed. Thoracic impedence above threshold. Pt activity > 6 hours daily.   Labs (7/17): digoxin 0.9 Labs (8/17): K 3.4, creatinine 1.13, BNP 7.1  PMH: 1. GERD 2. Asthma 3. Sickle cell trait 4. H/o H pylori gastritis 5. Cardiomyopathy: Nonischemic.  Echo (6/16) with EF 20%, severe LV dilation, mild MR, PA systolic pressure 54 mmHg.  LHC/RHC (6/16) with EF 15%, CI 2.56 Fick/2.12 thermo, mean RA 1, PA 39/13 mean 26, mean PCWP 12, normal coronaries. cMRI (7/16) with moderately dilated LV with EF 31%, prominent LV septal-lateral dyssynchrony, normal RV size and systolic function, no myocardial LGE, so no definitive evidence for prior MI, Myocarditis, or infiltrative disease.  CPX (9/16) with peak VO2 15.9, VE/VCO2 slope 23.9, RER 1.09  => moderate functional impairment.  Echo (12/16) with EF 30%, moderate LVH, septal-lateral dyssynchrony, normal RV size and systolic function.  - Medtronic CRT-D placed 2/17.  - Echo (8/17) with EF 55-60%, mild LVH.   SH: Lives with son in [place].  Nonsmoker.  No ETOH or drugs. Used to work at [employer]  Divorced.   FH: No known heart disease.   Review of systems complete and found to be negative unless listed in HPI.    Current Outpatient Medications  Medication Sig Dispense Refill  . albuterol (PROVENTIL HFA;VENTOLIN HFA) 108 (90 BASE) MCG/ACT inhaler Inhale into the lungs every 6 (six) hours as needed for wheezing or shortness of breath.    . carvedilol (COREG) 25 MG tablet Take 1 tablet (25 mg total) by mouth 2 (two) times daily. Needs office visit 68 tablet 0  . citalopram (CELEXA) 20 MG tablet Take 20 mg by mouth daily.    Marland Kitchen dexlansoprazole (DEXILANT) 60 MG capsule Take 60 mg by mouth daily.    . fexofenadine (ALLEGRA) 180 MG tablet Take 180 mg by mouth 2 (two) times daily.     . Fluticasone-Salmeterol (ADVAIR DISKUS) 250-50 MCG/DOSE AEPB Inhale 1 puff into the lungs 2 (two) times daily. 180 each 4  . furosemide (LASIX) 20 MG tablet Take 1 tablet (20 mg total) by mouth daily. 30 tablet 6  . hydrOXYzine (ATARAX/VISTARIL) 25 MG tablet Take 25 mg by mouth 3 (three) times daily.    . magnesium oxide (MAG-OX) 400 (241.3 Mg) MG tablet TAKE ONE TABLET  BY MOUTH ONCE DAILY 30 tablet 6  . potassium chloride SA (K-DUR,KLOR-CON) 20 MEQ tablet Take 1 tablet (20 mEq total) by mouth 2 (two) times daily. 60 tablet 6  . QNASL 80 MCG/ACT AERS Place 1 spray into the nose daily as needed (allergic rhinitis).     . sacubitril-valsartan (ENTRESTO) 24-26 MG Take 1 tablet by mouth 2 (two) times daily. 60 tablet 6  . spironolactone (ALDACTONE) 25 MG tablet Take 1 tablet (25 mg total) by mouth daily. Needs office visit 34 tablet 0  . traZODone (DESYREL) 50 MG tablet Take 50 mg by mouth at bedtime as needed for  sleep.     No current facility-administered medications for this encounter.    There were no vitals taken for this visit.   Wt Readings from Last 3 Encounters:  09/08/17 151 lb (68.5 kg)  05/28/17 160 lb (72.6 kg)  02/27/17 155 lb (70.3 kg)   Physical Exam Review of systems complete and found to be negative unless listed in HPI.    Assessment/Plan: 1. Chronic systolic CHF: Nonischemic cardiomyopathy.  Etiology uncertain: normal coronaries, no history of ETOH or drug abuse, no family history of cardiomyopathy.  No prior history of HTN.  Possible that it is related to viral myocarditis.   cMRI in 7/16 showed some improvement of EF to 31%.  Also showed LV dyssynchrony.  CPX in 9/16 showed a moderate functional limitation from CHF.  She had Medtronic CRT-D placed in 2/17. She had a good response to CRT with repeat echo in 8/17 showing EF 55-60%.   - Echo 02/2017 with stable EF in 55-60% range.  - NYHA II symptoms.  - Volume status stable on lasix 20 mg daily. BMET today.   - Continue Coreg 25 mg BID.  - Continue Entresto 24/26 mg BID.  - Continue spironolactone 25 mg BID - Reinforced fluid restriction to < 2 L daily, sodium restriction to less than 2000 mg daily, and the importance of daily weights.    Follow up in 6 months. Overall doing well.  Met with HFSW today to discuss continued use of HF Fund.   Shirley Friar, PA-C  10/27/2017   Greater than 50% of the 25 minute visit was spent in counseling/coordination of care regarding disease state education, salt/fluid restriction, sliding scale diuretics, and medication compliance.

## 2017-10-29 ENCOUNTER — Telehealth (HOSPITAL_COMMUNITY): Payer: Self-pay | Admitting: Cardiology

## 2017-10-29 ENCOUNTER — Other Ambulatory Visit (HOSPITAL_COMMUNITY): Payer: Self-pay | Admitting: Internal Medicine

## 2017-10-29 MED ORDER — ICOSAPENT ETHYL 1 G PO CAPS: 2.0000 g | ORAL_CAPSULE | Freq: Two times a day (BID) | ORAL | 11 refills | Status: DC

## 2017-10-29 NOTE — Telephone Encounter (Signed)
Patient aware. Patient voiced understanding  

## 2017-10-29 NOTE — Telephone Encounter (Signed)
-----   Message from Shirley Friar, PA-C sent at 10/29/2017  7:30 AM EST ----- Needs to start Vascepa 2 g BID for high triglycerides per DM.   Thanks!   Legrand Como [address]" [place], PA-C 10/29/2017 7:30 AM

## 2017-11-02 ENCOUNTER — Telehealth: Payer: Self-pay | Admitting: Internal Medicine

## 2017-11-02 MED FILL — SPIRONOLACTONE 25 MG TABLET: 25 | 30 days supply | Qty: 60 | Fill #0

## 2017-11-02 NOTE — Telephone Encounter (Signed)
Pt reporting diaphragmatic stim only occasionally, not even daily. I have educated her that it is not dangerous, just an annoyance. If it increases in frequency or is disrupting sleep- call back. She verbalizes understanding.

## 2017-11-02 NOTE — Telephone Encounter (Signed)
New Message    1. Has your device fired? no  2. Is you device beeping? No   3. Are you experiencing draining or swelling at device site? No  4. Are you calling to see if we received your device transmission? no  5. Have you passed out? No  Patient states that the device feels like its beeping funny. She wants to know can it be checked and someone call her.      Please route to [place]

## 2017-11-16 ENCOUNTER — Other Ambulatory Visit (HOSPITAL_COMMUNITY): Payer: Self-pay | Admitting: *Deleted

## 2017-11-16 ENCOUNTER — Telehealth (HOSPITAL_COMMUNITY): Payer: Self-pay | Admitting: Pharmacist

## 2017-11-16 MED ORDER — FISH OIL 1000 MG PO CAPS: 2000.0000 mg | ORAL_CAPSULE | Freq: Two times a day (BID) | ORAL | 3 refills | Status: DC

## 2017-11-16 NOTE — Telephone Encounter (Signed)
Patient does not have full Medicaid coverage so none of her medications are covered including Vascepa. Per rep, there are no patient assistance programs for these patients. Deferring to Dr. [name] for further recommendations.   Ruta Hinds. Velva Harman, PharmD, BCPS, CPP Clinical Pharmacist Phone: [phone] 11/16/2017 8:38 AM

## 2017-11-16 NOTE — Telephone Encounter (Signed)
Called and spoke with patient, she is agreeable with medication change.  Prescription sent to pharmacy and [facility] updated.

## 2017-11-16 NOTE — Telephone Encounter (Signed)
I guess she'll have to take over the counter fish oil 2 g bid.

## 2017-11-16 NOTE — Addendum Note (Signed)
Addended by: Darron Doom on: 11/16/2017 10:52 AM   Modules accepted: Orders

## 2017-12-01 ENCOUNTER — Ambulatory Visit (INDEPENDENT_AMBULATORY_CARE_PROVIDER_SITE_OTHER): Payer: Self-pay | Admitting: *Deleted

## 2017-12-01 DIAGNOSIS — I5022 Chronic systolic (congestive) heart failure: Secondary | ICD-10-CM

## 2017-12-01 DIAGNOSIS — I428 Other cardiomyopathies: Secondary | ICD-10-CM

## 2017-12-01 NOTE — Progress Notes (Signed)
Remote ICD transmission.   

## 2017-12-03 LAB — CUP PACEART REMOTE DEVICE CHECK
Battery Remaining Longevity: 26 mo
Battery Voltage: 2.89 V
Brady Statistic AP VP Percent: 0.24 %
Brady Statistic AP VS Percent: 0.01 %
Brady Statistic AS VP Percent: 99.72 %
Brady Statistic AS VS Percent: 0.03 %
Brady Statistic RA Percent Paced: 0.25 %
Brady Statistic RV Percent Paced: 99.95 %
Date Time Interrogation Session: 20190327052404
HighPow Impedance: 87 Ohm
Implantable Lead Implant Date: 20170206
Implantable Lead Implant Date: 20170206
Implantable Lead Implant Date: 20170206
Implantable Lead Location: 753858
Implantable Lead Location: 753859
Implantable Lead Location: 753860
Implantable Lead Model: 4398
Implantable Lead Model: 5076
Implantable Pulse Generator Implant Date: 20170206
Lead Channel Impedance Value: 1064 Ohm
Lead Channel Impedance Value: 1159 Ohm
Lead Channel Impedance Value: 1159 Ohm
Lead Channel Impedance Value: 1216 Ohm
Lead Channel Impedance Value: 1330 Ohm
Lead Channel Impedance Value: 1387 Ohm
Lead Channel Impedance Value: 494 Ohm
Lead Channel Impedance Value: 494 Ohm
Lead Channel Impedance Value: 646 Ohm
Lead Channel Impedance Value: 665 Ohm
Lead Channel Impedance Value: 760 Ohm
Lead Channel Impedance Value: 760 Ohm
Lead Channel Impedance Value: 817 Ohm
Lead Channel Pacing Threshold Amplitude: 0.375 V
Lead Channel Pacing Threshold Amplitude: 0.5 V
Lead Channel Pacing Threshold Amplitude: 4.75 V
Lead Channel Pacing Threshold Pulse Width: 0.4 ms
Lead Channel Pacing Threshold Pulse Width: 0.4 ms
Lead Channel Pacing Threshold Pulse Width: 1 ms
Lead Channel Sensing Intrinsic Amplitude: 1.75 mV
Lead Channel Sensing Intrinsic Amplitude: 1.75 mV
Lead Channel Sensing Intrinsic Amplitude: 10 mV
Lead Channel Sensing Intrinsic Amplitude: 10 mV
Lead Channel Setting Pacing Amplitude: 1.5 V
Lead Channel Setting Pacing Amplitude: 2 V
Lead Channel Setting Pacing Amplitude: 3.75 V
Lead Channel Setting Pacing Pulse Width: 0.4 ms
Lead Channel Setting Pacing Pulse Width: 1 ms
Lead Channel Setting Sensing Sensitivity: 0.3 mV

## 2017-12-06 ENCOUNTER — Encounter: Payer: Self-pay | Admitting: Cardiology

## 2018-01-06 MED FILL — SPIRONOLACTONE 25 MG TABLET: 25 | 30 days supply | Qty: 60 | Fill #1

## 2018-03-02 ENCOUNTER — Ambulatory Visit (INDEPENDENT_AMBULATORY_CARE_PROVIDER_SITE_OTHER): Payer: Self-pay | Admitting: *Deleted

## 2018-03-02 DIAGNOSIS — I428 Other cardiomyopathies: Secondary | ICD-10-CM

## 2018-03-02 DIAGNOSIS — I5022 Chronic systolic (congestive) heart failure: Secondary | ICD-10-CM

## 2018-03-02 NOTE — Progress Notes (Signed)
Remote ICD transmission.   

## 2018-03-03 ENCOUNTER — Encounter: Payer: Self-pay | Admitting: Cardiology

## 2018-03-03 LAB — CUP PACEART REMOTE DEVICE CHECK
Battery Remaining Longevity: 21 mo
Battery Voltage: 2.93 V
Brady Statistic AP VP Percent: 0.05 %
Brady Statistic AP VS Percent: 0 %
Brady Statistic AS VP Percent: 99.92 %
Brady Statistic AS VS Percent: 0.03 %
Brady Statistic RA Percent Paced: 0.05 %
Brady Statistic RV Percent Paced: 99.96 %
Date Time Interrogation Session: 20190626041805
HighPow Impedance: 67 Ohm
Implantable Lead Implant Date: 20170206
Implantable Lead Implant Date: 20170206
Implantable Lead Implant Date: 20170206
Implantable Lead Location: 753858
Implantable Lead Location: 753859
Implantable Lead Location: 753860
Implantable Lead Model: 4398
Implantable Lead Model: 5076
Implantable Pulse Generator Implant Date: 20170206
Lead Channel Impedance Value: 1064 Ohm
Lead Channel Impedance Value: 1102 Ohm
Lead Channel Impedance Value: 1121 Ohm
Lead Channel Impedance Value: 1330 Ohm
Lead Channel Impedance Value: 1330 Ohm
Lead Channel Impedance Value: 437 Ohm
Lead Channel Impedance Value: 494 Ohm
Lead Channel Impedance Value: 608 Ohm
Lead Channel Impedance Value: 665 Ohm
Lead Channel Impedance Value: 760 Ohm
Lead Channel Impedance Value: 760 Ohm
Lead Channel Impedance Value: 779 Ohm
Lead Channel Impedance Value: 988 Ohm
Lead Channel Pacing Threshold Amplitude: 0.375 V
Lead Channel Pacing Threshold Amplitude: 0.5 V
Lead Channel Pacing Threshold Amplitude: 4.75 V
Lead Channel Pacing Threshold Pulse Width: 0.4 ms
Lead Channel Pacing Threshold Pulse Width: 0.4 ms
Lead Channel Pacing Threshold Pulse Width: 1 ms
Lead Channel Sensing Intrinsic Amplitude: 1.25 mV
Lead Channel Sensing Intrinsic Amplitude: 1.25 mV
Lead Channel Sensing Intrinsic Amplitude: 10.625 mV
Lead Channel Sensing Intrinsic Amplitude: 10.625 mV
Lead Channel Setting Pacing Amplitude: 1.5 V
Lead Channel Setting Pacing Amplitude: 2 V
Lead Channel Setting Pacing Amplitude: 3.75 V
Lead Channel Setting Pacing Pulse Width: 0.4 ms
Lead Channel Setting Pacing Pulse Width: 1 ms
Lead Channel Setting Sensing Sensitivity: 0.3 mV

## 2018-03-07 ENCOUNTER — Other Ambulatory Visit (HOSPITAL_COMMUNITY): Payer: Self-pay | Admitting: Internal Medicine

## 2018-03-07 MED FILL — SPIRONOLACTONE 25 MG TABLET: 25 | 30 days supply | Qty: 60 | Fill #0

## 2018-06-01 ENCOUNTER — Telehealth: Payer: Self-pay

## 2018-06-01 ENCOUNTER — Ambulatory Visit (INDEPENDENT_AMBULATORY_CARE_PROVIDER_SITE_OTHER): Payer: Self-pay | Admitting: *Deleted

## 2018-06-01 DIAGNOSIS — I428 Other cardiomyopathies: Secondary | ICD-10-CM

## 2018-06-01 DIAGNOSIS — I5022 Chronic systolic (congestive) heart failure: Secondary | ICD-10-CM

## 2018-06-01 NOTE — Telephone Encounter (Signed)
Spoke with pt and reminded pt of remote transmission that is due today. Pt verbalized understanding.   

## 2018-06-02 ENCOUNTER — Encounter: Payer: Self-pay | Admitting: Cardiology

## 2018-06-02 NOTE — Progress Notes (Signed)
Letter  

## 2018-06-02 NOTE — Progress Notes (Signed)
Remote ICD transmission.   

## 2018-06-13 ENCOUNTER — Encounter: Payer: Self-pay | Admitting: Internal Medicine

## 2018-06-20 ENCOUNTER — Ambulatory Visit (HOSPITAL_COMMUNITY)
Admission: RE | Admit: 2018-06-20 | Discharge: 2018-06-20 | Disposition: A | Discharge status: Home / Self Care | Payer: Medicaid Other | Source: Ambulatory Visit | Attending: Cardiology | Admitting: Cardiology

## 2018-06-20 VITALS — BP 136/90 | HR 82 | Wt 161.0 lb

## 2018-06-20 DIAGNOSIS — K219 Gastro-esophageal reflux disease without esophagitis: Secondary | ICD-10-CM | POA: Insufficient documentation

## 2018-06-20 DIAGNOSIS — Z95810 Presence of automatic (implantable) cardiac defibrillator: Secondary | ICD-10-CM | POA: Insufficient documentation

## 2018-06-20 DIAGNOSIS — D573 Sickle-cell trait: Secondary | ICD-10-CM | POA: Insufficient documentation

## 2018-06-20 DIAGNOSIS — I5022 Chronic systolic (congestive) heart failure: Secondary | ICD-10-CM | POA: Insufficient documentation

## 2018-06-20 DIAGNOSIS — J45909 Unspecified asthma, uncomplicated: Secondary | ICD-10-CM | POA: Insufficient documentation

## 2018-06-20 DIAGNOSIS — I428 Other cardiomyopathies: Secondary | ICD-10-CM | POA: Insufficient documentation

## 2018-06-20 DIAGNOSIS — Z79899 Other long term (current) drug therapy: Secondary | ICD-10-CM | POA: Insufficient documentation

## 2018-06-20 LAB — BASIC METABOLIC PANEL
Anion gap: 7 (ref 5–15)
BUN: 13 mg/dL (ref 6–20)
CO2: 25 mmol/L (ref 22–32)
Calcium: 9.4 mg/dL (ref 8.9–10.3)
Chloride: 106 mmol/L (ref 98–111)
Creatinine, Ser: 0.96 mg/dL (ref 0.44–1.00)
GFR calc Af Amer: >60 mL/min (ref >60)
GFR calc non Af Amer: >60 mL/min (ref >60)
Glucose, Bld: 128 mg/dL — ABNORMAL HIGH (ref 70–99)
Potassium: 3.9 mmol/L (ref 3.5–5.1)
Sodium: 138 mmol/L (ref 135–145)

## 2018-06-20 MED FILL — SPIRONOLACTONE 25 MG TABLET: 25 | 34 days supply | Qty: 34 | Fill #0

## 2018-06-20 NOTE — Patient Instructions (Signed)
Labs today  Labs in 3 months  Your physician has requested that you have an echocardiogram. Echocardiography is a painless test that uses sound waves to create images of your heart. It provides your doctor with information about the size and shape of your heart and how well your heart's chambers and valves are working. This procedure takes approximately one hour. There are no restrictions for this procedure.  THIS WILL BE DONE AT [facility], THEY WILL CALL YOU TO SCHEDULE.    We will contact you in 6 months to schedule your next appointment.

## 2018-06-20 NOTE — Progress Notes (Signed)
Patient ID: Mary Pratt, female   DOB: 13-Feb-1971, 47 y.o.   MRN: [id number]    Advanced Heart Failure Clinic Note   PCP: Caryl Bis, MD Cardiology: Dr. [name]  47 y.o. presents for evaluation of nonischemic dilated cardiomyopathy.  She developed exertional dyspnea and orthopnea in 5/16.  She had had no prior known cardiac problems.  She was admitted in 6/16 with CHF.  Echo showed EF 20% with severe LV dilation.  Coronary angiography showed normal coronaries.  RHC showed preserved cardiac output.  She was diuresed and started on cardiac meds. Repeat echo in 12/16 showed EF 30% with septal-lateral dyssynchrony.  She had Medtronic CRT-D system placed in 2/17.  Most recent echo in 6/18 showed recovery of LV function, EF 55-60% with normal RV size and systolic function.    She presents today for followup of CHF.  She is stable.  She is short of breath walking up a flight of steps but no dyspnea walking on flat ground.  No chest pain.  No lightheadedness.  Has been taking her meds but just ran out of spironolactone.   Labs (7/17): digoxin 0.9 Labs (8/17): K 3.4, creatinine 1.13, BNP 7.1 Labs (2/19): LDL 62, TGs 302, K 4.3, creatinine 0.95  PMH: 1. GERD 2. Asthma 3. Sickle cell trait 4. H/o H pylori gastritis 5. Cardiomyopathy: Nonischemic.  Echo (6/16) with EF 20%, severe LV dilation, mild MR, PA systolic pressure 54 mmHg.  LHC/RHC (6/16) with EF 15%, CI 2.56 Fick/2.12 thermo, mean RA 1, PA 39/13 mean 26, mean PCWP 12, normal coronaries. cMRI (7/16) with moderately dilated LV with EF 31%, prominent LV septal-lateral dyssynchrony, normal RV size and systolic function, no myocardial LGE, so no definitive evidence for prior MI, Myocarditis, or infiltrative disease.  CPX (9/16) with peak VO2 15.9, VE/VCO2 slope 23.9, RER 1.09 => moderate functional impairment.  Echo (12/16) with EF 30%, moderate LVH, septal-lateral dyssynchrony, normal RV size and systolic function.  - Medtronic CRT-D placed  2/17.  - Echo (8/17) with EF 55-60%, mild LVH.  - Echo (6/18) with EF 55-60%, normal RV size and systolic function  SH: Lives with son in [place].  Nonsmoker.  No ETOH or drugs. Used to work at [employer]  Divorced.   FH: No known heart disease.   Review of systems complete and found to be negative unless listed in HPI.    Current Outpatient Medications  Medication Sig Dispense Refill  . albuterol (PROVENTIL HFA;VENTOLIN HFA) 108 (90 BASE) MCG/ACT inhaler Inhale into the lungs every 6 (six) hours as needed for wheezing or shortness of breath.    . carvedilol (COREG) 25 MG tablet Take 1 tablet (25 mg total) by mouth 2 (two) times daily. HF FUND 10/27/2017 68 tablet 2  . citalopram (CELEXA) 20 MG tablet Take 20 mg by mouth daily.    Marland Kitchen dexlansoprazole (DEXILANT) 60 MG capsule Take 60 mg by mouth daily.    . fexofenadine (ALLEGRA) 180 MG tablet Take 180 mg by mouth 2 (two) times daily.     . Fluticasone-Salmeterol (ADVAIR DISKUS) 250-50 MCG/DOSE AEPB Inhale 1 puff into the lungs 2 (two) times daily. 180 each 4  . furosemide (LASIX) 20 MG tablet Take 1 tablet (20 mg total) by mouth daily. 30 tablet 6  . hydrOXYzine (ATARAX/VISTARIL) 25 MG tablet Take 25 mg by mouth 3 (three) times daily.    . magnesium oxide (MAG-OX) 400 (241.3 Mg) MG tablet TAKE ONE TABLET BY MOUTH ONCE DAILY 30 tablet 6  .  Omega-3 Fatty Acids (FISH OIL) 1000 MG CAPS Take 2 capsules (2,000 mg total) by mouth 2 (two) times daily. 120 capsule 3  . potassium chloride SA (K-DUR,KLOR-CON) 20 MEQ tablet Take 1 tablet (20 mEq total) by mouth 2 (two) times daily. 60 tablet 6  . QNASL 80 MCG/ACT AERS Place 1 spray into the nose daily as needed (allergic rhinitis).     . sacubitril-valsartan (ENTRESTO) 24-26 MG Take 1 tablet by mouth 2 (two) times daily. 60 tablet 6  . spironolactone (ALDACTONE) 25 MG tablet Take 1 tablet (25 mg total) by mouth daily. HF FUND 10/27/2017 34 tablet 2  . traZODone (DESYREL) 50 MG tablet Take 50 mg by mouth at  bedtime as needed for sleep.     No current facility-administered medications for this encounter.    BP 136/90   Pulse 82   Wt 73 kg (161 lb)   SpO2 95%   BMI 30.42 kg/m    Wt Readings from Last 3 Encounters:  06/20/18 73 kg (161 lb)  10/27/17 72.1 kg (159 lb)  09/08/17 68.5 kg (151 lb)   General: NAD Neck: No JVD, no thyromegaly or thyroid nodule.  Lungs: Clear to auscultation bilaterally with normal respiratory effort. CV: Nondisplaced PMI.  Heart regular S1/S2, no S3/S4, no murmur.  No peripheral edema.  No carotid bruit.  Normal pedal pulses.  Abdomen: Soft, nontender, no hepatosplenomegaly, no distention.  Skin: Intact without lesions or rashes.  Neurologic: Alert and oriented x 3.  Psych: Normal affect. Extremities: No clubbing or cyanosis.  HEENT: Normal.   Assessment/Plan: 1. Chronic systolic CHF: Nonischemic cardiomyopathy.  Etiology uncertain: normal coronaries, no history of ETOH or drug abuse, no family history of cardiomyopathy.  No prior history of HTN.  Possible that it is related to viral myocarditis.   cMRI in 7/16 showed some improvement of EF to 31%.  Also showed LV dyssynchrony.  CPX in 9/16 showed a moderate functional limitation from CHF.  She had Medtronic CRT-D placed in 2/17. She had a good response to CRT with repeat echo in 8/17 and again in 6/18 showing EF 55-60%.  NYHA class II symptoms, she is not volume overloaded on exam.  - Continue lasix 20 mg every other day.  BMET today.  - Continue Coreg 25 mg BID - Continue Entresto 24/26 mg BID.  - Continue spironolactone 25 mg daily.  - I will get a repeat echo this year.  If EF remains normal, can hold off on additional echoes unless symptoms change.    - She will need a BMET every 3 months with spironolactone and Entresto use.    Followup in 3 months.   [name], MD  06/20/2018

## 2018-07-05 ENCOUNTER — Other Ambulatory Visit: Payer: Self-pay | Admitting: Internal Medicine

## 2018-07-05 ENCOUNTER — Telehealth (HOSPITAL_COMMUNITY): Payer: Self-pay | Admitting: Licensed Clinical Social Worker

## 2018-07-05 NOTE — Telephone Encounter (Signed)
CSW called patient to check in on current insurance status.  Patient reports that she has submitted disability/medicaid but it was denied- currently appealing with the help of a lawyer and awaiting determination.  Pt working part time but unsure if she can continue this given current health concerns- hopeful she will hear something regarding disability soon.  CSW will continue to follow in clinic and assist as needed  Jorge Ny, [place] Worker [facility] [phone]

## 2018-07-06 ENCOUNTER — Encounter: Payer: Medicaid Other | Admitting: Internal Medicine

## 2018-07-11 ENCOUNTER — Encounter: Payer: Self-pay | Admitting: Internal Medicine

## 2018-07-12 LAB — CUP PACEART REMOTE DEVICE CHECK
Battery Remaining Longevity: 18 mo
Battery Voltage: 2.92 V
Brady Statistic AP VP Percent: 0.03 %
Brady Statistic AP VS Percent: 0 %
Brady Statistic AS VP Percent: 99.94 %
Brady Statistic AS VS Percent: 0.03 %
Brady Statistic RA Percent Paced: 0.03 %
Brady Statistic RV Percent Paced: 99.97 %
Date Time Interrogation Session: 20190925191812
HighPow Impedance: 67 Ohm
Implantable Lead Implant Date: 20170206
Implantable Lead Implant Date: 20170206
Implantable Lead Implant Date: 20170206
Implantable Lead Location: 753858
Implantable Lead Location: 753859
Implantable Lead Location: 753860
Implantable Lead Model: 4398
Implantable Lead Model: 5076
Implantable Pulse Generator Implant Date: 20170206
Lead Channel Impedance Value: 1064 Ohm
Lead Channel Impedance Value: 1102 Ohm
Lead Channel Impedance Value: 1121 Ohm
Lead Channel Impedance Value: 1273 Ohm
Lead Channel Impedance Value: 1311 Ohm
Lead Channel Impedance Value: 456 Ohm
Lead Channel Impedance Value: 494 Ohm
Lead Channel Impedance Value: 570 Ohm
Lead Channel Impedance Value: 608 Ohm
Lead Channel Impedance Value: 722 Ohm
Lead Channel Impedance Value: 722 Ohm
Lead Channel Impedance Value: 779 Ohm
Lead Channel Impedance Value: 988 Ohm
Lead Channel Pacing Threshold Amplitude: 0.375 V
Lead Channel Pacing Threshold Amplitude: 0.5 V
Lead Channel Pacing Threshold Amplitude: 4.75 V
Lead Channel Pacing Threshold Pulse Width: 0.4 ms
Lead Channel Pacing Threshold Pulse Width: 0.4 ms
Lead Channel Pacing Threshold Pulse Width: 1 ms
Lead Channel Sensing Intrinsic Amplitude: 0.75 mV
Lead Channel Sensing Intrinsic Amplitude: 0.75 mV
Lead Channel Sensing Intrinsic Amplitude: 7.75 mV
Lead Channel Sensing Intrinsic Amplitude: 7.75 mV
Lead Channel Setting Pacing Amplitude: 1.5 V
Lead Channel Setting Pacing Amplitude: 2 V
Lead Channel Setting Pacing Amplitude: 3.75 V
Lead Channel Setting Pacing Pulse Width: 0.4 ms
Lead Channel Setting Pacing Pulse Width: 1 ms
Lead Channel Setting Sensing Sensitivity: 0.3 mV

## 2018-08-01 ENCOUNTER — Ambulatory Visit (INDEPENDENT_AMBULATORY_CARE_PROVIDER_SITE_OTHER): Payer: Self-pay | Admitting: Internal Medicine

## 2018-08-01 ENCOUNTER — Other Ambulatory Visit: Payer: Self-pay

## 2018-08-01 ENCOUNTER — Ambulatory Visit (HOSPITAL_COMMUNITY): Payer: Self-pay | Attending: Internal Medicine

## 2018-08-01 ENCOUNTER — Encounter: Payer: Self-pay | Admitting: Internal Medicine

## 2018-08-01 VITALS — BP 124/82 | HR 68 | Ht 61.0 in | Wt 160.6 lb

## 2018-08-01 DIAGNOSIS — I480 Paroxysmal atrial fibrillation: Secondary | ICD-10-CM

## 2018-08-01 DIAGNOSIS — Z95810 Presence of automatic (implantable) cardiac defibrillator: Secondary | ICD-10-CM

## 2018-08-01 DIAGNOSIS — I5022 Chronic systolic (congestive) heart failure: Secondary | ICD-10-CM

## 2018-08-01 LAB — CUP PACEART INCLINIC DEVICE CHECK
Battery Remaining Longevity: 17 mo
Battery Voltage: 2.92 V
Brady Statistic AP VP Percent: 0.11 %
Brady Statistic AP VS Percent: 0 %
Brady Statistic AS VP Percent: 99.85 %
Brady Statistic AS VS Percent: 0.03 %
Brady Statistic RA Percent Paced: 0.12 %
Brady Statistic RV Percent Paced: 99.95 %
Date Time Interrogation Session: 20191125171427
HighPow Impedance: 65 Ohm
Implantable Lead Implant Date: 20170206
Implantable Lead Implant Date: 20170206
Implantable Lead Implant Date: 20170206
Implantable Lead Location: 753858
Implantable Lead Location: 753859
Implantable Lead Location: 753860
Implantable Lead Model: 4398
Implantable Lead Model: 5076
Implantable Pulse Generator Implant Date: 20170206
Lead Channel Impedance Value: 1064 Ohm
Lead Channel Impedance Value: 1064 Ohm
Lead Channel Impedance Value: 1121 Ohm
Lead Channel Impedance Value: 1273 Ohm
Lead Channel Impedance Value: 1311 Ohm
Lead Channel Impedance Value: 456 Ohm
Lead Channel Impedance Value: 494 Ohm
Lead Channel Impedance Value: 551 Ohm
Lead Channel Impedance Value: 608 Ohm
Lead Channel Impedance Value: 703 Ohm
Lead Channel Impedance Value: 722 Ohm
Lead Channel Impedance Value: 722 Ohm
Lead Channel Impedance Value: 931 Ohm
Lead Channel Pacing Threshold Amplitude: 0.375 V
Lead Channel Pacing Threshold Amplitude: 0.5 V
Lead Channel Pacing Threshold Amplitude: 4.75 V
Lead Channel Pacing Threshold Pulse Width: 0.4 ms
Lead Channel Pacing Threshold Pulse Width: 0.4 ms
Lead Channel Pacing Threshold Pulse Width: 1 ms
Lead Channel Sensing Intrinsic Amplitude: 1.375 mV
Lead Channel Sensing Intrinsic Amplitude: 1.625 mV
Lead Channel Sensing Intrinsic Amplitude: 11 mV
Lead Channel Sensing Intrinsic Amplitude: 11.875 mV
Lead Channel Setting Pacing Amplitude: 1.5 V
Lead Channel Setting Pacing Amplitude: 2 V
Lead Channel Setting Pacing Amplitude: 3.75 V
Lead Channel Setting Pacing Pulse Width: 0.4 ms
Lead Channel Setting Pacing Pulse Width: 1 ms
Lead Channel Setting Sensing Sensitivity: 0.3 mV

## 2018-08-01 MED FILL — SPIRONOLACTONE 25 MG TABLET: 25 | 34 days supply | Qty: 34 | Fill #1

## 2018-08-01 NOTE — Patient Instructions (Signed)
Medication Instructions:  Your physician recommends that you continue on your current medications as directed. Please refer to the Current Medication list given to you today.  Labwork: None ordered.  Testing/Procedures: None ordered.  Follow-Up: Your physician recommends that you schedule a follow-up appointment in:   One Year with Dr Caryl Comes  Remote monitoring is used to monitor your Pacemaker of ICD from home. This monitoring reduces the number of office visits required to check your device to one time per year. It allows [place] to keep an eye on the functioning of your device to ensure it is working properly. You are scheduled for a device check from home on 12/6. You may send your transmission at any time that day. If you have a wireless device, the transmission will be sent automatically. After your physician reviews your transmission, you will receive a postcard with your next transmission date.    Any Other Special Instructions Will Be Listed Below (If Applicable).     If you need a refill on your cardiac medications before your next appointment, please call your pharmacy.

## 2018-08-01 NOTE — Progress Notes (Signed)
Patient Care Team: Caryl Bis, MD as PCP - General (Family Medicine)   HPI  Mary Pratt is a 47 y.o. female Seen in follow-up for CRT-D implanted 2/17 for nonischemic cardiomyopathy and congestive heart failure   She is improved following reprogramming.  She is short of breath on the stairs but not on the flats.  No nocturnal dyspnea orthopnea.  No chest pain.  Date Cr K Hgb  10/19 0.96 3.9 12      Records and Results Reviewed--notes from CHF clinic  Past Medical History:  Diagnosis Date  . 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 Anxiety   . Asthma   . Cardiac defibrillator in place   . Chronic systolic CHF (congestive heart failure) ([place])    a. 02/2015 Echo: EF 20%, sev dil w/ diff HK, worse @ inf base. Tiv AI, mild MR, mod dil LA, PASP 19mmHg.  . Functional dyspepsia    early satiety   . GERD with stricture    dilated 2010  . HELICOBACTER PYLORI GASTRITIS 01/15/2009   dyspnea, ? reaction to Biaxin/amoxicillin  Pylera 01/15/09 - completed therapy     . NICM (nonischemic cardiomyopathy) ([place])    a. 02/2015 Cath: nl cors, EF 15%, mild PAH;  b. 02/2015 Enrolled in VEST trial.  . Sickle cell trait ([place])   . Situational depression    "son died"    Past Surgical History:  Procedure Laterality Date  . CARDIAC CATHETERIZATION N/A 02/26/2015   Procedure: Right/Left Heart Cath and Coronary Angiography;  Surgeon: Troy Sine, MD;  Location: [facility];  Service: Cardiovascular;  Laterality: N/A;  . COLPOSCOPY  2013   neg  . EP IMPLANTABLE DEVICE N/A 10/14/2015   Procedure: BiV ICD Insertion CRT-D;  Surgeon: Deboraha Sprang, MD;  Location: [facility];  Service: Cardiovascular;  Laterality: N/A;  . ESOPHAGOGASTRODUODENOSCOPY (EGD) WITH ESOPHAGEAL DILATION  11/30/2008   esophageal ring dilated 75 French, H. pylori gastritis  . TUBAL LIGATION  1991  . WISDOM TOOTH EXTRACTION      Current Outpatient Medications  Medication Sig Dispense  Refill  . albuterol (PROVENTIL HFA;VENTOLIN HFA) 108 (90 BASE) MCG/ACT inhaler Inhale into the lungs every 6 (six) hours as needed for wheezing or shortness of breath.    . carvedilol (COREG) 25 MG tablet Take 1 tablet (25 mg total) by mouth 2 (two) times daily. HF FUND 10/27/2017 68 tablet 2  . citalopram (CELEXA) 20 MG tablet Take 20 mg by mouth daily.    Marland Kitchen dexlansoprazole (DEXILANT) 60 MG capsule Take 60 mg by mouth daily.    . fexofenadine (ALLEGRA) 180 MG tablet Take 180 mg by mouth 2 (two) times daily.     . Fluticasone-Salmeterol (ADVAIR DISKUS) 250-50 MCG/DOSE AEPB Inhale 1 puff into the lungs 2 (two) times daily. 180 each 4  . furosemide (LASIX) 20 MG tablet Take 1 tablet (20 mg total) by mouth daily. 30 tablet 6  . hydrOXYzine (ATARAX/VISTARIL) 25 MG tablet Take 25 mg by mouth 3 (three) times daily.    . magnesium oxide (MAG-OX) 400 (241.3 Mg) MG tablet TAKE ONE TABLET BY MOUTH ONCE DAILY 30 tablet 6  . Omega-3 Fatty Acids (FISH OIL) 1000 MG CAPS Take 2 capsules (2,000 mg total) by mouth 2 (two) times daily. 120 capsule 3  . potassium chloride SA (K-DUR,KLOR-CON) 20 MEQ tablet Take 1 tablet (20 mEq total) by mouth 2 (two) times  daily. 60 tablet 6  . QNASL 80 MCG/ACT AERS Place 1 spray into the nose daily as needed (allergic rhinitis).     . sacubitril-valsartan (ENTRESTO) 24-26 MG Take 1 tablet by mouth 2 (two) times daily. 60 tablet 6  . spironolactone (ALDACTONE) 25 MG tablet Take 1 tablet (25 mg total) by mouth daily. HF FUND 10/27/2017 34 tablet 2  . traZODone (DESYREL) 50 MG tablet Take 50 mg by mouth at bedtime as needed for sleep.     No current facility-administered medications for this visit.     Allergies  Allergen Reactions  . Amoxicillin Other (See Comments)    REACTION: Tacycardia  . Clarithromycin Hives    Breaks out and gets whelps      Review of Systems negative except from HPI and PMH  Physical Exam BP 124/82   Pulse 68   Ht 5\' 1"  (1.549 m)   Wt 160 lb 9.6  oz (72.8 kg)   SpO2 95%   BMI 30.35 kg/m  Well developed and nourished in no acute distress HENT normal Neck supple with JVP-flat Clear Regular rate and rhythm, no murmurs or gallops Abd-soft with active BS No Clubbing cyanosis edema Skin-warm and dry A & Oriented  Grossly normal sensory and motor function   ECG demonstrates P synchronous pacing at 68 Interval 17/12/46 QRS negative lead I and negative lead V1   Assessment and  Plan NICM  CHF Chronic systolic  CRT-Medtronic  Improved with device reprogramming  We will decrease LV outputs to just half of multiple threshold for battery longevity  Euvolemic continue current meds

## 2018-09-01 ENCOUNTER — Ambulatory Visit (INDEPENDENT_AMBULATORY_CARE_PROVIDER_SITE_OTHER): Payer: Self-pay

## 2018-09-01 DIAGNOSIS — I428 Other cardiomyopathies: Secondary | ICD-10-CM

## 2018-09-01 DIAGNOSIS — I5022 Chronic systolic (congestive) heart failure: Secondary | ICD-10-CM

## 2018-09-01 NOTE — Progress Notes (Signed)
Remote ICD transmission.   

## 2018-09-04 LAB — CUP PACEART REMOTE DEVICE CHECK
Battery Remaining Longevity: 17 mo
Battery Voltage: 2.86 V
Brady Statistic AP VP Percent: 0 %
Brady Statistic AP VS Percent: 0 %
Brady Statistic AS VP Percent: 99.95 %
Brady Statistic AS VS Percent: 0.04 %
Brady Statistic RA Percent Paced: 0.01 %
Brady Statistic RV Percent Paced: 99.95 %
Date Time Interrogation Session: 20191227070007
HighPow Impedance: 76 Ohm
Implantable Lead Implant Date: 20170206
Implantable Lead Implant Date: 20170206
Implantable Lead Implant Date: 20170206
Implantable Lead Location: 753858
Implantable Lead Location: 753859
Implantable Lead Location: 753860
Implantable Lead Model: 4398
Implantable Lead Model: 5076
Implantable Pulse Generator Implant Date: 20170206
Lead Channel Impedance Value: 1007 Ohm
Lead Channel Impedance Value: 1102 Ohm
Lead Channel Impedance Value: 1121 Ohm
Lead Channel Impedance Value: 1178 Ohm
Lead Channel Impedance Value: 1311 Ohm
Lead Channel Impedance Value: 1330 Ohm
Lead Channel Impedance Value: 456 Ohm
Lead Channel Impedance Value: 494 Ohm
Lead Channel Impedance Value: 570 Ohm
Lead Channel Impedance Value: 608 Ohm
Lead Channel Impedance Value: 722 Ohm
Lead Channel Impedance Value: 722 Ohm
Lead Channel Impedance Value: 779 Ohm
Lead Channel Pacing Threshold Amplitude: 0.375 V
Lead Channel Pacing Threshold Amplitude: 0.625 V
Lead Channel Pacing Threshold Amplitude: 4.75 V
Lead Channel Pacing Threshold Pulse Width: 0.4 ms
Lead Channel Pacing Threshold Pulse Width: 0.4 ms
Lead Channel Pacing Threshold Pulse Width: 1 ms
Lead Channel Sensing Intrinsic Amplitude: 1 mV
Lead Channel Sensing Intrinsic Amplitude: 1 mV
Lead Channel Sensing Intrinsic Amplitude: 11 mV
Lead Channel Sensing Intrinsic Amplitude: 11.875 mV
Lead Channel Setting Pacing Amplitude: 1.5 V
Lead Channel Setting Pacing Amplitude: 2 V
Lead Channel Setting Pacing Amplitude: 3 V
Lead Channel Setting Pacing Pulse Width: 0.4 ms
Lead Channel Setting Pacing Pulse Width: 1 ms
Lead Channel Setting Sensing Sensitivity: 0.3 mV

## 2018-09-20 ENCOUNTER — Ambulatory Visit (HOSPITAL_COMMUNITY)
Admission: RE | Admit: 2018-09-20 | Discharge: 2018-09-20 | Disposition: A | Discharge status: Home / Self Care | Payer: Medicaid Other | Source: Ambulatory Visit | Attending: Internal Medicine | Admitting: Internal Medicine

## 2018-09-20 DIAGNOSIS — I5022 Chronic systolic (congestive) heart failure: Secondary | ICD-10-CM | POA: Insufficient documentation

## 2018-09-20 LAB — BASIC METABOLIC PANEL
Anion gap: 9 (ref 5–15)
BUN: 9 mg/dL (ref 6–20)
CO2: 24 mmol/L (ref 22–32)
Calcium: 9.1 mg/dL (ref 8.9–10.3)
Chloride: 105 mmol/L (ref 98–111)
Creatinine, Ser: 0.98 mg/dL (ref 0.44–1.00)
GFR calc Af Amer: >60 mL/min (ref >60)
GFR calc non Af Amer: >60 mL/min (ref >60)
Glucose, Bld: 75 mg/dL (ref 70–99)
Potassium: 3.6 mmol/L (ref 3.5–5.1)
Sodium: 138 mmol/L (ref 135–145)

## 2018-09-27 ENCOUNTER — Telehealth (HOSPITAL_COMMUNITY): Payer: Self-pay | Admitting: Licensed Clinical Social Worker

## 2018-09-27 NOTE — Telephone Encounter (Signed)
CSW called pt to discuss upcoming re-enrollment for [employer] for entresto assistance.  Pt acknowledges that she will need to re-enroll this year since she still does not have insurance coverage-pt has not received anything in the mail from [employer] at this time.  CSW mailed application to pt for completion  CSW will continue to follow and assist as needed  Jorge Ny, [place] Worker [facility] [phone]

## 2018-11-08 MED FILL — POTASSIUM CHLORIDE CRYS ER: 20 | 34 days supply | Qty: 68 | Fill #0

## 2018-11-08 MED FILL — SPIRONOLACTONE 25 MG TABLET: 25 | 34 days supply | Qty: 34 | Fill #2

## 2018-11-08 MED FILL — FUROSEMIDE 20 MG TABS: 20 | 34 days supply | Qty: 34 | Fill #0

## 2018-11-08 MED FILL — CARVEDILOL 25 MG TABLET: 25 | 34 days supply | Qty: 68 | Fill #0

## 2018-12-01 ENCOUNTER — Other Ambulatory Visit: Payer: Self-pay

## 2018-12-01 ENCOUNTER — Ambulatory Visit (INDEPENDENT_AMBULATORY_CARE_PROVIDER_SITE_OTHER): Payer: Self-pay | Admitting: *Deleted

## 2018-12-01 DIAGNOSIS — I428 Other cardiomyopathies: Secondary | ICD-10-CM

## 2018-12-01 LAB — CUP PACEART REMOTE DEVICE CHECK
Battery Remaining Longevity: 19 mo
Battery Voltage: 2.92 V
Brady Statistic AP VP Percent: 0.08 %
Brady Statistic AP VS Percent: 0 %
Brady Statistic AS VP Percent: 99.89 %
Brady Statistic AS VS Percent: 0.03 %
Brady Statistic RA Percent Paced: 0.09 %
Brady Statistic RV Percent Paced: 99.96 %
Date Time Interrogation Session: 20200326073523
HighPow Impedance: 72 Ohm
Implantable Lead Implant Date: 20170206
Implantable Lead Implant Date: 20170206
Implantable Lead Implant Date: 20170206
Implantable Lead Location: 753858
Implantable Lead Location: 753859
Implantable Lead Location: 753860
Implantable Lead Model: 4398
Implantable Lead Model: 5076
Implantable Pulse Generator Implant Date: 20170206
Lead Channel Impedance Value: 1102 Ohm
Lead Channel Impedance Value: 1102 Ohm
Lead Channel Impedance Value: 1121 Ohm
Lead Channel Impedance Value: 1273 Ohm
Lead Channel Impedance Value: 1273 Ohm
Lead Channel Impedance Value: 456 Ohm
Lead Channel Impedance Value: 494 Ohm
Lead Channel Impedance Value: 494 Ohm
Lead Channel Impedance Value: 551 Ohm
Lead Channel Impedance Value: 703 Ohm
Lead Channel Impedance Value: 760 Ohm
Lead Channel Impedance Value: 760 Ohm
Lead Channel Impedance Value: 988 Ohm
Lead Channel Pacing Threshold Amplitude: 0.375 V
Lead Channel Pacing Threshold Amplitude: 0.5 V
Lead Channel Pacing Threshold Amplitude: 4.75 V
Lead Channel Pacing Threshold Pulse Width: 0.4 ms
Lead Channel Pacing Threshold Pulse Width: 0.4 ms
Lead Channel Pacing Threshold Pulse Width: 1 ms
Lead Channel Sensing Intrinsic Amplitude: 2.5 mV
Lead Channel Sensing Intrinsic Amplitude: 2.5 mV
Lead Channel Sensing Intrinsic Amplitude: 9.125 mV
Lead Channel Sensing Intrinsic Amplitude: 9.125 mV
Lead Channel Setting Pacing Amplitude: 1.5 V
Lead Channel Setting Pacing Amplitude: 2 V
Lead Channel Setting Pacing Amplitude: 3 V
Lead Channel Setting Pacing Pulse Width: 0.4 ms
Lead Channel Setting Pacing Pulse Width: 1 ms
Lead Channel Setting Sensing Sensitivity: 0.3 mV

## 2018-12-07 ENCOUNTER — Encounter: Payer: Self-pay | Admitting: Cardiology

## 2018-12-07 NOTE — Progress Notes (Signed)
Remote ICD transmission.   

## 2018-12-12 ENCOUNTER — Other Ambulatory Visit: Payer: Self-pay

## 2018-12-12 ENCOUNTER — Encounter (HOSPITAL_COMMUNITY): Payer: Self-pay

## 2018-12-12 ENCOUNTER — Ambulatory Visit (HOSPITAL_COMMUNITY)
Admission: RE | Admit: 2018-12-12 | Discharge: 2018-12-12 | Disposition: A | Discharge status: Home / Self Care | Payer: Medicaid Other | Source: Ambulatory Visit | Attending: Internal Medicine | Admitting: Internal Medicine

## 2018-12-12 ENCOUNTER — Other Ambulatory Visit (HOSPITAL_COMMUNITY): Payer: Self-pay

## 2018-12-12 VITALS — BP 127/85 | HR 71 | Wt 155.0 lb

## 2018-12-12 DIAGNOSIS — I5022 Chronic systolic (congestive) heart failure: Secondary | ICD-10-CM

## 2018-12-12 DIAGNOSIS — I428 Other cardiomyopathies: Secondary | ICD-10-CM

## 2018-12-12 DIAGNOSIS — R5383 Other fatigue: Secondary | ICD-10-CM

## 2018-12-12 MED ORDER — FUROSEMIDE 20 MG PO TABS: 20.0000 mg | ORAL_TABLET | Freq: Every day | ORAL | 3 refills | Status: DC

## 2018-12-12 MED ORDER — CARVEDILOL 25 MG PO TABS: 25.0000 mg | ORAL_TABLET | Freq: Two times a day (BID) | ORAL | 3 refills | Status: DC

## 2018-12-12 MED ORDER — SPIRONOLACTONE 25 MG PO TABS: 25.0000 mg | ORAL_TABLET | Freq: Every day | ORAL | 3 refills | Status: DC

## 2018-12-12 MED FILL — FUROSEMIDE 20 MG TABS: 20 | 30 days supply | Qty: 30 | Fill #0

## 2018-12-12 MED FILL — SPIRONOLACTONE 25 MG TABS: 25 | 30 days supply | Qty: 30 | Fill #0

## 2018-12-12 MED FILL — CARVEDILOL 25 MG TABLET: 25 | 30 days supply | Qty: 60 | Fill #0

## 2018-12-12 NOTE — Addendum Note (Signed)
Encounter addended by: Jovita Kussmaul, RN on: 12/12/2018 2:47 PM  Actions taken: Clinical Note Signed, Pharmacy for encounter modified, Order list changed, Diagnosis association updated

## 2018-12-12 NOTE — Progress Notes (Signed)
Heart Failure TeleHealth Note  Due to national recommendations of social distancing due to [place] 19, Audio/video telehealth visit is felt to be most appropriate for this patient at this time.  See MyChart message from today for patient consent regarding telehealth for [facility].  Date:  12/12/2018   ID:  Mary Pratt, DOB June 18, 1971, MRN [id number]  Location: Home  Provider location: [place] Type of Visit: Established patient PCP:  Caryl Bis, MD  Cardiologist:  No primary care provider on file. Primary HF: Dr [name]   Chief Complaint: Heart Failure    History of Present Illness: AMIYRAH Pratt is a 48 y.o. female who presents via audio conferencing for a telehealth visit today.     Mary Pratt is a 48 y.o. with a history of nonischemic dilated cardiomyopathy and asthma.   She developed exertional dyspnea and orthopnea in 5/16.  She had had no prior known cardiac problems.  She was admitted in 6/16 with CHF.  Echo showed EF 20% with severe LV dilation.  Coronary angiography showed normal coronaries.  RHC showed preserved cardiac output.  Repeat echo in 12/16 showed EF 30% with septal-lateral dyssynchrony.  She had Medtronic CRT-D system placed in 2017.  Most recent echo in 07/2018 showed EF 60-65% with showed recovery of LV function.    Overall she is feeling ok but having ongoing fatigue., Today, she denies symptoms of palpitations, chest pain, shortness of breath, orthopnea, PND, lower extremity edema, claudication, dizziness, presyncope, syncope, or bleeding. The patient is tolerating medications without difficulties and is otherwise without complaint today.  Continues to work part time at [employer]. She has been unable to obtain disability.   She denies symptoms of cough, fevers, chills, or new SOB worrisome for COVID 19.   Past Medical History:  Diagnosis Date  . 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 Anxiety   . Asthma   .  Cardiac defibrillator in place   . Chronic systolic CHF (congestive heart failure) ([place])    a. 02/2015 Echo: EF 20%, sev dil w/ diff HK, worse @ inf base. Tiv AI, mild MR, mod dil LA, PASP 59mmHg.  . Functional dyspepsia    early satiety   . GERD with stricture    dilated 2010  . HELICOBACTER PYLORI GASTRITIS 01/15/2009   dyspnea, ? reaction to Biaxin/amoxicillin  Pylera 01/15/09 - completed therapy     . NICM (nonischemic cardiomyopathy) ([place])    a. 02/2015 Cath: nl cors, EF 15%, mild PAH;  b. 02/2015 Enrolled in VEST trial.  . Sickle cell trait ([place])   . Situational depression    "son died"   Past Surgical History:  Procedure Laterality Date  . CARDIAC CATHETERIZATION N/A 02/26/2015   Procedure: Right/Left Heart Cath and Coronary Angiography;  Surgeon: Troy Sine, MD;  Location: [facility];  Service: Cardiovascular;  Laterality: N/A;  . COLPOSCOPY  2013   neg  . EP IMPLANTABLE DEVICE N/A 10/14/2015   Procedure: BiV ICD Insertion CRT-D;  Surgeon: Deboraha Sprang, MD;  Location: [facility];  Service: Cardiovascular;  Laterality: N/A;  . ESOPHAGOGASTRODUODENOSCOPY (EGD) WITH ESOPHAGEAL DILATION  11/30/2008   esophageal ring dilated 79 French, H. pylori gastritis  . TUBAL LIGATION  1991  . WISDOM TOOTH EXTRACTION       Current Outpatient Medications  Medication Sig Dispense Refill  . albuterol (PROVENTIL HFA;VENTOLIN HFA) 108 (90 BASE) MCG/ACT  inhaler Inhale into the lungs every 6 (six) hours as needed for wheezing or shortness of breath.    . busPIRone (BUSPAR) 15 MG tablet Take 15 mg by mouth 3 (three) times daily.    . carvedilol (COREG) 25 MG tablet Take 1 tablet (25 mg total) by mouth 2 (two) times daily. HF FUND 10/27/2017 68 tablet 2  . citalopram (CELEXA) 20 MG tablet Take 20 mg by mouth daily.    Marland Kitchen dexlansoprazole (DEXILANT) 60 MG capsule Take 60 mg by mouth daily.    . fexofenadine (ALLEGRA) 180 MG tablet Take 180 mg by mouth 2 (two) times daily.     .  Fluticasone-Salmeterol (ADVAIR DISKUS) 250-50 MCG/DOSE AEPB Inhale 1 puff into the lungs 2 (two) times daily. 180 each 4  . furosemide (LASIX) 20 MG tablet Take 1 tablet (20 mg total) by mouth daily. 30 tablet 6  . hydrOXYzine (ATARAX/VISTARIL) 25 MG tablet Take 25 mg by mouth 3 (three) times daily.    . magnesium oxide (MAG-OX) 400 (241.3 Mg) MG tablet TAKE ONE TABLET BY MOUTH ONCE DAILY 30 tablet 6  . Omega-3 Fatty Acids (FISH OIL) 1000 MG CAPS Take 2 capsules (2,000 mg total) by mouth 2 (two) times daily. 120 capsule 3  . potassium chloride SA (K-DUR,KLOR-CON) 20 MEQ tablet Take 1 tablet (20 mEq total) by mouth 2 (two) times daily. 60 tablet 6  . QNASL 80 MCG/ACT AERS Place 1 spray into the nose daily as needed (allergic rhinitis).     . sacubitril-valsartan (ENTRESTO) 24-26 MG Take 1 tablet by mouth 2 (two) times daily. 60 tablet 6  . spironolactone (ALDACTONE) 25 MG tablet Take 1 tablet (25 mg total) by mouth daily. HF FUND 10/27/2017 34 tablet 2  . traZODone (DESYREL) 50 MG tablet Take 50 mg by mouth at bedtime as needed for sleep.     No current facility-administered medications for this encounter.     Allergies:   Amoxicillin and Clarithromycin   Social History:  The patient  reports that she has never smoked. She has never used smokeless tobacco. She reports that she does not drink alcohol or use drugs.   Family History:  The patient's family history includes Brain cancer in her maternal uncle; Breast cancer in her maternal aunt; Diabetes in her mother; Hypertension in her mother; Hypothyroidism in her mother; Lung cancer in her maternal grandmother.   ROS:  Please see the history of present illness.   All other systems are personally reviewed and negative.   Exam:  Tele Health Call; Exam is subjective  Lungs: Normal respiratory effort with conversation.  Abdomen: Non-distended. Pt denies tenderness with self palpation.  Extremities: Pt denies edema. Neuro: Alert & oriented x 3.    Recent Labs: 09/20/2018: BUN 9; Creatinine, Ser 0.98; Potassium 3.6; Sodium 138  Personally reviewed   Wt Readings from Last 3 Encounters:  12/12/18 70.3 kg (155 lb)  08/01/18 72.8 kg (160 lb 9.6 oz)  06/20/18 73 kg (161 lb)      Other studies personally reviewed: Additional studies/ records that were reviewed today include:  ASSESSMENT AND PLAN:  1.  NICM Medtronic CRT-D. Recovered 07/2018 -->EF 60-65%.  NYHA II. Volume status sounds stable.  I will refill cardiac medications. Refill carvediloll, lasix, spironolactone. She is unable pay for medication. We will provide HF meds by the HF fund.   -We can refer her [facility] office.   COVID screen The patient does not have any symptoms that suggest any  further testing/ screening at this time.  Social distancing reinforced today.  Relevant cardiac medications were reviewed at length with the patient today.   The patient does not have concerns regarding their medications at this time.   The following changes were made today:  Refill sent for carvedilol, lasix, and spironolactone.   Patient Risk: After full review of this patients clinical status, I feel that they are at moderate risk for cardiac decompensation at this time.  Today, I have spent  32minutes with the patient with telehealth technology discussing heart failure.     Jeanmarie Hubert, NP  12/12/2018 2:05 PM  [facility] [address] [facility] [place] [address] [phone] (office) [phone] (fax)

## 2018-12-12 NOTE — Patient Instructions (Addendum)
  Today we will refill cardiac medications.   -Refill carvediloll, lasix, spironolactone. These were sent to [facility] to be filled through our [place] have been referred to [facility] Cardiology office for ongoing follow up. You will need a follow up in 6 months.

## 2019-01-10 MED FILL — FUROSEMIDE 20 MG TABS: 20 | 30 days supply | Qty: 30 | Fill #1

## 2019-01-10 MED FILL — CARVEDILOL 25 MG TABLET: 25 | 30 days supply | Qty: 60 | Fill #1

## 2019-01-10 MED FILL — SPIRONOLACTONE 25 MG TABS: 25 | 30 days supply | Qty: 30 | Fill #1

## 2019-02-15 MED FILL — CARVEDILOL 25 MG TABLET: 25 | 30 days supply | Qty: 60 | Fill #0

## 2019-02-15 MED FILL — FUROSEMIDE 20 MG TABS: 20 | 30 days supply | Qty: 30 | Fill #0

## 2019-02-15 MED FILL — SPIRONOLACTONE 25 MG TABLET: 25 | 30 days supply | Qty: 30 | Fill #0

## 2019-03-02 ENCOUNTER — Ambulatory Visit (INDEPENDENT_AMBULATORY_CARE_PROVIDER_SITE_OTHER): Payer: Self-pay | Admitting: *Deleted

## 2019-03-02 DIAGNOSIS — I428 Other cardiomyopathies: Secondary | ICD-10-CM

## 2019-03-03 LAB — CUP PACEART REMOTE DEVICE CHECK
Battery Remaining Longevity: 19 mo
Battery Voltage: 2.91 V
Brady Statistic AP VP Percent: 0.05 %
Brady Statistic AP VS Percent: 0 %
Brady Statistic AS VP Percent: 99.92 %
Brady Statistic AS VS Percent: 0.03 %
Brady Statistic RA Percent Paced: 0.05 %
Brady Statistic RV Percent Paced: 99.97 %
Date Time Interrogation Session: 20200625193527
HighPow Impedance: 72 Ohm
Implantable Lead Implant Date: 20170206
Implantable Lead Implant Date: 20170206
Implantable Lead Implant Date: 20170206
Implantable Lead Location: 753858
Implantable Lead Location: 753859
Implantable Lead Location: 753860
Implantable Lead Model: 4398
Implantable Lead Model: 5076
Implantable Pulse Generator Implant Date: 20170206
Lead Channel Impedance Value: 1045 Ohm
Lead Channel Impedance Value: 1102 Ohm
Lead Channel Impedance Value: 1121 Ohm
Lead Channel Impedance Value: 1159 Ohm
Lead Channel Impedance Value: 1368 Ohm
Lead Channel Impedance Value: 1387 Ohm
Lead Channel Impedance Value: 456 Ohm
Lead Channel Impedance Value: 494 Ohm
Lead Channel Impedance Value: 551 Ohm
Lead Channel Impedance Value: 646 Ohm
Lead Channel Impedance Value: 760 Ohm
Lead Channel Impedance Value: 779 Ohm
Lead Channel Impedance Value: 779 Ohm
Lead Channel Pacing Threshold Amplitude: 0.375 V
Lead Channel Pacing Threshold Amplitude: 0.625 V
Lead Channel Pacing Threshold Amplitude: 4.75 V
Lead Channel Pacing Threshold Pulse Width: 0.4 ms
Lead Channel Pacing Threshold Pulse Width: 0.4 ms
Lead Channel Pacing Threshold Pulse Width: 1 ms
Lead Channel Sensing Intrinsic Amplitude: 2.375 mV
Lead Channel Sensing Intrinsic Amplitude: 2.375 mV
Lead Channel Sensing Intrinsic Amplitude: 9.125 mV
Lead Channel Sensing Intrinsic Amplitude: 9.125 mV
Lead Channel Setting Pacing Amplitude: 1.5 V
Lead Channel Setting Pacing Amplitude: 2 V
Lead Channel Setting Pacing Amplitude: 3 V
Lead Channel Setting Pacing Pulse Width: 0.4 ms
Lead Channel Setting Pacing Pulse Width: 1 ms
Lead Channel Setting Sensing Sensitivity: 0.3 mV

## 2019-03-12 ENCOUNTER — Encounter: Payer: Self-pay | Admitting: Cardiology

## 2019-03-12 NOTE — Progress Notes (Signed)
Remote ICD transmission.   

## 2019-03-28 MED FILL — FUROSEMIDE 20 MG TABS: 20 | 30 days supply | Qty: 30 | Fill #1

## 2019-03-28 MED FILL — SPIRONOLACTONE 25 MG TABLET: 25 | 30 days supply | Qty: 30 | Fill #1

## 2019-03-28 MED FILL — CARVEDILOL 25 MG TABLET: 25 | 30 days supply | Qty: 60 | Fill #1

## 2019-04-14 ENCOUNTER — Ambulatory Visit: Payer: Self-pay | Admitting: Cardiology

## 2019-04-14 NOTE — Progress Notes (Deleted)
Clinical Summary Ms. Weyandt is a 48 y.o.female previously followed in CHF clinic, this is our first visit together.    1. NICM -LVEF previously as low as 20% in 2016 - echo 07/2018 LVEF 60-65% - 2016 cath no significant CAD, RHC with preserved CO - had medtronic CRT-D placed in 2017    2.     3. Asthma Past Medical History:  Diagnosis Date  . 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 Anxiety   . Asthma   . Cardiac defibrillator in place   . Chronic systolic CHF (congestive heart failure) ([place])    a. 02/2015 Echo: EF 20%, sev dil w/ diff HK, worse @ inf base. Tiv AI, mild MR, mod dil LA, PASP 51mmHg.  . Functional dyspepsia    early satiety   . GERD with stricture    dilated 2010  . HELICOBACTER PYLORI GASTRITIS 01/15/2009   dyspnea, ? reaction to Biaxin/amoxicillin  Pylera 01/15/09 - completed therapy     . NICM (nonischemic cardiomyopathy) ([place])    a. 02/2015 Cath: nl cors, EF 15%, mild PAH;  b. 02/2015 Enrolled in VEST trial.  . Sickle cell trait ([place])   . Situational depression    "son died"     Allergies  Allergen Reactions  . Amoxicillin Other (See Comments)    REACTION: Tacycardia  . Clarithromycin Hives    Breaks out and gets whelps     Current Outpatient Medications  Medication Sig Dispense Refill  . albuterol (PROVENTIL HFA;VENTOLIN HFA) 108 (90 BASE) MCG/ACT inhaler Inhale into the lungs every 6 (six) hours as needed for wheezing or shortness of breath.    . busPIRone (BUSPAR) 15 MG tablet Take 15 mg by mouth 3 (three) times daily.    . carvedilol (COREG) 25 MG tablet Take 1 tablet (25 mg total) by mouth 2 (two) times daily. HF FUND 10/27/2017 180 tablet 3  . citalopram (CELEXA) 20 MG tablet Take 20 mg by mouth daily.    Marland Kitchen dexlansoprazole (DEXILANT) 60 MG capsule Take 60 mg by mouth daily.    . fexofenadine (ALLEGRA) 180 MG tablet Take 180 mg by mouth 2 (two) times daily.     . Fluticasone-Salmeterol (ADVAIR DISKUS) 250-50 MCG/DOSE AEPB  Inhale 1 puff into the lungs 2 (two) times daily. 180 each 4  . furosemide (LASIX) 20 MG tablet Take 1 tablet (20 mg total) by mouth daily. 90 tablet 3  . hydrOXYzine (ATARAX/VISTARIL) 25 MG tablet Take 25 mg by mouth 3 (three) times daily.    . magnesium oxide (MAG-OX) 400 (241.3 Mg) MG tablet TAKE ONE TABLET BY MOUTH ONCE DAILY 30 tablet 6  . Omega-3 Fatty Acids (FISH OIL) 1000 MG CAPS Take 2 capsules (2,000 mg total) by mouth 2 (two) times daily. 120 capsule 3  . potassium chloride SA (K-DUR,KLOR-CON) 20 MEQ tablet Take 1 tablet (20 mEq total) by mouth 2 (two) times daily. 60 tablet 6  . QNASL 80 MCG/ACT AERS Place 1 spray into the nose daily as needed (allergic rhinitis).     . sacubitril-valsartan (ENTRESTO) 24-26 MG Take 1 tablet by mouth 2 (two) times daily. 60 tablet 6  . spironolactone (ALDACTONE) 25 MG tablet Take 1 tablet (25 mg total) by mouth daily. HF FUND 10/27/2017 90 tablet 3  . traZODone (DESYREL) 50 MG tablet Take 50 mg by mouth at bedtime as needed for sleep.     No current facility-administered medications for this visit.  Past Surgical History:  Procedure Laterality Date  . CARDIAC CATHETERIZATION N/A 02/26/2015   Procedure: Right/Left Heart Cath and Coronary Angiography;  Surgeon: Troy Sine, MD;  Location: [facility];  Service: Cardiovascular;  Laterality: N/A;  . COLPOSCOPY  2013   neg  . EP IMPLANTABLE DEVICE N/A 10/14/2015   Procedure: BiV ICD Insertion CRT-D;  Surgeon: Deboraha Sprang, MD;  Location: [facility];  Service: Cardiovascular;  Laterality: N/A;  . ESOPHAGOGASTRODUODENOSCOPY (EGD) WITH ESOPHAGEAL DILATION  11/30/2008   esophageal ring dilated 54 French, H. pylori gastritis  . TUBAL LIGATION  1991  . WISDOM TOOTH EXTRACTION       Allergies  Allergen Reactions  . Amoxicillin Other (See Comments)    REACTION: Tacycardia  . Clarithromycin Hives    Breaks out and gets whelps      Family History  Problem Relation Age of Onset   . Diabetes Mother   . Hypertension Mother   . Hypothyroidism Mother   . Breast cancer Maternal Aunt   . Brain cancer Maternal Uncle   . Lung cancer Maternal Grandmother   . Colon cancer Neg Hx   . Colon polyps Neg Hx   . Esophageal cancer Neg Hx      Social History Ms. Demuro reports that she has never smoked. She has never used smokeless tobacco. Ms. Campion reports no history of alcohol use.   Review of Systems CONSTITUTIONAL: No weight loss, fever, chills, weakness or fatigue.  HEENT: Eyes: No visual loss, blurred vision, double vision or yellow sclerae.No hearing loss, sneezing, congestion, runny nose or sore throat.  SKIN: No rash or itching.  CARDIOVASCULAR:  RESPIRATORY: No shortness of breath, cough or sputum.  GASTROINTESTINAL: No anorexia, nausea, vomiting or diarrhea. No abdominal pain or blood.  GENITOURINARY: No burning on urination, no polyuria NEUROLOGICAL: No headache, dizziness, syncope, paralysis, ataxia, numbness or tingling in the extremities. No change in bowel or bladder control.  MUSCULOSKELETAL: No muscle, back pain, joint pain or stiffness.  LYMPHATICS: No enlarged nodes. No history of splenectomy.  PSYCHIATRIC: No history of depression or anxiety.  ENDOCRINOLOGIC: No reports of sweating, cold or heat intolerance. No polyuria or polydipsia.  Marland Kitchen   Physical Examination There were no vitals filed for this visit. There were no vitals filed for this visit.  Gen: resting comfortably, no acute distress HEENT: no scleral icterus, pupils equal round and reactive, no palptable cervical adenopathy,  CV Resp: Clear to auscultation bilaterally GI: abdomen is soft, non-tender, non-distended, normal bowel sounds, no hepatosplenomegaly MSK: extremities are warm, no edema.  Skin: warm, no rash Neuro:  no focal deficits Psych: appropriate affect   Diagnostic Studies 07/2018 echo Study Conclusions  - Left ventricle: Global longitudinal strain is -17.6% The  cavity   size was normal. Wall thickness was normal. Systolic function was   normal. The estimated ejection fraction was in the range of 60%   to 65%. Left ventricular diastolic function parameters were   normal. - Pulmonary arteries: PA peak pressure: 31 mm Hg (S). - Pericardium, extracardiac: A trivial pericardial effusion was   identified.  03/2015 cardiac MRI IMPRESSION: 1. Moderately dilated LV with EF 31%. Wall motion pattern as above.  2.  Prominent LV septal-lateral dyssynchrony.  3.  Normal RV size and systolic function.  4. No myocardial LGE, so no definitive evidence for prior MI, myocarditis, or infiltrative disease.   02/2015 RHC/LHC  There is severe left ventricular systolic dysfunction.  Very mild elevation  of right sided heart pressures  Central aortic oxygen saturation 98%; pulmonary artery oxygen saturation 65%.   Dilated severe nonischemic cardiomyopathy with an ejection fraction of 15%.  Normal coronary arteries.  Mild pulmonary hypertension.     Assessment and Plan  1. NICM/Chronic systolic HF       Arnoldo Lenis, M.D., F.A.C.C.

## 2019-04-28 ENCOUNTER — Encounter (HOSPITAL_COMMUNITY): Payer: Self-pay | Admitting: Emergency Medicine

## 2019-04-28 ENCOUNTER — Other Ambulatory Visit: Payer: Self-pay

## 2019-04-28 ENCOUNTER — Emergency Department (HOSPITAL_COMMUNITY): Payer: Self-pay

## 2019-04-28 ENCOUNTER — Emergency Department (HOSPITAL_COMMUNITY)
Admission: EM | Admit: 2019-04-28 | Discharge: 2019-04-29 | Disposition: A | Discharge status: Home / Self Care | Payer: Self-pay | Attending: Emergency Medicine | Admitting: Emergency Medicine

## 2019-04-28 DIAGNOSIS — Z8679 Personal history of other diseases of the circulatory system: Secondary | ICD-10-CM | POA: Insufficient documentation

## 2019-04-28 DIAGNOSIS — R079 Chest pain, unspecified: Secondary | ICD-10-CM | POA: Insufficient documentation

## 2019-04-28 DIAGNOSIS — Z79899 Other long term (current) drug therapy: Secondary | ICD-10-CM | POA: Insufficient documentation

## 2019-04-28 DIAGNOSIS — I110 Hypertensive heart disease with heart failure: Secondary | ICD-10-CM | POA: Insufficient documentation

## 2019-04-28 DIAGNOSIS — I5022 Chronic systolic (congestive) heart failure: Secondary | ICD-10-CM | POA: Insufficient documentation

## 2019-04-28 DIAGNOSIS — J45909 Unspecified asthma, uncomplicated: Secondary | ICD-10-CM | POA: Insufficient documentation

## 2019-04-28 LAB — CBC
HCT: 36.8 % (ref 36.0–46.0)
Hemoglobin: 12.4 g/dL (ref 12.0–15.0)
MCH: 28.8 pg (ref 26.0–34.0)
MCHC: 33.7 g/dL (ref 30.0–36.0)
MCV: 85.4 fL (ref 80.0–100.0)
Platelets: 249 10*3/uL (ref 150–400)
RBC: 4.31 MIL/uL (ref 3.87–5.11)
RDW: 13.2 % (ref 11.5–15.5)
WBC: 6.2 10*3/uL (ref 4.0–10.5)
nRBC: 0 % (ref 0.0–0.2)

## 2019-04-28 LAB — BASIC METABOLIC PANEL
Anion gap: 9 (ref 5–15)
BUN: 10 mg/dL (ref 6–20)
CO2: 23 mmol/L (ref 22–32)
Calcium: 8.6 mg/dL — ABNORMAL LOW (ref 8.9–10.3)
Chloride: 103 mmol/L (ref 98–111)
Creatinine, Ser: 0.97 mg/dL (ref 0.44–1.00)
GFR calc Af Amer: >60 mL/min (ref >60)
GFR calc non Af Amer: >60 mL/min (ref >60)
Glucose, Bld: 86 mg/dL (ref 70–99)
Potassium: 4 mmol/L (ref 3.5–5.1)
Sodium: 135 mmol/L (ref 135–145)

## 2019-04-28 LAB — BRAIN NATRIURETIC PEPTIDE: B Natriuretic Peptide: 11.1 pg/mL (ref 0.0–100.0)

## 2019-04-28 LAB — I-STAT BETA HCG BLOOD, ED (MC, WL, AP ONLY): I-stat hCG, quantitative: <5 m[IU]/mL (ref <5)

## 2019-04-28 LAB — TROPONIN I (HIGH SENSITIVITY)
Troponin I (High Sensitivity): 5 ng/L (ref <18)
Troponin I (High Sensitivity): 5 ng/L (ref <18)

## 2019-04-28 MED ORDER — SODIUM CHLORIDE 0.9% FLUSH: 3.0000 mL | Freq: Once | INTRAVENOUS | Status: DC

## 2019-04-28 NOTE — ED Triage Notes (Addendum)
Pt here for eval of several days of mid chest pain radiating to left arm that has been intermittent. Pain is currently 4/10. Pt also endorses shortness of breath and fluid retention to legs.

## 2019-04-29 NOTE — ED Provider Notes (Signed)
[facility] EMERGENCY DEPARTMENT Provider Note   CSN: [id number] Arrival date & time: 04/28/19  1805     History   Chief Complaint Chief Complaint  Patient presents with  . Chest Pain    HPI Mary Pratt is a 48 y.o. female.     Patient is a 48 year old female with past medical history of nonischemic cardiomyopathy with pacemaker defibrillator in 2016.  She presents today for evaluation of chest discomfort.  Patient states that for the past week, she has been having intermittent discomfort in the front of her chest.  This comes and goes and is not associated with exertion.  She feels as though she may have some swelling in her legs.  She denies any fevers or chills.  She denies any productive cough.  The history is provided by the patient.  Chest Pain Pain location:  Substernal area Pain quality: tightness   Pain radiates to:  Does not radiate Onset quality:  Gradual Duration:  1 week Timing:  Intermittent Progression:  Unchanged Chronicity:  New Relieved by:  Nothing Worsened by:  Nothing Ineffective treatments:  None tried   Past Medical History:  Diagnosis Date  . 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 Anxiety   . Asthma   . Cardiac defibrillator in place   . Chronic systolic CHF (congestive heart failure) ([place])    a. 02/2015 Echo: EF 20%, sev dil w/ diff HK, worse @ inf base. Tiv AI, mild MR, mod dil LA, PASP 50mmHg.  . Functional dyspepsia    early satiety   . GERD with stricture    dilated 2010  . HELICOBACTER PYLORI GASTRITIS 01/15/2009   dyspnea, ? reaction to Biaxin/amoxicillin  Pylera 01/15/09 - completed therapy     . NICM (nonischemic cardiomyopathy) ([place])    a. 02/2015 Cath: nl cors, EF 15%, mild PAH;  b. 02/2015 Enrolled in VEST trial.  . Sickle cell trait ([place])   . Situational depression    "son died"    Patient Active Problem List   Diagnosis Date Noted  . Fatigue 02/16/2017  . Chronic systolic heart failure ([place])  06/12/2015  . Nonischemic cardiomyopathy ([place]) 02/27/2015  . Essential hypertension 02/27/2015  . Cardiomyopathy ([place])   . Acute systolic heart failure ([place]) 02/22/2015  . Functional dyspepsia 01/23/2011  . Asthma 12/28/2007  . GERD with stricture 12/28/2007    Past Surgical History:  Procedure Laterality Date  . CARDIAC CATHETERIZATION N/A 02/26/2015   Procedure: Right/Left Heart Cath and Coronary Angiography;  Surgeon: Troy Sine, MD;  Location: [facility];  Service: Cardiovascular;  Laterality: N/A;  . COLPOSCOPY  2013   neg  . EP IMPLANTABLE DEVICE N/A 10/14/2015   Procedure: BiV ICD Insertion CRT-D;  Surgeon: Deboraha Sprang, MD;  Location: [facility];  Service: Cardiovascular;  Laterality: N/A;  . ESOPHAGOGASTRODUODENOSCOPY (EGD) WITH ESOPHAGEAL DILATION  11/30/2008   esophageal ring dilated 76 French, H. pylori gastritis  . TUBAL LIGATION  1991  . WISDOM TOOTH EXTRACTION       OB History    Gravida  3   Para  3   Term  3   Preterm      AB      Living  2     SAB      TAB      Ectopic      Multiple      Live Births  3  Home Medications    Prior to Admission medications   Medication Sig Start Date End Date Taking? Authorizing Provider  albuterol (PROVENTIL HFA;VENTOLIN HFA) 108 (90 BASE) MCG/ACT inhaler Inhale into the lungs every 6 (six) hours as needed for wheezing or shortness of breath.    [provider]  busPIRone (BUSPAR) 15 MG tablet Take 15 mg by mouth 3 (three) times daily.    [provider]  carvedilol (COREG) 25 MG tablet Take 1 tablet (25 mg total) by mouth 2 (two) times daily. HF FUND 10/27/2017 12/12/18   Bensimhon, Shaune Pascal, MD  citalopram (CELEXA) 20 MG tablet Take 20 mg by mouth daily.    [provider]  dexlansoprazole (DEXILANT) 60 MG capsule Take 60 mg by mouth daily.    [provider]  fexofenadine (ALLEGRA) 180 MG tablet Take 180 mg by mouth 2 (two) times daily.      [provider]  Fluticasone-Salmeterol (ADVAIR DISKUS) 250-50 MCG/DOSE AEPB Inhale 1 puff into the lungs 2 (two) times daily. 03/23/13   [name], Armando Reichert, MD  furosemide (LASIX) 20 MG tablet Take 1 tablet (20 mg total) by mouth daily. 12/12/18   Bensimhon, Shaune Pascal, MD  hydrOXYzine (ATARAX/VISTARIL) 25 MG tablet Take 25 mg by mouth 3 (three) times daily.    [provider]  magnesium oxide (MAG-OX) 400 (241.3 Mg) MG tablet TAKE ONE TABLET BY MOUTH ONCE DAILY 10/19/17   Larey Dresser, MD  Omega-3 Fatty Acids (FISH OIL) 1000 MG CAPS Take 2 capsules (2,000 mg total) by mouth 2 (two) times daily. 11/16/17   Larey Dresser, MD  potassium chloride SA (K-DUR,KLOR-CON) 20 MEQ tablet Take 1 tablet (20 mEq total) by mouth 2 (two) times daily. 05/18/16   Larey Dresser, MD  QNASL 80 MCG/ACT AERS Place 1 spray into the nose daily as needed (allergic rhinitis).  09/18/14   [provider]  sacubitril-valsartan (ENTRESTO) 24-26 MG Take 1 tablet by mouth 2 (two) times daily. 10/07/15   Larey Dresser, MD  spironolactone (ALDACTONE) 25 MG tablet Take 1 tablet (25 mg total) by mouth daily. HF FUND 10/27/2017 12/12/18   Bensimhon, Shaune Pascal, MD  traZODone (DESYREL) 50 MG tablet Take 50 mg by mouth at bedtime as needed for sleep.    [provider]    Family History Family History  Problem Relation Age of Onset  . Diabetes Mother   . Hypertension Mother   . Hypothyroidism Mother   . Breast cancer Maternal Aunt   . Brain cancer Maternal Uncle   . Lung cancer Maternal Grandmother   . Colon cancer Neg Hx   . Colon polyps Neg Hx   . Esophageal cancer Neg Hx     Social History Social History   Tobacco Use  . Smoking status: Never Smoker  . Smokeless tobacco: Never Used  Substance Use Topics  . Alcohol use: No  . Drug use: No     Allergies   Amoxicillin and Clarithromycin   Review of Systems Review of Systems  Cardiovascular: Positive for chest pain.  All other  systems reviewed and are negative.    Physical Exam Updated Vital Signs BP (!) 154/109   Pulse 61   Temp 98.4 F (36.9 C) (Oral)   Resp 18   LMP 04/27/2019 Comment: pt shielded  SpO2 98%   Physical Exam Vitals signs and nursing note reviewed.  Constitutional:      General: She is not in acute distress.  Appearance: She is well-developed. She is not diaphoretic.  HENT:     Head: Normocephalic and atraumatic.  Neck:     Musculoskeletal: Normal range of motion and neck supple.  Cardiovascular:     Rate and Rhythm: Normal rate and regular rhythm.     Heart sounds: No murmur. No friction rub. No gallop.   Pulmonary:     Effort: Pulmonary effort is normal. No respiratory distress.     Breath sounds: Normal breath sounds. No wheezing.  Abdominal:     General: Bowel sounds are normal. There is no distension.     Palpations: Abdomen is soft.     Tenderness: There is no abdominal tenderness.  Musculoskeletal: Normal range of motion.     Right lower leg: She exhibits no tenderness. No edema.     Left lower leg: She exhibits no tenderness. No edema.  Skin:    General: Skin is warm and dry.  Neurological:     Mental Status: She is alert and oriented to person, place, and time.      ED Treatments / Results  Labs (all labs ordered are listed, but only abnormal results are displayed) Labs Reviewed  BASIC METABOLIC PANEL - Abnormal; Notable for the following components:      Result Value   Calcium 8.6 (*)    All other components within normal limits  CBC  BRAIN NATRIURETIC PEPTIDE  I-STAT BETA HCG BLOOD, ED ([facility], [facility], [facility] ONLY)  TROPONIN I (HIGH SENSITIVITY)  TROPONIN I (HIGH SENSITIVITY)    EKG EKG Interpretation  Date/Time:  Friday April 28 2019 18:12:48 EDT Ventricular Rate:  70 PR Interval:  176 QRS Duration: 114 QT Interval:  454 QTC Calculation: 490 R Axis:   -74 Text Interpretation:  Atrial-sensed ventricular-paced rhythm Abnormal ECG Confirmed by Veryl Speak [phone]) on 04/29/2019 12:59:46 AM   Radiology Dg Chest 2 View  Result Date: 04/28/2019 CLINICAL DATA:  Chest pain. EXAM: CHEST - 2 VIEW COMPARISON:  Most recent radiograph 02/27/2017 FINDINGS: Multi lead left-sided pacemaker in place.The cardiomediastinal contours are normal. Upper normal heart size unchanged from prior. The lungs are clear. Pulmonary vasculature is normal. No consolidation, pleural effusion, or pneumothorax. No acute osseous abnormalities are seen. IMPRESSION: No acute chest findings. Electronically Signed   By: Keith Rake M.D.   On: 04/28/2019 19:57    Procedures Procedures (including critical care time)  Medications Ordered in ED Medications  sodium chloride flush (NS) 0.9 % injection 3 mL (has no administration in time range)     Initial Impression / Assessment and Plan / ED Course  I have reviewed the triage vital signs and the nursing notes.  Pertinent labs & imaging results that were available during my care of the patient were reviewed by me and considered in my medical decision making (see chart for details).  Patient presenting here with complaints of chest discomfort.  She has history of pacer defibrillator secondary to nonischemic cardiomyopathy.  Patient's work-up today is unremarkable.  Her troponin is negative and EKG is unchanged.  Her BNP is normal and chest x-ray shows no evidence for CHF or fluid retention.  Patient does report being under a lot of stress with work and believes that this may be contributing to her symptoms.  I have found nothing abnormal on her work-up that is of concern.  At this point, I feel as though patient is appropriate for discharge with close follow-up with her cardiologist.  I have advised her to follow-up with  Dr. [name] next week, and return to the ER in the meantime if symptoms worsen or change.  Final Clinical Impressions(s) / ED Diagnoses   Final diagnoses:  None    ED Discharge Orders    None        Veryl Speak, MD 04/29/19 0111

## 2019-04-29 NOTE — ED Notes (Signed)
Patient verbalizes understanding of discharge instructions. Opportunity for questioning and answers were provided. Armband removed by staff, pt discharged from ED ambulatory.   

## 2019-04-29 NOTE — Discharge Instructions (Addendum)
Continue your medications as before.  Follow-up with Dr. [name] later this week, and return to the ER in the meantime if symptoms worsen or change.

## 2019-06-01 ENCOUNTER — Ambulatory Visit (INDEPENDENT_AMBULATORY_CARE_PROVIDER_SITE_OTHER): Payer: Self-pay | Admitting: *Deleted

## 2019-06-01 DIAGNOSIS — I428 Other cardiomyopathies: Secondary | ICD-10-CM

## 2019-06-01 DIAGNOSIS — I5022 Chronic systolic (congestive) heart failure: Secondary | ICD-10-CM

## 2019-06-01 MED FILL — SPIRONOLACTONE 25 MG TABLET: 25 | 30 days supply | Qty: 30 | Fill #2

## 2019-06-01 MED FILL — CARVEDILOL 25 MG TABLET: 25 | 30 days supply | Qty: 60 | Fill #2

## 2019-06-01 MED FILL — FUROSEMIDE 20 MG TABS: 20 | 30 days supply | Qty: 30 | Fill #2

## 2019-06-02 LAB — CUP PACEART REMOTE DEVICE CHECK
Battery Remaining Longevity: 17 mo
Battery Voltage: 2.91 V
Brady Statistic AP VP Percent: 0.05 %
Brady Statistic AP VS Percent: 0 %
Brady Statistic AS VP Percent: 99.92 %
Brady Statistic AS VS Percent: 0.03 %
Brady Statistic RA Percent Paced: 0.05 %
Brady Statistic RV Percent Paced: 99.97 %
Date Time Interrogation Session: 20200924062726
HighPow Impedance: 75 Ohm
Implantable Lead Implant Date: 20170206
Implantable Lead Implant Date: 20170206
Implantable Lead Implant Date: 20170206
Implantable Lead Location: 753858
Implantable Lead Location: 753859
Implantable Lead Location: 753860
Implantable Lead Model: 4398
Implantable Lead Model: 5076
Implantable Pulse Generator Implant Date: 20170206
Lead Channel Impedance Value: 1045 Ohm
Lead Channel Impedance Value: 1064 Ohm
Lead Channel Impedance Value: 1159 Ohm
Lead Channel Impedance Value: 1178 Ohm
Lead Channel Impedance Value: 1387 Ohm
Lead Channel Impedance Value: 1444 Ohm
Lead Channel Impedance Value: 456 Ohm
Lead Channel Impedance Value: 494 Ohm
Lead Channel Impedance Value: 646 Ohm
Lead Channel Impedance Value: 703 Ohm
Lead Channel Impedance Value: 722 Ohm
Lead Channel Impedance Value: 779 Ohm
Lead Channel Impedance Value: 817 Ohm
Lead Channel Pacing Threshold Amplitude: 0.375 V
Lead Channel Pacing Threshold Amplitude: 0.625 V
Lead Channel Pacing Threshold Amplitude: 4.75 V
Lead Channel Pacing Threshold Pulse Width: 0.4 ms
Lead Channel Pacing Threshold Pulse Width: 0.4 ms
Lead Channel Pacing Threshold Pulse Width: 1 ms
Lead Channel Sensing Intrinsic Amplitude: 0.75 mV
Lead Channel Sensing Intrinsic Amplitude: 0.75 mV
Lead Channel Sensing Intrinsic Amplitude: 9.875 mV
Lead Channel Sensing Intrinsic Amplitude: 9.875 mV
Lead Channel Setting Pacing Amplitude: 1.5 V
Lead Channel Setting Pacing Amplitude: 2 V
Lead Channel Setting Pacing Amplitude: 3 V
Lead Channel Setting Pacing Pulse Width: 0.4 ms
Lead Channel Setting Pacing Pulse Width: 1 ms
Lead Channel Setting Sensing Sensitivity: 0.3 mV

## 2019-06-09 ENCOUNTER — Encounter: Payer: Self-pay | Admitting: Cardiology

## 2019-06-09 NOTE — Progress Notes (Signed)
Remote ICD transmission.   

## 2019-08-31 ENCOUNTER — Ambulatory Visit (INDEPENDENT_AMBULATORY_CARE_PROVIDER_SITE_OTHER): Payer: Self-pay | Admitting: *Deleted

## 2019-08-31 DIAGNOSIS — I5022 Chronic systolic (congestive) heart failure: Secondary | ICD-10-CM

## 2019-09-01 LAB — CUP PACEART REMOTE DEVICE CHECK
Battery Remaining Longevity: 15 mo
Battery Remaining Longevity: 15 mo
Battery Voltage: 2.89 V
Battery Voltage: 2.89 V
Brady Statistic AP VP Percent: 0.01 %
Brady Statistic AP VP Percent: 0.02 %
Brady Statistic AP VS Percent: 0 %
Brady Statistic AP VS Percent: 0.02 %
Brady Statistic AS VP Percent: 99.77 %
Brady Statistic AS VP Percent: 99.95 %
Brady Statistic AS VS Percent: 0.03 %
Brady Statistic AS VS Percent: 0.19 %
Brady Statistic RA Percent Paced: 0.01 %
Brady Statistic RA Percent Paced: 0.04 %
Brady Statistic RV Percent Paced: 99.76 %
Brady Statistic RV Percent Paced: 99.96 %
Date Time Interrogation Session: 20201223233759
Date Time Interrogation Session: 20201224033325
HighPow Impedance: 69 Ohm
HighPow Impedance: 91 Ohm
Implantable Lead Implant Date: 20170206
Implantable Lead Implant Date: 20170206
Implantable Lead Implant Date: 20170206
Implantable Lead Implant Date: 20170206
Implantable Lead Implant Date: 20170206
Implantable Lead Implant Date: 20170206
Implantable Lead Location: 753858
Implantable Lead Location: 753858
Implantable Lead Location: 753859
Implantable Lead Location: 753859
Implantable Lead Location: 753860
Implantable Lead Location: 753860
Implantable Lead Model: 4398
Implantable Lead Model: 4398
Implantable Lead Model: 5076
Implantable Lead Model: 5076
Implantable Pulse Generator Implant Date: 20170206
Implantable Pulse Generator Implant Date: 20170206
Lead Channel Impedance Value: 1007 Ohm
Lead Channel Impedance Value: 1064 Ohm
Lead Channel Impedance Value: 1102 Ohm
Lead Channel Impedance Value: 1121 Ohm
Lead Channel Impedance Value: 1121 Ohm
Lead Channel Impedance Value: 1121 Ohm
Lead Channel Impedance Value: 1159 Ohm
Lead Channel Impedance Value: 1178 Ohm
Lead Channel Impedance Value: 1254 Ohm
Lead Channel Impedance Value: 456 Ohm
Lead Channel Impedance Value: 456 Ohm
Lead Channel Impedance Value: 494 Ohm
Lead Channel Impedance Value: 494 Ohm
Lead Channel Impedance Value: 513 Ohm
Lead Channel Impedance Value: 570 Ohm
Lead Channel Impedance Value: 570 Ohm
Lead Channel Impedance Value: 608 Ohm
Lead Channel Impedance Value: 608 Ohm
Lead Channel Impedance Value: 646 Ohm
Lead Channel Impedance Value: 665 Ohm
Lead Channel Impedance Value: 703 Ohm
Lead Channel Impedance Value: 760 Ohm
Lead Channel Impedance Value: 779 Ohm
Lead Channel Impedance Value: 931 Ohm
Lead Channel Impedance Value: 950 Ohm
Lead Channel Impedance Value: 988 Ohm
Lead Channel Pacing Threshold Amplitude: 0.375 V
Lead Channel Pacing Threshold Amplitude: 0.5 V
Lead Channel Pacing Threshold Amplitude: 0.625 V
Lead Channel Pacing Threshold Amplitude: 0.625 V
Lead Channel Pacing Threshold Amplitude: 4.75 V
Lead Channel Pacing Threshold Amplitude: 4.75 V
Lead Channel Pacing Threshold Pulse Width: 0.4 ms
Lead Channel Pacing Threshold Pulse Width: 0.4 ms
Lead Channel Pacing Threshold Pulse Width: 0.4 ms
Lead Channel Pacing Threshold Pulse Width: 0.4 ms
Lead Channel Pacing Threshold Pulse Width: 1 ms
Lead Channel Pacing Threshold Pulse Width: 1 ms
Lead Channel Sensing Intrinsic Amplitude: 1.125 mV
Lead Channel Sensing Intrinsic Amplitude: 1.125 mV
Lead Channel Sensing Intrinsic Amplitude: 13.125 mV
Lead Channel Sensing Intrinsic Amplitude: 13.125 mV
Lead Channel Sensing Intrinsic Amplitude: 13.125 mV
Lead Channel Sensing Intrinsic Amplitude: 13.125 mV
Lead Channel Sensing Intrinsic Amplitude: 2 mV
Lead Channel Sensing Intrinsic Amplitude: 2 mV
Lead Channel Setting Pacing Amplitude: 1.5 V
Lead Channel Setting Pacing Amplitude: 1.5 V
Lead Channel Setting Pacing Amplitude: 2 V
Lead Channel Setting Pacing Amplitude: 2 V
Lead Channel Setting Pacing Amplitude: 3 V
Lead Channel Setting Pacing Amplitude: 3 V
Lead Channel Setting Pacing Pulse Width: 0.4 ms
Lead Channel Setting Pacing Pulse Width: 0.4 ms
Lead Channel Setting Pacing Pulse Width: 1 ms
Lead Channel Setting Pacing Pulse Width: 1 ms
Lead Channel Setting Sensing Sensitivity: 0.3 mV
Lead Channel Setting Sensing Sensitivity: 0.3 mV

## 2019-09-04 MED FILL — CARVEDILOL 25 MG TABLET: 25 | 30 days supply | Qty: 60 | Fill #3

## 2019-09-04 MED FILL — spIRONOLACTONE 25 MG TABS: 25 | 30 days supply | Qty: 30 | Fill #3

## 2019-09-04 MED FILL — FUROSEMIDE 20 MG TABS: 20 | 30 days supply | Qty: 30 | Fill #3

## 2019-10-31 MED FILL — FUROSEMIDE 20 MG TABS: 20 | 30 days supply | Qty: 30 | Fill #4

## 2019-10-31 MED FILL — SPIRONOLACTONE 25 MG TABS: 25 | 30 days supply | Qty: 30 | Fill #4

## 2019-10-31 MED FILL — CARVEDILOL 25 MG TABLET: 25 | 30 days supply | Qty: 60 | Fill #4

## 2019-11-01 ENCOUNTER — Telehealth (HOSPITAL_COMMUNITY): Payer: Self-pay

## 2019-11-01 NOTE — Telephone Encounter (Signed)
Received medical request from [employer]. Records faxed to [phone]

## 2019-11-16 ENCOUNTER — Telehealth: Payer: Self-pay

## 2019-11-16 NOTE — Telephone Encounter (Signed)
Pt calling to schedule an appt with Dr. Caryl Comes.

## 2019-11-30 ENCOUNTER — Ambulatory Visit (INDEPENDENT_AMBULATORY_CARE_PROVIDER_SITE_OTHER): Payer: Self-pay | Admitting: *Deleted

## 2019-11-30 DIAGNOSIS — I5022 Chronic systolic (congestive) heart failure: Secondary | ICD-10-CM

## 2019-11-30 LAB — CUP PACEART REMOTE DEVICE CHECK
Battery Remaining Longevity: 13 mo
Battery Voltage: 2.88 V
Brady Statistic AP VP Percent: 0.01 %
Brady Statistic AP VS Percent: 0 %
Brady Statistic AS VP Percent: 99.95 %
Brady Statistic AS VS Percent: 0.03 %
Brady Statistic RA Percent Paced: 0.01 %
Brady Statistic RV Percent Paced: 99.96 %
Date Time Interrogation Session: 20210325002203
HighPow Impedance: 71 Ohm
Implantable Lead Implant Date: 20170206
Implantable Lead Implant Date: 20170206
Implantable Lead Implant Date: 20170206
Implantable Lead Location: 753858
Implantable Lead Location: 753859
Implantable Lead Location: 753860
Implantable Lead Model: 4398
Implantable Lead Model: 5076
Implantable Pulse Generator Implant Date: 20170206
Lead Channel Impedance Value: 1007 Ohm
Lead Channel Impedance Value: 1102 Ohm
Lead Channel Impedance Value: 1178 Ohm
Lead Channel Impedance Value: 1254 Ohm
Lead Channel Impedance Value: 494 Ohm
Lead Channel Impedance Value: 494 Ohm
Lead Channel Impedance Value: 551 Ohm
Lead Channel Impedance Value: 646 Ohm
Lead Channel Impedance Value: 665 Ohm
Lead Channel Impedance Value: 703 Ohm
Lead Channel Impedance Value: 817 Ohm
Lead Channel Impedance Value: 988 Ohm
Lead Channel Impedance Value: 988 Ohm
Lead Channel Pacing Threshold Amplitude: 0.375 V
Lead Channel Pacing Threshold Amplitude: 0.625 V
Lead Channel Pacing Threshold Amplitude: 4.75 V
Lead Channel Pacing Threshold Pulse Width: 0.4 ms
Lead Channel Pacing Threshold Pulse Width: 0.4 ms
Lead Channel Pacing Threshold Pulse Width: 1 ms
Lead Channel Sensing Intrinsic Amplitude: 10.625 mV
Lead Channel Sensing Intrinsic Amplitude: 10.625 mV
Lead Channel Sensing Intrinsic Amplitude: 2.375 mV
Lead Channel Sensing Intrinsic Amplitude: 2.375 mV
Lead Channel Setting Pacing Amplitude: 1.5 V
Lead Channel Setting Pacing Amplitude: 2 V
Lead Channel Setting Pacing Amplitude: 3 V
Lead Channel Setting Pacing Pulse Width: 0.4 ms
Lead Channel Setting Pacing Pulse Width: 1 ms
Lead Channel Setting Sensing Sensitivity: 0.3 mV

## 2019-12-01 NOTE — Progress Notes (Signed)
ICD Remote  

## 2019-12-11 DIAGNOSIS — Z95810 Presence of automatic (implantable) cardiac defibrillator: Secondary | ICD-10-CM | POA: Insufficient documentation

## 2019-12-12 ENCOUNTER — Encounter: Payer: Self-pay | Admitting: Internal Medicine

## 2019-12-14 NOTE — Progress Notes (Deleted)
Cardiology Office Note Date:  12/14/2019  Patient ID:  Mary Pratt, Mary Pratt [birth year], MRN [id number] PCP:  Caryl Bis, MD  Cardiologist:  Dr. [name] Electrophysiologist; Dr. Caryl Comes   Chief Complaint: device visit  History of Present Illness: Mary Pratt is a 49 y.o. female with history of NICM, anxiety, asthma, sickle cell trait, chronic CHF (systolic), CRT-D.  123XX123 her echo showed EF 20% with severe LV dilation.  Coronary angiography showed normal coronaries.  RHC showed preserved cardiac output.  She was diuresed and started on cardiac meds. Repeat echo in 12/16 showed EF 30% with septal-lateral dyssynchrony.  She had Medtronic CRT-D system placed in 2/17.  Most recent echo in 02/2017 showed remained recovery of LV function, EF 55-60%.   She comes in today to be seen for Dr. Caryl Comes, last seen by him Nov 2019.  She was doing well, no SOB on glat ground, some with stairs. LV thresholds reduced to 1/2V above.  Her last visit with AHF team was April 2020 via tele-health, she was generally fatigued, though doing well.  Unable to afford her medicines, planned to cover cost of the HF drugs through the HF patient fund.  Referred to [facility] cardiology office   *** meds? *** community health *** ? Referred to [facility] cardiology office? *** volume *** labs *** symptoms *** + remotes *** AF?   Device information: MDT CRT-D, implanted 10/14/15, Dr. Caryl Comes  Past Medical History:  Diagnosis Date  . 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 Anxiety   . Asthma   . Cardiac defibrillator in place   . Chronic systolic CHF (congestive heart failure) ([place])    a. 02/2015 Echo: EF 20%, sev dil w/ diff HK, worse @ inf base. Tiv AI, mild MR, mod dil LA, PASP 8mmHg.  . Functional dyspepsia    early satiety   . GERD with stricture    dilated 2010  . HELICOBACTER PYLORI GASTRITIS 01/15/2009   dyspnea, ? reaction to Biaxin/amoxicillin  Pylera 01/15/09 - completed therapy     . NICM (nonischemic  cardiomyopathy) ([place])    a. 02/2015 Cath: nl cors, EF 15%, mild PAH;  b. 02/2015 Enrolled in VEST trial.  . Sickle cell trait ([place])   . Situational depression    "son died"    Past Surgical History:  Procedure Laterality Date  . CARDIAC CATHETERIZATION N/A 02/26/2015   Procedure: Right/Left Heart Cath and Coronary Angiography;  Surgeon: Troy Sine, MD;  Location: [place] CV [facility];  Service: Cardiovascular;  Laterality: N/A;  . COLPOSCOPY  2013   neg  . EP IMPLANTABLE DEVICE N/A 10/14/2015   Procedure: BiV ICD Insertion CRT-D;  Surgeon: Deboraha Sprang, MD;  Location: [place] [facility] LAB;  Service: Cardiovascular;  Laterality: N/A;  . ESOPHAGOGASTRODUODENOSCOPY (EGD) WITH ESOPHAGEAL DILATION  11/30/2008   esophageal ring dilated 46 French, H. pylori gastritis  . TUBAL LIGATION  1991  . WISDOM TOOTH EXTRACTION      Current Outpatient Medications  Medication Sig Dispense Refill  . albuterol (PROVENTIL HFA;VENTOLIN HFA) 108 (90 BASE) MCG/ACT inhaler Inhale into the lungs every 6 (six) hours as needed for wheezing or shortness of breath.    . busPIRone (BUSPAR) 15 MG tablet Take 15 mg by mouth 3 (three) times daily.    . carvedilol (COREG) 25 MG tablet Take 1 tablet (25 mg total) by mouth 2 (two) times daily. HF FUND 10/27/2017 180 tablet 3  . citalopram (CELEXA)  20 MG tablet Take 20 mg by mouth daily.    Marland Kitchen dexlansoprazole (DEXILANT) 60 MG capsule Take 60 mg by mouth daily.    . fexofenadine (ALLEGRA) 180 MG tablet Take 180 mg by mouth 2 (two) times daily.     . Fluticasone-Salmeterol (ADVAIR DISKUS) 250-50 MCG/DOSE AEPB Inhale 1 puff into the lungs 2 (two) times daily. 180 each 4  . furosemide (LASIX) 20 MG tablet Take 1 tablet (20 mg total) by mouth daily. 90 tablet 3  . hydrOXYzine (ATARAX/VISTARIL) 25 MG tablet Take 25 mg by mouth 3 (three) times daily.    . magnesium oxide (MAG-OX) 400 (241.3 Mg) MG tablet TAKE ONE TABLET BY MOUTH ONCE DAILY 30 tablet 6  . Omega-3 Fatty Acids (FISH OIL)  1000 MG CAPS Take 2 capsules (2,000 mg total) by mouth 2 (two) times daily. 120 capsule 3  . potassium chloride SA (K-DUR,KLOR-CON) 20 MEQ tablet Take 1 tablet (20 mEq total) by mouth 2 (two) times daily. 60 tablet 6  . QNASL 80 MCG/ACT AERS Place 1 spray into the nose daily as needed (allergic rhinitis).     . sacubitril-valsartan (ENTRESTO) 24-26 MG Take 1 tablet by mouth 2 (two) times daily. 60 tablet 6  . spironolactone (ALDACTONE) 25 MG tablet Take 1 tablet (25 mg total) by mouth daily. HF FUND 10/27/2017 90 tablet 3  . traZODone (DESYREL) 50 MG tablet Take 50 mg by mouth at bedtime as needed for sleep.     No current facility-administered medications for this visit.    Allergies:   Amoxicillin and Clarithromycin   Social History:  The patient  reports that she has never smoked. She has never used smokeless tobacco. She reports that she does not drink alcohol or use drugs.   Family History:  The patient's family history includes Brain cancer in her maternal uncle; Breast cancer in her maternal aunt; Diabetes in her mother; Hypertension in her mother; Hypothyroidism in her mother; Lung cancer in her maternal grandmother.  ROS:  Please see the history of present illness.    All other systems are reviewed and otherwise negative.   PHYSICAL EXAM:  VS:  There were no vitals taken for this visit. BMI: There is no height or weight on file to calculate BMI. Well nourished, well developed, in no acute distress  HEENT: normocephalic, atraumatic  Neck: no JVD, carotid bruits or masses Cardiac: *** RRR; no significant murmurs, no rubs, or gallops Lungs: *** CTA b/l, no wheezing, rhonchi or rales  Abd: soft, nontender MS: no deformity or *** atrophy Ext: *** no edema is appreciated Skin: warm and dry, no rash Neuro:  No gross deficits appreciated Psych: euthymic mood, full affect  *** ICD site is stable, no tethering or discomfort   EKG: not done today  ICD interrogation done today and  reviewed by myself: ***  08/01/2018: TTE Study Conclusions - Left ventricle: Global longitudinal strain is -17.6% The cavity  size was normal. Wall thickness was normal. Systolic function was  normal. The estimated ejection fraction was in the range of 60%  to 65%. Left ventricular diastolic function parameters were  normal.  - Pulmonary arteries: PA peak pressure: 31 mm Hg (S).  - Pericardium, extracardiac: A trivial pericardial effusion was  identified.   05/20/2015: CPX Conclusion: Exercise testing with gas-exchange demonstrates a  moderate functional impairment when compared to matched sedentary  norms. The patient appears to be circulatory limited primarily  due to HF with blunted HR, BP and O2  response to the exercise.       02/26/2015: R/LHC  There is severe left ventricular systolic dysfunction.  Very mild elevation of right sided heart pressures  Central aortic oxygen saturation 98%; pulmonary artery oxygen saturation 65%.   Dilated severe nonischemic cardiomyopathy with an ejection fraction of 15%. Normal coronary arteries. Mild pulmonary hypertension.  RECOMMENDATION: Aggressive medical therapy for her nonischemic cardiomyopathy with medication titration over the next several months.  Consider interim LifeVest, pending follow-up LV function assessment in several months.    Recent Labs: 04/28/2019: B Natriuretic Peptide 11.1; BUN 10; Creatinine, Ser 0.97; Hemoglobin 12.4; Platelets 249; Potassium 4.0; Sodium 135  No results found for requested labs within last 8760 hours.   CrCl cannot be calculated (Patient's most recent lab result is older than the maximum 21 days allowed.).   Wt Readings from Last 3 Encounters:  12/12/18 155 lb (70.3 kg)  08/01/18 160 lb 9.6 oz (72.8 kg)  06/20/18 161 lb (73 kg)     Other studies reviewed: Additional studies/records reviewed today include: summarized above  ASSESSMENT AND PLAN:  1, CRT-D     ***  Intact  function, no changes made   2. Chronic CHF 3. NICM     Interval normalization of her LVEF     *** She follows closely with HF team  4. One AF episode, 33 minutes, 2018     CHA2DS2Vasc is 2 with hx of CM     Given normalization of her EF, and short duration, planned to monitor her AF burden     *** burden       Disposition: F/u with Dr. [name] as scheduled in November, Q 3 month remote device checks and in-clinic EP visit in 1 year, sooner if needed.  Current medicines are reviewed at length with the patient today.  The patient did not have any concerns regarding medicines  Signed, [name], PA-C 12/14/2019 1:06 PM     [facility] [address] [place] [address] [phone] (office)  [phone] (fax)

## 2019-12-15 ENCOUNTER — Other Ambulatory Visit: Payer: Self-pay

## 2019-12-15 ENCOUNTER — Encounter: Payer: Self-pay | Admitting: Physician Assistant

## 2019-12-15 ENCOUNTER — Ambulatory Visit (INDEPENDENT_AMBULATORY_CARE_PROVIDER_SITE_OTHER): Payer: BC Managed Care – PPO | Admitting: Physician Assistant

## 2019-12-15 VITALS — BP 118/82 | HR 74 | Ht 61.0 in | Wt 164.0 lb

## 2019-12-15 DIAGNOSIS — I5022 Chronic systolic (congestive) heart failure: Secondary | ICD-10-CM | POA: Diagnosis not present

## 2019-12-15 DIAGNOSIS — I429 Cardiomyopathy, unspecified: Secondary | ICD-10-CM | POA: Diagnosis not present

## 2019-12-15 DIAGNOSIS — Z95810 Presence of automatic (implantable) cardiac defibrillator: Secondary | ICD-10-CM

## 2019-12-15 DIAGNOSIS — Z79899 Other long term (current) drug therapy: Secondary | ICD-10-CM | POA: Diagnosis not present

## 2019-12-15 DIAGNOSIS — I428 Other cardiomyopathies: Secondary | ICD-10-CM | POA: Diagnosis not present

## 2019-12-15 NOTE — Patient Instructions (Signed)
Medication Instructions:   Your physician recommends that you continue on your current medications as directed. Please refer to the Current Medication list given to you today.  *If you need a refill on your cardiac medications before your next appointment, please call your pharmacy*   Lab Work: [facility] AND [place]    If you have labs (blood work) drawn today and your tests are completely normal, you will receive your results only by: Marland Kitchen MyChart Message (if you have MyChart) OR . A paper copy in the mail If you have any lab test that is abnormal or we need to change your treatment, we will call you to review the results.   Testing/Procedures: NONE ORDERED  TODAY   Follow-Up: At [facility], you and your health needs are our priority.  As part of our continuing mission to provide you with exceptional heart care, we have created designated Provider Care Teams.  These Care Teams include your primary Cardiologist (physician) and Advanced Practice Providers (APPs -  Physician Assistants and Nurse Practitioners) who all work together to provide you with the care you need, when you need it.  We recommend signing up for the patient portal called "MyChart".  Sign up information is provided on this After Visit Summary.  MyChart is used to connect with patients for Virtual Visits (Telemedicine).  Patients are able to view lab/test results, encounter notes, upcoming appointments, etc.  Non-urgent messages can be sent to your provider as well.   To learn more about what you can do with MyChart, go to [email or web address].    Your next appointment:   1 year(s)  The format for your next appointment:   In Person  Provider:   You may see Dr. Caryl Comes  or one of the following Advanced Practice Providers on your designated Care Team:    Chanetta Marshall, NP  [name], PA-C  Legrand Como "Oda [place], [place]    Other Instructions

## 2019-12-15 NOTE — Progress Notes (Signed)
Cardiology Office Note Date:  12/15/2019  Patient ID:  Mary Pratt [birth year], MRN [id number] PCP:  Caryl Bis, MD  Cardiologist:  Dr. [name] Electrophysiologist; Dr. Caryl Comes   Chief Complaint:   Annual device visit  History of Present Illness: Mary Pratt is a 49 y.o. female with history of NICM, anxiety, asthma, sickle cell trait, chronic CHF (systolic), CRT-D.  123XX123 her echo showed EF 20% with severe LV dilation.  Coronary angiography showed normal coronaries.  RHC showed preserved cardiac output.  She was diuresed and started on cardiac meds. Repeat echo in 12/16 showed EF 30% with septal-lateral dyssynchrony.  She had Medtronic CRT-D system placed in 2/17.  Most recent echo in 02/2017 showed remained recovery of LV function, EF 55-60%.   She comes in today to be seen for Dr. Caryl Comes, last seen by him Nov 2019.  She was doing well, no SOB on flat ground, some with stairs. LV thresholds reduced to 1/2V above.  Her last visit with AHF team was April 2020 via tele-health, she was generally fatigued, though doing well.  Unable to afford her medicines, planned to cover cost of the HF drugs through the HF patient fund.  Referred to [facility] cardiology office    She had an ER visit Aug 2020 for CP, labs looked OK, HS Trop were 5 and 5, CXR was clear.  Discharged from the ER. Mentioning she had been under increased personal stress and perhaps was etiology of her symptom.  She is doing well.  Has not had any recurrent CP. No palpitations or cardiac awareness, no dizzy spells, near syncope or syncope Intermittently she feels like she gets some swelling, bloating and will take an extra lsix, though also mentions likely food related . No rest SOB, no DOE with casual activities, she does well on flat ground, minimal DOE with stairs.  She reports she had RX coverage and is getting her medicines ok.  Device information: MDT CRT-D, implanted 10/14/15, Dr. Caryl Comes  Past Medical History:   Diagnosis Date  . 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 Anxiety   . Asthma   . Cardiac defibrillator in place   . Chronic systolic CHF (congestive heart failure) ([place])    a. 02/2015 Echo: EF 20%, sev dil w/ diff HK, worse @ inf base. Tiv AI, mild MR, mod dil LA, PASP 62mmHg.  . Functional dyspepsia    early satiety   . GERD with stricture    dilated 2010  . HELICOBACTER PYLORI GASTRITIS 01/15/2009   dyspnea, ? reaction to Biaxin/amoxicillin  Pylera 01/15/09 - completed therapy     . NICM (nonischemic cardiomyopathy) ([place])    a. 02/2015 Cath: nl cors, EF 15%, mild PAH;  b. 02/2015 Enrolled in VEST trial.  . Sickle cell trait ([place])   . Situational depression    "son died"    Past Surgical History:  Procedure Laterality Date  . CARDIAC CATHETERIZATION N/A 02/26/2015   Procedure: Right/Left Heart Cath and Coronary Angiography;  Surgeon: Troy Sine, MD;  Location: [place] CV LAB;  Service: Cardiovascular;  Laterality: N/A;  . COLPOSCOPY  2013   neg  . EP IMPLANTABLE DEVICE N/A 10/14/2015   Procedure: BiV ICD Insertion CRT-D;  Surgeon: Deboraha Sprang, MD;  Location: [place] CV LAB;  Service: Cardiovascular;  Laterality: N/A;  . ESOPHAGOGASTRODUODENOSCOPY (EGD) WITH ESOPHAGEAL DILATION  11/30/2008   esophageal ring dilated 68 French, H. pylori gastritis  .  TUBAL LIGATION  1991  . WISDOM TOOTH EXTRACTION      Current Outpatient Medications  Medication Sig Dispense Refill  . albuterol (PROVENTIL HFA;VENTOLIN HFA) 108 (90 BASE) MCG/ACT inhaler Inhale into the lungs every 6 (six) hours as needed for wheezing or shortness of breath.    . busPIRone (BUSPAR) 15 MG tablet Take 15 mg by mouth 3 (three) times daily.    . carvedilol (COREG) 25 MG tablet Take 1 tablet (25 mg total) by mouth 2 (two) times daily. HF FUND 10/27/2017 180 tablet 3  . citalopram (CELEXA) 20 MG tablet Take 20 mg by mouth daily.    Marland Kitchen dexlansoprazole (DEXILANT) 60 MG capsule Take 60 mg by mouth daily.    .  fexofenadine (ALLEGRA) 180 MG tablet Take 180 mg by mouth 2 (two) times daily.     . Fluticasone-Salmeterol (ADVAIR DISKUS) 250-50 MCG/DOSE AEPB Inhale 1 puff into the lungs 2 (two) times daily. 180 each 4  . furosemide (LASIX) 20 MG tablet Take 1 tablet (20 mg total) by mouth daily. 90 tablet 3  . hydrOXYzine (ATARAX/VISTARIL) 25 MG tablet Take 25 mg by mouth 3 (three) times daily.    . magnesium oxide (MAG-OX) 400 (241.3 Mg) MG tablet TAKE ONE TABLET BY MOUTH ONCE DAILY 30 tablet 6  . Omega-3 Fatty Acids (FISH OIL) 1000 MG CAPS Take 2 capsules (2,000 mg total) by mouth 2 (two) times daily. 120 capsule 3  . potassium chloride SA (K-DUR,KLOR-CON) 20 MEQ tablet Take 1 tablet (20 mEq total) by mouth 2 (two) times daily. 60 tablet 6  . QNASL 80 MCG/ACT AERS Place 1 spray into the nose daily as needed (allergic rhinitis).     . sacubitril-valsartan (ENTRESTO) 24-26 MG Take 1 tablet by mouth 2 (two) times daily. 60 tablet 6  . spironolactone (ALDACTONE) 25 MG tablet Take 1 tablet (25 mg total) by mouth daily. HF FUND 10/27/2017 90 tablet 3  . traZODone (DESYREL) 50 MG tablet Take 50 mg by mouth at bedtime as needed for sleep.     No current facility-administered medications for this visit.    Allergies:   Amoxicillin and Clarithromycin   Social History:  The patient  reports that she has never smoked. She has never used smokeless tobacco. She reports that she does not drink alcohol or use drugs.   Family History:  The patient's family history includes Brain cancer in her maternal uncle; Breast cancer in her maternal aunt; Diabetes in her mother; Hypertension in her mother; Hypothyroidism in her mother; Lung cancer in her maternal grandmother.  ROS:  Please see the history of present illness.    All other systems are reviewed and otherwise negative.   PHYSICAL EXAM:  VS:  Ht 5\' 1"  (1.549 m)   Wt 164 lb (74.4 kg)   BMI 30.99 kg/m  BMI: Body mass index is 30.99 kg/m. Well nourished, well  developed, in no acute distress  HEENT: normocephalic, atraumatic  Neck: no JVD, carotid bruits or masses Cardiac: RRR; no significant murmurs, no rubs, or gallops Lungs: CTA b/l, no wheezing, rhonchi or rales  Abd: soft, nontender MS: no deformity or atrophy Ext: no edema is appreciated Skin: warm and dry, no rash Neuro:  No gross deficits appreciated Psych: euthymic mood, full affect  ICD site is stable, no tethering or discomfort   EKG: not done today ER reviewed, 04/28/2019 SR, V paced  ICD interrogation done today and reviewed by myself:  battery and lead measurements are good LV  lead threshold up 1/4V, programmed to 3.25V/1,53ms to geep 1/2 volt margin No arrhythmias   08/01/2018: TTE Study Conclusions - Left ventricle: Global longitudinal strain is -17.6% The cavity  size was normal. Wall thickness was normal. Systolic function was  normal. The estimated ejection fraction was in the range of 60%  to 65%. Left ventricular diastolic function parameters were  normal.  - Pulmonary arteries: PA peak pressure: 31 mm Hg (S).  - Pericardium, extracardiac: A trivial pericardial effusion was  identified.   05/20/2015: CPX Conclusion: Exercise testing with gas-exchange demonstrates a  moderate functional impairment when compared to matched sedentary  norms. The patient appears to be circulatory limited primarily  due to HF with blunted HR, BP and O2 response to the exercise.       02/26/2015: R/LHC  There is severe left ventricular systolic dysfunction.  Very mild elevation of right sided heart pressures  Central aortic oxygen saturation 98%; pulmonary artery oxygen saturation 65%.   Dilated severe nonischemic cardiomyopathy with an ejection fraction of 15%. Normal coronary arteries. Mild pulmonary hypertension.  RECOMMENDATION: Aggressive medical therapy for her nonischemic cardiomyopathy with medication titration over the next several months.  Consider  interim LifeVest, pending follow-up LV function assessment in several months.    Recent Labs: 04/28/2019: B Natriuretic Peptide 11.1; BUN 10; Creatinine, Ser 0.97; Hemoglobin 12.4; Platelets 249; Potassium 4.0; Sodium 135  No results found for requested labs within last 8760 hours.   CrCl cannot be calculated (Patient's most recent lab result is older than the maximum 21 days allowed.).   Wt Readings from Last 3 Encounters:  12/15/19 164 lb (74.4 kg)  12/12/18 155 lb (70.3 kg)  08/01/18 160 lb 9.6 oz (72.8 kg)     Other studies reviewed: Additional studies/records reviewed today include: summarized above  ASSESSMENT AND PLAN:  1, CRT-D     Intact function, programming change as noted above     Estimated battery life 13 mo   2. Chronic CHF 3. NICM     Interval normalization of her LVEF     She follows with HF team, is due to see them   4. One AF episode, 33 minutes, 2018     CHA2DS2Vasc is 2 with hx of CM     Given normalization of her EF, and short duration, planned to monitor her AF burden      None noted       Disposition: update her labs, continue q91mo remotes until year of battery, then monthly.  See Dr. Belva Chimes App in 1 year, sooner if needed  Current medicines are reviewed at length with the patient today.  The patient did not have any concerns regarding medicines  Signed, [name], PA-C 12/15/2019 3:28 PM     [place] [address] [phone] (office)  [phone] (fax)

## 2019-12-16 LAB — BASIC METABOLIC PANEL
BUN/Creatinine Ratio: 9 (ref 9–23)
BUN: 8 mg/dL (ref 6–24)
CO2: 24 mmol/L (ref 20–29)
Calcium: 9.2 mg/dL (ref 8.7–10.2)
Chloride: 104 mmol/L (ref 96–106)
Creatinine, Ser: 0.89 mg/dL (ref 0.57–1.00)
GFR calc Af Amer: 87 mL/min/{1.73_m2} (ref >59)
GFR calc non Af Amer: 76 mL/min/{1.73_m2} (ref >59)
Glucose: 93 mg/dL (ref 65–99)
Potassium: 4 mmol/L (ref 3.5–5.2)
Sodium: 138 mmol/L (ref 134–144)

## 2019-12-16 LAB — MAGNESIUM: Magnesium: 2 mg/dL (ref 1.6–2.3)

## 2020-01-21 ENCOUNTER — Emergency Department (HOSPITAL_COMMUNITY)
Admission: EM | Admit: 2020-01-21 | Discharge: 2020-01-22 | Disposition: A | Discharge status: Home / Self Care | Payer: BC Managed Care – PPO | Attending: Emergency Medicine | Admitting: Emergency Medicine

## 2020-01-21 ENCOUNTER — Encounter (HOSPITAL_COMMUNITY): Payer: Self-pay | Admitting: Emergency Medicine

## 2020-01-21 ENCOUNTER — Other Ambulatory Visit: Payer: Self-pay

## 2020-01-21 DIAGNOSIS — R519 Headache, unspecified: Secondary | ICD-10-CM | POA: Insufficient documentation

## 2020-01-21 DIAGNOSIS — I5022 Chronic systolic (congestive) heart failure: Secondary | ICD-10-CM | POA: Insufficient documentation

## 2020-01-21 DIAGNOSIS — Z95810 Presence of automatic (implantable) cardiac defibrillator: Secondary | ICD-10-CM | POA: Diagnosis not present

## 2020-01-21 DIAGNOSIS — J45909 Unspecified asthma, uncomplicated: Secondary | ICD-10-CM | POA: Insufficient documentation

## 2020-01-21 DIAGNOSIS — I110 Hypertensive heart disease with heart failure: Secondary | ICD-10-CM | POA: Insufficient documentation

## 2020-01-21 DIAGNOSIS — Z79899 Other long term (current) drug therapy: Secondary | ICD-10-CM | POA: Insufficient documentation

## 2020-01-21 DIAGNOSIS — I10 Essential (primary) hypertension: Secondary | ICD-10-CM | POA: Diagnosis not present

## 2020-01-21 MED ORDER — KETOROLAC TROMETHAMINE 60 MG/2ML IM SOLN
30.0000 mg | Freq: Once | INTRAMUSCULAR | Status: AC
  Administered 2020-01-22: 30 mg via INTRAMUSCULAR
  Filled 2020-01-21: qty 2

## 2020-01-21 MED ORDER — BUTALBITAL-APAP-CAFFEINE 50-325-40 MG PO TABS
1.0000 | ORAL_TABLET | Freq: Once | ORAL | Status: AC
  Administered 2020-01-22: 1 via ORAL
  Filled 2020-01-21: qty 1

## 2020-01-21 NOTE — ED Triage Notes (Signed)
"  my blood pressure is high  I have heart failure"   "I don't have high blood pressure"  Here for concern of HTN  "my body been hot"

## 2020-01-22 MED ORDER — BUTALBITAL-APAP-CAFFEINE 50-325-40 MG PO TABS: 1.0000 | ORAL_TABLET | Freq: Four times a day (QID) | ORAL | 0 refills | Status: AC | PRN

## 2020-01-22 NOTE — ED Provider Notes (Signed)
[facility] EMERGENCY DEPARTMENT Provider Note   CSN: [id number] Arrival date & time: 01/21/20  2228     History Chief Complaint  Patient presents with  . Hypertension    Mary Pratt is a 49 y.o. female.  The history is provided by the patient and medical records.  Hypertension This is a new problem. The current episode started 2 days ago. The problem occurs constantly. The problem has not changed since onset.Associated symptoms include headaches. Nothing aggravates the symptoms. Nothing relieves the symptoms. She has tried acetaminophen for the symptoms. The treatment provided no relief.       Past Medical History:  Diagnosis Date  . 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 Anxiety   . Asthma   . Cardiac defibrillator in place   . Chronic systolic CHF (congestive heart failure) ([place])    a. 02/2015 Echo: EF 20%, sev dil w/ diff HK, worse @ inf base. Tiv AI, mild MR, mod dil LA, PASP 63mmHg.  . Functional dyspepsia    early satiety   . GERD with stricture    dilated 2010  . HELICOBACTER PYLORI GASTRITIS 01/15/2009   dyspnea, ? reaction to Biaxin/amoxicillin  Pylera 01/15/09 - completed therapy     . NICM (nonischemic cardiomyopathy) ([place])    a. 02/2015 Cath: nl cors, EF 15%, mild PAH;  b. 02/2015 Enrolled in VEST trial.  . Sickle cell trait ([place])   . Situational depression    "son died"    Patient Active Problem List   Diagnosis Date Noted  . 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 Fatigue 02/16/2017  . Chronic systolic heart failure ([place]) 06/12/2015  . Nonischemic cardiomyopathy ([place]) 02/27/2015  . Essential hypertension 02/27/2015  . Cardiomyopathy ([place])   . Acute systolic heart failure ([place]) 02/22/2015  . Functional dyspepsia 01/23/2011  . Asthma 12/28/2007  . GERD with stricture 12/28/2007    Past Surgical History:  Procedure Laterality Date  . CARDIAC CATHETERIZATION N/A 02/26/2015   Procedure: Right/Left  Heart Cath and Coronary Angiography;  Surgeon: Troy Sine, MD;  Location: [facility];  Service: Cardiovascular;  Laterality: N/A;  . COLPOSCOPY  2013   neg  . EP IMPLANTABLE DEVICE N/A 10/14/2015   Procedure: BiV ICD Insertion CRT-D;  Surgeon: Deboraha Sprang, MD;  Location: [facility];  Service: Cardiovascular;  Laterality: N/A;  . ESOPHAGOGASTRODUODENOSCOPY (EGD) WITH ESOPHAGEAL DILATION  11/30/2008   esophageal ring dilated 59 French, H. pylori gastritis  . TUBAL LIGATION  1991  . WISDOM TOOTH EXTRACTION       OB History    Gravida  3   Para  3   Term  3   Preterm      AB      Living  2     SAB      TAB      Ectopic      Multiple      Live Births  3           Family History  Problem Relation Age of Onset  . Diabetes Mother   . Hypertension Mother   . Hypothyroidism Mother   . Breast cancer Maternal Aunt   . Brain cancer Maternal Uncle   . Lung cancer Maternal Grandmother   . Colon cancer Neg Hx   . Colon polyps Neg Hx   . Esophageal cancer Neg Hx     Social History   Tobacco  Use  . Smoking status: Never Smoker  . Smokeless tobacco: Never Used  Substance Use Topics  . Alcohol use: No  . Drug use: No    Home Medications Prior to Admission medications   Medication Sig Start Date End Date Taking? Authorizing Provider  albuterol (PROVENTIL HFA;VENTOLIN HFA) 108 (90 BASE) MCG/ACT inhaler Inhale into the lungs every 6 (six) hours as needed for wheezing or shortness of breath.    [provider]  busPIRone (BUSPAR) 15 MG tablet Take 15 mg by mouth 3 (three) times daily.    [provider]  butalbital-acetaminophen-caffeine (FIORICET) 50-325-40 MG tablet Take 1-2 tablets by mouth every 6 (six) hours as needed for headache. 01/22/20 01/21/21  Frans Valente, Corene Cornea, MD  carvedilol (COREG) 25 MG tablet Take 1 tablet (25 mg total) by mouth 2 (two) times daily. HF FUND 10/27/2017 12/12/18   Bensimhon, Shaune Pascal, MD  citalopram (CELEXA) 20  MG tablet Take 20 mg by mouth daily.    [provider]  dexlansoprazole (DEXILANT) 60 MG capsule Take 60 mg by mouth daily.    [provider]  fexofenadine (ALLEGRA) 180 MG tablet Take 180 mg by mouth 2 (two) times daily.     [provider]  Fluticasone-Salmeterol (ADVAIR DISKUS) 250-50 MCG/DOSE AEPB Inhale 1 puff into the lungs 2 (two) times daily. 03/23/13   [name], Armando Reichert, MD  furosemide (LASIX) 20 MG tablet Take 1 tablet (20 mg total) by mouth daily. 12/12/18   Bensimhon, Shaune Pascal, MD  hydrOXYzine (ATARAX/VISTARIL) 25 MG tablet Take 25 mg by mouth 3 (three) times daily.    [provider]  magnesium oxide (MAG-OX) 400 (241.3 Mg) MG tablet TAKE ONE TABLET BY MOUTH ONCE DAILY 10/19/17   Larey Dresser, MD  Omega-3 Fatty Acids (FISH OIL) 1000 MG CAPS Take 2 capsules (2,000 mg total) by mouth 2 (two) times daily. 11/16/17   Larey Dresser, MD  potassium chloride SA (K-DUR,KLOR-CON) 20 MEQ tablet Take 1 tablet (20 mEq total) by mouth 2 (two) times daily. 05/18/16   Larey Dresser, MD  QNASL 80 MCG/ACT AERS Place 1 spray into the nose daily as needed (allergic rhinitis).  09/18/14   [provider]  sacubitril-valsartan (ENTRESTO) 24-26 MG Take 1 tablet by mouth 2 (two) times daily. 10/07/15   Larey Dresser, MD  spironolactone (ALDACTONE) 25 MG tablet Take 1 tablet (25 mg total) by mouth daily. HF FUND 10/27/2017 12/12/18   Bensimhon, Shaune Pascal, MD    Allergies    Amoxicillin and Clarithromycin  Review of Systems   Review of Systems  Neurological: Positive for headaches.  All other systems reviewed and are negative.   Physical Exam Updated Vital Signs BP (!) 144/87 (BP Location: Right Arm)   Pulse 76   Temp 98 F (36.7 C) (Oral)   Resp 18   Ht 5\' 1"  (1.549 m)   Wt 73.6 kg   LMP 01/15/2020   SpO2 100%   BMI 30.65 kg/m   Physical Exam Vitals and nursing note reviewed.  Constitutional:      Appearance: She is well-developed.  HENT:      Head: Normocephalic and atraumatic.     Mouth/Throat:     Mouth: Mucous membranes are dry.     Pharynx: Oropharynx is clear.  Eyes:     Pupils: Pupils are equal, round, and reactive to light.  Cardiovascular:     Rate and Rhythm: Normal rate and regular rhythm.  Pulmonary:  Effort: No respiratory distress.     Breath sounds: No stridor.  Abdominal:     General: There is no distension.  Musculoskeletal:     Cervical back: Normal range of motion.  Skin:    General: Skin is warm and dry.  Neurological:     General: No focal deficit present.     Mental Status: She is alert.     ED Results / Procedures / Treatments   Labs (all labs ordered are listed, but only abnormal results are displayed) Labs Reviewed - No data to display  EKG None  Radiology No results found.  Procedures Procedures (including critical care time)  Medications Ordered in ED Medications  butalbital-acetaminophen-caffeine (FIORICET) 50-325-40 MG per tablet 1 tablet (1 tablet Oral Given 01/22/20 0000)  ketorolac (TORADOL) injection 30 mg (30 mg Intramuscular Given 01/22/20 0000)    ED Course  I have reviewed the triage vital signs and the nursing notes.  Pertinent labs & imaging results that were available during my care of the patient were reviewed by me and considered in my medical decision making (see chart for details).    MDM Rules/Calculators/A&P                      Patient has mildly elevated blood pressure and a headache.  It is hard to tell the headache came first causing blood pressure vice versa.  Evaluated on think she has a hypertensive emergency.  She does have history of CHF no evidence of acute fluid overload.  She still urinating fine doubt she has any kidney damage.  This time feel she is stable to follow-up with a cardiologist and primary doctor for further blood pressure control.  Prescription for headache medications given. Final Clinical Impression(s) / ED Diagnoses Final  diagnoses:  Hypertension, unspecified type  Acute nonintractable headache, unspecified headache type    Rx / DC Orders ED Discharge Orders         Ordered    butalbital-acetaminophen-caffeine (FIORICET) 50-325-40 MG tablet  Every 6 hours PRN     01/22/20 0033           Markees Carns, Corene Cornea, MD 01/22/20 0140

## 2020-02-16 ENCOUNTER — Other Ambulatory Visit (HOSPITAL_COMMUNITY): Payer: Self-pay | Admitting: Internal Medicine

## 2020-02-16 DIAGNOSIS — I5022 Chronic systolic (congestive) heart failure: Secondary | ICD-10-CM

## 2020-02-16 NOTE — Telephone Encounter (Signed)
This is a CHF pt. Pt was referred to [facility] office. This needs to go to the [place] office. Please address

## 2020-02-22 ENCOUNTER — Other Ambulatory Visit (HOSPITAL_COMMUNITY): Payer: Self-pay | Admitting: *Deleted

## 2020-02-22 NOTE — Telephone Encounter (Signed)
Pt left VM requesting refills. Pt is no longer followed in our clinic she is followed by [facility]. Pt has insurance meds can no longer be filled under hf fund.

## 2020-02-23 ENCOUNTER — Telehealth (HOSPITAL_COMMUNITY): Payer: Self-pay | Admitting: *Deleted

## 2020-02-23 DIAGNOSIS — I5022 Chronic systolic (congestive) heart failure: Secondary | ICD-10-CM

## 2020-02-23 MED ORDER — CARVEDILOL 25 MG PO TABS: 25.0000 mg | ORAL_TABLET | Freq: Two times a day (BID) | ORAL | 0 refills | Status: DC

## 2020-02-23 MED ORDER — SPIRONOLACTONE 25 MG PO TABS: 25.0000 mg | ORAL_TABLET | Freq: Every day | ORAL | 0 refills | Status: DC

## 2020-02-23 MED ORDER — FUROSEMIDE 20 MG PO TABS: 20.0000 mg | ORAL_TABLET | Freq: Every day | ORAL | 0 refills | Status: DC

## 2020-02-23 MED FILL — CARVEDILOL 25 MG TABLET: 25 | 30 days supply | Qty: 60 | Fill #0

## 2020-02-23 MED FILL — SPIRONOLACTONE 25 MG TABS: 25 | 30 days supply | Qty: 30 | Fill #0

## 2020-02-23 MED FILL — FUROSEMIDE 20 MG TABS: 20 | 30 days supply | Qty: 30 | Fill #0

## 2020-02-23 NOTE — Telephone Encounter (Signed)
Pt called requesting refills. I advised pt she graduated from our clinic in April 2020 and was referred to [facility] in [place]. Pt was supposed to follow up with them within 6 months of graduating our clinic. Pt said she never scheduled an appt there. I sent a courtesy 30 day supply of cardiac medications and advised pt to schedule an office visit with [facility] as she is no longer followed in our clinic. Pt thanked me for the call and agreed to be seen in [place] office.

## 2020-02-29 ENCOUNTER — Ambulatory Visit (INDEPENDENT_AMBULATORY_CARE_PROVIDER_SITE_OTHER): Payer: BC Managed Care – PPO | Admitting: *Deleted

## 2020-02-29 DIAGNOSIS — I429 Cardiomyopathy, unspecified: Secondary | ICD-10-CM | POA: Diagnosis not present

## 2020-02-29 LAB — CUP PACEART REMOTE DEVICE CHECK
Battery Remaining Longevity: 11 mo
Battery Voltage: 2.86 V
Brady Statistic AP VP Percent: 0.16 %
Brady Statistic AP VS Percent: 0 %
Brady Statistic AS VP Percent: 99.81 %
Brady Statistic AS VS Percent: 0.03 %
Brady Statistic RA Percent Paced: 0.16 %
Brady Statistic RV Percent Paced: 99.97 %
Date Time Interrogation Session: 20210624043724
HighPow Impedance: 90 Ohm
Implantable Lead Implant Date: 20170206
Implantable Lead Implant Date: 20170206
Implantable Lead Implant Date: 20170206
Implantable Lead Location: 753858
Implantable Lead Location: 753859
Implantable Lead Location: 753860
Implantable Lead Model: 4398
Implantable Lead Model: 5076
Implantable Pulse Generator Implant Date: 20170206
Lead Channel Impedance Value: 1045 Ohm
Lead Channel Impedance Value: 1064 Ohm
Lead Channel Impedance Value: 1102 Ohm
Lead Channel Impedance Value: 1178 Ohm
Lead Channel Impedance Value: 1254 Ohm
Lead Channel Impedance Value: 456 Ohm
Lead Channel Impedance Value: 494 Ohm
Lead Channel Impedance Value: 551 Ohm
Lead Channel Impedance Value: 646 Ohm
Lead Channel Impedance Value: 646 Ohm
Lead Channel Impedance Value: 665 Ohm
Lead Channel Impedance Value: 760 Ohm
Lead Channel Impedance Value: 931 Ohm
Lead Channel Pacing Threshold Amplitude: 0.375 V
Lead Channel Pacing Threshold Amplitude: 0.5 V
Lead Channel Pacing Threshold Amplitude: 4.75 V
Lead Channel Pacing Threshold Pulse Width: 0.4 ms
Lead Channel Pacing Threshold Pulse Width: 0.4 ms
Lead Channel Pacing Threshold Pulse Width: 1 ms
Lead Channel Sensing Intrinsic Amplitude: 1.875 mV
Lead Channel Sensing Intrinsic Amplitude: 1.875 mV
Lead Channel Sensing Intrinsic Amplitude: 14.625 mV
Lead Channel Sensing Intrinsic Amplitude: 14.625 mV
Lead Channel Setting Pacing Amplitude: 1.5 V
Lead Channel Setting Pacing Amplitude: 2 V
Lead Channel Setting Pacing Amplitude: 3.25 V
Lead Channel Setting Pacing Pulse Width: 0.4 ms
Lead Channel Setting Pacing Pulse Width: 1 ms
Lead Channel Setting Sensing Sensitivity: 0.3 mV

## 2020-03-01 NOTE — Progress Notes (Signed)
Remote ICD transmission.   

## 2020-03-27 ENCOUNTER — Telehealth: Payer: Self-pay | Admitting: Cardiology

## 2020-03-27 DIAGNOSIS — I5022 Chronic systolic (congestive) heart failure: Secondary | ICD-10-CM

## 2020-03-27 MED ORDER — FUROSEMIDE 20 MG PO TABS: 20.0000 mg | ORAL_TABLET | Freq: Every day | ORAL | 0 refills | Status: DC

## 2020-03-27 NOTE — Telephone Encounter (Signed)
Done

## 2020-03-27 NOTE — Telephone Encounter (Signed)
Pt has an apt w/ Jonni Sanger on Monday-- she's needing a refill on her furosemide (LASIX) 20 MG tablet [[id number]]  Sent to [facility]

## 2020-03-31 NOTE — Progress Notes (Signed)
Cardiology Office Note  Date: 04/01/2020   ID: Mary Pratt, DOB 1970/10/21, MRN [id number]  PCP:  Caryl Bis, MD  Cardiologist:  Carlyle Dolly, MD Electrophysiologist:  None   Chief Complaint: Follow up NICM, Chronic HFrEF, CRT-D, anxiety,asthma, sickle cell trait.  History of Present Illness: Mary Pratt is a 49 y.o. female with a history of NICM, HFrEF,CRT-D (Medtronic CRT-D 10/14/2015 Dr Caryl Comes), Chronic HFrEF.  Echo 02/2015 EF 20% severe LV dilation. Cath: normal coronaries.  Echo 08/2015 EF 30%. Septal-lateral dyssynchrony. Medtronic CRT-D placed 10/2015. Echo 02/2017 EF 55-60% .  Last saw Mary Pratt Standard PA-C 12/15/2019: She was doing well. No CP, No SOB and minimal DOE, No presyncope or syncope.  02/29/2020 Remote Device Check Scheduled remote reviewed. Normal device function.   The device estimates 11 months until ERI, sent to triage Next remote 91 days.    Patient presents today with no particular complaints.  She denies any anginal or exertional symptoms, palpitations or arrhythmias, orthostatic symptoms, CVA or TIA-like symptoms, PND or orthopnea, claudication-like symptoms, DVT or PE-like symptoms, or lower extremity edema.  EKG today shows ventricular paced rhythm with a rate of 75.  Last echocardiogram showed ejection fraction improved with EF of 60 to 65%.     Past Medical History:  Diagnosis Date  . 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 Anxiety   . Asthma   . Cardiac defibrillator in place   . Chronic systolic CHF (congestive heart failure) ([place])    a. 02/2015 Echo: EF 20%, sev dil w/ diff HK, worse @ inf base. Tiv AI, mild MR, mod dil LA, PASP 36mmHg.  . Functional dyspepsia    early satiety   . GERD with stricture    dilated 2010  . HELICOBACTER PYLORI GASTRITIS 01/15/2009   dyspnea, ? reaction to Biaxin/amoxicillin  Pylera 01/15/09 - completed therapy     . NICM (nonischemic cardiomyopathy) ([place])    a. 02/2015 Cath: nl cors, EF 15%, mild PAH;   b. 02/2015 Enrolled in VEST trial.  . Sickle cell trait ([place])   . Situational depression    "son died"    Past Surgical History:  Procedure Laterality Date  . CARDIAC CATHETERIZATION N/A 02/26/2015   Procedure: Right/Left Heart Cath and Coronary Angiography;  Surgeon: Mary Sine, MD;  Location: [facility];  Service: Cardiovascular;  Laterality: N/A;  . COLPOSCOPY  2013   neg  . EP IMPLANTABLE DEVICE N/A 10/14/2015   Procedure: BiV ICD Insertion CRT-D;  Surgeon: Mary Sprang, MD;  Location: [facility];  Service: Cardiovascular;  Laterality: N/A;  . ESOPHAGOGASTRODUODENOSCOPY (EGD) WITH ESOPHAGEAL DILATION  11/30/2008   esophageal ring dilated 42 French, H. pylori gastritis  . TUBAL LIGATION  1991  . WISDOM TOOTH EXTRACTION      Current Outpatient Medications  Medication Sig Dispense Refill  . albuterol (PROVENTIL HFA;VENTOLIN HFA) 108 (90 BASE) MCG/ACT inhaler Inhale into the lungs every 6 (six) hours as needed for wheezing or shortness of breath.    . carvedilol (COREG) 25 MG tablet Take 1 tablet (25 mg total) by mouth 2 (two) times daily. HF FUND 10/27/2017 60 tablet 0  . dexlansoprazole (DEXILANT) 60 MG capsule Take 60 mg by mouth daily.    . fexofenadine (ALLEGRA) 180 MG tablet Take 180 mg by mouth 2 (two) times daily.     . Fluticasone-Salmeterol (ADVAIR DISKUS) 250-50 MCG/DOSE AEPB Inhale 1 puff into the lungs 2 (  two) times daily. 180 each 4  . furosemide (LASIX) 20 MG tablet Take 1 tablet (20 mg total) by mouth daily. 10 tablet 0  . hydrOXYzine (ATARAX/VISTARIL) 25 MG tablet Take 25 mg by mouth 3 (three) times daily.    . magnesium oxide (MAG-OX) 400 (241.3 Mg) MG tablet TAKE ONE TABLET BY MOUTH ONCE DAILY 30 tablet 6  . Omega-3 Fatty Acids (FISH OIL) 1000 MG CAPS Take 2 capsules (2,000 mg total) by mouth 2 (two) times daily. 120 capsule 3  . potassium chloride SA (K-DUR,KLOR-CON) 20 MEQ tablet Take 1 tablet (20 mEq total) by mouth 2 (two) times daily. 60 tablet  6  . QNASL 80 MCG/ACT AERS Place 1 spray into the nose daily as needed (allergic rhinitis).     . sacubitril-valsartan (ENTRESTO) 24-26 MG Take 1 tablet by mouth 2 (two) times daily. 60 tablet 6  . spironolactone (ALDACTONE) 25 MG tablet Take 1 tablet (25 mg total) by mouth daily. HF FUND 10/27/2017 30 tablet 0  . busPIRone (BUSPAR) 15 MG tablet Take 15 mg by mouth 3 (three) times daily. (Patient not taking: Reported on 04/01/2020)    . butalbital-acetaminophen-caffeine (FIORICET) 50-325-40 MG tablet Take 1-2 tablets by mouth every 6 (six) hours as needed for headache. (Patient not taking: Reported on 04/01/2020) 20 tablet 0  . citalopram (CELEXA) 20 MG tablet Take 20 mg by mouth daily. (Patient not taking: Reported on 04/01/2020)    . sertraline (ZOLOFT) 100 MG tablet Take 100 mg by mouth daily.     No current facility-administered medications for this visit.   Allergies:  Amoxicillin and Clarithromycin   Social History: The patient  reports that she has never smoked. She has never used smokeless tobacco. She reports that she does not drink alcohol and does not use drugs.   Family History: The patient's family history includes Brain cancer in her maternal uncle; Breast cancer in her maternal aunt; Diabetes in her mother; Hypertension in her mother; Hypothyroidism in her mother; Lung cancer in her maternal grandmother.   ROS:  Please see the history of present illness. Otherwise, complete review of systems is positive for none.  All other systems are reviewed and negative.   Physical Exam: VS:  BP 118/80   Pulse 75   Ht 5\' 1"  (1.549 m)   Wt 154 lb (69.9 kg)   SpO2 96%   BMI 29.10 kg/m , BMI Body mass index is 29.1 kg/m.  Wt Readings from Last 3 Encounters:  04/01/20 154 lb (69.9 kg)  01/21/20 162 lb 3.2 oz (73.6 kg)  12/15/19 164 lb (74.4 kg)    General: Patient appears comfortable at rest. Neck: Supple, no elevated JVP or carotid bruits, no thyromegaly. Lungs: Clear to auscultation,  nonlabored breathing at rest. Cardiac: Regular rate and rhythm, no S3 or significant systolic murmur, no pericardial rub. Extremities: No pitting edema, distal pulses 2+. Skin: Warm and dry. Musculoskeletal: No kyphosis. Neuropsychiatric: Alert and oriented x3, affect grossly appropriate.  ECG:  An ECG dated 04/01/2020 was personally reviewed today and demonstrated:  Electronic ventricular pacemaker rate of 75 bpm  Recent Labwork: 04/28/2019: B Natriuretic Peptide 11.1; Hemoglobin 12.4; Platelets 249 12/15/2019: BUN 8; Creatinine, Ser 0.89; Magnesium 2.0; Potassium 4.0; Sodium 138     Component Value Date/Time   CHOL 165 10/27/2017 1529   TRIG 302 (H) 10/27/2017 1529   HDL 43 10/27/2017 1529   CHOLHDL 3.8 10/27/2017 1529   VLDL 60 (H) 10/27/2017 1529   LDLCALC 62 10/27/2017  1529    Other Studies Reviewed Today:  08/01/2018: TTE Study Conclusions - Left ventricle: Global longitudinal strain is -17.6% The cavity  size was normal. Wall thickness was normal. Systolic function was  normal. The estimated ejection fraction was in the range of 60%  to 65%. Left ventricular diastolic function parameters were  normal.  - Pulmonary arteries: PA peak pressure: 31 mm Hg (S).  - Pericardium, extracardiac: A trivial pericardial effusion was  identified.   05/20/2015: CPX Conclusion: Exercise testing with gas-exchange demonstrates a  moderate functional impairment when compared to matched sedentary  norms. The patient appears to be circulatory limited primarily  due to HF with blunted HR, BP and O2 response to the exercise.     02/26/2015: R/LHC  There is severe left ventricular systolic dysfunction.  Very mild elevation of right sided heart pressures  Central aortic oxygen saturation 98%; pulmonary artery oxygen saturation 65%.  Dilated severe nonischemic cardiomyopathy with an ejection fraction of 15%. Normal coronary arteries. Mild pulmonary  hypertension.  RECOMMENDATION: Aggressive medical therapy for her nonischemic cardiomyopathy with medication titration over the next several months. Consider interim LifeVest, pending follow-up LV function assessment in several months.    Assessment and Plan:  1. Chronic systolic heart failure ([place])   2. Nonischemic cardiomyopathy ([place])   3. AICD (automatic cardioverter/defibrillator) present    1. Chronic systolic heart failure ([place]) Last echocardiogram in 2019 showed return of EF back to 60 to 65%.  Denies any dyspnea on exertion, weight gain, or lower extremity edema.  Continue Coreg 25 mg p.o. twice daily.  Lasix 20 mg daily.  Entresto 24/26 mg p.o. twice daily.  Spironolactone 25 mg p.o. daily.  2. Nonischemic cardiomyopathy ([place]) As mentioned above EF has returned to normal with recent EF 60 to 65% in 2019.  No symptoms  3. AICD (automatic cardioverter/defibrillator) present Last remote check 02/29/2020 showed normal device function.  Estimated 11 months until ERI.  Sees Dr. Caryl Comes EP.  Medication Adjustments/Labs and Tests Ordered: Current medicines are reviewed at length with the patient today.  Concerns regarding medicines are outlined above.   Disposition: Follow-up with Dr. Harl Bowie or APP 6 months  Signed, Levell July, NP 04/01/2020 2:52 PM    [facility] at [place], [place], [place] [address] Phone: [phone]; Fax: [phone]

## 2020-04-01 ENCOUNTER — Ambulatory Visit (INDEPENDENT_AMBULATORY_CARE_PROVIDER_SITE_OTHER): Payer: BC Managed Care – PPO | Admitting: Family Medicine

## 2020-04-01 ENCOUNTER — Encounter: Payer: Self-pay | Admitting: Family Medicine

## 2020-04-01 ENCOUNTER — Other Ambulatory Visit: Payer: Self-pay

## 2020-04-01 ENCOUNTER — Ambulatory Visit (INDEPENDENT_AMBULATORY_CARE_PROVIDER_SITE_OTHER): Payer: BC Managed Care – PPO | Admitting: *Deleted

## 2020-04-01 VITALS — BP 118/80 | HR 75 | Ht 61.0 in | Wt 154.0 lb

## 2020-04-01 DIAGNOSIS — Z95810 Presence of automatic (implantable) cardiac defibrillator: Secondary | ICD-10-CM | POA: Diagnosis not present

## 2020-04-01 DIAGNOSIS — I428 Other cardiomyopathies: Secondary | ICD-10-CM

## 2020-04-01 DIAGNOSIS — I5022 Chronic systolic (congestive) heart failure: Secondary | ICD-10-CM

## 2020-04-01 DIAGNOSIS — I429 Cardiomyopathy, unspecified: Secondary | ICD-10-CM

## 2020-04-01 LAB — CUP PACEART REMOTE DEVICE CHECK
Battery Remaining Longevity: 9 mo
Battery Voltage: 2.86 V
Brady Statistic AP VP Percent: 0.03 %
Brady Statistic AP VS Percent: 0 %
Brady Statistic AS VP Percent: 99.94 %
Brady Statistic AS VS Percent: 0.03 %
Brady Statistic RA Percent Paced: 0.03 %
Brady Statistic RV Percent Paced: 99.97 %
Date Time Interrogation Session: 20210726043725
HighPow Impedance: 67 Ohm
Implantable Lead Implant Date: 20170206
Implantable Lead Implant Date: 20170206
Implantable Lead Implant Date: 20170206
Implantable Lead Location: 753858
Implantable Lead Location: 753859
Implantable Lead Location: 753860
Implantable Lead Model: 4398
Implantable Lead Model: 5076
Implantable Pulse Generator Implant Date: 20170206
Lead Channel Impedance Value: 1045 Ohm
Lead Channel Impedance Value: 1102 Ohm
Lead Channel Impedance Value: 1121 Ohm
Lead Channel Impedance Value: 1254 Ohm
Lead Channel Impedance Value: 1311 Ohm
Lead Channel Impedance Value: 456 Ohm
Lead Channel Impedance Value: 494 Ohm
Lead Channel Impedance Value: 513 Ohm
Lead Channel Impedance Value: 570 Ohm
Lead Channel Impedance Value: 665 Ohm
Lead Channel Impedance Value: 722 Ohm
Lead Channel Impedance Value: 760 Ohm
Lead Channel Impedance Value: 950 Ohm
Lead Channel Pacing Threshold Amplitude: 0.375 V
Lead Channel Pacing Threshold Amplitude: 0.625 V
Lead Channel Pacing Threshold Amplitude: 4.75 V
Lead Channel Pacing Threshold Pulse Width: 0.4 ms
Lead Channel Pacing Threshold Pulse Width: 0.4 ms
Lead Channel Pacing Threshold Pulse Width: 1 ms
Lead Channel Sensing Intrinsic Amplitude: 2.375 mV
Lead Channel Sensing Intrinsic Amplitude: 2.375 mV
Lead Channel Sensing Intrinsic Amplitude: 9.875 mV
Lead Channel Sensing Intrinsic Amplitude: 9.875 mV
Lead Channel Setting Pacing Amplitude: 1.5 V
Lead Channel Setting Pacing Amplitude: 2 V
Lead Channel Setting Pacing Amplitude: 3.25 V
Lead Channel Setting Pacing Pulse Width: 0.4 ms
Lead Channel Setting Pacing Pulse Width: 1 ms
Lead Channel Setting Sensing Sensitivity: 0.3 mV

## 2020-04-01 NOTE — Patient Instructions (Addendum)

## 2020-04-02 NOTE — Addendum Note (Signed)
Addended by: Merlene Laughter on: 04/02/2020 04:52 PM   Modules accepted: Orders

## 2020-04-04 ENCOUNTER — Other Ambulatory Visit: Payer: Self-pay | Admitting: Family Medicine

## 2020-04-04 DIAGNOSIS — I5022 Chronic systolic (congestive) heart failure: Secondary | ICD-10-CM

## 2020-04-04 MED ORDER — CARVEDILOL 25 MG PO TABS: 25.0000 mg | ORAL_TABLET | Freq: Two times a day (BID) | ORAL | 11 refills | Status: DC

## 2020-04-04 MED ORDER — FUROSEMIDE 20 MG PO TABS: 20.0000 mg | ORAL_TABLET | Freq: Every day | ORAL | 11 refills | Status: DC

## 2020-04-04 MED ORDER — SPIRONOLACTONE 25 MG PO TABS: 25.0000 mg | ORAL_TABLET | Freq: Every day | ORAL | 11 refills | Status: DC

## 2020-04-04 NOTE — Telephone Encounter (Signed)
Refill complete 

## 2020-04-04 NOTE — Progress Notes (Signed)
Remote ICD transmission.   

## 2020-04-04 NOTE — Telephone Encounter (Signed)
Patient was seen in office on 04/01/2020 - was told that the following medications would be called in.   Lasix Coreg Aldactone  Patient is out of medication.  Please call Eben Burow [place]

## 2020-05-02 ENCOUNTER — Ambulatory Visit (INDEPENDENT_AMBULATORY_CARE_PROVIDER_SITE_OTHER): Payer: BC Managed Care – PPO | Admitting: *Deleted

## 2020-05-02 DIAGNOSIS — I429 Cardiomyopathy, unspecified: Secondary | ICD-10-CM

## 2020-05-02 LAB — CUP PACEART REMOTE DEVICE CHECK
Battery Remaining Longevity: 8 mo
Battery Voltage: 2.85 V
Brady Statistic AP VP Percent: 0.13 %
Brady Statistic AP VS Percent: 0 %
Brady Statistic AS VP Percent: 99.83 %
Brady Statistic AS VS Percent: 0.04 %
Brady Statistic RA Percent Paced: 0.13 %
Brady Statistic RV Percent Paced: 99.96 %
Date Time Interrogation Session: 20210826012203
HighPow Impedance: 64 Ohm
Implantable Lead Implant Date: 20170206
Implantable Lead Implant Date: 20170206
Implantable Lead Implant Date: 20170206
Implantable Lead Location: 753858
Implantable Lead Location: 753859
Implantable Lead Location: 753860
Implantable Lead Model: 4398
Implantable Lead Model: 5076
Implantable Pulse Generator Implant Date: 20170206
Lead Channel Impedance Value: 1045 Ohm
Lead Channel Impedance Value: 1121 Ohm
Lead Channel Impedance Value: 1121 Ohm
Lead Channel Impedance Value: 1311 Ohm
Lead Channel Impedance Value: 1330 Ohm
Lead Channel Impedance Value: 456 Ohm
Lead Channel Impedance Value: 494 Ohm
Lead Channel Impedance Value: 513 Ohm
Lead Channel Impedance Value: 646 Ohm
Lead Channel Impedance Value: 665 Ohm
Lead Channel Impedance Value: 760 Ohm
Lead Channel Impedance Value: 760 Ohm
Lead Channel Impedance Value: 931 Ohm
Lead Channel Pacing Threshold Amplitude: 0.375 V
Lead Channel Pacing Threshold Amplitude: 0.625 V
Lead Channel Pacing Threshold Amplitude: 4.75 V
Lead Channel Pacing Threshold Pulse Width: 0.4 ms
Lead Channel Pacing Threshold Pulse Width: 0.4 ms
Lead Channel Pacing Threshold Pulse Width: 1 ms
Lead Channel Sensing Intrinsic Amplitude: 0.5 mV
Lead Channel Sensing Intrinsic Amplitude: 0.5 mV
Lead Channel Sensing Intrinsic Amplitude: 7.875 mV
Lead Channel Sensing Intrinsic Amplitude: 7.875 mV
Lead Channel Setting Pacing Amplitude: 1.5 V
Lead Channel Setting Pacing Amplitude: 2 V
Lead Channel Setting Pacing Amplitude: 3.25 V
Lead Channel Setting Pacing Pulse Width: 0.4 ms
Lead Channel Setting Pacing Pulse Width: 1 ms
Lead Channel Setting Sensing Sensitivity: 0.3 mV

## 2020-05-07 NOTE — Progress Notes (Signed)
Remote ICD transmission.   

## 2020-05-30 ENCOUNTER — Ambulatory Visit (INDEPENDENT_AMBULATORY_CARE_PROVIDER_SITE_OTHER): Payer: BC Managed Care – PPO | Admitting: Emergency Medicine

## 2020-05-30 DIAGNOSIS — I429 Cardiomyopathy, unspecified: Secondary | ICD-10-CM

## 2020-05-31 LAB — CUP PACEART REMOTE DEVICE CHECK
Battery Remaining Longevity: 8 mo
Battery Voltage: 2.84 V
Brady Statistic AP VP Percent: 0.03 %
Brady Statistic AP VS Percent: 0 %
Brady Statistic AS VP Percent: 99.94 %
Brady Statistic AS VS Percent: 0.02 %
Brady Statistic RA Percent Paced: 0.03 %
Brady Statistic RV Percent Paced: 99.97 %
Date Time Interrogation Session: 20210923211805
HighPow Impedance: 69 Ohm
Implantable Lead Implant Date: 20170206
Implantable Lead Implant Date: 20170206
Implantable Lead Implant Date: 20170206
Implantable Lead Location: 753858
Implantable Lead Location: 753859
Implantable Lead Location: 753860
Implantable Lead Model: 4398
Implantable Lead Model: 5076
Implantable Pulse Generator Implant Date: 20170206
Lead Channel Impedance Value: 1064 Ohm
Lead Channel Impedance Value: 1121 Ohm
Lead Channel Impedance Value: 1159 Ohm
Lead Channel Impedance Value: 1311 Ohm
Lead Channel Impedance Value: 1368 Ohm
Lead Channel Impedance Value: 456 Ohm
Lead Channel Impedance Value: 494 Ohm
Lead Channel Impedance Value: 513 Ohm
Lead Channel Impedance Value: 608 Ohm
Lead Channel Impedance Value: 703 Ohm
Lead Channel Impedance Value: 760 Ohm
Lead Channel Impedance Value: 779 Ohm
Lead Channel Impedance Value: 988 Ohm
Lead Channel Pacing Threshold Amplitude: 0.375 V
Lead Channel Pacing Threshold Amplitude: 0.5 V
Lead Channel Pacing Threshold Amplitude: 4.75 V
Lead Channel Pacing Threshold Pulse Width: 0.4 ms
Lead Channel Pacing Threshold Pulse Width: 0.4 ms
Lead Channel Pacing Threshold Pulse Width: 1 ms
Lead Channel Sensing Intrinsic Amplitude: 1.75 mV
Lead Channel Sensing Intrinsic Amplitude: 1.75 mV
Lead Channel Sensing Intrinsic Amplitude: 7.875 mV
Lead Channel Sensing Intrinsic Amplitude: 7.875 mV
Lead Channel Setting Pacing Amplitude: 1.5 V
Lead Channel Setting Pacing Amplitude: 2 V
Lead Channel Setting Pacing Amplitude: 3.25 V
Lead Channel Setting Pacing Pulse Width: 0.4 ms
Lead Channel Setting Pacing Pulse Width: 1 ms
Lead Channel Setting Sensing Sensitivity: 0.3 mV

## 2020-06-04 NOTE — Progress Notes (Signed)
Remote ICD transmission.   

## 2020-06-05 ENCOUNTER — Other Ambulatory Visit: Payer: Self-pay | Admitting: Physician Assistant

## 2020-07-03 ENCOUNTER — Ambulatory Visit (INDEPENDENT_AMBULATORY_CARE_PROVIDER_SITE_OTHER): Payer: BC Managed Care – PPO

## 2020-07-03 DIAGNOSIS — I428 Other cardiomyopathies: Secondary | ICD-10-CM

## 2020-07-03 LAB — CUP PACEART REMOTE DEVICE CHECK
Battery Remaining Longevity: 6 mo
Battery Voltage: 2.83 V
Brady Statistic AP VP Percent: 0 %
Brady Statistic AP VS Percent: 0 %
Brady Statistic AS VP Percent: 99.97 %
Brady Statistic AS VS Percent: 0.03 %
Brady Statistic RA Percent Paced: 0 %
Brady Statistic RV Percent Paced: 99.96 %
Date Time Interrogation Session: 20211027033426
HighPow Impedance: 66 Ohm
Implantable Lead Implant Date: 20170206
Implantable Lead Implant Date: 20170206
Implantable Lead Implant Date: 20170206
Implantable Lead Location: 753858
Implantable Lead Location: 753859
Implantable Lead Location: 753860
Implantable Lead Model: 4398
Implantable Lead Model: 5076
Implantable Pulse Generator Implant Date: 20170206
Lead Channel Impedance Value: 1064 Ohm
Lead Channel Impedance Value: 1159 Ohm
Lead Channel Impedance Value: 1178 Ohm
Lead Channel Impedance Value: 1330 Ohm
Lead Channel Impedance Value: 1368 Ohm
Lead Channel Impedance Value: 494 Ohm
Lead Channel Impedance Value: 494 Ohm
Lead Channel Impedance Value: 494 Ohm
Lead Channel Impedance Value: 551 Ohm
Lead Channel Impedance Value: 722 Ohm
Lead Channel Impedance Value: 760 Ohm
Lead Channel Impedance Value: 760 Ohm
Lead Channel Impedance Value: 950 Ohm
Lead Channel Pacing Threshold Amplitude: 0.375 V
Lead Channel Pacing Threshold Amplitude: 0.625 V
Lead Channel Pacing Threshold Amplitude: 4.75 V
Lead Channel Pacing Threshold Pulse Width: 0.4 ms
Lead Channel Pacing Threshold Pulse Width: 0.4 ms
Lead Channel Pacing Threshold Pulse Width: 1 ms
Lead Channel Sensing Intrinsic Amplitude: 1 mV
Lead Channel Sensing Intrinsic Amplitude: 1 mV
Lead Channel Sensing Intrinsic Amplitude: 7.875 mV
Lead Channel Sensing Intrinsic Amplitude: 7.875 mV
Lead Channel Setting Pacing Amplitude: 1.5 V
Lead Channel Setting Pacing Amplitude: 2 V
Lead Channel Setting Pacing Amplitude: 3.25 V
Lead Channel Setting Pacing Pulse Width: 0.4 ms
Lead Channel Setting Pacing Pulse Width: 1 ms
Lead Channel Setting Sensing Sensitivity: 0.3 mV

## 2020-07-08 NOTE — Progress Notes (Signed)
Remote ICD transmission.   

## 2020-08-03 ENCOUNTER — Ambulatory Visit: Payer: BC Managed Care – PPO

## 2020-08-04 LAB — CUP PACEART REMOTE DEVICE CHECK
Battery Remaining Longevity: 6 mo
Battery Voltage: 2.82 V
Brady Statistic AP VP Percent: 0.03 %
Brady Statistic AP VS Percent: 0 %
Brady Statistic AS VP Percent: 99.95 %
Brady Statistic AS VS Percent: 0.02 %
Brady Statistic RA Percent Paced: 0.03 %
Brady Statistic RV Percent Paced: 99.97 %
Date Time Interrogation Session: 20211127012504
HighPow Impedance: 66 Ohm
Implantable Lead Implant Date: 20170206
Implantable Lead Implant Date: 20170206
Implantable Lead Implant Date: 20170206
Implantable Lead Location: 753858
Implantable Lead Location: 753859
Implantable Lead Location: 753860
Implantable Lead Model: 4398
Implantable Lead Model: 5076
Implantable Pulse Generator Implant Date: 20170206
Lead Channel Impedance Value: 1064 Ohm
Lead Channel Impedance Value: 1121 Ohm
Lead Channel Impedance Value: 1159 Ohm
Lead Channel Impedance Value: 1311 Ohm
Lead Channel Impedance Value: 1368 Ohm
Lead Channel Impedance Value: 456 Ohm
Lead Channel Impedance Value: 456 Ohm
Lead Channel Impedance Value: 608 Ohm
Lead Channel Impedance Value: 646 Ohm
Lead Channel Impedance Value: 703 Ohm
Lead Channel Impedance Value: 779 Ohm
Lead Channel Impedance Value: 779 Ohm
Lead Channel Impedance Value: 950 Ohm
Lead Channel Pacing Threshold Amplitude: 0.375 V
Lead Channel Pacing Threshold Amplitude: 0.5 V
Lead Channel Pacing Threshold Amplitude: 4.75 V
Lead Channel Pacing Threshold Pulse Width: 0.4 ms
Lead Channel Pacing Threshold Pulse Width: 0.4 ms
Lead Channel Pacing Threshold Pulse Width: 1 ms
Lead Channel Sensing Intrinsic Amplitude: 1 mV
Lead Channel Sensing Intrinsic Amplitude: 1 mV
Lead Channel Sensing Intrinsic Amplitude: 7.875 mV
Lead Channel Sensing Intrinsic Amplitude: 7.875 mV
Lead Channel Setting Pacing Amplitude: 1.5 V
Lead Channel Setting Pacing Amplitude: 2 V
Lead Channel Setting Pacing Amplitude: 3.25 V
Lead Channel Setting Pacing Pulse Width: 0.4 ms
Lead Channel Setting Pacing Pulse Width: 1 ms
Lead Channel Setting Sensing Sensitivity: 0.3 mV

## 2020-08-05 ENCOUNTER — Telehealth: Payer: Self-pay | Admitting: Emergency Medicine

## 2020-08-05 NOTE — Telephone Encounter (Signed)
Monthly report received due to 6 months to ERI  and showed optivol elevated. Patient reports no change in condition. Education on daily weights, limiting sodium intake and calling for change in condition. Overdue for follow-up with Dr Caryl Comes . Patient will be expecting call from scheduler for appointment .

## 2020-09-23 ENCOUNTER — Telehealth: Payer: Self-pay | Admitting: Emergency Medicine

## 2020-09-23 NOTE — Telephone Encounter (Signed)
Contacted patient by phone. Advised patient that monthly battery checks were scheduled. Last remote was on 09/02/2020 showing RRT 5 months. Patient enrolled in monthly battery checks. Next battery check scheduled on 11/04/2020. Patient states she has no questions at this time.

## 2020-10-04 ENCOUNTER — Ambulatory Visit (INDEPENDENT_AMBULATORY_CARE_PROVIDER_SITE_OTHER): Payer: BC Managed Care – PPO

## 2020-10-04 DIAGNOSIS — I429 Cardiomyopathy, unspecified: Secondary | ICD-10-CM

## 2020-10-05 LAB — CUP PACEART REMOTE DEVICE CHECK
Battery Remaining Longevity: 4 mo
Battery Voltage: 2.78 V
Brady Statistic AP VP Percent: 0.08 %
Brady Statistic AP VS Percent: 0 %
Brady Statistic AS VP Percent: 99.89 %
Brady Statistic AS VS Percent: 0.03 %
Brady Statistic RA Percent Paced: 0.08 %
Brady Statistic RV Percent Paced: 99.97 %
Date Time Interrogation Session: 20220128222122
HighPow Impedance: 66 Ohm
Implantable Lead Implant Date: 20170206
Implantable Lead Implant Date: 20170206
Implantable Lead Implant Date: 20170206
Implantable Lead Location: 753858
Implantable Lead Location: 753859
Implantable Lead Location: 753860
Implantable Lead Model: 4398
Implantable Lead Model: 5076
Implantable Pulse Generator Implant Date: 20170206
Lead Channel Impedance Value: 1045 Ohm
Lead Channel Impedance Value: 1102 Ohm
Lead Channel Impedance Value: 1121 Ohm
Lead Channel Impedance Value: 1254 Ohm
Lead Channel Impedance Value: 1273 Ohm
Lead Channel Impedance Value: 456 Ohm
Lead Channel Impedance Value: 494 Ohm
Lead Channel Impedance Value: 570 Ohm
Lead Channel Impedance Value: 646 Ohm
Lead Channel Impedance Value: 665 Ohm
Lead Channel Impedance Value: 722 Ohm
Lead Channel Impedance Value: 722 Ohm
Lead Channel Impedance Value: 950 Ohm
Lead Channel Pacing Threshold Amplitude: 0.375 V
Lead Channel Pacing Threshold Amplitude: 0.5 V
Lead Channel Pacing Threshold Amplitude: 4.75 V
Lead Channel Pacing Threshold Pulse Width: 0.4 ms
Lead Channel Pacing Threshold Pulse Width: 0.4 ms
Lead Channel Pacing Threshold Pulse Width: 1 ms
Lead Channel Sensing Intrinsic Amplitude: 0.75 mV
Lead Channel Sensing Intrinsic Amplitude: 0.75 mV
Lead Channel Sensing Intrinsic Amplitude: 8.375 mV
Lead Channel Sensing Intrinsic Amplitude: 8.375 mV
Lead Channel Setting Pacing Amplitude: 1.5 V
Lead Channel Setting Pacing Amplitude: 2 V
Lead Channel Setting Pacing Amplitude: 3.25 V
Lead Channel Setting Pacing Pulse Width: 0.4 ms
Lead Channel Setting Pacing Pulse Width: 1 ms
Lead Channel Setting Sensing Sensitivity: 0.3 mV

## 2020-10-07 ENCOUNTER — Ambulatory Visit: Payer: BC Managed Care – PPO | Admitting: Cardiology

## 2020-10-12 NOTE — Progress Notes (Signed)
Remote ICD transmission.   

## 2020-11-04 ENCOUNTER — Ambulatory Visit (INDEPENDENT_AMBULATORY_CARE_PROVIDER_SITE_OTHER): Payer: BC Managed Care – PPO

## 2020-11-04 DIAGNOSIS — I428 Other cardiomyopathies: Secondary | ICD-10-CM

## 2020-11-05 LAB — CUP PACEART REMOTE DEVICE CHECK
Battery Remaining Longevity: 3 mo
Battery Voltage: 2.76 V
Brady Statistic AP VP Percent: 0.01 %
Brady Statistic AP VS Percent: 0 %
Brady Statistic AS VP Percent: 99.97 %
Brady Statistic AS VS Percent: 0.02 %
Brady Statistic RA Percent Paced: 0.01 %
Brady Statistic RV Percent Paced: 99.97 %
Date Time Interrogation Session: 20220228012402
HighPow Impedance: 69 Ohm
Implantable Lead Implant Date: 20170206
Implantable Lead Implant Date: 20170206
Implantable Lead Implant Date: 20170206
Implantable Lead Location: 753858
Implantable Lead Location: 753859
Implantable Lead Location: 753860
Implantable Lead Model: 4398
Implantable Lead Model: 5076
Implantable Pulse Generator Implant Date: 20170206
Lead Channel Impedance Value: 1045 Ohm
Lead Channel Impedance Value: 1102 Ohm
Lead Channel Impedance Value: 1159 Ohm
Lead Channel Impedance Value: 1273 Ohm
Lead Channel Impedance Value: 1368 Ohm
Lead Channel Impedance Value: 456 Ohm
Lead Channel Impedance Value: 494 Ohm
Lead Channel Impedance Value: 551 Ohm
Lead Channel Impedance Value: 608 Ohm
Lead Channel Impedance Value: 665 Ohm
Lead Channel Impedance Value: 760 Ohm
Lead Channel Impedance Value: 779 Ohm
Lead Channel Impedance Value: 950 Ohm
Lead Channel Pacing Threshold Amplitude: 0.375 V
Lead Channel Pacing Threshold Amplitude: 0.625 V
Lead Channel Pacing Threshold Amplitude: 4.75 V
Lead Channel Pacing Threshold Pulse Width: 0.4 ms
Lead Channel Pacing Threshold Pulse Width: 0.4 ms
Lead Channel Pacing Threshold Pulse Width: 1 ms
Lead Channel Sensing Intrinsic Amplitude: 0.5 mV
Lead Channel Sensing Intrinsic Amplitude: 0.5 mV
Lead Channel Sensing Intrinsic Amplitude: 7.125 mV
Lead Channel Sensing Intrinsic Amplitude: 7.125 mV
Lead Channel Setting Pacing Amplitude: 1.5 V
Lead Channel Setting Pacing Amplitude: 2 V
Lead Channel Setting Pacing Amplitude: 3.25 V
Lead Channel Setting Pacing Pulse Width: 0.4 ms
Lead Channel Setting Pacing Pulse Width: 1 ms
Lead Channel Setting Sensing Sensitivity: 0.3 mV

## 2020-11-12 NOTE — Addendum Note (Signed)
Addended by: Cheri Kearns A on: 11/12/2020 03:08 PM   Modules accepted: Level of Service

## 2020-11-12 NOTE — Progress Notes (Signed)
Remote ICD transmission.   

## 2020-12-05 ENCOUNTER — Ambulatory Visit (INDEPENDENT_AMBULATORY_CARE_PROVIDER_SITE_OTHER): Payer: Medicaid Other

## 2020-12-05 DIAGNOSIS — I428 Other cardiomyopathies: Secondary | ICD-10-CM

## 2020-12-05 LAB — CUP PACEART REMOTE DEVICE CHECK
Battery Remaining Longevity: 2 mo
Battery Voltage: 2.73 V
Brady Statistic AP VP Percent: 0 %
Brady Statistic AP VS Percent: 0 %
Brady Statistic AS VP Percent: 99.97 %
Brady Statistic AS VS Percent: 0.02 %
Brady Statistic RA Percent Paced: 0.01 %
Brady Statistic RV Percent Paced: 99.97 %
Date Time Interrogation Session: 20220331033624
HighPow Impedance: 66 Ohm
Implantable Lead Implant Date: 20170206
Implantable Lead Implant Date: 20170206
Implantable Lead Implant Date: 20170206
Implantable Lead Location: 753858
Implantable Lead Location: 753859
Implantable Lead Location: 753860
Implantable Lead Model: 4398
Implantable Lead Model: 5076
Implantable Pulse Generator Implant Date: 20170206
Lead Channel Impedance Value: 1007 Ohm
Lead Channel Impedance Value: 1102 Ohm
Lead Channel Impedance Value: 1159 Ohm
Lead Channel Impedance Value: 437 Ohm
Lead Channel Impedance Value: 437 Ohm
Lead Channel Impedance Value: 551 Ohm
Lead Channel Impedance Value: 570 Ohm
Lead Channel Impedance Value: 608 Ohm
Lead Channel Impedance Value: 646 Ohm
Lead Channel Impedance Value: 665 Ohm
Lead Channel Impedance Value: 874 Ohm
Lead Channel Impedance Value: 931 Ohm
Lead Channel Impedance Value: 988 Ohm
Lead Channel Pacing Threshold Amplitude: 0.375 V
Lead Channel Pacing Threshold Amplitude: 0.625 V
Lead Channel Pacing Threshold Amplitude: 4.75 V
Lead Channel Pacing Threshold Pulse Width: 0.4 ms
Lead Channel Pacing Threshold Pulse Width: 0.4 ms
Lead Channel Pacing Threshold Pulse Width: 1 ms
Lead Channel Sensing Intrinsic Amplitude: 0.875 mV
Lead Channel Sensing Intrinsic Amplitude: 0.875 mV
Lead Channel Sensing Intrinsic Amplitude: 7.125 mV
Lead Channel Sensing Intrinsic Amplitude: 7.125 mV
Lead Channel Setting Pacing Amplitude: 1.5 V
Lead Channel Setting Pacing Amplitude: 2 V
Lead Channel Setting Pacing Amplitude: 3.25 V
Lead Channel Setting Pacing Pulse Width: 0.4 ms
Lead Channel Setting Pacing Pulse Width: 1 ms
Lead Channel Setting Sensing Sensitivity: 0.3 mV

## 2020-12-09 ENCOUNTER — Telehealth: Payer: Self-pay | Admitting: Internal Medicine

## 2020-12-09 NOTE — Telephone Encounter (Signed)
Returned patient call to discuss device "going off". Upon review of remote transmission 12/08/20, alert for device at RRT received. Discussed with patient device will continue to function as normal however will alarm daily for next 2 weeks. Discussed options for clinic appointment to turn off alarm. Patient states daily alarm is not bothersome. She is informed that she will need appointment with Dr. Caryl Comes to discuss next steps in gen change process. Patient verbalizes understanding. Will forward to scheduling.

## 2020-12-09 NOTE — Telephone Encounter (Signed)
New message:    Patient calling stating that her device has been going of for 2 days.

## 2020-12-12 ENCOUNTER — Other Ambulatory Visit: Payer: Self-pay

## 2020-12-12 ENCOUNTER — Telehealth: Payer: Self-pay

## 2020-12-12 ENCOUNTER — Telehealth (INDEPENDENT_AMBULATORY_CARE_PROVIDER_SITE_OTHER): Payer: Medicaid Other | Admitting: Internal Medicine

## 2020-12-12 VITALS — Ht 61.0 in | Wt 158.0 lb

## 2020-12-12 DIAGNOSIS — I5022 Chronic systolic (congestive) heart failure: Secondary | ICD-10-CM

## 2020-12-12 DIAGNOSIS — I428 Other cardiomyopathies: Secondary | ICD-10-CM

## 2020-12-12 DIAGNOSIS — Z95810 Presence of automatic (implantable) cardiac defibrillator: Secondary | ICD-10-CM

## 2020-12-12 NOTE — Telephone Encounter (Signed)
  Patient Consent for Virtual Visit         Mary Pratt has provided verbal consent on 12/12/2020 for a virtual visit (video or telephone).   CONSENT FOR VIRTUAL VISIT FOR:  Mary Pratt  By participating in this virtual visit I agree to the following:  I hereby voluntarily request, consent and authorize [facility] and its employed or contracted physicians, physician assistants, nurse practitioners or other licensed health care professionals (the Practitioner), to provide me with telemedicine health care services (the "Services") as deemed necessary by the treating Practitioner. I acknowledge and consent to receive the Services by the Practitioner via telemedicine. I understand that the telemedicine visit will involve communicating with the Practitioner through live audiovisual communication technology and the disclosure of certain medical information by electronic transmission. I acknowledge that I have been given the opportunity to request an in-person assessment or other available alternative prior to the telemedicine visit and am voluntarily participating in the telemedicine visit.  I understand that I have the right to withhold or withdraw my consent to the use of telemedicine in the course of my care at any time, without affecting my right to future care or treatment, and that the Practitioner or I may terminate the telemedicine visit at any time. I understand that I have the right to inspect all information obtained and/or recorded in the course of the telemedicine visit and may receive copies of available information for a reasonable fee.  I understand that some of the potential risks of receiving the Services via telemedicine include:  Marland Kitchen Delay or interruption in medical evaluation due to technological equipment failure or disruption; . Information transmitted may not be sufficient (e.g. poor resolution of images) to allow for appropriate medical decision making by the Practitioner;  and/or  . In rare instances, security protocols could fail, causing a breach of personal health information.  Furthermore, I acknowledge that it is my responsibility to provide information about my medical history, conditions and care that is complete and accurate to the best of my ability. I acknowledge that Practitioner's advice, recommendations, and/or decision may be based on factors not within their control, such as incomplete or inaccurate data provided by me or distortions of diagnostic images or specimens that may result from electronic transmissions. I understand that the practice of medicine is not an exact science and that Practitioner makes no warranties or guarantees regarding treatment outcomes. I acknowledge that a copy of this consent can be made available to me via my patient portal ([facility]), or I can request a printed copy by calling the office of [facility].    I understand that my insurance will be billed for this visit.   I have read or had this consent read to me. . I understand the contents of this consent, which adequately explains the benefits and risks of the Services being provided via telemedicine.  . I have been provided ample opportunity to ask questions regarding this consent and the Services and have had my questions answered to my satisfaction. . I give my informed consent for the services to be provided through the use of telemedicine in my medical care

## 2020-12-12 NOTE — Progress Notes (Signed)
Patient Care Team: Caryl Bis, MD as PCP - General (Family Medicine) Harl Bowie Alphonse Guild, MD as PCP - Cardiology (Cardiology)   HPI  Mary Pratt is a 50 y.o. female Seen in follow-up for        Electrophysiology TeleHealth Note   Due to national recommendations of social distancing due to [place] 19, an audio/video telehealth visit is felt to be most appropriate for this patient at this time.  See MyChart message from today for the patient's consent to telehealth for [facility].   Date:  12/12/2020   ID:  Mary Pratt, DOB 1970/10/02, MRN [id number]  Location: patient's home  Provider location: [address], [place]  Evaluation Performed: Follow-up visit  PCP:  Caryl Bis, MD  Cardiologist:    Electrophysiologist:  SK   Chief Complaint:  CHF remotely   History of Present Illness:    Mary Pratt is a 50 y.o. female who presents via audio/video conferencing for a telehealth visit today.  Since last being seen in our clinic for CRT-D implanted 2/17 for nonischemic cardiomyopathy and congestive heart failure, the patient reports  Doing pretty well  Now working for [employer] and sometimes 7d/w  Mild sob, but biggest complaint is fatigue. Occ edema No chest pain   DATE TEST EF   6/16 LHC  <15% % Cors  No angiogra dis  7/16 CMRI  31% LGE neg  11/19 Echo  60-65%      Date Cr K Hgb  10/19 0.96 3.9 12  4/21 0.89 4.0 12.4 (8/20)      The patient denies symptoms of fevers, chills, cough, or new SOB worrisome for COVID 19. *   Past Medical History:  Diagnosis Date  . 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 Anxiety   . Asthma   . Cardiac defibrillator in place   . Chronic systolic CHF (congestive heart failure) ([place])    a. 02/2015 Echo: EF 20%, sev dil w/ diff HK, worse @ inf base. Tiv AI, mild MR, mod dil LA, PASP 48mmHg.  . Functional dyspepsia    early satiety   . GERD with stricture    dilated 2010  . HELICOBACTER  PYLORI GASTRITIS 01/15/2009   dyspnea, ? reaction to Biaxin/amoxicillin  Pylera 01/15/09 - completed therapy     . NICM (nonischemic cardiomyopathy) ([place])    a. 02/2015 Cath: nl cors, EF 15%, mild PAH;  b. 02/2015 Enrolled in VEST trial.  . Sickle cell trait ([place])   . Situational depression    "son died"    Past Surgical History:  Procedure Laterality Date  . CARDIAC CATHETERIZATION N/A 02/26/2015   Procedure: Right/Left Heart Cath and Coronary Angiography;  Surgeon: Troy Sine, MD;  Location: [place] [facility];  Service: Cardiovascular;  Laterality: N/A;  . COLPOSCOPY  2013   neg  . EP IMPLANTABLE DEVICE N/A 10/14/2015   Procedure: BiV ICD Insertion CRT-D;  Surgeon: Deboraha Sprang, MD;  Location: [place] [facility];  Service: Cardiovascular;  Laterality: N/A;  . ESOPHAGOGASTRODUODENOSCOPY (EGD) WITH ESOPHAGEAL DILATION  11/30/2008   esophageal ring dilated 2 French, H. pylori gastritis  . TUBAL LIGATION  1991  . WISDOM TOOTH EXTRACTION      Current Outpatient Medications  Medication Sig Dispense Refill  . albuterol (PROVENTIL HFA;VENTOLIN HFA) 108 (90 BASE) MCG/ACT inhaler Inhale into the lungs every 6 (six) hours as needed for wheezing or  shortness of breath.    . carvedilol (COREG) 25 MG tablet Take 1 tablet (25 mg total) by mouth 2 (two) times daily. HF FUND 10/27/2017 60 tablet 11  . dexlansoprazole (DEXILANT) 60 MG capsule Take 60 mg by mouth daily.    . fexofenadine (ALLEGRA) 180 MG tablet Take 180 mg by mouth 2 (two) times daily.     . Fluticasone-Salmeterol (ADVAIR DISKUS) 250-50 MCG/DOSE AEPB Inhale 1 puff into the lungs 2 (two) times daily. 180 each 4  . furosemide (LASIX) 20 MG tablet Take 1 tablet (20 mg total) by mouth daily. 30 tablet 11  . sacubitril-valsartan (ENTRESTO) 24-26 MG Take 1 tablet by mouth 2 (two) times daily. 60 tablet 6  . sertraline (ZOLOFT) 100 MG tablet Take 100 mg by mouth daily.    Marland Kitchen spironolactone (ALDACTONE) 25 MG tablet Take 1 tablet (25 mg total)  by mouth daily. HF FUND 10/27/2017 30 tablet 11  . busPIRone (BUSPAR) 15 MG tablet Take 15 mg by mouth 3 (three) times daily. (Patient not taking: Reported on 04/01/2020)    . butalbital-acetaminophen-caffeine (FIORICET) 50-325-40 MG tablet Take 1-2 tablets by mouth every 6 (six) hours as needed for headache. (Patient not taking: Reported on 04/01/2020) 20 tablet 0  . citalopram (CELEXA) 20 MG tablet Take 20 mg by mouth daily. (Patient not taking: Reported on 04/01/2020)    . hydrOXYzine (ATARAX/VISTARIL) 25 MG tablet Take 25 mg by mouth 3 (three) times daily. (Patient not taking: Reported on 12/12/2020)    . magnesium oxide (MAG-OX) 400 (241.3 Mg) MG tablet TAKE ONE TABLET BY MOUTH ONCE DAILY (Patient not taking: Reported on 12/12/2020) 30 tablet 6  . Omega-3 Fatty Acids (FISH OIL) 1000 MG CAPS Take 2 capsules (2,000 mg total) by mouth 2 (two) times daily. (Patient not taking: Reported on 12/12/2020) 120 capsule 3  . potassium chloride SA (K-DUR,KLOR-CON) 20 MEQ tablet Take 1 tablet (20 mEq total) by mouth 2 (two) times daily. (Patient not taking: Reported on 12/12/2020) 60 tablet 6  . QNASL 80 MCG/ACT AERS Place 1 spray into the nose daily as needed (allergic rhinitis).  (Patient not taking: Reported on 12/12/2020)     No current facility-administered medications for this visit.    Allergies:   Amoxicillin and Clarithromycin   Social History:  The patient  reports that she has never smoked. She has never used smokeless tobacco. She reports that she does not drink alcohol and does not use drugs.   Family History:  The patient's   family history includes Brain cancer in her maternal uncle; Breast cancer in her maternal aunt; Diabetes in her mother; Hypertension in her mother; Hypothyroidism in her mother; Lung cancer in her maternal grandmother.   ROS:  Please see the history of present illness.   All other systems are personally reviewed and negative.    Exam:    Vital Signs:  Ht 5\' 1"  (1.549 m)   Wt 158  lb (71.7 kg)   BMI 29.85 kg/m     Labs/Other Tests and Data Reviewed:    Recent Labs: 12/15/2019: BUN 8; Creatinine, Ser 0.89; Magnesium 2.0; Potassium 4.0; Sodium 138   Wt Readings from Last 3 Encounters:  12/12/20 158 lb (71.7 kg)  04/01/20 154 lb (69.9 kg)  01/21/20 162 lb 3.2 oz (73.6 kg)     Other studies personally reviewed:      Last device remote is reviewed from [facility] PDF dated 3/22 which reveals normal device function but Battery approaching ERI  ,  arrhythmias - none     ASSESSMENT & PLAN:   NICM with interval EF normalization   CHF chronic diastolic/ systolic  CRT-ICD  Medtronic approaching ERI  High Risk Medication Surveillance Aldactone   Some edema  continue current meds and she is aware of the salt load from eating out-- doing more since working at [employer] and sometimes 7 days...  Device is approaching ERI   W interval normalization of LV function, important to retain CRT  She understands and chooses ICD We have reviewed the benefits and risks of generator replacement.  These include but are not limited to lead fracture and infection.  The patient understands, agrees and is willing to proceed.      COVID 19 screen The patient denies symptoms of COVID 19 at this time.  The importance of social distancing was discussed today.  Follow-up: TBD Next remote: As Scheduled   Current medicines are reviewed at length with the patient today.   The patient  concerns regarding her medicines.  The following changes were made today:    Labs/ tests ordered today include:  No orders of the defined types were placed in this encounter.   Future tests ( post COVID )   in   months  Patient Risk:  after full review of this patients clinical status, I feel that they are at moderate risk at this time.  Today, I have spent 15 minutes with the patient with telehealth technology discussing the above.  Signed, Virl Axe, MD  12/12/2020 2:41 PM     [place] [address] [phone] (office) [phone] (fax)

## 2020-12-17 NOTE — Patient Instructions (Signed)
Medication Instructions:  Your physician recommends that you continue on your current medications as directed. Please refer to the Current Medication list given to you today. *If you need a refill on your cardiac medications before your next appointment, please call your pharmacy*   Lab Work: Will need labs prior to generator change. If you have labs (blood work) drawn today and your tests are completely normal, you will receive your results only by: Marland Kitchen MyChart Message (if you have MyChart) OR . A paper copy in the mail If you have any lab test that is abnormal or we need to change your treatment, we will call you to review the results.   Testing/Procedures: Dr Caryl Comes has recommended a generator change for your device.   Follow-Up: At [facility], you and your health needs are our priority.  As part of our continuing mission to provide you with exceptional heart care, we have created designated Provider Care Teams.  These Care Teams include your primary Cardiologist (physician) and Advanced Practice Providers (APPs -  Physician Assistants and Nurse Practitioners) who all work together to provide you with the care you need, when you need it.  We recommend signing up for the patient portal called "MyChart".  Sign up information is provided on this After Visit Summary.  MyChart is used to connect with patients for Virtual Visits (Telemedicine).  Patients are able to view lab/test results, encounter notes, upcoming appointments, etc.  Non-urgent messages can be sent to your provider as well.   To learn more about what you can do with MyChart, go to [email or web address].    Your next appointment:   To be scheduled.

## 2020-12-18 NOTE — Progress Notes (Signed)
Remote ICD transmission.   

## 2020-12-24 DIAGNOSIS — I5022 Chronic systolic (congestive) heart failure: Secondary | ICD-10-CM

## 2020-12-24 DIAGNOSIS — I428 Other cardiomyopathies: Secondary | ICD-10-CM

## 2020-12-24 DIAGNOSIS — Z01812 Encounter for preprocedural laboratory examination: Secondary | ICD-10-CM

## 2021-01-03 ENCOUNTER — Ambulatory Visit (INDEPENDENT_AMBULATORY_CARE_PROVIDER_SITE_OTHER): Payer: Medicaid Other

## 2021-01-03 DIAGNOSIS — I429 Cardiomyopathy, unspecified: Secondary | ICD-10-CM

## 2021-01-03 LAB — CUP PACEART REMOTE DEVICE CHECK
Battery Remaining Longevity: 1 mo — CL
Battery Voltage: 2.7 V
Brady Statistic AP VP Percent: 0.42 %
Brady Statistic AP VS Percent: 0.01 %
Brady Statistic AS VP Percent: 99.55 %
Brady Statistic AS VS Percent: 0.03 %
Brady Statistic RA Percent Paced: 0.42 %
Brady Statistic RV Percent Paced: 99.96 %
Date Time Interrogation Session: 20220429001804
HighPow Impedance: 70 Ohm
Implantable Lead Implant Date: 20170206
Implantable Lead Implant Date: 20170206
Implantable Lead Implant Date: 20170206
Implantable Lead Location: 753858
Implantable Lead Location: 753859
Implantable Lead Location: 753860
Implantable Lead Model: 4398
Implantable Lead Model: 5076
Implantable Pulse Generator Implant Date: 20170206
Lead Channel Impedance Value: 1064 Ohm
Lead Channel Impedance Value: 1159 Ohm
Lead Channel Impedance Value: 1178 Ohm
Lead Channel Impedance Value: 1368 Ohm
Lead Channel Impedance Value: 1387 Ohm
Lead Channel Impedance Value: 456 Ohm
Lead Channel Impedance Value: 494 Ohm
Lead Channel Impedance Value: 570 Ohm
Lead Channel Impedance Value: 608 Ohm
Lead Channel Impedance Value: 722 Ohm
Lead Channel Impedance Value: 779 Ohm
Lead Channel Impedance Value: 817 Ohm
Lead Channel Impedance Value: 988 Ohm
Lead Channel Pacing Threshold Amplitude: 0.375 V
Lead Channel Pacing Threshold Amplitude: 0.625 V
Lead Channel Pacing Threshold Amplitude: 4.75 V
Lead Channel Pacing Threshold Pulse Width: 0.4 ms
Lead Channel Pacing Threshold Pulse Width: 0.4 ms
Lead Channel Pacing Threshold Pulse Width: 1 ms
Lead Channel Sensing Intrinsic Amplitude: 0.875 mV
Lead Channel Sensing Intrinsic Amplitude: 0.875 mV
Lead Channel Sensing Intrinsic Amplitude: 7.625 mV
Lead Channel Sensing Intrinsic Amplitude: 7.625 mV
Lead Channel Setting Pacing Amplitude: 1.5 V
Lead Channel Setting Pacing Amplitude: 2 V
Lead Channel Setting Pacing Amplitude: 3.25 V
Lead Channel Setting Pacing Pulse Width: 0.4 ms
Lead Channel Setting Pacing Pulse Width: 1 ms
Lead Channel Setting Sensing Sensitivity: 0.3 mV

## 2021-01-22 ENCOUNTER — Other Ambulatory Visit: Payer: Self-pay

## 2021-01-22 ENCOUNTER — Telehealth: Payer: Self-pay

## 2021-01-22 ENCOUNTER — Other Ambulatory Visit: Payer: BC Managed Care – PPO | Admitting: *Deleted

## 2021-01-22 ENCOUNTER — Other Ambulatory Visit (HOSPITAL_COMMUNITY)
Admission: RE | Admit: 2021-01-22 | Discharge: 2021-01-22 | Disposition: A | Discharge status: Home / Self Care | Payer: BC Managed Care – PPO | Source: Ambulatory Visit | Attending: Internal Medicine | Admitting: Internal Medicine

## 2021-01-22 DIAGNOSIS — Z881 Allergy status to other antibiotic agents status: Secondary | ICD-10-CM | POA: Diagnosis not present

## 2021-01-22 DIAGNOSIS — Z8249 Family history of ischemic heart disease and other diseases of the circulatory system: Secondary | ICD-10-CM | POA: Diagnosis not present

## 2021-01-22 DIAGNOSIS — Z7951 Long term (current) use of inhaled steroids: Secondary | ICD-10-CM | POA: Diagnosis not present

## 2021-01-22 DIAGNOSIS — I5022 Chronic systolic (congestive) heart failure: Secondary | ICD-10-CM

## 2021-01-22 DIAGNOSIS — Z8719 Personal history of other diseases of the digestive system: Secondary | ICD-10-CM | POA: Diagnosis not present

## 2021-01-22 DIAGNOSIS — I110 Hypertensive heart disease with heart failure: Secondary | ICD-10-CM | POA: Diagnosis not present

## 2021-01-22 DIAGNOSIS — Z4502 Encounter for adjustment and management of automatic implantable cardiac defibrillator: Secondary | ICD-10-CM | POA: Diagnosis not present

## 2021-01-22 DIAGNOSIS — D573 Sickle-cell trait: Secondary | ICD-10-CM | POA: Diagnosis not present

## 2021-01-22 DIAGNOSIS — I5032 Chronic diastolic (congestive) heart failure: Secondary | ICD-10-CM | POA: Diagnosis not present

## 2021-01-22 DIAGNOSIS — Z880 Allergy status to penicillin: Secondary | ICD-10-CM | POA: Diagnosis not present

## 2021-01-22 DIAGNOSIS — Z79899 Other long term (current) drug therapy: Secondary | ICD-10-CM | POA: Diagnosis not present

## 2021-01-22 DIAGNOSIS — I428 Other cardiomyopathies: Secondary | ICD-10-CM

## 2021-01-22 DIAGNOSIS — Z01812 Encounter for preprocedural laboratory examination: Secondary | ICD-10-CM

## 2021-01-22 DIAGNOSIS — Z20822 Contact with and (suspected) exposure to covid-19: Secondary | ICD-10-CM | POA: Insufficient documentation

## 2021-01-22 LAB — CBC
Hematocrit: 38.2 % (ref 34.0–46.6)
Hemoglobin: 12.9 g/dL (ref 11.1–15.9)
MCH: 28 pg (ref 26.6–33.0)
MCHC: 33.8 g/dL (ref 31.5–35.7)
MCV: 83 fL (ref 79–97)
Platelets: 236 10*3/uL (ref 150–450)
RBC: 4.61 x10E6/uL (ref 3.77–5.28)
RDW: 15.2 % (ref 11.7–15.4)
WBC: 5.3 10*3/uL (ref 3.4–10.8)

## 2021-01-22 LAB — BASIC METABOLIC PANEL
BUN/Creatinine Ratio: 13 (ref 9–23)
BUN: 13 mg/dL (ref 6–24)
CO2: 25 mmol/L (ref 20–29)
Calcium: 9.5 mg/dL (ref 8.7–10.2)
Chloride: 103 mmol/L (ref 96–106)
Creatinine, Ser: 0.98 mg/dL (ref 0.57–1.00)
Glucose: 147 mg/dL — ABNORMAL HIGH (ref 65–99)
Potassium: 3.6 mmol/L (ref 3.5–5.2)
Sodium: 139 mmol/L (ref 134–144)
eGFR: 70 mL/min/{1.73_m2} (ref >59)

## 2021-01-22 LAB — SARS CORONAVIRUS 2 (TAT 6-24 HRS): SARS Coronavirus 2: NEGATIVE

## 2021-01-22 NOTE — Telephone Encounter (Signed)
Spoke with pt and advised due to an emergent procedure being scheduled pt will now need to arrive at [facility] on 01/24/2021 at 11am for 1pm proedure start time.  Pt verbalizes understanding and agrees with current plan.

## 2021-01-23 NOTE — Pre-Procedure Instructions (Signed)
Instructed patient on the following items: Arrival time 1100 Nothing to eat or drink after midnight No meds AM of procedure Responsible person to drive you home and stay with you for 24 hrs Wash with special soap night before and morning of procedure  

## 2021-01-23 NOTE — Progress Notes (Signed)
Remote ICD transmission.   

## 2021-01-24 ENCOUNTER — Ambulatory Visit (HOSPITAL_COMMUNITY)
Admission: RE | Admit: 2021-01-24 | Discharge: 2021-01-24 | Disposition: A | Discharge status: Home / Self Care | Payer: BC Managed Care – PPO | Attending: Internal Medicine | Admitting: Internal Medicine

## 2021-01-24 ENCOUNTER — Other Ambulatory Visit: Payer: Self-pay

## 2021-01-24 ENCOUNTER — Ambulatory Visit (HOSPITAL_COMMUNITY)
Admission: RE | Disposition: A | Discharge status: Home / Self Care | Payer: Self-pay | Source: Home / Self Care | Attending: Internal Medicine

## 2021-01-24 DIAGNOSIS — I428 Other cardiomyopathies: Secondary | ICD-10-CM | POA: Diagnosis not present

## 2021-01-24 DIAGNOSIS — I110 Hypertensive heart disease with heart failure: Secondary | ICD-10-CM | POA: Diagnosis not present

## 2021-01-24 DIAGNOSIS — Z20822 Contact with and (suspected) exposure to covid-19: Secondary | ICD-10-CM | POA: Diagnosis not present

## 2021-01-24 DIAGNOSIS — I5032 Chronic diastolic (congestive) heart failure: Secondary | ICD-10-CM | POA: Diagnosis not present

## 2021-01-24 DIAGNOSIS — Z8719 Personal history of other diseases of the digestive system: Secondary | ICD-10-CM | POA: Insufficient documentation

## 2021-01-24 DIAGNOSIS — Z8249 Family history of ischemic heart disease and other diseases of the circulatory system: Secondary | ICD-10-CM | POA: Insufficient documentation

## 2021-01-24 DIAGNOSIS — D573 Sickle-cell trait: Secondary | ICD-10-CM | POA: Insufficient documentation

## 2021-01-24 DIAGNOSIS — Z7951 Long term (current) use of inhaled steroids: Secondary | ICD-10-CM | POA: Insufficient documentation

## 2021-01-24 DIAGNOSIS — Z4502 Encounter for adjustment and management of automatic implantable cardiac defibrillator: Secondary | ICD-10-CM | POA: Insufficient documentation

## 2021-01-24 DIAGNOSIS — Z880 Allergy status to penicillin: Secondary | ICD-10-CM | POA: Insufficient documentation

## 2021-01-24 DIAGNOSIS — Z881 Allergy status to other antibiotic agents status: Secondary | ICD-10-CM | POA: Insufficient documentation

## 2021-01-24 DIAGNOSIS — Z79899 Other long term (current) drug therapy: Secondary | ICD-10-CM | POA: Insufficient documentation

## 2021-01-24 HISTORY — PX: BIV ICD GENERATOR CHANGEOUT: EP1194

## 2021-01-24 LAB — PREGNANCY, URINE: Preg Test, Ur: NEGATIVE

## 2021-01-24 SURGERY — BIV ICD GENERATOR CHANGEOUT
Anesthesia: LOCAL

## 2021-01-24 MED ORDER — FENTANYL CITRATE (PF) 100 MCG/2ML IJ SOLN
INTRAMUSCULAR | Status: DC | PRN
  Administered 2021-01-24 (×3): 25 ug via INTRAVENOUS

## 2021-01-24 MED ORDER — SODIUM CHLORIDE 0.9 % IV SOLN
INTRAVENOUS | Status: AC
  Filled 2021-01-24: qty 2

## 2021-01-24 MED ORDER — CHLORHEXIDINE GLUCONATE 4 % EX LIQD: 4.0000 "application " | Freq: Once | CUTANEOUS | Status: DC

## 2021-01-24 MED ORDER — SODIUM CHLORIDE 0.9 % IV SOLN
80.0000 mg | INTRAVENOUS | Status: AC
  Administered 2021-01-24: 80 mg
  Filled 2021-01-24: qty 2

## 2021-01-24 MED ORDER — FENTANYL CITRATE (PF) 100 MCG/2ML IJ SOLN
INTRAMUSCULAR | Status: AC
  Filled 2021-01-24: qty 2

## 2021-01-24 MED ORDER — CEFAZOLIN SODIUM-DEXTROSE 2-3 GM-%(50ML) IV SOLR
INTRAVENOUS | Status: AC | PRN
  Administered 2021-01-24: 2 g via INTRAVENOUS

## 2021-01-24 MED ORDER — VANCOMYCIN HCL IN DEXTROSE 1-5 GM/200ML-% IV SOLN: 1000.0000 mg | INTRAVENOUS | Status: DC

## 2021-01-24 MED ORDER — SODIUM CHLORIDE 0.9 % IV SOLN: INTRAVENOUS | Status: DC

## 2021-01-24 MED ORDER — CARVEDILOL 25 MG PO TABS
25.0000 mg | ORAL_TABLET | Freq: Once | ORAL | Status: AC
  Administered 2021-01-24: 25 mg via ORAL
  Filled 2021-01-24: qty 1

## 2021-01-24 MED ORDER — LIDOCAINE HCL (PF) 1 % IJ SOLN
INTRAMUSCULAR | Status: DC | PRN
  Administered 2021-01-24: 30 mL

## 2021-01-24 MED ORDER — MIDAZOLAM HCL 5 MG/5ML IJ SOLN
INTRAMUSCULAR | Status: DC | PRN
  Administered 2021-01-24: 2 mg via INTRAVENOUS
  Administered 2021-01-24 (×2): 1 mg via INTRAVENOUS

## 2021-01-24 MED ORDER — MIDAZOLAM HCL 5 MG/5ML IJ SOLN
INTRAMUSCULAR | Status: AC
  Filled 2021-01-24: qty 5

## 2021-01-24 MED ORDER — ACETAMINOPHEN 325 MG PO TABS
325.0000 mg | ORAL_TABLET | ORAL | Status: DC | PRN
  Filled 2021-01-24: qty 2

## 2021-01-24 MED ORDER — LIDOCAINE HCL (PF) 1 % IJ SOLN
INTRAMUSCULAR | Status: AC
  Filled 2021-01-24: qty 60

## 2021-01-24 MED ORDER — CEFAZOLIN SODIUM-DEXTROSE 2-4 GM/100ML-% IV SOLN
INTRAVENOUS | Status: AC
  Filled 2021-01-24: qty 100

## 2021-01-24 MED ORDER — SPIRONOLACTONE 25 MG PO TABS
25.0000 mg | ORAL_TABLET | Freq: Once | ORAL | Status: AC
  Administered 2021-01-24: 25 mg via ORAL
  Filled 2021-01-24: qty 1

## 2021-01-24 MED ORDER — SODIUM CHLORIDE 0.9 % IV SOLN: INTRAVENOUS | Status: AC

## 2021-01-24 SURGICAL SUPPLY — 7 items
CABLE SURGICAL S-101-97-12 (CABLE) ×1 IMPLANT
DEVICE DISSECT PLASMABLAD 3.0S (MISCELLANEOUS) IMPLANT
HEMOSTAT SURGICEL 2X4 FIBR (HEMOSTASIS) ×1 IMPLANT
ICD CLARIA MRI DTMA1QQ (ICD Generator) ×1 IMPLANT
PAD PRO RADIOLUCENT 2001M-C (PAD) ×1 IMPLANT
PLASMABLADE 3.0S (MISCELLANEOUS) ×2
TRAY PACEMAKER INSERTION (PACKS) ×1 IMPLANT

## 2021-01-24 NOTE — Discharge Instructions (Signed)

## 2021-01-24 NOTE — H&P (Addendum)
ICD Criteria  Current LVEF:65%. Within 12 months prior to implant: No   Heart failure history: Yes, Class II  Cardiomyopathy history: Yes, Non-Ischemic Cardiomyopathy.  Atrial Fibrillation/Atrial Flutter: No.  Ventricular tachycardia history: No.  Cardiac arrest history: No.  History of syndromes with risk of sudden death: No.  Previous ICD: Yes, Reason for ICD:  Primary prevention.  Current ICD indication: Primary  PPM indication: No.  Class I or II Bradycardia indication present: Yes  Beta Blocker therapy for 3 or more months: Yes, prescribed.   Ace Inhibitor/ARB therapy for 3 or more months: Yes, prescribed.    I have seen Mary Pratt is a 50 y.o. femalepre-procedural and is seen for consideration of ICD implant for primary prevention of sudden death.  The patient's chart has been reviewed and they meet criteria for ICD implant.  I have had a thorough discussion with the patient reviewing options.  The patient and their family (if available) have had opportunities to ask questions and have them answered. The patient and I have decided together through the [place] Support Tool to reimplant ICD at this time.  Risks, benefits, alternatives to ICD implantation were discussed in detail with the patient today. The patient  understands that the risks include but are not limited to bleeding, infection, pneumothorax, perforation, tamponade, vascular damage, renal failure, MI, stroke, death, inappropriate shocks, and lead dislodgement and   wishes to proceed.       Patient Care Team: Caryl Bis, MD as PCP - General (Family Medicine) Harl Bowie Alphonse Guild, MD as PCP - Cardiology (Cardiology)   HPI  Mary Pratt is a 50 y.o. female admitted for ICD-CRT gen change. Initially implanted 2/17 w interval normalization of EF.    Tolerating meds, but did not take them this am  C/o HA today, similar to HA in the past assoc with allergies     DATE TEST EF     6/16 LHC  <15% % Cors  No angiogra dis  7/16 CMRI  31% LGE neg  11/19 Echo  60-65%          Date Cr K Hgb  10/19 0.96 3.9 12  4/21 0.89 4.0 12.4 (8/20)  5/22 0.98 3.6 12.9     Records and Results Reviewed   Past Medical History:  Diagnosis Date   Abnormal pap 9/13   ASCUS + HPV, 11/14 LGSIL   Anxiety    Asthma    Cardiac defibrillator in place    Chronic systolic CHF (congestive heart failure) ([place])    a. 02/2015 Echo: EF 20%, sev dil w/ diff HK, worse @ inf base. Tiv AI, mild MR, mod dil LA, PASP 37mmHg.   Functional dyspepsia    early satiety    GERD with stricture    dilated 1610   HELICOBACTER PYLORI GASTRITIS 01/15/2009   dyspnea, ? reaction to Biaxin/amoxicillin  Pylera 01/15/09 - completed therapy      NICM (nonischemic cardiomyopathy) ([place])    a. 02/2015 Cath: nl cors, EF 15%, mild PAH;  b. 02/2015 Enrolled in VEST trial.   Sickle cell trait ([place])    Situational depression    "son died"    Past Surgical History:  Procedure Laterality Date   CARDIAC CATHETERIZATION N/A 02/26/2015   Procedure: Right/Left Heart Cath and Coronary Angiography;  Surgeon: Troy Sine, MD;  Location: [place] [facility];  Service: Cardiovascular;  Laterality: N/A;   COLPOSCOPY  2013   neg  EP IMPLANTABLE DEVICE N/A 10/14/2015   Procedure: BiV ICD Insertion CRT-D;  Surgeon: Deboraha Sprang, MD;  Location: [place] [facility];  Service: Cardiovascular;  Laterality: N/A;   ESOPHAGOGASTRODUODENOSCOPY (EGD) WITH ESOPHAGEAL DILATION  11/30/2008   esophageal ring dilated 73 French, H. pylori gastritis   TUBAL LIGATION  1991   WISDOM TOOTH EXTRACTION      Current Facility-Administered Medications  Medication Dose Route Frequency Provider Last Rate Last Admin   0.9 %  sodium chloride infusion   Intravenous Continuous Deboraha Sprang, MD       chlorhexidine (HIBICLENS) 4 % liquid 4 application  4 application Topical Once Deboraha Sprang, MD       gentamicin (GARAMYCIN) 80 mg in sodium chloride  0.9 % 500 mL irrigation  80 mg Irrigation On Call Deboraha Sprang, MD       vancomycin (VANCOCIN) IVPB 1000 mg/200 mL premix  1,000 mg Intravenous On Call Deboraha Sprang, MD        Allergies  Allergen Reactions   Amoxicillin Other (See Comments)    REACTION: Tacycardia   Clarithromycin Hives    Breaks out and gets whelps      Social History   Tobacco Use   Smoking status: Never Smoker   Smokeless tobacco: Never Used  Vaping Use   Vaping Use: Never used  Substance Use Topics   Alcohol use: No   Drug use: No     Family History  Problem Relation Age of Onset   Diabetes Mother    Hypertension Mother    Hypothyroidism Mother    Breast cancer Maternal Aunt    Brain cancer Maternal Uncle    Lung cancer Maternal Grandmother    Colon cancer Neg Hx    Colon polyps Neg Hx    Esophageal cancer Neg Hx      Current Meds  Medication Sig   albuterol (PROVENTIL HFA;VENTOLIN HFA) 108 (90 BASE) MCG/ACT inhaler Inhale into the lungs every 6 (six) hours as needed for wheezing or shortness of breath.   azelastine (ASTELIN) 0.1 % nasal spray Place 2 sprays into both nostrils 2 (two) times daily. Use in each nostril as directed   carvedilol (COREG) 25 MG tablet Take 1 tablet (25 mg total) by mouth 2 (two) times daily. HF FUND 10/27/2017   dexlansoprazole (DEXILANT) 60 MG capsule Take 60 mg by mouth daily.   fexofenadine (ALLEGRA) 180 MG tablet Take 180 mg by mouth 2 (two) times daily.    Fluticasone-Salmeterol (ADVAIR DISKUS) 250-50 MCG/DOSE AEPB Inhale 1 puff into the lungs 2 (two) times daily.   furosemide (LASIX) 20 MG tablet Take 1 tablet (20 mg total) by mouth daily.   sertraline (ZOLOFT) 100 MG tablet Take 100 mg by mouth in the morning and at bedtime.   spironolactone (ALDACTONE) 25 MG tablet Take 1 tablet (25 mg total) by mouth daily. HF FUND 10/27/2017     Review of Systems negative except from HPI and PMH  Physical Exam BP (!) 172/101   Pulse 68   Ht 5\' 1"  (1.549 m)   Wt  72.6 kg   SpO2 98%   BMI 30.23 kg/m  Well developed and well nourished in no acute distress HENT normal E scleral and icterus clear Neck Supple Clear to ausculation Regular rate and rhythm, no murmurs gallops or rub Soft with active bowel sounds No clubbing cyanosis  Edema Alert and oriented, grossly normal motor and sensory function Skin Warm and Dry  Assessment and  Plan  NICM with interval EF normalization   CHF chronic diastolic/ systolic   CRT-ICD  Medtronic approaching ERI   High Risk Medication Surveillance Aldactone   Hypertension   Will give am meds for BP  Will proceed w ICD gen change.  We have reviewed the benefits and risks of generator replacement.  These include but are not limited to lead fracture and infection.  The patient understands, agrees and is willing to proceed.

## 2021-01-27 ENCOUNTER — Encounter (HOSPITAL_COMMUNITY): Payer: Self-pay | Admitting: Internal Medicine

## 2021-01-27 MED FILL — Cefazolin Sodium-Dextrose IV Solution 2 GM/100ML-4%: INTRAVENOUS | Qty: 100 | Status: AC

## 2021-01-28 ENCOUNTER — Telehealth: Payer: Self-pay | Admitting: Internal Medicine

## 2021-01-28 NOTE — Telephone Encounter (Signed)
Patient calling for a work note stating she can do light work for 2 weeks or so.

## 2021-01-30 DIAGNOSIS — Z0279 Encounter for issue of other medical certificate: Secondary | ICD-10-CM

## 2021-01-30 NOTE — Telephone Encounter (Signed)
Attempted phone call to pt.  She states that she is at work and requests RN call back.  Dr Caryl Comes approves work note for 1 week of light duty.  Work note sent to pt through [employer] and hardcopy placed in the mail for pt.

## 2021-01-31 ENCOUNTER — Telehealth: Payer: Self-pay | Admitting: Internal Medicine

## 2021-01-31 NOTE — Telephone Encounter (Signed)
Patient came in to drop off disability forms, make payment, and sign authorization. HIM received the form. Form has been placed in Dr. Olin Pia box to be completed. AO 01/31/21

## 2021-02-04 ENCOUNTER — Ambulatory Visit (INDEPENDENT_AMBULATORY_CARE_PROVIDER_SITE_OTHER): Payer: BC Managed Care – PPO | Admitting: Emergency Medicine

## 2021-02-04 ENCOUNTER — Other Ambulatory Visit: Payer: Self-pay

## 2021-02-04 DIAGNOSIS — I429 Cardiomyopathy, unspecified: Secondary | ICD-10-CM | POA: Diagnosis not present

## 2021-02-04 DIAGNOSIS — Z95810 Presence of automatic (implantable) cardiac defibrillator: Secondary | ICD-10-CM

## 2021-02-04 DIAGNOSIS — I428 Other cardiomyopathies: Secondary | ICD-10-CM | POA: Diagnosis not present

## 2021-02-04 LAB — CUP PACEART INCLINIC DEVICE CHECK
Battery Remaining Longevity: 72 mo
Battery Voltage: 3.07 V
Brady Statistic AP VP Percent: 0.09 %
Brady Statistic AP VS Percent: 0.02 %
Brady Statistic AS VP Percent: 99.86 %
Brady Statistic AS VS Percent: 0.04 %
Brady Statistic RA Percent Paced: 0.1 %
Brady Statistic RV Percent Paced: 95.97 %
Date Time Interrogation Session: 20220531143054
HighPow Impedance: 53 Ohm
Implantable Lead Implant Date: 20170206
Implantable Lead Implant Date: 20170206
Implantable Lead Implant Date: 20170206
Implantable Lead Location: 753858
Implantable Lead Location: 753859
Implantable Lead Location: 753860
Implantable Lead Model: 4398
Implantable Lead Model: 5076
Implantable Pulse Generator Implant Date: 20220520
Lead Channel Impedance Value: 1026 Ohm
Lead Channel Impedance Value: 1026 Ohm
Lead Channel Impedance Value: 1140 Ohm
Lead Channel Impedance Value: 1197 Ohm
Lead Channel Impedance Value: 1292 Ohm
Lead Channel Impedance Value: 267.31 Ohm
Lead Channel Impedance Value: 276.59 Ohm
Lead Channel Impedance Value: 279.484
Lead Channel Impedance Value: 336.648
Lead Channel Impedance Value: 356.187
Lead Channel Impedance Value: 418 Ohm
Lead Channel Impedance Value: 456 Ohm
Lead Channel Impedance Value: 551 Ohm
Lead Channel Impedance Value: 608 Ohm
Lead Channel Impedance Value: 646 Ohm
Lead Channel Impedance Value: 703 Ohm
Lead Channel Impedance Value: 722 Ohm
Lead Channel Impedance Value: 950 Ohm
Lead Channel Pacing Threshold Amplitude: 0.5 V
Lead Channel Pacing Threshold Amplitude: 0.5 V
Lead Channel Pacing Threshold Amplitude: 3 V
Lead Channel Pacing Threshold Pulse Width: 0.4 ms
Lead Channel Pacing Threshold Pulse Width: 0.4 ms
Lead Channel Pacing Threshold Pulse Width: 1 ms
Lead Channel Sensing Intrinsic Amplitude: 0.875 mV
Lead Channel Sensing Intrinsic Amplitude: 11.5 mV
Lead Channel Setting Pacing Amplitude: 1.5 V
Lead Channel Setting Pacing Amplitude: 2 V
Lead Channel Setting Pacing Amplitude: 3.25 V
Lead Channel Setting Pacing Pulse Width: 0.4 ms
Lead Channel Setting Pacing Pulse Width: 1 ms
Lead Channel Setting Sensing Sensitivity: 0.3 mV

## 2021-02-04 NOTE — Progress Notes (Signed)
Wound check appointment. Dermabond removed. Small amount of bleeding with dermabond removal from distal end of incision site, wound edges approximated, band-aid applied. Wound without redness or edema. Incision edges approximated, wound well healed. Normal device function. Thresholds, sensing, and impedances consistent with implant measurements. Device programmed at chronic settings due to mature leads. Histogram distribution appropriate for patient and level of activity. No mode switches or ventricular arrhythmias noted. Patient educated about wound care, arm mobility, lifting restrictions, shock plan. ROV with Dr Caryl Comes 04/29/21. Enrolled in remote follow-up and next remote scheduled 04/25/21. Shock plan reviewed.

## 2021-02-04 NOTE — Telephone Encounter (Signed)
HIM received Form back. Called patient and left a message to inform the patient her form was ready for pick up as indicated on the ROI. Form was placed up at check-in in an envelope. AO 02/04/21

## 2021-02-13 NOTE — Progress Notes (Signed)
Cardiology Office Note  Date: 02/14/2021   ID: Mary Pratt, DOB 08/11/1971, MRN [id number]  PCP:  Caryl Bis, MD  Cardiologist:  Carlyle Dolly, MD Electrophysiologist:  None   Chief Complaint:   History of Present Illness: Mary Pratt is a 50 y.o. female with a history of NICM, HFrEF,CRT-D (Medtronic CRT-D 10/14/2015 Dr Caryl Comes), Chronic HFrEF.  Echo 02/2015 EF 20% severe LV dilation. Cath: normal coronaries.  Echo 08/2015 EF 30%. Septal-lateral dyssynchrony. Medtronic CRT-D placed 10/2015. Echo 02/2017 EF 55-60% .  Last saw [name] PA-C 12/15/2019: She was doing well. No CP, No SOB and minimal DOE, No presyncope or syncope.  02/29/2020 Remote Device Check Scheduled remote reviewed. Normal device function.    The device estimates 11 months until ERI, sent to triage Next remote 91 days.    She presented with no particular complaints.  He denied any anginal or exertional symptoms, palpitations or arrhythmias, orthostatic symptoms, CVA or TIA-like symptoms, PND or orthopnea, claudication-like symptoms, DVT or PE-like symptoms, or lower extremity edema.  EKG showed ventricular paced rhythm with a rate of 75.  Last echocardiogram showed ejection fraction improved with EF of 60 to 65%.  She was  seen via telemedicine visit with Dr Caryl Comes on 12/12/2020. She had been doing well with some mild SOB and primary complaint of fatigue.  She was having some edema. Her ICD was nearing ERI. On 01/24/2021 She received ICD generator change.  She is here today for 1 year follow-up.  She states she is doing well.  She is having no heart failure symptoms.  Denies any DOE, SOB, lower extremity edema.  She denies any palpitations or arrhythmias.  No ICD shocks.  Recent generator change on 01/24/2021 without any complications.  She has had subsequent ICD remote device checks with no issues.  Recent in clinic device check 02/04/2021 demonstrated normal device function, thresholds, sensing, impedances  consistent with implant measurements.  Device programmed at chronic settings due to material leads.  Histogram distribution appropriate for patient and level activity.  No motor sutures of ventricular arrhythmias noted.  She continues on Entresto 24/26 mg p.o. twice daily.  Lasix 20 mg daily, carvedilol 25 mg p.o. twice daily.  Spironolactone 25 mg daily.   Past Medical History:  Diagnosis Date   Abnormal pap 9/13   ASCUS + HPV, 11/14 LGSIL   Anxiety    Asthma    Cardiac defibrillator in place    Chronic systolic CHF (congestive heart failure) ([facility])    a. 02/2015 Echo: EF 20%, sev dil w/ diff HK, worse @ inf base. Tiv AI, mild MR, mod dil LA, PASP 85mmHg.   Functional dyspepsia    early satiety    GERD with stricture    dilated 1638   HELICOBACTER PYLORI GASTRITIS 01/15/2009   dyspnea, ? reaction to Biaxin/amoxicillin  Pylera 01/15/09 - completed therapy      NICM (nonischemic cardiomyopathy) ([place])    a. 02/2015 Cath: nl cors, EF 15%, mild PAH;  b. 02/2015 Enrolled in VEST trial.   Sickle cell trait ([place])    Situational depression    "son died"    Past Surgical History:  Procedure Laterality Date   BIV ICD GENERATOR CHANGEOUT N/A 01/24/2021   Procedure: BIV ICD GENERATOR CHANGEOUT;  Surgeon: Deboraha Sprang, MD;  Location: [facility];  Service: Cardiovascular;  Laterality: N/A;   CARDIAC CATHETERIZATION N/A 02/26/2015   Procedure: Right/Left Heart Cath and Coronary Angiography;  Surgeon: Marcello Moores  Floyce Stakes, MD;  Location: [facility];  Service: Cardiovascular;  Laterality: N/A;   COLPOSCOPY  2013   neg   EP IMPLANTABLE DEVICE N/A 10/14/2015   Procedure: BiV ICD Insertion CRT-D;  Surgeon: Deboraha Sprang, MD;  Location: [facility];  Service: Cardiovascular;  Laterality: N/A;   ESOPHAGOGASTRODUODENOSCOPY (EGD) WITH ESOPHAGEAL DILATION  11/30/2008   esophageal ring dilated 54 French, H. pylori gastritis   TUBAL LIGATION  1991   WISDOM TOOTH EXTRACTION      Current  Outpatient Medications  Medication Sig Dispense Refill   albuterol (PROVENTIL HFA;VENTOLIN HFA) 108 (90 BASE) MCG/ACT inhaler Inhale into the lungs every 6 (six) hours as needed for wheezing or shortness of breath.     azelastine (ASTELIN) 0.1 % nasal spray Place 2 sprays into both nostrils 2 (two) times daily. Use in each nostril as directed     carvedilol (COREG) 25 MG tablet Take 1 tablet (25 mg total) by mouth 2 (two) times daily. HF FUND 10/27/2017 60 tablet 11   dexlansoprazole (DEXILANT) 60 MG capsule Take 60 mg by mouth daily.     fexofenadine (ALLEGRA) 180 MG tablet Take 180 mg by mouth 2 (two) times daily.      Fluticasone-Salmeterol (ADVAIR DISKUS) 250-50 MCG/DOSE AEPB Inhale 1 puff into the lungs 2 (two) times daily. 180 each 4   furosemide (LASIX) 20 MG tablet Take 1 tablet (20 mg total) by mouth daily. 30 tablet 11   Omega-3 Fatty Acids (FISH OIL) 1000 MG CAPS Take 2 capsules (2,000 mg total) by mouth 2 (two) times daily. 120 capsule 3   sertraline (ZOLOFT) 100 MG tablet Take 100 mg by mouth in the morning and at bedtime.     spironolactone (ALDACTONE) 25 MG tablet Take 1 tablet (25 mg total) by mouth daily. HF FUND 10/27/2017 30 tablet 11   magnesium oxide (MAG-OX) 400 (241.3 Mg) MG tablet TAKE ONE TABLET BY MOUTH ONCE DAILY (Patient not taking: Reported on 02/14/2021) 30 tablet 6   potassium chloride SA (K-DUR,KLOR-CON) 20 MEQ tablet Take 1 tablet (20 mEq total) by mouth 2 (two) times daily. (Patient not taking: Reported on 02/14/2021) 60 tablet 6   sacubitril-valsartan (ENTRESTO) 24-26 MG Take 1 tablet by mouth 2 (two) times daily. 60 tablet 6   No current facility-administered medications for this visit.   Allergies:  Amoxicillin and Clarithromycin   Social History: The patient  reports that she has never smoked. She has never used smokeless tobacco. She reports that she does not drink alcohol and does not use drugs.   Family History: The patient's family history includes Brain  cancer in her maternal uncle; Breast cancer in her maternal aunt; Diabetes in her mother; Hypertension in her mother; Hypothyroidism in her mother; Lung cancer in her maternal grandmother.   ROS:  Please see the history of present illness. Otherwise, complete review of systems is positive for none.  All other systems are reviewed and negative.   Physical Exam: VS:  BP 126/84   Pulse 74   Ht 5\' 1"  (1.549 m)   Wt 163 lb 6.4 oz (74.1 kg)   SpO2 97%   BMI 30.87 kg/m , BMI Body mass index is 30.87 kg/m.  Wt Readings from Last 3 Encounters:  02/14/21 163 lb 6.4 oz (74.1 kg)  01/24/21 160 lb (72.6 kg)  12/12/20 158 lb (71.7 kg)    General: Patient appears comfortable at rest. Neck: Supple, no elevated JVP or carotid bruits, no  thyromegaly. Lungs: Clear to auscultation, nonlabored breathing at rest. Cardiac: Regular rate and rhythm, no S3 or significant systolic murmur, no pericardial rub. Extremities: No pitting edema, distal pulses 2+. Skin: Warm and dry. Musculoskeletal: No kyphosis. Neuropsychiatric: Alert and oriented x3, affect grossly appropriate.  ECG:  An ECG dated 04/01/2020 was personally reviewed today and demonstrated:  Electronic ventricular pacemaker rate of 75 bpm  Recent Labwork: 01/22/2021: BUN 13; Creatinine, Ser 0.98; Hemoglobin 12.9; Platelets 236; Potassium 3.6; Sodium 139     Component Value Date/Time   CHOL 165 10/27/2017 1529   TRIG 302 (H) 10/27/2017 1529   HDL 43 10/27/2017 1529   CHOLHDL 3.8 10/27/2017 1529   VLDL 60 (H) 10/27/2017 1529   LDLCALC 62 10/27/2017 1529    Other Studies Reviewed Today:   08/01/2018: TTE Study Conclusions - Left ventricle: Global longitudinal strain is -17.6% The cavity    size was normal. Wall thickness was normal. Systolic function was    normal. The estimated ejection fraction was in the range of 60%    to 65%. Left ventricular diastolic function parameters were    normal.  - Pulmonary arteries: PA peak pressure: 31  mm Hg (S).  - Pericardium, extracardiac: A trivial pericardial effusion was    identified.    05/20/2015: CPX Conclusion: Exercise testing with gas-exchange demonstrates a  moderate functional impairment when compared to matched sedentary  norms. The patient appears to be circulatory limited primarily  due to HF with blunted HR, BP and O2 response to the exercise.        02/26/2015: R/LHC There is severe left ventricular systolic dysfunction. Very mild elevation of right sided heart pressures Central aortic oxygen saturation 98%; pulmonary artery oxygen saturation 65%.   Dilated severe nonischemic cardiomyopathy with an ejection fraction of 15%. Normal coronary arteries. Mild pulmonary hypertension.   RECOMMENDATION: Aggressive medical therapy for her nonischemic cardiomyopathy with medication titration over the next several months.  Consider interim LifeVest, pending follow-up LV function assessment in several months.      Assessment and Plan:  1. Chronic systolic heart failure ([place])   2. Nonischemic cardiomyopathy ([place])   3. ICD (implantable cardioverter-defibrillator), biventricular, in situ     1. Chronic systolic heart failure ([place]) Last echocardiogram in 2019 showed return of EF back to 60 to 65%.  Denies any dyspnea on exertion,  or lower extremity edema.  Continue Coreg 25 mg p.o. twice daily.  Lasix 20 mg daily.  Entresto 24/26 mg p.o. twice daily.  Spironolactone 25 mg p.o. daily.  Please refill Entresto.  2. Nonischemic cardiomyopathy ([place]) As mentioned above EF has returned to normal with recent EF 60 to 65% in 2019.  No symptoms  3. AICD (automatic cardioverter/defibrillator) present Recent ICD generator change by Dr. Caryl Comes on 01/24/2021.  No complications from insertion.  Recent device check in clinic showed normal device function.  Medication Adjustments/Labs and Tests Ordered: Current medicines are reviewed at length with the patient today.  Concerns regarding  medicines are outlined above.   Disposition: Follow-up with Dr. Harl Bowie or APP 6 months  Signed, Levell July, NP 02/14/2021 3:37 PM    [facility] [place], [place], [place] [address] Phone: [phone]; Fax: [phone]

## 2021-02-14 ENCOUNTER — Other Ambulatory Visit: Payer: Self-pay

## 2021-02-14 ENCOUNTER — Encounter: Payer: Self-pay | Admitting: Family Medicine

## 2021-02-14 ENCOUNTER — Ambulatory Visit: Payer: BC Managed Care – PPO | Admitting: Family Medicine

## 2021-02-14 VITALS — BP 126/84 | HR 74 | Ht 61.0 in | Wt 163.4 lb

## 2021-02-14 DIAGNOSIS — Z95810 Presence of automatic (implantable) cardiac defibrillator: Secondary | ICD-10-CM

## 2021-02-14 DIAGNOSIS — I428 Other cardiomyopathies: Secondary | ICD-10-CM

## 2021-02-14 DIAGNOSIS — I5022 Chronic systolic (congestive) heart failure: Secondary | ICD-10-CM

## 2021-02-14 MED ORDER — SACUBITRIL-VALSARTAN 24-26 MG PO TABS: 1.0000 | ORAL_TABLET | Freq: Two times a day (BID) | ORAL | 6 refills | Status: DC

## 2021-02-14 NOTE — Patient Instructions (Signed)

## 2021-02-26 ENCOUNTER — Telehealth: Payer: Self-pay | Admitting: Family Medicine

## 2021-02-26 ENCOUNTER — Telehealth: Payer: Self-pay | Admitting: Internal Medicine

## 2021-02-26 NOTE — Telephone Encounter (Signed)
NEW MESSAGE    PATIENT WENT TO PICK UP HER ENTRESTO AND IT WAS $170 , DO YOU HAVE A COUPON SHE CAN USE SHE CAN NOT AFFORD THAT

## 2021-02-26 NOTE — Telephone Encounter (Signed)
Follow up:     Patient calling to see if her FMLA papers can be faxed or email to her. Patient dropped forms off 01/2021. A note states the forms was left at the front. Patient do not tile 5:30 pm. Please call patient.

## 2021-02-26 NOTE — Telephone Encounter (Signed)
Advise that $10 copay card will be left upfront for pick up.

## 2021-04-07 ENCOUNTER — Other Ambulatory Visit: Payer: Self-pay | Admitting: Cardiology

## 2021-04-07 DIAGNOSIS — I5022 Chronic systolic (congestive) heart failure: Secondary | ICD-10-CM

## 2021-04-11 ENCOUNTER — Other Ambulatory Visit: Payer: Self-pay | Admitting: Cardiology

## 2021-04-11 DIAGNOSIS — I5022 Chronic systolic (congestive) heart failure: Secondary | ICD-10-CM

## 2021-04-25 ENCOUNTER — Ambulatory Visit (INDEPENDENT_AMBULATORY_CARE_PROVIDER_SITE_OTHER): Payer: BC Managed Care – PPO

## 2021-04-25 DIAGNOSIS — I5022 Chronic systolic (congestive) heart failure: Secondary | ICD-10-CM

## 2021-04-25 DIAGNOSIS — I428 Other cardiomyopathies: Secondary | ICD-10-CM

## 2021-04-25 LAB — CUP PACEART REMOTE DEVICE CHECK
Battery Remaining Longevity: 69 mo
Battery Voltage: 3.02 V
Brady Statistic AP VP Percent: 0.06 %
Brady Statistic AP VS Percent: 0.01 %
Brady Statistic AS VP Percent: 99.89 %
Brady Statistic AS VS Percent: 0.03 %
Brady Statistic RA Percent Paced: 0.08 %
Brady Statistic RV Percent Paced: 96.3 %
Date Time Interrogation Session: 20220819001706
HighPow Impedance: 57 Ohm
Implantable Lead Implant Date: 20170206
Implantable Lead Implant Date: 20170206
Implantable Lead Implant Date: 20170206
Implantable Lead Location: 753858
Implantable Lead Location: 753859
Implantable Lead Location: 753860
Implantable Lead Model: 4398
Implantable Lead Model: 5076
Implantable Pulse Generator Implant Date: 20220520
Lead Channel Impedance Value: 1026 Ohm
Lead Channel Impedance Value: 1121 Ohm
Lead Channel Impedance Value: 1235 Ohm
Lead Channel Impedance Value: 1254 Ohm
Lead Channel Impedance Value: 266.667
Lead Channel Impedance Value: 283.468
Lead Channel Impedance Value: 292.308
Lead Channel Impedance Value: 337.778
Lead Channel Impedance Value: 365.195
Lead Channel Impedance Value: 418 Ohm
Lead Channel Impedance Value: 475 Ohm
Lead Channel Impedance Value: 532 Ohm
Lead Channel Impedance Value: 589 Ohm
Lead Channel Impedance Value: 608 Ohm
Lead Channel Impedance Value: 703 Ohm
Lead Channel Impedance Value: 760 Ohm
Lead Channel Impedance Value: 874 Ohm
Lead Channel Impedance Value: 950 Ohm
Lead Channel Pacing Threshold Amplitude: 0.375 V
Lead Channel Pacing Threshold Amplitude: 0.5 V
Lead Channel Pacing Threshold Amplitude: 3 V
Lead Channel Pacing Threshold Pulse Width: 0.4 ms
Lead Channel Pacing Threshold Pulse Width: 0.4 ms
Lead Channel Pacing Threshold Pulse Width: 1 ms
Lead Channel Sensing Intrinsic Amplitude: 0.625 mV
Lead Channel Sensing Intrinsic Amplitude: 0.625 mV
Lead Channel Sensing Intrinsic Amplitude: 10.625 mV
Lead Channel Sensing Intrinsic Amplitude: 10.625 mV
Lead Channel Setting Pacing Amplitude: 1.5 V
Lead Channel Setting Pacing Amplitude: 2 V
Lead Channel Setting Pacing Amplitude: 3.25 V
Lead Channel Setting Pacing Pulse Width: 0.4 ms
Lead Channel Setting Pacing Pulse Width: 1 ms
Lead Channel Setting Sensing Sensitivity: 0.3 mV

## 2021-04-29 ENCOUNTER — Encounter: Payer: Medicaid Other | Admitting: Internal Medicine

## 2021-04-29 ENCOUNTER — Encounter: Payer: Self-pay | Admitting: Internal Medicine

## 2021-04-29 ENCOUNTER — Ambulatory Visit (INDEPENDENT_AMBULATORY_CARE_PROVIDER_SITE_OTHER): Payer: BC Managed Care – PPO | Admitting: Internal Medicine

## 2021-04-29 VITALS — BP 106/72 | HR 72 | Ht 60.25 in | Wt 162.1 lb

## 2021-04-29 DIAGNOSIS — E8881 Metabolic syndrome: Secondary | ICD-10-CM

## 2021-04-29 DIAGNOSIS — K760 Fatty (change of) liver, not elsewhere classified: Secondary | ICD-10-CM

## 2021-04-29 DIAGNOSIS — E669 Obesity, unspecified: Secondary | ICD-10-CM

## 2021-04-29 DIAGNOSIS — R1010 Upper abdominal pain, unspecified: Secondary | ICD-10-CM

## 2021-04-29 DIAGNOSIS — R197 Diarrhea, unspecified: Secondary | ICD-10-CM | POA: Diagnosis not present

## 2021-04-29 DIAGNOSIS — Z1211 Encounter for screening for malignant neoplasm of colon: Secondary | ICD-10-CM | POA: Diagnosis not present

## 2021-04-29 NOTE — Progress Notes (Addendum)
Mary Pratt 50 y.o. [birth year] [id number] Referred by: Caryl Bis, MD  Assessment & Plan:   Encounter Diagnoses  Name Primary?   Upper abdominal pain despite PPI Yes   Diarrhea - intermittent, related to dairy and fried foods    Colon cancer screening    Hepatic steatosis    Abdominal obesity and metabolic syndrome    Evaluate upper abdominal pain with EGD.  Colonoscopy for screening.  I think the diarrhea is related to foods and possible lactose intolerant.  The risks and benefits as well as alternatives of endoscopic procedure(s) have been discussed and reviewed. All questions answered. The patient agrees to proceed.   Request records of labs from primary care performed this year  Reduce processed foods especially carbohydrates.  We had a conversation and some handouts were given for that.  I appreciate the opportunity to care for this patient. CC: Caryl Bis, MD  Labs drawn March 26, 2021, CBC normal.  CMET glucose 122 otherwise normal lipase normal H. pylori IgG antibodies negative Subjective:   Chief Complaint: Abdominal pain bloating diarrhea nausea  HPI 50 year old African-American woman last seen by me in 2016 with dyspnea on exertion, she has a background GI history of GERD with stricture and H. pylori gastritis.  At that time there was concern that perhaps her reflux was driving asthma and making her dyspneic.  Work-up that I performed showed CHF by echocardiogram as well as elevated BNP.  She was referred to cardiology at that time.  She now is followed by cardiology and has a nonischemic cardiomyopathy and has a defibrillator in place.  Echocardiogram in 2019 with EF 60 to 65%.    Last EGD in 2013 without stricture treated dysphagia with empiric Maloney dilation and it was otherwise normal-appearing.  No prior colonoscopy.  She presents today with complaints of right lower quadrant epigastric and upper abdominal pain and soreness and intermittent  diarrhea for the past month as well as bloating and nausea.  A complete abdominal ultrasound was performed on 04/07/2021 through [facility] and showed simple hepatic and renal cysts and changes consistent with hepatic steatosis.  On Dexilant but still having the symptoms but no dysphagia anymore like in the past.  She is describing a constant right upper quadrant/flank pain.  She also has intermittent upper abdominal pain sometimes after eating and if she eats greasy or fried foods or milk products she will have some diarrhea.  She admits to eating a lot of fast food and processed foods.  Does not really drink sodas but will drink some V8 juice and also 2 to 3 glasses of sweet tea a day.  She says she is busy.  She acknowledges that she could take time to meal prep.  She works more than 40 hours a week on the first shift at [employer] are busy there.  No rectal bleeding.  Weight has risen.  Sometimes she has nausea and loss of appetite not reported above.  There has been no injury to the abdominal wall or chest wall.  Wt Readings from Last 3 Encounters:  04/29/21 162 lb 2 oz (73.5 kg)  02/14/21 163 lb 6.4 oz (74.1 kg)  01/24/21 160 lb (72.6 kg)       Allergies  Allergen Reactions   Amoxicillin Other (See Comments)    REACTION: Tacycardia   Clarithromycin Hives    Breaks out and gets whelps   Spiriva Respimat [Tiotropium Bromide Monohydrate] Rash   Current Meds  Medication Sig   albuterol (PROVENTIL HFA;VENTOLIN HFA) 108 (90 BASE) MCG/ACT inhaler Inhale into the lungs every 6 (six) hours as needed for wheezing or shortness of breath.   azelastine (ASTELIN) 0.1 % nasal spray Place 2 sprays into both nostrils 2 (two) times daily. Use in each nostril as directed   carvedilol (COREG) 25 MG tablet TAKE 1 TABLET BY MOUTH TWICE A DAY   dexlansoprazole (DEXILANT) 60 MG capsule Take 60 mg by mouth daily.   fexofenadine (ALLEGRA) 180 MG tablet Take 180 mg by mouth 2 (two) times daily.     Fluticasone-Salmeterol (ADVAIR DISKUS) 250-50 MCG/DOSE AEPB Inhale 1 puff into the lungs 2 (two) times daily.   furosemide (LASIX) 20 MG tablet TAKE 1 TABLET BY MOUTH EVERY DAY   sacubitril-valsartan (ENTRESTO) 24-26 MG Take 1 tablet by mouth 2 (two) times daily.   sertraline (ZOLOFT) 100 MG tablet Take 100 mg by mouth in the morning and at bedtime.   spironolactone (ALDACTONE) 25 MG tablet TAKE 1 TABLET BY MOUTH EVERY DAY   Past Medical History:  Diagnosis Date   Abnormal pap 9/13   ASCUS + HPV, 11/14 LGSIL   Anxiety    Asthma    Cardiac defibrillator in place    Chronic systolic CHF (congestive heart failure) ([place])    a. 02/2015 Echo: EF 20%, sev dil w/ diff HK, worse @ inf base. Tiv AI, mild MR, mod dil LA, PASP 105mHg.   Functional dyspepsia    early satiety    GERD with stricture    dilated [id number]  HELICOBACTER PYLORI GASTRITIS 01/15/2009   dyspnea, ? reaction to Biaxin/amoxicillin  Pylera 01/15/09 - completed therapy      NICM ([place]ischemic cardiomyopathy) ([place]    a. 02/2015 Cath: nl cors, EF 15%, mild PAH;  b. 02/2015 Enrolled in VEST trial.   [place]    Situational depression    "son died"   Past Surgical History:  Procedure Laterality Date   BIV ICD GENERATOR CHANGEOUT N/A 01/24/2021   Procedure: BIV ICD GENERATOR CHANGEOUT;  Surgeon: KDeboraha Sprang MD;  [facility];  Service: Cardiovascular;  Laterality: N/A;   CARDIAC CATHETERIZATION N/A 02/26/2015   Procedure: Right/Left Heart Cath and Coronary Angiography;  Surgeon: TTroy Sine MD;  [facility];  Service: Cardiovascular;  Laterality: N/A;   COLPOSCOPY  2013   neg   EP IMPLANTABLE DEVICE N/A 10/14/2015   Procedure: BiV ICD Insertion CRT-D;  Surgeon: SDeboraha Sprang MD;  [facility];  Service: Cardiovascular;  Laterality: N/A;   ESOPHAGOGASTRODUODENOSCOPY (EGD) WITH ESOPHAGEAL DILATION  11/30/2008   esophageal ring dilated 566French, H. pylori gastritis    TUBAL LIGATION  1991   WISDOM TOOTH EXTRACTION     Social History   Social History Narrative   Single, employed in [employer], educationalat the [employer]   2 sons 1 daughter 1 son deceased   Drinks caffeine up to a few a day, never smoker no drug use   family history includes Brain cancer in her maternal uncle; Breast cancer in her maternal aunt; Diabetes in her mother; Hypertension in her mother; Hypothyroidism in her mother; Lung cancer in her maternal grandmother; Prostate cancer in her paternal uncle.   Review of Systems Allergies anxiety depressed mood and fatigue.  Insomnia and she is very thirsty at times.  All other review of systems negative or as per HPI  Objective:   Physical Exam @  BP 106/72 (BP Location: Left Arm, Patient Position: Sitting, Cuff Size: Normal)   Pulse 72   Ht 5' 0.25" (1.53 m) Comment: height measured without shoes  Wt 162 lb 2 oz (73.5 kg)   LMP 10/23/2020   BMI 31.40 kg/m @  General:  Well-developed, well-nourished and in no acute distress Eyes:  anicteric. Lungs: Clear to auscultation bilaterally. Heart:  S1S2, no rubs, murmurs, gallops. Abdomen:  Obese soft, mildly diffuselytender, no hepatosplenomegaly, hernia, or mass and BS+. Neg [place] Rectal: deferred Lymph:  no cervical or supraclavicular adenopathy. Extremities:   no edema, cyanosis or clubbing Neuro:  A&O x 3.  Psych:  appropriate mood and  Affect.   Data Reviewed: See HPI I have reviewed cardiology notes as well as former gastroenterology work-up and the ultrasound from this month

## 2021-04-29 NOTE — Patient Instructions (Addendum)
You have been scheduled for an endoscopy and colonoscopy. Please follow the written instructions given to you at your visit today. Please pick up your prep supplies at the pharmacy within the next 1-3 days. If you use inhalers (even only as needed), please bring them with you on the day of your procedure.  Due to recent changes in healthcare laws, you may see the results of your imaging and laboratory studies on MyChart before your provider has had a chance to review them.  We understand that in some cases there may be results that are confusing or concerning to you. Not all laboratory results come back in the same time frame and the provider may be waiting for multiple results in order to interpret others.  Please give [place] 48 hours in order for your provider to thoroughly review all the results before contacting the office for clarification of your results.    We are giving you information to read and follow on Lactose intolerance.   We will request your records from Dr Gar Ponto.  I appreciate the opportunity to care for you. Silvano Rusk, MD, [facility]

## 2021-05-10 NOTE — Progress Notes (Signed)
Remote ICD transmission.   

## 2021-05-22 ENCOUNTER — Other Ambulatory Visit: Payer: Self-pay

## 2021-05-22 ENCOUNTER — Encounter: Payer: Self-pay | Admitting: Internal Medicine

## 2021-05-22 ENCOUNTER — Telehealth: Payer: Self-pay

## 2021-05-22 ENCOUNTER — Ambulatory Visit (AMBULATORY_SURGERY_CENTER): Payer: BC Managed Care – PPO | Admitting: Internal Medicine

## 2021-05-22 VITALS — BP 125/77 | HR 67 | Temp 97.5°F | Resp 16 | Ht 60.0 in | Wt 162.0 lb

## 2021-05-22 DIAGNOSIS — Z1211 Encounter for screening for malignant neoplasm of colon: Secondary | ICD-10-CM | POA: Diagnosis not present

## 2021-05-22 DIAGNOSIS — R1010 Upper abdominal pain, unspecified: Secondary | ICD-10-CM | POA: Diagnosis not present

## 2021-05-22 DIAGNOSIS — D250 Submucous leiomyoma of uterus: Secondary | ICD-10-CM

## 2021-05-22 MED ORDER — SODIUM CHLORIDE 0.9 % IV SOLN
500.0000 mL | INTRAVENOUS | Status: DC
Start: 2021-05-22 — End: 2021-05-22

## 2021-05-22 MED ORDER — SUCRALFATE 1 G PO TABS: 1.0000 g | ORAL_TABLET | Freq: Three times a day (TID) | ORAL | 0 refills | Status: DC

## 2021-05-22 NOTE — Progress Notes (Signed)
VS by CW  Pt's states no medical or surgical changes since previsit or office visit.  

## 2021-05-22 NOTE — Telephone Encounter (Signed)
-----   Message from Gatha Mayer, MD sent at 05/22/2021  9:49 AM EDT ----- Regarding: GYN referral Please refer to Dr. Talbert Nan or colleagues re: uterine fibroids

## 2021-05-22 NOTE — Op Note (Signed)
[facility] Patient Name: Mary Pratt Procedure Date: 05/22/2021 9:00 AM MRN: [id number] Endoscopist: Gatha Mayer , MD Age: 50 Referring MD:  Date of Birth: 08-29-1971 Gender: Female Account #: [id number] Procedure:                Upper GI endoscopy Indications:              Upper abdominal pain Medicines:                Propofol per Anesthesia, Monitored Anesthesia Care Procedure:                Pre-Anesthesia Assessment:                           - Prior to the procedure, a History and Physical                            was performed, and patient medications and                            allergies were reviewed. The patient's tolerance of                            previous anesthesia was also reviewed. The risks                            and benefits of the procedure and the sedation                            options and risks were discussed with the patient.                            All questions were answered, and informed consent                            was obtained. Prior Anticoagulants: The patient has                            taken no previous anticoagulant or antiplatelet                            agents. ASA Grade Assessment: III - A patient with                            severe systemic disease. After reviewing the risks                            and benefits, the patient was deemed in                            satisfactory condition to undergo the procedure.                           After obtaining informed consent, the endoscope was  passed under direct vision. Throughout the                            procedure, the patient's blood pressure, pulse, and                            oxygen saturations were monitored continuously. The                            [id number] was introduced through the                            mouth, and advanced to the second part of duodenum.                            The  upper GI endoscopy was accomplished without                            difficulty. The patient tolerated the procedure                            well. Scope In: Scope Out: Findings:                 The Z-line was irregular.                           The gastroesophageal flap valve was visualized                            endoscopically and classified as Hill Grade II                            (fold present, opens with respiration).                           The entire examined stomach was normal.                           The cardia and gastric fundus were normal on                            retroflexion.                           The examined duodenum was normal. Complications:            No immediate complications. Estimated Blood Loss:     Estimated blood loss: none. Impression:               - Z-line irregular.                           - Gastroesophageal flap valve classified as Hill                            Grade II (fold present, opens with respiration).                           -  Normal stomach.                           - Normal examined duodenum.                           - No specimens collected.                           I think fatty liver likely contributing to sxs Recommendation:           - Patient has a contact number available for                            emergencies. The signs and symptoms of potential                            delayed complications were discussed with the                            patient. Return to normal activities tomorrow.                            Written discharge instructions were provided to the                            patient.                           - Diet - reduce processed foods and sugary                            beverages indefinitely.                           - Continue present medications.                           - Use sucralfate tablets 1 gram PO QID for 1 month. Gatha Mayer, MD 05/22/2021 9:37:28 AM This  report has been signed electronically.

## 2021-05-22 NOTE — Progress Notes (Signed)
Vital signs entered manually as listed on the monitor. System is down. No values transferred automatically.

## 2021-05-22 NOTE — Progress Notes (Signed)
History and Physical Interval Note:  05/22/2021 9:04 AM  Mary Pratt  has presented today for endoscopic procedure(s), with the diagnosis of  Encounter Diagnoses  Name Primary?   Upper abdominal pain Yes   Colon cancer screening   .  The various methods of evaluation and treatment have been discussed with the patient and/or family. After consideration of risks, benefits and other options for treatment, the patient has consented to  the endoscopic procedure(s).   The patient's history has been reviewed, patient examined, no change in status, stable for endoscopic procedure(s).  I have reviewed the patient's chart and labs.  Questions were answered to the patient's satisfaction.     Gatha Mayer, MD, [facility]

## 2021-05-22 NOTE — Patient Instructions (Addendum)
I did not see any problems on these exams. Everything looks ok. I do not think anything bad is going on.  I think the fibroids in uterus could be part of the abdominal pain.  Here is what I recommend:  1) Make eating changes that we discussed - greatly reduced processed foods, sugary beverages   2) Take the following medication for 1 month - it coats the esophagus, stomach and intestine lining and can help with upper gastrointestinal symptoms. Meds ordered this encounter  Medications      sucralfate (CARAFATE) 1 g tablet    Sig: Take 1 tablet (1 g total) by mouth 4 (four) times daily -  with meals and at bedtime.    Dispense:  120 tablet    Refill:  0   3) If you do these things and still do not feel good enough come back to see me.  I appreciate the opportunity to care for you. Gatha Mayer, MD, [facility]  No repeat colonoscopy for 10 years for screening!   YOU HAD AN ENDOSCOPIC PROCEDURE TODAY AT [facility]:   Refer to the procedure report that was given to you for any specific questions about what was found during the examination.  If the procedure report does not answer your questions, please call your gastroenterologist to clarify.  If you requested that your care partner not be given the details of your procedure findings, then the procedure report has been included in a sealed envelope for you to review at your convenience later.  YOU SHOULD EXPECT: Some feelings of bloating in the abdomen. Passage of more gas than usual.  Walking can help get rid of the air that was put into your GI tract during the procedure and reduce the bloating. If you had a lower endoscopy (such as a colonoscopy or flexible sigmoidoscopy) you may notice spotting of blood in your stool or on the toilet paper. If you underwent a bowel prep for your procedure, you may not have a normal bowel movement for a few days.  Please Note:  You might notice some irritation and congestion in your nose or  some drainage.  This is from the oxygen used during your procedure.  There is no need for concern and it should clear up in a day or so.  SYMPTOMS TO REPORT IMMEDIATELY:  Following lower endoscopy (colonoscopy or flexible sigmoidoscopy):  Excessive amounts of blood in the stool  Significant tenderness or worsening of abdominal pains  Swelling of the abdomen that is new, acute  Fever of 100F or higher  Following upper endoscopy (EGD)  Vomiting of blood or coffee ground material  New chest pain or pain under the shoulder blades  Painful or persistently difficult swallowing  New shortness of breath  Fever of 100F or higher  Black, tarry-looking stools  For urgent or emergent issues, a gastroenterologist can be reached at any hour by calling [phone]. Do not use MyChart messaging for urgent concerns.    DIET:  We do recommend a small meal at first, but then you may proceed to your regular diet.  Drink plenty of fluids but you should avoid alcoholic beverages for 24 hours.  ACTIVITY:  You should plan to take it easy for the rest of today and you should NOT DRIVE or use heavy machinery until tomorrow (because of the sedation medicines used during the test).    FOLLOW UP: Our staff will call the number listed on your records 48-72 hours  following your procedure to check on you and address any questions or concerns that you may have regarding the information given to you following your procedure. If we do not reach you, we will leave a message.  We will attempt to reach you two times.  During this call, we will ask if you have developed any symptoms of COVID 19. If you develop any symptoms (ie: fever, flu-like symptoms, shortness of breath, cough etc.) before then, please call [phone].  If you test positive for Covid 19 in the 2 weeks post procedure, please call and report this information to [place].    If any biopsies were taken you will be contacted by phone or by letter within the  next 1-3 weeks.  Please call us at [phone] if you have not heard about the biopsies in 3 weeks.    SIGNATURES/CONFIDENTIALITY: You and/or your care partner have signed paperwork which will be entered into your electronic medical record.  These signatures attest to the fact that that the information above on your After Visit Summary has been reviewed and is understood.  Full responsibility of the confidentiality of this discharge information lies with you and/or your care-partner.

## 2021-05-22 NOTE — Progress Notes (Signed)
Pt in recovery with monitors in place, VSS. Report given to receiving RN. Bite guard was placed with pt awake to ensure comfort. No dental or soft tissue damage noted. RN will remove the guard when the pt is awake.  

## 2021-05-22 NOTE — Op Note (Addendum)
[facility] Patient Name: Mary Pratt Procedure Date: 05/22/2021 9:01 AM MRN: [id number] Endoscopist: Gatha Mayer , MD Age: 50 Referring MD:  Date of Birth: Feb 25, 1971 Gender: Female Account #: [id number] Procedure:                Colonoscopy Indications:              Screening for colorectal malignant neoplasm Medicines:                Propofol per Anesthesia, Monitored Anesthesia Care Procedure:                Pre-Anesthesia Assessment:                           - Prior to the procedure, a History and Physical                            was performed, and patient medications and                            allergies were reviewed. The patient's tolerance of                            previous anesthesia was also reviewed. The risks                            and benefits of the procedure and the sedation                            options and risks were discussed with the patient.                            All questions were answered, and informed consent                            was obtained. Prior Anticoagulants: The patient has                            taken no previous anticoagulant or antiplatelet                            agents. ASA Grade Assessment: III - A patient with                            severe systemic disease. After reviewing the risks                            and benefits, the patient was deemed in                            satisfactory condition to undergo the procedure.                           After obtaining informed consent, the colonoscope  was passed under direct vision. Throughout the                            procedure, the patient's blood pressure, pulse, and                            oxygen saturations were monitored continuously. The                            CF [id number] was introduced through the anus                            and advanced to the the terminal ileum, with                             identification of the appendiceal orifice and IC                            valve. The colonoscopy was performed without                            difficulty. The patient tolerated the procedure                            well. The quality of the bowel preparation was                            good. The terminal ileum, ileocecal valve,                            appendiceal orifice, and rectum were photographed.                            The bowel preparation used was Miralax via split                            dose instruction. Scope In: 9:16:31 AM Scope Out: 9:26:53 AM Scope Withdrawal Time: 0 hours 8 minutes 36 seconds  Total Procedure Duration: 0 hours 10 minutes 22 seconds  Findings:                 The perianal and digital rectal examinations were                            normal.                           The terminal ileum appeared normal.                           The entire examined colon appeared normal on direct                            and retroflexion views. Complications:            No immediate complications.  Estimated Blood Loss:     Estimated blood loss: none. Impression:               - The examined portion of the ileum was normal.                           - The entire examined colon is normal on direct and                            retroflexion views.                           - No specimens collected. Recommendation:           - Patient has a contact number available for                            emergencies. The signs and symptoms of potential                            delayed complications were discussed with the                            patient. Return to normal activities tomorrow.                            Written discharge instructions were provided to the                            patient.                           - Diet - reduce processed foods and sugary                            beverages indefinitely.                           -  Continue present medications.                           - Repeat colonoscopy in 10 years for screening                            purposes.                           - Follow-up with GYN re: fibroids which may be part                            of lower abdominal issues discussed in recovery -                            will refer to [place], MD 05/22/2021 9:40:12 AM This report has been signed electronically.

## 2021-05-22 NOTE — Telephone Encounter (Signed)
Referral placed to GYN 

## 2021-05-23 NOTE — Telephone Encounter (Signed)
Patient has been scheduled with GYN.  See appointment in [place] appointment desk

## 2021-05-26 ENCOUNTER — Telehealth: Payer: Self-pay | Admitting: *Deleted

## 2021-05-26 NOTE — Telephone Encounter (Signed)
Attempted to call patient for their post-procedure follow-up call. No answer. Left voicemail.   

## 2021-05-26 NOTE — Telephone Encounter (Signed)
  Follow up Call-  Call back number 05/22/2021  Post procedure Call Back phone  # [phone]  Permission to leave phone message Yes  Some recent data might be hidden     Patient questions:   Message left to call if necessary.

## 2021-06-04 ENCOUNTER — Encounter: Payer: Self-pay | Admitting: Obstetrics and Gynecology

## 2021-06-04 ENCOUNTER — Other Ambulatory Visit: Payer: Self-pay

## 2021-06-04 ENCOUNTER — Ambulatory Visit (INDEPENDENT_AMBULATORY_CARE_PROVIDER_SITE_OTHER): Payer: BC Managed Care – PPO | Admitting: Obstetrics and Gynecology

## 2021-06-04 VITALS — BP 110/78 | HR 88 | Ht 61.0 in | Wt 160.0 lb

## 2021-06-04 DIAGNOSIS — N926 Irregular menstruation, unspecified: Secondary | ICD-10-CM | POA: Diagnosis not present

## 2021-06-04 DIAGNOSIS — R1032 Left lower quadrant pain: Secondary | ICD-10-CM | POA: Diagnosis not present

## 2021-06-04 DIAGNOSIS — N951 Menopausal and female climacteric states: Secondary | ICD-10-CM

## 2021-06-04 DIAGNOSIS — N941 Unspecified dyspareunia: Secondary | ICD-10-CM

## 2021-06-04 LAB — TSH: TSH: 0.79 mIU/L

## 2021-06-04 LAB — FOLLICLE STIMULATING HORMONE: FSH: 50.5 m[IU]/mL

## 2021-06-04 NOTE — Patient Instructions (Signed)
Take 600 mg of ibuprofen (3 of the 200 mg advil tablets) one hour prior to your appointment.

## 2021-06-04 NOTE — Progress Notes (Signed)
50 y.o. [id number] Divorced Black or [place] American Not Hispanic or Latino female here for discussion about Fibroids.  05/16/21 pelvic ultrasound normal. Her endometrial stripe was 9 mm without focal masses.   Periods were monthly until the last 9-12 months. She went 6 months without a cycle, then had a cycle in 8/22 for 6 days. ~4 weeks later she had a cycle in 05/30/21 for 6 days. She can saturate a regular tampon in 2 hours. No dysmenorrhea. No vasomotor symptoms, hasn't for over a year.   Sexually active, h/o tubal ligation. She has some deep dyspareunia, just on occasion, positional.   She c/o a 6 month h/o intermittent LLQ abdominal pain. The pain is a dull ache, lasts for a couple of seconds, feels bloated. The pain occurs ~ 2-3 x a month.  She has intermittent constipation. She has some increase in gas. Mainly just bloated.   She also has epigastric pain.  Reviewed outside lab work, including CMP (with normal renal function) and CBC.     Patient's last menstrual period was 05/30/2021 (exact date).          Sexually active: Yes.    The current method of family planning is tubal ligation.    Exercising: No.  The patient does not participate in regular exercise at present. Smoker:  no  Health Maintenance: Pap: 04/2021 normal per patient, 08/16/14 ASCUS HPV +  History of abnormal Pap:  yes MMG:  08/03/14 Bi-rads 1 neg  BMD:   none  Colonoscopy: 05/22/21 F/u 10 years  TDaP:  05/2021 Gardasil: na   reports that she has never smoked. She has never used smokeless tobacco. She reports current alcohol use. She reports that she does not use drugs.  Past Medical History:  Diagnosis Date   Abnormal pap 9/13   ASCUS + HPV, 11/14 LGSIL   Anxiety    Asthma    Cardiac defibrillator in place    Chronic systolic CHF (congestive heart failure) ([place])    a. 02/2015 Echo: EF 20%, sev dil w/ diff HK, worse @ inf base. Tiv AI, mild MR, mod dil LA, PASP 51mmHg.   Functional dyspepsia    early  satiety    GERD with stricture    dilated 0100   HELICOBACTER PYLORI GASTRITIS 01/15/2009   dyspnea, ? reaction to Biaxin/amoxicillin  Pylera 01/15/09 - completed therapy      NICM (nonischemic cardiomyopathy) ([place])    a. 02/2015 Cath: nl cors, EF 15%, mild PAH;  b. 02/2015 Enrolled in VEST trial.   Sickle cell trait ([place])    Situational depression    "son died"    Past Surgical History:  Procedure Laterality Date   BIV ICD GENERATOR CHANGEOUT N/A 01/24/2021   Procedure: BIV ICD GENERATOR CHANGEOUT;  Surgeon: Deboraha Sprang, MD;  Location: [facility];  Service: Cardiovascular;  Laterality: N/A;   CARDIAC CATHETERIZATION N/A 02/26/2015   Procedure: Right/Left Heart Cath and Coronary Angiography;  Surgeon: Troy Sine, MD;  Location: [facility];  Service: Cardiovascular;  Laterality: N/A;   COLPOSCOPY  2013   neg   EP IMPLANTABLE DEVICE N/A 10/14/2015   Procedure: BiV ICD Insertion CRT-D;  Surgeon: Deboraha Sprang, MD;  Location: [facility];  Service: Cardiovascular;  Laterality: N/A;   ESOPHAGOGASTRODUODENOSCOPY (EGD) WITH ESOPHAGEAL DILATION  11/30/2008   esophageal ring dilated 37 French, H. pylori gastritis   TUBAL LIGATION  1991   WISDOM TOOTH EXTRACTION  Current Outpatient Medications  Medication Sig Dispense Refill   albuterol (PROVENTIL HFA;VENTOLIN HFA) 108 (90 BASE) MCG/ACT inhaler Inhale into the lungs every 6 (six) hours as needed for wheezing or shortness of breath.     azelastine (ASTELIN) 0.1 % nasal spray Place 2 sprays into both nostrils 2 (two) times daily. Use in each nostril as directed     carvedilol (COREG) 25 MG tablet TAKE 1 TABLET BY MOUTH TWICE A DAY 60 tablet 6   dexlansoprazole (DEXILANT) 60 MG capsule Take 60 mg by mouth daily.     fexofenadine (ALLEGRA) 180 MG tablet Take 180 mg by mouth 2 (two) times daily.      fluticasone (CUTIVATE) 0.05 % cream Apply topically 2 (two) times daily.     Fluticasone-Salmeterol (ADVAIR DISKUS)  250-50 MCG/DOSE AEPB Inhale 1 puff into the lungs 2 (two) times daily. 180 each 4   furosemide (LASIX) 20 MG tablet TAKE 1 TABLET BY MOUTH EVERY DAY 30 tablet 6   sacubitril-valsartan (ENTRESTO) 24-26 MG Take 1 tablet by mouth 2 (two) times daily. 60 tablet 6   sertraline (ZOLOFT) 100 MG tablet Take 100 mg by mouth in the morning and at bedtime.     spironolactone (ALDACTONE) 25 MG tablet TAKE 1 TABLET BY MOUTH EVERY DAY 30 tablet 6   sucralfate (CARAFATE) 1 g tablet Take 1 tablet (1 g total) by mouth 4 (four) times daily -  with meals and at bedtime. 120 tablet 0   No current facility-administered medications for this visit.    Family History  Problem Relation Age of Onset   Diabetes Mother    Hypertension Mother    Hypothyroidism Mother    Lung cancer Maternal Grandmother    Breast cancer Maternal Aunt    Brain cancer Maternal Uncle    Prostate cancer Paternal Uncle    Colon cancer Neg Hx    Colon polyps Neg Hx    Esophageal cancer Neg Hx     Review of Systems  Genitourinary:  Positive for vaginal pain.  All other systems reviewed and are negative.  Exam:   BP 110/78   Pulse 88   Ht 5\' 1"  (1.549 m)   Wt 160 lb (72.6 kg)   LMP 05/30/2021 (Exact Date)   SpO2 100%   BMI 30.23 kg/m   Weight change: @WEIGHTCHANGE @ Height:   Height: 5\' 1"  (154.9 cm)  Ht Readings from Last 3 Encounters:  06/04/21 5\' 1"  (1.549 m)  05/22/21 5' (1.524 m)  04/29/21 5' 0.25" (1.53 m)    General appearance: alert, cooperative and appears stated age Head: Normocephalic, without obvious abnormality, atraumatic Neck: no adenopathy, supple, symmetrical, trachea midline and thyroid normal to inspection and palpation Abdomen: soft, non-tender; non distended,  no masses,  no organomegaly Extremities: extremities normal, atraumatic, no cyanosis or edema Skin: Skin color, texture, turgor normal. No rashes or lesions Lymph nodes: Cervical, supraclavicular, and axillary nodes normal. No abnormal inguinal  nodes palpated Neurologic: Grossly normal   Pelvic: External genitalia:  no lesions              Urethra:  normal appearing urethra with no masses, tenderness or lesions              Bartholins and Skenes: normal                 Vagina: normal appearing vagina with normal color and discharge, no lesions  Cervix: no lesions               Bimanual Exam:  Uterus:  normal size, contour, position, consistency, mobility, non-tender and anteverted              Adnexa: no mass, fullness, tenderness               Rectovaginal: Confirms               Anus:  normal sphincter tone, no lesions  Gae Dry chaperoned for the exam.  1. Irregular bleeding She went 6 months without a cycle, then had bleeding for 6 days each month for the last 2 months - Follicle stimulating hormone - TSH - Endometrial biopsy; Future  2. Perimenopause   3. LLQ abdominal pain Suspect gas pain, normal GYN ultrasound and exam  4. Dyspareunia, female Positional, normal ultrasound and exam. Patient reassured.   CC: Dr Carlean Purl

## 2021-06-13 ENCOUNTER — Other Ambulatory Visit: Payer: Self-pay | Admitting: Internal Medicine

## 2021-06-18 ENCOUNTER — Encounter: Payer: Self-pay | Admitting: Obstetrics and Gynecology

## 2021-06-18 ENCOUNTER — Other Ambulatory Visit: Payer: Self-pay

## 2021-06-18 ENCOUNTER — Ambulatory Visit (INDEPENDENT_AMBULATORY_CARE_PROVIDER_SITE_OTHER): Payer: BC Managed Care – PPO | Admitting: Obstetrics and Gynecology

## 2021-06-18 ENCOUNTER — Other Ambulatory Visit (HOSPITAL_COMMUNITY)
Admission: RE | Admit: 2021-06-18 | Discharge: 2021-06-18 | Disposition: A | Discharge status: Home / Self Care | Payer: BC Managed Care – PPO | Source: Ambulatory Visit | Attending: Obstetrics and Gynecology | Admitting: Obstetrics and Gynecology

## 2021-06-18 ENCOUNTER — Telehealth: Payer: Self-pay

## 2021-06-18 VITALS — BP 120/74 | HR 75 | Wt 159.4 lb

## 2021-06-18 DIAGNOSIS — N926 Irregular menstruation, unspecified: Secondary | ICD-10-CM

## 2021-06-18 NOTE — Telephone Encounter (Signed)
Unscheduled transmission Effective CRT 79.7% VSE 8.39min/day, markers suggest atrial undersensing No PVC count. Route to triage  Transmission reviewed. Noted patient has appointment with [facility] 07/07/21 at 3pm. May need atrial sensitivity adjustment. No know history of AT/AF. Successful telephone encounter to patient to confirm appointment and to assess for symptoms. Patient states she is doing well and feels fine. Will continue to monitor.

## 2021-06-18 NOTE — Progress Notes (Signed)
GYNECOLOGY  VISIT   HPI: 50 y.o.   Divorced Black or [place] American Not Hispanic or Latino  female   [phone] with Patient's last menstrual period was 05/30/2021 (exact date).   here for an endometrial biopsy for AUB. She went 6 months without a cycle, then had 6 days of bleeding in 8/22 and 9/22    GYNECOLOGIC HISTORY: Patient's last menstrual period was 05/30/2021 (exact date). Contraception:tubal ligation Menopausal hormone therapy: none         OB History     Gravida  3   Para  3   Term  3   Preterm      AB      Living  2      SAB      IAB      Ectopic      Multiple      Live Births  3              Patient Active Problem List   Diagnosis Date Noted   ICD (implantable cardioverter-defibrillator), biventricular, in situ 12/11/2019   Fatigue 69/62/9528   Chronic systolic heart failure ([place]) 06/12/2015   Nonischemic cardiomyopathy ([place]) 02/27/2015   Essential hypertension 02/27/2015   Cardiomyopathy ([place])    Acute systolic heart failure ([place]) 02/22/2015   Functional dyspepsia 01/23/2011   Asthma 12/28/2007   GERD with stricture 12/28/2007    Past Medical History:  Diagnosis Date   Abnormal pap 9/13   ASCUS + HPV, 11/14 LGSIL   Anxiety    Asthma    Cardiac defibrillator in place    Chronic systolic CHF (congestive heart failure) ([place])    a. 02/2015 Echo: EF 20%, sev dil w/ diff HK, worse @ inf base. Tiv AI, mild MR, mod dil LA, PASP 64mmHg.   Functional dyspepsia    early satiety    GERD with stricture    dilated 4132   HELICOBACTER PYLORI GASTRITIS 01/15/2009   dyspnea, ? reaction to Biaxin/amoxicillin  Pylera 01/15/09 - completed therapy      NICM (nonischemic cardiomyopathy) ([place])    a. 02/2015 Cath: nl cors, EF 15%, mild PAH;  b. 02/2015 Enrolled in VEST trial.   Sickle cell trait ([place])    Situational depression    "son died"    Past Surgical History:  Procedure Laterality Date   BIV ICD GENERATOR CHANGEOUT N/A 01/24/2021   Procedure:  BIV ICD GENERATOR CHANGEOUT;  Surgeon: Deboraha Sprang, MD;  Location: [facility];  Service: Cardiovascular;  Laterality: N/A;   CARDIAC CATHETERIZATION N/A 02/26/2015   Procedure: Right/Left Heart Cath and Coronary Angiography;  Surgeon: Troy Sine, MD;  Location: [facility];  Service: Cardiovascular;  Laterality: N/A;   COLPOSCOPY  2013   neg   EP IMPLANTABLE DEVICE N/A 10/14/2015   Procedure: BiV ICD Insertion CRT-D;  Surgeon: Deboraha Sprang, MD;  Location: [facility];  Service: Cardiovascular;  Laterality: N/A;   ESOPHAGOGASTRODUODENOSCOPY (EGD) WITH ESOPHAGEAL DILATION  11/30/2008   esophageal ring dilated 54 French, H. pylori gastritis   TUBAL LIGATION  1991   WISDOM TOOTH EXTRACTION      Current Outpatient Medications  Medication Sig Dispense Refill   albuterol (PROVENTIL HFA;VENTOLIN HFA) 108 (90 BASE) MCG/ACT inhaler Inhale into the lungs every 6 (six) hours as needed for wheezing or shortness of breath.     azelastine (ASTELIN) 0.1 % nasal spray Place 2 sprays into both nostrils 2 (two) times daily. Use in  each nostril as directed     carvedilol (COREG) 25 MG tablet TAKE 1 TABLET BY MOUTH TWICE A DAY 60 tablet 6   dexlansoprazole (DEXILANT) 60 MG capsule Take 60 mg by mouth daily.     fexofenadine (ALLEGRA) 180 MG tablet Take 180 mg by mouth 2 (two) times daily.      fluticasone (CUTIVATE) 0.05 % cream Apply topically 2 (two) times daily.     Fluticasone-Salmeterol (ADVAIR DISKUS) 250-50 MCG/DOSE AEPB Inhale 1 puff into the lungs 2 (two) times daily. 180 each 4   furosemide (LASIX) 20 MG tablet TAKE 1 TABLET BY MOUTH EVERY DAY 30 tablet 6   sacubitril-valsartan (ENTRESTO) 24-26 MG Take 1 tablet by mouth 2 (two) times daily. 60 tablet 6   sertraline (ZOLOFT) 100 MG tablet Take 100 mg by mouth in the morning and at bedtime.     spironolactone (ALDACTONE) 25 MG tablet TAKE 1 TABLET BY MOUTH EVERY DAY 30 tablet 6   sucralfate (CARAFATE) 1 g tablet TAKE 1 TABLET  (1 G TOTAL) BY MOUTH 4 (FOUR) TIMES DAILY - WITH MEALS AND AT BEDTIME. 120 tablet 0   No current facility-administered medications for this visit.     ALLERGIES: Amoxicillin, Clarithromycin, and Spiriva respimat [tiotropium bromide monohydrate]  Family History  Problem Relation Age of Onset   Diabetes Mother    Hypertension Mother    Hypothyroidism Mother    Lung cancer Maternal Grandmother    Breast cancer Maternal Aunt    Brain cancer Maternal Uncle    Prostate cancer Paternal Uncle    Colon cancer Neg Hx    Colon polyps Neg Hx    Esophageal cancer Neg Hx     Social History   Socioeconomic History   Marital status: Divorced    Spouse name: Not on file   Number of children: 3   Years of education: Not on file   Highest education level: Not on file  Occupational History   Occupation: Advice worker  Tobacco Use   Smoking status: Never   Smokeless tobacco: Never  Vaping Use   Vaping Use: Never used  Substance and Sexual Activity   Alcohol use: Yes    Comment: occasional wine   Drug use: No   Sexual activity: Yes    Partners: Male    Birth control/protection: Surgical    Comment: BTL  Other Topics Concern   Not on file  Social History Narrative   Single, employed in Psychologist, 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  PHYSICAL EXAMINATION:    LMP 05/30/2021 (Exact Date)     General appearance: alert, cooperative and appears stated age  Pelvic: External genitalia:  no lesions              Urethra:  normal appearing urethra with no masses,  tenderness or lesions              Bartholins and Skenes: normal  Vagina: normal appearing vagina with normal color and discharge, no lesions              Cervix: no lesions               Chaperone was present for exam.  1. Irregular bleeding - Endometrial biopsy - Surgical pathology( [place])

## 2021-06-18 NOTE — Patient Instructions (Signed)

## 2021-06-20 LAB — SURGICAL PATHOLOGY

## 2021-06-23 ENCOUNTER — Encounter: Payer: Self-pay | Admitting: Obstetrics and Gynecology

## 2021-06-24 ENCOUNTER — Telehealth: Payer: Self-pay | Admitting: Internal Medicine

## 2021-06-24 ENCOUNTER — Other Ambulatory Visit: Payer: Self-pay

## 2021-06-24 ENCOUNTER — Emergency Department (HOSPITAL_COMMUNITY): Payer: BC Managed Care – PPO

## 2021-06-24 ENCOUNTER — Observation Stay (HOSPITAL_COMMUNITY)
Admission: EM | Admit: 2021-06-24 | Discharge: 2021-06-25 | Disposition: A | Discharge status: Home / Self Care | Payer: BC Managed Care – PPO | Attending: Cardiovascular Disease | Admitting: Cardiovascular Disease

## 2021-06-24 ENCOUNTER — Encounter (HOSPITAL_COMMUNITY): Payer: Self-pay | Admitting: Pharmacy Technician

## 2021-06-24 DIAGNOSIS — I5023 Acute on chronic systolic (congestive) heart failure: Secondary | ICD-10-CM | POA: Insufficient documentation

## 2021-06-24 DIAGNOSIS — J45909 Unspecified asthma, uncomplicated: Secondary | ICD-10-CM | POA: Insufficient documentation

## 2021-06-24 DIAGNOSIS — R0789 Other chest pain: Secondary | ICD-10-CM | POA: Diagnosis not present

## 2021-06-24 DIAGNOSIS — I110 Hypertensive heart disease with heart failure: Secondary | ICD-10-CM | POA: Insufficient documentation

## 2021-06-24 DIAGNOSIS — Z79899 Other long term (current) drug therapy: Secondary | ICD-10-CM | POA: Diagnosis not present

## 2021-06-24 DIAGNOSIS — R079 Chest pain, unspecified: Secondary | ICD-10-CM | POA: Diagnosis present

## 2021-06-24 DIAGNOSIS — Z20822 Contact with and (suspected) exposure to covid-19: Secondary | ICD-10-CM | POA: Diagnosis not present

## 2021-06-24 LAB — BASIC METABOLIC PANEL
Anion gap: 9 (ref 5–15)
BUN: 9 mg/dL (ref 6–20)
CO2: 26 mmol/L (ref 22–32)
Calcium: 9.2 mg/dL (ref 8.9–10.3)
Chloride: 101 mmol/L (ref 98–111)
Creatinine, Ser: 0.92 mg/dL (ref 0.44–1.00)
GFR, Estimated: >60 mL/min (ref >60)
Glucose, Bld: 83 mg/dL (ref 70–99)
Potassium: 3.7 mmol/L (ref 3.5–5.1)
Sodium: 136 mmol/L (ref 135–145)

## 2021-06-24 LAB — I-STAT BETA HCG BLOOD, ED (MC, WL, AP ONLY): I-stat hCG, quantitative: 5.8 m[IU]/mL — ABNORMAL HIGH (ref <5)

## 2021-06-24 LAB — CBC
HCT: 36.1 % (ref 36.0–46.0)
Hemoglobin: 11.9 g/dL — ABNORMAL LOW (ref 12.0–15.0)
MCH: 27.6 pg (ref 26.0–34.0)
MCHC: 33 g/dL (ref 30.0–36.0)
MCV: 83.8 fL (ref 80.0–100.0)
Platelets: 205 10*3/uL (ref 150–400)
RBC: 4.31 MIL/uL (ref 3.87–5.11)
RDW: 13.6 % (ref 11.5–15.5)
WBC: 5.8 10*3/uL (ref 4.0–10.5)
nRBC: 0 % (ref 0.0–0.2)

## 2021-06-24 LAB — BRAIN NATRIURETIC PEPTIDE: B Natriuretic Peptide: 17.3 pg/mL (ref 0.0–100.0)

## 2021-06-24 LAB — TROPONIN I (HIGH SENSITIVITY)
Troponin I (High Sensitivity): 4 ng/L (ref <18)
Troponin I (High Sensitivity): 5 ng/L (ref <18)

## 2021-06-24 MED ORDER — SPIRONOLACTONE 25 MG PO TABS
25.0000 mg | ORAL_TABLET | Freq: Every day | ORAL | Status: DC
  Administered 2021-06-25: 25 mg via ORAL
  Filled 2021-06-24: qty 1

## 2021-06-24 MED ORDER — SERTRALINE HCL 100 MG PO TABS
100.0000 mg | ORAL_TABLET | Freq: Every day | ORAL | Status: DC
  Administered 2021-06-24 – 2021-06-25 (×2): 100 mg via ORAL
  Filled 2021-06-24 (×2): qty 1

## 2021-06-24 MED ORDER — NITROGLYCERIN 0.4 MG SL SUBL: 0.4000 mg | SUBLINGUAL_TABLET | SUBLINGUAL | Status: DC | PRN

## 2021-06-24 MED ORDER — SACUBITRIL-VALSARTAN 24-26 MG PO TABS
1.0000 | ORAL_TABLET | Freq: Two times a day (BID) | ORAL | Status: DC
  Administered 2021-06-24 – 2021-06-25 (×2): 1 via ORAL
  Filled 2021-06-24 (×2): qty 1

## 2021-06-24 MED ORDER — PANTOPRAZOLE SODIUM 40 MG PO TBEC
40.0000 mg | DELAYED_RELEASE_TABLET | Freq: Every day | ORAL | Status: DC
  Administered 2021-06-24 – 2021-06-25 (×2): 40 mg via ORAL
  Filled 2021-06-24 (×2): qty 1

## 2021-06-24 MED ORDER — ALPRAZOLAM 0.25 MG PO TABS: 0.2500 mg | ORAL_TABLET | Freq: Two times a day (BID) | ORAL | Status: DC | PRN

## 2021-06-24 MED ORDER — SODIUM CHLORIDE 0.9% FLUSH: 3.0000 mL | INTRAVENOUS | Status: DC | PRN

## 2021-06-24 MED ORDER — SODIUM CHLORIDE 0.9% FLUSH
3.0000 mL | Freq: Two times a day (BID) | INTRAVENOUS | Status: DC
  Administered 2021-06-24: 3 mL via INTRAVENOUS

## 2021-06-24 MED ORDER — SODIUM CHLORIDE 0.9 % IV SOLN: 250.0000 mL | INTRAVENOUS | Status: DC | PRN

## 2021-06-24 MED ORDER — NITROGLYCERIN 0.4 MG SL SUBL
0.4000 mg | SUBLINGUAL_TABLET | Freq: Once | SUBLINGUAL | Status: AC
  Administered 2021-06-24: 0.4 mg via SUBLINGUAL
  Filled 2021-06-24: qty 1

## 2021-06-24 MED ORDER — FUROSEMIDE 20 MG PO TABS
20.0000 mg | ORAL_TABLET | Freq: Every day | ORAL | Status: DC
  Administered 2021-06-24 – 2021-06-25 (×2): 20 mg via ORAL
  Filled 2021-06-24 (×2): qty 1

## 2021-06-24 MED ORDER — ACETAMINOPHEN 325 MG PO TABS: 650.0000 mg | ORAL_TABLET | ORAL | Status: DC | PRN

## 2021-06-24 MED ORDER — MOMETASONE FURO-FORMOTEROL FUM 200-5 MCG/ACT IN AERO
2.0000 | INHALATION_SPRAY | Freq: Two times a day (BID) | RESPIRATORY_TRACT | Status: DC
  Filled 2021-06-24: qty 8.8

## 2021-06-24 MED ORDER — ENOXAPARIN SODIUM 40 MG/0.4ML IJ SOSY
40.0000 mg | PREFILLED_SYRINGE | INTRAMUSCULAR | Status: DC
  Administered 2021-06-24: 40 mg via SUBCUTANEOUS
  Filled 2021-06-24: qty 0.4

## 2021-06-24 MED ORDER — ASPIRIN 81 MG PO CHEW
324.0000 mg | CHEWABLE_TABLET | Freq: Once | ORAL | Status: AC
  Administered 2021-06-24: 324 mg via ORAL
  Filled 2021-06-24: qty 4

## 2021-06-24 MED ORDER — ALBUTEROL SULFATE (2.5 MG/3ML) 0.083% IN NEBU: 2.5000 mg | INHALATION_SOLUTION | Freq: Four times a day (QID) | RESPIRATORY_TRACT | Status: DC | PRN

## 2021-06-24 MED ORDER — CARVEDILOL 3.125 MG PO TABS
25.0000 mg | ORAL_TABLET | Freq: Two times a day (BID) | ORAL | Status: DC
  Administered 2021-06-24 – 2021-06-25 (×2): 25 mg via ORAL
  Filled 2021-06-24: qty 2
  Filled 2021-06-24: qty 8

## 2021-06-24 MED ORDER — SUCRALFATE 1 G PO TABS
1.0000 g | ORAL_TABLET | Freq: Three times a day (TID) | ORAL | Status: DC
  Administered 2021-06-24 – 2021-06-25 (×3): 1 g via ORAL
  Filled 2021-06-24 (×3): qty 1

## 2021-06-24 MED ORDER — ONDANSETRON HCL 4 MG/2ML IJ SOLN: 4.0000 mg | Freq: Four times a day (QID) | INTRAMUSCULAR | Status: DC | PRN

## 2021-06-24 MED ORDER — ZOLPIDEM TARTRATE 5 MG PO TABS
5.0000 mg | ORAL_TABLET | Freq: Every evening | ORAL | Status: DC | PRN
Start: 2021-06-24 — End: 2021-06-25

## 2021-06-24 MED ORDER — AZELASTINE HCL 0.1 % NA SOLN
2.0000 | Freq: Two times a day (BID) | NASAL | Status: DC
  Filled 2021-06-24: qty 30

## 2021-06-24 NOTE — ED Triage Notes (Signed)
Pt here with L sided chest pain onset last night. Denies radiation. Hx chf, aicd.

## 2021-06-24 NOTE — H&P (Addendum)
Cardiology History and Physical:   Patient ID: Mary Pratt MRN: [id number]; DOB: [birth year]  Admit date: 06/24/2021 Date of Consult: 06/24/2021  PCP:  Caryl Bis, MD   [facility] Cardiologist:  Carlyle Dolly, MD       Patient Profile:   Mary Pratt is a 50 y.o. female with a hx of  NICM, HFrEF,CRT-D (Medtronic CRT-D 10/14/2015 Dr Caryl Comes), Chronic HFrEF w/ EF 20%-> 60% by 2019, who is being seen 06/24/2021 for the evaluation of chest pain at the request of Dr. Armandina Gemma.  Echo 02/2015 EF 20% severe LV dilation. Cath: normal coronaries.  Echo 08/2015 EF 30%. Septal-lateral dyssynchrony. Medtronic CRT-D placed 10/2015. Echo 02/2017 EF 55-60% .  History of Present Illness:   Mary Pratt was seen in the office 02/14/2021.  She had had a generator change 01/2021.  At her follow-up visit, she was BiV pacing 83% of the time.  Dr. Caryl Comes felt that was acceptable.  At her office visit 6/10, symptomatically, she was doing well.  She was continued on her current medical therapy.  Weight was 163 pounds.  Mary Pratt was wakened sometime during the night last night with upper L chest pain. 4/10. No associated sx. Did not take meds, sx eased off and was able to sleep.   She got up this am, went to work and was ok at that time. The chest pain returned about 9 am, it was 5/10 at that time. She was SOB. She was nauseated and light-headed. She knew that something was wrong. No palpitations.  Her device did not fire.  She called the office and came to the ER as directed. She got SL NTG x 1 and ASA 324. Her pain finally resolved after the meds. The pain lasted about 8-1/2 hours. She had never had this pain before.   Her device did not fire, no palpitations at any time.   Past Medical History:  Diagnosis Date   Abnormal pap 9/13   ASCUS + HPV, 11/14 LGSIL   Anxiety    Asthma    Cardiac defibrillator in place    Chronic systolic CHF (congestive heart failure) ([place])    a. 02/2015  Echo: EF 20%, sev dil w/ diff HK, worse @ inf base. Tiv AI, mild MR, mod dil LA, PASP 86mmHg.   Functional dyspepsia    early satiety    GERD with stricture    dilated 8841   HELICOBACTER PYLORI GASTRITIS 01/15/2009   dyspnea, ? reaction to Biaxin/amoxicillin  Pylera 01/15/09 - completed therapy      NICM (nonischemic cardiomyopathy) ([place])    a. 02/2015 Cath: nl cors, EF 15%, mild PAH;  b. 02/2015 Enrolled in VEST trial.   Sickle cell trait ([place])    Situational depression    "son died"    Past Surgical History:  Procedure Laterality Date   BIV ICD GENERATOR CHANGEOUT N/A 01/24/2021   Procedure: BIV ICD GENERATOR CHANGEOUT;  Surgeon: Deboraha Sprang, MD;  Location: [facility];  Service: Cardiovascular;  Laterality: N/A;   CARDIAC CATHETERIZATION N/A 02/26/2015   Procedure: Right/Left Heart Cath and Coronary Angiography;  Surgeon: Troy Sine, MD;  Location: [facility];  Service: Cardiovascular;  Laterality: N/A;   COLPOSCOPY  2013   neg   EP IMPLANTABLE DEVICE N/A 10/14/2015   Procedure: BiV ICD Insertion CRT-D;  Surgeon: Deboraha Sprang, MD;  Location: [facility];  Service: Cardiovascular;  Laterality: N/A;   ESOPHAGOGASTRODUODENOSCOPY (EGD)  WITH ESOPHAGEAL DILATION  11/30/2008   esophageal ring dilated 80 French, H. pylori gastritis   TUBAL LIGATION  1991   WISDOM TOOTH EXTRACTION       Home Medications:  Prior to Admission medications   Medication Sig Start Date End Date Taking? Authorizing Provider  albuterol (PROVENTIL HFA;VENTOLIN HFA) 108 (90 BASE) MCG/ACT inhaler Inhale into the lungs every 6 (six) hours as needed for wheezing or shortness of breath.   Yes [provider]  azelastine (ASTELIN) 0.1 % nasal spray Place 2 sprays into both nostrils 2 (two) times daily. Use in each nostril as directed   Yes [provider]  carvedilol (COREG) 25 MG tablet TAKE 1 TABLET BY MOUTH TWICE A DAY Patient taking differently: Take 25 mg by mouth 2 (two)  times daily with a meal. 04/07/21  Yes Branch, Alphonse Guild, MD  dexlansoprazole (DEXILANT) 60 MG capsule Take 60 mg by mouth daily.   Yes [provider]  fluticasone (CUTIVATE) 0.05 % cream Apply topically 2 (two) times daily as needed (itching). 04/27/21  Yes [provider]  Fluticasone-Salmeterol (ADVAIR DISKUS) 250-50 MCG/DOSE AEPB Inhale 1 puff into the lungs 2 (two) times daily. 03/23/13  Yes [name], Armando Reichert, MD  furosemide (LASIX) 20 MG tablet TAKE 1 TABLET BY MOUTH EVERY DAY Patient taking differently: Take 20 mg by mouth daily. 04/14/21  Yes Verta Ellen., NP  sacubitril-valsartan (ENTRESTO) 24-26 MG Take 1 tablet by mouth 2 (two) times daily. 02/14/21  Yes Verta Ellen., NP  sertraline (ZOLOFT) 100 MG tablet Take 100 mg by mouth in the morning and at bedtime. 01/24/20  Yes [provider]  spironolactone (ALDACTONE) 25 MG tablet TAKE 1 TABLET BY MOUTH EVERY DAY Patient taking differently: Take 25 mg by mouth daily. 04/14/21  Yes Verta Ellen., NP  sucralfate (CARAFATE) 1 g tablet TAKE 1 TABLET (1 G TOTAL) BY MOUTH 4 (FOUR) TIMES DAILY - WITH MEALS AND AT BEDTIME. Patient taking differently: Take 1 g by mouth 4 (four) times daily -  with meals and at bedtime. . 06/13/21  Yes Gatha Mayer, MD    Inpatient Medications: Scheduled Meds:  Continuous Infusions:  PRN Meds:   Allergies:    Allergies  Allergen Reactions   Amoxicillin Other (See Comments)    REACTION: Tacycardia   Clarithromycin Hives    Breaks out and gets whelps   Spiriva Respimat [Tiotropium Bromide Monohydrate] Rash    Social History:   Social History   Socioeconomic History   Marital status: Divorced    Spouse name: Not on file   Number of children: 3   Years of education: Not on file   Highest education level: Not on file  Occupational History   Occupation: texturing operator    Employer: [employer]  Tobacco Use   Smoking status: Never   Smokeless tobacco: Never   Vaping Use   Vaping Use: Never used  Substance and Sexual Activity   Alcohol use: Yes    Comment: occasional wine   Drug use: No   Sexual activity: Yes    Partners: Male    Birth control/protection: Surgical    Comment: BTL  Other Topics Concern   Not on file  Social History Narrative   Single, employed in Psych[employer], 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   2 sons 1 daughter 1 son deceased   Drinks caffeine up to a few a day, never smoker no drug use   Social Determinants of Health  Financial Resource Strain: Not on file  Food Insecurity: Not on file  Transportation Needs: Not on file  Physical Activity: Not on file  Stress: Not on file  Social Connections: Not on file  Intimate Partner Violence: Not on file    Family History:   Family History  Problem Relation Age of Onset   Diabetes Mother    Hypertension Mother    Hypothyroidism Mother    Lung cancer Maternal Grandmother    Breast cancer Maternal Aunt    Brain cancer Maternal Uncle    Prostate cancer Paternal Uncle    Colon cancer Neg Hx    Colon polyps Neg Hx    Esophageal cancer Neg Hx      ROS:  Please see the history of present illness.  All other ROS reviewed and negative.     Physical Exam/Data:   Vitals:   06/24/21 1730 06/24/21 1745 06/24/21 1900 06/24/21 1915  BP: (!) 129/94 (!) 130/98 118/88 124/87  Pulse: 62 66 61 (!) 59  Resp: 14 (!) 21 13 13   Temp:      TempSrc:      SpO2: 98% 95% 95% 95%   No intake or output data in the 24 hours ending 06/24/21 2024 Last 3 Weights 06/18/2021 06/04/2021 05/22/2021  Weight (lbs) 159 lb 6.4 oz 160 lb 162 lb  Weight (kg) 72.303 kg 72.576 kg 73.483 kg     There is no height or weight on file to calculate BMI.  General:  Well nourished, well developed, in no acute distress HEENT: normal Neck: no JVD Vascular: No carotid bruits; Distal pulses 2+ bilaterally Cardiac:  normal S1, S2; RRR; no murmur  Lungs:  clear to auscultation bilaterally, no wheezing, rhonchi or  rales  Abd: soft, nontender, no hepatomegaly  Ext: no edema Musculoskeletal:  No deformities, BUE and BLE strength normal and equal Skin: warm and dry  Neuro:  CNs 2-12 intact, no focal abnormalities noted Psych:  Normal affect   EKG:  The EKG was personally reviewed and demonstrates: Atrial sensed BiV pacing, heart rate 63.  2 sinus, intrinsic ventricular beats that are below her threshold of 50 bpm Telemetry:  Telemetry was personally reviewed and demonstrates: Sinus rhythm, AV pacing  Relevant CV Studies:  08/01/2018: TTE Study Conclusions - Left ventricle: Global longitudinal strain is -17.6% The cavity    size was normal. Wall thickness was normal. Systolic function was    normal. The estimated ejection fraction was in the range of 60%    to 65%. Left ventricular diastolic function parameters were    normal.  - Pulmonary arteries: PA peak pressure: 31 mm Hg (S).  - Pericardium, extracardiac: A trivial pericardial effusion was    identified.    05/20/2015: CPX Conclusion: Exercise testing with gas-exchange demonstrates a  moderate functional impairment when compared to matched sedentary  norms. The patient appears to be circulatory limited primarily  due to HF with blunted HR, BP and O2 response to the exercise.        02/26/2015: R/LHC There is severe left ventricular systolic dysfunction. Very mild elevation of right sided heart pressures Central aortic oxygen saturation 98%; pulmonary artery oxygen saturation 65%.   Dilated severe nonischemic cardiomyopathy with an ejection fraction of 15%. Normal coronary arteries. Mild pulmonary hypertension.   RECOMMENDATION: Aggressive medical therapy for her nonischemic cardiomyopathy with medication titration over the next several months.  Consider interim LifeVest, pending follow-up LV function assessment in several months.    Laboratory Data:  High Sensitivity Troponin:   Recent Labs  Lab 06/24/21 1113 06/24/21 1800   TROPONINIHS 4 5     Chemistry Recent Labs  Lab 06/24/21 1113  NA 136  K 3.7  CL 101  CO2 26  GLUCOSE 83  BUN 9  CREATININE 0.92  CALCIUM 9.2  GFRNONAA >60  ANIONGAP 9    No results for input(s): PROT, ALBUMIN, AST, ALT, ALKPHOS, BILITOT in the last 168 hours. Lipids No results for input(s): CHOL, TRIG, HDL, LABVLDL, LDLCALC, CHOLHDL in the last 168 hours.  Hematology Recent Labs  Lab 06/24/21 1113  WBC 5.8  RBC 4.31  HGB 11.9*  HCT 36.1  MCV 83.8  MCH 27.6  MCHC 33.0  RDW 13.6  PLT 205   Thyroid No results for input(s): TSH, FREET4 in the last 168 hours.  BNP Recent Labs  Lab 06/24/21 1113  BNP 17.3    DDimer No results for input(s): DDIMER in the last 168 hours.   Radiology/Studies:  DG Chest 2 View  Result Date: 06/24/2021 CLINICAL DATA:  Pt having sometimes sharp, sometimes dull chest pain since last night - hx of nonsmoker, CHF, asthma, GERD, NICM EXAM: CHEST - 2 VIEW COMPARISON:  04/28/2019 FINDINGS: Lungs are clear.  Stable left subclavian AICD. Heart size upper limits normal.  Normal mediastinal contour. No effusion. Visualized bones unremarkable. IMPRESSION: No acute cardiopulmonary disease. Electronically Signed   By: Lucrezia Europe M.D.   On: 06/24/2021 12:18     Assessment and Plan:   Chest pain -Cardiac enzymes are negative for MI despite over 8 hours of pain. - Her BNP is within normal limits and she does not have volume overload on exam - Symptoms resolved with nitroglycerin which gave her headache, and aspirin - We will check an echocardiogram - MD advise on further evaluation  2.  History of systolic CHF - Her EF had normalized by 2019 - She does not have volume overload by exam, but chest x-ray or BNP - Continue heart failure medications and check an echo. - The print out from [employer] indicates that her OptiVol may have increased, but this is not clear  3.  S/p Medtronic CRT-D - The ER interrogation has problems with legibility as  well as incompleteness. -Have EP evaluate her in a.m.   Risk Assessment/Risk Scores:     HEAR Score (for undifferentiated chest pain):  HEAR Score: 4  New York Heart Association (NYHA) Functional Class NYHA Class II    For questions or updates, please contact [facility] Please consult [email or web address] for contact info under    Signed, Rosaria Ferries, PA-C  06/24/2021 8:24 PM  Patient seen and examined with Rosaria Ferries, PA-C.  Agree as above, with the following exceptions and changes as noted below.  Mary Pratt is a 50 year old female with a nonischemic cardiomyopathy, heart failure with reduced ejection fraction, CRT-D in place with overall stable CRT pacing per review of interrogation interpretations, no coronary artery disease on cath in 2016 who is being seen for the evaluation of chest pain.  She was at her job which is a physical job when she began to notice chest discomfort.  She also notes that this chest pain woke her from sleep yesterday evening prior to going to work.  She received some nitroglycerin earlier this evening which relieved her chest pain.  She is unclear why this started and has no precipitating events or illnesses that she can think of.  Given her concerns she contacted the office, was  recommended to present to the ER.  In ER, troponins have been negative, she appears euvolemic on exam.  She overall appears well, is asking for food because she is hungry, but felt safer being admitted and observed overnight.  Gen: NAD, CV: RRR, no murmurs, Lungs: clear, Abd: soft, Extrem: Warm, well perfused, no edema, Neuro/Psych: alert and oriented x 3, normal mood and affect. All available labs, radiology testing, previous records reviewed.  It would be reasonable to consider coronary CTA in the morning given no history of coronary artery disease on cath in 2016 and a nonischemic cardiomyopathy.  We will plan for potential coronary CTA pending her symptoms in the morning, and also  echocardiogram which has not been updated since 2019, she did have recovery of EF of note.  Formal device interrogation in the morning for OptiVol and CRT readings, preliminary review of device interrogation in the ER shows no worrisome signs, the patient denies any ICD discharges.  Patient is in agreement with this plan we discussed discharge if evaluation seems favorable.  Elouise Munroe, MD 06/24/21 9:35 PM

## 2021-06-24 NOTE — Telephone Encounter (Signed)
Patient states she is currently having active chest pain 5/10 with nausea.  Stated that she had to leave work.  [facility] as she is in [place] off [address] now.  She verbalized understanding.

## 2021-06-24 NOTE — ED Provider Notes (Signed)
For theEmergency Medicine Provider Triage Evaluation Note  Mary Pratt , a 49 y.o. female  was evaluated in triage.  Pt complains of chest pain on and off and last night.  Patient reports that she had chest pain for she went to bed, when away when she went to sleep.  She has had chest pain again since about 930 this morning.  Patient reports a little bit of nausea, describes the chest pain is aching and sharp.  Patient reports that she is still experiencing chest pain while talk to me, however it is dull in nature.  Patient does have a history of congestive heart failure, has a defibrillator in place for the last 5 years, patient does not have diabetes, does not smoke, does not have a family history of ACS.  Patient denies vomiting, abdominal pain.  Review of Systems  Positive: As above Negative: As above  Physical Exam  BP (!) 113/91 (BP Location: Left Arm)   Pulse 65   Temp 98.3 F (36.8 C) (Oral)   Resp 18   LMP 05/30/2021 (Exact Date)   SpO2 96%  Gen:   Awake, no distress   Resp:  Normal effort  MSK:   Moves extremities without difficulty  Other:  Some TTP of chest wall  Medical Decision Making  Medically screening exam initiated at 11:10 AM.  Appropriate orders placed.  [name] was informed that the remainder of the evaluation will be completed by another provider, this initial triage assessment does not replace that evaluation, and the importance of remaining in the ED until their evaluation is complete.  Chest pain   Dorien Chihuahua 06/24/21 1112    Daleen Bo, MD 06/25/21 [id number]

## 2021-06-24 NOTE — ED Provider Notes (Signed)
[facility] EMERGENCY DEPARTMENT Provider Note   CSN: [id number] Arrival date & time: 06/24/21  1050     History Chief Complaint  Patient presents with   Chest Pain    Mary Pratt is a 50 y.o. female presenting for evaluation of chest pain.  Patient states yesterday she developed left sided chest pain.  Initially was a dull pressure, but then became a stabbing pain.  She had associated nausea and lightheadedness.  Stopped after few hours.  Today while she was at work chest pain began again.  She is continue to have pain, as well as associated nausea and lightheadedness.  Pain is not worse with exertion.  No associated shortness of breath.  She states it feels similar to when she was hospitalized due to heart failure, although states that she does not have any leg swelling or shortness of breath.  No fevers or chills.  No cough.  She follows with cardiology on an outpatient basis, had her AICD replaced a few months ago.  Pain does not radiate.  She describes it as a 4/10 dull and sometimes sharp pain  HPI  HPI: A 50 year old patient with a history of obesity presents for evaluation of chest pain. Initial onset of pain was less than one hour ago. The patient's chest pain is described as heaviness/pressure/tightness, is not worse with exertion and is relieved by nitroglycerin. The patient's chest pain is middle- or left-sided, is not well-localized, is not sharp and does not radiate to the arms/jaw/neck. The patient does not complain of nausea and denies diaphoresis. The patient has no history of stroke, has no history of peripheral artery disease, has not smoked in the past 90 days, denies any history of treated diabetes, has no relevant family history of coronary artery disease (first degree relative at less than age 82), is not hypertensive and has no history of hypercholesterolemia.   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 02/2015 Echo: EF 20%, sev dil w/ diff HK, worse @ inf base. Tiv AI, mild MR, mod dil LA, PASP 49mmHg.   Functional dyspepsia    early satiety    GERD with stricture    dilated 4098   HELICOBACTER PYLORI GASTRITIS 01/15/2009   dyspnea, ? reaction to Biaxin/amoxicillin  Pylera 01/15/09 - completed therapy      NICM (nonischemic cardiomyopathy) ([place])    a. 02/2015 Cath: nl cors, EF 15%, mild PAH;  b. 02/2015 Enrolled in VEST trial.   Sickle cell trait ([place])    Situational depression    "son died"    Patient Active Problem List   Diagnosis Date Noted   ICD (implantable cardioverter-defibrillator), biventricular, in situ 12/11/2019   Fatigue 11/91/4782   Chronic systolic heart failure ([place]) 06/12/2015   Nonischemic cardiomyopathy ([place]) 02/27/2015   Essential hypertension 02/27/2015   Cardiomyopathy ([place])    Acute systolic heart failure ([place]) 02/22/2015   Functional dyspepsia 01/23/2011   Asthma 12/28/2007   GERD with stricture 12/28/2007    Past Surgical History:  Procedure Laterality Date   BIV ICD GENERATOR CHANGEOUT N/A 01/24/2021   Procedure: BIV ICD GENERATOR CHANGEOUT;  Surgeon: Deboraha Sprang, MD;  Location: [facility];  Service: Cardiovascular;  Laterality: N/A;   CARDIAC CATHETERIZATION N/A 02/26/2015  Procedure: Right/Left Heart Cath and Coronary Angiography;  Surgeon: Troy Sine, MD;  Location: [facility];  Service: Cardiovascular;  Laterality: N/A;   COLPOSCOPY  2013   neg   EP IMPLANTABLE DEVICE N/A 10/14/2015   Procedure: BiV ICD Insertion CRT-D;  Surgeon: Deboraha Sprang, MD;  Location: [facility];  Service: Cardiovascular;  Laterality: N/A;   ESOPHAGOGASTRODUODENOSCOPY (EGD) WITH ESOPHAGEAL DILATION  11/30/2008   esophageal ring dilated 89 French, H. pylori gastritis   TUBAL LIGATION  1991   WISDOM TOOTH EXTRACTION       OB  History     Gravida  3   Para  3   Term  3   Preterm      AB      Living  2      SAB      IAB      Ectopic      Multiple      Live Births  3           Family History  Problem Relation Age of Onset   Diabetes Mother    Hypertension Mother    Hypothyroidism Mother    Lung cancer Maternal Grandmother    Breast cancer Maternal Aunt    Brain cancer Maternal Uncle    Prostate cancer Paternal Uncle    Colon cancer Neg Hx    Colon polyps Neg Hx    Esophageal cancer Neg Hx     Social History   Tobacco Use   Smoking status: Never   Smokeless tobacco: Never  Vaping Use   Vaping Use: Never used  Substance Use Topics   Alcohol use: Yes    Comment: occasional wine   Drug use: No    Home Medications Prior to Admission medications   Medication Sig Start Date End Date Taking? Authorizing Provider  albuterol (PROVENTIL HFA;VENTOLIN HFA) 108 (90 BASE) MCG/ACT inhaler Inhale into the lungs every 6 (six) hours as needed for wheezing or shortness of breath.   Yes [provider]  azelastine (ASTELIN) 0.1 % nasal spray Place 2 sprays into both nostrils 2 (two) times daily. Use in each nostril as directed   Yes [provider]  carvedilol (COREG) 25 MG tablet TAKE 1 TABLET BY MOUTH TWICE A DAY Patient taking differently: Take 25 mg by mouth 2 (two) times daily with a meal. 04/07/21  Yes Branch, Alphonse Guild, MD  dexlansoprazole (DEXILANT) 60 MG capsule Take 60 mg by mouth daily.   Yes [provider]  fluticasone (CUTIVATE) 0.05 % cream Apply topically 2 (two) times daily as needed (itching). 04/27/21  Yes [provider]  Fluticasone-Salmeterol (ADVAIR DISKUS) 250-50 MCG/DOSE AEPB Inhale 1 puff into the lungs 2 (two) times daily. 03/23/13  Yes [name], Armando Reichert, MD  furosemide (LASIX) 20 MG tablet TAKE 1 TABLET BY MOUTH EVERY DAY Patient taking differently: Take 20 mg by mouth daily. 04/14/21  Yes Verta Ellen., NP  sacubitril-valsartan  (ENTRESTO) 24-26 MG Take 1 tablet by mouth 2 (two) times daily. 02/14/21  Yes Verta Ellen., NP  sertraline (ZOLOFT) 100 MG tablet Take 100 mg by mouth in the morning and at bedtime. 01/24/20  Yes [provider]  spironolactone (ALDACTONE) 25 MG tablet TAKE 1 TABLET BY MOUTH EVERY DAY Patient taking differently: Take 25 mg by mouth daily. 04/14/21  Yes Verta Ellen., NP  sucralfate (CARAFATE) 1 g tablet TAKE 1 TABLET (1 G TOTAL) BY  MOUTH 4 (FOUR) TIMES DAILY - WITH MEALS AND AT BEDTIME. Patient taking differently: Take 1 g by mouth 4 (four) times daily -  with meals and at bedtime. . 06/13/21  Yes Gatha Mayer, MD    Allergies    Amoxicillin, Clarithromycin, and Spiriva respimat [tiotropium bromide monohydrate]  Review of Systems   Review of Systems  Cardiovascular:  Positive for chest pain.  Gastrointestinal:  Positive for nausea.  Neurological:  Positive for light-headedness.  All other systems reviewed and are negative.  Physical Exam Updated Vital Signs BP (!) 138/91   Pulse 64   Temp 98.3 F (36.8 C) (Oral)   Resp 18   LMP 05/30/2021 (Exact Date)   SpO2 97%   Physical Exam Vitals and nursing note reviewed.  Constitutional:      General: She is not in acute distress.    Appearance: Normal appearance.     Comments: Nontoxic  HENT:     Head: Normocephalic and atraumatic.  Eyes:     Conjunctiva/sclera: Conjunctivae normal.     Pupils: Pupils are equal, round, and reactive to light.  Cardiovascular:     Rate and Rhythm: Normal rate and regular rhythm.     Pulses: Normal pulses.  Pulmonary:     Effort: Pulmonary effort is normal. No respiratory distress.     Breath sounds: Normal breath sounds. No wheezing.     Comments: Speaking in full sentences.  Clear lung sounds in all fields. No ttp of the chest wall.  Chest:     Chest wall: No tenderness.  Abdominal:     General: There is no distension.     Palpations: Abdomen is soft. There is no mass.      Tenderness: There is no abdominal tenderness. There is no guarding or rebound.  Musculoskeletal:        General: Normal range of motion.     Cervical back: Normal range of motion and neck supple.     Right lower leg: No edema.     Left lower leg: No edema.     Comments: No swelling/edema  Skin:    General: Skin is warm and dry.     Capillary Refill: Capillary refill takes less than 2 seconds.  Neurological:     Mental Status: She is alert and oriented to person, place, and time.  Psychiatric:        Mood and Affect: Mood and affect normal.        Speech: Speech normal.        Behavior: Behavior normal.    ED Results / Procedures / Treatments   Labs (all labs ordered are listed, but only abnormal results are displayed) Labs Reviewed  CBC - Abnormal; Notable for the following components:      Result Value   Hemoglobin 11.9 (*)    All other components within normal limits  I-STAT BETA HCG BLOOD, ED ([facility], [facility], [facility] ONLY) - Abnormal; Notable for the following components:   I-stat hCG, quantitative 5.8 (*)    All other components within normal limits  RESP PANEL BY RT-PCR (FLU A&B, COVID) ARPGX2  BASIC METABOLIC PANEL  BRAIN NATRIURETIC PEPTIDE  TROPONIN I (HIGH SENSITIVITY)  TROPONIN I (HIGH SENSITIVITY)    EKG None  Radiology DG Chest 2 View  Result Date: 06/24/2021 CLINICAL DATA:  Pt having sometimes sharp, sometimes dull chest pain since last night - hx of nonsmoker, CHF, asthma, GERD, NICM EXAM: CHEST - 2 VIEW COMPARISON:  04/28/2019 FINDINGS:  Lungs are clear.  Stable left subclavian AICD. Heart size upper limits normal.  Normal mediastinal contour. No effusion. Visualized bones unremarkable. IMPRESSION: No acute cardiopulmonary disease. Electronically Signed   By: Lucrezia Europe M.D.   On: 06/24/2021 12:18    Procedures Procedures   Medications Ordered in ED Medications  aspirin chewable tablet 324 mg (324 mg Oral Given 06/24/21 1838)  nitroGLYCERIN (NITROSTAT) SL tablet  0.4 mg (0.4 mg Sublingual Given 06/24/21 1838)    ED Course  I have reviewed the triage vital signs and the nursing notes.  Pertinent labs & imaging results that were available during my care of the patient were reviewed by me and considered in my medical decision making (see chart for details).    MDM Rules/Calculators/A&P HEAR Score: 4                         Patient presenting for evaluation of chest pain.  On exam, patient appears nontoxic.  While she states this feels similar to when she has had heart failure before, clinically she does not appear fluid overloaded.  However she did recently have a procedure in which the AICD was changed out.  We will interrogate.  Initial labs overall reassuring, troponin negative.  Electrolytes stable.  Chest x-ray viewed and independently interpreted by me, no pneumonia pnx effusion.  EKG nonischemic.  However considering patient continues to have pain and has cardiac history, will consult with cardiology.  AICD interrogation shows concern for capture issue of the LV lead.  Otherwise no arrhythmias.  Delta troponin negative.  Pain improved slightly with nitro, however soon returned.  Cardiology evaluated the patient.  Recommends admission for echo tomorrow and further investigation of the AICD.   Final Clinical Impression(s) / ED Diagnoses Final diagnoses:  Nonspecific chest pain    Rx / DC Orders ED Discharge Orders     None        Franchot Heidelberg, PA-C 06/24/21 2104    Regan Lemming, MD 06/24/21 2220

## 2021-06-24 NOTE — Telephone Encounter (Signed)
Pt c/o of Chest Pain: STAT if CP now or developed within 24 hours  1. Are you having CP right now?  Yes   2. Are you experiencing any other symptoms (ex. SOB, nausea, vomiting, sweating)?  Patient states she felt faint about 15 minutes   3. How long have you been experiencing CP?  Patient states CP developed last night   4. Is your CP continuous or coming and going?  Coming and going   5. Have you taken Nitroglycerin? No, patient states doesn't have any nitroglycerin ?

## 2021-06-25 ENCOUNTER — Observation Stay (HOSPITAL_BASED_OUTPATIENT_CLINIC_OR_DEPARTMENT_OTHER): Payer: BC Managed Care – PPO

## 2021-06-25 DIAGNOSIS — R079 Chest pain, unspecified: Secondary | ICD-10-CM | POA: Diagnosis not present

## 2021-06-25 DIAGNOSIS — I420 Dilated cardiomyopathy: Secondary | ICD-10-CM

## 2021-06-25 LAB — ECHOCARDIOGRAM COMPLETE
AR max vel: 2.07 cm2
AV Area VTI: 2.25 cm2
AV Area mean vel: 1.98 cm2
AV Mean grad: 4 mmHg
AV Peak grad: 6.5 mmHg
Ao pk vel: 1.27 m/s
Area-P 1/2: 3.56 cm2
Height: 61 in
MV VTI: 2.85 cm2
S' Lateral: 2.4 cm
Weight: 2512 oz

## 2021-06-25 LAB — CBC
HCT: 35.7 % — ABNORMAL LOW (ref 36.0–46.0)
Hemoglobin: 11.9 g/dL — ABNORMAL LOW (ref 12.0–15.0)
MCH: 28.3 pg (ref 26.0–34.0)
MCHC: 33.3 g/dL (ref 30.0–36.0)
MCV: 85 fL (ref 80.0–100.0)
Platelets: 199 10*3/uL (ref 150–400)
RBC: 4.2 MIL/uL (ref 3.87–5.11)
RDW: 13.6 % (ref 11.5–15.5)
WBC: 6.5 10*3/uL (ref 4.0–10.5)
nRBC: 0 % (ref 0.0–0.2)

## 2021-06-25 LAB — RESP PANEL BY RT-PCR (FLU A&B, COVID) ARPGX2
Influenza A by PCR: NEGATIVE
Influenza B by PCR: NEGATIVE
SARS Coronavirus 2 by RT PCR: NEGATIVE

## 2021-06-25 LAB — TSH: TSH: 1.403 u[IU]/mL (ref 0.350–4.500)

## 2021-06-25 LAB — HEMOGLOBIN A1C
Hgb A1c MFr Bld: 5.8 % — ABNORMAL HIGH (ref 4.8–5.6)
Mean Plasma Glucose: 119.76 mg/dL

## 2021-06-25 LAB — CREATININE, SERUM
Creatinine, Ser: 0.91 mg/dL (ref 0.44–1.00)
GFR, Estimated: >60 mL/min (ref >60)

## 2021-06-25 LAB — HIV ANTIBODY (ROUTINE TESTING W REFLEX): HIV Screen 4th Generation wRfx: NONREACTIVE

## 2021-06-25 MED ORDER — NITROGLYCERIN 0.4 MG SL SUBL: 0.4000 mg | SUBLINGUAL_TABLET | SUBLINGUAL | 12 refills | Status: DC | PRN

## 2021-06-25 NOTE — Progress Notes (Signed)
  Echocardiogram 2D Echocardiogram has been performed.  Mary Pratt 06/25/2021, 9:06 AM

## 2021-06-25 NOTE — ED Notes (Signed)
Breakfast Order placed ?

## 2021-06-25 NOTE — ED Notes (Signed)
Lunch ordered 

## 2021-06-25 NOTE — Discharge Summary (Addendum)
Discharge Summary    Patient ID: Mary Pratt MRN: [id number]; DOB: [birth year]  Admit date: 06/24/2021 Discharge date: 06/25/2021  PCP:  Caryl Bis, MD   [facility] Providers Cardiologist:  Carlyle Dolly, MD  Electrophysiologist:  Virl Axe, MD  {   Discharge Diagnoses    Active Problems:   Chest pain with moderate risk for cardiac etiology   NICM    S/p CRT-D   Diagnostic Studies/Procedures    None Pending echo - can be done in outpatient setting    History of Present Illness     Mary Pratt is a 50 y.o. female with  a hx of  NICM, HFrEF,CRT-D (Medtronic CRT-D 10/14/2015 Dr Caryl Comes), Chronic HFrEF w/ EF 20%-> 60% by 2019 admitted for chest pain evaluation.   Echo 02/2015 EF 20% severe LV dilation. Cath: normal coronaries.  Echo 08/2015 EF 30%. Septal-lateral dyssynchrony. Medtronic CRT-D placed 10/2015.  Echo 02/2017 EF 55-60%. She had a generator change 01/2021. At her follow-up visit, she was BiV pacing 83% of the time.  Dr. Caryl Comes felt that was acceptable.  At her office visit 6/10, symptomatically, she was doing well.    Mary Pratt was wakened sometime during the night with upper L chest pain. 4/10. No associated sx. Did not take meds, sx eased off and was able to sleep. She got up in am, went to work and was ok at that time. The chest pain returned about 9 am, it was 5/10 at that time. She was SOB. She was nauseated and light-headed. She knew that something was wrong. No palpitations.  Her device did not fire.   She called the office and came to the ER as directed. She got SL NTG x 1 and ASA 324. Her pain finally resolved after the meds. The pain lasted about 8-1/2 hours. She had never had this pain before.    Her device did not fire, no palpitations at any time.  Hospital Course     Consultants: None   She was admitted over night and ruled out. Troponin remain negative.  HgbA1c 5.8. BNP, electrolytes and kidney function were normal. Cxr was clear. No  sign of volume overload.  No recurrent chest pain. She was seen by Dr. Johnsie Cancel in morning and felt appropriate for discharge with close outpatient follow up on 06/27/21.  Patient has pending echo which can be done in outpatient setting. Plan to continue home medications with addition of PRN nitro.   The patient been seen by Dr. Audelia Acton today and deemed ready for discharge home. All follow-up appointments have been scheduled. Discharge medications are listed below.    Did the patient have an acute coronary syndrome (MI, NSTEMI, STEMI, etc) this admission?:  No                               Did the patient have a percutaneous coronary intervention (stent / angioplasty)?:  No.     Discharge Vitals Blood pressure 108/80, pulse 60, temperature 98 F (36.7 C), temperature source Oral, resp. rate 18, height 5\' 1"  (1.549 m), weight 71.2 kg, last menstrual period 05/30/2021, SpO2 96 %.  Filed Weights   06/25/21 0750  Weight: 71.2 kg   Physical Exam Constitutional:      Appearance: She is well-developed.  Eyes:     Extraocular Movements: Extraocular movements intact.     Pupils: Pupils are equal, round, and reactive to light.  Cardiovascular:  Rate and Rhythm: Normal rate and regular rhythm.     Heart sounds: Normal heart sounds.  Pulmonary:     Effort: Pulmonary effort is normal.     Breath sounds: Normal breath sounds.  Abdominal:     General: Bowel sounds are normal.     Palpations: Abdomen is soft.  Musculoskeletal:        General: Normal range of motion.     Cervical back: Normal range of motion.  Skin:    General: Skin is warm and dry.  Neurological:     General: No focal deficit present.     Mental Status: She is alert and oriented to person, place, and time.  Psychiatric:        Mood and Affect: Mood normal.        Behavior: Behavior normal.    Labs & Radiologic Studies    CBC Recent Labs    06/24/21 1113 06/24/21 2140  WBC 5.8 6.5  HGB 11.9* 11.9*  HCT 36.1 35.7*   MCV 83.8 85.0  PLT 205 010   Basic Metabolic Panel Recent Labs    06/24/21 1113 06/24/21 2140  NA 136  --   K 3.7  --   CL 101  --   CO2 26  --   GLUCOSE 83  --   BUN 9  --   CREATININE 0.92 0.91  CALCIUM 9.2  --     High Sensitivity Troponin:   Recent Labs  Lab 06/24/21 1113 06/24/21 1800  TROPONINIHS 4 5     Hemoglobin A1C Recent Labs    06/24/21 2140  HGBA1C 5.8*   Thyroid Function Tests Recent Labs    06/24/21 2140  TSH 1.403   _____________  DG Chest 2 View  Result Date: 06/24/2021 CLINICAL DATA:  Pt having sometimes sharp, sometimes dull chest pain since last night - hx of nonsmoker, CHF, asthma, GERD, NICM EXAM: CHEST - 2 VIEW COMPARISON:  04/28/2019 FINDINGS: Lungs are clear.  Stable left subclavian AICD. Heart size upper limits normal.  Normal mediastinal contour. No effusion. Visualized bones unremarkable. IMPRESSION: No acute cardiopulmonary disease. Electronically Signed   By: Lucrezia Europe M.D.   On: 06/24/2021 12:18   Disposition   Pt is being discharged home today in good condition.  Follow-up Plans & Appointments     Follow-up Information     Verta Ellen., NP Follow up on 06/27/2021.   Specialty: Cardiology Why: @9 :30am Contact information: [address] [place] [address] [phone]                Discharge Instructions     Diet - low sodium heart healthy   Complete by: As directed    Increase activity slowly   Complete by: As directed        Discharge Medications   Allergies as of 06/25/2021       Reactions   Amoxicillin Other (See Comments)   REACTION: Tacycardia   Clarithromycin Hives   Breaks out and gets whelps   Spiriva Respimat [tiotropium Bromide Monohydrate] Rash        Medication List     TAKE these medications    albuterol 108 (90 Base) MCG/ACT inhaler Commonly known as: VENTOLIN HFA Inhale into the lungs every 6 (six) hours as needed for wheezing or shortness of  breath.   azelastine 0.1 % nasal spray Commonly known as: ASTELIN Place 2 sprays into both nostrils 2 (two) times daily. Use in  each nostril as directed   carvedilol 25 MG tablet Commonly known as: COREG TAKE 1 TABLET BY MOUTH TWICE A DAY What changed: when to take this   dexlansoprazole 60 MG capsule Commonly known as: DEXILANT Take 60 mg by mouth daily.   fluticasone 0.05 % cream Commonly known as: CUTIVATE Apply topically 2 (two) times daily as needed (itching).   Fluticasone-Salmeterol 250-50 MCG/DOSE Aepb Commonly known as: Advair Diskus Inhale 1 puff into the lungs 2 (two) times daily.   furosemide 20 MG tablet Commonly known as: LASIX TAKE 1 TABLET BY MOUTH EVERY DAY   nitroGLYCERIN 0.4 MG SL tablet Commonly known as: NITROSTAT Place 1 tablet (0.4 mg total) under the tongue every 5 (five) minutes x 3 doses as needed for chest pain.   sacubitril-valsartan 24-26 MG Commonly known as: ENTRESTO Take 1 tablet by mouth 2 (two) times daily.   sertraline 100 MG tablet Commonly known as: ZOLOFT Take 100 mg by mouth in the morning and at bedtime.   spironolactone 25 MG tablet Commonly known as: ALDACTONE TAKE 1 TABLET BY MOUTH EVERY DAY   sucralfate 1 g tablet Commonly known as: CARAFATE TAKE 1 TABLET (1 G TOTAL) BY MOUTH 4 (FOUR) TIMES DAILY - WITH MEALS AND AT BEDTIME. What changed: additional instructions        Outstanding Labs/Studies   None  Duration of Discharge Encounter   Greater than 30 minutes including physician time.  Jarrett Soho, PA 06/25/2021, 8:15 AM  Patient examined chart reviewed discussed care with PA/patient exam benign troponin negative no acute ECG changes No indication for inpatient stay can have f/u as outpatient including echo Continue Rx for CHF euvolemic   Jenkins Rouge MD [facility]

## 2021-06-25 NOTE — Progress Notes (Signed)
  Device interrogated.   As noted on previous transmission atrial undersensing noted.   P waves 0.5 mv with sensitivity currently right at 0.45.  Sensitivity changed to 0.30 with resolution of atrial undersensing  FFRWs noted which currently are all falling into blanking.  Pt had improved % of BiV pacing s/p A sensitivity adjustment.   RA and RV threshold stable. LV threshold right at programmed amplitude of 3.25 V @ 1.0 ms. Programmed to 3.75V @ 1.0 ms for 0.5 V safety margin.  None of this would have been contributory to pts chest pain.   Legrand Como [address] [place], PA-C  06/25/2021 9:50 AM

## 2021-06-26 NOTE — Progress Notes (Signed)
Cardiology Office Note  Date: 06/27/2021   ID: ANDA SOBOTTA, DOB December 24, 1970, MRN [id number]  PCP:  Caryl Bis, MD  Cardiologist:  Carlyle Dolly, MD Electrophysiologist:  Virl Axe, MD   Chief Complaint: Hospital follow-up  History of Present Illness: Mary Pratt is a 50 y.o. female with a history of NICM, HFrEF,CRT-D (Medtronic CRT-D 10/14/2015 Dr Caryl Comes), Chronic HFrEF.  Echo 02/2015 EF 20% severe LV dilation. Cath: normal coronaries.  Echo 08/2015 EF 30%. Septal-lateral dyssynchrony. Medtronic CRT-D placed 10/2015. Echo 02/2017 EF 55-60% .  Last saw [name] PA-C 12/15/2019: She was doing well. No CP, No SOB and minimal DOE, No presyncope or syncope.  She was  seen via telemedicine visit with Dr Caryl Comes on 12/12/2020. She had been doing well with some mild SOB and primary complaint of fatigue.  She was having some edema. Her ICD was nearing ERI.  On 01/24/2021 She received ICD generator change.   Recent presentation to [facility] ED on 06/24/2021 with upper left chest pain 4 out of 10.  Occurred during the night.  Had no associated symptoms.    Chest pain returned about 9 AM and 5/10.  She was short of breath, nauseated, lightheaded.  She called office and came to ER.  She received sublingual nitroglycerin x1 and aspirin 324.  Pain resolved after the medications.  Pain had lasted approximately 8-1/2 hours.  She did not have AICD shock or palpitations.  She was admitted for observation and ruled out with troponins negative.  She was seen by Dr. Johnsie Cancel and was felt appropriate for discharge on 06/25/2021.  She had a pending echo which could be done in outpatient setting.  Plan was to continue home medications with addition of nitroglycerin as needed.  She is here for hospital follow-up status post recent presentation to [facility] ED for chest pain.  She was ruled out for ACS.  She had an echocardiogram while in hospital.  EF had reduced some to 50 to 55% from previous study in  November 2019 when it was 60 to 65%.  Still continues to describe some chest discomfort which appears to be worse with exertional activity.  States she has some occasional nausea with the chest pain.  She states she had some nausea prior to presentation to recent ED for chest pain when it occurred.  She has not taken any nitroglycerin since being discharged.  Her blood pressure is elevated today but she has not taken her medication as of yet.  We discussed recent echocardiogram results.  She denies any SOB or DOE.  PND, orthopnea.  No orthostatic symptoms i.e. lightheadedness, dizziness, presyncope or syncopal episodes.  Denies any CVA or TIA-like symptoms.  No bleeding.  No claudication-like symptoms, DVT or PE-like symptoms, lower extremity edema.    Past Medical History:  Diagnosis Date   Abnormal pap 9/13   ASCUS + HPV, 11/14 LGSIL   Anxiety    Asthma    Cardiac defibrillator in place    Chronic systolic CHF (congestive heart failure) ([place])    a. 02/2015 Echo: EF 20%, sev dil w/ diff HK, worse @ inf base. Tiv AI, mild MR, mod dil LA, PASP 54mmHg.   Functional dyspepsia    early satiety    GERD with stricture    dilated 2703   HELICOBACTER PYLORI GASTRITIS 01/15/2009   dyspnea, ? reaction to Biaxin/amoxicillin  Pylera 01/15/09 - completed therapy      NICM (nonischemic cardiomyopathy) ([place])  a. 02/2015 Cath: nl cors, EF 15%, mild PAH;  b. 02/2015 Enrolled in VEST trial.   Sickle cell trait ([place])    Situational depression    "son died"    Past Surgical History:  Procedure Laterality Date   BIV ICD GENERATOR CHANGEOUT N/A 01/24/2021   Procedure: BIV ICD GENERATOR CHANGEOUT;  Surgeon: Deboraha Sprang, MD;  Location: [place] [facility];  Service: Cardiovascular;  Laterality: N/A;   CARDIAC CATHETERIZATION N/A 02/26/2015   Procedure: Right/Left Heart Cath and Coronary Angiography;  Surgeon: Troy Sine, MD;  Location: [place] [facility];  Service: Cardiovascular;  Laterality: N/A;    COLPOSCOPY  2013   neg   EP IMPLANTABLE DEVICE N/A 10/14/2015   Procedure: BiV ICD Insertion CRT-D;  Surgeon: Deboraha Sprang, MD;  Location: [place] [facility];  Service: Cardiovascular;  Laterality: N/A;   ESOPHAGOGASTRODUODENOSCOPY (EGD) WITH ESOPHAGEAL DILATION  11/30/2008   esophageal ring dilated 54 French, H. pylori gastritis   TUBAL LIGATION  1991   WISDOM TOOTH EXTRACTION      Current Outpatient Medications  Medication Sig Dispense Refill   albuterol (PROVENTIL HFA;VENTOLIN HFA) 108 (90 BASE) MCG/ACT inhaler Inhale into the lungs every 6 (six) hours as needed for wheezing or shortness of breath.     azelastine (ASTELIN) 0.1 % nasal spray Place 2 sprays into both nostrils 2 (two) times daily. Use in each nostril as directed     carvedilol (COREG) 25 MG tablet TAKE 1 TABLET BY MOUTH TWICE A DAY (Patient taking differently: Take 25 mg by mouth 2 (two) times daily with a meal.) 60 tablet 6   dexlansoprazole (DEXILANT) 60 MG capsule Take 60 mg by mouth daily.     fluticasone (CUTIVATE) 0.05 % cream Apply topically 2 (two) times daily as needed (itching).     Fluticasone-Salmeterol (ADVAIR DISKUS) 250-50 MCG/DOSE AEPB Inhale 1 puff into the lungs 2 (two) times daily. 180 each 4   furosemide (LASIX) 20 MG tablet TAKE 1 TABLET BY MOUTH EVERY DAY (Patient taking differently: Take 20 mg by mouth daily.) 30 tablet 6   nitroGLYCERIN (NITROSTAT) 0.4 MG SL tablet Place 1 tablet (0.4 mg total) under the tongue every 5 (five) minutes x 3 doses as needed for chest pain. 25 tablet 12   sacubitril-valsartan (ENTRESTO) 24-26 MG Take 1 tablet by mouth 2 (two) times daily. 60 tablet 6   sertraline (ZOLOFT) 100 MG tablet Take 100 mg by mouth in the morning and at bedtime.     spironolactone (ALDACTONE) 25 MG tablet TAKE 1 TABLET BY MOUTH EVERY DAY (Patient taking differently: Take 25 mg by mouth daily.) 30 tablet 6   sucralfate (CARAFATE) 1 g tablet TAKE 1 TABLET (1 G TOTAL) BY MOUTH 4 (FOUR) TIMES DAILY - WITH  MEALS AND AT BEDTIME. (Patient taking differently: Take 1 g by mouth 4 (four) times daily -  with meals and at bedtime. Marland Kitchen) 120 tablet 0   No current facility-administered medications for this visit.   Allergies:  Amoxicillin, Clarithromycin, and Spiriva respimat [tiotropium bromide monohydrate]   Social History: The patient  reports that she has never smoked. She has never used smokeless tobacco. She reports current alcohol use. She reports that she does not use drugs.   Family History: The patient's family history includes Brain cancer in her maternal uncle; Breast cancer in her maternal aunt; Diabetes in her mother; Hypertension in her mother; Hypothyroidism in her mother; Lung cancer in her maternal grandmother; Prostate cancer in  her paternal uncle.   ROS:  Please see the history of present illness. Otherwise, complete review of systems is positive for none.  All other systems are reviewed and negative.   Physical Exam: VS:  BP (!) 146/100   Pulse 70   Ht 5\' 1"  (1.549 m)   Wt 156 lb 3.2 oz (70.9 kg)   LMP 05/30/2021 (Exact Date)   SpO2 97%   BMI 29.51 kg/m , BMI Body mass index is 29.51 kg/m.  Wt Readings from Last 3 Encounters:  06/27/21 156 lb 3.2 oz (70.9 kg)  06/25/21 157 lb (71.2 kg)  06/18/21 159 lb 6.4 oz (72.3 kg)    General: Patient appears comfortable at rest. Neck: Supple, no elevated JVP or carotid bruits, no thyromegaly. Lungs: Clear to auscultation, nonlabored breathing at rest. Cardiac: Regular rate and rhythm, no S3 or significant systolic murmur, no pericardial rub. Extremities: No pitting edema, distal pulses 2+. Skin: Warm and dry. Musculoskeletal: No kyphosis. Neuropsychiatric: Alert and oriented x3, affect grossly appropriate.  ECG:  An ECG dated 04/01/2020 was personally reviewed today and demonstrated:  Electronic ventricular pacemaker rate of 75 bpm  Recent Labwork: 06/24/2021: B Natriuretic Peptide 17.3; BUN 9; Creatinine, Ser 0.91; Hemoglobin 11.9;  Platelets 199; Potassium 3.7; Sodium 136; TSH 1.403     Component Value Date/Time   CHOL 165 10/27/2017 1529   TRIG 302 (H) 10/27/2017 1529   HDL 43 10/27/2017 1529   CHOLHDL 3.8 10/27/2017 1529   VLDL 60 (H) 10/27/2017 1529   LDLCALC 62 10/27/2017 1529    Other Studies Reviewed Today:  Echocardiogram 06/25/2021  1. Abnormal septal motion from pacing . Left ventricular ejection  fraction, by estimation, is 50 to 55%. The left ventricle has low normal  function. The left ventricle has no regional wall motion abnormalities.  There is mild left ventricular  hypertrophy. Left ventricular diastolic parameters were normal.   2. Pacing wires in RA/RV. Right ventricular systolic function is normal.  The right ventricular size is normal. There is normal pulmonary artery  systolic pressure.   3. The mitral valve is normal in structure. Trivial mitral valve  regurgitation. No evidence of mitral stenosis.   4. The aortic valve is normal in structure. Aortic valve regurgitation is  not visualized. No aortic stenosis is present.   5. The inferior vena cava is normal in size with greater than 50%  respiratory variability, suggesting right atrial pressure of 3 mmHg.       08/01/2018: TTE Study Conclusions - Left ventricle: Global longitudinal strain is -17.6% The cavity    size was normal. Wall thickness was normal. Systolic function was    normal. The estimated ejection fraction was in the range of 60%    to 65%. Left ventricular diastolic function parameters were    normal.  - Pulmonary arteries: PA peak pressure: 31 mm Hg (S).  - Pericardium, extracardiac: A trivial pericardial effusion was    identified.    05/20/2015: CPX Conclusion: Exercise testing with gas-exchange demonstrates a  moderate functional impairment when compared to matched sedentary  norms. The patient appears to be circulatory limited primarily  due to HF with blunted HR, BP and O2 response to the exercise.         02/26/2015: R/LHC There is severe left ventricular systolic dysfunction. Very mild elevation of right sided heart pressures Central aortic oxygen saturation 98%; pulmonary artery oxygen saturation 65%.   Dilated severe nonischemic cardiomyopathy with an ejection fraction of 15%. Normal coronary  arteries. Mild pulmonary hypertension.   RECOMMENDATION: Aggressive medical therapy for her nonischemic cardiomyopathy with medication titration over the next several months.  Consider interim LifeVest, pending follow-up LV function assessment in several months.      Assessment and Plan:  1. Chronic systolic heart failure ([place])   2. Nonischemic cardiomyopathy ([place])   3. AICD (automatic cardioverter/defibrillator) present      1. Chronic systolic heart failure (H[facility]) Continue Coreg 25 mg p.o. twice daily.  Lasix 20 mg daily.  Entresto 24/26 mg p.o. twice daily.  Spironolactone 25 mg p.o. daily.  Recent echocardiogram while hospitalized as noted above.  Her EF had decreased from 60 to 65% on echo in November 2019.  EF on recent echocardiogram 50 to 55%.  2. Nonischemic cardiomyopathy ([place]) Recent echocardiogram during recent hospitalization Abnormal septal motion from patient, EF 50 to 55%, no WMA's, mild LVH, diastolic parameters were normal.  Normal PASP, trivial MR  3. AICD (automatic cardioverter/defibrillator) present Recent ICD generator change by Dr. Caryl Comes on 01/24/2021.  No complications from insertion.  Recent device check in clinic showed normal device function.  4.  Hypertension Blood pressure elevated today at 146/100.  She states she has not taken any of her medications today.  Advised her to start taking medications as directed.  Continue carvedilol 25 mg p.o. twice daily.  Continue Entresto 24/26 mg p.o. daily, continue spironolactone 25 mg p.o. daily.  Continue Lasix 20 mg p.o. daily.  5.  Chest pain Patient has continued chest pain/chest discomfort with some accompanying nausea  worse with exertion.  Please get an exercise Myoview.  Continue sublingual nitroglycerin as needed.  Patient states she has not taken any nitroglycerin since discharge from hospital.  Medication Adjustments/Labs and Tests Ordered: Current medicines are reviewed at length with the patient today.  Concerns regarding medicines are outlined above.   Disposition: Follow-up with Dr. Harl Bowie or APP 1 month  Signed, Levell July, NP 06/27/2021 10:20 AM    [facility] [place], [place], [place] [address] Phone: [phone]; Fax: [phone]

## 2021-06-27 ENCOUNTER — Ambulatory Visit (INDEPENDENT_AMBULATORY_CARE_PROVIDER_SITE_OTHER): Payer: BC Managed Care – PPO | Admitting: Family Medicine

## 2021-06-27 ENCOUNTER — Encounter: Payer: Self-pay | Admitting: Family Medicine

## 2021-06-27 ENCOUNTER — Other Ambulatory Visit: Payer: Self-pay | Admitting: Internal Medicine

## 2021-06-27 ENCOUNTER — Other Ambulatory Visit: Payer: Self-pay

## 2021-06-27 ENCOUNTER — Encounter: Payer: Self-pay | Admitting: *Deleted

## 2021-06-27 VITALS — BP 146/100 | HR 70 | Ht 61.0 in | Wt 156.2 lb

## 2021-06-27 DIAGNOSIS — Z95810 Presence of automatic (implantable) cardiac defibrillator: Secondary | ICD-10-CM

## 2021-06-27 DIAGNOSIS — I5022 Chronic systolic (congestive) heart failure: Secondary | ICD-10-CM | POA: Diagnosis not present

## 2021-06-27 DIAGNOSIS — R079 Chest pain, unspecified: Secondary | ICD-10-CM

## 2021-06-27 DIAGNOSIS — I428 Other cardiomyopathies: Secondary | ICD-10-CM

## 2021-06-27 NOTE — Patient Instructions (Addendum)
Medication Instructions:  Your physician recommends that you continue on your current medications as directed. Please refer to the Current Medication list given to you today.  Labwork: none  Testing/Procedures: Your physician has requested that you have en exercise stress myoview. For further information please visit [email or web address]. Please follow instruction sheet, as given.  Follow-Up: Your physician recommends that you schedule a follow-up appointment in: 1 month  Any Other Special Instructions Will Be Listed Below (If Applicable).  If you need a refill on your cardiac medications before your next appointment, please call your pharmacy.

## 2021-06-30 ENCOUNTER — Ambulatory Visit: Payer: BC Managed Care – PPO | Admitting: Family Medicine

## 2021-07-01 ENCOUNTER — Other Ambulatory Visit: Payer: Self-pay | Admitting: Obstetrics and Gynecology

## 2021-07-01 DIAGNOSIS — N951 Menopausal and female climacteric states: Secondary | ICD-10-CM

## 2021-07-01 DIAGNOSIS — N926 Irregular menstruation, unspecified: Secondary | ICD-10-CM

## 2021-07-01 MED ORDER — MEDROXYPROGESTERONE ACETATE 5 MG PO TABS: ORAL_TABLET | ORAL | 3 refills | Status: DC

## 2021-07-03 ENCOUNTER — Telehealth: Payer: Self-pay

## 2021-07-03 ENCOUNTER — Encounter: Payer: Self-pay | Admitting: *Deleted

## 2021-07-03 NOTE — Telephone Encounter (Signed)
Note sent from appt desk  "Mary Pratt, Mary Pratt Triage Patient lm today stating someone called her about a recommendation. I couldn't find any info in the chart notes. Did one of you call her?  Thanks "   See result note today  "Please let the patient know that her pathology is benign. Her irregular bleeding goes along with her being perimenopausal. I would recommend that she take provera 5 mg for 5 days every other month until she goes 6 months without any bleeding. The provera will just empty out her cavity (if there is no build up she won't bleed). I have called in the script. "  I called patient and left message to call and hit prompt 4 for the triage desk.  See result note for follow up.

## 2021-07-04 ENCOUNTER — Encounter (HOSPITAL_COMMUNITY): Payer: Self-pay

## 2021-07-04 ENCOUNTER — Ambulatory Visit (HOSPITAL_COMMUNITY)
Admission: RE | Admit: 2021-07-04 | Discharge: 2021-07-04 | Disposition: A | Discharge status: Home / Self Care | Payer: BC Managed Care – PPO | Source: Ambulatory Visit | Attending: Family Medicine | Admitting: Family Medicine

## 2021-07-04 ENCOUNTER — Other Ambulatory Visit: Payer: Self-pay

## 2021-07-04 DIAGNOSIS — I428 Other cardiomyopathies: Secondary | ICD-10-CM | POA: Diagnosis not present

## 2021-07-04 DIAGNOSIS — I5022 Chronic systolic (congestive) heart failure: Secondary | ICD-10-CM | POA: Insufficient documentation

## 2021-07-04 DIAGNOSIS — R079 Chest pain, unspecified: Secondary | ICD-10-CM | POA: Insufficient documentation

## 2021-07-04 LAB — NM MYOCAR MULTI W/SPECT W/WALL MOTION / EF
LV dias vol: 74 mL (ref 46–106)
LV sys vol: 25 mL
Nuc Stress EF: 67 %
Peak HR: 92 {beats}/min
RATE: 0.1
Rest HR: 59 {beats}/min
Rest Nuclear Isotope Dose: 11 mCi
SDS: 3
SRS: 2
SSS: 5
Stress Nuclear Isotope Dose: 31.5 mCi
TID: 0.88

## 2021-07-04 MED ORDER — SODIUM CHLORIDE FLUSH 0.9 % IV SOLN
INTRAVENOUS | Status: AC
  Administered 2021-07-04: 10 mL via INTRAVENOUS
  Filled 2021-07-04: qty 10

## 2021-07-04 MED ORDER — TECHNETIUM TC 99M TETROFOSMIN IV KIT
30.0000 | PACK | Freq: Once | INTRAVENOUS | Status: AC | PRN
  Administered 2021-07-04: 31.5 via INTRAVENOUS

## 2021-07-04 MED ORDER — REGADENOSON 0.4 MG/5ML IV SOLN
INTRAVENOUS | Status: AC
  Administered 2021-07-04: 0.4 mg via INTRAVENOUS
  Filled 2021-07-04: qty 5

## 2021-07-04 MED ORDER — TECHNETIUM TC 99M TETROFOSMIN IV KIT
10.0000 | PACK | Freq: Once | INTRAVENOUS | Status: AC | PRN
  Administered 2021-07-04: 11 via INTRAVENOUS

## 2021-07-07 ENCOUNTER — Encounter: Payer: Self-pay | Admitting: *Deleted

## 2021-07-07 ENCOUNTER — Ambulatory Visit (INDEPENDENT_AMBULATORY_CARE_PROVIDER_SITE_OTHER): Payer: BC Managed Care – PPO | Admitting: Internal Medicine

## 2021-07-07 ENCOUNTER — Encounter: Payer: Self-pay | Admitting: Internal Medicine

## 2021-07-07 ENCOUNTER — Other Ambulatory Visit: Payer: Self-pay

## 2021-07-07 ENCOUNTER — Telehealth: Payer: Self-pay | Admitting: *Deleted

## 2021-07-07 VITALS — BP 114/80 | HR 65 | Ht 61.0 in | Wt 156.0 lb

## 2021-07-07 DIAGNOSIS — Z95810 Presence of automatic (implantable) cardiac defibrillator: Secondary | ICD-10-CM

## 2021-07-07 DIAGNOSIS — I428 Other cardiomyopathies: Secondary | ICD-10-CM

## 2021-07-07 DIAGNOSIS — I5022 Chronic systolic (congestive) heart failure: Secondary | ICD-10-CM | POA: Diagnosis not present

## 2021-07-07 DIAGNOSIS — R0683 Snoring: Secondary | ICD-10-CM | POA: Diagnosis not present

## 2021-07-07 NOTE — Telephone Encounter (Signed)
-----   Message from Merlene Laughter, RN sent at 07/07/2021  8:04 AM EDT -----  ----- Message ----- From: Verta Ellen., NP Sent: 07/06/2021   6:22 PM EDT To: Laurine Blazer, LPN  Please call the patient and let her know the stress test was considered a low risk study based on the physician's interpretation.  The pumping function was good.  Verta Ellen, NP  07/06/2021 6:21 PM

## 2021-07-07 NOTE — Progress Notes (Signed)
Patient Care Team: Caryl Bis, MD as PCP - General (Family Medicine) Harl Bowie Alphonse Guild, MD as PCP - Cardiology (Cardiology) Deboraha Sprang, MD as PCP - Electrophysiology (Cardiology)   HPI  Mary Pratt is a 50 y.o. female Seen in follow-up for CRT-D implanted 2/17 for nonischemic cardiomyopathy and congestive heart failure; GEN change 5/22  She is improved following reprogramming.   She was seen in the ED on 06/24/21 for non-specific L-chest pain.  Seen by Dr. Raynaldo Opitz who felt low risk for cardiac disease with negative troponins but overall moderate risk for possible ischemic component.  Echocardiogram and Myoview was ordered; results were normal.  Today, she is doing well. Last night, she reports some lingering pain in her chest.   The patient denies shortness of breath, nocturnal dyspnea, orthopnea or peripheral edema.  There have been no palpitations, lightheadedness or syncope.  Complains of chest pain and fatigue.She does feel tired. Snores   DATE TEST EF   10/22 Echo  50 - 55 %   10/22 Myocar  67 %     Date Cr K Hgb  10/19 0.96 3.9 12  10/22 0.92 3.7 11.9      Records and Results Reviewed--notes from CHF clinic  Past Medical History:  Diagnosis Date   Abnormal pap 9/13   ASCUS + HPV, 11/14 LGSIL   Anxiety    Asthma    Cardiac defibrillator in place    Chronic systolic CHF (congestive heart failure) ([place])    a. 02/2015 Echo: EF 20%, sev dil w/ diff HK, worse @ inf base. Tiv AI, mild MR, mod dil LA, PASP 26mmHg.   Functional dyspepsia    early satiety    GERD with stricture    dilated 8119   HELICOBACTER PYLORI GASTRITIS 01/15/2009   dyspnea, ? reaction to Biaxin/amoxicillin  Pylera 01/15/09 - completed therapy      NICM (nonischemic cardiomyopathy) ([place])    a. 02/2015 Cath: nl cors, EF 15%, mild PAH;  b. 02/2015 Enrolled in VEST trial.   Sickle cell trait ([place])    Situational depression    "son died"    Past Surgical History:  Procedure  Laterality Date   BIV ICD GENERATOR CHANGEOUT N/A 01/24/2021   Procedure: BIV ICD GENERATOR CHANGEOUT;  Surgeon: Deboraha Sprang, MD;  Location: [place] [facility];  Service: Cardiovascular;  Laterality: N/A;   CARDIAC CATHETERIZATION N/A 02/26/2015   Procedure: Right/Left Heart Cath and Coronary Angiography;  Surgeon: Troy Sine, MD;  Location: [facility] [facility];  Service: Cardiovascular;  Laterality: N/A;   COLPOSCOPY  2013   neg   EP IMPLANTABLE DEVICE N/A 10/14/2015   Procedure: BiV ICD Insertion CRT-D;  Surgeon: Deboraha Sprang, MD;  Location: [place];  Service: Cardiovascular;  Laterality: N/A;   ESOPHAGOGASTRODUODENOSCOPY (EGD) WITH ESOPHAGEAL DILATION  11/30/2008   esophageal ring dilated 54 French, H. pylori gastritis   TUBAL LIGATION  1991   WISDOM TOOTH EXTRACTION      Current Outpatient Medications  Medication Sig Dispense Refill   albuterol (PROVENTIL HFA;VENTOLIN HFA) 108 (90 BASE) MCG/ACT inhaler Inhale into the lungs every 6 (six) hours as needed for wheezing or shortness of breath.     azelastine (ASTELIN) 0.1 % nasal spray Place 2 sprays into both nostrils 2 (two) times daily. Use in each nostril as directed     carvedilol (COREG) 25 MG tablet TAKE 1 TABLET BY MOUTH TWICE A DAY (Patient  taking differently: Take 25 mg by mouth 2 (two) times daily with a meal.) 60 tablet 6   dexlansoprazole (DEXILANT) 60 MG capsule Take 60 mg by mouth daily.     Fluticasone-Salmeterol (ADVAIR DISKUS) 250-50 MCG/DOSE AEPB Inhale 1 puff into the lungs 2 (two) times daily. 180 each 4   furosemide (LASIX) 20 MG tablet TAKE 1 TABLET BY MOUTH EVERY DAY (Patient taking differently: Take 20 mg by mouth daily.) 30 tablet 6   medroxyPROGESTERone (PROVERA) 5 MG tablet Take one tablet a day now and for 5 days every other month if no spontaneous menses. 15 tablet 3   nitroGLYCERIN (NITROSTAT) 0.4 MG SL tablet Place 1 tablet (0.4 mg total) under the tongue every 5 (five) minutes x 3 doses as  needed for chest pain. 25 tablet 12   sacubitril-valsartan (ENTRESTO) 24-26 MG Take 1 tablet by mouth 2 (two) times daily. 60 tablet 6   sertraline (ZOLOFT) 100 MG tablet Take 100 mg by mouth in the morning and at bedtime.     spironolactone (ALDACTONE) 25 MG tablet TAKE 1 TABLET BY MOUTH EVERY DAY (Patient taking differently: Take 25 mg by mouth daily.) 30 tablet 6   sucralfate (CARAFATE) 1 g tablet TAKE 1 TABLET (1 G TOTAL) BY MOUTH 4 (FOUR) TIMES DAILY - WITH MEALS AND AT BEDTIME. 360 tablet 1   fluticasone (CUTIVATE) 0.05 % cream Apply topically 2 (two) times daily as needed (itching). (Patient not taking: Reported on 07/07/2021)     No current facility-administered medications for this visit.    Allergies  Allergen Reactions   Amoxicillin Other (See Comments)    REACTION: Tacycardia   Clarithromycin Hives    Breaks out and gets whelps   Spiriva Respimat [Tiotropium Bromide Monohydrate] Rash      Review of Systems negative except from HPI and PMH  Physical Exam BP 114/80   Pulse 65   Ht 5\' 1"  (1.549 m)   Wt 156 lb (70.8 kg)   SpO2 98%   BMI 29.48 kg/m  Well developed and well nourished in no acute distress HENT normal Neck supple with JVP-flat Clear Device pocket well healed; without hematoma or erythema.  There is no tethering  Regular rate and rhythm, no  gallop No  murmur Abd-soft with active BS No Clubbing cyanosis  edema Skin-warm and dry A & Oriented  Grossly normal sensory and motor function  ECG sinus with P synchronous pacing at 65 beats intervals 16/13/45 Negative QRS lead I and negative QRS lead V1   Assessment and  Plan NICM-interval normalization  CHF Chronic systolic  CRT-Medtronic  Fatigue  Chest pain   ER evaluation last week negative troponins; echo and Myoview ordered demonstrating normal LV function no ischemia.  Continue her medications including Entresto 24/26 spironolactone 25 and carvedilol 25 twice daily.  She is euvolemic.  We  will continue her furosemide 20 mg daily  Her fatigue is normal.  She is snoring significantly according to her report from her son.  We will do a sleep study.  Chest pain seems to be noncardiac.  Will defer to PCP   Wilhemina Bonito as a scribe for Virl Axe, MD.,have documented all relevant documentation on the behalf of Virl Axe, MD,as directed by  Virl Axe, MD while in the presence of Virl Axe, MD.  I, Virl Axe, MD, have reviewed all documentation for this visit. The documentation on 07/07/21 for the exam, diagnosis, procedures, and orders are all accurate and complete.

## 2021-07-07 NOTE — Telephone Encounter (Signed)
-----   Message from Thora Lance, RN sent at 07/07/2021  4:31 PM EDT ----- Regarding: slpit night sleep study Please precert and schedule pt.  Thank You,  Dan Europe

## 2021-07-07 NOTE — Patient Instructions (Signed)
Medication Instructions:  Your physician recommends that you continue on your current medications as directed. Please refer to the Current Medication list given to you today.  *If you need a refill on your cardiac medications before your next appointment, please call your pharmacy*   Lab Work: None ordered.  If you have labs (blood work) drawn today and your tests are completely normal, you will receive your results only by: [place] (if you have MyChart) OR A paper copy in the mail If you have any lab test that is abnormal or we need to change your treatment, we will call you to review the results.   Testing/Procedures: Your physician has recommended that you have a sleep study. This test records several body functions during sleep, including: brain activity, eye movement, oxygen and carbon dioxide blood levels, heart rate and rhythm, breathing rate and rhythm, the flow of air through your mouth and nose, snoring, body muscle movements, and chest and belly movement.    Follow-Up: At [facility], you and your health needs are our priority.  As part of our continuing mission to provide you with exceptional heart care, we have created designated Provider Care Teams.  These Care Teams include your primary Cardiologist (physician) and Advanced Practice Providers (APPs -  Physician Assistants and Nurse Practitioners) who all work together to provide you with the care you need, when you need it.  We recommend signing up for the patient portal called "MyChart".  Sign up information is provided on this After Visit Summary.  MyChart is used to connect with patients for Virtual Visits (Telemedicine).  Patients are able to view lab/test results, encounter notes, upcoming appointments, etc.  Non-urgent messages can be sent to your provider as well.   To learn more about what you can do with MyChart, go to [email or web address].    Your next appointment:   12 month(s)  The format for your  next appointment:   In Person  Provider:   Virl Axe, MD

## 2021-07-07 NOTE — Telephone Encounter (Signed)
Pt is returning call.  

## 2021-07-08 NOTE — Telephone Encounter (Signed)
Patient informed. Copy sent to PCP °

## 2021-07-10 LAB — CUP PACEART INCLINIC DEVICE CHECK
Battery Remaining Longevity: 56 mo
Battery Voltage: 3 V
Brady Statistic AP VP Percent: 0.07 %
Brady Statistic AP VS Percent: 0.01 %
Brady Statistic AS VP Percent: 99.88 %
Brady Statistic AS VS Percent: 0.04 %
Brady Statistic RA Percent Paced: 0.08 %
Brady Statistic RV Percent Paced: 99.6 %
Date Time Interrogation Session: 20221031154800
HighPow Impedance: 63 Ohm
Implantable Lead Implant Date: 20170206
Implantable Lead Implant Date: 20170206
Implantable Lead Implant Date: 20170206
Implantable Lead Location: 753858
Implantable Lead Location: 753859
Implantable Lead Location: 753860
Implantable Lead Model: 4398
Implantable Lead Model: 5076
Implantable Pulse Generator Implant Date: 20220520
Lead Channel Impedance Value: 1064 Ohm
Lead Channel Impedance Value: 1140 Ohm
Lead Channel Impedance Value: 1292 Ohm
Lead Channel Impedance Value: 1368 Ohm
Lead Channel Impedance Value: 260.571
Lead Channel Impedance Value: 279.484
Lead Channel Impedance Value: 287.631
Lead Channel Impedance Value: 341.479
Lead Channel Impedance Value: 374.709
Lead Channel Impedance Value: 456 Ohm
Lead Channel Impedance Value: 456 Ohm
Lead Channel Impedance Value: 551 Ohm
Lead Channel Impedance Value: 608 Ohm
Lead Channel Impedance Value: 608 Ohm
Lead Channel Impedance Value: 722 Ohm
Lead Channel Impedance Value: 779 Ohm
Lead Channel Impedance Value: 893 Ohm
Lead Channel Impedance Value: 931 Ohm
Lead Channel Pacing Threshold Amplitude: 0.375 V
Lead Channel Pacing Threshold Amplitude: 0.5 V
Lead Channel Pacing Threshold Amplitude: 3 V
Lead Channel Pacing Threshold Pulse Width: 0.4 ms
Lead Channel Pacing Threshold Pulse Width: 0.4 ms
Lead Channel Pacing Threshold Pulse Width: 1 ms
Lead Channel Sensing Intrinsic Amplitude: 1.125 mV
Lead Channel Sensing Intrinsic Amplitude: 10.125 mV
Lead Channel Sensing Intrinsic Amplitude: 11.5 mV
Lead Channel Sensing Intrinsic Amplitude: 2.125 mV
Lead Channel Setting Pacing Amplitude: 1.5 V
Lead Channel Setting Pacing Amplitude: 2 V
Lead Channel Setting Pacing Amplitude: 3.75 V
Lead Channel Setting Pacing Pulse Width: 0.4 ms
Lead Channel Setting Pacing Pulse Width: 1 ms
Lead Channel Setting Sensing Sensitivity: 0.3 mV

## 2021-07-11 ENCOUNTER — Telehealth: Payer: Self-pay | Admitting: *Deleted

## 2021-07-11 NOTE — Telephone Encounter (Signed)
This encounter was created in error - please disregard.

## 2021-07-11 NOTE — Telephone Encounter (Signed)
Prior Authorization for split night sent to [place]/Medicaid via Phone. Reference # [id number].  NO PA REQUIRED

## 2021-07-11 NOTE — Telephone Encounter (Signed)
-----   Message from Thora Lance, RN sent at 07/07/2021  4:31 PM EDT ----- Regarding: slpit night sleep study Please precert and schedule pt.  Thank You,  Dan Europe

## 2021-07-14 ENCOUNTER — Other Ambulatory Visit: Payer: Self-pay | Admitting: Obstetrics and Gynecology

## 2021-07-14 DIAGNOSIS — N951 Menopausal and female climacteric states: Secondary | ICD-10-CM

## 2021-07-14 DIAGNOSIS — N926 Irregular menstruation, unspecified: Secondary | ICD-10-CM

## 2021-07-22 NOTE — Telephone Encounter (Signed)
Patient is scheduled for lab study on 08/11/21. Patient understands her sleep study will be done at [facility]. Patient understands she will receive a sleep packet in a week or so. Patient understands to call if she does not receive the sleep packet in a timely manner. Patient agrees with treatment and thanked me for call.

## 2021-07-25 ENCOUNTER — Ambulatory Visit: Payer: BC Managed Care – PPO | Admitting: Cardiology

## 2021-07-25 ENCOUNTER — Ambulatory Visit (INDEPENDENT_AMBULATORY_CARE_PROVIDER_SITE_OTHER): Payer: BC Managed Care – PPO

## 2021-07-25 DIAGNOSIS — I429 Cardiomyopathy, unspecified: Secondary | ICD-10-CM

## 2021-07-25 LAB — CUP PACEART REMOTE DEVICE CHECK
Battery Remaining Longevity: 53 mo
Battery Voltage: 3 V
Brady Statistic AP VP Percent: 0.1 %
Brady Statistic AP VS Percent: 0.01 %
Brady Statistic AS VP Percent: 99.85 %
Brady Statistic AS VS Percent: 0.03 %
Brady Statistic RA Percent Paced: 0.11 %
Brady Statistic RV Percent Paced: 99.76 %
Date Time Interrogation Session: 20221118022603
HighPow Impedance: 61 Ohm
Implantable Lead Implant Date: 20170206
Implantable Lead Implant Date: 20170206
Implantable Lead Implant Date: 20170206
Implantable Lead Location: 753858
Implantable Lead Location: 753859
Implantable Lead Location: 753860
Implantable Lead Model: 4398
Implantable Lead Model: 5076
Implantable Pulse Generator Implant Date: 20220520
Lead Channel Impedance Value: 1064 Ohm
Lead Channel Impedance Value: 1140 Ohm
Lead Channel Impedance Value: 1197 Ohm
Lead Channel Impedance Value: 1254 Ohm
Lead Channel Impedance Value: 262.946
Lead Channel Impedance Value: 277.083
Lead Channel Impedance Value: 286.508
Lead Channel Impedance Value: 324.377
Lead Channel Impedance Value: 346.164
Lead Channel Impedance Value: 399 Ohm
Lead Channel Impedance Value: 475 Ohm
Lead Channel Impedance Value: 513 Ohm
Lead Channel Impedance Value: 551 Ohm
Lead Channel Impedance Value: 589 Ohm
Lead Channel Impedance Value: 665 Ohm
Lead Channel Impedance Value: 722 Ohm
Lead Channel Impedance Value: 836 Ohm
Lead Channel Impedance Value: 950 Ohm
Lead Channel Pacing Threshold Amplitude: 0.5 V
Lead Channel Pacing Threshold Amplitude: 0.5 V
Lead Channel Pacing Threshold Amplitude: 3.25 V
Lead Channel Pacing Threshold Pulse Width: 0.4 ms
Lead Channel Pacing Threshold Pulse Width: 0.4 ms
Lead Channel Pacing Threshold Pulse Width: 1 ms
Lead Channel Sensing Intrinsic Amplitude: 1 mV
Lead Channel Sensing Intrinsic Amplitude: 1 mV
Lead Channel Sensing Intrinsic Amplitude: 9.625 mV
Lead Channel Sensing Intrinsic Amplitude: 9.625 mV
Lead Channel Setting Pacing Amplitude: 1.5 V
Lead Channel Setting Pacing Amplitude: 2 V
Lead Channel Setting Pacing Amplitude: 3.75 V
Lead Channel Setting Pacing Pulse Width: 0.4 ms
Lead Channel Setting Pacing Pulse Width: 1 ms
Lead Channel Setting Sensing Sensitivity: 0.3 mV

## 2021-07-25 NOTE — Progress Notes (Deleted)
Clinical Summary Mary Pratt is a 50 y.o.female  1.Chronic systolic HF - history of NICM, EF prevoiusly as low as 20%. Most recent echo 06/2021 LVEF 50-55% - 2016 cath normal coronaries -- has CRT-D   2. Chest pain - admit 06/2021 with chest pain - trops neg. EKG was V paced - 2016 cath normal coronaries  - 06/2021 echo LVEF 50-55% - 06/2021 nuclear stress: no ischemia    Past Medical History:  Diagnosis Date   Abnormal pap 9/13   ASCUS + HPV, 11/14 LGSIL   Anxiety    Asthma    Cardiac defibrillator in place    Chronic systolic CHF (congestive heart failure) ([place])    a. 02/2015 Echo: EF 20%, sev dil w/ diff HK, worse @ inf base. Tiv AI, mild MR, mod dil LA, PASP 58mmHg.   Functional dyspepsia    early satiety    GERD with stricture    dilated 5621   HELICOBACTER PYLORI GASTRITIS 01/15/2009   dyspnea, ? reaction to Biaxin/amoxicillin  Pylera 01/15/09 - completed therapy      NICM (nonischemic cardiomyopathy) ([place])    a. 02/2015 Cath: nl cors, EF 15%, mild PAH;  b. 02/2015 Enrolled in VEST trial.   Sickle cell trait ([place])    Situational depression    "son died"     Allergies  Allergen Reactions   Amoxicillin Other (See Comments)    REACTION: Tacycardia   Clarithromycin Hives    Breaks out and gets whelps   Spiriva Respimat [Tiotropium Bromide Monohydrate] Rash     Current Outpatient Medications  Medication Sig Dispense Refill   albuterol (PROVENTIL HFA;VENTOLIN HFA) 108 (90 BASE) MCG/ACT inhaler Inhale into the lungs every 6 (six) hours as needed for wheezing or shortness of breath.     azelastine (ASTELIN) 0.1 % nasal spray Place 2 sprays into both nostrils 2 (two) times daily. Use in each nostril as directed     carvedilol (COREG) 25 MG tablet TAKE 1 TABLET BY MOUTH TWICE A DAY (Patient taking differently: Take 25 mg by mouth 2 (two) times daily with a meal.) 60 tablet 6   dexlansoprazole (DEXILANT) 60 MG capsule Take 60 mg by mouth daily.     fluticasone  (CUTIVATE) 0.05 % cream Apply topically 2 (two) times daily as needed (itching). (Patient not taking: Reported on 07/07/2021)     Fluticasone-Salmeterol (ADVAIR DISKUS) 250-50 MCG/DOSE AEPB Inhale 1 puff into the lungs 2 (two) times daily. 180 each 4   furosemide (LASIX) 20 MG tablet TAKE 1 TABLET BY MOUTH EVERY DAY (Patient taking differently: Take 20 mg by mouth daily.) 30 tablet 6   medroxyPROGESTERone (PROVERA) 5 MG tablet Take one tablet a day now and for 5 days every other month if no spontaneous menses. 15 tablet 3   nitroGLYCERIN (NITROSTAT) 0.4 MG SL tablet Place 1 tablet (0.4 mg total) under the tongue every 5 (five) minutes x 3 doses as needed for chest pain. 25 tablet 12   sacubitril-valsartan (ENTRESTO) 24-26 MG Take 1 tablet by mouth 2 (two) times daily. 60 tablet 6   sertraline (ZOLOFT) 100 MG tablet Take 100 mg by mouth in the morning and at bedtime.     spironolactone (ALDACTONE) 25 MG tablet TAKE 1 TABLET BY MOUTH EVERY DAY (Patient taking differently: Take 25 mg by mouth daily.) 30 tablet 6   sucralfate (CARAFATE) 1 g tablet TAKE 1 TABLET (1 G TOTAL) BY MOUTH 4 (FOUR) TIMES DAILY - WITH MEALS AND  AT BEDTIME. 360 tablet 1   No current facility-administered medications for this visit.     Past Surgical History:  Procedure Laterality Date   BIV ICD GENERATOR CHANGEOUT N/A 01/24/2021   Procedure: BIV ICD GENERATOR CHANGEOUT;  Surgeon: Deboraha Sprang, MD;  Location: [facility];  Service: Cardiovascular;  Laterality: N/A;   CARDIAC CATHETERIZATION N/A 02/26/2015   Procedure: Right/Left Heart Cath and Coronary Angiography;  Surgeon: Troy Sine, MD;  Location: [facility];  Service: Cardiovascular;  Laterality: N/A;   COLPOSCOPY  2013   neg   EP IMPLANTABLE DEVICE N/A 10/14/2015   Procedure: BiV ICD Insertion CRT-D;  Surgeon: Deboraha Sprang, MD;  Location: [facility];  Service: Cardiovascular;  Laterality: N/A;   ESOPHAGOGASTRODUODENOSCOPY (EGD) WITH  ESOPHAGEAL DILATION  11/30/2008   esophageal ring dilated 47 French, H. pylori gastritis   TUBAL LIGATION  1991   WISDOM TOOTH EXTRACTION       Allergies  Allergen Reactions   Amoxicillin Other (See Comments)    REACTION: Tacycardia   Clarithromycin Hives    Breaks out and gets whelps   Spiriva Respimat [Tiotropium Bromide Monohydrate] Rash      Family History  Problem Relation Age of Onset   Diabetes Mother    Hypertension Mother    Hypothyroidism Mother    Lung cancer Maternal Grandmother    Breast cancer Maternal Aunt    Brain cancer Maternal Uncle    Prostate cancer Paternal Uncle    Colon cancer Neg Hx    Colon polyps Neg Hx    Esophageal cancer Neg Hx      Social History Mary Pratt reports that she has never smoked. She has never used smokeless tobacco. Mary Pratt reports current alcohol use.   Review of Systems CONSTITUTIONAL: No weight loss, fever, chills, weakness or fatigue.  HEENT: Eyes: No visual loss, blurred vision, double vision or yellow sclerae.No hearing loss, sneezing, congestion, runny nose or sore throat.  SKIN: No rash or itching.  CARDIOVASCULAR:  RESPIRATORY: No shortness of breath, cough or sputum.  GASTROINTESTINAL: No anorexia, nausea, vomiting or diarrhea. No abdominal pain or blood.  GENITOURINARY: No burning on urination, no polyuria NEUROLOGICAL: No headache, dizziness, syncope, paralysis, ataxia, numbness or tingling in the extremities. No change in bowel or bladder control.  MUSCULOSKELETAL: No muscle, back pain, joint pain or stiffness.  LYMPHATICS: No enlarged nodes. No history of splenectomy.  PSYCHIATRIC: No history of depression or anxiety.  ENDOCRINOLOGIC: No reports of sweating, cold or heat intolerance. No polyuria or polydipsia.  Marland Kitchen   Physical Examination There were no vitals filed for this visit. There were no vitals filed for this visit.  Gen: resting comfortably, no acute distress HEENT: no scleral icterus, pupils  equal round and reactive, no palptable cervical adenopathy,  CV Resp: Clear to auscultation bilaterally GI: abdomen is soft, non-tender, non-distended, normal bowel sounds, no hepatosplenomegaly MSK: extremities are warm, no edema.  Skin: warm, no rash Neuro:  no focal deficits Psych: appropriate affect   Diagnostic Studies     Assessment and Plan        Arnoldo Lenis, M.D., F.A.C.C.

## 2021-07-29 ENCOUNTER — Other Ambulatory Visit (HOSPITAL_COMMUNITY): Payer: Self-pay | Admitting: Obstetrics and Gynecology

## 2021-07-29 DIAGNOSIS — Z1231 Encounter for screening mammogram for malignant neoplasm of breast: Secondary | ICD-10-CM

## 2021-08-04 ENCOUNTER — Other Ambulatory Visit: Payer: Self-pay

## 2021-08-04 ENCOUNTER — Ambulatory Visit (HOSPITAL_COMMUNITY)
Admission: RE | Admit: 2021-08-04 | Discharge: 2021-08-04 | Disposition: A | Discharge status: Home / Self Care | Payer: BC Managed Care – PPO | Source: Ambulatory Visit | Attending: Obstetrics and Gynecology | Admitting: Obstetrics and Gynecology

## 2021-08-04 DIAGNOSIS — Z1231 Encounter for screening mammogram for malignant neoplasm of breast: Secondary | ICD-10-CM | POA: Insufficient documentation

## 2021-08-04 NOTE — Progress Notes (Signed)
Remote ICD transmission.   

## 2021-08-11 ENCOUNTER — Ambulatory Visit: Payer: BC Managed Care – PPO | Attending: Internal Medicine | Admitting: Cardiology

## 2021-08-11 DIAGNOSIS — G4763 Sleep related bruxism: Secondary | ICD-10-CM

## 2021-08-11 DIAGNOSIS — R0683 Snoring: Secondary | ICD-10-CM | POA: Diagnosis present

## 2021-08-18 ENCOUNTER — Ambulatory Visit: Payer: BC Managed Care – PPO | Admitting: Family Medicine

## 2021-08-21 ENCOUNTER — Encounter: Payer: Self-pay | Admitting: Internal Medicine

## 2021-08-22 ENCOUNTER — Encounter: Payer: Self-pay | Admitting: Internal Medicine

## 2021-08-25 NOTE — Procedures (Signed)
° °  Patient Name: Mary Pratt, Mary Pratt Date:08/11/2021 Gender: Female D.O.B: 1971-02-28 Age (years): 49 Referring Provider: Virl Axe Height (inches): 73 Interpreting Physician: Fransico Him MD, ABSM Weight (lbs): 155 RPSGT: Rosebud Poles BMI: 29 MRN: [id number] Neck Size: 15.50  CLINICAL INFORMATION Sleep Study Type: NPSG  Indication for sleep study: N/A  Epworth Sleepiness Score: 17  SLEEP STUDY TECHNIQUE As per the AASM Manual for the Scoring of Sleep and Associated Events v2.3 (April 2016) with a hypopnea requiring 4% desaturations.  The channels recorded and monitored were frontal, central and occipital EEG, electrooculogram (EOG), submentalis EMG (chin), nasal and oral airflow, thoracic and abdominal wall motion, anterior tibialis EMG, snore microphone, electrocardiogram, and pulse oximetry.  MEDICATIONS Medications self-administered by patient taken the night of the study : N/A  SLEEP ARCHITECTURE The study was initiated at 10:26:41 PM and ended at 5:14:28 AM.  Sleep onset time was 22.7 minutes and the sleep efficiency was 80.6%. The total sleep time was 328.5 minutes.  Stage REM latency was 321.5 minutes.  The patient spent 7.15% of the night in stage N1 sleep, 56.93% in stage N2 sleep, 16.74% in stage N3 and 19.2% in REM.  Alpha intrusion was absent.  Supine sleep was 18.10%.  RESPIRATORY PARAMETERS The overall apnea/hypopnea index (AHI) was 2.7 per hour. There were 0 total apneas, including 0 obstructive, 0 central and 0 mixed apneas. There were 15 hypopneas and 0 RERAs.  The AHI during Stage REM sleep was 3.8 per hour.  AHI while supine was 9.1 per hour.  The mean oxygen saturation was 93.61%. The minimum SpO2 during sleep was 87.00%.  moderate snoring was noted during this study.  CARDIAC DATA The 2 lead EKG demonstrated sinus rhythm. The mean heart rate was 65.69 beats per minute. Other EKG findings include: None. LEG MOVEMENT DATA The total PLMS  were 40 with a resulting PLMS index of 7.31. Associated arousal with leg movement index was 2.2 .  IMPRESSIONS - No significant obstructive sleep apnea occurred during this study (AHI = 2.7/h). - Mild oxygen desaturation was noted during this study (Min O2 = 87.00%). - The patient snored with moderate snoring volume. - No cardiac abnormalities were noted during this study. - Mild periodic limb movements of sleep occurred during the study. No significant associated arousals.  DIAGNOSIS - Bruxism (G47.63) - Snoring  RECOMMENDATIONS - Consider oral bite guard for bruxism - Evaluate for possible symptoms of restless legs syndrome.  - Avoid alcohol, sedatives and other CNS depressants that may worsen sleep apnea and disrupt normal sleep architecture. - Sleep hygiene should be reviewed to assess factors that may improve sleep quality. - Weight management and regular exercise should be initiated or continued if appropriate. - Consider referral to ENT for evaulation of surgical causes of snoring.   [Electronically signed] 08/25/2021 10:56 AM  Fransico Him MD, ABSM Diplomate, American Board of Sleep Medicine

## 2021-08-29 ENCOUNTER — Telehealth: Payer: Self-pay | Admitting: *Deleted

## 2021-08-29 NOTE — Telephone Encounter (Signed)
-----   Message from Sueanne Margarita, MD sent at 08/25/2021 11:02 AM EST ----- Please let patient know that sleep study showed no significant sleep apnea.  She does have snoring and leg movements as well as teeth grinding - needs to discuss with Dr. Caryl Comes referral to ENT for surgical causes of snoring and consider referral to dentistry for mouth guard for teeth grinding

## 2021-08-29 NOTE — Telephone Encounter (Signed)
The patient has been notified of the result and verbalized understanding.  All questions (if any) were answered. Mary Pratt, [place] 08/29/2021 12:05 PM    Gave patient recommendation made by Dr. Radford Pax and  understanding was verbalized.  Patient understands she should discuss her referral to ENT with dr Caryl Comes and she says once she gets her braces off she will get a mouth guard for the teeth grinding.

## 2021-09-07 HISTORY — PX: WRIST SURGERY: SHX841

## 2021-09-30 ENCOUNTER — Other Ambulatory Visit: Payer: Self-pay | Admitting: *Deleted

## 2021-09-30 ENCOUNTER — Other Ambulatory Visit: Payer: Self-pay | Admitting: Cardiology

## 2021-09-30 DIAGNOSIS — I5022 Chronic systolic (congestive) heart failure: Secondary | ICD-10-CM

## 2021-09-30 MED ORDER — SPIRONOLACTONE 25 MG PO TABS: 25.0000 mg | ORAL_TABLET | Freq: Every day | ORAL | 2 refills | Status: DC

## 2021-10-06 ENCOUNTER — Other Ambulatory Visit: Payer: Self-pay

## 2021-10-06 DIAGNOSIS — I5022 Chronic systolic (congestive) heart failure: Secondary | ICD-10-CM

## 2021-10-08 ENCOUNTER — Other Ambulatory Visit: Payer: Self-pay | Admitting: *Deleted

## 2021-10-08 DIAGNOSIS — I5022 Chronic systolic (congestive) heart failure: Secondary | ICD-10-CM

## 2021-10-08 MED ORDER — FUROSEMIDE 20 MG PO TABS: 20.0000 mg | ORAL_TABLET | Freq: Every day | ORAL | 6 refills | Status: DC

## 2021-10-14 ENCOUNTER — Emergency Department (HOSPITAL_COMMUNITY): Payer: BC Managed Care – PPO

## 2021-10-14 ENCOUNTER — Other Ambulatory Visit: Payer: Self-pay

## 2021-10-14 ENCOUNTER — Encounter (HOSPITAL_COMMUNITY): Payer: Self-pay

## 2021-10-14 ENCOUNTER — Emergency Department (HOSPITAL_COMMUNITY)
Admission: EM | Admit: 2021-10-14 | Discharge: 2021-10-14 | Disposition: A | Discharge status: Home / Self Care | Payer: BC Managed Care – PPO | Attending: Emergency Medicine | Admitting: Emergency Medicine

## 2021-10-14 DIAGNOSIS — R1084 Generalized abdominal pain: Secondary | ICD-10-CM | POA: Diagnosis present

## 2021-10-14 DIAGNOSIS — R110 Nausea: Secondary | ICD-10-CM | POA: Insufficient documentation

## 2021-10-14 DIAGNOSIS — R1030 Lower abdominal pain, unspecified: Secondary | ICD-10-CM | POA: Insufficient documentation

## 2021-10-14 DIAGNOSIS — R102 Pelvic and perineal pain: Secondary | ICD-10-CM | POA: Diagnosis not present

## 2021-10-14 LAB — URINALYSIS, ROUTINE W REFLEX MICROSCOPIC
Bilirubin Urine: NEGATIVE
Glucose, UA: NEGATIVE mg/dL
Ketones, ur: NEGATIVE mg/dL
Leukocytes,Ua: NEGATIVE
Nitrite: NEGATIVE
Protein, ur: NEGATIVE mg/dL
Specific Gravity, Urine: 1.011 (ref 1.005–1.030)
pH: 7 (ref 5.0–8.0)

## 2021-10-14 LAB — CBC WITH DIFFERENTIAL/PLATELET
Abs Immature Granulocytes: 0.01 10*3/uL (ref 0.00–0.07)
Basophils Absolute: 0 10*3/uL (ref 0.0–0.1)
Basophils Relative: 0 %
Eosinophils Absolute: 0.2 10*3/uL (ref 0.0–0.5)
Eosinophils Relative: 5 %
HCT: 36.8 % (ref 36.0–46.0)
Hemoglobin: 12.3 g/dL (ref 12.0–15.0)
Immature Granulocytes: 0 %
Lymphocytes Relative: 41 %
Lymphs Abs: 2.1 10*3/uL (ref 0.7–4.0)
MCH: 28.7 pg (ref 26.0–34.0)
MCHC: 33.4 g/dL (ref 30.0–36.0)
MCV: 86 fL (ref 80.0–100.0)
Monocytes Absolute: 0.4 10*3/uL (ref 0.1–1.0)
Monocytes Relative: 8 %
Neutro Abs: 2.3 10*3/uL (ref 1.7–7.7)
Neutrophils Relative %: 46 %
Platelets: 243 10*3/uL (ref 150–400)
RBC: 4.28 MIL/uL (ref 3.87–5.11)
RDW: 14.7 % (ref 11.5–15.5)
WBC: 5.1 10*3/uL (ref 4.0–10.5)
nRBC: 0 % (ref 0.0–0.2)

## 2021-10-14 LAB — COMPREHENSIVE METABOLIC PANEL
ALT: 11 U/L (ref 0–44)
AST: 24 U/L (ref 15–41)
Albumin: 3.8 g/dL (ref 3.5–5.0)
Alkaline Phosphatase: 62 U/L (ref 38–126)
Anion gap: 8 (ref 5–15)
BUN: 12 mg/dL (ref 6–20)
CO2: 26 mmol/L (ref 22–32)
Calcium: 9.3 mg/dL (ref 8.9–10.3)
Chloride: 103 mmol/L (ref 98–111)
Creatinine, Ser: 0.85 mg/dL (ref 0.44–1.00)
GFR, Estimated: >60 mL/min (ref >60)
Glucose, Bld: 109 mg/dL — ABNORMAL HIGH (ref 70–99)
Potassium: 4.5 mmol/L (ref 3.5–5.1)
Sodium: 137 mmol/L (ref 135–145)
Total Bilirubin: 1.2 mg/dL (ref 0.3–1.2)
Total Protein: 7.6 g/dL (ref 6.5–8.1)

## 2021-10-14 LAB — HCG, QUANTITATIVE, PREGNANCY: hCG, Beta Chain, Quant, S: 1 m[IU]/mL (ref <5)

## 2021-10-14 LAB — LIPASE, BLOOD: Lipase: 31 U/L (ref 11–51)

## 2021-10-14 MED ORDER — ACETAMINOPHEN 325 MG PO TABS
650.0000 mg | ORAL_TABLET | Freq: Once | ORAL | Status: AC
  Administered 2021-10-14: 650 mg via ORAL
  Filled 2021-10-14: qty 2

## 2021-10-14 NOTE — ED Provider Notes (Signed)
Mary Pratt   CSN: [id number] Arrival date & time: 10/14/21  [id number]     History Chief Complaint  Patient presents with   Abdominal Pain    Mary Pratt is a 51 y.o. female who presents to the emergency department for further evaluation of abdominal pain that started yesterday.  Patient states that Sunday afternoon patient had some fluid come out of her vagina followed by vaginal spotting.  Patient has not had a period in 3 months.  Starting yesterday patient began having diffuse abdominal pain primarily in the lower abdomen.  She reports associated nausea but denies any vomiting or diarrhea. She denies any urinary complaints. No fever or chills. No cough, sore throat, or nasal congestion. Rates pain 5/10 in severity.    Abdominal Pain     Home Medications Prior to Admission medications   Medication Sig Start Date End Date Taking? Authorizing Provider  albuterol (PROVENTIL HFA;VENTOLIN HFA) 108 (90 BASE) MCG/ACT inhaler Inhale into the lungs every 6 (six) hours as needed for wheezing or shortness of breath.    [provider]  azelastine (ASTELIN) 0.1 % nasal spray Place 2 sprays into both nostrils 2 (two) times daily. Use in each nostril as directed    [provider]  carvedilol (COREG) 25 MG tablet Take 1 tablet (25 mg total) by mouth 2 (two) times daily with a meal. 09/30/21   Branch, Alphonse Guild, MD  dexlansoprazole (DEXILANT) 60 MG capsule Take 60 mg by mouth daily.    [provider]  fluticasone (CUTIVATE) 0.05 % cream Apply topically 2 (two) times daily as needed (itching). Patient not taking: Reported on 07/07/2021 04/27/21   [provider]  Fluticasone-Salmeterol (ADVAIR DISKUS) 250-50 MCG/DOSE AEPB Inhale 1 puff into the lungs 2 (two) times daily. 03/23/13   [name], Armando Reichert, MD  furosemide (LASIX) 20 MG tablet Take 1 tablet (20 mg total) by mouth daily. 10/08/21   Arnoldo Lenis, MD   medroxyPROGESTERone (PROVERA) 5 MG tablet Take one tablet a day now and for 5 days every other month if no spontaneous menses. 07/01/21   Salvadore Dom, MD  nitroGLYCERIN (NITROSTAT) 0.4 MG SL tablet Place 1 tablet (0.4 mg total) under the tongue every 5 (five) minutes x 3 doses as needed for chest pain. 06/25/21   Bhagat, Bhavinkumar, PA  sacubitril-valsartan (ENTRESTO) 24-26 MG Take 1 tablet by mouth 2 (two) times daily. 02/14/21   Verta Ellen., NP  sertraline (ZOLOFT) 100 MG tablet Take 100 mg by mouth in the morning and at bedtime. 01/24/20   [provider]  spironolactone (ALDACTONE) 25 MG tablet Take 1 tablet (25 mg total) by mouth daily. 09/30/21   Arnoldo Lenis, MD  sucralfate (CARAFATE) 1 g tablet TAKE 1 TABLET (1 G TOTAL) BY MOUTH 4 (FOUR) TIMES DAILY - WITH MEALS AND AT BEDTIME. 06/27/21   Gatha Mayer, MD      Allergies    Amoxicillin, Clarithromycin, and Spiriva respimat [tiotropium bromide monohydrate]    Review of Systems   Review of Systems  Gastrointestinal:  Positive for abdominal pain.  All other systems reviewed and are negative.  Physical Exam Updated Vital Signs BP 116/85    Pulse 60    Temp 97.8 F (36.6 C) (Oral)    Resp 16    SpO2 96%  Physical Exam Vitals and nursing Pratt reviewed.  Constitutional:      General: She is not in acute distress.  Appearance: Normal appearance.  HENT:     Head: Normocephalic and atraumatic.  Eyes:     General:        Right eye: No discharge.        Left eye: No discharge.  Cardiovascular:     Comments: Regular rate and rhythm.  S1/S2 are distinct without any evidence of murmur, rubs, or gallops.  Radial pulses are 2+ bilaterally.  Dorsalis pedis pulses are 2+ bilaterally.  No evidence of pedal edema. Pulmonary:     Comments: Clear to auscultation bilaterally.  Normal effort.  No respiratory distress.  No evidence of wheezes, rales, or rhonchi heard throughout. Abdominal:     General: Abdomen is  flat. Bowel sounds are normal. There is no distension.     Tenderness: There is no guarding or rebound.     Comments: Minimal diffuse abdominal tenderness.   Musculoskeletal:        General: Normal range of motion.     Cervical back: Neck supple.  Skin:    General: Skin is warm and dry.     Findings: No rash.  Neurological:     General: No focal deficit present.     Mental Status: She is alert.  Psychiatric:        Mood and Affect: Mood normal.        Behavior: Behavior normal.    ED Results / Procedures / Treatments   Labs (all labs ordered are listed, but only abnormal results are displayed) Labs Reviewed  COMPREHENSIVE METABOLIC PANEL - Abnormal; Notable for the following components:      Result Value   Glucose, Bld 109 (*)    All other components within normal limits  URINALYSIS, ROUTINE W REFLEX MICROSCOPIC - Abnormal; Notable for the following components:   APPearance CLOUDY (*)    Hgb urine dipstick LARGE (*)    Bacteria, UA RARE (*)    All other components within normal limits  LIPASE, BLOOD  CBC WITH DIFFERENTIAL/PLATELET  HCG, QUANTITATIVE, PREGNANCY    EKG None  Radiology US PELVIC COMPLETE W TRANSVAGINAL AND TORSION R/O  Result Date: 10/14/2021 CLINICAL DATA:  Pelvic pain EXAM: TRANSABDOMINAL AND TRANSVAGINAL ULTRASOUND OF PELVIS DOPPLER ULTRASOUND OF OVARIES TECHNIQUE: Both transabdominal and transvaginal ultrasound examinations of the pelvis were performed. Transabdominal technique was performed for global imaging of the pelvis including uterus, ovaries, adnexal regions, and pelvic cul-de-sac. It was necessary to proceed with endovaginal exam following the transabdominal exam to visualize the ovaries. Color and duplex Doppler ultrasound was utilized to evaluate blood flow to the ovaries. COMPARISON:  05/16/2021 FINDINGS: Uterus Measurements: 8.5 x 4.0 x 5.0 cm = volume: 90 mL. Anteverted. No fibroids or other mass visualized. Small nabothian cysts at the level of  the cervix. Endometrium Thickness: 2.5 mm.  No focal abnormality visualized. Right ovary Measurements: 2.7 x 1.6 x 2.2 cm = volume: 5 mL. Normal appearance/no adnexal mass. Left ovary Measurements: 2.1 x 1.2 x 1.6 cm = volume: 2 mL. Normal appearance/no adnexal mass. Pulsed Doppler evaluation of both ovaries demonstrates normal low-resistance arterial and venous waveforms. Other findings No abnormal free fluid. IMPRESSION: Essentially unremarkable ultrasound of the pelvis. Negative for adnexal torsion. Electronically Signed   By: Davina Poke D.O.   On: 10/14/2021 08:21    Procedures Procedures  Cardiac telemetry reveals normal sinus rhythm without any evidence of respiratory compromise. Oxygenating well on room air.  Medications Ordered in ED Medications  acetaminophen (TYLENOL) tablet 650 mg (650 mg Oral Given 10/14/21  Urania.Dibbles)    ED Course/ Medical Decision Making/ A&P                           Medical Decision Making Amount and/or Complexity of Data Reviewed Labs: ordered. Radiology: ordered. ECG/medicine tests: ordered.  Risk OTC drugs.   Mary Pratt is a 51 y.o. female with significant morbidities impacting care today including nonischemic cardiomyopathy, H. pylori gastritis who presents to the emergency department for lower abdominal pain and vaginal discharge.  Patient does endorse a history of cysts and states this feels similar.  Other things on the differential include pregnancy as patient is perimenopausal, cystitis, torsion however I do feel this is less likely, ruptured ovarian cyst.  I considered however do feel appendicitis and diverticulitis are less likely at this time.  Old records were reviewed including colonoscopy that was performed late last year which was normal per report.  No evidence of diverticula or polyps.  Patient also had endoscopy late last year which was also normal.  Patient is in no acute distress with normal vital signs.  Patient has minimal diffuse  abdominal tenderness.  Given her vaginal symptoms likely evaluated with a pelvic ultrasound.  We will also obtain basic labs.  Will give Tylenol for pain. Will plan to reassess.   I personally reviewed the labs and imaging.  CBC without evidence of leukocytosis.  No anemia.  CMP shows no significant electrolyte abnormalities.  No transaminitis.  Urinalysis without infection.  Lipase negative.  I doubt pancreatitis.  Patient is not pregnant.  Pelvic ultrasound not reveal any acute abnormalities.  No active ovarian cyst rupture.  No torsion.  I do agree with the radiologist interpretation.  On reevaluation, patient is feeling slightly improved.  Still could be a resolving ovarian cyst rupture.  I believe the patient is safe for outpatient follow-up.  I will instruct her to take ibuprofen as this is good for pelvic and menstrual cramping.  Patient does have an OB/GYN which I will have her follow-up with them.  Strict return precautions given.  All questions and concerns addressed at bedside.  She is safe for discharge.   Final Clinical Impression(s) / ED Diagnoses Final diagnoses:  Pelvic pain  Lower abdominal pain    Rx / DC Orders ED Discharge Orders     None         Hendricks Limes, [place] 10/14/21 [id number]    Lajean Saver, MD 10/14/21 [id number]

## 2021-10-14 NOTE — ED Triage Notes (Addendum)
Pt complains of lower left abdominal pain x 2 days. Pt states that she feels that an ovarian cyst has ruptured. Pt states that she has been spotting and she hasn't had a period in 3 months.

## 2021-10-14 NOTE — Discharge Instructions (Signed)
Please take 600 mg of ibuprofen every 6 hours.  This will help with the pelvic cramping.  Please follow-up with your OB/GYN.  Return to the emergency department for worsening symptoms.

## 2021-10-24 ENCOUNTER — Telehealth: Payer: Self-pay

## 2021-10-24 ENCOUNTER — Ambulatory Visit (INDEPENDENT_AMBULATORY_CARE_PROVIDER_SITE_OTHER): Payer: BC Managed Care – PPO

## 2021-10-24 DIAGNOSIS — I429 Cardiomyopathy, unspecified: Secondary | ICD-10-CM

## 2021-10-24 DIAGNOSIS — I5022 Chronic systolic (congestive) heart failure: Secondary | ICD-10-CM

## 2021-10-24 LAB — CUP PACEART REMOTE DEVICE CHECK
Battery Remaining Longevity: 53 mo
Battery Voltage: 2.99 V
Brady Statistic AP VP Percent: 0.13 %
Brady Statistic AP VS Percent: 0.01 %
Brady Statistic AS VP Percent: 99.79 %
Brady Statistic AS VS Percent: 0.07 %
Brady Statistic RA Percent Paced: 0.14 %
Brady Statistic RV Percent Paced: 99.66 %
Date Time Interrogation Session: 20230217012305
HighPow Impedance: 66 Ohm
Implantable Lead Implant Date: 20170206
Implantable Lead Implant Date: 20170206
Implantable Lead Implant Date: 20170206
Implantable Lead Location: 753858
Implantable Lead Location: 753859
Implantable Lead Location: 753860
Implantable Lead Model: 4398
Implantable Lead Model: 5076
Implantable Pulse Generator Implant Date: 20220520
Lead Channel Impedance Value: 1026 Ohm
Lead Channel Impedance Value: 1140 Ohm
Lead Channel Impedance Value: 1178 Ohm
Lead Channel Impedance Value: 1349 Ohm
Lead Channel Impedance Value: 1368 Ohm
Lead Channel Impedance Value: 276.59 Ohm
Lead Channel Impedance Value: 287.631
Lead Channel Impedance Value: 287.631
Lead Channel Impedance Value: 369.526
Lead Channel Impedance Value: 389.5 Ohm
Lead Channel Impedance Value: 418 Ohm
Lead Channel Impedance Value: 456 Ohm
Lead Channel Impedance Value: 513 Ohm
Lead Channel Impedance Value: 608 Ohm
Lead Channel Impedance Value: 703 Ohm
Lead Channel Impedance Value: 779 Ohm
Lead Channel Impedance Value: 779 Ohm
Lead Channel Impedance Value: 950 Ohm
Lead Channel Pacing Threshold Amplitude: 0.5 V
Lead Channel Pacing Threshold Amplitude: 0.5 V
Lead Channel Pacing Threshold Amplitude: 3 V
Lead Channel Pacing Threshold Pulse Width: 0.4 ms
Lead Channel Pacing Threshold Pulse Width: 0.4 ms
Lead Channel Pacing Threshold Pulse Width: 1 ms
Lead Channel Sensing Intrinsic Amplitude: 0.5 mV
Lead Channel Sensing Intrinsic Amplitude: 0.5 mV
Lead Channel Sensing Intrinsic Amplitude: 10.5 mV
Lead Channel Sensing Intrinsic Amplitude: 10.5 mV
Lead Channel Setting Pacing Amplitude: 1.5 V
Lead Channel Setting Pacing Amplitude: 2 V
Lead Channel Setting Pacing Amplitude: 3.75 V
Lead Channel Setting Pacing Pulse Width: 0.4 ms
Lead Channel Setting Pacing Pulse Width: 1 ms
Lead Channel Setting Sensing Sensitivity: 0.3 mV

## 2021-10-24 NOTE — Telephone Encounter (Signed)
Scheduled remote reviewed. Normal device function.   1 AF episode 1/23, duration 1hr 78min Burden <0.1%, no hx of AF noted, route for awareness Next remote 91 days. LA  Successful telephone encounter to patient to assess for S/S AF 09/29/21. Patient states although it was a month ago she remembers her heart "flipping around". She is compliant with all medications including coreg. Routing to Dr. Caryl Comes for advisement as this appears to be new onset. No OAC.

## 2021-10-29 NOTE — Progress Notes (Signed)
Remote ICD transmission.   

## 2021-11-12 ENCOUNTER — Encounter: Payer: Self-pay | Admitting: Internal Medicine

## 2021-11-13 NOTE — Telephone Encounter (Signed)
Transmission reviewed. Normal device function. No episodes noted.  ? ? ? ? ? ? ? ? ? ? ? ? ?

## 2021-11-13 NOTE — Telephone Encounter (Signed)
Spoke with pt and advised of normal device function with no episodes note.  Pt advised due to active chest pain and fatigue would recommend she seek further evaluation in the ED.  Pt verbalizes understanding and agrees with current plan. ?

## 2021-12-19 ENCOUNTER — Encounter: Payer: Self-pay | Admitting: Obstetrics and Gynecology

## 2021-12-27 ENCOUNTER — Other Ambulatory Visit: Payer: Self-pay | Admitting: Cardiology

## 2021-12-27 DIAGNOSIS — I5022 Chronic systolic (congestive) heart failure: Secondary | ICD-10-CM

## 2022-01-05 ENCOUNTER — Other Ambulatory Visit: Payer: Self-pay

## 2022-01-05 MED ORDER — SACUBITRIL-VALSARTAN 24-26 MG PO TABS: 1.0000 | ORAL_TABLET | Freq: Two times a day (BID) | ORAL | 6 refills | Status: DC

## 2022-01-12 ENCOUNTER — Telehealth: Payer: Self-pay | Admitting: Internal Medicine
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
## 2022-01-12 NOTE — Telephone Encounter (Signed)
?  1. Has your device fired? no ? ?2. Is you device beeping? no ? ?3. Are you experiencing draining or swelling at device site? no ? ?4. Are you calling to see if we received your device transmission? yes ? ?5. Have you passed out? no ? ?Patient said she sent a transmission because she was not feeling well over the weekend. She would like to know if anything abnormal showed up ? ?Please route to Device Clinic Pool  ?

## 2022-01-14 ENCOUNTER — Emergency Department (HOSPITAL_COMMUNITY): Payer: BC Managed Care – PPO

## 2022-01-14 ENCOUNTER — Other Ambulatory Visit: Payer: Self-pay

## 2022-01-14 ENCOUNTER — Emergency Department (HOSPITAL_COMMUNITY)
Admission: EM | Admit: 2022-01-14 | Discharge: 2022-01-14 | Disposition: A | Discharge status: Home / Self Care | Payer: BC Managed Care – PPO | Attending: Emergency Medicine | Admitting: Emergency Medicine

## 2022-01-14 ENCOUNTER — Encounter (HOSPITAL_COMMUNITY): Payer: Self-pay

## 2022-01-14 DIAGNOSIS — Z79899 Other long term (current) drug therapy: Secondary | ICD-10-CM | POA: Diagnosis not present

## 2022-01-14 DIAGNOSIS — M79601 Pain in right arm: Secondary | ICD-10-CM | POA: Insufficient documentation

## 2022-01-14 DIAGNOSIS — M25511 Pain in right shoulder: Secondary | ICD-10-CM | POA: Insufficient documentation

## 2022-01-14 MED ORDER — LIDOCAINE 5 % EX PTCH
1.0000 | MEDICATED_PATCH | CUTANEOUS | Status: DC
  Administered 2022-01-14: 1 via TRANSDERMAL
  Filled 2022-01-14: qty 1

## 2022-01-14 MED ORDER — LIDOCAINE 5 % EX PTCH: 1.0000 | MEDICATED_PATCH | CUTANEOUS | 0 refills | Status: DC

## 2022-01-14 NOTE — ED Provider Notes (Signed)
[place] Provider Note   CSN: [id number] Arrival date & time: 01/14/22  1608     History  Chief Complaint  Patient presents with   Arm Pain    Mary Pratt is a 51 y.o. female.   Arm Pain  Patient presents with right shoulder pain.  This happened after a fall on Friday, she landed on her shoulder denies hitting her head.  The pain is worse in the evenings, also worse with any flexion or extension of the right arm.  Feels like a sharp pain, it radiates from her shoulder down to her elbow.  She has tried Tylenol with some improvement of pain.    Home Medications Prior to Admission medications   Medication Sig Start Date End Date Taking? Authorizing Provider  albuterol (PROVENTIL HFA;VENTOLIN HFA) 108 (90 BASE) MCG/ACT inhaler Inhale into the lungs every 6 (six) hours as needed for wheezing or shortness of breath.    [provider]  azelastine (ASTELIN) 0.1 % nasal spray Place 2 sprays into both nostrils 2 (two) times daily. Use in each nostril as directed    [provider]  carvedilol (COREG) 25 MG tablet TAKE 1 TABLET (25 MG TOTAL) BY MOUTH TWICE A DAY WITH MEALS 12/29/21   Arnoldo Lenis, MD  dexlansoprazole (DEXILANT) 60 MG capsule Take 60 mg by mouth daily.    [provider]  fluticasone (CUTIVATE) 0.05 % cream Apply topically 2 (two) times daily as needed (itching). Patient not taking: Reported on 07/07/2021 04/27/21   [provider]  Fluticasone-Salmeterol (ADVAIR DISKUS) 250-50 MCG/DOSE AEPB Inhale 1 puff into the lungs 2 (two) times daily. 03/23/13   [name], Armando Reichert, MD  furosemide (LASIX) 20 MG tablet Take 1 tablet (20 mg total) by mouth daily. 10/08/21   Arnoldo Lenis, MD  medroxyPROGESTERone (PROVERA) 5 MG tablet Take one tablet a day now and for 5 days every other month if no spontaneous menses. 07/01/21   Salvadore Dom, MD  nitroGLYCERIN (NITROSTAT) 0.4 MG SL tablet Place 1 tablet (0.4 mg total)  under the tongue every 5 (five) minutes x 3 doses as needed for chest pain. 06/25/21   Bhagat, Bhavinkumar, PA  sacubitril-valsartan (ENTRESTO) 24-26 MG Take 1 tablet by mouth 2 (two) times daily. 01/05/22   Arnoldo Lenis, MD  sertraline (ZOLOFT) 100 MG tablet Take 100 mg by mouth in the morning and at bedtime. 01/24/20   [provider]  spironolactone (ALDACTONE) 25 MG tablet Take 1 tablet (25 mg total) by mouth daily. 09/30/21   Arnoldo Lenis, MD  sucralfate (CARAFATE) 1 g tablet TAKE 1 TABLET (1 G TOTAL) BY MOUTH 4 (FOUR) TIMES DAILY - WITH MEALS AND AT BEDTIME. 06/27/21   Gatha Mayer, MD      Allergies    Amoxicillin, Clarithromycin, Meloxicam, and Spiriva respimat [tiotropium bromide monohydrate]    Review of Systems   Review of Systems  Physical Exam Updated Vital Signs BP (!) 127/91 (BP Location: Left Arm)   Pulse 60   Temp 97.8 F (36.6 C) (Oral)   Resp 20   Ht '5\' 1"'$  (1.549 m)   Wt 74.9 kg   LMP 12/31/2021   SpO2 96%   BMI 31.21 kg/m  Physical Exam Vitals and nursing note reviewed. Exam conducted with a chaperone present.  Constitutional:      General: She is not in acute distress.    Appearance: Normal appearance.  HENT:     Head:  Normocephalic and atraumatic.  Eyes:     General: No scleral icterus.    Extraocular Movements: Extraocular movements intact.     Pupils: Pupils are equal, round, and reactive to light.  Cardiovascular:     Pulses: Normal pulses.  Musculoskeletal:        General: Tenderness present.     Comments: No focal tenderness or crepitus.  Patient has good ROM to elbow wrist.  She does have some pain with flexion extension adduction and abduction of the shoulder but ROM is grossly intact.  Skin:    Capillary Refill: Capillary refill takes less than 2 seconds.     Coloration: Skin is not jaundiced.     Findings: No bruising.     Comments: No contusion or laceration or abrasion noted  Neurological:     Mental Status: She is  alert. Mental status is at baseline.     Coordination: Coordination normal.    ED Results / Procedures / Treatments   Labs (all labs ordered are listed, but only abnormal results are displayed) Labs Reviewed - No data to display  EKG None  Radiology DG Shoulder Right  Result Date: 01/14/2022 CLINICAL DATA:  Right shoulder pain after fall several days ago. EXAM: RIGHT SHOULDER - 2+ VIEW COMPARISON:  None Available. FINDINGS: There is no evidence of fracture or dislocation. There is no evidence of arthropathy or other focal bone abnormality. Soft tissues are unremarkable. IMPRESSION: Negative. Electronically Signed   By: Marijo Conception M.D.   On: 01/14/2022 17:58    Procedures Procedures    Medications Ordered in ED Medications - No data to display  ED Course/ Medical Decision Making/ A&P                           Medical Decision Making  52 year old presenting with right shoulder pain.  She is neurovascularly intact, good ROM although some tenderness with movements.  X-ray ordered and viewed by myself.  There is no signs of fracture or dislocation, I agree with radiologist interpretation.  Considered rotator cuff injury but given roughly intact ROM I think that is less likely.  I suspect likely muscle strain, will provide patient with orthopedic follow-up as needed.  Encourage Lidoderm and Tylenol.  Patient works at [employer] station, she states she is able to do her job despite the pain.        Final Clinical Impression(s) / ED Diagnoses Final diagnoses:  None    Rx / DC Orders ED Discharge Orders     None         Sherrill Raring, Hershal Coria 01/14/22 1954    Luna Fuse, MD 01/23/22 [phone]

## 2022-01-14 NOTE — Discharge Instructions (Addendum)
Continue the Tylenol.  Try the topical Lidoderm patches.  Follow-up with orthopedic if no improvement in pain in the next 2 weeks.  Try doing stretching exercises listed below. ?

## 2022-01-14 NOTE — ED Triage Notes (Signed)
Reports R shoulder pain x since Friday after fall.  No deformity noted.  Resp even and unlabored.  Skin warm and dry.  nad ?

## 2022-01-23 ENCOUNTER — Ambulatory Visit (INDEPENDENT_AMBULATORY_CARE_PROVIDER_SITE_OTHER): Payer: BC Managed Care – PPO

## 2022-01-23 ENCOUNTER — Other Ambulatory Visit (HOSPITAL_COMMUNITY): Payer: Self-pay | Admitting: Orthopedic Surgery

## 2022-01-23 ENCOUNTER — Encounter: Payer: Self-pay | Admitting: Cardiology

## 2022-01-23 ENCOUNTER — Encounter: Payer: Self-pay | Admitting: Internal Medicine

## 2022-01-23 ENCOUNTER — Ambulatory Visit (INDEPENDENT_AMBULATORY_CARE_PROVIDER_SITE_OTHER): Payer: BC Managed Care – PPO | Admitting: Cardiology

## 2022-01-23 ENCOUNTER — Other Ambulatory Visit: Payer: Self-pay | Admitting: Orthopedic Surgery

## 2022-01-23 ENCOUNTER — Encounter: Payer: Self-pay | Admitting: *Deleted

## 2022-01-23 VITALS — BP 106/80 | HR 76 | Ht 61.0 in | Wt 167.6 lb

## 2022-01-23 DIAGNOSIS — I5022 Chronic systolic (congestive) heart failure: Secondary | ICD-10-CM

## 2022-01-23 DIAGNOSIS — I429 Cardiomyopathy, unspecified: Secondary | ICD-10-CM | POA: Diagnosis not present

## 2022-01-23 DIAGNOSIS — R0789 Other chest pain: Secondary | ICD-10-CM | POA: Diagnosis not present

## 2022-01-23 DIAGNOSIS — M79642 Pain in left hand: Secondary | ICD-10-CM

## 2022-01-23 LAB — CUP PACEART REMOTE DEVICE CHECK
Battery Remaining Longevity: 51 mo
Battery Voltage: 2.98 V
Brady Statistic AP VP Percent: 0.03 %
Brady Statistic AP VS Percent: 0.01 %
Brady Statistic AS VP Percent: 99.92 %
Brady Statistic AS VS Percent: 0.04 %
Brady Statistic RA Percent Paced: 0.04 %
Brady Statistic RV Percent Paced: 99.74 %
Date Time Interrogation Session: 20230519012204
HighPow Impedance: 74 Ohm
Implantable Lead Implant Date: 20170206
Implantable Lead Implant Date: 20170206
Implantable Lead Implant Date: 20170206
Implantable Lead Location: 753858
Implantable Lead Location: 753859
Implantable Lead Location: 753860
Implantable Lead Model: 4398
Implantable Lead Model: 5076
Implantable Pulse Generator Implant Date: 20220520
Lead Channel Impedance Value: 1026 Ohm
Lead Channel Impedance Value: 1140 Ohm
Lead Channel Impedance Value: 1178 Ohm
Lead Channel Impedance Value: 1235 Ohm
Lead Channel Impedance Value: 1406 Ohm
Lead Channel Impedance Value: 1425 Ohm
Lead Channel Impedance Value: 292.308
Lead Channel Impedance Value: 300.368
Lead Channel Impedance Value: 302.899
Lead Channel Impedance Value: 398.095
Lead Channel Impedance Value: 413.195
Lead Channel Impedance Value: 456 Ohm
Lead Channel Impedance Value: 475 Ohm
Lead Channel Impedance Value: 589 Ohm
Lead Channel Impedance Value: 608 Ohm
Lead Channel Impedance Value: 760 Ohm
Lead Channel Impedance Value: 817 Ohm
Lead Channel Impedance Value: 836 Ohm
Lead Channel Pacing Threshold Amplitude: 0.5 V
Lead Channel Pacing Threshold Amplitude: 0.625 V
Lead Channel Pacing Threshold Amplitude: 3.5 V
Lead Channel Pacing Threshold Pulse Width: 0.4 ms
Lead Channel Pacing Threshold Pulse Width: 0.4 ms
Lead Channel Pacing Threshold Pulse Width: 1 ms
Lead Channel Sensing Intrinsic Amplitude: 0.875 mV
Lead Channel Sensing Intrinsic Amplitude: 0.875 mV
Lead Channel Sensing Intrinsic Amplitude: 11.125 mV
Lead Channel Sensing Intrinsic Amplitude: 11.125 mV
Lead Channel Setting Pacing Amplitude: 1.5 V
Lead Channel Setting Pacing Amplitude: 2 V
Lead Channel Setting Pacing Amplitude: 3.75 V
Lead Channel Setting Pacing Pulse Width: 0.4 ms
Lead Channel Setting Pacing Pulse Width: 1 ms
Lead Channel Setting Sensing Sensitivity: 0.3 mV

## 2022-01-23 NOTE — Progress Notes (Signed)
Clinical Summary Ms. Buttacavoli is a 51 y.o.female seen today for follow up of the following medical problems.   1.Chronic HFrEF/NICM 08/2015 ehco LVEF 30% 02/2015 RHC/LHC: normal coronaries, mild pulm HTN 03/2015 cMRI: LVEF 31%, no LGE   07/2018 echo LVEF 60-65% 06/2021 echo LVEF 50-55% - CRT-D followed by EP. LVEF seemed to improved after CRT implant 10/2015 10/2021 normal device check. Short afib 1 hr 30 min - no SOB/DOE. Occasional edema - compliant with meds    2.Chest pain - 06/2021 nuclear stress: no ischemia - no recent chest pains.  3. OSA screen 08/11/2021 sleep study was negative.   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 02/2015 Echo: EF 20%, sev dil w/ diff HK, worse @ inf base. Tiv AI, mild MR, mod dil LA, PASP 72mHg.   Functional dyspepsia    early satiety    GERD with stricture    dilated 27939  HELICOBACTER PYLORI GASTRITIS 01/15/2009   dyspnea, ? reaction to Biaxin/amoxicillin  Pylera 01/15/09 - completed therapy      [place]    a. 02/2015 Cath: nl cors, EF 15%, mild PAH;  b. 02/2015 Enrolled in VE[place] trial.   [place]    Situational depression    "son died"     Allergies  Allergen Reactions   Amoxicillin Other (See Comments)    REACTION: Tacycardia   Clarithromycin Hives    Breaks out and gets whelps   Meloxicam    Spiriva Respimat [Tiotropium Bromide Monohydrate] Rash     Current Outpatient Medications  Medication Sig Dispense Refill   albuterol (PROVENTIL HFA;VENTOLIN HFA) 108 (90 BASE) MCG/ACT inhaler Inhale into the lungs every 6 (six) hours as needed for wheezing or shortness of breath.     azelastine (ASTELIN) 0.1 % nasal spray Place 2 sprays into both nostrils 2 (two) times daily. Use  in each nostril as directed     carvedilol (COREG) 25 MG tablet TAKE 1 TABLET (25 MG TOTAL) BY MOUTH TWICE A DAY WITH MEALS 180 tablet 0   dexlansoprazole (DEXILANT) 60 MG capsule Take 60 mg by mouth daily.     fluticasone (CUTIVATE) 0.05 % cream Apply topically 2 (two) times daily as needed (itching). (Patient not taking: Reported on 07/07/2021)     Fluticasone-Salmeterol (ADVAIR DISKUS) 250-50 MCG/DOSE AEPB Inhale 1 puff into the lungs 2 (two) times daily. 180 each 4   furosemide (LASIX) 20 MG tablet Take 1 tablet (20 mg total) by mouth daily. 30 tablet 6   lidocaine (LIDODERM) 5 % Place 1 patch onto the skin daily. Remove & Discard patch within 12 hours or as directed by MD 30 patch 0   medroxyPROGESTERone (PROVERA) 5 MG tablet Take one tablet a day now and for 5 days every other month if no spontaneous menses. 15 tablet 3   nitroGLYCERIN (NITROSTAT) 0.4 MG SL tablet Place 1 tablet (0.4 mg total) under the tongue every 5 (five) minutes x 3 doses as needed for chest pain. 25 tablet 12   sacubitril-valsartan (ENTRESTO) 24-26 MG Take 1 tablet by mouth 2 (two) times daily. 60 tablet 6   sertraline (ZOLOFT) 100 MG tablet Take 100 mg  by mouth in the morning and at bedtime.     spironolactone (ALDACTONE) 25 MG tablet Take 1 tablet (25 mg total) by mouth daily. 90 tablet 2   sucralfate (CARAFATE) 1 g tablet TAKE 1 TABLET (1 G TOTAL) BY MOUTH 4 (FOUR) TIMES DAILY - WITH MEALS AND AT BEDTIME. 360 tablet 1   No current facility-administered medications for this visit.     Past Surgical History:  Procedure Laterality Date   BIV ICD GENERATOR CHANGEOUT N/A 01/24/2021   Procedure: BIV ICD GENERATOR CHANGEOUT;  Surgeon: Deboraha Sprang, MD;  Location: [facility];  Service: Cardiovascular;  Laterality: N/A;   CARDIAC CATHETERIZATION N/A 02/26/2015   Procedure: Right/Left Heart Cath and Coronary Angiography;  Surgeon: Troy Sine, MD;  Location: [facility];  Service: Cardiovascular;   Laterality: N/A;   COLPOSCOPY  2013   neg   EP IMPLANTABLE DEVICE N/A 10/14/2015   Procedure: BiV ICD Insertion CRT-D;  Surgeon: Deboraha Sprang, MD;  Location: [facility];  Service: Cardiovascular;  Laterality: N/A;   ESOPHAGOGASTRODUODENOSCOPY (EGD) WITH ESOPHAGEAL DILATION  11/30/2008   esophageal ring dilated 81 French, H. pylori gastritis   TUBAL LIGATION  1991   WISDOM TOOTH EXTRACTION       Allergies  Allergen Reactions   Amoxicillin Other (See Comments)    REACTION: Tacycardia   Clarithromycin Hives    Breaks out and gets whelps   Meloxicam    Spiriva Respimat [Tiotropium Bromide Monohydrate] Rash      Family History  Problem Relation Age of Onset   Diabetes Mother    Hypertension Mother    Hypothyroidism Mother    Lung cancer Maternal Grandmother    Breast cancer Maternal Aunt    Brain cancer Maternal Uncle    Prostate cancer Paternal Uncle    Colon cancer Neg Hx    Colon polyps Neg Hx    Esophageal cancer Neg Hx      Social History Ms. Wuest reports that she has never smoked. She has never used smokeless tobacco. Ms. Yazdani reports current alcohol use.   Review of Systems CONSTITUTIONAL: No weight loss, fever, chills, weakness or fatigue.  HEENT: Eyes: No visual loss, blurred vision, double vision or yellow sclerae.No hearing loss, sneezing, congestion, runny nose or sore throat.  SKIN: No rash or itching.  CARDIOVASCULAR: per hpi RESPIRATORY: No shortness of breath, cough or sputum.  GASTROINTESTINAL: No anorexia, nausea, vomiting or diarrhea. No abdominal pain or blood.  GENITOURINARY: No burning on urination, no polyuria NEUROLOGICAL: No headache, dizziness, syncope, paralysis, ataxia, numbness or tingling in the extremities. No change in bowel or bladder control.  MUSCULOSKELETAL: No muscle, back pain, joint pain or stiffness.  LYMPHATICS: No enlarged nodes. No history of splenectomy.  PSYCHIATRIC: No history of depression or anxiety.   ENDOCRINOLOGIC: No reports of sweating, cold or heat intolerance. No polyuria or polydipsia.  Marland Kitchen   Physical Examination Today's Vitals   01/23/22 1306  BP: 106/80  Pulse: 76  SpO2: 96%  Weight: 167 lb 9.6 oz (76 kg)  Height: '5\' 1"'$  (1.549 m)   Body mass index is 31.67 kg/m.  Gen: resting comfortably, no acute distress HEENT: no scleral icterus, pupils equal round and reactive, no palptable cervical adenopathy,  CV: RRR, no m/r/g no jvd Resp: Clear to auscultation bilaterally GI: abdomen is soft, non-tender, non-distended, normal bowel sounds, no hepatosplenomegaly MSK: extremities are warm, no edema.  Skin: warm, no rash Neuro:  no focal  deficits Psych: appropriate affect   Diagnostic Studies  08/11/2021 sleep study  IMPRESSIONS - No significant obstructive sleep apnea occurred during this study (AHI = 2.7/h). - Mild oxygen desaturation was noted during this study (Min O2 = 87.00%). - The patient snored with moderate snoring volume. - No cardiac abnormalities were noted during this study. - Mild periodic limb movements of sleep occurred during the study. No significant associated arousals.  Assessment and Plan  1.Chronic systolic HF - normalization of LV function after BiV AICD impantation, LVEF remains normal - no symptoms, continue current meds  2. Chest pain - recent negative stress test, no recurrent symptoms. Continue to monitor  3. Isolated episode Afib - transient 1.5 hr episdoes on last device check, on review of other checks do not see noted - monitor at this time. First time im seeing her but from charted info her CHADS2Vasc would be 1 for gender given her HF as resolved. I see no documentation of HTN history or diabetes, no prior stroke or CAD. At this time does not appear anticoag would be indicated even if she had an increased burden.       Arnoldo Lenis, M.D.

## 2022-01-23 NOTE — Patient Instructions (Signed)
Medication Instructions:  Continue all current medications.   Labwork: none  Testing/Procedures: none  Follow-Up: 6 months   Any Other Special Instructions Will Be Listed Below (If Applicable).   If you need a refill on your cardiac medications before your next appointment, please call your pharmacy.  

## 2022-01-29 NOTE — Progress Notes (Signed)
Remote ICD transmission.   

## 2022-02-28 ENCOUNTER — Other Ambulatory Visit: Payer: Self-pay | Admitting: Cardiology

## 2022-02-28 DIAGNOSIS — I5022 Chronic systolic (congestive) heart failure: Secondary | ICD-10-CM

## 2022-03-10 ENCOUNTER — Other Ambulatory Visit: Payer: Self-pay | Admitting: Cardiology

## 2022-03-10 DIAGNOSIS — I5022 Chronic systolic (congestive) heart failure: Secondary | ICD-10-CM

## 2022-03-13 ENCOUNTER — Encounter: Payer: Self-pay | Admitting: Obstetrics and Gynecology

## 2022-03-25 ENCOUNTER — Other Ambulatory Visit: Payer: Self-pay

## 2022-03-25 ENCOUNTER — Emergency Department (HOSPITAL_COMMUNITY)
Admission: EM | Admit: 2022-03-25 | Discharge: 2022-03-25 | Disposition: A | Discharge status: Home / Self Care | Payer: BC Managed Care – PPO | Attending: Emergency Medicine | Admitting: Emergency Medicine

## 2022-03-25 ENCOUNTER — Telehealth: Payer: Self-pay | Admitting: Cardiology

## 2022-03-25 ENCOUNTER — Encounter (HOSPITAL_COMMUNITY): Payer: Self-pay | Admitting: Emergency Medicine

## 2022-03-25 DIAGNOSIS — J45909 Unspecified asthma, uncomplicated: Secondary | ICD-10-CM | POA: Insufficient documentation

## 2022-03-25 DIAGNOSIS — R7989 Other specified abnormal findings of blood chemistry: Secondary | ICD-10-CM | POA: Diagnosis not present

## 2022-03-25 DIAGNOSIS — R55 Syncope and collapse: Secondary | ICD-10-CM | POA: Diagnosis present

## 2022-03-25 DIAGNOSIS — I509 Heart failure, unspecified: Secondary | ICD-10-CM | POA: Diagnosis not present

## 2022-03-25 LAB — CBG MONITORING, ED: Glucose-Capillary: 91 mg/dL (ref 70–99)

## 2022-03-25 LAB — CBC
HCT: 36.5 % (ref 36.0–46.0)
Hemoglobin: 12.5 g/dL (ref 12.0–15.0)
MCH: 29.6 pg (ref 26.0–34.0)
MCHC: 34.2 g/dL (ref 30.0–36.0)
MCV: 86.5 fL (ref 80.0–100.0)
Platelets: 257 10*3/uL (ref 150–400)
RBC: 4.22 MIL/uL (ref 3.87–5.11)
RDW: 13.8 % (ref 11.5–15.5)
WBC: 5.4 10*3/uL (ref 4.0–10.5)
nRBC: 0 % (ref 0.0–0.2)

## 2022-03-25 LAB — URINALYSIS, ROUTINE W REFLEX MICROSCOPIC
Bilirubin Urine: NEGATIVE
Glucose, UA: NEGATIVE mg/dL
Hgb urine dipstick: NEGATIVE
Ketones, ur: NEGATIVE mg/dL
Nitrite: NEGATIVE
Protein, ur: 30 mg/dL — AB
Specific Gravity, Urine: 1.012 (ref 1.005–1.030)
pH: 6 (ref 5.0–8.0)

## 2022-03-25 LAB — BASIC METABOLIC PANEL
Anion gap: 6 (ref 5–15)
BUN: 11 mg/dL (ref 6–20)
CO2: 27 mmol/L (ref 22–32)
Calcium: 9.1 mg/dL (ref 8.9–10.3)
Chloride: 107 mmol/L (ref 98–111)
Creatinine, Ser: 1.07 mg/dL — ABNORMAL HIGH (ref 0.44–1.00)
GFR, Estimated: >60 mL/min (ref >60)
Glucose, Bld: 87 mg/dL (ref 70–99)
Potassium: 3.8 mmol/L (ref 3.5–5.1)
Sodium: 140 mmol/L (ref 135–145)

## 2022-03-25 MED ORDER — SODIUM CHLORIDE 0.9 % IV BOLUS
1000.0000 mL | Freq: Once | INTRAVENOUS | Status: AC
Start: 2022-03-25 — End: 2022-03-25
  Administered 2022-03-25: 1000 mL via INTRAVENOUS

## 2022-03-25 MED ORDER — ACETAMINOPHEN 500 MG PO TABS
1000.0000 mg | ORAL_TABLET | Freq: Four times a day (QID) | ORAL | Status: DC | PRN
Start: 2022-03-25 — End: 2022-03-26
  Administered 2022-03-25: 1000 mg via ORAL
  Filled 2022-03-25: qty 2

## 2022-03-25 NOTE — ED Notes (Signed)
ED Provider at bedside. 

## 2022-03-25 NOTE — ED Triage Notes (Signed)
Pt was leaving physicians office this am and had syncopal episode, lasting a few minutes, currently having headache, PMH defibrillator, capture episode sent to Cardiologist.

## 2022-03-25 NOTE — ED Notes (Signed)
Pt ambulated to the bathroom with standby assist 

## 2022-03-25 NOTE — Telephone Encounter (Signed)
Patient states that she was seeing her hand specialist this morning and while trying to get up out of the chair, she passed out. States that after she was helped out of the floor seh had her BP checked a few times and some of her reading was 112/89 P 90, 102/70 P 70, 115/76 P 69, 126/87 P 55 and 112/76 P62 which what her readings have been for the past week or so. States that she has had some dizziness and headaches since this morning but denies chest pain or sob. States that she does have appointment with her PCP this afternoon at 5:15 pm. Patient also instructed to send transmission to device clinic. Please advise

## 2022-03-25 NOTE — Telephone Encounter (Signed)
  Pt c/o Syncope: STAT if syncope occurred within 30 minutes and pt complains of lightheadedness High Priority if episode of passing out, completely, today or in last 24 hours   Did you pass out today? Yes this morning  When is the last time you passed out? 8:30 am   Has this occurred multiple times? No  Did you have any symptoms prior to passing out? Lightheaded and feeling hot.   Pt said, she was at a clinic to get her hand scan and her BP was low, she said, while waiting she felt very lightheaded and passed out. Her BP was check while she was there  112/89 90 102/70 70 115/76 69 126/87 55  112/76 62

## 2022-03-25 NOTE — Discharge Instructions (Addendum)
Return to the ED with any new or worsening signs or symptoms such as chest pain, shortness of breath Please follow-up with your PCP for further management Please continue to remain hydrated at home Please read the attached guide concerning syncope

## 2022-03-25 NOTE — Telephone Encounter (Signed)
Transmission received, normal device function, presenting rhythm As/Bi-Vp, normal lead measurements no episodes detected   Spoke with patient informed her that transmission was normal.

## 2022-03-25 NOTE — ED Provider Notes (Signed)
Mary Pratt Provider Note   CSN: [id number] Arrival date & time: 03/25/22  1813     History  Chief Complaint  Patient presents with   Loss of Consciousness    Mary Pratt is a 51 y.o. female with medical history of anxiety, asthma, cardiac defibrillator, CHF, GERD, nonischemic cardiomyopathy.  The patient presents to the ED for evaluation of syncope.  The patient states that this morning she was at her hand specialist office having nerve testing done.  The patient states that she had been laying down for about 30 minutes to have this test conducted.  The patient states that she then stood up and walked to the checkout desk.  The patient states that she was waiting to be checked out when she became lightheaded, dizzy, hot and then "blacked out" as she describes it.  The patient states that she lost consciousness for no more than 30 seconds and then came to.  Patient sent over transmission of her defibrillator to her cardiologist office who noted that she had no abnormal events noted.  The patient denies any chest pain, shortness of breath, nausea, vomiting, diarrhea, abdominal pain, fevers, dysuria.  The patient states that she has not been drinking as much water as usual recently.  The patient states that she currently is taking 20 mg of Lasix once daily and has not increased this dosage.   Loss of Consciousness Associated symptoms: no chest pain, no dizziness, no fever, no nausea, no shortness of breath, no vomiting and no weakness        Home Medications Prior to Admission medications   Medication Sig Start Date End Date Taking? Authorizing Provider  albuterol (PROVENTIL HFA;VENTOLIN HFA) 108 (90 BASE) MCG/ACT inhaler Inhale into the lungs every 6 (six) hours as needed for wheezing or shortness of breath.   Yes [provider]  azelastine (ASTELIN) 0.1 % nasal spray Place 2 sprays into both nostrils 2 (two) times daily. Use in each nostril as directed    Yes [provider]  dexlansoprazole (DEXILANT) 60 MG capsule Take 60 mg by mouth daily.   Yes [provider]  fexofenadine (ALLEGRA) 180 MG tablet Take 180 mg by mouth daily.   Yes [provider]  Fluticasone-Salmeterol (ADVAIR DISKUS) 250-50 MCG/DOSE AEPB Inhale 1 puff into the lungs 2 (two) times daily. 03/23/13  Yes [name], Armando Reichert, MD  furosemide (LASIX) 20 MG tablet TAKE 1 TABLET BY MOUTH EVERY DAY 03/02/22  Yes Branch, Alphonse Guild, MD  nitroGLYCERIN (NITROSTAT) 0.4 MG SL tablet Place 1 tablet (0.4 mg total) under the tongue every 5 (five) minutes x 3 doses as needed for chest pain. 06/25/21  Yes Bhagat, Bhavinkumar, PA  sacubitril-valsartan (ENTRESTO) 24-26 MG Take 1 tablet by mouth 2 (two) times daily. 01/05/22  Yes BranchAlphonse Guild, MD  sertraline (ZOLOFT) 100 MG tablet Take 100 mg by mouth in the morning and at bedtime. 01/24/20  Yes [provider]  spironolactone (ALDACTONE) 25 MG tablet Take 1 tablet (25 mg total) by mouth daily. 09/30/21  Yes Branch, Alphonse Guild, MD  carvedilol (COREG) 25 MG tablet TAKE 1 TABLET (25 MG TOTAL) BY MOUTH TWICE A DAY WITH MEALS Patient taking differently: Take 25 mg by mouth 2 (two) times daily with a meal. 03/11/22   Branch, Alphonse Guild, MD  lidocaine (LIDODERM) 5 % Place 1 patch onto the skin daily. Remove & Discard patch within 12 hours or as directed by MD Patient not taking: Reported on  03/25/2022 01/14/22   Sherrill Raring, PA-C      Allergies    Amoxicillin, Clarithromycin, Meloxicam, and Spiriva respimat [tiotropium bromide monohydrate]    Review of Systems   Review of Systems  Constitutional:  Negative for fever.  Respiratory:  Negative for shortness of breath.   Cardiovascular:  Positive for syncope. Negative for chest pain.  Gastrointestinal:  Negative for diarrhea, nausea and vomiting.  Genitourinary:  Negative for dysuria.  Neurological:  Positive for syncope and light-headedness. Negative for dizziness, weakness  and numbness.  All other systems reviewed and are negative.   Physical Exam Updated Vital Signs BP 138/90   Pulse (!) 57   Temp 98.1 F (36.7 C) (Oral)   Resp 19   Ht '5\' 1"'$  (1.549 m)   Wt 73.9 kg   SpO2 97%   BMI 30.80 kg/m  Physical Exam Vitals and nursing note reviewed.  Constitutional:      General: She is not in acute distress.    Appearance: Normal appearance. She is not ill-appearing, toxic-appearing or diaphoretic.  HENT:     Head: Normocephalic and atraumatic.     Nose: Nose normal. No congestion.     Mouth/Throat:     Mouth: Mucous membranes are moist.     Pharynx: Oropharynx is clear.  Eyes:     Extraocular Movements: Extraocular movements intact.     Conjunctiva/sclera: Conjunctivae normal.     Pupils: Pupils are equal, round, and reactive to light.  Cardiovascular:     Rate and Rhythm: Normal rate and regular rhythm.  Pulmonary:     Effort: Pulmonary effort is normal.     Breath sounds: Normal breath sounds. No wheezing.  Abdominal:     General: Abdomen is flat. Bowel sounds are normal.     Palpations: Abdomen is soft.     Tenderness: There is no abdominal tenderness.  Musculoskeletal:     Cervical back: Normal range of motion and neck supple. No tenderness.  Skin:    General: Skin is warm and dry.     Capillary Refill: Capillary refill takes less than 2 seconds.  Neurological:     Mental Status: She is alert and oriented to person, place, and time.     GCS: GCS eye subscore is 4. GCS verbal subscore is 5. GCS motor subscore is 6.     Cranial Nerves: Cranial nerves 2-12 are intact. No cranial nerve deficit.     Sensory: Sensation is intact. No sensory deficit.     Motor: Motor function is intact. No weakness.     Coordination: Coordination is intact. Heel to Shin Test normal.     ED Results / Procedures / Treatments   Labs (all labs ordered are listed, but only abnormal results are displayed) Labs Reviewed  BASIC METABOLIC PANEL - Abnormal;  Notable for the following components:      Result Value   Creatinine, Ser 1.07 (*)    All other components within normal limits  URINALYSIS, ROUTINE W REFLEX MICROSCOPIC - Abnormal; Notable for the following components:   APPearance CLOUDY (*)    Protein, ur 30 (*)    Leukocytes,Ua LARGE (*)    Bacteria, UA FEW (*)    All other components within normal limits  CBC  CBG MONITORING, ED    EKG None  Radiology No results found.  Procedures Procedures   Medications Ordered in ED Medications  acetaminophen (TYLENOL) tablet 1,000 mg (1,000 mg Oral Given 03/25/22 1941)  sodium chloride 0.9 %  bolus 1,000 mL (0 mLs Intravenous Stopped 03/25/22 2133)    ED Course/ Medical Decision Making/ A&P                           Medical Decision Making Amount and/or Complexity of Data Reviewed Labs: ordered.   51 year old female presents to the ED for evaluation of syncope.  Please see HPI for further details.  On examination, the patient is afebrile and nontachycardic.  The patient lung sounds are clear bilaterally, she is not hypoxic on room air.  The patient's abdomen is soft and compressible in all 4 quadrants.  The patient neurological examination shows no focal neurodeficits.  The patient is nontoxic in appearance.  The patient was worked up utilizing the following labs and imaging studies interpreted by me personally: - Urinalysis with protein, large leukocytes in the urine - BMP with elevated creatinine of 1.07 treated with 1 L normal saline - CBC unremarkable - CBG 91 - EKG sinus rhythm, nonischemic  Patient was treated with 1 L normal saline, 1000 mg Tylenol for headache.  The patient states she feels better after these were given.  Due to the patient elevated creatinine, some suspicion is given to this patient possibly being hypovolemic causing syncope and collapse.  The patient reports that she had been lying down for 30 minutes prior to this occurring and then stood up  suddenly.  Patient has defibrillator in place.  Incident report was faxed over, the device is noted to be functioning normally with no transmissions noted.  The transmission was normal according to the chart review.  The patient will be discharged home and advised to follow-up with her PCP.  The patient was given return precautions and she voiced understanding with these.  The patient had all of her questions answered to her satisfaction.  The patient is stable at this time for discharge home.  Final Clinical Impression(s) / ED Diagnoses Final diagnoses:  Syncope and collapse    Rx / DC Orders ED Discharge Orders     None         Lawana Chambers 03/25/22 2239    Varney Biles, MD 03/26/22 1512

## 2022-03-26 NOTE — Telephone Encounter (Signed)
Normal device check, what did her pcp think?  Zandra Abts MD

## 2022-03-26 NOTE — Telephone Encounter (Signed)
Patient was seen at [facility] and instructed to follow up with PCP asap.

## 2022-04-24 ENCOUNTER — Ambulatory Visit (INDEPENDENT_AMBULATORY_CARE_PROVIDER_SITE_OTHER): Payer: BC Managed Care – PPO

## 2022-04-24 DIAGNOSIS — I428 Other cardiomyopathies: Secondary | ICD-10-CM

## 2022-04-24 LAB — CUP PACEART REMOTE DEVICE CHECK
Battery Remaining Longevity: 45 mo
Battery Voltage: 2.98 V
Brady Statistic AP VP Percent: 0.23 %
Brady Statistic AP VS Percent: 0.02 %
Brady Statistic AS VP Percent: 99.72 %
Brady Statistic AS VS Percent: 0.04 %
Brady Statistic RA Percent Paced: 0.24 %
Brady Statistic RV Percent Paced: 99.72 %
Date Time Interrogation Session: 20230818043623
HighPow Impedance: 73 Ohm
Implantable Lead Implant Date: 20170206
Implantable Lead Implant Date: 20170206
Implantable Lead Implant Date: 20170206
Implantable Lead Location: 753858
Implantable Lead Location: 753859
Implantable Lead Location: 753860
Implantable Lead Model: 4398
Implantable Lead Model: 5076
Implantable Pulse Generator Implant Date: 20220520
Lead Channel Impedance Value: 1064 Ohm
Lead Channel Impedance Value: 1140 Ohm
Lead Channel Impedance Value: 1178 Ohm
Lead Channel Impedance Value: 1254 Ohm
Lead Channel Impedance Value: 1349 Ohm
Lead Channel Impedance Value: 289.597
Lead Channel Impedance Value: 299.908
Lead Channel Impedance Value: 306.269
Lead Channel Impedance Value: 354.667
Lead Channel Impedance Value: 370.256
Lead Channel Impedance Value: 418 Ohm
Lead Channel Impedance Value: 513 Ohm
Lead Channel Impedance Value: 532 Ohm
Lead Channel Impedance Value: 551 Ohm
Lead Channel Impedance Value: 665 Ohm
Lead Channel Impedance Value: 722 Ohm
Lead Channel Impedance Value: 760 Ohm
Lead Channel Impedance Value: 950 Ohm
Lead Channel Pacing Threshold Amplitude: 0.5 V
Lead Channel Pacing Threshold Amplitude: 0.5 V
Lead Channel Pacing Threshold Amplitude: 3 V
Lead Channel Pacing Threshold Pulse Width: 0.4 ms
Lead Channel Pacing Threshold Pulse Width: 0.4 ms
Lead Channel Pacing Threshold Pulse Width: 1 ms
Lead Channel Sensing Intrinsic Amplitude: 0.25 mV
Lead Channel Sensing Intrinsic Amplitude: 0.25 mV
Lead Channel Sensing Intrinsic Amplitude: 9.875 mV
Lead Channel Sensing Intrinsic Amplitude: 9.875 mV
Lead Channel Setting Pacing Amplitude: 1.5 V
Lead Channel Setting Pacing Amplitude: 2 V
Lead Channel Setting Pacing Amplitude: 3.75 V
Lead Channel Setting Pacing Pulse Width: 0.4 ms
Lead Channel Setting Pacing Pulse Width: 1 ms
Lead Channel Setting Sensing Sensitivity: 0.3 mV

## 2022-05-01 ENCOUNTER — Telehealth: Payer: Self-pay | Admitting: *Deleted

## 2022-05-01 NOTE — Telephone Encounter (Signed)
   Pre-operative Risk Assessment    Patient Name: Mary Pratt  DOB: 11/23/1970 MRN: [id number]      Request for Surgical Clearance    Procedure:   LEFT WRIST A-SCOPE WITH DEBRIDEMENT AS NEEDED, LT CARPAL TUNNEL RELEASE  Date of Surgery:  Clearance 05/15/22                                 Surgeon:  DR. Charlotte Crumb Surgeon's Group or Practice Name:  [place] Phone number:  [phone] Fax number:  [phone] ATTN RHONDA   Type of Clearance Requested:   - Medical ; NO MEDICATIONS ARE LISTED AS NEEDING TO BE HELD   Type of Anesthesia:  MAC/AXILLARY BLOCK   Additional requests/questions:    [name]   05/01/2022, 9:01 AM

## 2022-05-01 NOTE — Telephone Encounter (Signed)
The pt called back, though I was not available right then. I asked the operator to please let the pt know I will call her back in about 10 minutes.   I tried to reach the pt less than 5 minutes later and got her vm.

## 2022-05-01 NOTE — Telephone Encounter (Signed)
Left message to call back for tele pre op appt 

## 2022-05-01 NOTE — Telephone Encounter (Signed)
Pt agreeable to plan of care for televisit pre op 05/06/22 @ 3 pm. Med rec and consent are done.,

## 2022-05-01 NOTE — Telephone Encounter (Signed)
Pt agreeable to plan of care for televisit pre op 05/06/22 @ 3 pm. Med rec and consent are done.,    Patient Consent for Virtual Visit        Mary Pratt has provided verbal consent on 05/01/2022 for a virtual visit (video or telephone).   CONSENT FOR VIRTUAL VISIT FOR:  Mary Pratt  By participating in this virtual visit I agree to the following:  I hereby voluntarily request, consent and authorize [facility] and its employed or contracted physicians, physician assistants, nurse practitioners or other licensed health care professionals (the Practitioner), to provide me with telemedicine health care services (the "Services") as deemed necessary by the treating Practitioner. I acknowledge and consent to receive the Services by the Practitioner via telemedicine. I understand that the telemedicine visit will involve communicating with the Practitioner through live audiovisual communication technology and the disclosure of certain medical information by electronic transmission. I acknowledge that I have been given the opportunity to request an in-person assessment or other available alternative prior to the telemedicine visit and am voluntarily participating in the telemedicine visit.  I understand that I have the right to withhold or withdraw my consent to the use of telemedicine in the course of my care at any time, without affecting my right to future care or treatment, and that the Practitioner or I may terminate the telemedicine visit at any time. I understand that I have the right to inspect all information obtained and/or recorded in the course of the telemedicine visit and may receive copies of available information for a reasonable fee.  I understand that some of the potential risks of receiving the Services via telemedicine include:  Delay or interruption in medical evaluation due to technological equipment failure or disruption; Information transmitted may not be sufficient (e.g.  poor resolution of images) to allow for appropriate medical decision making by the Practitioner; and/or  In rare instances, security protocols could fail, causing a breach of personal health information.  Furthermore, I acknowledge that it is my responsibility to provide information about my medical history, conditions and care that is complete and accurate to the best of my ability. I acknowledge that Practitioner's advice, recommendations, and/or decision may be based on factors not within their control, such as incomplete or inaccurate data provided by me or distortions of diagnostic images or specimens that may result from electronic transmissions. I understand that the practice of medicine is not an exact science and that Practitioner makes no warranties or guarantees regarding treatment outcomes. I acknowledge that a copy of this consent can be made available to me via my patient portal ([facility]), or I can request a printed copy by calling the office of [facility].    I understand that my insurance will be billed for this visit.   I have read or had this consent read to me. I understand the contents of this consent, which adequately explains the benefits and risks of the Services being provided via telemedicine.  I have been provided ample opportunity to ask questions regarding this consent and the Services and have had my questions answered to my satisfaction. I give my informed consent for the services to be provided through the use of telemedicine in my medical care

## 2022-05-01 NOTE — Telephone Encounter (Signed)
   Name: Mary Pratt  DOB: [birth year]  MRN: [id number]  Primary Cardiologist: Carlyle Dolly, MD  Chart reviewed as part of pre-operative protocol coverage. Because of Mary Pratt's past medical history and time since last visit, she will require a follow-up telephone visit in order to better assess preoperative cardiovascular risk.  Pre-op covering staff: - Please schedule appointment and call patient to inform them. If patient already had an upcoming appointment within acceptable timeframe, please add "pre-op clearance" to the appointment notes so provider is aware. - Please contact requesting surgeon's office via preferred method (i.e, phone, fax) to inform them of need for appointment prior to surgery.  No medications are indicated as needing held.  Elgie Collard, PA-C  05/01/2022, 11:26 AM

## 2022-05-06 ENCOUNTER — Ambulatory Visit: Payer: BC Managed Care – PPO | Attending: Cardiology | Admitting: Physician Assistant

## 2022-05-06 DIAGNOSIS — Z01810 Encounter for preprocedural cardiovascular examination: Secondary | ICD-10-CM

## 2022-05-06 NOTE — Progress Notes (Signed)
Virtual Visit via Telephone Note   Because of Mary Pratt's co-morbid illnesses, she is at least at moderate risk for complications without adequate follow up.  This format is felt to be most appropriate for this patient at this time.  The patient did not have access to video technology/had technical difficulties with video requiring transitioning to audio format only (telephone).  All issues noted in this document were discussed and addressed.  No physical exam could be performed with this format.  Please refer to the patient's chart for her consent to telehealth for Mary Pratt.  Evaluation Performed:  Preoperative cardiovascular risk assessment _____________   Date:  05/06/2022   Patient ID:  Mary Pratt, DOB 1971/04/11, MRN [id number] Patient Location:  Home Provider location:   Office  Primary Care Provider:  Caryl Bis, MD Primary Cardiologist:  Carlyle Dolly, MD  Chief Complaint / Patient Profile   51 y.o. y/o adult with a h/o   (HFrEF) heart failure with reduced ejection fraction  Non-ischemic cardiomyopathy  Cath 2016: no CAD EF returned to normal after CRT-D Atrial fibrillation  Low burden on device check - no anticoag yet  SS Trait GERD  who is pending Procedure:   LEFT WRIST A-SCOPE WITH DEBRIDEMENT AS NEEDED, LT CARPAL TUNNEL RELEASE Date of Surgery:  Clearance 05/15/22                              Surgeon:  DR. MATTHEW WEINGOLD Surgeon's Group or Practice Name:  [place] Phone number:  [phone] Fax number:  [phone] ATTN RHONDA Type of Clearance Requested:   - Medical ; NO MEDICATIONS ARE LISTED AS NEEDING TO BE HELD Type of Anesthesia:  MAC/AXILLARY BLOCK  She presents today for telephonic preoperative cardiovascular risk assessment.  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 02/2015 Echo: EF 20%, sev dil w/ diff HK, worse @ inf base. Tiv AI, mild MR, mod dil LA, PASP 42mHg.   Functional dyspepsia    early satiety    GERD with stricture    dilated 29326  HELICOBACTER PYLORI GASTRITIS 01/15/2009   dyspnea, ? reaction to Biaxin/amoxicillin  Pylera 01/15/09 - completed therapy      NICM (nonischemic cardiomyopathy) ([facility]    a. 02/2015 Cath: nl cors, EF 15%, mild PAH;  b. 02/2015 Enrolled in VEST trial.   Sickle cell trait ([facility]    Situational depression    "son died"   Past Surgical History:  Procedure Laterality Date   BIV ICD GENERATOR CHANGEOUT N/A 01/24/2021   Procedure: BIV ICD GENERATOR CHANGEOUT;  Surgeon: KDeboraha Sprang MD;  [facility];  Service: Cardiovascular;  Laterality: N/A;   CARDIAC CATHETERIZATION N/A 02/26/2015   Procedure: Right/Left Heart Cath and Coronary Angiography;  Surgeon: TTroy Sine MD;  [facility];  Service: Cardiovascular;  Laterality: N/A;   COLPOSCOPY  2013   neg   EP IMPLANTABLE DEVICE N/A 10/14/2015   Procedure: BiV ICD Insertion CRT-D;  Surgeon: SDeboraha Sprang MD;  [facility];  Service: Cardiovascular;  Laterality: N/A;   ESOPHAGOGASTRODUODENOSCOPY (EGD) WITH ESOPHAGEAL DILATION  11/30/2008   esophageal ring dilated 54 French, H. pylori gastritis   TUBAL LIGATION  1991   WISDOM TOOTH EXTRACTION      Allergies  Allergies  Allergen Reactions   Amoxicillin Other (See Comments)    REACTION: Tacycardia   Clarithromycin Hives    Breaks out and gets whelps   Meloxicam    Spiriva Respimat [Tiotropium Bromide Monohydrate] Rash    History of Present Illness    Mary Pratt is a 51 y.o. adult who presents via audio/video conferencing for a telehealth visit today.  Pt was last seen in cardiology clinic on 5.19.23 by Dr. Harl Bowie.  At that time [name] was doing well.  The patient is now pending procedure  as outlined above. Since her last visit, she has done well w/o chest pain, shortness of breath, leg edema. She had vasovagal syncope in July due to dehydration. She has not had recurrent syncope.    Home Medications    Prior to Admission medications   Medication Sig Start Date End Date Taking? Authorizing Provider  albuterol (PROVENTIL HFA;VENTOLIN HFA) 108 (90 BASE) MCG/ACT inhaler Inhale into the lungs every 6 (six) hours as needed for wheezing or shortness of breath.    [provider]  azelastine (ASTELIN) 0.1 % nasal spray Place 2 sprays into both nostrils 2 (two) times daily. Use in each nostril as directed    [provider]  carvedilol (COREG) 25 MG tablet TAKE 1 TABLET (25 MG TOTAL) BY MOUTH TWICE A DAY WITH MEALS Patient taking differently: Take 25 mg by mouth 2 (two) times daily with a meal. 03/11/22   Branch, Alphonse Guild, MD  dexlansoprazole (DEXILANT) 60 MG capsule Take 60 mg by mouth daily.    [provider]  fexofenadine (ALLEGRA) 180 MG tablet Take 180 mg by mouth daily.    [provider]  Fluticasone-Salmeterol (ADVAIR DISKUS) 250-50 MCG/DOSE AEPB Inhale 1 puff into the lungs 2 (two) times daily. 03/23/13   [name], Armando Reichert, MD  furosemide (LASIX) 20 MG tablet TAKE 1 TABLET BY MOUTH EVERY DAY 03/02/22   Arnoldo Lenis, MD  lidocaine (LIDODERM) 5 % Place 1 patch onto the skin daily. Remove & Discard patch within 12 hours or as directed by MD Patient not taking: Reported on 03/25/2022 01/14/22   Sherrill Raring, PA-C  nitroGLYCERIN (NITROSTAT) 0.4 MG SL tablet Place 1 tablet (0.4 mg total) under the tongue every 5 (five) minutes x 3 doses as needed for chest pain. 06/25/21   Bhagat, Bhavinkumar, PA  sacubitril-valsartan (ENTRESTO) 24-26 MG Take 1 tablet by mouth 2 (two) times daily. 01/05/22   Arnoldo Lenis, MD  sertraline (ZOLOFT) 100 MG tablet Take 100 mg by mouth in the morning and at bedtime. 01/24/20   [provider]  spironolactone  (ALDACTONE) 25 MG tablet Take 1 tablet (25 mg total) by mouth daily. 09/30/21   Arnoldo Lenis, MD    Physical Exam    Vital Signs:  [name] does not have vital signs available for review today.   Given telephonic nature of communication, physical exam is limited. AAOx3. NAD. Normal affect.  Speech and respirations are unlabored.  Accessory Clinical Findings    None  Assessment & Plan    1.  Preoperative Cardiovascular Risk Assessment:   Ms. Markuson's perioperative risk of a major cardiac event is 0.9% according to the Revised Cardiac Risk Index (RCRI).  Therefore, she is at low risk for perioperative complications.  Her functional capacity is good at 4.31 METs according to the Duke Activity Status Index (DASI). Recommendations: According to ACC/AHA guidelines, no further cardiovascular testing needed.  The patient may proceed to surgery at acceptable risk.      A copy of this note will be routed to requesting surgeon.  Time:   Today, I have spent 5 minutes with the patient with telehealth technology discussing medical history, symptoms, and management plan.     Richardson Dopp, PA-C  05/06/2022, 3:18 PM

## 2022-05-20 NOTE — Progress Notes (Signed)
Remote ICD transmission.   

## 2022-05-22 IMAGING — MG MM DIGITAL SCREENING BILAT W/ TOMO AND CAD
8 series · 8 of 24 positions shown · non-contrast
Comparison: Previous exam(s).

CLINICAL DATA: Screening.

EXAM:
DIGITAL SCREENING BILATERAL MAMMOGRAM WITH TOMOSYNTHESIS AND CAD
TECHNIQUE: Bilateral screening digital craniocaudal and mediolateral oblique
mammograms were obtained. Bilateral screening digital breast
tomosynthesis was performed. The images were evaluated with
computer-aided detection.

[R MLO synth-2D]
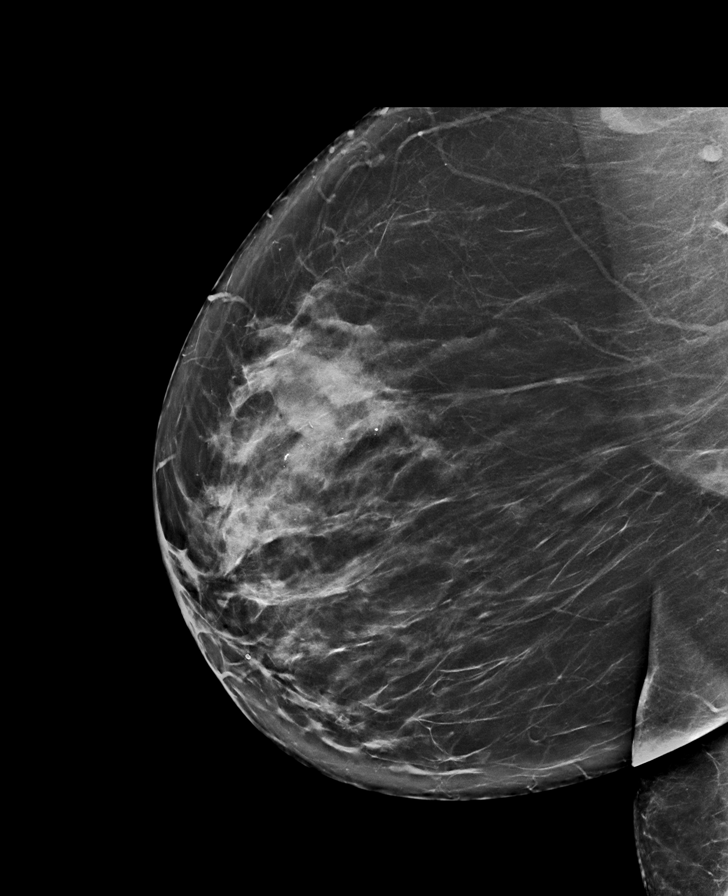

[R CC synth-2D]
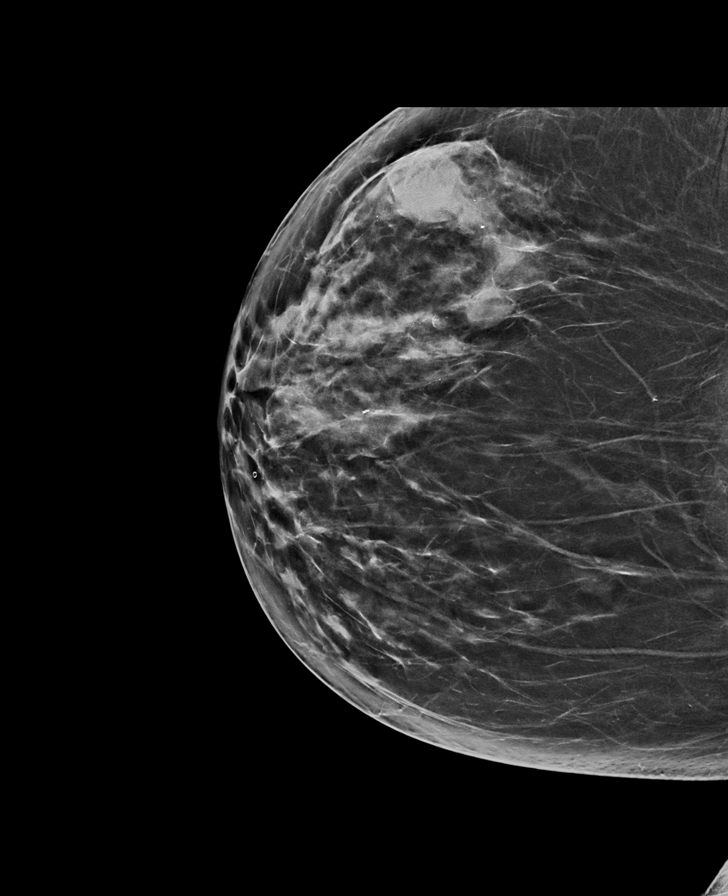

[L MLO synth-2D]
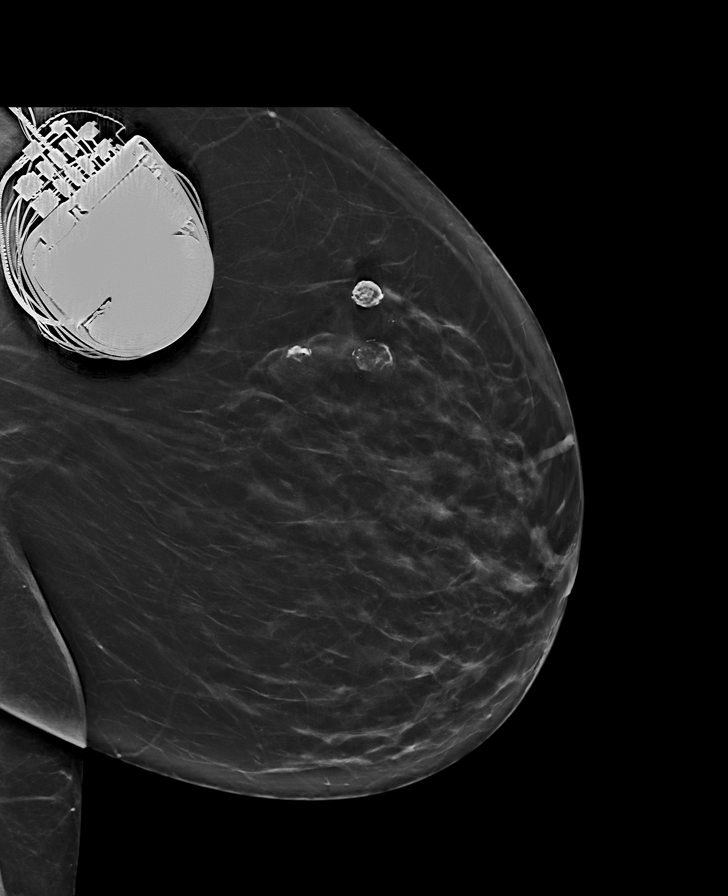

[L CC synth-2D]
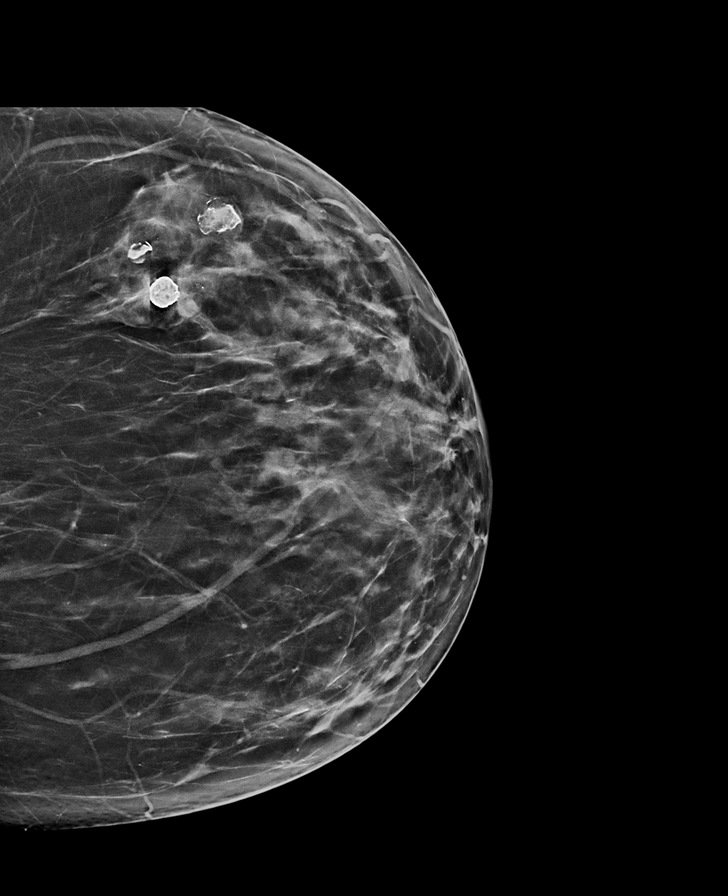

[L CC tomo · tomo slice 37/73.0]
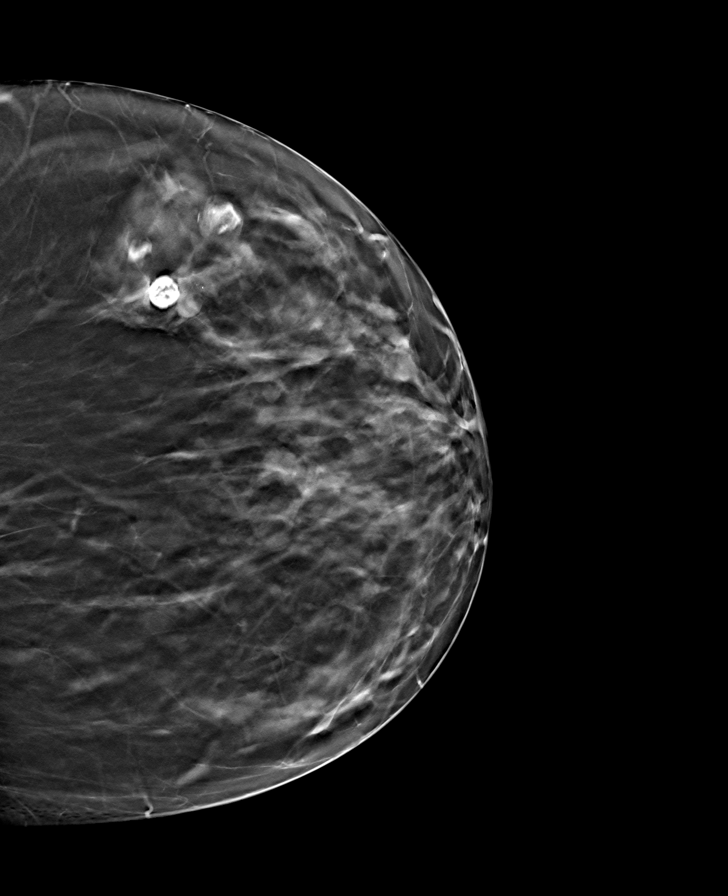

[R CC tomo · tomo slice 37/74.0]
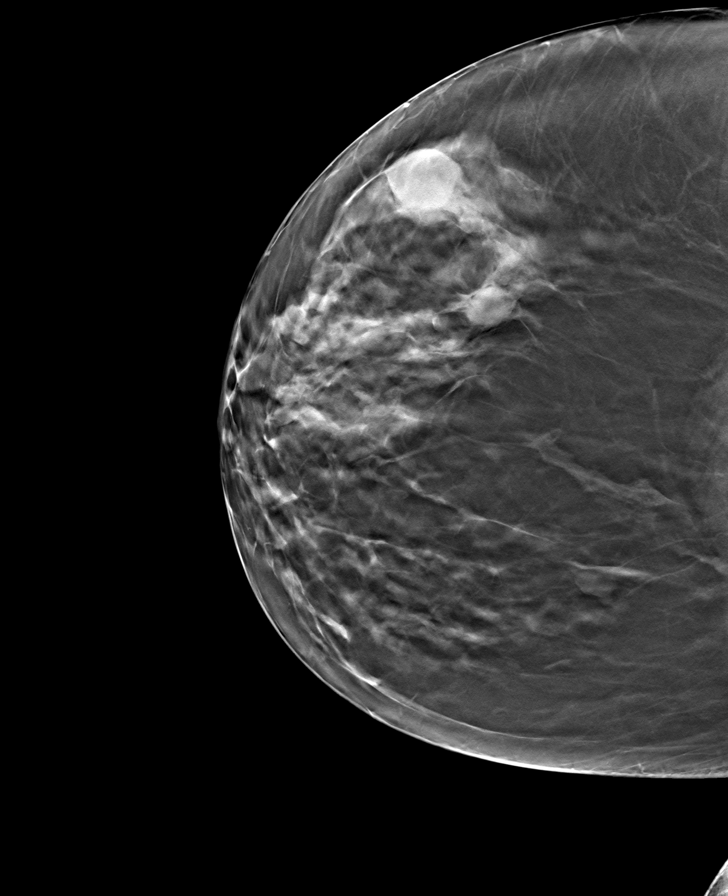

[L MLO tomo · tomo slice 43/84.0]
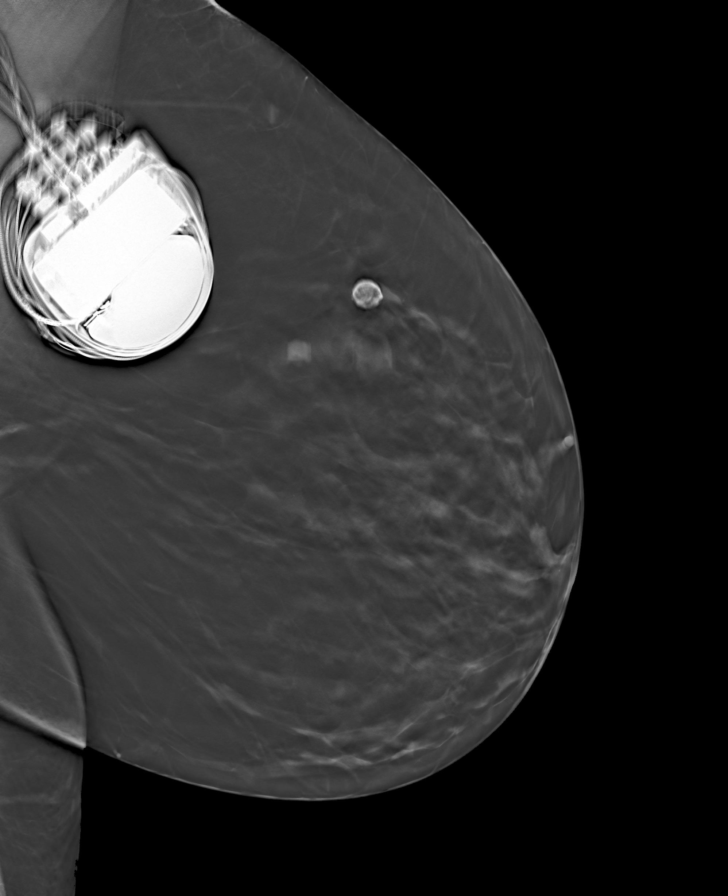

[R MLO tomo · tomo slice 43/86.0]
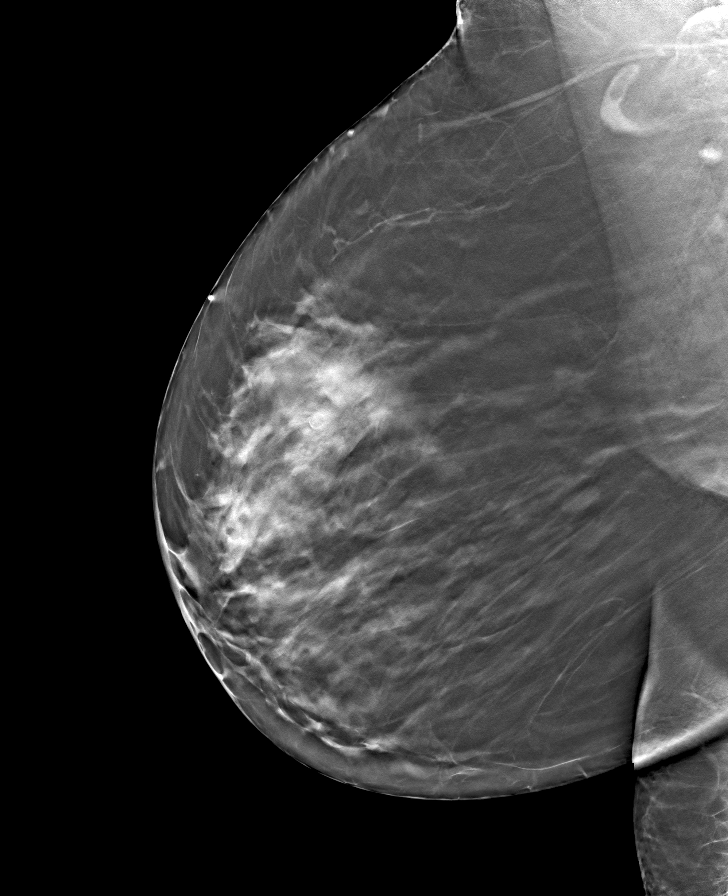

[8 of 24 positions shown; findings below may reference images not displayed]

ACR Breast Density Category b: There are scattered areas of
fibroglandular density.
FINDINGS: There are no findings suspicious for malignancy.
IMPRESSION: No mammographic evidence of malignancy. A result letter of this
screening mammogram will be mailed directly to the patient.

RECOMMENDATION:
Screening mammogram in one year. (Code:51-O-LD2)

BI-RADS CATEGORY  1: Negative.

## 2022-06-20 ENCOUNTER — Other Ambulatory Visit: Payer: Self-pay | Admitting: Cardiology

## 2022-06-20 DIAGNOSIS — I5022 Chronic systolic (congestive) heart failure: Secondary | ICD-10-CM

## 2022-07-10 ENCOUNTER — Encounter: Payer: Self-pay | Admitting: Neurology

## 2022-07-24 ENCOUNTER — Ambulatory Visit (INDEPENDENT_AMBULATORY_CARE_PROVIDER_SITE_OTHER): Payer: BC Managed Care – PPO

## 2022-07-24 DIAGNOSIS — I428 Other cardiomyopathies: Secondary | ICD-10-CM

## 2022-07-24 DIAGNOSIS — Z95810 Presence of automatic (implantable) cardiac defibrillator: Secondary | ICD-10-CM | POA: Diagnosis not present

## 2022-07-24 LAB — CUP PACEART REMOTE DEVICE CHECK
Battery Remaining Longevity: 45 mo
Battery Voltage: 2.98 V
Brady Statistic AP VP Percent: 0.11 %
Brady Statistic AP VS Percent: 0.01 %
Brady Statistic AS VP Percent: 99.84 %
Brady Statistic AS VS Percent: 0.04 %
Brady Statistic RA Percent Paced: 0.12 %
Brady Statistic RV Percent Paced: 99.67 %
Date Time Interrogation Session: 20231117012502
HighPow Impedance: 66 Ohm
Implantable Lead Connection Status: 753985
Implantable Lead Connection Status: 753985
Implantable Lead Connection Status: 753985
Implantable Lead Implant Date: 20170206
Implantable Lead Implant Date: 20170206
Implantable Lead Implant Date: 20170206
Implantable Lead Location: 753858
Implantable Lead Location: 753859
Implantable Lead Location: 753860
Implantable Lead Model: 4398
Implantable Lead Model: 5076
Implantable Pulse Generator Implant Date: 20220520
Lead Channel Impedance Value: 1026 Ohm
Lead Channel Impedance Value: 1235 Ohm
Lead Channel Impedance Value: 1235 Ohm
Lead Channel Impedance Value: 1311 Ohm
Lead Channel Impedance Value: 1482 Ohm
Lead Channel Impedance Value: 1520 Ohm
Lead Channel Impedance Value: 316.116
Lead Channel Impedance Value: 316.116
Lead Channel Impedance Value: 330.703
Lead Channel Impedance Value: 411.885
Lead Channel Impedance Value: 411.885
Lead Channel Impedance Value: 456 Ohm
Lead Channel Impedance Value: 532 Ohm
Lead Channel Impedance Value: 589 Ohm
Lead Channel Impedance Value: 646 Ohm
Lead Channel Impedance Value: 779 Ohm
Lead Channel Impedance Value: 779 Ohm
Lead Channel Impedance Value: 874 Ohm
Lead Channel Pacing Threshold Amplitude: 0.5 V
Lead Channel Pacing Threshold Amplitude: 0.5 V
Lead Channel Pacing Threshold Amplitude: 3.25 V
Lead Channel Pacing Threshold Pulse Width: 0.4 ms
Lead Channel Pacing Threshold Pulse Width: 0.4 ms
Lead Channel Pacing Threshold Pulse Width: 1 ms
Lead Channel Sensing Intrinsic Amplitude: 10 mV
Lead Channel Sensing Intrinsic Amplitude: 10 mV
Lead Channel Sensing Intrinsic Amplitude: 2 mV
Lead Channel Sensing Intrinsic Amplitude: 2 mV
Lead Channel Setting Pacing Amplitude: 1.5 V
Lead Channel Setting Pacing Amplitude: 2 V
Lead Channel Setting Pacing Amplitude: 3.75 V
Lead Channel Setting Pacing Pulse Width: 0.4 ms
Lead Channel Setting Pacing Pulse Width: 1 ms
Lead Channel Setting Sensing Sensitivity: 0.3 mV
Zone Setting Status: 755011

## 2022-08-04 NOTE — Progress Notes (Unsigned)
NEUROLOGY CONSULTATION NOTE  Mary Pratt MRN: [id number] DOB: 1970/12/07  Referring provider: Gar Ponto, MD Primary care provider: Gar Ponto, MD  Reason for consult:  headache  Assessment/Plan:   Chronic migraine without aura, without status migrainosus, not intractable Rebound headache - likely related to overuse of antihistamines/decongestants  Would still rule out secondary intracranial etiology - CT head Migraine prevention:  Start topiramate '25mg'$  at bedtime.  We can increase to '50mg'$  at bedtime in 4 weeks if needed Discontinue antihistamines/decongestants (at lease limited to no more than 2 days out of week Migraine rescue:  Tylenol and/or baclofen '10mg'$  (limited to no more than 2 days out of week) Caffeine cessation Exercise Hydration, proper diet, not to skip meals, proper sleep hygiene Keep headache diary Follow up 4-5 months.    Subjective:  Mary Pratt is a 51 year old female with CHF, asthma, cardiac defibrillator, depression and anxiety who presents for headaches.  History supplemented by ENT and PCP notes.  She has longstanding history of recurrent sinusitis and allergic rhinitis.  She has undergone sinus surgery in the past.  For years, she has been experiencing congestion in the nose, head and ears.  She takes Allegra daily and sometimes Flonase or Astelin spray.  Since her 18s, she has had daily headaches which she has always attributed to her sinuses.  They are mild to moderate mid-frontal throbbing pain and head pressure, sometimes with nausea, photophobia and phonophobia.  Has ear aches.  Worse in the morning.  They last 2-3 hours.  She will treat with her allergy medications which help, but they will return later in the day.  She sometimes treats headaches with Tylenol, but only once a week.  Sometimes her right shoulder feels stiff.  Reports some blurred vision.  Evaluated by the eye doctor.  Exam was fine.  She also reports excessive daytime  somnolence and will sleep during the day.  Sleep at night is variable.  She has previously been worked up for sleep apnea, which was negative.  She saw ENT this month.  CT sinuses on 07/28/2022 showed trace mucosal thickening but patent sinuses and only mild right deviated septum.    She does have history of migraines as a child, which would cause her to lay in a dark and quiet room.     Past NSAIDS/analgesics:  Fioricet Past abortive triptans:  none Past abortive ergotamine:  none Past muscle relaxants:  none Past anti-emetic:  none Past antihypertensive medications:  furosemide Past antidepressant medications:  duloxetine Past anticonvulsant medications:  none Past anti-CGRP:  none   Current NSAIDS/analgesics:  Tylenol Current triptans:  none Current ergotamine:  none Current anti-emetic:  none Current muscle relaxants:  none Current Antihypertensive medications:  carvedilol Current Antidepressant medications:  sertraline '100mg'$  BID Current Anticonvulsant medications:  none Current anti-CGRP:  none Current Antihistamines/Decongestants:  Allegra, Astelin, Flonase Other therapy:  none Birth control:  none    Caffeine:  1 cup coffee daily.  Sometimes tea.  No soda Depression:  yes; Anxiety:  yes Sleep hygiene:  varies.  Sleeps during day.  Fatigued.  She was checked for sleep apnea which was negative.   Family history of headache:  no      PAST MEDICAL HISTORY: Past Medical History:  Diagnosis Date   Abnormal pap 9/13   ASCUS + HPV, 11/14 LGSIL   Anxiety    Asthma    Cardiac defibrillator in place    Chronic systolic CHF (congestive heart failure) ([place])  a. 02/2015 Echo: EF 20%, sev dil w/ diff HK, worse [facility]. Tiv AI, mild MR, mod dil LA, PASP 61mHg.   Functional dyspepsia    early satiety    GERD with stricture    dilated 24627  HELICOBACTER PYLORI GASTRITIS 01/15/2009   dyspnea, ? reaction to Biaxin/amoxicillin  Pylera 01/15/09 - completed therapy       N[place] ([place]mic cardiomyopathy) [place]    a. 02/2015 Cath: nl cors, EF 15%, mild PAH;  b. 02/2015 Enrolled in VEST trial.   [place]    Situational depression    "son died"    PAST SURGICAL HISTORY: Past Surgical History:  Procedure Laterality Date   BIV ICD GENERATOR CHANGEOUT N/A 01/24/2021   Procedure: BIV ICD GENERATOR CHANGEOUT;  Surgeon: KDeboraha Sprang MD;  [facility];  Service: Cardiovascular;  Laterality: N/A;   CARDIAC CATHETERIZATION N/A 02/26/2015   Procedure: Right/Left Heart Cath and Coronary Angiography;  Surgeon: TTroy Sine MD;  [facility];  Service: Cardiovascular;  Laterality: N/A;   COLPOSCOPY  2013   neg   EP IMPLANTABLE DEVICE N/A 10/14/2015   Procedure: BiV ICD Insertion CRT-D;  Surgeon: SDeboraha Sprang MD;  [facility];  Service: Cardiovascular;  Laterality: N/A;   ESOPHAGOGASTRODUODENOSCOPY (EGD) WITH ESOPHAGEAL DILATION  11/30/2008   esophageal ring dilated 514French, H. pylori gastritis   TUBAL LIGATION  1991   WISDOM TOOTH EXTRACTION      MEDICATIONS: Current Outpatient Medications on File Prior to Visit  Medication Sig Dispense Refill   albuterol (PROVENTIL HFA;VENTOLIN HFA) 108 (90 BASE) MCG/ACT inhaler Inhale into the lungs every 6 (six) hours as needed for wheezing or shortness of breath.     azelastine (ASTELIN) 0.1 % nasal spray Place 2 sprays into both nostrils 2 (two) times daily. Use in each nostril as directed     carvedilol (COREG) 25 MG tablet TAKE 1 TABLET (25 MG TOTAL) BY MOUTH TWICE A DAY WITH MEALS (Patient taking differently: Take 25 mg by mouth 2 (two) times daily with a meal.) 180 tablet 1   dexlansoprazole (DEXILANT) 60 MG capsule Take 60 mg by mouth daily.     fexofenadine (ALLEGRA) 180 MG tablet Take 180 mg by mouth daily.     Fluticasone-Salmeterol (ADVAIR DISKUS) 250-50 MCG/DOSE AEPB Inhale 1 puff into the lungs 2 (two) times daily. 180 each 4   furosemide (LASIX) 20  MG tablet TAKE 1 TABLET BY MOUTH EVERY DAY 90 tablet 1   lidocaine (LIDODERM) 5 % Place 1 patch onto the skin daily. Remove & Discard patch within 12 hours or as directed by MD 30 patch 0   nitroGLYCERIN (NITROSTAT) 0.4 MG SL tablet Place 1 tablet (0.4 mg total) under the tongue every 5 (five) minutes x 3 doses as needed for chest pain. 25 tablet 12   sacubitril-valsartan (ENTRESTO) 24-26 MG Take 1 tablet by mouth 2 (two) times daily. 60 tablet 6   sertraline (ZOLOFT) 100 MG tablet Take 100 mg by mouth in the morning and at bedtime.     spironolactone (ALDACTONE) 25 MG tablet TAKE 1 TABLET (25 MG TOTAL) BY MOUTH DAILY. 90 tablet 1   No current facility-administered medications on file prior to visit.    ALLERGIES: Allergies  Allergen Reactions   Amoxicillin Other (See Comments)    REACTION: Tacycardia   Clarithromycin Hives    Breaks out and gets whelps   Meloxicam  Spiriva Respimat [Tiotropium Bromide Monohydrate] Rash    FAMILY HISTORY: Family History  Problem Relation Age of Onset   Diabetes Mother    Hypertension Mother    Hypothyroidism Mother    Lung cancer Maternal Grandmother    Breast cancer Maternal Aunt    Brain cancer Maternal Uncle    Prostate cancer Paternal Uncle    Colon cancer Neg Hx    Colon polyps Neg Hx    Esophageal cancer Neg Hx     Objective:  Blood pressure 117/75, pulse 84, height '5\' 1"'$  (1.549 m), weight 168 lb 12.8 oz (76.6 kg), SpO2 95 %. General: No acute distress.  Patient appears well-groomed.   Head:  Normocephalic/atraumatic Eyes:  fundi examined but not visualized Neck: supple, bilateral paraspinal tenderness, full range of motion Back: No paraspinal tenderness Heart: regular rate and rhythm Lungs: Clear to auscultation bilaterally. Vascular: No carotid bruits. Neurological Exam: Mental status: alert and oriented to person, place, and time, speech fluent and not dysarthric, language intact. Cranial nerves: CN I: not tested CN II:  pupils equal, round and reactive to light, visual fields intact CN III, IV, VI:  full range of motion, no nystagmus, no ptosis CN V: facial sensation intact. CN VII: upper and lower face symmetric CN VIII: hearing intact CN IX, X: gag intact, uvula midline CN XI: sternocleidomastoid and trapezius muscles intact CN XII: tongue midline Bulk & Tone: normal, no fasciculations. Motor:  muscle strength 5/5 throughout Sensation:  Pinprick, temperature and vibratory sensation intact. Deep Tendon Reflexes:  2+ throughout,  toes downgoing.   Finger to nose testing:  Without dysmetria.   Heel to shin:  Without dysmetria.   Gait:  Normal station and stride.  Romberg negative.    Thank you for allowing me to take part in the care of this patient.  Metta Clines, DO  CC: Gar Ponto, MD

## 2022-08-05 ENCOUNTER — Encounter: Payer: Self-pay | Admitting: Neurology

## 2022-08-05 ENCOUNTER — Ambulatory Visit: Payer: BC Managed Care – PPO | Admitting: Cardiology

## 2022-08-05 ENCOUNTER — Ambulatory Visit: Payer: BC Managed Care – PPO | Admitting: Neurology

## 2022-08-05 DIAGNOSIS — G43709 Chronic migraine without aura, not intractable, without status migrainosus: Secondary | ICD-10-CM | POA: Diagnosis not present

## 2022-08-05 DIAGNOSIS — G4440 Drug-induced headache, not elsewhere classified, not intractable: Secondary | ICD-10-CM

## 2022-08-05 MED ORDER — BACLOFEN 10 MG PO TABS: 10.0000 mg | ORAL_TABLET | Freq: Three times a day (TID) | ORAL | 5 refills | Status: DC | PRN

## 2022-08-05 MED ORDER — TOPIRAMATE 25 MG PO TABS: 25.0000 mg | ORAL_TABLET | Freq: Every day | ORAL | 5 refills | Status: DC

## 2022-08-05 NOTE — Patient Instructions (Signed)
  Check CT head Start topiramate '25mg'$  at bedtime.  Contact us in 4 weeks with update and we can increase dose if needed. Stop antihistamines and nasal sprays Take Tylenol or baclofen for headaches as needed.   Limit use of pain relievers to no more than 2 days out of the week.  These medications include acetaminophen, NSAIDs (ibuprofen/Advil/Motrin, naproxen/Aleve, triptans (Imitrex/sumatriptan), Excedrin, and narcotics.  This will help reduce risk of rebound headaches. Be aware of common food triggers:  - Caffeine:  coffee, black tea, cola, Mt. Dew  - Chocolate  - Dairy:  aged cheeses (brie, blue, cheddar, gouda, [place], provolone, [place], Swiss, etc), chocolate milk, buttermilk, sour cream, limit eggs and yogurt  - Nuts, peanut butter  - Alcohol  - Cereals/grains:  FRESH breads (fresh bagels, sourdough, doughnuts), yeast productions  - Processed/canned/aged/cured meats (pre-packaged deli meats, hotdogs)  - MSG/glutamate:  soy sauce, flavor enhancer, pickled/preserved/marinated foods  - Sweeteners:  aspartame (Equal, Nutrasweet).  Sugar and Splenda are okay  - Vegetables:  legumes (lima beans, lentils, snow peas, fava beans, pinto peans, peas, garbanzo beans), sauerkraut, onions, olives, pickles  - Fruit:  avocados, bananas, citrus fruit (orange, lemon, grapefruit), mango  - Other:  Frozen meals, macaroni and cheese Routine exercise Stay adequately hydrated (aim for 64 oz water daily) Keep headache diary Maintain proper stress management Maintain proper sleep hygiene Do not skip meals Consider supplements:  magnesium citrate '400mg'$  daily, riboflavin '400mg'$  daily, coenzyme Q10 '100mg'$  three times daily. Follow up 4-5 months.

## 2022-08-11 ENCOUNTER — Encounter: Payer: Self-pay | Admitting: Neurology

## 2022-08-11 NOTE — Progress Notes (Signed)
Remote ICD transmission.   

## 2022-08-15 ENCOUNTER — Emergency Department (HOSPITAL_COMMUNITY): Payer: BC Managed Care – PPO

## 2022-08-15 ENCOUNTER — Encounter (HOSPITAL_COMMUNITY): Payer: Self-pay | Admitting: *Deleted

## 2022-08-15 ENCOUNTER — Other Ambulatory Visit: Payer: Self-pay

## 2022-08-15 ENCOUNTER — Emergency Department (HOSPITAL_COMMUNITY)
Admission: EM | Admit: 2022-08-15 | Discharge: 2022-08-15 | Disposition: A | Discharge status: Home / Self Care | Payer: BC Managed Care – PPO | Attending: Emergency Medicine | Admitting: Emergency Medicine

## 2022-08-15 DIAGNOSIS — R197 Diarrhea, unspecified: Secondary | ICD-10-CM | POA: Insufficient documentation

## 2022-08-15 DIAGNOSIS — R1031 Right lower quadrant pain: Secondary | ICD-10-CM | POA: Insufficient documentation

## 2022-08-15 DIAGNOSIS — I5022 Chronic systolic (congestive) heart failure: Secondary | ICD-10-CM | POA: Insufficient documentation

## 2022-08-15 DIAGNOSIS — J45909 Unspecified asthma, uncomplicated: Secondary | ICD-10-CM | POA: Diagnosis not present

## 2022-08-15 DIAGNOSIS — R112 Nausea with vomiting, unspecified: Secondary | ICD-10-CM | POA: Insufficient documentation

## 2022-08-15 DIAGNOSIS — Z7951 Long term (current) use of inhaled steroids: Secondary | ICD-10-CM | POA: Diagnosis not present

## 2022-08-15 DIAGNOSIS — Z955 Presence of coronary angioplasty implant and graft: Secondary | ICD-10-CM | POA: Insufficient documentation

## 2022-08-15 DIAGNOSIS — R109 Unspecified abdominal pain: Secondary | ICD-10-CM | POA: Diagnosis present

## 2022-08-15 LAB — COMPREHENSIVE METABOLIC PANEL
ALT: 22 U/L (ref 0–44)
AST: 22 U/L (ref 15–41)
Albumin: 4.1 g/dL (ref 3.5–5.0)
Alkaline Phosphatase: 75 U/L (ref 38–126)
Anion gap: 8 (ref 5–15)
BUN: 11 mg/dL (ref 6–20)
CO2: 26 mmol/L (ref 22–32)
Calcium: 9 mg/dL (ref 8.9–10.3)
Chloride: 105 mmol/L (ref 98–111)
Creatinine, Ser: 1.03 mg/dL — ABNORMAL HIGH (ref 0.44–1.00)
GFR, Estimated: >60 mL/min (ref >60)
Glucose, Bld: 100 mg/dL — ABNORMAL HIGH (ref 70–99)
Potassium: 3.1 mmol/L — ABNORMAL LOW (ref 3.5–5.1)
Sodium: 139 mmol/L (ref 135–145)
Total Bilirubin: 0.5 mg/dL (ref 0.3–1.2)
Total Protein: 7.8 g/dL (ref 6.5–8.1)

## 2022-08-15 LAB — CBC WITH DIFFERENTIAL/PLATELET
Abs Immature Granulocytes: 0.01 10*3/uL (ref 0.00–0.07)
Basophils Absolute: 0 10*3/uL (ref 0.0–0.1)
Basophils Relative: 0 %
Eosinophils Absolute: 0.1 10*3/uL (ref 0.0–0.5)
Eosinophils Relative: 2 %
HCT: 40 % (ref 36.0–46.0)
Hemoglobin: 13.4 g/dL (ref 12.0–15.0)
Immature Granulocytes: 0 %
Lymphocytes Relative: 21 %
Lymphs Abs: 1.4 10*3/uL (ref 0.7–4.0)
MCH: 28.3 pg (ref 26.0–34.0)
MCHC: 33.5 g/dL (ref 30.0–36.0)
MCV: 84.6 fL (ref 80.0–100.0)
Monocytes Absolute: 0.4 10*3/uL (ref 0.1–1.0)
Monocytes Relative: 6 %
Neutro Abs: 4.9 10*3/uL (ref 1.7–7.7)
Neutrophils Relative %: 71 %
Platelets: 221 10*3/uL (ref 150–400)
RBC: 4.73 MIL/uL (ref 3.87–5.11)
RDW: 15.1 % (ref 11.5–15.5)
WBC: 6.9 10*3/uL (ref 4.0–10.5)
nRBC: 0 % (ref 0.0–0.2)

## 2022-08-15 LAB — BRAIN NATRIURETIC PEPTIDE: B Natriuretic Peptide: 13 pg/mL (ref 0.0–100.0)

## 2022-08-15 LAB — URINALYSIS, ROUTINE W REFLEX MICROSCOPIC
Bilirubin Urine: NEGATIVE
Glucose, UA: NEGATIVE mg/dL
Hgb urine dipstick: NEGATIVE
Ketones, ur: NEGATIVE mg/dL
Leukocytes,Ua: NEGATIVE
Nitrite: NEGATIVE
Protein, ur: NEGATIVE mg/dL
Specific Gravity, Urine: 1.005 (ref 1.005–1.030)
pH: 5 (ref 5.0–8.0)

## 2022-08-15 LAB — POC OCCULT BLOOD, ED: Fecal Occult Bld: POSITIVE — AB

## 2022-08-15 LAB — LIPASE, BLOOD: Lipase: 26 U/L (ref 11–51)

## 2022-08-15 MED ORDER — ONDANSETRON 4 MG PO TBDP
4.0000 mg | ORAL_TABLET | Freq: Once | ORAL | Status: AC
  Administered 2022-08-15: 4 mg via ORAL
  Filled 2022-08-15: qty 1

## 2022-08-15 MED ORDER — ONDANSETRON 4 MG PO TBDP: 4.0000 mg | ORAL_TABLET | Freq: Three times a day (TID) | ORAL | 0 refills | Status: DC | PRN

## 2022-08-15 NOTE — ED Provider Notes (Signed)
[facility] EMERGENCY DEPARTMENT Provider Note   CSN: [id number] Arrival date & time: 08/15/22  1048     History  Chief Complaint  Patient presents with   Abdominal Pain    Mary Pratt is a 51 y.o. adult with a past medical history of asthma, CHF ejection fraction 20% on echo 02/2015 presents to the emergency department for evaluation of abdominal pain, nausea, diarrhea.  Patient states she started to have right lower quadrant abdominal pain and right flank pain since yesterday.  States the pain is intermittent, dull.  She reports associated diarrhea, nausea, vomiting.  States this morning she noticed blood in her diarrhea.  History of A-fib not on blood thinner.  History of CHF on Lasix and Aldactone.  Denies fever, chest pain, shortness of breath, headache, dizziness, urinary symptoms, blood in urine, rash.  Past Medical History:  Diagnosis Date   Abnormal pap 9/13   ASCUS + HPV, 11/14 LGSIL   Anxiety    Asthma    Cardiac defibrillator in place    Chronic systolic CHF (congestive heart failure) ([place])    a. 02/2015 Echo: EF 20%, sev dil w/ diff HK, worse [facility] inf base. Tiv AI, mild MR, mod dil LA, PASP 96mHg.   Functional dyspepsia    early satiety    GERD with stricture    dilated 21540  HELICOBACTER PYLORI GASTRITIS 01/15/2009   dyspnea, ? reaction to Biaxin/amoxicillin  Pylera 01/15/09 - completed therapy      [place]    a. 02/2015 Cath: nl cors, EF 15%, mild PAH;  b. 02/2015 Enrolled in VEST trial.   [place] cell [place] ([facility]    Situational depression    "son died"   Past Surgical History:  Procedure Laterality Date   BIV ICD GENERATOR CHANGEOUT N/A 01/24/2021   Procedure: BIV ICD GENERATOR CHANGEOUT;  Surgeon: KDeboraha Sprang MD;  [facility];  Service: Cardiovascular;  Laterality: N/A;   CARDIAC CATHETERIZATION N/A 02/26/2015   Procedure: Right/Left Heart Cath and Coronary Angiography;  Surgeon: TTroy Sine MD;  [facility];  Service: Cardiovascular;  Laterality: N/A;   COLPOSCOPY  2013   neg   EP IMPLANTABLE DEVICE N/A 10/14/2015   Procedure: BiV ICD Insertion CRT-D;  Surgeon: SDeboraha Sprang MD;  [facility];  Service: Cardiovascular;  Laterality: N/A;   ESOPHAGOGASTRODUODENOSCOPY (EGD) WITH ESOPHAGEAL DILATION  11/30/2008   esophageal ring dilated 588French, H. pylori gastritis   TUBAL LIGATION  1991   WISDOM TOOTH EXTRACTION       Home Medications Prior to Admission medications   Medication Sig Start Date End Date Taking? Authorizing Provider  albuterol (PROVENTIL HFA;VENTOLIN HFA) 108 (90 BASE) MCG/ACT inhaler Inhale into the lungs every 6 (six) hours as needed for wheezing or shortness of breath.   Yes [provider]  baclofen (LIORESAL) 10 MG tablet Take 1 tablet (10 mg total) by mouth 3 (three) times daily as needed for muscle spasms. 08/05/22  Yes Jaffe, Adam R, DO  carvedilol (COREG) 25 MG tablet TAKE 1 TABLET (25 MG TOTAL) BY MOUTH TWICE A DAY WITH MEALS Patient taking differently: Take 25 mg by mouth 2 (two) times daily with a meal. 03/11/22  Yes Branch, JAlphonse Guild MD  Fluticasone-Salmeterol (ADVAIR DISKUS) 250-50 MCG/DOSE AEPB Inhale 1 puff into the lungs 2 (two) times daily. 03/23/13  Yes [name], KArmando Reichert MD  furosemide (LASIX) 20 MG tablet TAKE 1  TABLET BY MOUTH EVERY DAY Patient taking differently: Take 20 mg by mouth daily. 03/02/22  Yes Branch, Alphonse Guild, MD  nitroGLYCERIN (NITROSTAT) 0.4 MG SL tablet Place 1 tablet (0.4 mg total) under the tongue every 5 (five) minutes x 3 doses as needed for chest pain. 06/25/21  Yes Bhagat, Bhavinkumar, PA  ondansetron (ZOFRAN-ODT) 4 MG disintegrating tablet Take 1 tablet (4 mg total) by mouth every 8 (eight) hours as needed for nausea or vomiting. 08/15/22  Yes Rex Kras, PA  sacubitril-valsartan (ENTRESTO) 24-26 MG Take 1 tablet by mouth 2 (two) times daily. 01/05/22  Yes BranchAlphonse Guild, MD  sertraline (ZOLOFT) 100  MG tablet Take 100 mg by mouth in the morning and at bedtime. 01/24/20  Yes [provider]  spironolactone (ALDACTONE) 25 MG tablet TAKE 1 TABLET (25 MG TOTAL) BY MOUTH DAILY. 06/22/22  Yes BranchAlphonse Guild, MD  topiramate (TOPAMAX) 25 MG tablet Take 1 tablet (25 mg total) by mouth at bedtime. 08/05/22  Yes Tomi Likens, Adam R, DO      Allergies    Amoxicillin, Clarithromycin, Meloxicam, and Spiriva respimat [tiotropium bromide monohydrate]    Review of Systems   Review of Systems  Gastrointestinal:  Positive for abdominal pain, blood in stool, diarrhea, nausea and vomiting.    Physical Exam Updated Vital Signs BP 110/82 (BP Location: Left Arm)   Pulse 85   Temp 98.3 F (36.8 C) (Oral)   Resp 17   Ht '5\' 1"'$  (1.549 m)   Wt 72.1 kg   SpO2 98%   BMI 30.04 kg/m  Physical Exam Vitals and nursing note reviewed.  Constitutional:      Appearance: Normal appearance.  HENT:     Head: Normocephalic and atraumatic.     Mouth/Throat:     Mouth: Mucous membranes are moist.  Eyes:     General: No scleral icterus. Cardiovascular:     Rate and Rhythm: Normal rate and regular rhythm.     Pulses: Normal pulses.     Heart sounds: Normal heart sounds.  Pulmonary:     Effort: Pulmonary effort is normal.     Breath sounds: Normal breath sounds.  Abdominal:     General: Abdomen is flat.     Palpations: Abdomen is soft.     Tenderness: There is abdominal tenderness in the right lower quadrant.  Musculoskeletal:        General: No deformity.  Skin:    General: Skin is warm.     Findings: No rash.  Neurological:     General: No focal deficit present.     Mental Status: She is alert.  Psychiatric:        Mood and Affect: Mood normal.     ED Results / Procedures / Treatments   Labs (all labs ordered are listed, but only abnormal results are displayed) Labs Reviewed  COMPREHENSIVE METABOLIC PANEL - Abnormal; Notable for the following components:      Result Value   Potassium  3.1 (*)    Glucose, Bld 100 (*)    Creatinine, Ser 1.03 (*)    All other components within normal limits  URINALYSIS, ROUTINE W REFLEX MICROSCOPIC - Abnormal; Notable for the following components:   Color, Urine COLORLESS (*)    All other components within normal limits  POC OCCULT BLOOD, ED - Abnormal; Notable for the following components:   Fecal Occult Bld POSITIVE (*)    All other components within normal limits  CBC WITH DIFFERENTIAL/PLATELET  LIPASE,  BLOOD  BRAIN NATRIURETIC PEPTIDE    EKG None  Radiology CT ABDOMEN PELVIS WO CONTRAST  Result Date: 08/15/2022 CLINICAL DATA:  Right lower quadrant abdominal pain with nausea, vomiting, diarrhea, and bright red blood in the stool. EXAM: CT ABDOMEN AND PELVIS WITHOUT CONTRAST TECHNIQUE: Multidetector CT imaging of the abdomen and pelvis was performed following the standard protocol without IV contrast. RADIATION DOSE REDUCTION: This exam was performed according to the departmental dose-optimization program which includes automated exposure control, adjustment of the mA and/or kV according to patient size and/or use of iterative reconstruction technique. COMPARISON:  CT abdomen report from 05/16/2012 FINDINGS: Lower chest: AICD leads noted. Hepatobiliary: Fluid density 1.2 cm lesion in segment 2 of the liver on image 11 series 3, most likely to be a cyst. Nonspecific hypodense 1.1 by 0.7 by 0.6 cm lesion in segment 4a of the liver on image 15 series 3, although statistically likely to be benign. Gallbladder unremarkable. Pancreas: Unremarkable Spleen: Unremarkable Adrenals/Urinary Tract: Fluid density 1.2 cm lesion of the right kidney upper pole on image 19 series 3, most likely to be a Bosniak category 1 cyst. No further imaging workup of this lesion is indicated. Urinary bladder and adrenal glands appear unremarkable. Stomach/Bowel: Small amount of flocculated barium in the bowel including the distal ileum and proximal colon. No dilated bowel  normal appendix. Paucity of gas in the distal colon. Vascular/Lymphatic: Unremarkable Reproductive: 2.5 by 2.3 cm left adnexal/ovarian cystic lesion with fluid-fluid level as shown on image 57 series 3, probably a proteinaceous or hemorrhagic cyst and highly likely to be benign given the appearance. 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 Musculoskeletal: 3 mm of grade 1 degenerative retrolisthesis at L4-5, subligamentous disc bulge at L4-5 without impingement. Moderate degenerative arthropathy both hips. IMPRESSION: 1. A specific cause for the patient's right lower quadrant abdominal pain is not identified. The appendix appears normal. Fluid-filled normal caliber distal colon, cannot exclude low-grade distal colitis. 2. 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 3. 2.5 cm left adnexal/ovarian cystic lesion with fluid-fluid level, probably a proteinaceous or hemorrhagic cyst and highly likely to be benign given the appearance. 4. Moderate degenerative arthropathy both hips. Electronically Signed   By: Van Clines M.D.   On: 08/15/2022 12:57    Procedures Procedures    Medications Ordered in ED Medications  ondansetron (ZOFRAN-ODT) disintegrating tablet 4 mg (4 mg Oral Given 08/15/22 1230)    ED Course/ Medical Decision Making/ A&P                           Medical Decision Making Amount and/or Complexity of Data Reviewed Labs: ordered. Radiology: ordered.  Risk Prescription drug management.   This patient presents to the ED for right lower quadrant abdominal pain, right flank pain, nausea, vomiting, bloody diarrhea, this involves an extensive number of treatment options, and is a complaint that carries with a high risk of complications and morbidity.  The differential diagnosis includes  appendicitis, diverticulitis, kidney stone, pyelonephritis, UTI, PID, ovarian torsion, diverticulosis, mesenteric ischemia, infectious etiology.  This is not an exhaustive list.  Comorbidities that complicate the patient evaluation See HPI  Social determinants of health NA  Additional history obtained: External records from outside source obtained and reviewed including: Chart review including previous notes, labs, imaging.  Cardiac monitoring/EKG: The patient was maintained on  a cardiac monitor.  I personally reviewed and interpreted the cardiac monitor which showed an underlying rhythm of: Sinus rhythm.  Lab tests: I ordered and personally interpreted labs.  The pertinent results include: WBC unremarkable. Hbg unremarkable. Platelets unremarkable. Potassium 3.1. BUN, creatinine unremarkable. UA significant for no acute abnormality.   Imaging studies: I ordered imaging studies including: CT scan abdomen pelvis showed low-grade sclerosis mesenteritis, 2.5 cm left adnexal ovarian cystic lesion highly likely to be benign and moderate degenerative arthropathy of both hips. I personally reviewed, interpreted imaging and agree with the radiologist's interpretations.  Problem list/ ED course/ Critical interventions/ Medical management: HPI: See above Vital signs within normal range and stable throughout visit. Laboratory/imaging studies significant for: See above. On physical examination, patient is afebrile and appears in no acute distress.  There was tenderness to palpation to right lower quadrant and right flank.  Negative CVA tenderness. CT scan abdomen pelvis showed low-grade sclerosis mesenteritis, 2.5 cm left adnexal ovarian cystic lesion highly likely to be benign and moderate degenerative arthropathy of both hips.  Specific cause of the patient's right lower abdominal pain is not identified on CT scan.  The appendix appears normal.  No evidence of appendicitis or diverticulitis.   Based on patient's clinical presentations and laboratory/imaging studies I suspect musculoskeletal pain. Reevaluation of the patient after these medications showed that the patient improved. She tolerated PO well at this point. No sign of anemia on CBC. Advised patient to take Zofran for nausea and vomiting.  Take Tylenol/ibuprofen for pain.  Follow-up with GI or PCP for further evaluation and management.  Return to ER if new or worsening symptoms.. I have reviewed the patient home medicines and have made adjustments as needed.  Consultations obtained:  Disposition Continued outpatient therapy. Follow-up with Gi or PCP recommended for reevaluation of symptoms. Treatment plan discussed with patient.  Pt acknowledged understanding was agreeable to the plan. Worrisome signs and symptoms were discussed with patient, and patient acknowledged understanding to return to the ED if they noticed these signs and symptoms. Patient was stable upon discharge.   This chart was dictated using voice recognition software.  Despite best efforts to proofread,  errors can occur which can change the documentation meaning.          Final Clinical Impression(s) / ED Diagnoses Final diagnoses:  Right lower quadrant abdominal pain    Rx / DC Orders ED Discharge Orders          Ordered    ondansetron (ZOFRAN-ODT) 4 MG disintegrating tablet  Every 8 hours PRN        08/15/22 1354              Rex Kras, PA 08/15/22 2119    Isla Pence, MD 08/16/22 [phone]

## 2022-08-15 NOTE — ED Triage Notes (Signed)
Pt with abd pain with N/V/D, blood in stool-bright red in color.  Emesis and diarrhea started yesterday.  Blood noted this morning. Pt recently started medication for migraine HA's per pt.

## 2022-08-15 NOTE — Discharge Instructions (Addendum)
Please take your medications as prescribed. Please take tylenol/ibuprofen for pain. I recommend close follow-up with PCP for reevaluation.  Please do not hesitate to return to emergency department if worrisome signs symptoms we discussed become apparent.

## 2022-08-15 NOTE — ED Notes (Signed)
Urine collected in room, if needed.

## 2022-08-16 ENCOUNTER — Other Ambulatory Visit: Payer: Self-pay

## 2022-08-16 ENCOUNTER — Emergency Department (HOSPITAL_COMMUNITY)
Admission: EM | Admit: 2022-08-16 | Discharge: 2022-08-16 | Disposition: A | Discharge status: Home / Self Care | Payer: BC Managed Care – PPO | Attending: Emergency Medicine | Admitting: Emergency Medicine

## 2022-08-16 DIAGNOSIS — J45909 Unspecified asthma, uncomplicated: Secondary | ICD-10-CM | POA: Insufficient documentation

## 2022-08-16 DIAGNOSIS — K922 Gastrointestinal hemorrhage, unspecified: Secondary | ICD-10-CM

## 2022-08-16 DIAGNOSIS — Z95810 Presence of automatic (implantable) cardiac defibrillator: Secondary | ICD-10-CM | POA: Diagnosis not present

## 2022-08-16 DIAGNOSIS — I5022 Chronic systolic (congestive) heart failure: Secondary | ICD-10-CM | POA: Diagnosis not present

## 2022-08-16 DIAGNOSIS — Z79899 Other long term (current) drug therapy: Secondary | ICD-10-CM | POA: Diagnosis not present

## 2022-08-16 DIAGNOSIS — R109 Unspecified abdominal pain: Secondary | ICD-10-CM | POA: Diagnosis present

## 2022-08-16 LAB — URINALYSIS, ROUTINE W REFLEX MICROSCOPIC
Bilirubin Urine: NEGATIVE
Glucose, UA: NEGATIVE mg/dL
Hgb urine dipstick: NEGATIVE
Ketones, ur: NEGATIVE mg/dL
Leukocytes,Ua: NEGATIVE
Nitrite: NEGATIVE
Protein, ur: NEGATIVE mg/dL
Specific Gravity, Urine: 1.012 (ref 1.005–1.030)
pH: 5 (ref 5.0–8.0)

## 2022-08-16 LAB — CBC WITH DIFFERENTIAL/PLATELET
Abs Immature Granulocytes: 0.01 10*3/uL (ref 0.00–0.07)
Basophils Absolute: 0 10*3/uL (ref 0.0–0.1)
Basophils Relative: 0 %
Eosinophils Absolute: 0.2 10*3/uL (ref 0.0–0.5)
Eosinophils Relative: 4 %
HCT: 37.6 % (ref 36.0–46.0)
Hemoglobin: 12.4 g/dL (ref 12.0–15.0)
Immature Granulocytes: 0 %
Lymphocytes Relative: 33 %
Lymphs Abs: 1.8 10*3/uL (ref 0.7–4.0)
MCH: 28.3 pg (ref 26.0–34.0)
MCHC: 33 g/dL (ref 30.0–36.0)
MCV: 85.8 fL (ref 80.0–100.0)
Monocytes Absolute: 0.4 10*3/uL (ref 0.1–1.0)
Monocytes Relative: 8 %
Neutro Abs: 2.9 10*3/uL (ref 1.7–7.7)
Neutrophils Relative %: 55 %
Platelets: 219 10*3/uL (ref 150–400)
RBC: 4.38 MIL/uL (ref 3.87–5.11)
RDW: 15 % (ref 11.5–15.5)
WBC: 5.3 10*3/uL (ref 4.0–10.5)
nRBC: 0 % (ref 0.0–0.2)

## 2022-08-16 LAB — COMPREHENSIVE METABOLIC PANEL
ALT: 19 U/L (ref 0–44)
AST: 19 U/L (ref 15–41)
Albumin: 3.7 g/dL (ref 3.5–5.0)
Alkaline Phosphatase: 63 U/L (ref 38–126)
Anion gap: 7 (ref 5–15)
BUN: 11 mg/dL (ref 6–20)
CO2: 24 mmol/L (ref 22–32)
Calcium: 8.8 mg/dL — ABNORMAL LOW (ref 8.9–10.3)
Chloride: 107 mmol/L (ref 98–111)
Creatinine, Ser: 1.08 mg/dL — ABNORMAL HIGH (ref 0.44–1.00)
GFR, Estimated: >60 mL/min (ref >60)
Glucose, Bld: 93 mg/dL (ref 70–99)
Potassium: 3.2 mmol/L — ABNORMAL LOW (ref 3.5–5.1)
Sodium: 138 mmol/L (ref 135–145)
Total Bilirubin: 0.5 mg/dL (ref 0.3–1.2)
Total Protein: 7 g/dL (ref 6.5–8.1)

## 2022-08-16 LAB — LIPASE, BLOOD: Lipase: 29 U/L (ref 11–51)

## 2022-08-16 MED ORDER — SODIUM CHLORIDE 0.9 % IV BOLUS
1000.0000 mL | Freq: Once | INTRAVENOUS | Status: AC
  Administered 2022-08-16: 1000 mL via INTRAVENOUS

## 2022-08-16 MED ORDER — ONDANSETRON 4 MG PO TBDP
4.0000 mg | ORAL_TABLET | Freq: Once | ORAL | Status: AC
Start: 2022-08-16 — End: 2022-08-16
  Administered 2022-08-16: 4 mg via ORAL
  Filled 2022-08-16: qty 1

## 2022-08-16 MED ORDER — DICYCLOMINE HCL 10 MG PO CAPS
10.0000 mg | ORAL_CAPSULE | Freq: Once | ORAL | Status: AC
Start: 2022-08-16 — End: 2022-08-16
  Administered 2022-08-16: 10 mg via ORAL
  Filled 2022-08-16: qty 1

## 2022-08-16 MED ORDER — PROCHLORPERAZINE MALEATE 10 MG PO TABS: 10.0000 mg | ORAL_TABLET | Freq: Three times a day (TID) | ORAL | 0 refills | Status: DC | PRN

## 2022-08-16 MED ORDER — DICYCLOMINE HCL 20 MG PO TABS: 20.0000 mg | ORAL_TABLET | Freq: Three times a day (TID) | ORAL | 0 refills | Status: DC | PRN

## 2022-08-16 NOTE — ED Provider Triage Note (Signed)
Emergency Medicine Provider Triage Evaluation Note  Mary Pratt , a 51 y.o. adult  was evaluated in triage.  Pt complains of epigastric abdominal pain and melena.  Patient was seen in the emergency department yesterday for bright red blood per rectum as well as abdominal pain with imaging and laboratory studies available.  Patient states that since then, abdominal pain has been persistent as well as has noticed stool changed to black appearance.  Denies fever, chills, night sweats, chest pain, shortness of breath, urinary symptoms, nausea, vomiting.  Denies marijuana or alcohol use.  Review of Systems  Positive: See above Negative:   Physical Exam  BP 105/79 (BP Location: Left Arm)   Pulse 69   Temp 98.3 F (36.8 C) (Oral)   Resp 17   Ht '5\' 1"'$  (1.549 m)   Wt 72 kg   SpO2 96%   BMI 29.99 kg/m  Gen:   Awake, no distress   Resp:  Normal effort  MSK:   Moves extremities without difficulty  Other:  Epigastric abdominal pain.  Medical Decision Making  Medically screening exam initiated at 3:38 PM.  Appropriate orders placed.  Mary Pratt was informed that the remainder of the evaluation will be completed by another provider, this initial triage assessment does not replace that evaluation, and the importance of remaining in the ED until their evaluation is complete.     Mary Pratt, [place] 08/16/22 1540

## 2022-08-16 NOTE — Discharge Instructions (Addendum)
Follow-up with gastroenterology.  Return for lightheadedness or dizziness.  Try and keep yourself hydrated.

## 2022-08-16 NOTE — ED Triage Notes (Signed)
Pt with abd pain reports bright red blood in stool yesterday but noticed dark red blood today. Vomiting and diarrhea x 2 days.

## 2022-08-16 NOTE — ED Provider Notes (Signed)
[facility] DEPT Provider Note   CSN: [id number] Arrival date & time: 08/16/22  1502     History  Chief Complaint  Patient presents with   Abdominal Pain    Mary Pratt is a 51 y.o. adult.   Abdominal Pain Patient presents abdominal pain.  Has had for around 3 days now.  Seen in the ER yesterday.  Had CT scan without contrast that showed potentially a sclerosing mesenteritis.  Colon looked good however.  Appendix visualized and reassuring.  Had some blood in the stool yesterday.  Hemoglobin reassuring.  White count reassuring then.  Came back in for continued pain.  States still having some diarrhea.  Still nausea vomiting decreased appetite and crampy abdomen.    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 02/2015 Echo: EF 20%, sev dil w/ diff HK, worse @ inf base. Tiv AI, mild MR, mod dil LA, PASP 35mHg.   Functional dyspepsia    early satiety    GERD with stricture    dilated 27062  HELICOBACTER PYLORI GASTRITIS 01/15/2009   dyspnea, ? reaction to Biaxin/amoxicillin  Pylera 01/15/09 - completed therapy      [place]    a. 02/2015 Cath: nl cors, EF 15%, mild PAH;  b. 02/2015 Enrolled in VEST trial.   [place]    Situational depression    "son died"    Home Medications Prior to Admission medications   Medication Sig Start Date End Date Taking? Authorizing Provider  albuterol (PROVENTIL HFA;VENTOLIN HFA) 108 (90 BASE) MCG/ACT inhaler Inhale 2 puffs into the lungs every 6 (six) hours as needed for wheezing or shortness of breath.   Yes [provider]  baclofen (LIORESAL) 10 MG tablet Take 1 tablet (10 mg total) by mouth 3 (three) times daily as needed for muscle spasms. 08/05/22  Yes Jaffe, Adam R, DO  carvedilol (COREG) 25 MG  tablet TAKE 1 TABLET (25 MG TOTAL) BY MOUTH TWICE A DAY WITH MEALS Patient taking differently: Take 25 mg by mouth 2 (two) times daily with a meal. 03/11/22  Yes Branch, JAlphonse Guild MD  dicyclomine (BENTYL) 20 MG tablet Take 1 tablet (20 mg total) by mouth 3 (three) times daily as needed for spasms. 08/16/22  Yes PDavonna Belling MD  fluocinonide ointment (LIDEX) 03.76% Apply 1 Application topically daily as needed (eczema).   Yes [provider]  Fluticasone-Salmeterol (ADVAIR DISKUS) 250-50 MCG/DOSE AEPB Inhale 1 puff into the lungs 2 (two) times daily. 03/23/13  Yes [name], KArmando Reichert MD  furosemide (LASIX) 20 MG tablet TAKE 1 TABLET BY MOUTH EVERY DAY Patient taking differently: Take 20 mg by mouth daily. 03/02/22  Yes Branch,Alphonse Guild MD  hydrocortisone 2.5 % cream Apply 1 Application topically 2 (two) times daily.   Yes [provider]  nitroGLYCERIN (NITROSTAT) 0.4 MG SL tablet Place 1 tablet (0.4 mg total) under the tongue every 5 (five) minutes x 3 doses as needed for chest pain. 06/25/21  Yes Bhagat, Bhavinkumar, PA  ondansetron (ZOFRAN-ODT) 4 MG disintegrating tablet Take 1 tablet (4 mg total) by mouth every 8 (eight) hours as needed for nausea or vomiting. 08/15/22  Yes LRex Kras PA  pantoprazole (PROTONIX) 40 MG tablet Take 40  mg by mouth daily.   Yes [provider]  prochlorperazine (COMPAZINE) 10 MG tablet Take 1 tablet (10 mg total) by mouth every 8 (eight) hours as needed for nausea or vomiting. 08/16/22  Yes Davonna Belling, MD  sacubitril-valsartan (ENTRESTO) 24-26 MG Take 1 tablet by mouth 2 (two) times daily. 01/05/22  Yes BranchAlphonse Guild, MD  sertraline (ZOLOFT) 100 MG tablet Take 100 mg by mouth daily. 01/24/20  Yes [provider]  spironolactone (ALDACTONE) 25 MG tablet TAKE 1 TABLET (25 MG TOTAL) BY MOUTH DAILY. 06/22/22  Yes BranchAlphonse Guild, MD  topiramate (TOPAMAX) 25 MG tablet Take 1 tablet (25 mg total) by mouth at bedtime. 08/05/22   Yes Jaffe, Adam R, DO      Allergies    Amoxicillin, Clarithromycin, Meloxicam, and Spiriva respimat [tiotropium bromide monohydrate]    Review of Systems   Review of Systems  Gastrointestinal:  Positive for abdominal pain.    Physical Exam Updated Vital Signs BP 109/65   Pulse 62   Temp 98.3 F (36.8 C)   Resp 18   Ht '5\' 1"'$  (1.549 m)   Wt 72 kg   SpO2 98%   BMI 29.99 kg/m  Physical Exam Vitals and nursing note reviewed.  HENT:     Head: Normocephalic.  Cardiovascular:     Rate and Rhythm: Regular rhythm.  Chest:     Chest wall: No tenderness.  Abdominal:     Tenderness: There is no abdominal tenderness.  Musculoskeletal:        General: No tenderness.     Cervical back: Neck supple.  Skin:    General: Skin is warm.  Neurological:     Mental Status: She is alert and oriented to person, place, and time.     ED Results / Procedures / Treatments   Labs (all labs ordered are listed, but only abnormal results are displayed) Labs Reviewed  COMPREHENSIVE METABOLIC PANEL - Abnormal; Notable for the following components:      Result Value   Potassium 3.2 (*)    Creatinine, Ser 1.08 (*)    Calcium 8.8 (*)    All other components within normal limits  URINALYSIS, ROUTINE W REFLEX MICROSCOPIC - Abnormal; Notable for the following components:   APPearance HAZY (*)    All other components within normal limits  CBC WITH DIFFERENTIAL/PLATELET  LIPASE, BLOOD    EKG None  Radiology CT ABDOMEN PELVIS WO CONTRAST  Result Date: 08/15/2022 CLINICAL DATA:  Right lower quadrant abdominal pain with nausea, vomiting, diarrhea, and bright red blood in the stool. EXAM: CT ABDOMEN AND PELVIS WITHOUT CONTRAST TECHNIQUE: Multidetector CT imaging of the abdomen and pelvis was performed following the standard protocol without IV contrast. RADIATION DOSE REDUCTION: This exam was performed according to the departmental dose-optimization program which includes automated exposure  control, adjustment of the mA and/or kV according to patient size and/or use of iterative reconstruction technique. COMPARISON:  CT abdomen report from 05/16/2012 FINDINGS: Lower chest: AICD leads noted. Hepatobiliary: Fluid density 1.2 cm lesion in segment 2 of the liver on image 11 series 3, most likely to be a cyst. Nonspecific hypodense 1.1 by 0.7 by 0.6 cm lesion in segment 4a of the liver on image 15 series 3, although statistically likely to be benign. Gallbladder unremarkable. Pancreas: Unremarkable Spleen: Unremarkable Adrenals/Urinary Tract: Fluid density 1.2 cm lesion of the right kidney upper pole on image 19 series 3, most likely to be a Bosniak category 1 cyst. No further  imaging workup of this lesion is indicated. Urinary bladder and adrenal glands appear unremarkable. Stomach/Bowel: Small amount of flocculated barium in the bowel including the distal ileum and proximal colon. No dilated bowel normal appendix. Paucity of gas in the distal colon. Vascular/Lymphatic: Unremarkable Reproductive: 2.5 by 2.3 cm left adnexal/ovarian cystic lesion with fluid-fluid level as shown on image 57 series 3, probably a proteinaceous or hemorrhagic cyst and highly likely to be benign given the appearance. 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 Musculoskeletal: 3 mm of grade 1 degenerative retrolisthesis at L4-5, subligamentous disc bulge at L4-5 without impingement. Moderate degenerative arthropathy both hips. IMPRESSION: 1. A specific cause for the patient's right lower quadrant abdominal pain is not identified. The appendix appears normal. Fluid-filled normal caliber distal colon, cannot exclude low-grade distal colitis. 2. 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 3. 2.5 cm left adnexal/ovarian cystic lesion with  fluid-fluid level, probably a proteinaceous or hemorrhagic cyst and highly likely to be benign given the appearance. 4. Moderate degenerative arthropathy both hips. Electronically Signed   By: Van Clines M.D.   On: 08/15/2022 12:57    Procedures Procedures    Medications Ordered in ED Medications  ondansetron (ZOFRAN-ODT) disintegrating tablet 4 mg (4 mg Oral Given 08/16/22 2136)  sodium chloride 0.9 % bolus 1,000 mL (0 mLs Intravenous Stopped 08/16/22 2328)  dicyclomine (BENTYL) capsule 10 mg (10 mg Oral Given 08/16/22 2136)    ED Course/ Medical Decision Making/ A&P                           Medical Decision Making Risk Prescription drug management.   Patient abdominal pain.  Seen yesterday for same.  CT scan done then.  Lab work reassuring today.  Hemoglobin reassuring.  Has had positive Hemoccult yesterday.  Has seen Dr. Arelia Longest from GI in the past.  Will give fluid bolus and do some antispasmodic.  If feels better tolerates oral hopefully potential GI follow-up.  If continued symptoms may need scan with contrast.  Discussed with patient.  Feeling somewhat better.  Still some aching.  However no tenderness.  Did have potentially sclerosing mesenteritis on scan although is noncontrast yesterday.  Discussed about a scan with contrast but I think we can hold off with for GI follow-up.        Final Clinical Impression(s) / ED Diagnoses Final diagnoses:  Abdominal pain, unspecified abdominal location  Gastrointestinal hemorrhage, unspecified gastrointestinal hemorrhage type    Rx / DC Orders ED Discharge Orders          Ordered    dicyclomine (BENTYL) 20 MG tablet  3 times daily PRN        08/16/22 2308    prochlorperazine (COMPAZINE) 10 MG tablet  Every 8 hours PRN        08/16/22 2308              Davonna Belling, MD 08/16/22 2345

## 2022-08-16 NOTE — ED Notes (Signed)
Patient offered food and a beverage.

## 2022-08-17 ENCOUNTER — Telehealth: Payer: Self-pay | Admitting: Internal Medicine

## 2022-08-17 NOTE — Telephone Encounter (Signed)
Inbound call from patient stating she is experiencing a gi bleed and some abdominal pain. Patient scheduled for next available f/u. Please advise.

## 2022-08-18 NOTE — Telephone Encounter (Signed)
Patient returning call. Please advise

## 2022-08-18 NOTE — Telephone Encounter (Signed)
Pt stated that she is requesting a sooner appointment: Recently been to the ED twice for abdominal pain, nausea, diarrhea, gas and dark stools:  Pt was rescheduled to see Alonza Bogus PA on 08/21/2022 at 9:00 AM: Pt made aware: Pt verbalized understanding with all questions answered.

## 2022-08-18 NOTE — Telephone Encounter (Signed)
Left message for pt to cal back. 

## 2022-08-19 ENCOUNTER — Ambulatory Visit
Admission: RE | Admit: 2022-08-19 | Discharge: 2022-08-19 | Disposition: A | Discharge status: Home / Self Care | Payer: BC Managed Care – PPO | Source: Ambulatory Visit | Attending: Neurology | Admitting: Neurology

## 2022-08-19 DIAGNOSIS — G4440 Drug-induced headache, not elsewhere classified, not intractable: Secondary | ICD-10-CM

## 2022-08-19 DIAGNOSIS — G43709 Chronic migraine without aura, not intractable, without status migrainosus: Secondary | ICD-10-CM

## 2022-08-21 ENCOUNTER — Ambulatory Visit (INDEPENDENT_AMBULATORY_CARE_PROVIDER_SITE_OTHER): Payer: BC Managed Care – PPO | Admitting: Gastroenterology

## 2022-08-21 ENCOUNTER — Telehealth: Payer: Self-pay | Admitting: Internal Medicine

## 2022-08-21 ENCOUNTER — Encounter: Payer: Self-pay | Admitting: Gastroenterology

## 2022-08-21 VITALS — BP 116/82 | HR 68 | Ht 61.0 in | Wt 165.5 lb

## 2022-08-21 DIAGNOSIS — R197 Diarrhea, unspecified: Secondary | ICD-10-CM

## 2022-08-21 DIAGNOSIS — R109 Unspecified abdominal pain: Secondary | ICD-10-CM | POA: Diagnosis not present

## 2022-08-21 DIAGNOSIS — K654 Sclerosing mesenteritis: Secondary | ICD-10-CM

## 2022-08-21 MED ORDER — METHYLPREDNISOLONE 4 MG PO TBPK: ORAL_TABLET | ORAL | 0 refills | Status: DC

## 2022-08-21 NOTE — Telephone Encounter (Signed)
The pt wanted to make sure that Dr Carlean Purl will review the note from today.  I did confirm that he will review.  The pt has been advised of the information and verbalized understanding.

## 2022-08-21 NOTE — Patient Instructions (Addendum)
We have sent the following medications to your pharmacy for you to pick up at your convenience: Medrol Dose Pak as directed  Please call/Mychart our office mid-next week to let us know how you are doing.  _______________________________________________________  Pick up the dicyclomine sent by the emergency room and take as directed. _______________________________________________________  If you are age 53 or older, your body mass index should be between 23-30. Your Body mass index is 31.27 kg/m. If this is out of the aforementioned range listed, please consider follow up with your Primary Care Provider.  If you are age 21 or younger, your body mass index should be between 19-25. Your Body mass index is 31.27 kg/m. If this is out of the aformentioned range listed, please consider follow up with your Primary Care Provider.   ________________________________________________________  The [facility] providers would like to encourage you to use [facility] to communicate with providers for non-urgent requests or questions.  Due to long hold times on the telephone, sending your provider a message by [facility] may be a faster and more efficient way to get a response.  Please allow 48 business hours for a response.  Please remember that this is for non-urgent requests.  _______________________________________________________  Due to recent changes in healthcare laws, you may see the results of your imaging and laboratory studies on MyChart before your provider has had a chance to review them.  We understand that in some cases there may be results that are confusing or concerning to you. Not all laboratory results come back in the same time frame and the provider may be waiting for multiple results in order to interpret others.  Please give [place] 48 hours in order for your provider to thoroughly review all the results before contacting the office for clarification of your results.

## 2022-08-21 NOTE — Telephone Encounter (Signed)
Patient is calling states she has questions regarding her appointment today and is wishing to speak to a nurse. Please advise

## 2022-08-21 NOTE — Progress Notes (Signed)
08/21/2022 Mary Pratt [id number] 01-11-1971   HISTORY OF PRESENT ILLNESS: This is a 51 year old female who is a patient Dr. Celesta Aver.  Last seen here October 2022.  She told me last week that she had onset of right-sided abdominal pain, nausea, and vomiting, diarrhea, rectal bleeding.  She went to the emergency department.  White blood cell count and hemoglobin were normal.  She was Hemoccult positive.  Lipase was normal.  BUN was normal.  LFTs are normal.  CT results as below.  Looks like they gave her dicyclomine and Compazine.  Says that she did not get the dicyclomine.  She is here today for follow-up.  She says that she is feeling somewhat better, but still has some abdominal pain, nausea, and just fatigue and does not feel great.  A lot of abdominal bloating.  Says that her stools are now dark.  Diarrhea is better.  CT scan abdomen and pelvis with contrast:  IMPRESSION: 1. A specific cause for the patient's right lower quadrant abdominal pain is not identified. The appendix appears normal. Fluid-filled normal caliber distal colon, cannot exclude low-grade distal colitis. 2. 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 3. 2.5 cm left adnexal/ovarian cystic lesion with fluid-fluid level, probably a proteinaceous or hemorrhagic cyst and highly likely to be benign given the appearance. 4. Moderate degenerative arthropathy both hips.    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 02/2015 Echo: EF 20%, sev dil w/ diff HK, worse @ inf base. Tiv AI, mild MR, mod dil LA, PASP 56mHg.   Functional dyspepsia    early satiety    GERD with stricture    dilated 28416  HELICOBACTER PYLORI GASTRITIS 01/15/2009   dyspnea, ? reaction to  Biaxin/amoxicillin  Pylera 01/15/09 - completed therapy      [place]    a. 02/2015 Cath: nl cors, EF 15%, mild PAH;  b. 02/2015 Enrolled in VEST trial.   [place]    Situational depression    "son died"   Past Surgical History:  Procedure Laterality Date   BIV ICD GENERATOR CHANGEOUT N/A 01/24/2021   Procedure: BIV ICD GENERATOR CHANGEOUT;  Surgeon: KDeboraha Sprang MD;  [facility];  Service: Cardiovascular;  Laterality: N/A;   CARDIAC CATHETERIZATION N/A 02/26/2015   Procedure: Right/Left Heart Cath and Coronary Angiography;  Surgeon: TTroy Sine MD;  [facility];  Service: Cardiovascular;  Laterality: N/A;   COLPOSCOPY  2013   neg   EP IMPLANTABLE DEVICE N/A 10/14/2015   Procedure: BiV ICD Insertion CRT-D;  Surgeon: SDeboraha Sprang MD;  [facility];  Service: Cardiovascular;  Laterality: N/A;   ESOPHAGOGASTRODUODENOSCOPY (EGD) WITH ESOPHAGEAL DILATION  11/30/2008   esophageal ring dilated 513French, H. pylori gastritis   TUBAL LIGATION  1991   WISDOM TOOTH EXTRACTION      reports that she has never smoked. She has never used smokeless tobacco. She reports current alcohol use. She reports that she does not use drugs. family history includes Brain cancer in her maternal uncle; Breast cancer in her maternal aunt; Diabetes in her mother; Hypertension in her mother; Hypothyroidism in  her mother; Lung cancer in her maternal grandmother; Prostate cancer in her paternal uncle. Allergies  Allergen Reactions   Amoxicillin Other (See Comments)    REACTION: Tacycardia   Clarithromycin Hives    Breaks out and gets whelps   Meloxicam Rash   Spiriva Respimat [Tiotropium Bromide Monohydrate] Rash      Outpatient Encounter Medications as of 08/21/2022  Medication Sig   albuterol (PROVENTIL HFA;VENTOLIN HFA) 108 (90 BASE) MCG/ACT inhaler Inhale 2 puffs into the lungs every 6 (six) hours as needed for  wheezing or shortness of breath.   baclofen (LIORESAL) 10 MG tablet Take 1 tablet (10 mg total) by mouth 3 (three) times daily as needed for muscle spasms.   carvedilol (COREG) 25 MG tablet TAKE 1 TABLET (25 MG TOTAL) BY MOUTH TWICE A DAY WITH MEALS (Patient taking differently: Take 25 mg by mouth 2 (two) times daily with a meal.)   dicyclomine (BENTYL) 20 MG tablet Take 1 tablet (20 mg total) by mouth 3 (three) times daily as needed for spasms.   fluocinonide ointment (LIDEX) 6.16 % Apply 1 Application topically daily as needed (eczema).   Fluticasone-Salmeterol (ADVAIR DISKUS) 250-50 MCG/DOSE AEPB Inhale 1 puff into the lungs 2 (two) times daily.   furosemide (LASIX) 20 MG tablet TAKE 1 TABLET BY MOUTH EVERY DAY (Patient taking differently: Take 20 mg by mouth daily.)   hydrocortisone 2.5 % cream Apply 1 Application topically 2 (two) times daily.   nitroGLYCERIN (NITROSTAT) 0.4 MG SL tablet Place 1 tablet (0.4 mg total) under the tongue every 5 (five) minutes x 3 doses as needed for chest pain.   ondansetron (ZOFRAN-ODT) 4 MG disintegrating tablet Take 1 tablet (4 mg total) by mouth every 8 (eight) hours as needed for nausea or vomiting.   pantoprazole (PROTONIX) 40 MG tablet Take 40 mg by mouth daily.   sacubitril-valsartan (ENTRESTO) 24-26 MG Take 1 tablet by mouth 2 (two) times daily.   sertraline (ZOLOFT) 100 MG tablet Take 100 mg by mouth daily.   spironolactone (ALDACTONE) 25 MG tablet TAKE 1 TABLET (25 MG TOTAL) BY MOUTH DAILY.   topiramate (TOPAMAX) 25 MG tablet Take 1 tablet (25 mg total) by mouth at bedtime.   prochlorperazine (COMPAZINE) 10 MG tablet Take 1 tablet (10 mg total) by mouth every 8 (eight) hours as needed for nausea or vomiting. (Patient not taking: Reported on 08/21/2022)   No facility-administered encounter medications on file as of 08/21/2022.    REVIEW OF SYSTEMS  : All other systems reviewed and negative except where noted in the History of Present  Illness.   PHYSICAL EXAM: BP 116/82   Pulse 68   Ht '5\' 1"'$  (1.549 m)   Wt 165 lb 8 oz (75.1 kg)   BMI 31.27 kg/m  General: Well developed female in no acute distress Head: Normocephalic and atraumatic Eyes:  Sclerae anicteric, conjunctiva pink. Ears: Normal auditory acuity Lungs: Clear throughout to auscultation; no W/R/R. Heart: Regular rate and rhythm; no M/R/G. Abdomen: Soft, non-distended.  BS present.  Minimal diffuse TTP maybe more so on the right. Musculoskeletal: Symmetrical with no gross deformities  Skin: No lesions on visible extremities Extremities: No edema  Neurological: Alert oriented x 4, grossly non-focal Psychological:  Alert and cooperative. Normal mood and affect  ASSESSMENT AND PLAN: *51 year old female with complaints of right-sided abdominal pain, diarrhea, nausea and vomiting, and rectal bleeding that began suddenly last week.  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  Symptoms have improved, but still having some fatigue, right-sided abdominal pain, nausea.  Says that her stools are now dark in color.  She was heme-negative on exam today.  Hemoglobin and white blood cell count were normal in the ED.  Suspect maybe an infectious colitis, but also possibly the sclerosing mesenteritis.  I think she will get better with time.  I debated on a course of antibiotics, but decided to treat with a quick prednisone taper.  Will prescribe Medrol Dosepak.  Also recommended that she pick up the dicyclomine and try that for spasm and cramping.  I asked her to call or message back with an update sometime next week.  CC:  Caryl Bis, MD

## 2022-08-27 ENCOUNTER — Ambulatory Visit: Payer: BC Managed Care – PPO | Admitting: Physician Assistant

## 2022-09-01 ENCOUNTER — Other Ambulatory Visit (HOSPITAL_COMMUNITY): Payer: Self-pay | Admitting: Family Medicine

## 2022-09-01 DIAGNOSIS — Z1231 Encounter for screening mammogram for malignant neoplasm of breast: Secondary | ICD-10-CM

## 2022-09-08 ENCOUNTER — Encounter: Payer: Self-pay | Admitting: Nurse Practitioner

## 2022-09-08 ENCOUNTER — Ambulatory Visit: Payer: BC Managed Care – PPO | Attending: Cardiology | Admitting: Nurse Practitioner

## 2022-09-08 VITALS — BP 118/70 | HR 73 | Ht 61.0 in | Wt 164.5 lb

## 2022-09-08 DIAGNOSIS — I5022 Chronic systolic (congestive) heart failure: Secondary | ICD-10-CM

## 2022-09-08 DIAGNOSIS — I10 Essential (primary) hypertension: Secondary | ICD-10-CM

## 2022-09-08 DIAGNOSIS — Z79899 Other long term (current) drug therapy: Secondary | ICD-10-CM

## 2022-09-08 DIAGNOSIS — I428 Other cardiomyopathies: Secondary | ICD-10-CM | POA: Diagnosis not present

## 2022-09-08 DIAGNOSIS — J45909 Unspecified asthma, uncomplicated: Secondary | ICD-10-CM

## 2022-09-08 DIAGNOSIS — R0602 Shortness of breath: Secondary | ICD-10-CM

## 2022-09-08 DIAGNOSIS — Z95810 Presence of automatic (implantable) cardiac defibrillator: Secondary | ICD-10-CM

## 2022-09-08 NOTE — Patient Instructions (Signed)
Medication Instructions:  Your physician recommends that you continue on your current medications as directed. Please refer to the Current Medication list given to you today.  *If you need a refill on your cardiac medications before your next appointment, please call your pharmacy*   Lab Work: Your physician recommends that you return for lab work in: Today Paramedic)   If you have labs (blood work) drawn today and your tests are completely normal, you will receive your results only by: MyChart Message (if you have MyChart) OR A paper copy in the mail If you have any lab test that is abnormal or we need to change your treatment, we will call you to review the results.   Testing/Procedures: NONE    Follow-Up: At [facility], you and your health needs are our priority.  As part of our continuing mission to provide you with exceptional heart care, we have created designated Provider Care Teams.  These Care Teams include your primary Cardiologist (physician) and Advanced Practice Providers (APPs -  Physician Assistants and Nurse Practitioners) who all work together to provide you with the care you need, when you need it.  We recommend signing up for the patient portal called "MyChart".  Sign up information is provided on this After Visit Summary.  MyChart is used to connect with patients for Virtual Visits (Telemedicine).  Patients are able to view lab/test results, encounter notes, upcoming appointments, etc.  Non-urgent messages can be sent to your provider as well.   To learn more about what you can do with MyChart, go to [email or web address].    Your next appointment:   6 month(s)  The format for your next appointment:   In Person  Provider:   Carlyle Dolly, MD    Other Instructions Thank you for choosing [facility]!    Important Information About Sugar

## 2022-09-08 NOTE — Progress Notes (Signed)
Cardiology Office Note:    Date:  09/08/2022   ID:  Mary Pratt, DOB [birth year], MRN [id number]  PCP:  Caryl Bis, [facility] [facility] Cardiologist:  Carlyle Dolly, MD Electrophysiologist:  Virl Axe, MD     Referring MD: Caryl Bis, MD   CC: Here for follow-up  History of Present Illness:    Mary Pratt is a 52 y.o. adult with a hx of the following:  Chronic systolic CHF, status post ICD Nonischemic cardiomyopathy Hypertension Asthma History of chest pain GERD SS trait  Patient is a pleasant 52 year old female with past medical history as mentioned above.  Cardiac catheterization in 2016 revealed no CAD.  EF returned to normal after CRT-D was placed in 2017.  Low A-fib burden on device check previously.  Not on anticoagulation.  Last seen by Richardson Dopp, PA-C on May 06, 2022 for preoperative cardiovascular risk assessment via telephone visit for pending carpal tunnel release by Dr. Burney Gauze.  She was doing well at that time.  Denied any chest pain or shortness of breath, denied any leg edema. She had an episode of vasovagal syncope in July 2023 due to dehydration.  Denied any recurrent syncope.  According to guidelines, Richardson Dopp, PA-C stated she could proceed to surgery at acceptable risk.  Today she presents for follow-up. Has been noticing shortness of breath  for 1 week, does have dry cough.  Planning on going to get evaluated by primary care provider for this.  Denies any fevers, chills, nausea, vomiting, or sick contacts.  Denies any chest pain, palpitations, syncope, presyncope, dizziness, orthopnea, PND, swelling or significant weight changes, acute bleeding, or claudication.  Tolerating her medications well.  Weight is stable.  Denies any other questions or concerns today.   Past Medical History:  Diagnosis Date   Abnormal pap 9/13   ASCUS + HPV, 11/14 LGSIL   Anxiety    Asthma    Cardiac defibrillator in place     Chronic systolic CHF (congestive heart failure) ([place])    a. 02/2015 Echo: EF 20%, sev dil w/ diff HK, worse @ inf base. Tiv AI, mild MR, mod dil LA, PASP 81mHg.   Functional dyspepsia    early satiety    GERD with stricture    dilated 29292  HELICOBACTER PYLORI GASTRITIS 01/15/2009   dyspnea, ? reaction to Biaxin/amoxicillin  Pylera 01/15/09 - completed therapy      NICM ([place]ischemic cardiomyopathy) ([place]    a. 02/2015 Cath: nl cors, EF 15%, mild PAH;  b. 02/2015 Enrolled in VEST trial.   Sickle cell trait ([place]    Situational depression    "son died"    Past Surgical History:  Procedure Laterality Date   BIV ICD GENERATOR CHANGEOUT N/A 01/24/2021   Procedure: BIV ICD GENERATOR CHANGEOUT;  Surgeon: KDeboraha Sprang MD;  [facility];  Service: Cardiovascular;  Laterality: N/A;   CARDIAC CATHETERIZATION N/A 02/26/2015   Procedure: Right/Left Heart Cath and Coronary Angiography;  Surgeon: TTroy Sine MD;  [facility];  Service: Cardiovascular;  Laterality: N/A;   COLPOSCOPY  2013   neg   EP IMPLANTABLE DEVICE N/A 10/14/2015   Procedure: BiV ICD Insertion CRT-D;  Surgeon: SDeboraha Sprang MD;  [facility];  Service: Cardiovascular;  Laterality: N/A;   ESOPHAGOGASTRODUODENOSCOPY (EGD) WITH ESOPHAGEAL DILATION  11/30/2008   esophageal ring dilated 531French, H. pylori gastritis   TUBAL LIGATION  1991   WISDOM TOOTH EXTRACTION      Current Medications: Current Meds  Medication Sig   albuterol (PROVENTIL HFA;VENTOLIN HFA) 108 (90 BASE) MCG/ACT inhaler Inhale 2 puffs into the lungs every 6 (six) hours as needed for wheezing or shortness of breath.   carvedilol (COREG) 25 MG tablet TAKE 1 TABLET (25 MG TOTAL) BY MOUTH TWICE A DAY WITH MEALS (Patient taking differently: Take 25 mg by mouth 2 (two) times daily with a meal.)   Fluticasone-Salmeterol (ADVAIR DISKUS) 250-50 MCG/DOSE AEPB Inhale 1 puff into the lungs 2 (two) times daily.   furosemide  (LASIX) 20 MG tablet TAKE 1 TABLET BY MOUTH EVERY DAY (Patient taking differently: Take 20 mg by mouth daily.)   hydrocortisone 2.5 % cream Apply 1 Application topically 2 (two) times daily.   nitroGLYCERIN (NITROSTAT) 0.4 MG SL tablet Place 1 tablet (0.4 mg total) under the tongue every 5 (five) minutes x 3 doses as needed for chest pain.   pantoprazole (PROTONIX) 40 MG tablet Take 40 mg by mouth daily.   sacubitril-valsartan (ENTRESTO) 24-26 MG Take 1 tablet by mouth 2 (two) times daily.   sertraline (ZOLOFT) 100 MG tablet Take 100 mg by mouth daily.   spironolactone (ALDACTONE) 25 MG tablet TAKE 1 TABLET (25 MG TOTAL) BY MOUTH DAILY.   topiramate (TOPAMAX) 25 MG tablet Take 1 tablet (25 mg total) by mouth at bedtime.     Allergies:   Amoxicillin, Clarithromycin, Meloxicam, and Spiriva respimat [tiotropium bromide monohydrate]   Social History   Socioeconomic History   Marital status: Single    Spouse name: Not on file   Number of children: 3   Years of education: Not on file   Highest education level: Not on file  Occupational History   Occupation: texturing operator    Employer: [employer]  Tobacco Use   Smoking status: Never   Smokeless tobacco: Never  Vaping Use   Vaping Use: Never used  Substance and Sexual Activity   Alcohol use: Not Currently    Comment: occasional wine   Drug use: No   Sexual activity: Yes    Partners: Male    Birth control/protection: Surgical    Comment: BTL  Other Topics Concern   Not on file  Social History Narrative   Single, employed in Psychologist, 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   2 sons 1 daughter 1 son deceased   Drinks caffeine up to a few a day, never smoker no drug use   Social Determinants of Radio broadcast assistant Strain: Not on file  Food Insecurity: Not on file  Transportation Needs: Not on file  Physical Activity: Not on file  Stress: Not on file  Social Connections: Not on file     Family History: The patient's family  history includes Brain cancer in her maternal uncle; Breast cancer in her maternal aunt; Diabetes in her mother; Hypertension in her mother; Hypothyroidism in her mother; Lung cancer in her maternal grandmother; Prostate cancer in her paternal uncle. There is no history of Colon cancer, Colon polyps, or Esophageal cancer.  ROS:   Review of Systems  Constitutional: Negative.   HENT: Negative.    Eyes: Negative.   Respiratory:  Positive for cough and shortness of breath. Negative for hemoptysis, sputum production and wheezing.        See HPI.  Cardiovascular: Negative.   Gastrointestinal: Negative.   Genitourinary: Negative.   Musculoskeletal: Negative.   Skin: Negative.   Neurological: Negative.   Endo/Heme/Allergies:  Negative.   Psychiatric/Behavioral: Negative.      Please see the history of present illness.    All other systems reviewed and are negative.  EKGs/Labs/Other Studies Reviewed:    The following studies were reviewed today:   EKG:  EKG is not ordered today.     Myoview on 07/04/2021: Normal, low risk.   Echocardiogram on 06/25/2021:  1. Abnormal septal motion from pacing . Left ventricular ejection  fraction, by estimation, is 50 to 55%. The left ventricle has low normal  function. The left ventricle has no regional wall motion abnormalities.  There is mild left ventricular  hypertrophy. Left ventricular diastolic parameters were normal.   2. Pacing wires in RA/RV. Right ventricular systolic function is normal.  The right ventricular size is normal. There is normal pulmonary artery  systolic pressure.   3. The mitral valve is normal in structure. Trivial mitral valve  regurgitation. No evidence of mitral stenosis.   4. The aortic valve is normal in structure. Aortic valve regurgitation is  not visualized. No aortic stenosis is present.   5. The inferior vena cava is normal in size with greater than 50%  respiratory variability, suggesting right atrial  pressure of 3 mmHg.  Right and left heart cath on 02/26/2015: There is severe left ventricular systolic dysfunction. Very mild elevation of right sided heart pressures Central aortic oxygen saturation 98%; pulmonary artery oxygen saturation 65%.   Dilated severe nonischemic cardiomyopathy with an ejection fraction of 15%.   Normal coronary arteries.   Mild pulmonary hypertension.   RECOMMENDATION:   Aggressive medical therapy for her nonischemic cardiomyopathy with medication titration over the next several months.  Consider interim LifeVest, pending follow-up LV function assessment in several months.   Recent Labs: 08/15/2022: B Natriuretic Peptide 13.0 08/16/2022: ALT 19; BUN 11; Creatinine, Ser 1.08; Hemoglobin 12.4; Platelets 219; Potassium 3.2; Sodium 138  Recent Lipid Panel    Component Value Date/Time   CHOL 165 10/27/2017 1529   TRIG 302 (H) 10/27/2017 1529   HDL 43 10/27/2017 1529   CHOLHDL 3.8 10/27/2017 1529   VLDL 60 (H) 10/27/2017 1529   LDLCALC 62 10/27/2017 1529    Physical Exam:    VS:  BP 118/70   Pulse 73   Ht '5\' 1"'$  (1.549 m)   Wt 164 lb 8 oz (74.6 kg)   SpO2 93%   BMI 31.08 kg/m     Wt Readings from Last 3 Encounters:  09/08/22 164 lb 8 oz (74.6 kg)  08/21/22 165 lb 8 oz (75.1 kg)  08/16/22 158 lb 11.7 oz (72 kg)     GEN: Well nourished, well developed in no acute distress HEENT: Normal NECK: No JVD; No carotid bruits CARDIAC: S1/S2, RRR, no murmurs, rubs, gallops; 2+ peripheral pulses throughout, strong and equal bilaterally RESPIRATORY:  Clear to auscultation without rales, wheezing or rhonchi  MUSCULOSKELETAL:  No edema; No deformity  SKIN: Warm and dry NEUROLOGIC:  Alert and oriented x 3 PSYCHIATRIC:  Normal affect   ASSESSMENT:    1. Chronic systolic heart failure ([place])   2. ICD (implantable cardioverter-defibrillator) in place   3. NICM (nonischemic cardiomyopathy) ([place])   4. Medication management   5. Hypertension, unspecified  type   6. Uncomplicated asthma, unspecified asthma severity, unspecified whether persistent   7. Shortness of breath    PLAN:    In order of problems listed above:  Chronic systolic CHF, s/p ICD, NICM  EF normal in 2022. Euvolemic and well compensated on  exam. Continue carvedilol, Lasix, Entresto, and Aldactone.  Will obtain BMET. Low sodium diet, fluid restriction <2L, and daily weights encouraged. Educated to contact our office for weight gain of 2 lbs overnight or 5 lbs in one week.  Hypertension Blood pressure today stable.  BP well-controlled at home.  Continue current medication regimen. Heart healthy diet and regular cardiovascular exercise encouraged.   Asthma, shortness of breath Does admit to some shortness of breath within the past week.  She denies any fever, chills, N/B, or recent sick contacts.  She states she will be seeing her PCP regarding this and recent dry cough.  Euvolemic and well compensated on exam.  Continue current medication regimen.  Care precautions discussed and she verbalized understanding.  Disposition: Follow-up with Dr. Carlyle Dolly in 6 months or sooner if anything changes.    Medication Adjustments/Labs and Tests Ordered: Current medicines are reviewed at length with the patient today.  Concerns regarding medicines are outlined above.  Orders Placed This Encounter  Procedures   Basic Metabolic Panel (BMET)   No orders of the defined types were placed in this encounter.   Patient Instructions  Medication Instructions:  Your physician recommends that you continue on your current medications as directed. Please refer to the Current Medication list given to you today.  *If you need a refill on your cardiac medications before your next appointment, please call your pharmacy*   Lab Work: Your physician recommends that you return for lab work in: Today Paramedic)   If you have labs (blood work) drawn today and your tests are completely normal, you  will receive your results only by: MyChart Message (if you have MyChart) OR A paper copy in the mail If you have any lab test that is abnormal or we need to change your treatment, we will call you to review the results.   Testing/Procedures: NONE    Follow-Up: At [facility], you and your health needs are our priority.  As part of our continuing mission to provide you with exceptional heart care, we have created designated Provider Care Teams.  These Care Teams include your primary Cardiologist (physician) and Advanced Practice Providers (APPs -  Physician Assistants and Nurse Practitioners) who all work together to provide you with the care you need, when you need it.  We recommend signing up for the patient portal called "MyChart".  Sign up information is provided on this After Visit Summary.  MyChart is used to connect with patients for Virtual Visits (Telemedicine).  Patients are able to view lab/test results, encounter notes, upcoming appointments, etc.  Non-urgent messages can be sent to your provider as well.   To learn more about what you can do with MyChart, go to [email or web address].    Your next appointment:   6 month(s)  The format for your next appointment:   In Person  Provider:   Carlyle Dolly, MD    Other Instructions Thank you for choosing [facility]!    Important Information About Sugar         Signed, Finis Bud, NP  09/08/2022 2:58 PM    [place]

## 2022-09-09 ENCOUNTER — Other Ambulatory Visit: Payer: Self-pay | Admitting: Cardiology

## 2022-09-09 ENCOUNTER — Inpatient Hospital Stay (HOSPITAL_COMMUNITY): Admission: RE | Admit: 2022-09-09 | Payer: BC Managed Care – PPO | Source: Ambulatory Visit

## 2022-09-09 DIAGNOSIS — Z1231 Encounter for screening mammogram for malignant neoplasm of breast: Secondary | ICD-10-CM

## 2022-09-09 DIAGNOSIS — I5022 Chronic systolic (congestive) heart failure: Secondary | ICD-10-CM

## 2022-09-10 ENCOUNTER — Other Ambulatory Visit (INDEPENDENT_AMBULATORY_CARE_PROVIDER_SITE_OTHER): Payer: BC Managed Care – PPO

## 2022-09-10 ENCOUNTER — Other Ambulatory Visit: Payer: BC Managed Care – PPO

## 2022-09-10 ENCOUNTER — Other Ambulatory Visit: Payer: Self-pay | Admitting: Cardiology

## 2022-09-10 DIAGNOSIS — R197 Diarrhea, unspecified: Secondary | ICD-10-CM

## 2022-09-10 LAB — SEDIMENTATION RATE: Sed Rate: 46 mm/hr — ABNORMAL HIGH (ref 0–30)

## 2022-09-10 LAB — HIGH SENSITIVITY CRP: CRP, High Sensitivity: 2.22 mg/L (ref 0.000–5.000)

## 2022-09-10 NOTE — Addendum Note (Signed)
Addended by: Timothy Lasso on: 09/10/2022 11:56 AM   Modules accepted: Orders

## 2022-09-14 ENCOUNTER — Ambulatory Visit (HOSPITAL_COMMUNITY)
Admission: RE | Admit: 2022-09-14 | Discharge: 2022-09-14 | Disposition: A | Discharge status: Home / Self Care | Payer: BC Managed Care – PPO | Source: Ambulatory Visit | Attending: Family Medicine | Admitting: Family Medicine

## 2022-09-14 ENCOUNTER — Encounter (HOSPITAL_COMMUNITY): Payer: Self-pay

## 2022-09-14 DIAGNOSIS — Z1231 Encounter for screening mammogram for malignant neoplasm of breast: Secondary | ICD-10-CM

## 2022-09-14 LAB — CALPROTECTIN, FECAL: Calprotectin, Fecal: 27 ug/g (ref 0–120)

## 2022-09-14 NOTE — Telephone Encounter (Signed)
Mary Pratt have you seen results? The pt is asking for results.

## 2022-09-15 ENCOUNTER — Ambulatory Visit: Payer: BC Managed Care – PPO | Admitting: Gastroenterology

## 2022-09-16 ENCOUNTER — Emergency Department (HOSPITAL_COMMUNITY)
Admission: EM | Admit: 2022-09-16 | Discharge: 2022-09-16 | Disposition: A | Discharge status: Home / Self Care | Payer: BC Managed Care – PPO | Attending: Emergency Medicine | Admitting: Emergency Medicine

## 2022-09-16 ENCOUNTER — Encounter: Payer: Self-pay | Admitting: Cardiology

## 2022-09-16 ENCOUNTER — Other Ambulatory Visit: Payer: Self-pay

## 2022-09-16 ENCOUNTER — Encounter (HOSPITAL_COMMUNITY): Payer: Self-pay | Admitting: *Deleted

## 2022-09-16 ENCOUNTER — Emergency Department (HOSPITAL_COMMUNITY): Payer: BC Managed Care – PPO

## 2022-09-16 ENCOUNTER — Telehealth: Payer: Self-pay | Admitting: Gastroenterology

## 2022-09-16 DIAGNOSIS — R079 Chest pain, unspecified: Secondary | ICD-10-CM | POA: Diagnosis not present

## 2022-09-16 DIAGNOSIS — Z95810 Presence of automatic (implantable) cardiac defibrillator: Secondary | ICD-10-CM | POA: Insufficient documentation

## 2022-09-16 DIAGNOSIS — Z7951 Long term (current) use of inhaled steroids: Secondary | ICD-10-CM | POA: Diagnosis not present

## 2022-09-16 DIAGNOSIS — R109 Unspecified abdominal pain: Secondary | ICD-10-CM

## 2022-09-16 DIAGNOSIS — Z1152 Encounter for screening for COVID-19: Secondary | ICD-10-CM | POA: Diagnosis not present

## 2022-09-16 DIAGNOSIS — I5022 Chronic systolic (congestive) heart failure: Secondary | ICD-10-CM | POA: Diagnosis not present

## 2022-09-16 DIAGNOSIS — J45909 Unspecified asthma, uncomplicated: Secondary | ICD-10-CM | POA: Insufficient documentation

## 2022-09-16 DIAGNOSIS — R1084 Generalized abdominal pain: Secondary | ICD-10-CM | POA: Diagnosis not present

## 2022-09-16 LAB — CBC WITH DIFFERENTIAL/PLATELET
Abs Immature Granulocytes: 0.02 10*3/uL (ref 0.00–0.07)
Basophils Absolute: 0 10*3/uL (ref 0.0–0.1)
Basophils Relative: 0 %
Eosinophils Absolute: 0.2 10*3/uL (ref 0.0–0.5)
Eosinophils Relative: 3 %
HCT: 37.1 % (ref 36.0–46.0)
Hemoglobin: 12.3 g/dL (ref 12.0–15.0)
Immature Granulocytes: 0 %
Lymphocytes Relative: 35 %
Lymphs Abs: 2.4 10*3/uL (ref 0.7–4.0)
MCH: 28.2 pg (ref 26.0–34.0)
MCHC: 33.2 g/dL (ref 30.0–36.0)
MCV: 85.1 fL (ref 80.0–100.0)
Monocytes Absolute: 0.6 10*3/uL (ref 0.1–1.0)
Monocytes Relative: 9 %
Neutro Abs: 3.7 10*3/uL (ref 1.7–7.7)
Neutrophils Relative %: 53 %
Platelets: 219 10*3/uL (ref 150–400)
RBC: 4.36 MIL/uL (ref 3.87–5.11)
RDW: 14.7 % (ref 11.5–15.5)
WBC: 6.9 10*3/uL (ref 4.0–10.5)
nRBC: 0 % (ref 0.0–0.2)

## 2022-09-16 LAB — COMPREHENSIVE METABOLIC PANEL
ALT: 17 U/L (ref 0–44)
AST: 20 U/L (ref 15–41)
Albumin: 3.6 g/dL (ref 3.5–5.0)
Alkaline Phosphatase: 65 U/L (ref 38–126)
Anion gap: 8 (ref 5–15)
BUN: 9 mg/dL (ref 6–20)
CO2: 23 mmol/L (ref 22–32)
Calcium: 8.4 mg/dL — ABNORMAL LOW (ref 8.9–10.3)
Chloride: 104 mmol/L (ref 98–111)
Creatinine, Ser: 0.82 mg/dL (ref 0.44–1.00)
GFR, Estimated: >60 mL/min (ref >60)
Glucose, Bld: 114 mg/dL — ABNORMAL HIGH (ref 70–99)
Potassium: 3.5 mmol/L (ref 3.5–5.1)
Sodium: 135 mmol/L (ref 135–145)
Total Bilirubin: 0.5 mg/dL (ref 0.3–1.2)
Total Protein: 6.8 g/dL (ref 6.5–8.1)

## 2022-09-16 LAB — RESP PANEL BY RT-PCR (RSV, FLU A&B, COVID)  RVPGX2
Influenza A by PCR: NEGATIVE
Influenza B by PCR: NEGATIVE
Resp Syncytial Virus by PCR: NEGATIVE
SARS Coronavirus 2 by RT PCR: NEGATIVE

## 2022-09-16 LAB — LIPASE, BLOOD: Lipase: 35 U/L (ref 11–51)

## 2022-09-16 LAB — TROPONIN I (HIGH SENSITIVITY)
Troponin I (High Sensitivity): 3 ng/L (ref <18)
Troponin I (High Sensitivity): 3 ng/L (ref <18)

## 2022-09-16 LAB — BRAIN NATRIURETIC PEPTIDE: B Natriuretic Peptide: 17 pg/mL (ref 0.0–100.0)

## 2022-09-16 LAB — POC URINE PREG, ED: Preg Test, Ur: NEGATIVE

## 2022-09-16 NOTE — Telephone Encounter (Signed)
Inbound call from patient f/u on MyChart msg. Advised patient nurse is aware and will contact her at her earliest convenience tomorrow.

## 2022-09-16 NOTE — ED Triage Notes (Signed)
Pt with mid to left CP that started today, SOB for past 3 weeks.  Abd pain as well. LBM this am. +nausea, diarrhea at times.

## 2022-09-16 NOTE — ED Provider Notes (Signed)
[facility] EMERGENCY DEPARTMENT Provider Note   CSN: [id number] Arrival date & time: 09/16/22  1844     History  Chief Complaint  Patient presents with   Chest Pain    Mary Pratt is a 52 y.o. adult.   Chest Pain Patient resents with chest pain.  Has had for around 3 weeks.  Cough.  PCP reportedly thought it was her asthma and gave her steroids without relief.  Occasional cough without sputum production.  Also abdominal pain.  Some nausea and diarrhea.  Does have a history of chronic abdominal issues.  No fevers.  Pain is still in the abdomen.  Patient has an AICD for nonischemic cardiomyopathy.    Past Medical History:  Diagnosis Date   Abnormal pap 9/13   ASCUS + HPV, 11/14 LGSIL   Anxiety    Asthma    Cardiac defibrillator in place    Chronic systolic CHF (congestive heart failure) ([place])    a. 02/2015 Echo: EF 20%, sev dil w/ diff HK, worse @ inf base. Tiv AI, mild MR, mod dil LA, PASP 60mHg.   Functional dyspepsia    early satiety    GERD with stricture    dilated 21027  HELICOBACTER PYLORI GASTRITIS 01/15/2009   dyspnea, ? reaction to Biaxin/amoxicillin  Pylera 01/15/09 - completed therapy      [place]mic [place]) [place]    a. 02/2015 Cath: nl cors, EF 15%, mild PAH;  b. 02/2015 Enrolled in VEST trial.   Sickle cell trait ([place]    Situational depression    "son died"    Home Medications Prior to Admission medications   Medication Sig Start Date End Date Taking? Authorizing Provider  albuterol (PROVENTIL HFA;VENTOLIN HFA) 108 (90 BASE) MCG/ACT inhaler Inhale 2 puffs into the lungs every 6 (six) hours as needed for wheezing or shortness of breath.   Yes [provider]  carvedilol (COREG) 25 MG tablet TAKE 1 TABLET (25 MG TOTAL) BY MOUTH TWICE A DAY WITH MEALS Patient taking differently: Take 25 mg by mouth 2 (two) times daily with a meal. 09/09/22  Yes Branch, JAlphonse Guild MD  doxycycline (VIBRA-TABS) 100 MG tablet Take 100 mg by mouth 2 (two)  times daily. 09/08/22  Yes [provider]  ENTRESTO 24-26 MG TAKE 1 TABLET BY MOUTH TWICE A DAY 09/10/22  Yes Branch, JAlphonse Guild MD  fluocinonide ointment (LIDEX) 02.53% Apply 1 Application topically daily as needed (eczema).   Yes [provider]  Fluticasone-Salmeterol (ADVAIR DISKUS) 250-50 MCG/DOSE AEPB Inhale 1 puff into the lungs 2 (two) times daily. 03/23/13  Yes [name], KArmando Reichert MD  furosemide (LASIX) 20 MG tablet TAKE 1 TABLET BY MOUTH EVERY DAY Patient taking differently: Take 20 mg by mouth daily. 03/02/22  Yes Branch,Alphonse Guild MD  hydrocortisone 2.5 % cream Apply 1 Application topically 2 (two) times daily.   Yes [provider]  nitroGLYCERIN (NITROSTAT) 0.4 MG SL tablet Place 1 tablet (0.4 mg total) under the tongue every 5 (five) minutes x 3 doses as needed for chest pain. 06/25/21  Yes Bhagat, Bhavinkumar, PA  ondansetron (ZOFRAN-ODT) 4 MG disintegrating tablet Take 1 tablet (4 mg total) by mouth every 8 (eight) hours as needed for nausea or vomiting. 08/15/22  Yes LRex Kras PA  pantoprazole (PROTONIX) 40 MG tablet Take 40 mg by mouth daily.   Yes [provider]  prochlorperazine (COMPAZINE) 10 MG tablet Take 1 tablet (10 mg total) by mouth every 8 (eight)  hours as needed for nausea or vomiting. 08/16/22  Yes Davonna Belling, MD  sertraline (ZOLOFT) 100 MG tablet Take 100 mg by mouth daily. 01/24/20  Yes [provider]  spironolactone (ALDACTONE) 25 MG tablet TAKE 1 TABLET (25 MG TOTAL) BY MOUTH DAILY. 06/22/22  Yes BranchAlphonse Guild, MD  topiramate (TOPAMAX) 25 MG tablet Take 1 tablet (25 mg total) by mouth at bedtime. 08/05/22  Yes Jaffe, Adam R, DO  Azelastine HCl 137 MCG/SPRAY SOLN Place into both nostrils. Patient not taking: Reported on 09/16/2022 08/27/22   [provider]  baclofen (LIORESAL) 10 MG tablet Take 1 tablet (10 mg total) by mouth 3 (three) times daily as needed for muscle spasms. Patient not taking: Reported on  09/08/2022 08/05/22   Pieter Partridge, DO  dicyclomine (BENTYL) 20 MG tablet Take 1 tablet (20 mg total) by mouth 3 (three) times daily as needed for spasms. Patient not taking: Reported on 09/08/2022 08/16/22   Davonna Belling, MD      Allergies    Amoxicillin, Clarithromycin, Meloxicam, and Spiriva respimat [tiotropium bromide monohydrate]    Review of Systems   Review of Systems  Cardiovascular:  Positive for chest pain.    Physical Exam Updated Vital Signs BP (!) 108/59   Pulse 65   Temp 98 F (36.7 C) (Oral)   Resp 14   Ht '5\' 1"'$  (1.549 m)   Wt 72.1 kg   LMP 12/31/2021   SpO2 95%   BMI 30.04 kg/m  Physical Exam Vitals and nursing note reviewed.  Cardiovascular:     Rate and Rhythm: Regular rhythm.  Abdominal:     Tenderness: There is abdominal tenderness.     Comments: Mild diffuse abdominal tenderness without distention.  Musculoskeletal:     Right lower leg: No edema.     Left lower leg: No edema.  Neurological:     Mental Status: She is alert.     ED Results / Procedures / Treatments   Labs (all labs ordered are listed, but only abnormal results are displayed) Labs Reviewed  COMPREHENSIVE METABOLIC PANEL - Abnormal; Notable for the following components:      Result Value   Glucose, Bld 114 (*)    Calcium 8.4 (*)    All other components within normal limits  RESP PANEL BY RT-PCR (RSV, FLU A&B, COVID)  RVPGX2  LIPASE, BLOOD  BRAIN NATRIURETIC PEPTIDE  CBC WITH DIFFERENTIAL/PLATELET  POC URINE PREG, ED  TROPONIN I (HIGH SENSITIVITY)  TROPONIN I (HIGH SENSITIVITY)    EKG EKG Interpretation  Date/Time:  Wednesday September 16 2022 18:51:57 EST Ventricular Rate:  72 PR Interval:  168 QRS Duration: 112 QT Interval:  418 QTC Calculation: 457 R Axis:   111 Text Interpretation: Atrial-sensed ventricular-paced rhythm Abnormal ECG When compared with ECG of 25-Mar-2022 18:21, No significant change was found Confirmed by Davonna Belling [phone]) on 09/16/2022  7:03:51 PM  Radiology DG Abdomen Acute W/Chest  Result Date: 09/16/2022 CLINICAL DATA:  Shortness of breath and abdominal pain and mid to left chest pain EXAM: DG ABDOMEN ACUTE WITH 1 VIEW CHEST COMPARISON:  CT abdomen and pelvis 08/15/2022 and radiographs 06/24/2021 FINDINGS: Normal cardiomediastinal silhouette. Insert aortic left chest wall ICD. Bibasilar atelectasis. No pleural effusion or pneumothorax. No displaced rib fractures. Nonobstructive bowel-gas pattern. Moderate colonic stool load. No evidence of free intraperitoneal air. IMPRESSION: No acute radiographic abnormality in the chest or abdomen. Electronically Signed   By: Placido Sou M.D.   On: 09/16/2022 20:12  Procedures Procedures    Medications Ordered in ED Medications - No data to display  ED Course/ Medical Decision Making/ A&P                           Medical Decision Making Amount and/or Complexity of Data Reviewed Labs: ordered. Radiology: ordered.   Patient with chest pain shortness of breath cough some abdominal pain.  Is had for around 3 weeks.  Multiple medical problems.  Does have some chronic abdominal issues.  Will get chest x-ray and abdominal x-ray to evaluate for obstruction or infection.  Will get blood work and evaluate for CHF.  Will also get flu COVID and RSV testing.  Workup reassuring.  Blood work does not show anemia.  Normal BNP.  X-ray of acute abdominal series reassuring.  No obstruction or CHF.  Flu COVID and RSV negative.  Appears stable for discharge home.  Unclear cause but potentially some exacerbation of her chronic problems.  Does not appear to be CHF.  Discharge home.        Final Clinical Impression(s) / ED Diagnoses Final diagnoses:  Nonspecific chest pain  Abdominal pain, unspecified abdominal location    Rx / DC Orders ED Discharge Orders     None         Davonna Belling, MD 09/16/22 2337

## 2022-09-16 NOTE — Telephone Encounter (Signed)
Noted pt will be contacted as soon as Mary Pratt reviews messages when she is in the office tomorrow.

## 2022-09-17 NOTE — Telephone Encounter (Signed)
All testing including blood work, CXR, and EKG show no evidence of heart issues, no evidence of heart failure. Should f/u with pcp again, ER workup from heart standpoint was very reassuring  Zandra Abts MD

## 2022-09-28 ENCOUNTER — Telehealth: Payer: Self-pay | Admitting: Internal Medicine

## 2022-09-28 NOTE — Telephone Encounter (Signed)
Patient is following up on call.

## 2022-09-28 NOTE — Telephone Encounter (Signed)
Patient has an appointment with Dr. Carlean Purl 2/1; however, she is asking if she can be seen sooner.  She states now that everytime she goes to the bathroom and wipes, it's blood.  Please call patient and advise.  Thank you.

## 2022-09-29 NOTE — Telephone Encounter (Signed)
Pt stated that she is requesting a sooner office visit: Pt stating that she has abdominal bloating, poor appetite, some rectal bleeding noticed when when she wipes, foul smelling BM. Pt was scheduled for a sooner OV with Dr. Carlean Purl 09/30/2022 at 8:30 AM. Pt made aware:  Pt verbalized understanding with all questions answered.

## 2022-09-30 ENCOUNTER — Ambulatory Visit (INDEPENDENT_AMBULATORY_CARE_PROVIDER_SITE_OTHER): Payer: BC Managed Care – PPO | Admitting: Internal Medicine

## 2022-09-30 ENCOUNTER — Encounter: Payer: Self-pay | Admitting: Internal Medicine

## 2022-09-30 VITALS — BP 116/78 | HR 71 | Ht 61.0 in | Wt 163.0 lb

## 2022-09-30 DIAGNOSIS — K649 Unspecified hemorrhoids: Secondary | ICD-10-CM

## 2022-09-30 DIAGNOSIS — K625 Hemorrhage of anus and rectum: Secondary | ICD-10-CM | POA: Diagnosis not present

## 2022-09-30 DIAGNOSIS — K582 Mixed irritable bowel syndrome: Secondary | ICD-10-CM | POA: Diagnosis not present

## 2022-09-30 MED ORDER — DICYCLOMINE HCL 20 MG PO TABS: 20.0000 mg | ORAL_TABLET | Freq: Three times a day (TID) | ORAL | 1 refills | Status: DC | PRN

## 2022-09-30 MED ORDER — HYDROCORTISONE (PERIANAL) 2.5 % EX CREA: 1.0000 | TOPICAL_CREAM | Freq: Two times a day (BID) | CUTANEOUS | 1 refills | Status: DC | PRN

## 2022-09-30 NOTE — Progress Notes (Signed)
Mary Pratt 52 y.o. 12/03/1970 [id number]  Assessment & Plan:   Encounter Diagnoses  Name Primary?   Irritable bowel syndrome with both constipation and diarrhea Yes   Bleeding hemorrhoids      Lactulose hydrogen breath test to look for possible SIBO  Work on diet by reducing sugary beverages and trying to eliminate and trying not to eat ultra processed foods HCcream as needed hemorrhoids Dicyclomine as needed Follow-up 2 months Once SIBO test results are in we will determine additional treatment steps Subjective:   Chief Complaint: Bloating abdominal discomfort alternating bowel habits and some bright red blood per rectum  HPI 52 year old African-American woman with a history of functional dyspepsia and chronic recurrent abdominal pain who is here for follow-up.  Recent medical history as below:  In the ED at [facility] with 3 weeks of chest pain 09/16/2022  Last visit [facility] 08/21/2022, right abdominal pain nausea vomiting diarrhea and rectal bleeding.  Labs were normal she was treated with dicyclomine and Compazine after an ED visit at that time.  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 Though she complained of dark stools she was heme-negative in our office that day.  She was treated with a Medrol Dosepak and to use dicyclomine.  She is here for follow-up.    Screening colonoscopy 05/22/2021 was normal including terminal ileum inspection EGD same day irregular Z-line possible hiatal hernia overall negative/normal  She continues to have alternating bowel habits and bloating.  She feels fatigued and tired.  She says her heart failure is doing okay.  She does work third shift at [employer]. Some rectal bleeding with wiping at times.  Dicyclomine does help and provide some relief.   Diet history shows that she does drink juice and eat ultra processed foods at times. Wt Readings from Last 3 Encounters:  09/30/22 163 lb (73.9 kg)  09/16/22 159 lb (72.1 kg)  09/08/22 164 lb 8 oz (74.6 kg)    Allergies  Allergen Reactions   Amoxicillin Other (See Comments)    REACTION: Tacycardia   Clarithromycin Hives    Breaks out and gets whelps   Meloxicam Rash   Spiriva Respimat [Tiotropium Bromide Monohydrate] Rash   Current Meds  Medication Sig   albuterol (PROVENTIL HFA;VENTOLIN HFA) 108 (90 BASE) MCG/ACT inhaler Inhale 2 puffs into the lungs every 6 (six) hours as needed for wheezing or shortness of breath.   carvedilol (COREG) 25 MG tablet TAKE 1 TABLET (25 MG TOTAL) BY MOUTH TWICE A DAY WITH MEALS (Patient taking differently: Take 25 mg by mouth 2 (two) times daily with a meal.)   ENTRESTO 24-26 MG TAKE 1 TABLET BY MOUTH TWICE A DAY   fluocinonide ointment (LIDEX) 7.35 % Apply 1 Application topically daily as needed (eczema).   Fluticasone-Salmeterol (ADVAIR DISKUS) 250-50 MCG/DOSE AEPB Inhale 1 puff into the lungs 2 (two) times daily.   furosemide (LASIX) 20 MG tablet TAKE 1 TABLET BY MOUTH EVERY DAY (Patient taking differently: Take 20 mg by mouth daily.)   hydrocortisone 2.5 % cream Apply 1 Application topically 2 (two) times daily.   nitroGLYCERIN (NITROSTAT) 0.4 MG SL tablet Place 1 tablet (0.4 mg total) under the tongue every 5 (five) minutes x 3 doses as needed for chest pain.   pantoprazole (PROTONIX) 40 MG tablet Take 40 mg by mouth daily.  prochlorperazine (COMPAZINE) 10 MG tablet Take 1 tablet (10 mg total) by mouth every 8 (eight) hours as needed for nausea or vomiting.   sertraline (ZOLOFT) 100 MG tablet Take 100 mg by mouth daily.   spironolactone (ALDACTONE) 25 MG tablet TAKE 1 TABLET (25 MG TOTAL) BY MOUTH DAILY.   topiramate (TOPAMAX) 25 MG tablet Take 1 tablet (25 mg total) by mouth at bedtime.   traZODone (DESYREL) 50 MG tablet Take 100 mg by mouth at  bedtime as needed.   Past Medical History:  Diagnosis Date   Abnormal pap 9/13   ASCUS + HPV, 11/14 LGSIL   Anxiety    Asthma    Cardiac defibrillator in place    Chronic systolic CHF (congestive heart failure) ([place])    a. 02/2015 Echo: EF 20%, sev dil w/ diff HK, worse @ inf base. Tiv AI, mild MR, mod dil LA, PASP 41mHg.   Functional dyspepsia    early satiety    GERD with stricture    dilated 29379  HELICOBACTER PYLORI GASTRITIS 01/15/2009   dyspnea, ? reaction to Biaxin/amoxicillin  Pylera 01/15/09 - completed therapy      [place]    a. 02/2015 Cath: nl cors, EF 15%, mild PAH;  b. 02/2015 Enrolled in VEST trial.   [place]ickle cell trait [place]    Situational depression    "son died"   Past Surgical History:  Procedure Laterality Date   BIV ICD GENERATOR CHANGEOUT N/A 01/24/2021   Procedure: BIV ICD GENERATOR CHANGEOUT;  Surgeon: KDeboraha Sprang MD;  [facility];  Service: Cardiovascular;  Laterality: N/A;   CARDIAC CATHETERIZATION N/A 02/26/2015   Procedure: Right/Left Heart Cath and Coronary Angiography;  Surgeon: TTroy Sine MD;  [facility];  Service: Cardiovascular;  Laterality: N/A;   COLPOSCOPY  2013   neg   EP IMPLANTABLE DEVICE N/A 10/14/2015   Procedure: BiV ICD Insertion CRT-D;  Surgeon: SDeboraha Sprang MD;  [facility];  Service: Cardiovascular;  Laterality: N/A;   ESOPHAGOGASTRODUODENOSCOPY (EGD) WITH ESOPHAGEAL DILATION  11/30/2008   esophageal ring dilated 546French, H. pylori gastritis   TUBAL LIGATION  1991   WISDOM TOOTH EXTRACTION     Social History   Social History Narrative   Single, employed in [employer], educationalat the [employer]   2 sons 1 daughter 1 son deceased   Drinks caffeine up to a few a day, never smoker no drug use   family history includes Brain cancer in her maternal uncle; Breast cancer in her maternal aunt; Diabetes in her mother; Hypertension in her mother;  Hypothyroidism in her mother; Lung cancer in her maternal grandmother; Prostate cancer in her paternal uncle.   Review of Systems As above  Objective:   Physical Exam BP 116/78   Pulse 71   Ht '5\' 1"'$  (1.549 m)   Wt 163 lb (73.9 kg)   LMP 12/31/2021   SpO2 97%   BMI 30.80 kg/m  NAD Patti JMartinique CMA present.   Lungs cta ant Cor NL S1S2 no rmg Abd soft NT Rectal Nl anoderm no mass Flat affect Anoscopy G1 inflamed internal hemorrhoids

## 2022-09-30 NOTE — Patient Instructions (Addendum)
Please try to avoid packaged foods and stay away from sugar.  Use your dicyclomine as needed, a refill has been sent in.   You have been given a testing kit to check for small intestine bacterial overgrowth (SIBO) which is completed by a company named [facility]. Make sure to return your test in the mail using the return mailing label given to you along with the kit. Your demographic and insurance information have already been sent to the company and they should be in contact with you over the next 1-2 weeks regarding this test. [facility] will collect an upfront charge of $99.74 for commercial insurance plans and $209.74 is you are paying cash. Make sure to discuss with [facility] PRIOR to having the test to see if they have gotten information from your insurance company as to how much your testing will cost out of pocket, if any. Please keep in mind that you will be getting a call from phone number [phone] or a similar number. If you do not hear from them within this time frame, please call our office at [phone] or call [facility] directly at [phone].    We have sent the following medications to your pharmacy for you to pick up at your convenience: Hydrocortisone cream  I appreciate the opportunity to care for you. Silvano Rusk, MD, [facility]

## 2022-10-02 ENCOUNTER — Ambulatory Visit: Payer: BC Managed Care – PPO | Admitting: Obstetrics and Gynecology

## 2022-10-06 ENCOUNTER — Emergency Department (HOSPITAL_COMMUNITY)
Admission: EM | Admit: 2022-10-06 | Discharge: 2022-10-06 | Disposition: A | Discharge status: Home / Self Care | Payer: BC Managed Care – PPO | Attending: Emergency Medicine | Admitting: Emergency Medicine

## 2022-10-06 ENCOUNTER — Emergency Department (HOSPITAL_COMMUNITY): Payer: BC Managed Care – PPO

## 2022-10-06 ENCOUNTER — Other Ambulatory Visit: Payer: Self-pay

## 2022-10-06 ENCOUNTER — Encounter (HOSPITAL_COMMUNITY): Payer: Self-pay | Admitting: Emergency Medicine

## 2022-10-06 DIAGNOSIS — I509 Heart failure, unspecified: Secondary | ICD-10-CM | POA: Insufficient documentation

## 2022-10-06 DIAGNOSIS — R109 Unspecified abdominal pain: Secondary | ICD-10-CM | POA: Diagnosis not present

## 2022-10-06 LAB — COMPREHENSIVE METABOLIC PANEL
ALT: 20 U/L (ref 0–44)
AST: 22 U/L (ref 15–41)
Albumin: 4.1 g/dL (ref 3.5–5.0)
Alkaline Phosphatase: 69 U/L (ref 38–126)
Anion gap: 10 (ref 5–15)
BUN: 10 mg/dL (ref 6–20)
CO2: 24 mmol/L (ref 22–32)
Calcium: 8.9 mg/dL (ref 8.9–10.3)
Chloride: 100 mmol/L (ref 98–111)
Creatinine, Ser: 0.75 mg/dL (ref 0.44–1.00)
GFR, Estimated: >60 mL/min (ref >60)
Glucose, Bld: 87 mg/dL (ref 70–99)
Potassium: 3.4 mmol/L — ABNORMAL LOW (ref 3.5–5.1)
Sodium: 134 mmol/L — ABNORMAL LOW (ref 135–145)
Total Bilirubin: 0.6 mg/dL (ref 0.3–1.2)
Total Protein: 7.4 g/dL (ref 6.5–8.1)

## 2022-10-06 LAB — CBC
HCT: 39 % (ref 36.0–46.0)
Hemoglobin: 12.8 g/dL (ref 12.0–15.0)
MCH: 28.1 pg (ref 26.0–34.0)
MCHC: 32.8 g/dL (ref 30.0–36.0)
MCV: 85.5 fL (ref 80.0–100.0)
Platelets: 211 10*3/uL (ref 150–400)
RBC: 4.56 MIL/uL (ref 3.87–5.11)
RDW: 14.1 % (ref 11.5–15.5)
WBC: 4.5 10*3/uL (ref 4.0–10.5)
nRBC: 0 % (ref 0.0–0.2)

## 2022-10-06 LAB — URINALYSIS, ROUTINE W REFLEX MICROSCOPIC
Bilirubin Urine: NEGATIVE
Glucose, UA: NEGATIVE mg/dL
Hgb urine dipstick: NEGATIVE
Ketones, ur: NEGATIVE mg/dL
Leukocytes,Ua: NEGATIVE
Nitrite: NEGATIVE
Protein, ur: NEGATIVE mg/dL
Specific Gravity, Urine: 1.003 — ABNORMAL LOW (ref 1.005–1.030)
pH: 6 (ref 5.0–8.0)

## 2022-10-06 LAB — PREGNANCY, URINE: Preg Test, Ur: NEGATIVE

## 2022-10-06 LAB — LIPASE, BLOOD: Lipase: 30 U/L (ref 11–51)

## 2022-10-06 MED ORDER — KETOROLAC TROMETHAMINE 15 MG/ML IJ SOLN
15.0000 mg | Freq: Once | INTRAMUSCULAR | Status: AC
  Administered 2022-10-06: 15 mg via INTRAVENOUS
  Filled 2022-10-06: qty 1

## 2022-10-06 MED ORDER — IOHEXOL 300 MG/ML  SOLN
100.0000 mL | Freq: Once | INTRAMUSCULAR | Status: AC | PRN
  Administered 2022-10-06: 100 mL via INTRAVENOUS

## 2022-10-06 MED ORDER — DICYCLOMINE HCL 10 MG PO CAPS
10.0000 mg | ORAL_CAPSULE | Freq: Once | ORAL | Status: AC
  Administered 2022-10-06: 10 mg via ORAL
  Filled 2022-10-06: qty 1

## 2022-10-06 NOTE — ED Triage Notes (Signed)
Pt c/o of right sided flank pain that radiates around her abdomen. Pt c/o nausea/ no emesis. States it started suddenly yesterday.

## 2022-10-06 NOTE — ED Provider Triage Note (Signed)
Emergency Medicine Provider Triage Evaluation Note  Mary Pratt , a 52 y.o. adult  was evaluated in triage.  Pt complains of 6 day history of midline low back pain which radiates into her right side.  Denies injury, no history of kidney stones, denies dysuria, fevers, no nausea or vomiting.  Triggered by certain movements..  Review of Systems  Positive: Low back pain Negative: Fever, nausea, dysuria  Physical Exam  BP (!) 137/96 (BP Location: Right Arm)   Pulse 70   Temp 97.9 F (36.6 C) (Oral)   Resp 18   Ht '5\' 1"'$  (1.549 m)   Wt 74 kg   LMP 12/31/2021   SpO2 95%   BMI 30.83 kg/m  Gen:   Awake, no distress   Resp:  Normal effort  MSK:   Moves extremities without difficulty  Other:    Medical Decision Making  Medically screening exam initiated at 4:02 PM.  Appropriate orders placed.  Mary Pratt was informed that the remainder of the evaluation will be completed by another provider, this initial triage assessment does not replace that evaluation, and the importance of remaining in the ED until their evaluation is complete.     Mary Jefferson, PA-C 10/06/22 4332

## 2022-10-06 NOTE — ED Provider Notes (Signed)
[facility] Provider Note   CSN: [id number] Arrival date & time: 10/06/22  1530     History  Chief Complaint  Patient presents with   Abdominal Pain   Flank Pain    Mary Pratt is a 52 y.o. adult with a past medical history of IBS, sclerosing mesenteritis and CHF presenting today due to abdominal pain.  She says it has been present for 6 days.  Usually it is dull but she has moments where it is sharp.  No surgical history of the abdomen.  Endorses some nausea but no vomiting, constipation or diarrhea.  Says that it is also present on the right side, no urinary or vaginal symptoms.   Abdominal Pain Associated symptoms: nausea   Associated symptoms: no dysuria, no hematuria and no vomiting   Flank Pain Associated symptoms include abdominal pain.       Home Medications Prior to Admission medications   Medication Sig Start Date End Date Taking? Authorizing Provider  albuterol (PROVENTIL HFA;VENTOLIN HFA) 108 (90 BASE) MCG/ACT inhaler Inhale 2 puffs into the lungs every 6 (six) hours as needed for wheezing or shortness of breath.    [provider]  Azelastine HCl 137 MCG/SPRAY SOLN Place into both nostrils. Patient not taking: Reported on 09/16/2022 08/27/22   [provider]  baclofen (LIORESAL) 10 MG tablet Take 1 tablet (10 mg total) by mouth 3 (three) times daily as needed for muscle spasms. Patient not taking: Reported on 09/08/2022 08/05/22   Pieter Partridge, DO  carvedilol (COREG) 25 MG tablet TAKE 1 TABLET (25 MG TOTAL) BY MOUTH TWICE A DAY WITH MEALS Patient taking differently: Take 25 mg by mouth 2 (two) times daily with a meal. 09/09/22   Branch, Alphonse Guild, MD  dicyclomine (BENTYL) 20 MG tablet Take 1 tablet (20 mg total) by mouth 3 (three) times daily as needed for spasms. 09/30/22   Gatha Mayer, MD  ENTRESTO 24-26 MG TAKE 1 TABLET BY MOUTH TWICE A DAY 09/10/22   Arnoldo Lenis, MD  fluocinonide ointment  (LIDEX) 7.79 % Apply 1 Application topically daily as needed (eczema).    [provider]  Fluticasone-Salmeterol (ADVAIR DISKUS) 250-50 MCG/DOSE AEPB Inhale 1 puff into the lungs 2 (two) times daily. 03/23/13   [name], Armando Reichert, MD  furosemide (LASIX) 20 MG tablet TAKE 1 TABLET BY MOUTH EVERY DAY Patient taking differently: Take 20 mg by mouth daily. 03/02/22   Arnoldo Lenis, MD  hydrocortisone (ANUSOL-HC) 2.5 % rectal cream Place 1 Application rectally 2 (two) times daily as needed for hemorrhoids or anal itching. 09/30/22   Gatha Mayer, MD  nitroGLYCERIN (NITROSTAT) 0.4 MG SL tablet Place 1 tablet (0.4 mg total) under the tongue every 5 (five) minutes x 3 doses as needed for chest pain. 06/25/21   Bhagat, Bhavinkumar, PA  ondansetron (ZOFRAN-ODT) 4 MG disintegrating tablet Take 1 tablet (4 mg total) by mouth every 8 (eight) hours as needed for nausea or vomiting. Patient not taking: Reported on 09/30/2022 08/15/22   Rex Kras, PA  pantoprazole (PROTONIX) 40 MG tablet Take 40 mg by mouth daily.    [provider]  prochlorperazine (COMPAZINE) 10 MG tablet Take 1 tablet (10 mg total) by mouth every 8 (eight) hours as needed for nausea or vomiting. 08/16/22   Davonna Belling, MD  sertraline (ZOLOFT) 100 MG tablet Take 100 mg by mouth daily. 01/24/20   [provider]  spironolactone (ALDACTONE) 25  MG tablet TAKE 1 TABLET (25 MG TOTAL) BY MOUTH DAILY. 06/22/22   Arnoldo Lenis, MD  topiramate (TOPAMAX) 25 MG tablet Take 1 tablet (25 mg total) by mouth at bedtime. 08/05/22   Pieter Partridge, DO  traZODone (DESYREL) 50 MG tablet Take 100 mg by mouth at bedtime as needed. 09/24/22   [provider]      Allergies    Amoxicillin, Clarithromycin, Meloxicam, and Spiriva respimat [tiotropium bromide monohydrate]    Review of Systems   Review of Systems  Gastrointestinal:  Positive for abdominal pain and nausea. Negative for vomiting.  Genitourinary:  Positive for  flank pain. Negative for dysuria and hematuria.  Musculoskeletal:  Positive for back pain.    Physical Exam Updated Vital Signs BP 128/87 (BP Location: Left Arm)   Pulse 66   Temp 98.2 F (36.8 C) (Oral)   Resp 16   Ht '5\' 1"'$  (1.549 m)   Wt 74 kg   LMP 12/31/2021   SpO2 97%   BMI 30.83 kg/m  Physical Exam Vitals and nursing note reviewed.  Constitutional:      Appearance: Normal appearance.  HENT:     Head: Normocephalic and atraumatic.  Eyes:     General: No scleral icterus.    Conjunctiva/sclera: Conjunctivae normal.  Pulmonary:     Effort: Pulmonary effort is normal. No respiratory distress.  Abdominal:     General: Abdomen is flat.     Palpations: Abdomen is soft.     Tenderness: There is no abdominal tenderness. There is no right CVA tenderness or left CVA tenderness.  Skin:    Findings: No rash.  Neurological:     Mental Status: She is alert.  Psychiatric:        Mood and Affect: Mood normal.     ED Results / Procedures / Treatments   Labs (all labs ordered are listed, but only abnormal results are displayed) Labs Reviewed  URINALYSIS, ROUTINE W REFLEX MICROSCOPIC - Abnormal; Notable for the following components:      Result Value   Color, Urine STRAW (*)    Specific Gravity, Urine 1.003 (*)    All other components within normal limits  COMPREHENSIVE METABOLIC PANEL - Abnormal; Notable for the following components:   Sodium 134 (*)    Potassium 3.4 (*)    All other components within normal limits  LIPASE, BLOOD  CBC  PREGNANCY, URINE    EKG None  Radiology CT ABDOMEN PELVIS W CONTRAST  Result Date: 10/06/2022 CLINICAL DATA:  Acute abdominal pain EXAM: CT ABDOMEN AND PELVIS WITH CONTRAST TECHNIQUE: Multidetector CT imaging of the abdomen and pelvis was performed using the standard protocol following bolus administration of intravenous contrast. RADIATION DOSE REDUCTION: This exam was performed according to the departmental dose-optimization program  which includes automated exposure control, adjustment of the mA and/or kV according to patient size and/or use of iterative reconstruction technique. CONTRAST:  172m OMNIPAQUE IOHEXOL 300 MG/ML  SOLN COMPARISON:  CT abdomen and pelvis 08/15/2022 FINDINGS: Lower chest: No acute abnormality. Hepatobiliary: Rounded hypodensities in the left lobe of the liver measure up to 11 mm and are favored as cysts. These are unchanged. Gallbladder and bile ducts are within normal limits. Pancreas: Unremarkable. No pancreatic ductal dilatation or surrounding inflammatory changes. Spleen: Normal in size without focal abnormality. Adrenals/Urinary Tract: There is a 1 cm cyst in the superior pole of the right kidney. Otherwise, the kidneys, adrenal glands and bladder are within normal limits. Stomach/Bowel: Stomach is  within normal limits. Appendix appears normal. No evidence of bowel wall thickening, distention, or inflammatory changes. Vascular/Lymphatic: No significant vascular findings are present. No enlarged abdominal or pelvic lymph nodes. Reproductive: Uterus and bilateral adnexa are unremarkable. Other: Small fat containing umbilical hernia is present. There is no ascites or free air. Musculoskeletal: No fracture is seen. IMPRESSION: 1. No acute process in the abdomen or pelvis. 2. Small fat containing umbilical hernia. 3. Rounded hypodensities in the left lobe of the liver measure up to 11 mm and are favored as cysts. No follow-up necessary. Electronically Signed   By: Ronney Asters M.D.   On: 10/06/2022 19:00   DG Lumbar Spine Complete  Result Date: 10/06/2022 CLINICAL DATA:  Lower back pain without known injury. EXAM: LUMBAR SPINE - COMPLETE 4+ VIEW COMPARISON:  None Available. FINDINGS: No fracture or spondylolisthesis is noted. Moderate degenerative disc disease is noted at L4-5. Remaining disc spaces are unremarkable. IMPRESSION: Moderate degenerative disc disease is noted at L4-5. No acute abnormality seen.  Electronically Signed   By: Marijo Conception M.D.   On: 10/06/2022 16:33    Procedures Procedures   Medications Ordered in ED Medications  dicyclomine (BENTYL) capsule 10 mg (10 mg Oral Given 10/06/22 1833)  ketorolac (TORADOL) 15 MG/ML injection 15 mg (15 mg Intravenous Given 10/06/22 1834)  iohexol (OMNIPAQUE) 300 MG/ML solution 100 mL (100 mLs Intravenous Contrast Given 10/06/22 1854)    ED Course/ Medical Decision Making/ A&P                             Medical Decision Making Amount and/or Complexity of Data Reviewed Labs: ordered. Radiology: ordered.  Risk Prescription drug management.   52 year old female presenting with abdominal pain. The differential diagnosis for generalized abdominal pain includes, but is not limited to AAA, gastroenteritis, appendicitis, Bowel obstruction, Bowel perforation. Gastroparesis, DKA, Hernia, Inflammatory bowel disease, mesenteric ischemia, pancreatitis, peritonitis SBP, volvulus,    This is not an exhaustive differential.    Past Medical History / Co-morbidities / Social History: IBS, sclerosing mesenteritis   Additional history: Per chart review patient follows closely with [facility].  She last saw them 6 days ago for IBS.   Physical Exam: Pertinent physical exam findings include Pretty benign abdomen.  Lab Tests: I ordered, and personally interpreted labs.  There are no acute findings.  Imaging Studies: I ordered and independently visualized and interpreted CT abdomen pelvis and I agree with the radiologist that there are no acute findings   MDM/Disposition: This is a 51 year old female who presented with abdominal pain.  She has various visits for this in the past.  Lab work was completely benign and physical exam was not convincing.  Hypertensive and repeated in shared decision-making at the bedside and patient said that she would prefer to have a CT scan.  This was ordered and there were no acute findings.  She did appear  constipated so she was notified of this and can treat it with over-the-counter remedies.  Ultimately she will need to follow-up with her GI provider.  I do have some suspicion that this is musculoskeletal pain however she does not have an emergent condition requiring any further intervention.  Did consider STD/PID/TOA however patient denies any pelvic symptoms and is not concerned for any STDs.  Additionally, her white count is fine and we can see a lot of her pelvic organs on CT scan and these are benign.  Will be discharged  and she is agreeable to this.     Final Clinical Impression(s) / ED Diagnoses Final diagnoses:  Right flank pain    Rx / DC Orders ED Discharge Orders     None      Results and diagnoses were explained to the patient. Return precautions discussed in full. Patient had no additional questions and expressed complete understanding.   This chart was dictated using voice recognition software.  Despite best efforts to proofread,  errors can occur which can change the documentation meaning.    Darliss Ridgel 10/06/22 Willow Ora, MD 10/07/22 1058

## 2022-10-06 NOTE — Discharge Instructions (Signed)
You came to the emergency department due to pain in your right side.  Your CAT scan did not show anything acute.  Please follow-up with your gastroenterologist about this.  As we discussed you are constipated and this may also be contributing to your pain.  You may use MiraLAX or over-the-counter enemas for this as needed.  Do not hesitate to return with any worsening symptoms.  It was a pleasure to meet you and we hope you feel better!

## 2022-10-07 ENCOUNTER — Encounter: Payer: Self-pay | Admitting: Internal Medicine

## 2022-10-08 ENCOUNTER — Ambulatory Visit: Payer: BC Managed Care – PPO | Admitting: Internal Medicine

## 2022-10-08 ENCOUNTER — Telehealth: Payer: Self-pay

## 2022-10-08 ENCOUNTER — Ambulatory Visit: Payer: BC Managed Care – PPO | Admitting: Obstetrics and Gynecology

## 2022-10-08 ENCOUNTER — Telehealth: Payer: Self-pay | Admitting: Internal Medicine

## 2022-10-08 NOTE — Telephone Encounter (Signed)
Overdue for follow up for device check.  LV threshold needs to be evaluated.  Sending to scheduler.

## 2022-10-08 NOTE — Telephone Encounter (Signed)
Patient is calling to see if Dr Carlean Purl has taken a look at her CT scan yet.

## 2022-10-09 NOTE — Telephone Encounter (Signed)
Left message for pt to call back  °

## 2022-10-09 NOTE — Telephone Encounter (Signed)
Pt stated that Dr. Carlean Purl communicated with her yesterday: Pt has no further questions Pt verbalized understanding with all questions answered.

## 2022-10-10 ENCOUNTER — Other Ambulatory Visit: Payer: Self-pay | Admitting: Cardiology

## 2022-10-10 DIAGNOSIS — I5022 Chronic systolic (congestive) heart failure: Secondary | ICD-10-CM

## 2022-10-23 ENCOUNTER — Ambulatory Visit (INDEPENDENT_AMBULATORY_CARE_PROVIDER_SITE_OTHER): Payer: BC Managed Care – PPO

## 2022-10-23 DIAGNOSIS — I428 Other cardiomyopathies: Secondary | ICD-10-CM | POA: Diagnosis not present

## 2022-10-23 LAB — CUP PACEART REMOTE DEVICE CHECK
Battery Remaining Longevity: 41 mo
Battery Voltage: 2.97 V
Brady Statistic AP VP Percent: 0.34 %
Brady Statistic AP VS Percent: 0.02 %
Brady Statistic AS VP Percent: 99.61 %
Brady Statistic AS VS Percent: 0.04 %
Brady Statistic RA Percent Paced: 0.35 %
Brady Statistic RV Percent Paced: 99.6 %
Date Time Interrogation Session: 20240216001704
HighPow Impedance: 73 Ohm
Implantable Lead Connection Status: 753985
Implantable Lead Connection Status: 753985
Implantable Lead Connection Status: 753985
Implantable Lead Implant Date: 20170206
Implantable Lead Implant Date: 20170206
Implantable Lead Implant Date: 20170206
Implantable Lead Location: 753858
Implantable Lead Location: 753859
Implantable Lead Location: 753860
Implantable Lead Model: 4398
Implantable Lead Model: 5076
Implantable Pulse Generator Implant Date: 20220520
Lead Channel Impedance Value: 1007 Ohm
Lead Channel Impedance Value: 1178 Ohm
Lead Channel Impedance Value: 1197 Ohm
Lead Channel Impedance Value: 1235 Ohm
Lead Channel Impedance Value: 1425 Ohm
Lead Channel Impedance Value: 1463 Ohm
Lead Channel Impedance Value: 306.269
Lead Channel Impedance Value: 306.269
Lead Channel Impedance Value: 315.129
Lead Channel Impedance Value: 393.735
Lead Channel Impedance Value: 393.735
Lead Channel Impedance Value: 456 Ohm
Lead Channel Impedance Value: 513 Ohm
Lead Channel Impedance Value: 532 Ohm
Lead Channel Impedance Value: 608 Ohm
Lead Channel Impedance Value: 760 Ohm
Lead Channel Impedance Value: 760 Ohm
Lead Channel Impedance Value: 817 Ohm
Lead Channel Pacing Threshold Amplitude: 0.5 V
Lead Channel Pacing Threshold Amplitude: 0.5 V
Lead Channel Pacing Threshold Amplitude: 3.75 V
Lead Channel Pacing Threshold Pulse Width: 0.4 ms
Lead Channel Pacing Threshold Pulse Width: 0.4 ms
Lead Channel Pacing Threshold Pulse Width: 1 ms
Lead Channel Sensing Intrinsic Amplitude: 1.625 mV
Lead Channel Sensing Intrinsic Amplitude: 1.625 mV
Lead Channel Sensing Intrinsic Amplitude: 8.75 mV
Lead Channel Sensing Intrinsic Amplitude: 8.75 mV
Lead Channel Setting Pacing Amplitude: 1.5 V
Lead Channel Setting Pacing Amplitude: 2 V
Lead Channel Setting Pacing Amplitude: 3.75 V
Lead Channel Setting Pacing Pulse Width: 0.4 ms
Lead Channel Setting Pacing Pulse Width: 1 ms
Lead Channel Setting Sensing Sensitivity: 0.3 mV
Zone Setting Status: 755011

## 2022-11-04 ENCOUNTER — Ambulatory Visit: Payer: BC Managed Care – PPO | Admitting: Neurology

## 2022-11-05 ENCOUNTER — Encounter: Payer: Self-pay | Admitting: Neurology

## 2022-11-05 ENCOUNTER — Encounter: Payer: Self-pay | Admitting: Internal Medicine

## 2022-11-05 ENCOUNTER — Other Ambulatory Visit: Payer: Self-pay

## 2022-11-06 ENCOUNTER — Other Ambulatory Visit: Payer: Self-pay

## 2022-11-06 ENCOUNTER — Emergency Department (HOSPITAL_COMMUNITY)
Admission: EM | Admit: 2022-11-06 | Discharge: 2022-11-06 | Disposition: A | Discharge status: Home / Self Care | Payer: BC Managed Care – PPO | Attending: Emergency Medicine | Admitting: Emergency Medicine

## 2022-11-06 ENCOUNTER — Emergency Department (HOSPITAL_COMMUNITY)
Admission: EM | Admit: 2022-11-06 | Discharge: 2022-11-06 | Discharge status: Against Medical Advice | Payer: BC Managed Care – PPO | Source: Home / Self Care

## 2022-11-06 ENCOUNTER — Encounter (HOSPITAL_COMMUNITY): Payer: Self-pay | Admitting: *Deleted

## 2022-11-06 DIAGNOSIS — Z79899 Other long term (current) drug therapy: Secondary | ICD-10-CM | POA: Insufficient documentation

## 2022-11-06 DIAGNOSIS — M79662 Pain in left lower leg: Secondary | ICD-10-CM | POA: Insufficient documentation

## 2022-11-06 DIAGNOSIS — R110 Nausea: Secondary | ICD-10-CM | POA: Insufficient documentation

## 2022-11-06 DIAGNOSIS — Z5321 Procedure and treatment not carried out due to patient leaving prior to being seen by health care provider: Secondary | ICD-10-CM | POA: Insufficient documentation

## 2022-11-06 DIAGNOSIS — R200 Anesthesia of skin: Secondary | ICD-10-CM | POA: Insufficient documentation

## 2022-11-06 DIAGNOSIS — I110 Hypertensive heart disease with heart failure: Secondary | ICD-10-CM | POA: Diagnosis not present

## 2022-11-06 DIAGNOSIS — E671 Hypercarotinemia: Secondary | ICD-10-CM

## 2022-11-06 DIAGNOSIS — I509 Heart failure, unspecified: Secondary | ICD-10-CM | POA: Diagnosis not present

## 2022-11-06 DIAGNOSIS — E538 Deficiency of other specified B group vitamins: Secondary | ICD-10-CM

## 2022-11-06 DIAGNOSIS — L819 Disorder of pigmentation, unspecified: Secondary | ICD-10-CM | POA: Diagnosis not present

## 2022-11-06 HISTORY — DX: Deficiency of other specified B group vitamins: E53.8

## 2022-11-06 NOTE — ED Provider Notes (Signed)
[place] EMERGENCY DEPARTMENT AT [facility] Provider Note   CSN: [id number] Arrival date & time: 11/06/22  1120     History  Chief Complaint  Patient presents with   Hand Problem    Mary Pratt is a 52 y.o. adult.  Patient with history of CHF, sclerosing mesenteritis, hypertension, and GERD presents today with complaints of orange palms. She states that she first noticed same when she was at work this morning. Her palms do not hurt or itch. She does state that she has some pain and numbness in her left hand, however upon chart review of recent visits with her surgeon on 2/13 it appears that she was having those symptoms at that as well and they still cleared her to return to normal activity. She states that the symptoms have not caused her to be unable to work and she is still able to move her hands without difficulty. Patient denies any recent dietary changes or eating foods high in carotene. She also denies fevers, chills, abdominal pain, nausea, vomiting, arthralgias, or myalgias.  The history is provided by the patient. No language interpreter was used.       Home Medications Prior to Admission medications   Medication Sig Start Date End Date Taking? Authorizing Provider  albuterol (PROVENTIL HFA;VENTOLIN HFA) 108 (90 BASE) MCG/ACT inhaler Inhale 2 puffs into the lungs every 6 (six) hours as needed for wheezing or shortness of breath.    [provider]  Azelastine HCl 137 MCG/SPRAY SOLN Place into both nostrils. Patient not taking: Reported on 09/16/2022 08/27/22   [provider]  baclofen (LIORESAL) 10 MG tablet Take 1 tablet (10 mg total) by mouth 3 (three) times daily as needed for muscle spasms. Patient not taking: Reported on 09/08/2022 08/05/22   Pieter Partridge, DO  carvedilol (COREG) 25 MG tablet TAKE 1 TABLET (25 MG TOTAL) BY MOUTH TWICE A DAY WITH MEALS Patient taking differently: Take 25 mg by mouth 2 (two) times daily with a meal. 09/09/22    Branch, Alphonse Guild, MD  dicyclomine (BENTYL) 20 MG tablet Take 1 tablet (20 mg total) by mouth 3 (three) times daily as needed for spasms. 09/30/22   Gatha Mayer, MD  ENTRESTO 24-26 MG TAKE 1 TABLET BY MOUTH TWICE A DAY 09/10/22   Arnoldo Lenis, MD  fluocinonide ointment (LIDEX) AB-123456789 % Apply 1 Application topically daily as needed (eczema).    [provider]  Fluticasone-Salmeterol (ADVAIR DISKUS) 250-50 MCG/DOSE AEPB Inhale 1 puff into the lungs 2 (two) times daily. 03/23/13   Kathee Delton, MD  furosemide (LASIX) 20 MG tablet TAKE 1 TABLET BY MOUTH EVERY DAY 10/12/22   Arnoldo Lenis, MD  hydrocortisone (ANUSOL-HC) 2.5 % rectal cream Place 1 Application rectally 2 (two) times daily as needed for hemorrhoids or anal itching. 09/30/22   Gatha Mayer, MD  nitroGLYCERIN (NITROSTAT) 0.4 MG SL tablet Place 1 tablet (0.4 mg total) under the tongue every 5 (five) minutes x 3 doses as needed for chest pain. 06/25/21   Bhagat, Bhavinkumar, PA  ondansetron (ZOFRAN-ODT) 4 MG disintegrating tablet Take 1 tablet (4 mg total) by mouth every 8 (eight) hours as needed for nausea or vomiting. Patient not taking: Reported on 09/30/2022 08/15/22   Rex Kras, PA  pantoprazole (PROTONIX) 40 MG tablet Take 40 mg by mouth daily.    [provider]  prochlorperazine (COMPAZINE) 10 MG tablet Take 1 tablet (10 mg total) by mouth every 8 (  eight) hours as needed for nausea or vomiting. 08/16/22   Davonna Belling, MD  sertraline (ZOLOFT) 100 MG tablet Take 100 mg by mouth daily. 01/24/20   [provider]  spironolactone (ALDACTONE) 25 MG tablet TAKE 1 TABLET (25 MG TOTAL) BY MOUTH DAILY. 06/22/22   Arnoldo Lenis, MD  topiramate (TOPAMAX) 25 MG tablet Take 1 tablet (25 mg total) by mouth at bedtime. 08/05/22   Pieter Partridge, DO  traZODone (DESYREL) 50 MG tablet Take 100 mg by mouth at bedtime as needed. 09/24/22   [provider]      Allergies    Amoxicillin,  Clarithromycin, Meloxicam, and Spiriva respimat [tiotropium bromide monohydrate]    Review of Systems   Review of Systems  Skin:  Positive for color change.  All other systems reviewed and are negative.   Physical Exam Updated Vital Signs BP 125/89 (BP Location: Left Arm)   Pulse 72   Temp 98 F (36.7 C) (Oral)   Resp 18   Ht '5\' 1"'$  (1.549 m)   Wt 69.9 kg   LMP 12/31/2021   SpO2 98%   BMI 29.10 kg/m  Physical Exam Vitals and nursing note reviewed.  Constitutional:      General: She is not in acute distress.    Appearance: Normal appearance. She is normal weight. She is not ill-appearing, toxic-appearing or diaphoretic.  HENT:     Head: Normocephalic and atraumatic.  Eyes:     General: No scleral icterus. Cardiovascular:     Rate and Rhythm: Normal rate.  Pulmonary:     Effort: Pulmonary effort is normal. No respiratory distress.  Abdominal:     General: Abdomen is flat.     Palpations: Abdomen is soft.     Tenderness: There is no abdominal tenderness.  Musculoskeletal:        General: Normal range of motion.     Cervical back: Normal range of motion.     Comments: Left wrist with well healed surgical scars. Radial and ulnar pulses intact and 2+. No swelling, erythema, warmth, or deformity. ROM intact without obvious pain.  Subjective numbness noted distal to the left wrist to both the dorsal and palmar aspects of the hand and all 5 fingers. No weakness, capillary refill less than 2 seconds.   No numbness proximal to the left wrist.  Skin:    General: Skin is warm and dry.     Comments: Extremely slight orange hue to bilateral palms.   No signs of jaundice  Neurological:     General: No focal deficit present.     Mental Status: She is alert.  Psychiatric:        Mood and Affect: Mood normal.        Behavior: Behavior normal.     ED Results / Procedures / Treatments   Labs (all labs ordered are listed, but only abnormal results are displayed) Labs Reviewed  - No data to display  EKG None  Radiology No results found.  Procedures Procedures    Medications Ordered in ED Medications - No data to display  ED Course/ Medical Decision Making/ A&P                             Medical Decision Making  Patient presents today with complaints of orange palms. She is afebrile, non-toxic appearing, and in no acute distress with reassuring vital signs. Physical exam potentially reveals a slight orange hue  to the bilateral palms, no obvious signs of color change. No signs of systemic process. No jaundice, itching, or abdominal pain. She does have some numbness that is subjective distal to the left wrist, however documentation from patients surgeon on 2/13 it appears that this is unchanged. She is not weak and numbness does not traverse proximal to the wrist. No concern for CVA or vascular injury. No concern for septic arthritis or infectious process. 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 Discussed with patient foods high in beta carotene and recommend avoiding these foods and following up with her pcp as well as her surgeon. She is reasonable to be discharged.  Of note, went back to my computer to print out information about foods high in beta carotene and following this when I went to discuss this as well as discharge and recommendations for follow-up with the patient she was found to not be in the room. She unfortunately left without receiving discharge paperwork.  Final Clinical Impression(s) / ED Diagnoses Final diagnoses:  Carotene pigmentation of skin    Rx / DC Orders ED Discharge Orders     None         Nestor Lewandowsky 11/06/22 1335    Sherwood Gambler, MD 11/06/22 1432

## 2022-11-06 NOTE — Telephone Encounter (Signed)
Patient informed of SIBO results. She said the dicyclomine is helping. She left the ED, she said they told her that nothing was wrong with her hands.

## 2022-11-06 NOTE — ED Triage Notes (Signed)
States left hand and wrist pain with both palms turning orange. Had surgery on left hand in Sept. Now adds pain runs up left arm.

## 2022-11-06 NOTE — ED Triage Notes (Signed)
States her hands turned orange looking, with the left one hand going numb. States she was at work when she noticed the color change around 0730.  Also complains of left lower leg pain that started around the same time. Denies V/D but states she has some nausea.

## 2022-11-06 NOTE — Telephone Encounter (Signed)
Pt calling in questioning SIBO results.  Please advise if you have seen these come through.

## 2022-11-06 NOTE — Telephone Encounter (Signed)
Inbound call from patient requesting a call back t discuss results.

## 2022-11-06 NOTE — Telephone Encounter (Signed)
Left message for pt to call back  °

## 2022-11-06 NOTE — Telephone Encounter (Signed)
Patient returned the call 

## 2022-11-06 NOTE — Telephone Encounter (Signed)
Have you seen these Sir?

## 2022-11-06 NOTE — Telephone Encounter (Signed)
SIBO test is negative  Also let her know that I do not think the changes in her hands are related to GI issues (My Chart message )  She has f/u me 3/20  If dicyclomine is not helping her abdominal sxs I can rx something else  (She is in ED right now about hand sxs)

## 2022-11-09 ENCOUNTER — Encounter: Payer: Self-pay | Admitting: Internal Medicine

## 2022-11-09 ENCOUNTER — Ambulatory Visit: Payer: BC Managed Care – PPO | Attending: Internal Medicine | Admitting: Internal Medicine

## 2022-11-09 VITALS — BP 118/74 | HR 73 | Ht 61.0 in | Wt 154.6 lb

## 2022-11-09 DIAGNOSIS — I428 Other cardiomyopathies: Secondary | ICD-10-CM

## 2022-11-09 DIAGNOSIS — E876 Hypokalemia: Secondary | ICD-10-CM

## 2022-11-09 DIAGNOSIS — I5022 Chronic systolic (congestive) heart failure: Secondary | ICD-10-CM

## 2022-11-09 DIAGNOSIS — I10 Essential (primary) hypertension: Secondary | ICD-10-CM

## 2022-11-09 DIAGNOSIS — Z95810 Presence of automatic (implantable) cardiac defibrillator: Secondary | ICD-10-CM | POA: Diagnosis not present

## 2022-11-09 MED ORDER — NITROGLYCERIN 0.4 MG SL SUBL: 0.4000 mg | SUBLINGUAL_TABLET | SUBLINGUAL | 12 refills | Status: DC | PRN

## 2022-11-09 NOTE — Patient Instructions (Addendum)
Medication Instructions:  Your physician recommends that you continue on your current medications as directed. Please refer to the Current Medication list given to you today.  *If you need a refill on your cardiac medications before your next appointment, please call your pharmacy*  Lab Work: BMET lab draw ordered for 11/10/2022;  Pt will have lab redrawn after work tomorrow per Dr. Virl Axe order.   Testing/Procedures: None ordered.  Follow-Up: At [facility], you and your health needs are our priority.  As part of our continuing mission to provide you with exceptional heart care, we have created designated Provider Care Teams.  These Care Teams include your primary Cardiologist (physician) and Advanced Practice Providers (APPs -  Physician Assistants and Nurse Practitioners) who all work together to provide you with the care you need, when you need it.  Your next remote BiV ICD check with our [facility] is scheduled for 01/22/2023.     Your next appointment:   1 year(s)  The format for your next appointment:   In Person  Provider:   Virl Axe, MD{or one of the following Advanced Practice Providers on your designated Care Team:   [name], [place] Legrand Como "[facility]" [place], [place]

## 2022-11-09 NOTE — Progress Notes (Signed)
Patient Care Team: Caryl Bis, MD as PCP - General (Family Medicine) Harl Bowie Alphonse Guild, MD as PCP - Cardiology (Cardiology) Deboraha Sprang, MD as PCP - Electrophysiology (Cardiology)   HPI  Mary Pratt is a 52 y.o. adult Seen in follow-up for CRT-D implanted 2/17 for nonischemic cardiomyopathy and congestive heart failure; GEN change 5/22  She is improved following reprogramming.    The patient denies chest pain, shortness of breath, nocturnal dyspnea, orthopnea or peripheral edema.  There have been no palpitations, lightheadedness or syncope.    Has had this issue with her orange hands, ER notes were read and history reviewed.  Sleep study 12/22 >> AHI < 3.0   DATE TEST EF   10/22 Echo  50 - 55 %   10/22 Myocar  67 %     Date Cr K Hgb  10/19 0.96 3.9 12  10/22 0.92 3.7 11.9  1/24 0.75 3.4 12.8      Records and Results Reviewed--notes from CHF clinic  Past Medical History:  Diagnosis Date   Abnormal pap 9/13   ASCUS + HPV, 11/14 LGSIL   Anxiety    Asthma    Cardiac defibrillator in place    Chronic systolic CHF (congestive heart failure) ([place])    a. 02/2015 Echo: EF 20%, sev dil w/ diff HK, worse @ inf base. Tiv AI, mild MR, mod dil LA, PASP 81mHg.   Functional dyspepsia    early satiety    GERD with stricture    dilated [id number]  HELICOBACTER PYLORI GASTRITIS 01/15/2009   dyspnea, ? reaction to Biaxin/amoxicillin  Pylera 01/15/09 - completed therapy      NICM (nonischemic cardiomyopathy) ([place]    a. 02/2015 Cath: nl cors, EF 15%, mild PAH;  b. 02/2015 Enrolled in VEST trial.   Sickle cell trait ([place]    Situational depression    "son died"    Past Surgical History:  Procedure Laterality Date   BIV ICD GENERATOR CHANGEOUT N/A 01/24/2021   Procedure: BIV ICD GENERATOR CHANGEOUT;  Surgeon: KDeboraha Sprang MD;  [facility];  Service: Cardiovascular;  Laterality: N/A;   CARDIAC CATHETERIZATION N/A 02/26/2015   Procedure:  Right/Left Heart Cath and Coronary Angiography;  Surgeon: TTroy Sine MD;  [facility];  Service: Cardiovascular;  Laterality: N/A;   COLPOSCOPY  2013   neg   EP IMPLANTABLE DEVICE N/A 10/14/2015   Procedure: BiV ICD Insertion CRT-D;  Surgeon: SDeboraha Sprang MD;  [facility];  Service: Cardiovascular;  Laterality: N/A;   ESOPHAGOGASTRODUODENOSCOPY (EGD) WITH ESOPHAGEAL DILATION  11/30/2008   esophageal ring dilated 54 French, H. pylori gastritis   TUBAL LIGATION  1991   WISDOM TOOTH EXTRACTION      Current Outpatient Medications  Medication Sig Dispense Refill   albuterol (PROVENTIL HFA;VENTOLIN HFA) 108 (90 BASE) MCG/ACT inhaler Inhale 2 puffs into the lungs every 6 (six) hours as needed for wheezing or shortness of breath.     Azelastine HCl 137 MCG/SPRAY SOLN Place into both nostrils.     carvedilol (COREG) 25 MG tablet TAKE 1 TABLET (25 MG TOTAL) BY MOUTH TWICE A DAY WITH MEALS (Patient taking differently: Take 25 mg by mouth 2 (two) times daily with a meal.) 180 tablet 1   ENTRESTO 24-26 MG TAKE 1 TABLET BY MOUTH TWICE A DAY 60 tablet 6   fluocinonide ointment (LIDEX) 0AB-123456789% Apply 1 Application topically daily  as needed (eczema).     Fluticasone-Salmeterol (ADVAIR DISKUS) 250-50 MCG/DOSE AEPB Inhale 1 puff into the lungs 2 (two) times daily. 180 each 4   furosemide (LASIX) 20 MG tablet TAKE 1 TABLET BY MOUTH EVERY DAY 90 tablet 1   nitroGLYCERIN (NITROSTAT) 0.4 MG SL tablet Place 1 tablet (0.4 mg total) under the tongue every 5 (five) minutes x 3 doses as needed for chest pain. 25 tablet 12   pantoprazole (PROTONIX) 40 MG tablet Take 40 mg by mouth daily.     sertraline (ZOLOFT) 100 MG tablet Take 100 mg by mouth daily.     spironolactone (ALDACTONE) 25 MG tablet TAKE 1 TABLET (25 MG TOTAL) BY MOUTH DAILY. 90 tablet 1   traZODone (DESYREL) 50 MG tablet Take 100 mg by mouth at bedtime as needed.     No current facility-administered medications for  this visit.    Allergies  Allergen Reactions   Amoxicillin Other (See Comments)    REACTION: Tacycardia   Clarithromycin Hives    Breaks out and gets whelps   Meloxicam Rash   Spiriva Respimat [Tiotropium Bromide Monohydrate] Rash      Review of Systems negative except from HPI and PMH  Physical Exam BP 118/74   Pulse 73   Ht '5\' 1"'$  (1.549 m)   Wt 154 lb 9.6 oz (70.1 kg)   LMP 09/14/2022 (Approximate)   SpO2 98%   BMI 29.21 kg/m  Well developed and well nourished in no acute distress HENT normal Neck supple with JVP-flat Clear Device pocket well healed; without hematoma or erythema.  There is no tethering  Regular rate and rhythm, no  gallop No  murmur Abd-soft with active BS No Clubbing cyanosis  edema Skin-warm and dry A & Oriented  Grossly normal sensory and motor function  ECG sinus with P synchronous pacing at 65 beats intervals 16/13/45 Negative QRS lead I and negative QRS lead V1   Assessment and  Plan NICM-interval normalization  CHF Chronic diastolic  CRT-[employer]  Fatigue  Chest pain  Hypokalemia   Functional status remains stable.  Potassium level is a little low, we will repeat it.  Euvolemic.  Continue spironolactone Entresto Lasix and carvedilol

## 2022-11-10 ENCOUNTER — Ambulatory Visit: Payer: BC Managed Care – PPO | Attending: Internal Medicine

## 2022-11-10 DIAGNOSIS — E876 Hypokalemia: Secondary | ICD-10-CM

## 2022-11-11 LAB — BASIC METABOLIC PANEL
BUN/Creatinine Ratio: 10 (ref 9–23)
BUN: 10 mg/dL (ref 6–24)
CO2: 21 mmol/L (ref 20–29)
Calcium: 9.8 mg/dL (ref 8.7–10.2)
Chloride: 103 mmol/L (ref 96–106)
Creatinine, Ser: 0.96 mg/dL (ref 0.57–1.00)
Glucose: 129 mg/dL — ABNORMAL HIGH (ref 70–99)
Potassium: 4 mmol/L (ref 3.5–5.2)
Sodium: 139 mmol/L (ref 134–144)
eGFR: 71 mL/min/{1.73_m2} (ref >59)

## 2022-11-17 NOTE — Progress Notes (Signed)
Remote ICD transmission.   

## 2022-11-18 ENCOUNTER — Telehealth: Payer: Self-pay | Admitting: Neurology

## 2022-11-18 ENCOUNTER — Other Ambulatory Visit (INDEPENDENT_AMBULATORY_CARE_PROVIDER_SITE_OTHER): Payer: BC Managed Care – PPO

## 2022-11-18 DIAGNOSIS — R202 Paresthesia of skin: Secondary | ICD-10-CM

## 2022-11-18 DIAGNOSIS — R200 Anesthesia of skin: Secondary | ICD-10-CM

## 2022-11-18 LAB — B12 AND FOLATE PANEL
Folate: 12 ng/mL (ref >5.9)
Vitamin B-12: 167 pg/mL — ABNORMAL LOW (ref 211–911)

## 2022-11-18 LAB — TSH: TSH: 0.41 u[IU]/mL (ref 0.35–5.50)

## 2022-11-18 NOTE — Telephone Encounter (Signed)
I would start with checking blood work:  ANA with reflex, ENA, B12, folate, B1, B6, TSH, ACE, Lyme, IFE, heavy metal screen.  She will need to follow up after labs are completed (in about a week) for further evaluation.  Patient advised of note above, Patient agree to lab work.   Front desk can you call patient to schedule for a week out, to See Dr.Jaffe.

## 2022-11-18 NOTE — Telephone Encounter (Signed)
Pt called in stating she has been having numbness and tingling in her feet, hands, and legs. She has also been having burning sensations all over her body. She is wondering if she might need an EMG?

## 2022-11-18 NOTE — Telephone Encounter (Signed)
Per Patient she was advised the discoloration was due to allergic reaction to a chemical at work.  Per Patient the numbness and burning sensation started three weeks ago. Patient states she did advise her PCP of this and they advised her to go the Neurologist, Referral was sent out to another Neurologist office but she unable to be seen until August.    Please advise.

## 2022-11-20 ENCOUNTER — Telehealth: Payer: Self-pay | Admitting: Neurology

## 2022-11-20 NOTE — Telephone Encounter (Signed)
Advised patient of Dr.Jaffe note,  Per Dr.Jaffe,She should go to urgent care

## 2022-11-20 NOTE — Telephone Encounter (Signed)
Pt called in and left a message. She got blood work done downstairs on 11/18/22 and she has a blood clot where they did the blood work. She has been putting an ice pack on it. She is wondering if there is anything else she needs to do?

## 2022-11-23 LAB — LYME DISEASE, WESTERN BLOT
IgG P18 Ab.: ABSENT
IgG P23 Ab.: ABSENT
IgG P28 Ab.: ABSENT
IgG P30 Ab.: ABSENT
IgG P39 Ab.: ABSENT
IgG P41 Ab.: ABSENT
IgG P45 Ab.: ABSENT
IgG P58 Ab.: ABSENT
IgG P66 Ab.: ABSENT
IgG P93 Ab.: ABSENT
IgM P23 Ab.: ABSENT
IgM P39 Ab.: ABSENT
IgM P41 Ab.: ABSENT
Lyme IgG Wb: NEGATIVE
Lyme IgM Wb: NEGATIVE

## 2022-11-23 LAB — IMMUNOFIXATION, SERUM
IgA/Immunoglobulin A, Serum: 177 mg/dL (ref 87–352)
IgG (Immunoglobin G), Serum: 1511 mg/dL (ref 586–1602)
IgM (Immunoglobulin M), Srm: 159 mg/dL (ref 26–217)

## 2022-11-23 LAB — ANA+ENA+DNA/DS+SCL 70+SJOSSA/B
ANA Titer 1: NEGATIVE
ENA RNP Ab: <0.2 AI (ref 0.0–0.9)
ENA SM Ab Ser-aCnc: <0.2 AI (ref 0.0–0.9)
ENA SSA (RO) Ab: <0.2 AI (ref 0.0–0.9)
ENA SSB (LA) Ab: <0.2 AI (ref 0.0–0.9)
Scleroderma (Scl-70) (ENA) Antibody, IgG: <0.2 AI (ref 0.0–0.9)
dsDNA Ab: <1 IU/mL (ref 0–9)

## 2022-11-23 LAB — ANA W/REFLEX: Anti Nuclear Antibody (ANA): NEGATIVE

## 2022-11-24 NOTE — Progress Notes (Unsigned)
NEUROLOGY FOLLOW UP OFFICE NOTE  AMORIA MCLEES [id number]  Assessment/Plan:   Dyesthesias/paresthesias - unclear etiology.  Distribution does not correlate with a polyneuropathy - seems more likely to be environmental or related to vitamin deficiency.  At this time, she has B12 deficiency which may be the culprit.   Also with borderline low B1 level Chronic migraine without aura, without status migrainosus, not intractable   Continue B12 shots.  Consider taking thiamine supplement.  Re-evaluate at follow up For migraine prevention, will start Emgality.  Already has been on beta blocker and had potential adverse reaction to topiramate. Follow up on heavy metal screen Follow up 4 months.  Subjective:  ANALA WHISENANT is a 52 year old female with CHF, asthma, cardiac defibrillator, depression and anxiety whom I have seen for migraines/chronic headache presents today for paresthesias.  3 weeks ago, she started experiencing numbness, tingling and burning sensation in the right hand, feet, scalp and torso.  Also she noted her palms looked orange.  She had carpal tunnel surgery in September on the left.  It did not help.  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  She was advised to discontinue topiramate.  No improvement.  Labs on 11/18/2022 revealed low B12 of 167 and B1 of 6 but other labs including ANA, ENA, IFE, Lyme, ACE 32, TSH 0.41, B6 6.6, and folate 12 were unremarkable.  Heavy metal screen still pending.    PAST MEDICAL HISTORY: Past Medical History:  Diagnosis Date   Abnormal pap 9/13   ASCUS + HPV, 11/14 LGSIL   Anxiety    Asthma    Cardiac defibrillator in place    Chronic systolic CHF (congestive heart failure) ([place])    a. 02/2015 Echo: EF 20%, sev dil w/ diff HK, worse @ inf base. Tiv AI, mild MR, mod dil LA, PASP 41mmHg.   Functional dyspepsia    early satiety    GERD with stricture     dilated 8563   HELICOBACTER PYLORI GASTRITIS 01/15/2009   dyspnea, ? reaction to Biaxin/amoxicillin  Pylera 01/15/09 - completed therapy      NICM (nonischemic cardiomyopathy) ([place])    a. 02/2015 Cath: nl cors, EF 15%, mild PAH;  b. 02/2015 Enrolled in VEST trial.   Sickle cell trait ([place])    Situational depression    "son died"    MEDICATIONS: Current Outpatient Medications on File Prior to Visit  Medication Sig Dispense Refill   albuterol (PROVENTIL HFA;VENTOLIN HFA) 108 (90 BASE) MCG/ACT inhaler Inhale 2 puffs into the lungs every 6 (six) hours as needed for wheezing or shortness of breath.     Azelastine HCl 137 MCG/SPRAY SOLN Place into both nostrils.     carvedilol (COREG) 25 MG tablet TAKE 1 TABLET (25 MG TOTAL) BY MOUTH TWICE A DAY WITH MEALS (Patient taking differently: Take 25 mg by mouth 2 (two) times daily with a meal.) 180 tablet 1   ENTRESTO 24-26 MG TAKE 1 TABLET BY MOUTH TWICE A DAY 60 tablet 6   fluocinonide ointment (LIDEX) 1.49 % Apply 1 Application topically daily as needed (eczema).     Fluticasone-Salmeterol (ADVAIR DISKUS) 250-50 MCG/DOSE AEPB Inhale 1 puff into the lungs 2 (two) times daily. 180 each 4   furosemide (LASIX) 20 MG tablet TAKE 1 TABLET BY MOUTH EVERY DAY 90 tablet 1   nitroGLYCERIN (NITROSTAT) 0.4 MG SL tablet Place 1 tablet (  0.4 mg total) under the tongue every 5 (five) minutes x 3 doses as needed for chest pain. 25 tablet 12   pantoprazole (PROTONIX) 40 MG tablet Take 40 mg by mouth daily.     sertraline (ZOLOFT) 100 MG tablet Take 100 mg by mouth daily.     spironolactone (ALDACTONE) 25 MG tablet TAKE 1 TABLET (25 MG TOTAL) BY MOUTH DAILY. 90 tablet 1   traZODone (DESYREL) 50 MG tablet Take 100 mg by mouth at bedtime as needed.     No current facility-administered medications on file prior to visit.    ALLERGIES: Allergies  Allergen Reactions   Amoxicillin Other (See Comments)    REACTION: Tacycardia   Clarithromycin Hives    Breaks out and gets  whelps   Meloxicam Rash   Spiriva Respimat [Tiotropium Bromide Monohydrate] Rash    FAMILY HISTORY: Family History  Problem Relation Age of Onset   Diabetes Mother    Hypertension Mother    Hypothyroidism Mother    Lung cancer Maternal Grandmother    Breast cancer Maternal Aunt    Brain cancer Maternal Uncle    Prostate cancer Paternal Uncle    Colon cancer Neg Hx    Colon polyps Neg Hx    Esophageal cancer Neg Hx       Objective:  Blood pressure 125/86, pulse 78, height 5\' 1"  (1.549 m), weight 155 lb 12.8 oz (70.7 kg), last menstrual period 09/14/2022, SpO2 96 %. General: No acute distress.  Patient appears well-groomed.   Head:  Normocephalic/atraumatic Eyes:  Fundi examined but not visualized Neck: supple, no paraspinal tenderness, full range of motion Heart:  Regular rate and rhythm Neurological Exam: alert and oriented to person, place, and time.  Speech fluent and not dysarthric, language intact.  CN II-XII intact. Bulk and tone normal, muscle strength 5/5 throughout.  Sensation to pinprick reduced in first 3 digits of both hands, reduced vibratory sensation in fingers.  Deep tendon reflexes 3+ in patellars but otherwise 2+ throughout, Hoffman sign absent, Babinski sign absent.  Finger to nose testing intact.  Gait normal, Romberg negative.   Metta Clines, DO  CC: Gar Ponto, MD

## 2022-11-25 ENCOUNTER — Other Ambulatory Visit: Payer: BC Managed Care – PPO

## 2022-11-25 ENCOUNTER — Encounter: Payer: Self-pay | Admitting: Internal Medicine

## 2022-11-25 ENCOUNTER — Ambulatory Visit (INDEPENDENT_AMBULATORY_CARE_PROVIDER_SITE_OTHER): Payer: BC Managed Care – PPO | Admitting: Internal Medicine

## 2022-11-25 VITALS — BP 118/88 | HR 65 | Ht 61.0 in | Wt 154.0 lb

## 2022-11-25 DIAGNOSIS — R0789 Other chest pain: Secondary | ICD-10-CM | POA: Diagnosis not present

## 2022-11-25 DIAGNOSIS — K582 Mixed irritable bowel syndrome: Secondary | ICD-10-CM

## 2022-11-25 DIAGNOSIS — K30 Functional dyspepsia: Secondary | ICD-10-CM | POA: Diagnosis not present

## 2022-11-25 DIAGNOSIS — E538 Deficiency of other specified B group vitamins: Secondary | ICD-10-CM

## 2022-11-25 DIAGNOSIS — R630 Anorexia: Secondary | ICD-10-CM

## 2022-11-25 NOTE — Patient Instructions (Signed)
Your provider has requested that you go to the basement level for lab work before leaving today. Press "B" on the elevator. The lab is located at the first door on the left as you exit the elevator.  Due to recent changes in healthcare laws, you may see the results of your imaging and laboratory studies on MyChart before your provider has had a chance to review them.  We understand that in some cases there may be results that are confusing or concerning to you. Not all laboratory results come back in the same time frame and the provider may be waiting for multiple results in order to interpret others.  Please give [place] 48 hours in order for your provider to thoroughly review all the results before contacting the office for clarification of your results.   Take one tablespoon of Benefiber daily.  _______________________________________________________  If your blood pressure at your visit was 140/90 or greater, please contact your primary care physician to follow up on this.  _______________________________________________________  If you are age 10 or older, your body mass index should be between 23-30. Your Body mass index is 29.1 kg/m. If this is out of the aforementioned range listed, please consider follow up with your Primary Care Provider.  If you are age 31 or younger, your body mass index should be between 19-25. Your Body mass index is 29.1 kg/m. If this is out of the aformentioned range listed, please consider follow up with your Primary Care Provider.   ________________________________________________________  The [facility] providers would like to encourage you to use [facility] to communicate with providers for non-urgent requests or questions.  Due to long hold times on the telephone, sending your provider a message by [facility] may be a faster and more efficient way to get a response.  Please allow 48 business hours for a response.  Please remember that this is for non-urgent requests.   _______________________________________________________   I appreciate the opportunity to care for you. Silvano Rusk, MD, [facility]

## 2022-11-25 NOTE — Progress Notes (Signed)
Mary Pratt 52 y.o. 12-25-1970 [id number]  Assessment & Plan:   Encounter Diagnoses  Name Primary?   Irritable bowel syndrome with both constipation and diarrhea Yes   B12 deficiency    Functional dyspepsia    Right-sided chest wall pain    Anorexia    Hopefully B12 deficiency treatment will improve many of her symptoms.  I think she has some musculoskeletal right-sided flank pain.  Not sure why she has anorexia that may be related to mental health issues.  From chart review it seems like CHF is under control though that is always a potential contributor as well.  Try Benefiber 1 tablespoon daily for constipation and diarrhea pattern and IBS.  Labs as below to try to understand B12 deficiency etiology.  Orders Placed This Encounter  Procedures   Anti-Parietal Antibody   Intrinsic Factor Antibodies   02/11/2023 follow-up with me  CC: Caryl Bis, MD    Subjective:   Chief Complaint: IBS and anorexia  HPI39 year old African-American woman with a history of functional dyspepsia, IBS and chronic recurrent abdominal pain, CHF sclerosing mesenteritis hypertension and GERD who is here for follow-up.   She is reporting some persistent right-sided flank pain she cannot tell me what precipitates it i.e. movement eating etc.  She used dicyclomine which helped other abdominal pain and she is not using it anymore.  She still has mixed pattern of IBS bowel movements with some hard stools and straining and some loose stools at times.  There is no specific pattern or trigger to this that I can tell from talking to her.  She is not taking any fiber supplements or stool softeners etc.  She does not seem to have a pattern of not going for days and then unloading.  She was diagnosed with B12 deficiency by Dr. Tomi Likens of neurology recently and she has had 1 B12 injection in primary care.  She continues to complain of fatigue.  B12 level was 167.  Folate vitamin B1 B6 TSH ACE level and  ANA all  negative.  Heavy metals panel pending.  ENA double-stranded DNA and Sjogren's panel negative.  She says her appetite is off.   Wt Readings from Last 3 Encounters:  11/25/22 154 lb (69.9 kg)  11/09/22 154 lb 9.6 oz (70.1 kg)  11/06/22 153 lb (69.4 kg)     Recent medical history as below:  Cardiology visit 11/09/2022 doing well  ED visit for arms palms 11/06/2022  ED visit right flank pain 10/06/2022 CT abdomen pelvis with small fat-containing umbilical hernia cystic lesions in the liver (max 11 mm)  Visit with me 09/30/2018 for diagnosis IBS mixed and bleeding hemorrhoids.  Lactulose hydrogen breath test was negative for SIBO after this, she was taking dicyclomine and hydrocortisone as needed  In the ED at [facility] with 3 weeks of chest pain 09/16/2022   Last visit [facility] 08/21/2022, right abdominal pain nausea vomiting diarrhea and rectal bleeding.  Labs were normal she was treated with dicyclomine and Compazine after an ED visit at that time.  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 Though she complained of dark stools she was heme-negative in our office that day.  She was treated with a Medrol Dosepak and to use dicyclomine.  She is here for follow-up.     Screening colonoscopy 05/22/2021 was  normal including terminal ileum inspection EGD same day irregular Z-line possible hiatal hernia overall negative/normal  Allergies  Allergen Reactions   Amoxicillin Other (See Comments)    REACTION: Tacycardia   Clarithromycin Hives    Breaks out and gets whelps   Meloxicam Rash   Spiriva Respimat [Tiotropium Bromide Monohydrate] Rash   Current Meds  Medication Sig   albuterol (PROVENTIL HFA;VENTOLIN HFA) 108 (90 BASE) MCG/ACT inhaler Inhale 2 puffs into the lungs every 6 (six) hours as needed for  wheezing or shortness of breath.   Azelastine HCl 137 MCG/SPRAY SOLN Place into both nostrils.   carvedilol (COREG) 25 MG tablet TAKE 1 TABLET (25 MG TOTAL) BY MOUTH TWICE A DAY WITH MEALS (Patient taking differently: Take 25 mg by mouth 2 (two) times daily with a meal.)   ENTRESTO 24-26 MG TAKE 1 TABLET BY MOUTH TWICE A DAY   fluocinonide ointment (LIDEX) [id number] % Apply 1 Application topically daily as needed (eczema).   Fluticasone-Salmeterol (ADVAIR DISKUS) 250-50 MCG/DOSE AEPB Inhale 1 puff into the lungs 2 (two) times daily.   furosemide (LASIX) 20 MG tablet TAKE 1 TABLET BY MOUTH EVERY DAY   nitroGLYCERIN (NITROSTAT) 0.4 MG SL tablet Place 1 tablet (0.4 mg total) under the tongue every 5 (five) minutes x 3 doses as needed for chest pain.   pantoprazole (PROTONIX) 40 MG tablet Take 40 mg by mouth daily.   sertraline (ZOLOFT) 100 MG tablet Take 100 mg by mouth daily.   spironolactone (ALDACTONE) 25 MG tablet TAKE 1 TABLET (25 MG TOTAL) BY MOUTH DAILY.   traZODone (DESYREL) 50 MG tablet Take 100 mg by mouth at bedtime as needed.   Past Medical History:  Diagnosis Date   Abnormal pap 05/2012   ASCUS + HPV, 11/14 LGSIL   Anxiety    Asthma    Cardiac defibrillator in place    Chronic systolic CHF (congestive heart failure) ([place])    a. 02/2015 Echo: EF 20%, sev dil w/ diff HK, worse @ inf base. Tiv AI, mild MR, mod dil LA, PASP 40mmHg.   Functional dyspepsia    early satiety    GERD with stricture    dilated [id number]   HELICOBACTER PYLORI GASTRITIS 01/15/2009   dyspnea, ? reaction to Biaxin/amoxicillin  Pylera 01/15/09 - completed therapy      NICM (nonischemic cardiomyopathy) ([place])    a. 02/2015 Cath: nl cors, EF 15%, mild PAH;  b. 02/2015 Enrolled in VEST trial.   Sickle cell trait ([place])    Situational depression    "son died"   Vitamin B 12 deficiency 11/2022   Past Surgical History:  Procedure Laterality Date   BIV ICD GENERATOR CHANGEOUT N/A 01/24/2021   Procedure: BIV ICD GENERATOR  CHANGEOUT;  Surgeon: Deboraha Sprang, MD;  Location: [place] [facility];  Service: Cardiovascular;  Laterality: N/A;   CARDIAC CATHETERIZATION N/A 02/26/2015   Procedure: Right/Left Heart Cath and Coronary Angiography;  Surgeon: Troy Sine, MD;  Location: [place] [facility];  Service: Cardiovascular;  Laterality: N/A;   COLPOSCOPY  2013   neg   EP IMPLANTABLE DEVICE N/A 10/14/2015   Procedure: BiV ICD Insertion CRT-D;  Surgeon: Deboraha Sprang, MD;  Location: [place] [facility];  Service: Cardiovascular;  Laterality: N/A;   ESOPHAGOGASTRODUODENOSCOPY (EGD) WITH ESOPHAGEAL DILATION  11/30/2008   esophageal ring dilated 5 French, H. 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   Review of Systems As above  Objective:   Physical Exam BP 118/88   Pulse 65   Ht 5\' 1"  (1.549 m)   Wt 154 lb (69.9 kg)   LMP 09/14/2022 (Approximate)   SpO2 94%   BMI 29.10 kg/m  Abd soft mildly tender upper and R ant/lat ribs

## 2022-11-26 ENCOUNTER — Encounter: Payer: Self-pay | Admitting: Neurology

## 2022-11-26 ENCOUNTER — Telehealth: Payer: Self-pay

## 2022-11-26 ENCOUNTER — Ambulatory Visit (INDEPENDENT_AMBULATORY_CARE_PROVIDER_SITE_OTHER): Payer: BC Managed Care – PPO | Admitting: Neurology

## 2022-11-26 VITALS — BP 125/86 | HR 78 | Ht 61.0 in | Wt 155.8 lb

## 2022-11-26 DIAGNOSIS — E538 Deficiency of other specified B group vitamins: Secondary | ICD-10-CM | POA: Diagnosis not present

## 2022-11-26 DIAGNOSIS — G43709 Chronic migraine without aura, not intractable, without status migrainosus: Secondary | ICD-10-CM

## 2022-11-26 DIAGNOSIS — R208 Other disturbances of skin sensation: Secondary | ICD-10-CM

## 2022-11-26 DIAGNOSIS — R202 Paresthesia of skin: Secondary | ICD-10-CM

## 2022-11-26 LAB — HEAVY METALS PANEL, BLOOD
Arsenic: <3 mcg/L (ref <23)
Lead: <1 ug/dL (ref <3.5)
Mercury, B: <5 mcg/L (ref <11)

## 2022-11-26 LAB — VITAMIN B1: Vitamin B1 (Thiamine): 6 nmol/L — ABNORMAL LOW (ref 8–30)

## 2022-11-26 LAB — ANGIOTENSIN CONVERTING ENZYME: Angiotensin-Converting Enzyme: 32 U/L (ref 9–67)

## 2022-11-26 LAB — VITAMIN B6: Vitamin B6: 6.6 ng/mL (ref 2.1–21.7)

## 2022-11-26 MED ORDER — EMGALITY 120 MG/ML ~~LOC~~ SOAJ: 240.0000 mg | Freq: Once | SUBCUTANEOUS | 0 refills | Status: AC

## 2022-11-26 NOTE — Telephone Encounter (Signed)
Patient states she is returning a call to [facility] about blood work.

## 2022-11-26 NOTE — Patient Instructions (Addendum)
Continue B12 injections.  Will follow up on heavy metal screen For migraines, start Emgality - 2 injections for first dose, then 1 injection every 30 days thereafter.  Will need prior authorization.  Let me know when you pick up first dose so that I can send in refills Follow up 4 months.

## 2022-11-27 NOTE — Telephone Encounter (Signed)
Pt left message with AN requesting lab results.

## 2022-11-27 NOTE — Telephone Encounter (Signed)
Called patient and informed her of lab results. Patient wanted to ask Dr. Tomi Likens if he is going to do an EEG and an EMG? She states that her arm is numb and has pain in her right arm.

## 2022-11-27 NOTE — Telephone Encounter (Signed)
Called patient and left a detailed messaeg per DPR that EMG has been ordered and she can call our office to get it scheduled. Provided our phone number for patient to call for appointment. EMG order placed.

## 2022-11-30 LAB — INTRINSIC FACTOR ANTIBODIES: Intrinsic Factor: NEGATIVE

## 2022-11-30 LAB — ANTI-PARIETAL ANTIBODY: PARIETAL CELL AB SCREEN: NEGATIVE

## 2022-12-02 ENCOUNTER — Other Ambulatory Visit: Payer: Self-pay | Admitting: Cardiology

## 2022-12-02 DIAGNOSIS — I5022 Chronic systolic (congestive) heart failure: Secondary | ICD-10-CM

## 2022-12-03 ENCOUNTER — Ambulatory Visit (INDEPENDENT_AMBULATORY_CARE_PROVIDER_SITE_OTHER): Payer: BC Managed Care – PPO | Admitting: Neurology

## 2022-12-03 DIAGNOSIS — R202 Paresthesia of skin: Secondary | ICD-10-CM

## 2022-12-03 NOTE — Procedures (Signed)
[facility]  [address]  [place], [place] [address] Tel: [phone] Fax: [phone] Test Date:  12/03/2022  Patient: Mary Pratt DOB: Aug 08, 1971 Physician: Narda Amber, DO  Sex: Female Height: 5\' 1"  Ref Phys: Metta Clines, DO  ID#: [id number]   Technician:    History: This is a 52 year old female referred for evaluation of generalized paresthesias of the arms and legs.  NCV & EMG Findings: Extensive electrodiagnostic testing of the right upper extremity and additional studies of the left shows:  All sensory responses including the right median, ulnar, mixed palmar, sural, and superficial peroneal nerves are within normal limits. All motor responses including the right median, ulnar, peroneal, and tibial nerves are within normal limits. Right tibial H reflex study is within normal limits. There is no evidence of active or chronic motor axonal loss changes affecting any of the tested muscles.  Motor unit configuration and recruitment pattern is within normal limits.  Impression: This is a normal study of the right upper and lower extremities.  In particular, there is no evidence of a large fiber sensorimotor polyneuropathy or cervical/lumbosacral radiculopathy.   ___________________________ Narda Amber, DO    Nerve Conduction Studies   Stim Site NR Peak (ms) Norm Peak (ms) O-P Amp (V) Norm O-P Amp  Right Median Anti Sensory (2nd Digit)  32 C  Wrist    3.2 <3.6 26.2 >15  Right Sup Peroneal Anti Sensory (Ant Lat Mall)  32 C  12 cm    2.3 <4.6 15.0 >4  Right Sural Anti Sensory (Lat Mall)  32 C  Calf    2.3 <4.6 16.9 >4  Right Ulnar Anti Sensory (5th Digit)  32 C  Wrist    2.5 <3.1 31.7 >10     Stim Site NR Onset (ms) Norm Onset (ms) O-P Amp (mV) Norm O-P Amp Site1 Site2 Delta-0 (ms) Dist (cm) Vel (m/s) Norm Vel (m/s)  Right Median Motor (Abd Poll Brev)  32 C  Wrist    3.2 <4.0 11.3 >6 Elbow Wrist 4.5 29.0 64 >50  Elbow    7.7  10.1          Right Peroneal Motor (Ext Dig Brev)  32 C  Ankle    2.6 <6.0 6.9 >2.5 B Fib Ankle 6.8 36.0 53 >40  B Fib    9.4  6.2  Poplt B Fib 1.5 8.0 53 >40  Poplt    10.9  6.2         Right Tibial Motor (Abd Hall Brev)  32 C  Ankle    3.0 <6.0 7.8 >4 Knee Ankle 7.8 38.0 49 >40  Knee    10.8  6.8         Right Ulnar Motor (Abd Dig Minimi)  32 C  Wrist    2.1 <3.1 10.7 >7 B Elbow Wrist 3.1 19.0 61 >50  B Elbow    5.2  10.5  A Elbow B Elbow 1.9 10.0 53 >50  A Elbow    7.1  10.2            Stim Site NR Peak (ms) Norm Peak (ms) P-T Amp (V) Site1 Site2 Delta-P (ms) Norm Delta (ms)  Right Median/Ulnar Palm Comparison (Wrist - 8cm)  32 C  Median Palm    1.9 <2.2 81.0 Median Palm Ulnar Palm 0.2   Ulnar Palm    1.7 <2.2 13.5       Electromyography   Side Muscle Ins.Act Fibs Fasc  Recrt Amp Dur Poly Activation Comment  Right 1stDorInt Nml Nml Nml Nml Nml Nml Nml Nml N/A  Right PronatorTeres Nml Nml Nml Nml Nml Nml Nml Nml N/A  Right Biceps Nml Nml Nml Nml Nml Nml Nml Nml N/A  Right Triceps Nml Nml Nml Nml Nml Nml Nml Nml N/A  Right Deltoid Nml Nml Nml Nml Nml Nml Nml Nml N/A  Right AntTibialis Nml Nml Nml Nml Nml Nml Nml Nml N/A  Right Gastroc Nml Nml Nml Nml Nml Nml Nml Nml N/A  Right RectFemoris Nml Nml Nml Nml Nml Nml Nml Nml N/A  Right BicepsFemS Nml Nml Nml Nml Nml Nml Nml Nml N/A  Right GluteusMed Nml Nml Nml Nml Nml Nml Nml Nml N/A      Waveforms:

## 2022-12-07 ENCOUNTER — Encounter: Payer: Self-pay | Admitting: Neurology

## 2022-12-08 ENCOUNTER — Other Ambulatory Visit: Payer: Self-pay

## 2022-12-08 ENCOUNTER — Ambulatory Visit: Payer: BC Managed Care – PPO | Admitting: Neurology

## 2022-12-08 DIAGNOSIS — R200 Anesthesia of skin: Secondary | ICD-10-CM

## 2022-12-08 NOTE — Progress Notes (Signed)
Per Dr.Jaffe, For left-sided numbness, can get MRI of brain with and without contrast.

## 2022-12-14 ENCOUNTER — Encounter: Payer: Self-pay | Admitting: Neurology

## 2022-12-15 ENCOUNTER — Other Ambulatory Visit: Payer: Self-pay

## 2022-12-15 DIAGNOSIS — R200 Anesthesia of skin: Secondary | ICD-10-CM

## 2022-12-15 NOTE — Progress Notes (Signed)
MRI Brain W/WO Contrast NO PA Req. Message Scheduling to call patient to schedule. Patient advised.

## 2022-12-15 NOTE — Progress Notes (Signed)
Per Patient  imaging called, Unable to have MRI done there patient has a defibrillator.  New order added for the [facility].

## 2023-01-22 ENCOUNTER — Ambulatory Visit (INDEPENDENT_AMBULATORY_CARE_PROVIDER_SITE_OTHER): Payer: BC Managed Care – PPO

## 2023-01-22 DIAGNOSIS — I428 Other cardiomyopathies: Secondary | ICD-10-CM

## 2023-01-22 LAB — CUP PACEART REMOTE DEVICE CHECK
Battery Remaining Longevity: 38 mo
Battery Voltage: 2.96 V
Brady Statistic AP VP Percent: 0.16 %
Brady Statistic AP VS Percent: 0.01 %
Brady Statistic AS VP Percent: 99.8 %
Brady Statistic AS VS Percent: 0.03 %
Brady Statistic RA Percent Paced: 0.17 %
Brady Statistic RV Percent Paced: 99.33 %
Date Time Interrogation Session: 20240517043724
HighPow Impedance: 72 Ohm
Implantable Lead Connection Status: 753985
Implantable Lead Connection Status: 753985
Implantable Lead Connection Status: 753985
Implantable Lead Implant Date: 20170206
Implantable Lead Implant Date: 20170206
Implantable Lead Implant Date: 20170206
Implantable Lead Location: 753858
Implantable Lead Location: 753859
Implantable Lead Location: 753860
Implantable Lead Model: 4398
Implantable Lead Model: 5076
Implantable Pulse Generator Implant Date: 20220520
Lead Channel Impedance Value: 1083 Ohm
Lead Channel Impedance Value: 1083 Ohm
Lead Channel Impedance Value: 1140 Ohm
Lead Channel Impedance Value: 1254 Ohm
Lead Channel Impedance Value: 1254 Ohm
Lead Channel Impedance Value: 296.578
Lead Channel Impedance Value: 296.578
Lead Channel Impedance Value: 299.908
Lead Channel Impedance Value: 356.187
Lead Channel Impedance Value: 356.187
Lead Channel Impedance Value: 418 Ohm
Lead Channel Impedance Value: 513 Ohm
Lead Channel Impedance Value: 608 Ohm
Lead Channel Impedance Value: 665 Ohm
Lead Channel Impedance Value: 703 Ohm
Lead Channel Impedance Value: 703 Ohm
Lead Channel Impedance Value: 722 Ohm
Lead Channel Impedance Value: 931 Ohm
Lead Channel Pacing Threshold Amplitude: 0.5 V
Lead Channel Pacing Threshold Amplitude: 0.625 V
Lead Channel Pacing Threshold Amplitude: 3.75 V
Lead Channel Pacing Threshold Pulse Width: 0.4 ms
Lead Channel Pacing Threshold Pulse Width: 0.4 ms
Lead Channel Pacing Threshold Pulse Width: 1 ms
Lead Channel Sensing Intrinsic Amplitude: 0.5 mV
Lead Channel Sensing Intrinsic Amplitude: 0.5 mV
Lead Channel Sensing Intrinsic Amplitude: 11 mV
Lead Channel Sensing Intrinsic Amplitude: 11 mV
Lead Channel Setting Pacing Amplitude: 1.5 V
Lead Channel Setting Pacing Amplitude: 2 V
Lead Channel Setting Pacing Amplitude: 3.75 V
Lead Channel Setting Pacing Pulse Width: 0.4 ms
Lead Channel Setting Pacing Pulse Width: 1 ms
Lead Channel Setting Sensing Sensitivity: 0.3 mV
Zone Setting Status: 755011

## 2023-01-28 ENCOUNTER — Telehealth: Payer: Self-pay | Admitting: Neurology

## 2023-01-28 NOTE — Telephone Encounter (Signed)
Pt came in stating we needed her [facility] insurance card for her MRI coming up. I have scanned it into the chart

## 2023-01-29 NOTE — Telephone Encounter (Signed)
Working on the PA

## 2023-02-02 ENCOUNTER — Ambulatory Visit (HOSPITAL_COMMUNITY): Admission: RE | Admit: 2023-02-02 | Payer: BC Managed Care – PPO | Source: Ambulatory Visit

## 2023-02-03 NOTE — Progress Notes (Signed)
Remote ICD transmission.   

## 2023-02-06 HISTORY — PX: HARVEST BONE GRAFT: SHX377

## 2023-02-11 ENCOUNTER — Ambulatory Visit: Payer: BC Managed Care – PPO | Admitting: Internal Medicine

## 2023-02-21 ENCOUNTER — Encounter: Payer: Self-pay | Admitting: Neurology

## 2023-02-22 ENCOUNTER — Other Ambulatory Visit: Payer: Self-pay

## 2023-02-22 MED ORDER — EMGALITY 120 MG/ML ~~LOC~~ SOAJ: 120.0000 mg | SUBCUTANEOUS | 11 refills | Status: DC

## 2023-03-10 ENCOUNTER — Encounter: Payer: Self-pay | Admitting: Radiology

## 2023-03-10 ENCOUNTER — Ambulatory Visit (INDEPENDENT_AMBULATORY_CARE_PROVIDER_SITE_OTHER): Payer: BC Managed Care – PPO | Admitting: Radiology

## 2023-03-10 VITALS — BP 132/86 | Temp 98.1°F

## 2023-03-10 DIAGNOSIS — N950 Postmenopausal bleeding: Secondary | ICD-10-CM | POA: Diagnosis not present

## 2023-03-10 DIAGNOSIS — N898 Other specified noninflammatory disorders of vagina: Secondary | ICD-10-CM

## 2023-03-10 DIAGNOSIS — N760 Acute vaginitis: Secondary | ICD-10-CM

## 2023-03-10 DIAGNOSIS — R1031 Right lower quadrant pain: Secondary | ICD-10-CM | POA: Diagnosis not present

## 2023-03-10 LAB — WET PREP FOR TRICH, YEAST, CLUE

## 2023-03-10 MED ORDER — METRONIDAZOLE 500 MG PO TABS
500.0000 mg | ORAL_TABLET | Freq: Two times a day (BID) | ORAL | 0 refills | Status: DC
Start: 2023-03-10 — End: 2023-10-15

## 2023-03-10 NOTE — Progress Notes (Signed)
      Subjective: Mary Pratt is a 52 y.o. adult who complains of RLQ and right lower back pain x 1 year, intermittent, no associated factors.Goes away without medication. Intermittent bloating as well. No bowel or bladder changes, moves bowels regularly with occasional constipation. Had bright red vaginal spotting last week that lasted 7 days, only when wiping.    Review of Systems  All other systems reviewed and are negative.   Past Medical History:  Diagnosis Date   Abnormal pap 05/2012   ASCUS + HPV, 11/14 LGSIL   Anxiety    Asthma    Cardiac defibrillator in place    Chronic systolic CHF (congestive heart failure) ([facility])    a. 02/2015 Echo: EF 20%, sev dil w/ diff HK, worse @ inf base. Tiv AI, mild MR, mod dil LA, PASP .   Functional dyspepsia    early satiety    GERD with stricture    dilated 2010   HELICOBACTER PYLORI GASTRITIS 01/15/2009   dyspnea, ? reaction to Biaxin/amoxicillin  Pylera 01/15/09 - completed therapy      IBS (irritable bowel syndrome)    NICM (nonischemic cardiomyopathy) ([facility])    a. 02/2015 Cath: nl cors, EF 15%, mild PAH;  b. 02/2015 Enrolled in VEST trial.   Sickle cell trait ([facility])    Situational depression    "son died"   Vitamin B 12 deficiency 11/2022      Objective:  Today's Vitals   03/10/23 0811  BP: 132/86  Temp: 98.1 F (36.7 C)  TempSrc: Oral   There is no height or weight on file to calculate BMI.   Physical Exam Vitals and nursing note reviewed. Exam conducted with a chaperone present.  Constitutional:      Appearance: Normal appearance. She is normal weight.  Pulmonary:     Effort: Pulmonary effort is normal.  Abdominal:     General: Abdomen is flat. Bowel sounds are normal.     Palpations: Abdomen is soft.  Genitourinary:    Vagina: Vaginal discharge present.     Cervix: Normal.     Uterus: Normal.      Adnexa:        Right: Tenderness present. No mass or fullness.         Left: No mass, tenderness or fullness.        Comments: Scant white d/c, wet prep obtained Neurological:     Mental Status: She is alert.  Psychiatric:        Mood and Affect: Mood normal.        Thought Content: Thought content normal.        Judgment: Judgment normal.      Raynelle Fanning, CMA present for exam  Assessment:/Plan:   1. RLQ abdominal pain - Urinalysis,Complete w/RFL Culture - US Transvaginal Non-OB; Future  2. PMB (postmenopausal bleeding) - US Transvaginal Non-OB; Future  3. Vaginal discharge - WET PREP FOR TRICH, YEAST, CLUE  +BV Rx sent for flagyl 500mg  po BID x 7 days  Will contact patient with results of testing completed today. Avoid intercourse until symptoms are resolved. Safe sex encouraged. Avoid the use of soaps or perfumed products in the peri area. Avoid tub baths and sitting in sweaty or wet clothing for prolonged periods of time.

## 2023-03-12 LAB — URINE CULTURE
MICRO NUMBER:: 15158520
Result:: NO GROWTH
SPECIMEN QUALITY:: ADEQUATE

## 2023-03-12 LAB — URINALYSIS, COMPLETE W/RFL CULTURE
Bilirubin Urine: NEGATIVE
Glucose, UA: NEGATIVE
Hgb urine dipstick: NEGATIVE
Hyaline Cast: NONE SEEN /LPF
Ketones, ur: NEGATIVE
Leukocyte Esterase: NEGATIVE
Nitrites, Initial: NEGATIVE
Protein, ur: NEGATIVE
RBC / HPF: NONE SEEN /HPF (ref 0–2)
Specific Gravity, Urine: 1.01 (ref 1.001–1.035)
pH: 7 (ref 5.0–8.0)

## 2023-03-12 LAB — CULTURE INDICATED

## 2023-03-20 ENCOUNTER — Other Ambulatory Visit: Payer: Self-pay

## 2023-03-20 ENCOUNTER — Emergency Department (HOSPITAL_COMMUNITY)
Admission: EM | Admit: 2023-03-20 | Discharge: 2023-03-21 | Disposition: A | Discharge status: Home / Self Care | Payer: BC Managed Care – PPO | Attending: Emergency Medicine | Admitting: Emergency Medicine

## 2023-03-20 ENCOUNTER — Encounter (HOSPITAL_COMMUNITY): Payer: Self-pay

## 2023-03-20 DIAGNOSIS — J45909 Unspecified asthma, uncomplicated: Secondary | ICD-10-CM | POA: Insufficient documentation

## 2023-03-20 DIAGNOSIS — R1012 Left upper quadrant pain: Secondary | ICD-10-CM | POA: Diagnosis not present

## 2023-03-20 DIAGNOSIS — R197 Diarrhea, unspecified: Secondary | ICD-10-CM | POA: Diagnosis not present

## 2023-03-20 DIAGNOSIS — I5022 Chronic systolic (congestive) heart failure: Secondary | ICD-10-CM | POA: Insufficient documentation

## 2023-03-20 LAB — COMPREHENSIVE METABOLIC PANEL
ALT: 18 U/L (ref 0–44)
AST: 20 U/L (ref 15–41)
Albumin: 4 g/dL (ref 3.5–5.0)
Alkaline Phosphatase: 71 U/L (ref 38–126)
Anion gap: 8 (ref 5–15)
BUN: 15 mg/dL (ref 6–20)
CO2: 24 mmol/L (ref 22–32)
Calcium: 9.2 mg/dL (ref 8.9–10.3)
Chloride: 103 mmol/L (ref 98–111)
Creatinine, Ser: 0.95 mg/dL (ref 0.44–1.00)
GFR, Estimated: >60 mL/min (ref >60)
Glucose, Bld: 94 mg/dL (ref 70–99)
Potassium: 4 mmol/L (ref 3.5–5.1)
Sodium: 135 mmol/L (ref 135–145)
Total Bilirubin: 0.6 mg/dL (ref 0.3–1.2)
Total Protein: 7.4 g/dL (ref 6.5–8.1)

## 2023-03-20 LAB — CBC
HCT: 36.3 % (ref 36.0–46.0)
Hemoglobin: 12.1 g/dL (ref 12.0–15.0)
MCH: 27.4 pg (ref 26.0–34.0)
MCHC: 33.3 g/dL (ref 30.0–36.0)
MCV: 82.3 fL (ref 80.0–100.0)
Platelets: 227 10*3/uL (ref 150–400)
RBC: 4.41 MIL/uL (ref 3.87–5.11)
RDW: 14 % (ref 11.5–15.5)
WBC: 6.2 10*3/uL (ref 4.0–10.5)
nRBC: 0 % (ref 0.0–0.2)

## 2023-03-20 LAB — LIPASE, BLOOD: Lipase: 34 U/L (ref 11–51)

## 2023-03-20 NOTE — ED Triage Notes (Signed)
Pt reports:  SOB Started 2 days ago Constipation Started 4 days ago Hx of IBS Diarrhea Started 4 days Fatigue Started months ago Nausea Bloating Started months ago Abdomen pain Tight feeling

## 2023-03-21 MED ORDER — DICYCLOMINE HCL 20 MG PO TABS: 20.0000 mg | ORAL_TABLET | Freq: Two times a day (BID) | ORAL | 0 refills | Status: DC

## 2023-03-21 MED ORDER — SUCRALFATE 1 G PO TABS: 1.0000 g | ORAL_TABLET | Freq: Four times a day (QID) | ORAL | 0 refills | Status: DC | PRN

## 2023-03-21 NOTE — ED Provider Notes (Signed)
AP-EMERGENCY DEPT [facility] Emergency Department Provider Note MRN:  [id number]  Arrival date & time: 03/21/23     Chief Complaint   Abdominal Pain and Diarrhea   History of Present Illness   Mary Pratt is a 52 y.o. year-old adult with a history of IBS, CHF presenting to the ED with chief complaint of abdominal pain.  Bloating abdominal discomfort for the past month, intermittent constipation and diarrhea for the past several days.  Denies chest pain or shortness of breath, pain is worst in the left upper quadrant.  Has noticed specks of black in her stool recently.  Review of Systems  A thorough review of systems was obtained and all systems are negative except as noted in the HPI and PMH.   Patient's Health History    Past Medical History:  Diagnosis Date   Abnormal pap 05/2012   ASCUS + HPV, 11/14 LGSIL   Anxiety    Asthma    Cardiac defibrillator in place    Chronic systolic CHF (congestive heart failure) (HCC)    a. 02/2015 Echo: EF 20%, sev dil w/ diff HK, worse @ inf base. Tiv AI, mild MR, mod dil LA, PASP .   Functional dyspepsia    early satiety    GERD with stricture    dilated 2010   HELICOBACTER PYLORI GASTRITIS 01/15/2009   dyspnea, ? reaction to Biaxin/amoxicillin  Pylera 01/15/09 - completed therapy      IBS (irritable bowel syndrome)    NICM (nonischemic cardiomyopathy) (HCC)    a. 02/2015 Cath: nl cors, EF 15%, mild PAH;  b. 02/2015 Enrolled in VEST trial.   Sickle cell trait (HCC)    Situational depression    "son died"   Vitamin B 12 deficiency 11/2022    Past Surgical History:  Procedure Laterality Date   BIV ICD GENERATOR CHANGEOUT N/A 01/24/2021   Procedure: BIV ICD GENERATOR CHANGEOUT;  Surgeon: Duke Salvia, MD;  Location: [facility] INVASIVE CV LAB;  Service: Cardiovascular;  Laterality: N/A;   CARDIAC CATHETERIZATION N/A 02/26/2015   Procedure: Right/Left Heart Cath and Coronary Angiography;  Surgeon: Lennette Bihari, MD;  Location: [facility]  INVASIVE CV LAB;  Service: Cardiovascular;  Laterality: N/A;   COLPOSCOPY  2013   neg   EP IMPLANTABLE DEVICE N/A 10/14/2015   Procedure: BiV ICD Insertion CRT-D;  Surgeon: Duke Salvia, MD;  Location: [facility] INVASIVE CV LAB;  Service: Cardiovascular;  Laterality: N/A;   ESOPHAGOGASTRODUODENOSCOPY (EGD) WITH ESOPHAGEAL DILATION  11/30/2008   esophageal ring dilated 54 French, H. pylori gastritis   TUBAL LIGATION  1991   WISDOM TOOTH EXTRACTION      Family History  Problem Relation Age of Onset   Diabetes Mother    Hypertension Mother    Hypothyroidism Mother    Lung cancer Maternal Grandmother    Breast cancer Maternal Aunt    Brain cancer Maternal Uncle    Prostate cancer Paternal Uncle    Colon cancer Neg Hx    Colon polyps Neg Hx    Esophageal cancer Neg Hx     Social History   Socioeconomic History   Marital status: Single    Spouse name: Not on file   Number of children: 3   Years of education: Not on file   Highest education level: Not on file  Occupational History   Occupation: texturing operator    Employer: [employer]  Tobacco Use   Smoking status: Never   Smokeless tobacco: Never  Vaping Use  Vaping status: Never Used  Substance and Sexual Activity   Alcohol use: Not Currently    Comment: occasional wine   Drug use: No   Sexual activity: Yes    Partners: Male    Birth control/protection: Surgical    Comment: BTL  Other Topics Concern   Not on file  Social History Narrative   Single, 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   2 sons 1 daughter 1 son deceased   Drinks caffeine up to a few a day, never smoker no drug use   Occ wine   Social Determinants of Corporate investment banker Strain: Not on file  Food Insecurity: Not on file  Transportation Needs: Not on file  Physical Activity: Not on file  Stress: Not on file  Social Connections: Unknown (01/20/2022)   [facility]   Social Network    Social  Network: Not on file  Intimate Partner Violence: Unknown (01/20/2022)   Received from [facility]   HITS    Physically Hurt: Not on file    Insult or Talk Down To: Not on file    Threaten Physical Harm: Not on file    Scream or Curse: Not on file     Physical Exam   Vitals:   03/20/23 1705 03/20/23 2138  BP: 111/77 (!) 145/104  Pulse: 67 (!) 57  Resp: 20 18  Temp: 98.1 F (36.7 C) 98.3 F (36.8 C)  SpO2: 99% 98%    CONSTITUTIONAL: Well-appearing, NAD NEURO/PSYCH:  Alert and oriented x 3, no focal deficits EYES:  eyes equal and reactive ENT/NECK:  no LAD, no JVD CARDIO: Regular rate, well-perfused, normal S1 and S2 PULM:  CTAB no wheezing or rhonchi GI/GU:  non-distended, non-tender MSK/SPINE:  No gross deformities, no edema SKIN:  no rash, atraumatic   *Additional and/or pertinent findings included in MDM below  Diagnostic and Interventional Summary    EKG Interpretation Date/Time:    Ventricular Rate:    PR Interval:    QRS Duration:    QT Interval:    QTC Calculation:   R Axis:      Text Interpretation:         Labs Reviewed  LIPASE, BLOOD  COMPREHENSIVE METABOLIC PANEL  CBC  URINALYSIS, ROUTINE W REFLEX MICROSCOPIC    No orders to display    Medications - No data to display   Procedures  /  Critical Care Procedures  ED Course and Medical Decision Making  Initial Impression and Ddx Bloating abdominal discomfort, left upper quadrant, possibly a small amounts of black stool.  Question gastric ulcer, GERD.  Abdomen is completely soft and nontender with no rebound guarding or rigidity.  Vital signs are normal.  Overall very low concern for emergent intra-abdominal process.  Symptoms also could be explained by patient's known IBS.  Past medical/surgical history that increases complexity of ED encounter: CHF, IBS  Interpretation of Diagnostics I personally reviewed the laboratory assessment and my interpretation is as follows: No  blood count or electrolyte disturbances    Patient Reassessment and Ultimate Disposition/Management     Shared decision making utilized regarding advanced imaging, CT offered patient, patient defers CT imaging at this time, she feels comfortable enough going home with an increased GERD regimen, close PCP follow-up.  Patient management required discussion with the following services or consulting groups:  None  Complexity of Problems Addressed Acute illness or injury that poses threat of life of  bodily function  Additional Data Reviewed and Analyzed Further history obtained from: Further history from spouse/family member  Additional Factors Impacting ED Encounter Risk Prescriptions  Elmer Sow. Pilar Plate, MD [facility] Emergency Medicine [facility] [email or web address]  Final Clinical Impressions(s) / ED Diagnoses     ICD-10-CM   1. Left upper quadrant abdominal pain  R10.12       ED Discharge Orders          Ordered    sucralfate (CARAFATE) 1 g tablet  4 times daily PRN        03/21/23 0032    dicyclomine (BENTYL) 20 MG tablet  2 times daily        03/21/23 0032             Discharge Instructions Discussed with and Provided to Patient:    Discharge Instructions      You were evaluated in the Emergency Department and after careful evaluation, we did not find any emergent condition requiring admission or further testing in the hospital.  Your exam/testing today is overall reassuring.  Symptoms may be due to a stomach ulcer or acid related pain.  Recommend increasing your Protonix to twice a day.  Use the Carafate up to 4 times daily as needed for pain.  Can also use the Bentyl as needed for discomfort.  Follow-up with your primary care doctor or a GI expert.  Please return to the Emergency Department if you experience any worsening of your condition.   Thank you for allowing [place] to be a part of your care.      Sabas Sous, MD 03/21/23 [phone]

## 2023-03-21 NOTE — Discharge Instructions (Addendum)
You were evaluated in the Emergency Department and after careful evaluation, we did not find any emergent condition requiring admission or further testing in the hospital.  Your exam/testing today is overall reassuring.  Symptoms may be due to a stomach ulcer or acid related pain.  Recommend increasing your Protonix to twice a day.  Use the Carafate up to 4 times daily as needed for pain.  Can also use the Bentyl as needed for discomfort.  Follow-up with your primary care doctor or a GI expert.  Please return to the Emergency Department if you experience any worsening of your condition.   Thank you for allowing [place] to be a part of your care.

## 2023-03-26 ENCOUNTER — Ambulatory Visit: Payer: BC Managed Care – PPO | Admitting: Allergy & Immunology

## 2023-03-26 ENCOUNTER — Encounter: Payer: Self-pay | Admitting: Allergy & Immunology

## 2023-03-26 VITALS — BP 114/86 | HR 100 | Temp 98.2°F | Resp 14 | Ht 60.63 in | Wt 158.0 lb

## 2023-03-26 DIAGNOSIS — J302 Other seasonal allergic rhinitis: Secondary | ICD-10-CM | POA: Diagnosis not present

## 2023-03-26 DIAGNOSIS — J3089 Other allergic rhinitis: Secondary | ICD-10-CM | POA: Diagnosis not present

## 2023-03-26 DIAGNOSIS — J4540 Moderate persistent asthma, uncomplicated: Secondary | ICD-10-CM | POA: Diagnosis not present

## 2023-03-26 NOTE — Patient Instructions (Addendum)
1. Moderate persistent asthma, uncomplicated - Lung testing looks awesome today. - We are not going to make any changes at all.  - Daily controller medication(s): Advair 250/37mcg one puff twice daily - Prior to physical activity: albuterol 2 puffs 10-15 minutes before physical activity. - Rescue medications: albuterol 4 puffs every 4-6 hours as needed - Asthma control goals:  * Full participation in all desired activities (may need albuterol before activity) * Albuterol use two time or less a week on average (not counting use with activity) * Cough interfering with sleep two time or less a month * Oral steroids no more than once a year * No hospitalizations  2. Seasonal and perennial allergic rhinitis - Testing today showed: grasses, ragweed, weeds, indoor molds, dust mites, dog, and cockroach - Copy of test results provided.  - Avoidance measures provided. - Continue with: Astelin (azelastine) 2 sprays per nostril 1-2 times daily as needed - Start taking: Zyrtec (cetirizine) 10mg  tablet once daily - You can use an extra dose of the antihistamine, if needed, for breakthrough symptoms.  - Consider nasal saline rinses 1-2 times daily to remove allergens from the nasal cavities as well as help with mucous clearance (this is especially helpful to do before the nasal sprays are given) - We will start allergy shots as a means of long-term control. - Allergy shots "re-train" and "reset" the immune system to ignore environmental allergens and decrease the resulting immune response to those allergens (sneezing, itchy watery eyes, runny nose, nasal congestion, etc).    - Allergy shots improve symptoms in 75-85% of patients.   3. Return in about 6 months (around 09/26/2023). You can have the follow up appointment with Dr. Dellis Anes or a Nurse Practicioner (our Nurse Practitioners are excellent and always have Physician oversight!).    Please inform us of any Emergency Department visits,  hospitalizations, or changes in symptoms. Call us before going to the ED for breathing or allergy symptoms since we might be able to fit you in for a sick visit. Feel free to contact us anytime with any questions, problems, or concerns.  It was a pleasure to meet you today!  Websites that have reliable patient information: 1. American Academy of Asthma, Allergy, and Immunology: www.aaaai.org 2. Food Allergy Research and Education (FARE): foodallergy.org 3. Mothers of Asthmatics: http://www.asthmacommunitynetwork.org 4. American College of Allergy, Asthma, and Immunology: www.acaai.org   COVID-19 Vaccine Information can be found at: [email or web address] For questions related to vaccine distribution or appointments, please email [email or web address] or call [phone].   We realize that you might be concerned about having an allergic reaction to the COVID19 vaccines. To help with that concern, WE ARE OFFERING THE COVID19 VACCINES IN OUR OFFICE! Ask the front desk for dates!     "Like" [place] on Facebook and Instagram for our latest updates!      A healthy democracy works best when [employer] participate! Make sure you are registered to vote! If you have moved or changed any of your contact information, you will need to get this updated before voting!  In some cases, you MAY be able to register to vote online: [email or web address]       Airborne Adult Perc - 03/26/23 1541     Time Antigen Placed 1520    Allergen Manufacturer Waynette Buttery    Location Back    Number of Test 55    1. Control-Buffer 50% Glycerol Negative    2. Control-Histamine 3+    3. Bahia Negative  4. [place] Southern Territ[place] Negative    5. Johnson Negative    6. Kentucky Blue Negative    7. Meadow Fescue Negative    8. Perennial Rye Negative    9. Timothy Negative    10. Ragweed Mix Negative    11. Cocklebur Negative    12. Plantain,  English  Negative    13. Baccharis Negative    14. Dog Fennel Negative    15. Russian Thistle Negative    16. Lamb's Quarters 3+    17. Sheep Sorrell 3+    18. Rough Pigweed 3+    19. Marsh Elder, Rough Negative    20. Mugwort, Common 2+    21. Box, Elder Negative    22. Cedar, red Negative    23. Sweet Gum Negative    24. Pecan Pollen Negative    25. Pine Mix Negative    26. Walnut, Black Pollen Negative    27. Red Mulberry Negative    28. Ash Mix Negative    29. Birch Mix Negative    30. Beech American Negative    31. Cottonwood, [place] Negative    32. Hickory, White Negative    33. Maple Mix Negative    34. Oak, [place] Mix Negative    35. Sycamore Eastern Negative    36. Alternaria Alternata Negative    37. Cladosporium Herbarum Negative    38. Aspergillus Mix Negative    39. Penicillium Mix Negative    40. Bipolaris Sorokiniana (Helminthosporium) Negative    41. Drechslera Spicifera (Curvularia) Negative    42. Mucor Plumbeus Negative    43. Fusarium Moniliforme Negative    44. Aureobasidium Pullulans (pullulara) Negative    45. Rhizopus Oryzae Negative    46. Botrytis Cinera Negative    47. Epicoccum Nigrum Negative    48. Phoma Betae Negative    49. Dust Mite Mix 4+    50. Cat Hair 10,000 BAU/ml Negative    51.  Dog Epithelia Negative    52. Mixed Feathers Negative    53. Horse Epithelia Negative    54. Cockroach, German 4+    55. Tobacco Leaf Negative             Intradermal - 03/26/23 1542     Time Antigen Placed 1545    Allergen Manufacturer Waynette Buttery    Location Arm    Number of Test 13    Control Negative    Bahia 1+    French Southern Territories Negative    Johnson Negative    7 Grass Negative    Ragweed Mix 3+    Tree Mix Negative    Mold 1 Negative    Mold 2 Negative    Mold 3 Negative    Mold 4 1+    Cat Negative    Dog 3+             Reducing Pollen Exposure  The American Academy of Allergy, Asthma and Immunology suggests the following steps to reduce  your exposure to pollen during allergy seasons.    Do not hang sheets or clothing out to dry; pollen may collect on these items. Do not mow lawns or spend time around freshly cut grass; mowing stirs up pollen. Keep windows closed at night.  Keep car windows closed while driving. Minimize morning activities outdoors, a time when pollen counts are usually at their highest. Stay indoors as much as possible when pollen counts or humidity is high and on windy days when pollen tends to  remain in the air longer. Use air conditioning when possible.  Many air conditioners have filters that trap the pollen spores. Use a HEPA room air filter to remove pollen form the indoor air you breathe.  Control of Mold Allergen   Mold and fungi can grow on a variety of surfaces provided certain temperature and moisture conditions exist.  Outdoor molds grow on plants, decaying vegetation and soil.  The major outdoor mold, Alternaria and Cladosporium, are found in very high numbers during hot and dry conditions.  Generally, a late Summer - Fall peak is seen for common outdoor fungal spores.  Rain will temporarily lower outdoor mold spore count, but counts rise rapidly when the rainy period ends.  The most important indoor molds are Aspergillus and Penicillium.  Dark, humid and poorly ventilated basements are ideal sites for mold growth.  The next most common sites of mold growth are the bathroom and the kitchen.  Indoor (Perennial) Mold Control   Positive indoor molds via skin testing: Fusarium, Aureobasidium (Pullulara), and Rhizopus  Maintain humidity below 50%. Clean washable surfaces with 5% bleach solution. Remove sources e.g. contaminated carpets.    Control of Dust Mite Allergen    Dust mites play a major role in allergic asthma and rhinitis.  They occur in environments with high humidity wherever human skin is found.  Dust mites absorb humidity from the atmosphere (ie, they do not drink) and feed on  organic matter (including shed human and animal skin).  Dust mites are a microscopic type of insect that you cannot see with the naked eye.  High levels of dust mites have been detected from mattresses, pillows, carpets, upholstered furniture, bed covers, clothes, soft toys and any woven material.  The principal allergen of the dust mite is found in its feces.  A gram of dust may contain 1,000 mites and 250,000 fecal particles.  Mite antigen is easily measured in the air during house cleaning activities.  Dust mites do not bite and do not cause harm to humans, other than by triggering allergies/asthma.    Ways to decrease your exposure to dust mites in your home:  Encase mattresses, box springs and pillows with a mite-impermeable barrier or cover   Wash sheets, blankets and drapes weekly in hot water (130 F) with detergent and dry them in a dryer on the hot setting.  Have the room cleaned frequently with a vacuum cleaner and a damp dust-mop.  For carpeting or rugs, vacuuming with a vacuum cleaner equipped with a high-efficiency particulate air (HEPA) filter.  The dust mite allergic individual should not be in a room which is being cleaned and should wait 1 hour after cleaning before going into the room. Do not sleep on upholstered furniture (eg, couches).   If possible removing carpeting, upholstered furniture and drapery from the home is ideal.  Horizontal blinds should be eliminated in the rooms where the person spends the most time (bedroom, study, television room).  Washable vinyl, roller-type shades are optimal. Remove all non-washable stuffed toys from the bedroom.  Wash stuffed toys weekly like sheets and blankets above.   Reduce indoor humidity to less than 50%.  Inexpensive humidity monitors can be purchased at most hardware stores.  Do not use a humidifier as can make the problem worse and are not recommended.  Control of Dog or Cat Allergen  Avoidance is the best way to manage a dog or cat  allergy. If you have a dog or cat and are allergic  to dog or cats, consider removing the dog or cat from the home. If you have a dog or cat but don't want to find it a new home, or if your family wants a pet even though someone in the household is allergic, here are some strategies that may help keep symptoms at bay:  Keep the pet out of your bedroom and restrict it to only a few rooms. Be advised that keeping the dog or cat in only one room will not limit the allergens to that room. Don't pet, hug or kiss the dog or cat; if you do, wash your hands with soap and water. High-efficiency particulate air (HEPA) cleaners run continuously in a bedroom or living room can reduce allergen levels over time. Regular use of a high-efficiency vacuum cleaner or a central vacuum can reduce allergen levels. Giving your dog or cat a bath at least once a week can reduce airborne allergen.  Control of Cockroach Allergen  Cockroach allergen has been identified as an important cause of acute attacks of asthma, especially in urban settings.  There are fifty-five species of cockroach that exist in the [place], however only three, the [place], [place] species produce allergen that can affect patients with Asthma.  Allergens can be obtained from fecal particles, egg casings and secretions from cockroaches.    Remove food sources. Reduce access to water. Seal access and entry points. Spray runways with 0.5-1% Diazinon or Chlorpyrifos Blow boric acid power under stoves and refrigerator. Place bait stations (hydramethylnon) at feeding sites.  Allergy Shots  Allergies are the result of a chain reaction that starts in the immune system. Your immune system controls how your body defends itself. For instance, if you have an allergy to pollen, your immune system identifies pollen as an invader or allergen. Your immune system overreacts by producing antibodies called Immunoglobulin E (IgE). These antibodies  travel to cells that release chemicals, causing an allergic reaction.  The concept behind allergy immunotherapy, whether it is received in the form of shots or tablets, is that the immune system can be desensitized to specific allergens that trigger allergy symptoms. Although it requires time and patience, the payback can be long-term relief. Allergy injections contain a dilute solution of those substances that you are allergic to based upon your skin testing and allergy history.   How Do Allergy Shots Work?  Allergy shots work much like a vaccine. Your body responds to injected amounts of a particular allergen given in increasing doses, eventually developing a resistance and tolerance to it. Allergy shots can lead to decreased, minimal or no allergy symptoms.  There generally are two phases: build-up and maintenance. Build-up often ranges from three to six months and involves receiving injections with increasing amounts of the allergens. The shots are typically given once or twice a week, though more rapid build-up schedules are sometimes used.  The maintenance phase begins when the most effective dose is reached. This dose is different for each person, depending on how allergic you are and your response to the build-up injections. Once the maintenance dose is reached, there are longer periods between injections, typically two to four weeks.  Occasionally doctors give cortisone-type shots that can temporarily reduce allergy symptoms. These types of shots are different and should not be confused with allergy immunotherapy shots.  Who Can Be Treated with Allergy Shots?  Allergy shots may be a good treatment approach for people with allergic rhinitis (hay fever), allergic asthma, conjunctivitis (eye allergy) or  stinging insect allergy.   Before deciding to begin allergy shots, you should consider:   The length of allergy season and the severity of your symptoms  Whether medications and/or changes  to your environment can control your symptoms  Your desire to avoid long-term medication use  Time: allergy immunotherapy requires a major time commitment  Cost: may vary depending on your insurance coverage  Allergy shots for children age 47 and older are effective and often well tolerated. They might prevent the onset of new allergen sensitivities or the progression to asthma.  Allergy shots are not started on patients who are pregnant but can be continued on patients who become pregnant while receiving them. In some patients with other medical conditions or who take certain common medications, allergy shots may be of risk. It is important to mention other medications you talk to your allergist.   What are the two types of build-ups offered:   RUSH or Rapid Desensitization -- one day of injections lasting from 8:30-4:30pm, injections every 1 hour.  Approximately half of the build-up process is completed in that one day.  The following week, normal build-up is resumed, and this entails ~16 visits either weekly or twice weekly, until reaching your "maintenance dose" which is continued weekly until eventually getting spaced out to every month for a duration of 3 to 5 years. The regular build-up appointments are nurse visits where the injections are administered, followed by required monitoring for 30 minutes.    Traditional build-up -- weekly visits for 6 -12 months until reaching "maintenance dose", then continue weekly until eventually spacing out to every 4 weeks as above. At these appointments, the injections are administered, followed by required monitoring for 30 minutes.     Either way is acceptable, and both are equally effective. With the rush protocol, the advantage is that less time is spent here for injections overall AND you would also reach maintenance dosing faster (which is when the clinical benefit starts to become more apparent). Not everyone is a candidate for rapid  desensitization.   IF we proceed with the RUSH protocol, there are premedications which must be taken the day before and the day after the rush only (this includes antihistamines, steroids, and Singulair).  After the rush day, no prednisone or Singulair is required, and we just recommend antihistamines taken on your injection day.  What Is An Estimate of the Costs?  If you are interested in starting allergy injections, please check with your insurance company about your coverage for both allergy vial sets and allergy injections.  Please do so prior to making the appointment to start injections.  The following are CPT codes to give to your insurance company. These are the amounts we BILL to the insurance company, but the amount YOU WILL PAY and WE RECEIVE IS SUBSTANTIALLY LESS and depends on the contracts we have with different insurance companies.   Amount Billed to Insurance One allergy vial set  CPT 95165   $ 1200     Two allergy vial set  CPT 95165   $ 2400     Three allergy vial set  CPT 95165   $ 3600     One injection   CPT 95115   $ 35  Two injections   CPT 95117   $ 40 RUSH (Rapid Desensitization) CPT 95180 x 8 hours $500/hour  Regarding the allergy injections, your co-pay may or may not apply with each injection, so please confirm this with your insurance company.  When you start allergy injections, 1 or 2 sets of vials are made based on your allergies.  Not all patients can be on one set of vials. A set of vials lasts 6 months to a year depending on how quickly you can proceed with your build-up of your allergy injections. Vials are personalized for each patient depending on their specific allergens.  How often are allergy injection given during the build-up period?   Injections are given at least weekly during the build-up period until your maintenance dose is achieved. Per the doctor's discretion, you may have the option of getting allergy injections two times per week during the  build-up period. However, there must be at least 48 hours between injections. The build-up period is usually completed within 6-12 months depending on your ability to schedule injections and for adjustments for reactions. When maintenance dose is reached, your injection schedule is gradually changed to every two weeks and later to every three weeks. Injections will then continue every 4 weeks. Usually, injections are continued for a total of 3-5 years.   When Will I Feel Better?  Some may experience decreased allergy symptoms during the build-up phase. For others, it may take as long as 12 months on the maintenance dose. If there is no improvement after a year of maintenance, your allergist will discuss other treatment options with you.  If you aren't responding to allergy shots, it may be because there is not enough dose of the allergen in your vaccine or there are missing allergens that were not identified during your allergy testing. Other reasons could be that there are high levels of the allergen in your environment or major exposure to non-allergic triggers like tobacco smoke.  What Is the Length of Treatment?  Once the maintenance dose is reached, allergy shots are generally continued for three to five years. The decision to stop should be discussed with your allergist at that time. Some people may experience a permanent reduction of allergy symptoms. Others may relapse and a longer course of allergy shots can be considered.  What Are the Possible Reactions?  The two types of adverse reactions that can occur with allergy shots are local and systemic. Common local reactions include very mild redness and swelling at the injection site, which can happen immediately or several hours after. Report a delayed reaction from your last injection. These include arm swelling or runny nose, watery eyes or cough that occurs within 12-24 hours after injection. A systemic reaction, which is less common, affects  the entire body or a particular body system. They are usually mild and typically respond quickly to medications. Signs include increased allergy symptoms such as sneezing, a stuffy nose or hives.   Rarely, a serious systemic reaction called anaphylaxis can develop. Symptoms include swelling in the throat, wheezing, a feeling of tightness in the chest, nausea or dizziness. Most serious systemic reactions develop within 30 minutes of allergy shots. This is why it is strongly recommended you wait in your doctor's office for 30 minutes after your injections. Your allergist is trained to watch for reactions, and his or her staff is trained and equipped with the proper medications to identify and treat them.   Report to the nurse immediately if you experience any of the following symptoms: swelling, itching or redness of the skin, hives, watery eyes/nose, breathing difficulty, excessive sneezing, coughing, stomach pain, diarrhea, or light headedness. These symptoms may occur within 15-20 minutes after injection and may require medication.   Who  Should Administer Allergy Shots?  The preferred location for receiving shots is your prescribing allergist's office. Injections can sometimes be given at another facility where the physician and staff are trained to recognize and treat reactions, and have received instructions by your prescribing allergist.  What if I am late for an injection?   Injection dose will be adjusted depending upon how many days or weeks you are late for your injection.   What if I am sick?   Please report any illness to the nurse before receiving injections. She may adjust your dose or postpone injections depending on your symptoms. If you have fever, flu, sinus infection or chest congestion it is best to postpone allergy injections until you are better. Never get an allergy injection if your asthma is causing you problems. If your symptoms persist, seek out medical care to get your  health problem under control.  What If I am or Become Pregnant:  Women that become pregnant should schedule an appointment with [facility] before receiving any further allergy injections.

## 2023-03-26 NOTE — Progress Notes (Unsigned)
NEW PATIENT  Date of Service/Encounter:  03/26/23  Consult requested by: [name], MD   Assessment:   Moderate persistent asthma, uncomplicated - Plan: Spirometry with Graph  Seasonal and perennial allergic rhinitis  Plan/Recommendations:    Patient Instructions  1. Moderate persistent asthma, uncomplicated - Lung testing looks awesome today. - We are not going to make any changes at all.  - Daily controller medication(s): Advair 250/65mcg one puff twice daily - Prior to physical activity: albuterol 2 puffs 10-15 minutes before physical activity. - Rescue medications: albuterol 4 puffs every 4-6 hours as needed - Asthma control goals:  * Full participation in all desired activities (may need albuterol before activity) * Albuterol use two time or less a week on average (not counting use with activity) * Cough interfering with sleep two time or less a month * Oral steroids no more than once a year * No hospitalizations  2. Seasonal and perennial allergic rhinitis - Testing today showed: grasses, ragweed, weeds, indoor molds, dust mites, dog, and cockroach - Copy of test results provided.  - Avoidance measures provided. - Continue with: Astelin (azelastine) 2 sprays per nostril 1-2 times daily as needed - Start taking: Zyrtec (cetirizine) 10mg  tablet once daily - You can use an extra dose of the antihistamine, if needed, for breakthrough symptoms.  - Consider nasal saline rinses 1-2 times daily to remove allergens from the nasal cavities as well as help with mucous clearance (this is especially helpful to do before the nasal sprays are given) - We will start allergy shots as a means of long-term control. - Allergy shots "re-train" and "reset" the immune system to ignore environmental allergens and decrease the resulting immune response to those allergens (sneezing, itchy watery eyes, runny nose, nasal congestion, etc).    - Allergy shots improve symptoms in 75-85% of  patients.   3. Return in about 6 months (around 09/26/2023). You can have the follow up appointment with Dr. Dellis Anes or a Nurse Practicioner (our Nurse Practitioners are excellent and always have Physician oversight!).    Please inform us of any Emergency Department visits, hospitalizations, or changes in symptoms. Call us before going to the ED for breathing or allergy symptoms since we might be able to fit you in for a sick visit. Feel free to contact us anytime with any questions, problems, or concerns.  It was a pleasure to meet you today!  Websites that have reliable patient information: 1. American Academy of Asthma, Allergy, and Immunology: www.aaaai.org 2. Food Allergy Research and Education (FARE): foodallergy.org 3. Mothers of Asthmatics: http://www.asthmacommunitynetwork.org 4. American College of Allergy, Asthma, and Immunology: www.acaai.org   COVID-19 Vaccine Information can be found at: [email or web address] For questions related to vaccine distribution or appointments, please email [email or web address] or call [phone].   We realize that you might be concerned about having an allergic reaction to the COVID19 vaccines. To help with that concern, WE ARE OFFERING THE COVID19 VACCINES IN OUR OFFICE! Ask the front desk for dates!     "Like" [place] on Facebook and Instagram for our latest updates!      A healthy democracy works best when [employer] participate! Make sure you are registered to vote! If you have moved or changed any of your contact information, you will need to get this updated before voting!  In some cases, you MAY be able to register to vote online: [email or web address]       Airborne Adult Perc - 03/26/23 1541  Time Antigen Placed 1520    Allergen Manufacturer Greer    Location Back    Number of Test 55    1. Control-Buffer 50% Glycerol Negative    2.  Control-Histamine 3+    3. Bahia Negative    4. French Southern Territories Negative    5. Johnson Negative    6. Kentucky Blue Negative    7. Meadow Fescue Negative    8. Perennial Rye Negative    9. Timothy Negative    10. Ragweed Mix Negative    11. Cocklebur Negative    12. Plantain,  English Negative    13. Baccharis Negative    14. Dog Fennel Negative    15. Russian Thistle Negative    16. Lamb's Quarters 3+    17. Sheep Sorrell 3+    18. Rough Pigweed 3+    19. Marsh Elder, Rough Negative    20. Mugwort, Common 2+    21. Box, Elder Negative    22. Cedar, red Negative    23. Sweet Gum Negative    24. Pecan Pollen Negative    25. Pine Mix Negative    26. Walnut, Black Pollen Negative    27. Red Mulberry Negative    28. Ash Mix Negative    29. Birch Mix Negative    30. Beech American Negative    31. Cottonwood, [place] Negative    32. Hickory, White Negative    33. Maple Mix Negative    34. Oak, [place] Mix Negative    35. Sycamore Eastern Negative    36. Alternaria Alternata Negative    37. Cladosporium Herbarum Negative    38. Aspergillus Mix Negative    39. Penicillium Mix Negative    40. Bipolaris Sorokiniana (Helminthosporium) Negative    41. Drechslera Spicifera (Curvularia) Negative    42. Mucor Plumbeus Negative    43. Fusarium Moniliforme Negative    44. Aureobasidium Pullulans (pullulara) Negative    45. Rhizopus Oryzae Negative    46. Botrytis Cinera Negative    47. Epicoccum Nigrum Negative    48. Phoma Betae Negative    49. Dust Mite Mix 4+    50. Cat Hair 10,000 BAU/ml Negative    51.  Dog Epithelia Negative    52. Mixed Feathers Negative    53. Horse Epithelia Negative    54. Cockroach, German 4+    55. Tobacco Leaf Negative             Intradermal - 03/26/23 1542     Time Antigen Placed 1545    Allergen Manufacturer Waynette Buttery    Location Arm    Number of Test 13    Control Negative    Bahia 1+    French Southern Territories Negative    Johnson Negative    7 Grass  Negative    Ragweed Mix 3+    Tree Mix Negative    Mold 1 Negative    Mold 2 Negative    Mold 3 Negative    Mold 4 1+    Cat Negative    Dog 3+             Reducing Pollen Exposure  The American Academy of Allergy, Asthma and Immunology suggests the following steps to reduce your exposure to pollen during allergy seasons.    Do not hang sheets or clothing out to dry; pollen may collect on these items. Do not mow lawns or spend time around freshly cut grass; mowing stirs up pollen. Keep windows  closed at night.  Keep car windows closed while driving. Minimize morning activities outdoors, a time when pollen counts are usually at their highest. Stay indoors as much as possible when pollen counts or humidity is high and on windy days when pollen tends to remain in the air longer. Use air conditioning when possible.  Many air conditioners have filters that trap the pollen spores. Use a HEPA room air filter to remove pollen form the indoor air you breathe.  Control of Mold Allergen   Mold and fungi can grow on a variety of surfaces provided certain temperature and moisture conditions exist.  Outdoor molds grow on plants, decaying vegetation and soil.  The major outdoor mold, Alternaria and Cladosporium, are found in very high numbers during hot and dry conditions.  Generally, a late Summer - Fall peak is seen for common outdoor fungal spores.  Rain will temporarily lower outdoor mold spore count, but counts rise rapidly when the rainy period ends.  The most important indoor molds are Aspergillus and Penicillium.  Dark, humid and poorly ventilated basements are ideal sites for mold growth.  The next most common sites of mold growth are the bathroom and the kitchen.  Indoor (Perennial) Mold Control   Positive indoor molds via skin testing: Fusarium, Aureobasidium (Pullulara), and Rhizopus  Maintain humidity below 50%. Clean washable surfaces with 5% bleach solution. Remove sources e.g.  contaminated carpets.    Control of Dust Mite Allergen    Dust mites play a major role in allergic asthma and rhinitis.  They occur in environments with high humidity wherever human skin is found.  Dust mites absorb humidity from the atmosphere (ie, they do not drink) and feed on organic matter (including shed human and animal skin).  Dust mites are a microscopic type of insect that you cannot see with the naked eye.  High levels of dust mites have been detected from mattresses, pillows, carpets, upholstered furniture, bed covers, clothes, soft toys and any woven material.  The principal allergen of the dust mite is found in its feces.  A gram of dust may contain 1,000 mites and 250,000 fecal particles.  Mite antigen is easily measured in the air during house cleaning activities.  Dust mites do not bite and do not cause harm to humans, other than by triggering allergies/asthma.    Ways to decrease your exposure to dust mites in your home:  Encase mattresses, box springs and pillows with a mite-impermeable barrier or cover   Wash sheets, blankets and drapes weekly in hot water (130 F) with detergent and dry them in a dryer on the hot setting.  Have the room cleaned frequently with a vacuum cleaner and a damp dust-mop.  For carpeting or rugs, vacuuming with a vacuum cleaner equipped with a high-efficiency particulate air (HEPA) filter.  The dust mite allergic individual should not be in a room which is being cleaned and should wait 1 hour after cleaning before going into the room. Do not sleep on upholstered furniture (eg, couches).   If possible removing carpeting, upholstered furniture and drapery from the home is ideal.  Horizontal blinds should be eliminated in the rooms where the person spends the most time (bedroom, study, television room).  Washable vinyl, roller-type shades are optimal. Remove all non-washable stuffed toys from the bedroom.  Wash stuffed toys weekly like sheets and blankets  above.   Reduce indoor humidity to less than 50%.  Inexpensive humidity monitors can be purchased at most hardware stores.  Do not use a humidifier as can make the problem worse and are not recommended.  Control of Dog or Cat Allergen  Avoidance is the best way to manage a dog or cat allergy. If you have a dog or cat and are allergic to dog or cats, consider removing the dog or cat from the home. If you have a dog or cat but don't want to find it a new home, or if your family wants a pet even though someone in the household is allergic, here are some strategies that may help keep symptoms at bay:  Keep the pet out of your bedroom and restrict it to only a few rooms. Be advised that keeping the dog or cat in only one room will not limit the allergens to that room. Don't pet, hug or kiss the dog or cat; if you do, wash your hands with soap and water. High-efficiency particulate air (HEPA) cleaners run continuously in a bedroom or living room can reduce allergen levels over time. Regular use of a high-efficiency vacuum cleaner or a central vacuum can reduce allergen levels. Giving your dog or cat a bath at least once a week can reduce airborne allergen.  Control of Cockroach Allergen  Cockroach allergen has been identified as an important cause of acute attacks of asthma, especially in urban settings.  There are fifty-five species of cockroach that exist in the [place], however only three, the [place], [place] species produce allergen that can affect patients with Asthma.  Allergens can be obtained from fecal particles, egg casings and secretions from cockroaches.    Remove food sources. Reduce access to water. Seal access and entry points. Spray runways with 0.5-1% Diazinon or Chlorpyrifos Blow boric acid power under stoves and refrigerator. Place bait stations (hydramethylnon) at feeding sites.  Allergy Shots  Allergies are the result of a chain reaction that starts in  the immune system. Your immune system controls how your body defends itself. For instance, if you have an allergy to pollen, your immune system identifies pollen as an invader or allergen. Your immune system overreacts by producing antibodies called Immunoglobulin E (IgE). These antibodies travel to cells that release chemicals, causing an allergic reaction.  The concept behind allergy immunotherapy, whether it is received in the form of shots or tablets, is that the immune system can be desensitized to specific allergens that trigger allergy symptoms. Although it requires time and patience, the payback can be long-term relief. Allergy injections contain a dilute solution of those substances that you are allergic to based upon your skin testing and allergy history.   How Do Allergy Shots Work?  Allergy shots work much like a vaccine. Your body responds to injected amounts of a particular allergen given in increasing doses, eventually developing a resistance and tolerance to it. Allergy shots can lead to decreased, minimal or no allergy symptoms.  There generally are two phases: build-up and maintenance. Build-up often ranges from three to six months and involves receiving injections with increasing amounts of the allergens. The shots are typically given once or twice a week, though more rapid build-up schedules are sometimes used.  The maintenance phase begins when the most effective dose is reached. This dose is different for each person, depending on how allergic you are and your response to the build-up injections. Once the maintenance dose is reached, there are longer periods between injections, typically two to four weeks.  Occasionally doctors give cortisone-type shots that can temporarily reduce allergy symptoms.  These types of shots are different and should not be confused with allergy immunotherapy shots.  Who Can Be Treated with Allergy Shots?  Allergy shots may be a good treatment approach  for people with allergic rhinitis (hay fever), allergic asthma, conjunctivitis (eye allergy) or stinging insect allergy.   Before deciding to begin allergy shots, you should consider:  . The length of allergy season and the severity of your symptoms . Whether medications and/or changes to your environment can control your symptoms . Your desire to avoid long-term medication use . Time: allergy immunotherapy requires a major time commitment . Cost: may vary depending on your insurance coverage  Allergy shots for children age 27 and older are effective and often well tolerated. They might prevent the onset of new allergen sensitivities or the progression to asthma.  Allergy shots are not started on patients who are pregnant but can be continued on patients who become pregnant while receiving them. In some patients with other medical conditions or who take certain common medications, allergy shots may be of risk. It is important to mention other medications you talk to your allergist.   What are the two types of build-ups offered:   RUSH or Rapid Desensitization -- one day of injections lasting from 8:30-4:30pm, injections every 1 hour.  Approximately half of the build-up process is completed in that one day.  The following week, normal build-up is resumed, and this entails ~16 visits either weekly or twice weekly, until reaching your "maintenance dose" which is continued weekly until eventually getting spaced out to every month for a duration of 3 to 5 years. The regular build-up appointments are nurse visits where the injections are administered, followed by required monitoring for 30 minutes.    Traditional build-up -- weekly visits for 6 -12 months until reaching "maintenance dose", then continue weekly until eventually spacing out to every 4 weeks as above. At these appointments, the injections are administered, followed by required monitoring for 30 minutes.     Either way is acceptable,  and both are equally effective. With the rush protocol, the advantage is that less time is spent here for injections overall AND you would also reach maintenance dosing faster (which is when the clinical benefit starts to become more apparent). Not everyone is a candidate for rapid desensitization.   IF we proceed with the RUSH protocol, there are premedications which must be taken the day before and the day after the rush only (this includes antihistamines, steroids, and Singulair).  After the rush day, no prednisone or Singulair is required, and we just recommend antihistamines taken on your injection day.  What Is An Estimate of the Costs?  If you are interested in starting allergy injections, please check with your insurance company about your coverage for both allergy vial sets and allergy injections.  Please do so prior to making the appointment to start injections.  The following are CPT codes to give to your insurance company. These are the amounts we BILL to the insurance company, but the amount YOU WILL PAY and WE RECEIVE IS SUBSTANTIALLY LESS and depends on the contracts we have with different insurance companies.   Amount Billed to Insurance One allergy vial set  CPT 95165   $ 1200     Two allergy vial set  CPT 95165   $ 2400     Three allergy vial set  CPT 95165   $ 3600     One injection   CPT 95115   $  35  Two injections   CPT 95117   $ 40 RUSH (Rapid Desensitization) CPT 95180 x 8 hours $500/hour  Regarding the allergy injections, your co-pay may or may not apply with each injection, so please confirm this with your insurance company. When you start allergy injections, 1 or 2 sets of vials are made based on your allergies.  Not all patients can be on one set of vials. A set of vials lasts 6 months to a year depending on how quickly you can proceed with your build-up of your allergy injections. Vials are personalized for each patient depending on their specific allergens.  How often  are allergy injection given during the build-up period?   Injections are given at least weekly during the build-up period until your maintenance dose is achieved. Per the doctor's discretion, you may have the option of getting allergy injections two times per week during the build-up period. However, there must be at least 48 hours between injections. The build-up period is usually completed within 6-12 months depending on your ability to schedule injections and for adjustments for reactions. When maintenance dose is reached, your injection schedule is gradually changed to every two weeks and later to every three weeks. Injections will then continue every 4 weeks. Usually, injections are continued for a total of 3-5 years.   When Will I Feel Better?  Some may experience decreased allergy symptoms during the build-up phase. For others, it may take as long as 12 months on the maintenance dose. If there is no improvement after a year of maintenance, your allergist will discuss other treatment options with you.  If you aren't responding to allergy shots, it may be because there is not enough dose of the allergen in your vaccine or there are missing allergens that were not identified during your allergy testing. Other reasons could be that there are high levels of the allergen in your environment or major exposure to non-allergic triggers like tobacco smoke.  What Is the Length of Treatment?  Once the maintenance dose is reached, allergy shots are generally continued for three to five years. The decision to stop should be discussed with your allergist at that time. Some people may experience a permanent reduction of allergy symptoms. Others may relapse and a longer course of allergy shots can be considered.  What Are the Possible Reactions?  The two types of adverse reactions that can occur with allergy shots are local and systemic. Common local reactions include very mild redness and swelling at the  injection site, which can happen immediately or several hours after. Report a delayed reaction from your last injection. These include arm swelling or runny nose, watery eyes or cough that occurs within 12-24 hours after injection. A systemic reaction, which is less common, affects the entire body or a particular body system. They are usually mild and typically respond quickly to medications. Signs include increased allergy symptoms such as sneezing, a stuffy nose or hives.   Rarely, a serious systemic reaction called anaphylaxis can develop. Symptoms include swelling in the throat, wheezing, a feeling of tightness in the chest, nausea or dizziness. Most serious systemic reactions develop within 30 minutes of allergy shots. This is why it is strongly recommended you wait in your doctor's office for 30 minutes after your injections. Your allergist is trained to watch for reactions, and his or her staff is trained and equipped with the proper medications to identify and treat them.   Report to the nurse immediately if  you experience any of the following symptoms: swelling, itching or redness of the skin, hives, watery eyes/nose, breathing difficulty, excessive sneezing, coughing, stomach pain, diarrhea, or light headedness. These symptoms may occur within 15-20 minutes after injection and may require medication.   Who Should Administer Allergy Shots?  The preferred location for receiving shots is your prescribing allergist's office. Injections can sometimes be given at another facility where the physician and staff are trained to recognize and treat reactions, and have received instructions by your prescribing allergist.  What if I am late for an injection?   Injection dose will be adjusted depending upon how many days or weeks you are late for your injection.   What if I am sick?   Please report any illness to the nurse before receiving injections. She may adjust your dose or postpone injections  depending on your symptoms. If you have fever, flu, sinus infection or chest congestion it is best to postpone allergy injections until you are better. Never get an allergy injection if your asthma is causing you problems. If your symptoms persist, seek out medical care to get your health problem under control.  What If I am or Become Pregnant:  Women that become pregnant should schedule an appointment with [facility] before receiving any further allergy injections.           {Blank single:19197::"This note in its entirety was forwarded to the Provider who requested this consultation."}  Subjective:   Mary Pratt is a 52 y.o. adult presenting today for evaluation of  Chief Complaint  Patient presents with  . Allergic Rhinitis     Used to go to [facility] and receive allergy injections. Says she had bad allergies.   . Asthma    Says it is controlled. Uses Advair 250/50 one puff bid    ANAH BILLARD has a history of the following: Patient Active Problem List   Diagnosis Date Noted  . 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 Diarrhea 08/21/2022  . Right sided abdominal pain 08/21/2022  . Chest pain with moderate risk for cardiac etiology 06/24/2021  . 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 Fatigue 02/16/2017  . Chronic systolic heart failure (HCC) 06/12/2015  . Nonischemic cardiomyopathy (HCC) 02/27/2015  . Essential hypertension 02/27/2015  . Cardiomyopathy (HCC)   . Acute systolic heart failure (HCC) 02/22/2015  . Functional dyspepsia 01/23/2011  . Asthma 12/28/2007  . GERD with stricture 12/28/2007    History obtained from: chart review and {Persons; PED relatives w/patient:19415::"patient"}.  Mary Pratt was referred by [name], MD.     Mary Pratt is a 52 y.o. adult presenting for {Blank single:19197::"a food challenge","a drug challenge","skin testing","a sick visit","an evaluation of  ***","a follow up visit"}.  She was followed by [facility] and she was seeing Dr. Tempie Donning. She was seeing him for quite some time.     Asthma/Respiratory Symptom History: She is on Advair 250/14mcg one puff BID. She is having trouble with insurance approving it. She was givne sometime at one point to try, but it "didn't agree" with her. She uses her rescue inhaler once or twice per month.  A flare happens 1-2 times per year.   Allergic Rhinitis Symptom History: She was on allergy shots with [facility]. She was on shots for 5 years or so. She is not sure how long she was going there ad how often she was getting them. She does report that they were helping  her symptoms. Her worst time of the year is year round. Then she went to ENT and she was diagnosed with    CT scan from November 2023 showed trace mucosal thickening of the frontal ethmoidal recesses and bilateral maxillary sinuses with patent OMCs   Nasal endoscopy performed November 2023 also showed slight right septal deviation with a moderate-sized to large sized bony spur causing narrowing of the right nasal cavity no polyps noted due to bony spurring of the septum to the right as well as significant bilateral inferior turbinate hypertrophy it was recommended that patient may potentially in the future consider surgical intervention.   {Blank single:19197::"Skin Symptom History: ***"," "}  {Blank single:19197::"GERD Symptom History: ***"," "}  ***Otherwise, there is no history of other atopic diseases, including {Blank multiple:19196:o:"asthma","food allergies","drug allergies","environmental allergies","stinging insect allergies","eczema","urticaria","contact dermatitis"}. There is no significant infectious history. ***Vaccinations are up to date.    Past Medical History: Patient Active Problem List   Diagnosis Date Noted  . 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 Diarrhea 08/21/2022  . Right sided abdominal pain  08/21/2022  . Chest pain with moderate risk for cardiac etiology 06/24/2021  . 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 Fatigue 02/16/2017  . Chronic systolic heart failure (HCC) 06/12/2015  . Nonischemic cardiomyopathy (HCC) 02/27/2015  . Essential hypertension 02/27/2015  . Cardiomyopathy (HCC)   . Acute systolic heart failure (HCC) 02/22/2015  . Functional dyspepsia 01/23/2011  . Asthma 12/28/2007  . GERD with stricture 12/28/2007    Medication List:  Allergies as of 03/26/2023       Reactions   Clarithromycin Hives   Amoxicillin Palpitations   REACTION: Tacycardia   Meloxicam Rash   Spiriva Respimat [tiotropium Bromide Monohydrate] Rash   Symbicort [budesonide-formoterol Fumarate] Rash        Medication List        Accurate as of March 26, 2023  4:40 PM. If you have any questions, ask your nurse or doctor.          STOP taking these medications    dicyclomine 20 MG tablet Commonly known as: BENTYL Stopped by: Alfonse Spruce       TAKE these medications    albuterol 108 (90 Base) MCG/ACT inhaler Commonly known as: VENTOLIN HFA Inhale 2 puffs into the lungs every 6 (six) hours as needed for wheezing or shortness of breath.   Azelastine HCl 137 MCG/SPRAY Soln Place into both nostrils.   carvedilol 25 MG tablet Commonly known as: COREG TAKE 1 TABLET (25 MG TOTAL) BY MOUTH TWICE A DAY WITH MEALS What changed: See the new instructions.   Emgality 120 MG/ML Soaj Generic drug: Galcanezumab-gnlm Inject 120 mg into the skin every 28 (twenty-eight) days.   Entresto 24-26 MG Generic drug: sacubitril-valsartan TAKE 1 TABLET BY MOUTH TWICE A DAY   fluocinonide ointment 0.05 % Commonly known as: LIDEX Apply 1 Application topically daily as needed (eczema).   Fluticasone-Salmeterol 250-50 MCG/DOSE Aepb Commonly known as: Advair Diskus Inhale 1 puff into the lungs 2 (two) times daily.   furosemide 20 MG  tablet Commonly known as: LASIX TAKE 1 TABLET BY MOUTH EVERY DAY   metroNIDAZOLE 500 MG tablet Commonly known as: FLAGYL Take 1 tablet (500 mg total) by mouth 2 (two) times daily.   nitroGLYCERIN 0.4 MG SL tablet Commonly known as: NITROSTAT Place 1 tablet (0.4 mg total) under the tongue every 5 (five) minutes x 3 doses as needed for chest pain.   pantoprazole 40 MG tablet  Commonly known as: PROTONIX Take 40 mg by mouth daily.   sertraline 100 MG tablet Commonly known as: ZOLOFT Take 100 mg by mouth daily.   spironolactone 25 MG tablet Commonly known as: ALDACTONE TAKE 1 TABLET (25 MG TOTAL) BY MOUTH DAILY.   sucralfate 1 g tablet Commonly known as: Carafate Take 1 tablet (1 g total) by mouth 4 (four) times daily as needed.   traZODone 50 MG tablet Commonly known as: DESYREL Take 100 mg by mouth at bedtime as needed.        Birth History: {Blank single:19197::"non-contributory","born premature and spent time in the NICU","born at term without complications"}  Developmental History: Lataja has met all milestones on time. She has required no {Blank multiple:19196:a:"speech therapy","occupational therapy","physical therapy"}. ***non-contributory  Past Surgical History: Past Surgical History:  Procedure Laterality Date  . BIV ICD GENERATOR CHANGEOUT N/A 01/24/2021   Procedure: BIV ICD GENERATOR CHANGEOUT;  Surgeon: Duke Salvia, MD;  Location: [facility] INVASIVE CV LAB;  Service: Cardiovascular;  Laterality: N/A;  . CARDIAC CATHETERIZATION N/A 02/26/2015   Procedure: Right/Left Heart Cath and Coronary Angiography;  Surgeon: Lennette Bihari, MD;  Location: [facility] INVASIVE CV LAB;  Service: Cardiovascular;  Laterality: N/A;  . COLPOSCOPY  09/08/2011   neg  . EP IMPLANTABLE DEVICE N/A 10/14/2015   Procedure: BiV ICD Insertion CRT-D;  Surgeon: Duke Salvia, MD;  Location: [facility] INVASIVE CV LAB;  Service: Cardiovascular;  Laterality: N/A;  . ESOPHAGOGASTRODUODENOSCOPY (EGD) WITH  ESOPHAGEAL DILATION  11/30/2008   esophageal ring dilated 54 French, H. pylori gastritis  . HARVEST BONE GRAFT  02/2023  . TUBAL LIGATION  09/07/1989  . WISDOM TOOTH EXTRACTION    . WRIST SURGERY  2023     Family History: Family History  Problem Relation Age of Onset  . Diabetes Mother   . Hypertension Mother   . Hypothyroidism Mother   . Breast cancer Maternal Aunt   . Brain cancer Maternal Uncle   . Asthma Paternal Aunt   . Prostate cancer Paternal Uncle   . Lung cancer Maternal Grandmother   . Eczema Daughter   . Allergic rhinitis Daughter   . Colon cancer Neg Hx   . Colon polyps Neg Hx   . Esophageal cancer Neg Hx      Social History: Anayiah lives at home with ***.    Review of systems otherwise negative other than that mentioned in the HPI.    Objective:   Blood pressure 114/86, pulse 100, temperature 98.2 F (36.8 C), temperature source Temporal, resp. rate 14, height 5' 0.63" (1.54 m), weight 158 lb (71.7 kg), last menstrual period 09/14/2022, SpO2 95%. Body mass index is 30.22 kg/m.     Physical Exam   Diagnostic studies:    Spirometry: results normal (FEV1: 2.00/100%, FVC: 2.59/104%, FEV1/FVC: 77%).    Spirometry consistent with normal pattern. {Blank single:19197::"Albuterol/Atrovent nebulizer","Xopenex/Atrovent nebulizer","Albuterol nebulizer","Albuterol four puffs via MDI","Xopenex four puffs via MDI"} treatment given in clinic with {Blank single:19197::"significant improvement in FEV1 per ATS criteria","significant improvement in FVC per ATS criteria","significant improvement in FEV1 and FVC per ATS criteria","improvement in FEV1, but not significant per ATS criteria","improvement in FVC, but not significant per ATS criteria","improvement in FEV1 and FVC, but not significant per ATS criteria","no improvement"}.  Allergy Studies: {Blank single:19197::"none","labs sent instead"," "}   Airborne Adult Perc - 03/26/23 1541     Time Antigen Placed 1520     Allergen Manufacturer Waynette Buttery    Location Back    Number of Test 55  1. Control-Buffer 50% Glycerol Negative    2. Control-Histamine 3+    3. Bahia Negative    4. French Southern Territories Negative    5. Johnson Negative    6. Kentucky Blue Negative    7. Meadow Fescue Negative    8. Perennial Rye Negative    9. Timothy Negative    10. Ragweed Mix Negative    11. Cocklebur Negative    12. Plantain,  English Negative    13. Baccharis Negative    14. Dog Fennel Negative    15. Russian Thistle Negative    16. Lamb's Quarters 3+    17. Sheep Sorrell 3+    18. Rough Pigweed 3+    19. Marsh Elder, Rough Negative    20. Mugwort, Common 2+    21. Box, Elder Negative    22. Cedar, red Negative    23. Sweet Gum Negative    24. Pecan Pollen Negative    25. Pine Mix Negative    26. Walnut, Black Pollen Negative    27. Red Mulberry Negative    28. Ash Mix Negative    29. Birch Mix Negative    30. Beech American Negative    31. Cottonwood, Guinea-Bissau Negative    32. Hickory, White Negative    33. Maple Mix Negative    34. Oak, Guinea-[place]iss[place] Mix Negative    35. Sycamore Eastern Negative    36. Alternaria Alternata Negative    37. Cladosporium Herbarum Negative    38. Aspergillus Mix Negative    39. Penicillium Mix Negative    40. Bipolaris Sorokiniana (Helminthosporium) Negative    41. Drechslera Spicifera (Curvularia) Negative    42. Mucor Plumbeus Negative    43. Fusarium Moniliforme Negative    44. Aureobasidium Pullulans (pullulara) Negative    45. Rhizopus Oryzae Negative    46. Botrytis Cinera Negative    47. Epicoccum Nigrum Negative    48. Phoma Betae Negative    49. Dust Mite Mix 4+    50. Cat Hair 10,000 BAU/ml Negative    51.  Dog Epithelia Negative    52. Mixed Feathers Negative    53. Horse Epithelia Negative    54. Cockroach, German 4+    55. Tobacco Leaf Negative             Intradermal - 03/26/23 1542     Time Antigen Placed 1545    Allergen Manufacturer Waynette Buttery    Location  Arm    Number of Test 13    Control Negative    Bahia 1+    French Southern Territories Negative    Johnson Negative    7 Grass Negative    Ragweed Mix 3+    Tree Mix Negative    Mold 1 Negative    Mold 2 Negative    Mold 3 Negative    Mold 4 1+    Cat Negative    Dog 3+             {Blank single:19197::"Allergy testing results were read and interpreted by myself, documented by clinical staff."," "}         Malachi Bonds, MD [facility]

## 2023-03-29 ENCOUNTER — Encounter: Payer: Self-pay | Admitting: Allergy & Immunology

## 2023-03-29 DIAGNOSIS — J3081 Allergic rhinitis due to animal (cat) (dog) hair and dander: Secondary | ICD-10-CM | POA: Diagnosis not present

## 2023-03-29 NOTE — Progress Notes (Signed)
Aeroallergen Immunotherapy  Ordering Provider: Dr. Malachi Bonds  Patient Details Name: Mary Pratt MRN: [id number] Date of Birth: 08/05/1971  Order 1 of 2  Vial Label: G/W/D  0.2 ml (Volume)  1:20 Concentration -- Bahia 0.6 ml (Volume)  1:20 Concentration -- Ragweed Mix 0.7 ml (Volume)  1:20 Concentration -- Weed Mix* 0.5 ml (Volume)  1:10 Concentration -- Dog Epithelia   2.0  ml Extract Subtotal 3.0  ml Diluent 5.0  ml Maintenance Total  Schedule:  B  Blue Vial (1:100,000): Schedule B (6 doses) Yellow Vial (1:10,000): Schedule B (6 doses) Green Vial (1:1,000): Schedule B (6 doses) Red Vial (1:100): Schedule A (14 doses)  Special Instructions: After completion of the first Red Vial, please space to every two weeks. After completion of the second Red Vial, please space to every 4 weeks. Ok to up dose new vials at 0.51mL --> 0.3 mL --> 0.5 mL. Ok to come twice weekly, if desired, as long as there is 48 hours between injections.

## 2023-03-29 NOTE — Progress Notes (Signed)
Aeroallergen Immunotherapy  Ordering Provider: Dr. Malachi Bonds  Patient Details Name: Mary Pratt MRN: [id number] Date of Birth: [birth year]  Order 2 of 2  Vial Label: DM/CR/Molds  0.2 ml (Volume)  1:10 Concentration -- Fusarium moniliforme 0.2 ml (Volume)  1:40 Concentration -- Aureobasidium pullulans 0.2 ml (Volume)  1:10 Concentration -- Rhizopus oryzae 0.3 ml (Volume)  1:20 Concentration -- Cockroach, German 0.8 ml (Volume)   AU Concentration -- Mite Mix (DF 5,000 & DP 5,000)   1.7  ml Extract Subtotal 3.3  ml Diluent 5.0  ml Maintenance Total  Schedule:  B  Blue Vial (1:100,000): Schedule B (6 doses) Yellow Vial (1:10,000): Schedule B (6 doses) Green Vial (1:1,000): Schedule B (6 doses) Red Vial (1:100): Schedule A (14 doses)  Special Instructions: After completion of the first Red Vial, please space to every two weeks. After completion of the second Red Vial, please space to every 4 weeks. Ok to up dose new vials at 0.11mL --> 0.3 mL --> 0.5 mL. Ok to come twice weekly, if desired, as long as there is 48 hours between injections.

## 2023-03-29 NOTE — Progress Notes (Addendum)
VIALS EXP 03-28-24

## 2023-03-30 DIAGNOSIS — J3089 Other allergic rhinitis: Secondary | ICD-10-CM | POA: Diagnosis not present

## 2023-03-30 NOTE — Progress Notes (Deleted)
NEUROLOGY FOLLOW UP OFFICE NOTE  Mary Pratt [id number]  Assessment/Plan:   Dyesthesias/paresthesias - unclear etiology.  Distribution does not correlate with a polyneuropathy - seems more likely to be environmental or related to vitamin deficiency.  At this time, she has B12 deficiency which may be the culprit.   Also with borderline low B1 level Chronic migraine without aura, without status migrainosus, not intractable   Continue B12 shots.  Consider taking thiamine supplement.  Re-evaluate at follow up For migraine prevention, will start Emgality.  Already has been on beta blocker and had potential adverse reaction to topiramate. Follow up on heavy metal screen Follow up 4 months.  Subjective:  Mary Pratt is a 52 year old female with CHF, asthma, cardiac defibrillator, depression and anxiety who follows up for migraines and dysesthesias/paresthesias.  UPDATE: Migraines: CT head on 08/19/2022 was unremarkable.  Started Emgality *** ***  Dysesthesias/paresthesias: On B12 and B1? ***  Current NSAIDS/analgesics:  Tylenol Current triptans:  none Current ergotamine:  none Current anti-emetic:  none Current muscle relaxants:  baclofen 10mg  Current Antihypertensive medications:  carvedilol Current Antidepressant medications:  sertraline 100mg  BID Current Anticonvulsant medications:  none Current anti-CGRP:  Emgality Current Antihistamines/Decongestants:  Allegra, Astelin, Flonase *** Other therapy:  none Birth control:  none       Caffeine:  1 cup coffee daily.  Sometimes tea.  No soda Depression:  yes; Anxiety:  yes Sleep hygiene:  varies.  Sleeps during day.  Fatigued.  She was checked for sleep apnea which was negative.    HISTORY: Migraines: She has longstanding history of recurrent sinusitis and allergic rhinitis.  She has undergone sinus surgery in the past.  For years, she has been experiencing congestion in the nose, head and ears.  She takes Allegra  daily and sometimes Flonase or Astelin spray.  Since her 10s, she has had daily headaches which she has always attributed to her sinuses.  They are mild to moderate mid-frontal throbbing pain and head pressure, sometimes with nausea, photophobia and phonophobia.  Has ear aches.  Worse in the morning.  They last 2-3 hours.  She will treat with her allergy medications which help, but they will return later in the day.  She sometimes treats headaches with Tylenol, but only once a week.  Sometimes her right shoulder feels stiff.  Reports some blurred vision.  Evaluated by the eye doctor.  Exam was fine.  She also reports excessive daytime somnolence and will sleep during the day.  Sleep at night is variable.  She has previously been worked up for sleep apnea, which was negative.  She saw ENT this month.  CT sinuses on 07/28/2022 showed trace mucosal thickening but patent sinuses and only mild right deviated septum.     She does have history of migraines as a child, which would cause her to lay in a dark and quiet room.       Past NSAIDS/analgesics:  Fioricet Past abortive triptans:  none Past abortive ergotamine:  none Past muscle relaxants:  none Past anti-emetic:  none Past antihypertensive medications:  furosemide Past antidepressant medications:  duloxetine Past anticonvulsant medications:  topiramate (paresthesias) Past anti-CGRP:  none  Family history of headache:  No  Dysesthesias/paresthesias: In late February 2024, she started experiencing numbness, tingling and burning sensation in the right hand, feet, scalp and torso.  Also she noted her palms looked orange.  She had carpal tunnel surgery in September 2023 on the left.  It did not help.  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  She was advised to discontinue topiramate.  No improvement.  Labs on 11/18/2022 revealed low B12 of 167 and B1 of 6 but other labs  including ANA, ENA, IFE, Lyme, ACE 32, TSH 0.41, B6 6.6, and folate 12 were unremarkable.  Heavy metal screen negative.  NCV-EMG of right upper and lower extremities on 12/03/2022 was normal.  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    PAST MEDICAL HISTORY: Past Medical History:  Diagnosis Date   Abnormal pap 05/2012   ASCUS + HPV, 11/14 LGSIL   Anxiety    Asthma    Cardiac defibrillator in place    Chronic systolic CHF (congestive heart failure) (HCC)    a. 02/2015 Echo: EF 20%, sev dil w/ diff HK, worse @ inf base. Tiv AI, mild MR, mod dil LA, PASP .   Functional dyspepsia    early satiety    GERD with stricture    dilated 2010   HELICOBACTER PYLORI GASTRITIS 01/15/2009   dyspnea, ? reaction to Biaxin/amoxicillin  Pylera 01/15/09 - completed therapy      IBS (irritable bowel syndrome)    NICM (nonischemic cardiomyopathy) (HCC)    a. 02/2015 Cath: nl cors, EF 15%, mild PAH;  b. 02/2015 Enrolled in VEST trial.   Sickle cell trait (HCC)    Situational depression    "son died"   Vitamin B 12 deficiency 11/2022    MEDICATIONS: Current Outpatient Medications on File Prior to Visit  Medication Sig Dispense Refill   albuterol (PROVENTIL HFA;VENTOLIN HFA) 108 (90 BASE) MCG/ACT inhaler Inhale 2 puffs into the lungs every 6 (six) hours as needed for wheezing or shortness of breath.     Azelastine HCl 137 MCG/SPRAY SOLN Place into both nostrils.     carvedilol (COREG) 25 MG tablet TAKE 1 TABLET (25 MG TOTAL) BY MOUTH TWICE A DAY WITH MEALS (Patient taking differently: Take 25 mg by mouth 2 (two) times daily with a meal.) 180 tablet 1   ENTRESTO 24-26 MG TAKE 1 TABLET BY MOUTH TWICE A DAY 60 tablet 6   fluocinonide ointment (LIDEX) 0.05 % Apply 1 Application topically daily as needed (eczema).     Fluticasone-Salmeterol (ADVAIR DISKUS) 250-50 MCG/DOSE AEPB Inhale 1 puff into the  lungs 2 (two) times daily. 180 each 4   furosemide (LASIX) 20 MG tablet TAKE 1 TABLET BY MOUTH EVERY DAY 90 tablet 1   Galcanezumab-gnlm (EMGALITY) 120 MG/ML SOAJ Inject 120 mg into the skin every 28 (twenty-eight) days. 1.12 mL 11   metroNIDAZOLE (FLAGYL) 500 MG tablet Take 1 tablet (500 mg total) by mouth 2 (two) times daily. 14 tablet 0   nitroGLYCERIN (NITROSTAT) 0.4 MG SL tablet Place 1 tablet (0.4 mg total) under the tongue every 5 (five) minutes x 3 doses as needed for chest pain. 25 tablet 12   pantoprazole (PROTONIX) 40 MG tablet Take 40 mg by mouth daily.     sertraline (ZOLOFT) 100 MG tablet Take 100 mg by mouth daily.     spironolactone (ALDACTONE) 25 MG tablet TAKE 1 TABLET (25 MG TOTAL) BY MOUTH DAILY. 90 tablet 1   sucralfate (CARAFATE) 1 g tablet Take 1 tablet (1 g total) by mouth 4 (four) times daily as needed. 30 tablet 0   traZODone (  DESYREL) 50 MG tablet Take 100 mg by mouth at bedtime as needed.     No current facility-administered medications on file prior to visit.    ALLERGIES: Allergies  Allergen Reactions   Clarithromycin Hives   Amoxicillin Palpitations    REACTION: Tacycardia   Meloxicam Rash   Spiriva Respimat [Tiotropium Bromide Monohydrate] Rash   Symbicort [Budesonide-Formoterol Fumarate] Rash    FAMILY HISTORY: Family History  Problem Relation Age of Onset   Diabetes Mother    Hypertension Mother    Hypothyroidism Mother    Breast cancer Maternal Aunt    Brain cancer Maternal Uncle    Asthma Paternal Aunt    Prostate cancer Paternal Uncle    Lung cancer Maternal Grandmother    Eczema Daughter    Allergic rhinitis Daughter    Colon cancer Neg Hx    Colon polyps Neg Hx    Esophageal cancer Neg Hx       Objective:  Blood pressure 125/86, pulse 78, height 5\' 1"  (1.549 m), weight 155 lb 12.8 oz (70.7 kg), last menstrual period 09/14/2022, SpO2 96 %. General: No acute distress.  Patient appears well-groomed.   Head:   Normocephalic/atraumatic Eyes:  Fundi examined but not visualized Neck: supple, no paraspinal tenderness, full range of motion Heart:  Regular rate and rhythm Neurological Exam: alert and oriented.  Speech fluent and not dysarthric, language intact.  CN II-XII intact. Bulk and tone normal, muscle strength 5/5 throughout.  Sensation to pinprick reduced in first 3 digits of both hands, reduced vibratory sensation in fingers bilaterally. Deep tendon reflexes 3+ in patellars but otherwise 2+ throughout, Hoffman sign absent, Babinski sign absent.  Finger to nose testing intact.  Gait normal, Romberg negative.   Shon Millet, DO  CC: Donzetta Sprung, MD

## 2023-03-31 ENCOUNTER — Encounter: Payer: Self-pay | Admitting: Neurology

## 2023-03-31 ENCOUNTER — Ambulatory Visit: Payer: BC Managed Care – PPO | Admitting: Neurology

## 2023-04-01 NOTE — Addendum Note (Signed)
Addended by: Dollene Cleveland R on: 04/01/2023 12:28 PM   Modules accepted: Orders

## 2023-04-07 ENCOUNTER — Encounter: Payer: Self-pay | Admitting: Internal Medicine

## 2023-04-07 ENCOUNTER — Telehealth: Payer: Self-pay

## 2023-04-07 NOTE — Telephone Encounter (Signed)
LVM for patient to call device clinic back.No significant episodes noted.    Patient  did have some VSE events markers appear to suggest intrinsic competing rhythm   LV output 3.75V@1 .00ms possible diaphragmatic stim? Although LV threshold of 3.5V@1 .00ms is stable for patient.

## 2023-04-07 NOTE — Telephone Encounter (Signed)
Patient stated that she feels this when she moves certain ways, patient agreeable to appointment tomorrow at 1:30 in device clinic to assess for  diaphragmatic stimulation

## 2023-04-08 ENCOUNTER — Ambulatory Visit: Payer: BC Managed Care – PPO

## 2023-04-08 ENCOUNTER — Other Ambulatory Visit: Payer: BC Managed Care – PPO

## 2023-04-08 ENCOUNTER — Other Ambulatory Visit: Payer: Medicaid Other

## 2023-04-08 ENCOUNTER — Other Ambulatory Visit: Payer: Medicaid Other | Admitting: Radiology

## 2023-04-08 ENCOUNTER — Telehealth: Payer: Self-pay | Admitting: Radiology

## 2023-04-08 DIAGNOSIS — N950 Postmenopausal bleeding: Secondary | ICD-10-CM

## 2023-04-08 DIAGNOSIS — R1031 Right lower quadrant pain: Secondary | ICD-10-CM

## 2023-04-08 NOTE — Telephone Encounter (Signed)
Call placed to patient. Left detailed message, ok per dpr. Advised new order placed to be scheduled at outside imaging facility. Call [phone] to schedule. Return call to office if any additional questions or assistance needed.   Previous order cancelled.   Routing to provider for final review. Patient is agreeable to disposition. Will close encounter.

## 2023-04-13 ENCOUNTER — Ambulatory Visit (HOSPITAL_COMMUNITY)
Admission: RE | Admit: 2023-04-13 | Discharge: 2023-04-13 | Disposition: A | Discharge status: Home / Self Care | Payer: BC Managed Care – PPO | Source: Ambulatory Visit | Attending: Neurology | Admitting: Neurology

## 2023-04-13 DIAGNOSIS — R200 Anesthesia of skin: Secondary | ICD-10-CM | POA: Insufficient documentation

## 2023-04-13 MED ORDER — GADOBUTROL 1 MMOL/ML IV SOLN: 7.0000 mL | Freq: Once | INTRAVENOUS | Status: DC | PRN

## 2023-04-13 MED ORDER — GADOBUTROL 1 MMOL/ML IV SOLN
7.0000 mL | Freq: Once | INTRAVENOUS | Status: AC | PRN
  Administered 2023-04-13: 7 mL via INTRAVENOUS

## 2023-04-19 ENCOUNTER — Ambulatory Visit: Payer: BC Managed Care – PPO

## 2023-04-20 ENCOUNTER — Ambulatory Visit (HOSPITAL_COMMUNITY)
Admission: RE | Admit: 2023-04-20 | Discharge: 2023-04-20 | Disposition: A | Discharge status: Home / Self Care | Payer: BC Managed Care – PPO | Source: Ambulatory Visit | Attending: Radiology | Admitting: Radiology

## 2023-04-20 DIAGNOSIS — N950 Postmenopausal bleeding: Secondary | ICD-10-CM | POA: Insufficient documentation

## 2023-04-20 DIAGNOSIS — R1031 Right lower quadrant pain: Secondary | ICD-10-CM | POA: Diagnosis present

## 2023-04-21 ENCOUNTER — Encounter: Payer: Self-pay | Admitting: Cardiology

## 2023-04-21 ENCOUNTER — Telehealth: Payer: Self-pay | Admitting: Cardiology

## 2023-04-21 ENCOUNTER — Encounter: Payer: Self-pay | Admitting: Neurology

## 2023-04-21 NOTE — Telephone Encounter (Signed)
   Pre-operative Risk Assessment    Patient Name: Mary Pratt  DOB: 11/13/1970 MRN: [id number]      Request for Surgical Clearance    Procedure: FCE - with physical therapist   Date of Surgery:  Clearance TBD                                 Surgeon:  Raechel [facility]  Surgeon's Group or Practice Name:  n:  [facility]  Phone number:  [phone] Fax number:  [phone]   Type of Clearance Requested:   - Medical    Type of Anesthesia:  Not Indicated   Additional requests/questions:    Sallyanne Havers   04/21/2023, 4:51 PM

## 2023-04-21 NOTE — Progress Notes (Signed)
Patient advised of Results.

## 2023-04-22 ENCOUNTER — Telehealth: Payer: Self-pay | Admitting: *Deleted

## 2023-04-22 NOTE — Telephone Encounter (Signed)
Pt is returning call.  

## 2023-04-22 NOTE — Telephone Encounter (Signed)
Left message to call back to set up tele pre op appt.  

## 2023-04-22 NOTE — Telephone Encounter (Signed)
Pt has been scheduled for tele pre op appt 06/04/23. Pt states the procedure is going to be done 06/22/23. Med rec and consent are done.     Patient Consent for Virtual Visit        Mary Pratt has provided verbal consent on 04/22/2023 for a virtual visit (video or telephone).   CONSENT FOR VIRTUAL VISIT FOR:  Mary Pratt  By participating in this virtual visit I agree to the following:  I hereby voluntarily request, consent and authorize [facility] and its employed or contracted physicians, physician assistants, nurse practitioners or other licensed health care professionals (the Practitioner), to provide me with telemedicine health care services (the "Services") as deemed necessary by the treating Practitioner. I acknowledge and consent to receive the Services by the Practitioner via telemedicine. I understand that the telemedicine visit will involve communicating with the Practitioner through live audiovisual communication technology and the disclosure of certain medical information by electronic transmission. I acknowledge that I have been given the opportunity to request an in-person assessment or other available alternative prior to the telemedicine visit and am voluntarily participating in the telemedicine visit.  I understand that I have the right to withhold or withdraw my consent to the use of telemedicine in the course of my care at any time, without affecting my right to future care or treatment, and that the Practitioner or I may terminate the telemedicine visit at any time. I understand that I have the right to inspect all information obtained and/or recorded in the course of the telemedicine visit and may receive copies of available information for a reasonable fee.  I understand that some of the potential risks of receiving the Services via telemedicine include:  Delay or interruption in medical evaluation due to technological equipment failure or  disruption; Information transmitted may not be sufficient (e.g. poor resolution of images) to allow for appropriate medical decision making by the Practitioner; and/or  In rare instances, security protocols could fail, causing a breach of personal health information.  Furthermore, I acknowledge that it is my responsibility to provide information about my medical history, conditions and care that is complete and accurate to the best of my ability. I acknowledge that Practitioner's advice, recommendations, and/or decision may be based on factors not within their control, such as incomplete or inaccurate data provided by me or distortions of diagnostic images or specimens that may result from electronic transmissions. I understand that the practice of medicine is not an exact science and that Practitioner makes no warranties or guarantees regarding treatment outcomes. I acknowledge that a copy of this consent can be made available to me via my patient portal [facility] MyChart), or I can request a printed copy by calling the office of [facility].    I understand that my insurance will be billed for this visit.   I have read or had this consent read to me. I understand the contents of this consent, which adequately explains the benefits and risks of the Services being provided via telemedicine.  I have been provided ample opportunity to ask questions regarding this consent and the Services and have had my questions answered to my satisfaction. I give my informed consent for the services to be provided through the use of telemedicine in my medical care

## 2023-04-22 NOTE — Telephone Encounter (Signed)
Pt has been scheduled for tele pre op appt 06/04/23. Pt states the procedure is going to be done 06/22/23. Med rec and consent are done.

## 2023-04-22 NOTE — Telephone Encounter (Signed)
   Name: Mary Pratt  DOB: [birth year]  MRN: [id number]  Primary Cardiologist: Dina Rich, MD   Preoperative team, please contact this patient and set up a phone call appointment for further preoperative risk assessment. Please obtain consent and complete medication review. Thank you for your help.  I confirm that guidance regarding antiplatelet and oral anticoagulation therapy has been completed and, if necessary, noted below.    Ronney Asters, NP 04/22/2023, 8:34 AM [facility]

## 2023-04-23 ENCOUNTER — Ambulatory Visit (INDEPENDENT_AMBULATORY_CARE_PROVIDER_SITE_OTHER): Payer: BC Managed Care – PPO

## 2023-04-23 DIAGNOSIS — I428 Other cardiomyopathies: Secondary | ICD-10-CM | POA: Diagnosis not present

## 2023-04-23 LAB — CUP PACEART REMOTE DEVICE CHECK
Battery Remaining Longevity: 35 mo
Battery Voltage: 2.95 V
Brady Statistic AP VP Percent: 0.13 %
Brady Statistic AP VS Percent: 0.01 %
Brady Statistic AS VP Percent: 99.8 %
Brady Statistic AS VS Percent: 0.06 %
Brady Statistic RA Percent Paced: 0.15 %
Brady Statistic RV Percent Paced: 99.23 %
Date Time Interrogation Session: 20240816033425
HighPow Impedance: 80 Ohm
Implantable Lead Connection Status: 753985
Implantable Lead Connection Status: 753985
Implantable Lead Connection Status: 753985
Implantable Lead Implant Date: 20170206
Implantable Lead Implant Date: 20170206
Implantable Lead Implant Date: 20170206
Implantable Lead Location: 753858
Implantable Lead Location: 753859
Implantable Lead Location: 753860
Implantable Lead Model: 4398
Implantable Lead Model: 5076
Implantable Pulse Generator Implant Date: 20220520
Lead Channel Impedance Value: 1121 Ohm
Lead Channel Impedance Value: 1140 Ohm
Lead Channel Impedance Value: 1197 Ohm
Lead Channel Impedance Value: 1349 Ohm
Lead Channel Impedance Value: 1349 Ohm
Lead Channel Impedance Value: 296.578
Lead Channel Impedance Value: 299.908
Lead Channel Impedance Value: 309.309
Lead Channel Impedance Value: 369.526
Lead Channel Impedance Value: 374.709
Lead Channel Impedance Value: 418 Ohm
Lead Channel Impedance Value: 513 Ohm
Lead Channel Impedance Value: 513 Ohm
Lead Channel Impedance Value: 551 Ohm
Lead Channel Impedance Value: 703 Ohm
Lead Channel Impedance Value: 722 Ohm
Lead Channel Impedance Value: 779 Ohm
Lead Channel Impedance Value: 988 Ohm
Lead Channel Pacing Threshold Amplitude: 0.5 V
Lead Channel Pacing Threshold Amplitude: 0.625 V
Lead Channel Pacing Threshold Amplitude: 3.75 V
Lead Channel Pacing Threshold Pulse Width: 0.4 ms
Lead Channel Pacing Threshold Pulse Width: 0.4 ms
Lead Channel Pacing Threshold Pulse Width: 1 ms
Lead Channel Sensing Intrinsic Amplitude: 0.625 mV
Lead Channel Sensing Intrinsic Amplitude: 0.625 mV
Lead Channel Sensing Intrinsic Amplitude: 9.75 mV
Lead Channel Sensing Intrinsic Amplitude: 9.75 mV
Lead Channel Setting Pacing Amplitude: 1.5 V
Lead Channel Setting Pacing Amplitude: 2 V
Lead Channel Setting Pacing Amplitude: 3.75 V
Lead Channel Setting Pacing Pulse Width: 0.4 ms
Lead Channel Setting Pacing Pulse Width: 1 ms
Lead Channel Setting Sensing Sensitivity: 0.3 mV
Zone Setting Status: 755011

## 2023-04-26 ENCOUNTER — Ambulatory Visit (INDEPENDENT_AMBULATORY_CARE_PROVIDER_SITE_OTHER): Payer: BC Managed Care – PPO

## 2023-04-26 DIAGNOSIS — J309 Allergic rhinitis, unspecified: Secondary | ICD-10-CM | POA: Diagnosis not present

## 2023-04-26 NOTE — Progress Notes (Signed)
Immunotherapy   Patient Details  Name: Mary Pratt MRN: [id number] Date of Birth: [birth year]  04/26/2023  Doralee Albino started injections for  G-W-D and DM-CR-Mold. Patient received 0.05 ml out of both blue vials with an expiration of 03/28/2024. Patient waited 30 minutes with no problems.  Following schedule: B  Frequency:2 times per week Epi-Pen:Epi-Pen Available  Consent signed and patient instructions given.   Dub Mikes 04/26/2023, 3:18 PM

## 2023-04-30 ENCOUNTER — Ambulatory Visit (INDEPENDENT_AMBULATORY_CARE_PROVIDER_SITE_OTHER): Payer: BC Managed Care – PPO

## 2023-04-30 DIAGNOSIS — J309 Allergic rhinitis, unspecified: Secondary | ICD-10-CM

## 2023-05-03 NOTE — Progress Notes (Signed)
Remote ICD transmission.   

## 2023-05-05 ENCOUNTER — Ambulatory Visit (INDEPENDENT_AMBULATORY_CARE_PROVIDER_SITE_OTHER): Payer: BC Managed Care – PPO

## 2023-05-05 DIAGNOSIS — J309 Allergic rhinitis, unspecified: Secondary | ICD-10-CM

## 2023-05-12 ENCOUNTER — Ambulatory Visit (INDEPENDENT_AMBULATORY_CARE_PROVIDER_SITE_OTHER): Payer: BC Managed Care – PPO

## 2023-05-12 DIAGNOSIS — J309 Allergic rhinitis, unspecified: Secondary | ICD-10-CM | POA: Diagnosis not present

## 2023-05-14 ENCOUNTER — Ambulatory Visit (INDEPENDENT_AMBULATORY_CARE_PROVIDER_SITE_OTHER): Payer: BC Managed Care – PPO

## 2023-05-14 ENCOUNTER — Telehealth: Payer: Self-pay | Admitting: Allergy & Immunology

## 2023-05-14 DIAGNOSIS — J309 Allergic rhinitis, unspecified: Secondary | ICD-10-CM

## 2023-05-14 MED ORDER — BREZTRI AEROSPHERE 160-9-4.8 MCG/ACT IN AERO: 2.0000 | INHALATION_SPRAY | Freq: Two times a day (BID) | RESPIRATORY_TRACT | 5 refills | Status: AC

## 2023-05-14 NOTE — Telephone Encounter (Signed)
I sent in [place] and attached the copay card. We could give her a sample as well. It should be much more affordable.

## 2023-05-14 NOTE — Telephone Encounter (Signed)
What do you recommend for pt as far as samples?

## 2023-05-14 NOTE — Telephone Encounter (Signed)
I called patient and informed of note. I also informed patient that sample has been placed up front at the [facility] office for pick up.

## 2023-05-14 NOTE — Telephone Encounter (Signed)
Patient called stating she is needing another sample of a medication for her asthma. Patient states Advair is not covered by insurance and is wanting a sample of something for her asthma.

## 2023-05-14 NOTE — Telephone Encounter (Signed)
Called and spoke to patient and informed her of the message per Dr. Dellis Anes. Patient verbalize understanding and agreed to get medication.

## 2023-05-17 ENCOUNTER — Encounter: Payer: Self-pay | Admitting: Neurology

## 2023-05-17 NOTE — Telephone Encounter (Signed)
I have already performed an extensive workup. I unfortunately do not have a neurologic explanation for her symptoms.  Excessive fatigue would not fall under neurology.  This should be discussed with her PCP.

## 2023-05-19 ENCOUNTER — Ambulatory Visit (INDEPENDENT_AMBULATORY_CARE_PROVIDER_SITE_OTHER): Payer: Self-pay

## 2023-05-19 DIAGNOSIS — J309 Allergic rhinitis, unspecified: Secondary | ICD-10-CM | POA: Diagnosis not present

## 2023-06-02 ENCOUNTER — Ambulatory Visit (INDEPENDENT_AMBULATORY_CARE_PROVIDER_SITE_OTHER): Payer: BC Managed Care – PPO

## 2023-06-02 DIAGNOSIS — J309 Allergic rhinitis, unspecified: Secondary | ICD-10-CM

## 2023-06-04 ENCOUNTER — Ambulatory Visit: Payer: BC Managed Care – PPO | Attending: Internal Medicine | Admitting: Physician Assistant

## 2023-06-04 ENCOUNTER — Ambulatory Visit (INDEPENDENT_AMBULATORY_CARE_PROVIDER_SITE_OTHER): Payer: BC Managed Care – PPO

## 2023-06-04 DIAGNOSIS — Z01810 Encounter for preprocedural cardiovascular examination: Secondary | ICD-10-CM

## 2023-06-04 DIAGNOSIS — J309 Allergic rhinitis, unspecified: Secondary | ICD-10-CM | POA: Diagnosis not present

## 2023-06-04 NOTE — Progress Notes (Signed)
Virtual Visit via Telephone Note   Because of [employer]'s co-morbid illnesses, she is at least at moderate risk for complications without adequate follow up.  This format is felt to be most appropriate for this patient at this time.  The patient did not have access to video technology/had technical difficulties with video requiring transitioning to audio format only (telephone).  All issues noted in this document were discussed and addressed.  No physical exam could be performed with this format.  Please refer to the patient's chart for her consent to telehealth for [facility].  Evaluation Performed:  Preoperative cardiovascular risk assessment _____________   Date:  06/04/2023   Patient ID:  Mary Pratt, DOB 1971/01/28, MRN [id number] Patient Location:  Home Provider location:   Office  Primary Care Provider:  [name], MD Primary Cardiologist:  Dina Rich, MD  Chief Complaint / Patient Profile   52 y.o. y/o adult with a h/o nonischemic cardiomyopathy with CHF, ICD, HTN who is pending FCE with physical therapist and presents today for telephonic preoperative cardiovascular risk assessment.  History of Present Illness    Mary Pratt is a 52 y.o. adult who presents via audio/video conferencing for a telehealth visit today.  Pt was last seen in cardiology clinic on 11/09/2018 by Dr. Graciela Husbands.  At that time Mary Pratt was doing well .  The patient is now pending procedure as outlined above. Since her last visit, she remains stable from a cardiac standpoint. She denies chest pain and can complete 4.0 METS without angina. She hurt her hand at work, which is not limiting her mobility.  Today she denies chest pain, shortness of breath, lower extremity edema, fatigue, palpitations, melena, hematuria, hemoptysis, diaphoresis, weakness, presyncope, syncope, orthopnea, and PND.   Past Medical History    Past Medical History:  Diagnosis Date   Abnormal pap  05/2012   ASCUS + HPV, 11/14 LGSIL   Anxiety    Asthma    Cardiac defibrillator in place    Chronic systolic CHF (congestive heart failure) (HCC)    a. 02/2015 Echo: EF 20%, sev dil w/ diff HK, worse @ inf base. Tiv AI, mild MR, mod dil LA, PASP .   Functional dyspepsia    early satiety    GERD with stricture    dilated 2010   HELICOBACTER PYLORI GASTRITIS 01/15/2009   dyspnea, ? reaction to Biaxin/amoxicillin  Pylera 01/15/09 - completed therapy      IBS (irritable bowel syndrome)    NICM (nonischemic cardiomyopathy) (HCC)    a. 02/2015 Cath: nl cors, EF 15%, mild PAH;  b. 02/2015 Enrolled in VEST trial.   Sickle cell trait (HCC)    Situational depression    "son died"   Vitamin B 12 deficiency 11/2022   Past Surgical History:  Procedure Laterality Date   BIV ICD GENERATOR CHANGEOUT N/A 01/24/2021   Procedure: BIV ICD GENERATOR CHANGEOUT;  Surgeon: Duke Salvia, MD;  Location: [facility] INVASIVE CV LAB;  Service: Cardiovascular;  Laterality: N/A;   CARDIAC CATHETERIZATION N/A 02/26/2015   Procedure: Right/Left Heart Cath and Coronary Angiography;  Surgeon: Lennette Bihari, MD;  Location: [facility] INVASIVE CV LAB;  Service: Cardiovascular;  Laterality: N/A;   COLPOSCOPY  09/08/2011   neg   EP IMPLANTABLE DEVICE N/A 10/14/2015   Procedure: BiV ICD Insertion CRT-D;  Surgeon: Duke Salvia, MD;  Location: [facility] INVASIVE CV LAB;  Service: Cardiovascular;  Laterality: N/A;   ESOPHAGOGASTRODUODENOSCOPY (EGD) WITH  ESOPHAGEAL DILATION  11/30/2008   esophageal ring dilated 52 [place], H. pylori gastritis   HARVEST BONE GRAFT  02/2023   TUBAL LIGATION  09/07/1989   WISDOM TOOTH EXTRACTION     WRIST SURGERY  2023    Allergies  Allergies  Allergen Reactions   Clarithromycin Hives   Amoxicillin Palpitations    REACTION: Tacycardia   Meloxicam Rash   Spiriva Respimat [Tiotropium Bromide Monohydrate] Rash   Symbicort [Budesonide-Formoterol Fumarate] Rash    Home Medications    Prior to  Admission medications   Medication Sig Start Date End Date Taking? Authorizing Provider  albuterol (PROVENTIL HFA;VENTOLIN HFA) 108 (90 BASE) MCG/ACT inhaler Inhale 2 puffs into the lungs every 6 (six) hours as needed for wheezing or shortness of breath.    [provider]  Azelastine HCl 137 MCG/SPRAY SOLN Place into both nostrils. 08/27/22   [provider]  Budeson-Glycopyrrol-Formoterol (BREZTRI AEROSPHERE) 160-9-4.8 MCG/ACT AERO Inhale 2 puffs into the lungs in the morning and at bedtime. 05/14/23 06/13/23  Alfonse Spruce, MD  carvedilol (COREG) 25 MG tablet TAKE 1 TABLET (25 MG TOTAL) BY MOUTH TWICE A DAY WITH MEALS Patient taking differently: Take 25 mg by mouth 2 (two) times daily with a meal. 09/09/22   Branch, Dorothe Pea, MD  ENTRESTO 24-26 MG TAKE 1 TABLET BY MOUTH TWICE A DAY 09/10/22   Antoine Poche, MD  fluocinonide ointment (LIDEX) 0.05 % Apply 1 Application topically daily as needed (eczema).    [provider]  Fluticasone-Salmeterol (ADVAIR DISKUS) 250-50 MCG/DOSE AEPB Inhale 1 puff into the lungs 2 (two) times daily. 03/23/13   Barbaraann Share, MD  furosemide (LASIX) 20 MG tablet TAKE 1 TABLET BY MOUTH EVERY DAY 10/12/22   Antoine Poche, MD  Galcanezumab-gnlm [facility]) 120 MG/ML SOAJ Inject 120 mg into the skin every 28 (twenty-eight) days. 02/22/23   Drema Dallas, DO  metroNIDAZOLE (FLAGYL) 500 MG tablet Take 1 tablet (500 mg total) by mouth 2 (two) times daily. 03/10/23   Chrzanowski, Jami B, NP  nitroGLYCERIN (NITROSTAT) 0.4 MG SL tablet Place 1 tablet (0.4 mg total) under the tongue every 5 (five) minutes x 3 doses as needed for chest pain. 11/09/22   Duke Salvia, MD  pantoprazole (PROTONIX) 40 MG tablet Take 40 mg by mouth daily.    [provider]  sertraline (ZOLOFT) 100 MG tablet Take 100 mg by mouth daily. 01/24/20   [provider]  spironolactone (ALDACTONE) 25 MG tablet TAKE 1 TABLET (25 MG TOTAL) BY MOUTH DAILY.  12/03/22   Antoine Poche, MD  sucralfate (CARAFATE) 1 g tablet Take 1 tablet (1 g total) by mouth 4 (four) times daily as needed. 03/21/23   Sabas Sous, MD  traZODone (DESYREL) 50 MG tablet Take 100 mg by mouth at bedtime as needed. 09/24/22   [provider]    Physical Exam    Vital Signs:  NIKOL LEMAR does not have vital signs available for review today.  Given telephonic nature of communication, physical exam is limited. AAOx3. NAD. Normal affect.  Speech and respirations are unlabored.  Accessory Clinical Findings    None  Assessment & Plan    1.  Preoperative Cardiovascular Risk Assessment:  FCE - with physical therapist  [facility] fax #[phone]  Chart reviewed as part of pre-operative protocol coverage. Given past medical history and time since last visit, based on ACC/AHA guidelines, ALYZE LAUF would be at acceptable risk for  the planned procedure without further cardiovascular testing.   Patient was advised that if she develops new symptoms prior to surgery to contact our office to arrange a follow-up appointment.  She verbalized understanding.  I will route this recommendation to the requesting party via Epic fax function and remove from pre-op pool.    Time:   Today, I have spent 10 minutes with the patient with telehealth technology discussing medical history, symptoms, and management plan.  Prior to patient's phone evaluation I spent greater than 10 minutes reviewing their past medical history and cardiac medications.    Roe Rutherford Duke, PA  06/04/2023, 11:38 AM

## 2023-06-07 ENCOUNTER — Encounter: Payer: Self-pay | Admitting: Cardiology

## 2023-06-08 NOTE — Telephone Encounter (Signed)
I would not have any additional expertise in her brain MRI or neurological symptoms, hopefully her second opinion with [facility] would be able to find out what is going on.   Dominga Ferry MD

## 2023-06-09 ENCOUNTER — Ambulatory Visit (INDEPENDENT_AMBULATORY_CARE_PROVIDER_SITE_OTHER): Payer: Self-pay

## 2023-06-09 DIAGNOSIS — J309 Allergic rhinitis, unspecified: Secondary | ICD-10-CM | POA: Diagnosis not present

## 2023-06-11 ENCOUNTER — Ambulatory Visit (INDEPENDENT_AMBULATORY_CARE_PROVIDER_SITE_OTHER): Payer: BC Managed Care – PPO

## 2023-06-11 DIAGNOSIS — J309 Allergic rhinitis, unspecified: Secondary | ICD-10-CM

## 2023-06-15 ENCOUNTER — Ambulatory Visit: Payer: BC Managed Care – PPO | Admitting: Internal Medicine

## 2023-06-23 ENCOUNTER — Ambulatory Visit (INDEPENDENT_AMBULATORY_CARE_PROVIDER_SITE_OTHER): Payer: Self-pay

## 2023-06-23 DIAGNOSIS — J309 Allergic rhinitis, unspecified: Secondary | ICD-10-CM

## 2023-06-30 ENCOUNTER — Other Ambulatory Visit: Payer: Self-pay | Admitting: Neurology

## 2023-07-05 ENCOUNTER — Telehealth: Payer: Self-pay

## 2023-07-05 NOTE — Telephone Encounter (Signed)
Request received for CRTD MRI clearance request.  Device MRI compatible. Has been seen regularly in clinic.  Normal device function with remotes.  Clearance given (faxed back on 07/05/23).

## 2023-07-14 ENCOUNTER — Ambulatory Visit (INDEPENDENT_AMBULATORY_CARE_PROVIDER_SITE_OTHER): Payer: BC Managed Care – PPO

## 2023-07-14 DIAGNOSIS — J309 Allergic rhinitis, unspecified: Secondary | ICD-10-CM

## 2023-07-16 ENCOUNTER — Ambulatory Visit (INDEPENDENT_AMBULATORY_CARE_PROVIDER_SITE_OTHER): Payer: BC Managed Care – PPO

## 2023-07-16 DIAGNOSIS — J309 Allergic rhinitis, unspecified: Secondary | ICD-10-CM

## 2023-07-16 MED ORDER — EPINEPHRINE 0.3 MG/0.3ML IJ SOAJ: 0.3000 mg | INTRAMUSCULAR | 1 refills | Status: DC | PRN

## 2023-07-23 ENCOUNTER — Ambulatory Visit (INDEPENDENT_AMBULATORY_CARE_PROVIDER_SITE_OTHER): Payer: BC Managed Care – PPO

## 2023-07-23 DIAGNOSIS — I428 Other cardiomyopathies: Secondary | ICD-10-CM

## 2023-07-24 LAB — CUP PACEART REMOTE DEVICE CHECK
Battery Remaining Longevity: 31 mo
Battery Voltage: 2.94 V
Brady Statistic AP VP Percent: 0.12 %
Brady Statistic AP VS Percent: 0.01 %
Brady Statistic AS VP Percent: 99.82 %
Brady Statistic AS VS Percent: 0.04 %
Brady Statistic RA Percent Paced: 0.14 %
Brady Statistic RV Percent Paced: 98.81 %
Date Time Interrogation Session: 20241115044223
HighPow Impedance: 79 Ohm
Implantable Lead Connection Status: 753985
Implantable Lead Connection Status: 753985
Implantable Lead Connection Status: 753985
Implantable Lead Implant Date: 20170206
Implantable Lead Implant Date: 20170206
Implantable Lead Implant Date: 20170206
Implantable Lead Location: 753858
Implantable Lead Location: 753859
Implantable Lead Location: 753860
Implantable Lead Model: 4398
Implantable Lead Model: 5076
Implantable Pulse Generator Implant Date: 20220520
Lead Channel Impedance Value: 1178 Ohm
Lead Channel Impedance Value: 1197 Ohm
Lead Channel Impedance Value: 1235 Ohm
Lead Channel Impedance Value: 1368 Ohm
Lead Channel Impedance Value: 1368 Ohm
Lead Channel Impedance Value: 299.908
Lead Channel Impedance Value: 306.269
Lead Channel Impedance Value: 306.269
Lead Channel Impedance Value: 370.256
Lead Channel Impedance Value: 380 Ohm
Lead Channel Impedance Value: 456 Ohm
Lead Channel Impedance Value: 513 Ohm
Lead Channel Impedance Value: 532 Ohm
Lead Channel Impedance Value: 589 Ohm
Lead Channel Impedance Value: 722 Ohm
Lead Channel Impedance Value: 760 Ohm
Lead Channel Impedance Value: 760 Ohm
Lead Channel Impedance Value: 988 Ohm
Lead Channel Pacing Threshold Amplitude: 0.5 V
Lead Channel Pacing Threshold Amplitude: 0.5 V
Lead Channel Pacing Threshold Amplitude: 3.5 V
Lead Channel Pacing Threshold Pulse Width: 0.4 ms
Lead Channel Pacing Threshold Pulse Width: 0.4 ms
Lead Channel Pacing Threshold Pulse Width: 1 ms
Lead Channel Sensing Intrinsic Amplitude: 0.75 mV
Lead Channel Sensing Intrinsic Amplitude: 0.75 mV
Lead Channel Sensing Intrinsic Amplitude: 10.625 mV
Lead Channel Sensing Intrinsic Amplitude: 10.625 mV
Lead Channel Setting Pacing Amplitude: 1.5 V
Lead Channel Setting Pacing Amplitude: 2 V
Lead Channel Setting Pacing Amplitude: 3.75 V
Lead Channel Setting Pacing Pulse Width: 0.4 ms
Lead Channel Setting Pacing Pulse Width: 1 ms
Lead Channel Setting Sensing Sensitivity: 0.3 mV
Zone Setting Status: 755011

## 2023-08-04 ENCOUNTER — Ambulatory Visit (INDEPENDENT_AMBULATORY_CARE_PROVIDER_SITE_OTHER): Payer: BC Managed Care – PPO | Admitting: *Deleted

## 2023-08-04 DIAGNOSIS — J309 Allergic rhinitis, unspecified: Secondary | ICD-10-CM

## 2023-08-04 NOTE — Progress Notes (Signed)
Remote ICD transmission.   

## 2023-08-11 ENCOUNTER — Ambulatory Visit (INDEPENDENT_AMBULATORY_CARE_PROVIDER_SITE_OTHER): Payer: BC Managed Care – PPO

## 2023-08-11 DIAGNOSIS — J309 Allergic rhinitis, unspecified: Secondary | ICD-10-CM | POA: Diagnosis not present

## 2023-08-17 ENCOUNTER — Other Ambulatory Visit (HOSPITAL_BASED_OUTPATIENT_CLINIC_OR_DEPARTMENT_OTHER): Payer: Self-pay

## 2023-08-17 DIAGNOSIS — R0683 Snoring: Secondary | ICD-10-CM

## 2023-08-17 DIAGNOSIS — R5383 Other fatigue: Secondary | ICD-10-CM

## 2023-08-18 ENCOUNTER — Ambulatory Visit (INDEPENDENT_AMBULATORY_CARE_PROVIDER_SITE_OTHER): Payer: BC Managed Care – PPO

## 2023-08-18 DIAGNOSIS — J309 Allergic rhinitis, unspecified: Secondary | ICD-10-CM | POA: Diagnosis not present

## 2023-08-25 ENCOUNTER — Ambulatory Visit (INDEPENDENT_AMBULATORY_CARE_PROVIDER_SITE_OTHER): Payer: Self-pay

## 2023-08-25 DIAGNOSIS — J309 Allergic rhinitis, unspecified: Secondary | ICD-10-CM

## 2023-08-27 ENCOUNTER — Other Ambulatory Visit (HOSPITAL_COMMUNITY): Payer: Self-pay | Admitting: Neurosurgery

## 2023-08-27 ENCOUNTER — Ambulatory Visit (INDEPENDENT_AMBULATORY_CARE_PROVIDER_SITE_OTHER): Payer: BC Managed Care – PPO

## 2023-08-27 DIAGNOSIS — J309 Allergic rhinitis, unspecified: Secondary | ICD-10-CM

## 2023-08-27 DIAGNOSIS — M5440 Lumbago with sciatica, unspecified side: Secondary | ICD-10-CM

## 2023-08-30 ENCOUNTER — Ambulatory Visit (INDEPENDENT_AMBULATORY_CARE_PROVIDER_SITE_OTHER): Payer: Self-pay

## 2023-08-30 DIAGNOSIS — J309 Allergic rhinitis, unspecified: Secondary | ICD-10-CM | POA: Diagnosis not present

## 2023-09-15 ENCOUNTER — Other Ambulatory Visit: Payer: Self-pay | Admitting: Cardiology

## 2023-09-15 DIAGNOSIS — I5022 Chronic systolic (congestive) heart failure: Secondary | ICD-10-CM

## 2023-09-16 ENCOUNTER — Encounter: Payer: Self-pay | Admitting: Neurology

## 2023-09-22 ENCOUNTER — Ambulatory Visit (INDEPENDENT_AMBULATORY_CARE_PROVIDER_SITE_OTHER): Payer: Self-pay

## 2023-09-22 DIAGNOSIS — J309 Allergic rhinitis, unspecified: Secondary | ICD-10-CM | POA: Diagnosis not present

## 2023-09-29 ENCOUNTER — Ambulatory Visit (INDEPENDENT_AMBULATORY_CARE_PROVIDER_SITE_OTHER): Payer: Self-pay

## 2023-09-29 DIAGNOSIS — J309 Allergic rhinitis, unspecified: Secondary | ICD-10-CM

## 2023-10-06 ENCOUNTER — Ambulatory Visit: Payer: BC Managed Care – PPO | Admitting: Allergy & Immunology

## 2023-10-06 ENCOUNTER — Other Ambulatory Visit: Payer: Self-pay | Admitting: Medical Genetics

## 2023-10-08 ENCOUNTER — Encounter (HOSPITAL_COMMUNITY): Payer: Self-pay | Admitting: Emergency Medicine

## 2023-10-08 ENCOUNTER — Emergency Department (HOSPITAL_COMMUNITY): Payer: BC Managed Care – PPO

## 2023-10-08 ENCOUNTER — Emergency Department (HOSPITAL_COMMUNITY)
Admission: EM | Admit: 2023-10-08 | Discharge: 2023-10-09 | Disposition: A | Discharge status: Home / Self Care | Payer: BC Managed Care – PPO | Attending: Emergency Medicine | Admitting: Emergency Medicine

## 2023-10-08 ENCOUNTER — Other Ambulatory Visit: Payer: Self-pay

## 2023-10-08 DIAGNOSIS — J45909 Unspecified asthma, uncomplicated: Secondary | ICD-10-CM | POA: Diagnosis not present

## 2023-10-08 DIAGNOSIS — Z95810 Presence of automatic (implantable) cardiac defibrillator: Secondary | ICD-10-CM | POA: Diagnosis not present

## 2023-10-08 DIAGNOSIS — R0789 Other chest pain: Secondary | ICD-10-CM | POA: Insufficient documentation

## 2023-10-08 DIAGNOSIS — R079 Chest pain, unspecified: Secondary | ICD-10-CM | POA: Diagnosis present

## 2023-10-08 DIAGNOSIS — I5022 Chronic systolic (congestive) heart failure: Secondary | ICD-10-CM | POA: Insufficient documentation

## 2023-10-08 LAB — CBC
HCT: 34.3 % — ABNORMAL LOW (ref 36.0–46.0)
Hemoglobin: 11.7 g/dL — ABNORMAL LOW (ref 12.0–15.0)
MCH: 27.9 pg (ref 26.0–34.0)
MCHC: 34.1 g/dL (ref 30.0–36.0)
MCV: 81.7 fL (ref 80.0–100.0)
Platelets: 192 10*3/uL (ref 150–400)
RBC: 4.2 MIL/uL (ref 3.87–5.11)
RDW: 14.1 % (ref 11.5–15.5)
WBC: 5.6 10*3/uL (ref 4.0–10.5)
nRBC: 0 % (ref 0.0–0.2)

## 2023-10-08 NOTE — ED Triage Notes (Signed)
Pt via POV c/o central nonradiating chest pain with nausea and hot flashes since 3 days ago. Hx CHF with defibrillator. Pain currently rated 5/10 constant.

## 2023-10-08 NOTE — ED Provider Notes (Signed)
EMERGENCY DEPARTMENT AT [facility] Provider Note   CSN: [id number] Arrival date & time: 10/08/23  2253     History {Add pertinent medical, surgical, social history, OB history to HPI:1} Chief Complaint  Patient presents with   Chest Pain    Mary Pratt is a 53 y.o. adult.  HPI     This is a 53 year old female who presents with concern for chest pain.  Patient reports 2 to 3-day history of anterior chest pain that is nonradiating.  She reports that it feels tight and sharp.  Nothing seems to make it better or worse including exertion.  She does report a history of heart failure but no history of coronary artery disease.  Denies lower extremity swelling.  No recent fevers or cough.  Denies shortness of breath.  Currently she is pain-free.  Home Medications Prior to Admission medications   Medication Sig Start Date End Date Taking? Authorizing Provider  albuterol (PROVENTIL HFA;VENTOLIN HFA) 108 (90 BASE) MCG/ACT inhaler Inhale 2 puffs into the lungs every 6 (six) hours as needed for wheezing or shortness of breath.    [provider]  Azelastine HCl 137 MCG/SPRAY SOLN Place into both nostrils. 08/27/22   [provider]  carvedilol (COREG) 25 MG tablet TAKE 1 TABLET (25 MG TOTAL) BY MOUTH TWICE A DAY WITH MEALS 09/16/23   Antoine Poche, MD  EPINEPHrine (EPIPEN 2-PAK) 0.3 mg/0.3 mL IJ SOAJ injection Inject 0.3 mg into the muscle as needed for anaphylaxis. 07/16/23   Alfonse Spruce, MD  fluocinonide ointment (LIDEX) 0.05 % Apply 1 Application topically daily as needed (eczema).    [provider]  Fluticasone-Salmeterol (ADVAIR DISKUS) 250-50 MCG/DOSE AEPB Inhale 1 puff into the lungs 2 (two) times daily. 03/23/13   Barbaraann Share, MD  furosemide (LASIX) 20 MG tablet TAKE 1 TABLET BY MOUTH EVERY DAY 10/12/22   Antoine Poche, MD  Galcanezumab-gnlm (EMGALITY) 120 MG/ML SOAJ INJECT 240 MG INTO THE SKIN ONCE FOR 1 DOSE. LOADING  DOSE 07/01/23   Drema Dallas, DO  metroNIDAZOLE (FLAGYL) 500 MG tablet Take 1 tablet (500 mg total) by mouth 2 (two) times daily. 03/10/23   Chrzanowski, Jami B, NP  nitroGLYCERIN (NITROSTAT) 0.4 MG SL tablet Place 1 tablet (0.4 mg total) under the tongue every 5 (five) minutes x 3 doses as needed for chest pain. 11/09/22   Duke Salvia, MD  pantoprazole (PROTONIX) 40 MG tablet Take 40 mg by mouth daily.    [provider]  sacubitril-valsartan (ENTRESTO) 24-26 MG TAKE 1 TABLET BY MOUTH TWICE A DAY 09/16/23   Antoine Poche, MD  sertraline (ZOLOFT) 100 MG tablet Take 100 mg by mouth daily. 01/24/20   [provider]  spironolactone (ALDACTONE) 25 MG tablet TAKE 1 TABLET (25 MG TOTAL) BY MOUTH DAILY. 12/03/22   Antoine Poche, MD  sucralfate (CARAFATE) 1 g tablet Take 1 tablet (1 g total) by mouth 4 (four) times daily as needed. 03/21/23   Sabas Sous, MD  traZODone (DESYREL) 50 MG tablet Take 100 mg by mouth at bedtime as needed. 09/24/22   [provider]      Allergies    Clarithromycin, Amoxicillin, Meloxicam, Spiriva respimat [tiotropium bromide monohydrate], and Symbicort [budesonide-formoterol fumarate]    Review of Systems   Review of Systems  Constitutional:  Negative for fever.  Respiratory:  Negative for cough and shortness of breath.   Cardiovascular:  Positive for chest pain. Negative  for leg swelling.  Gastrointestinal:  Negative for abdominal pain, nausea and vomiting.  All other systems reviewed and are negative.   Physical Exam Updated Vital Signs BP (!) 146/82 (BP Location: Right Arm)   Pulse 74   Temp 98.4 F (36.9 C) (Oral)   Resp 16   Ht 1.549 m (5\' 1" )   Wt 71.2 kg   LMP 09/14/2022 (Approximate)   SpO2 99%   BMI 29.66 kg/m  Physical Exam Vitals and nursing note reviewed.  Constitutional:      Appearance: She is well-developed. She is obese. She is not ill-appearing.  HENT:     Head: Normocephalic and atraumatic.  Eyes:      Pupils: Pupils are equal, round, and reactive to light.  Cardiovascular:     Rate and Rhythm: Normal rate and regular rhythm.     Heart sounds: Normal heart sounds. No murmur heard. Pulmonary:     Effort: Pulmonary effort is normal. No respiratory distress.     Breath sounds: Normal breath sounds. No wheezing.  Chest:     Chest wall: No tenderness.  Abdominal:     General: Bowel sounds are normal.     Palpations: Abdomen is soft.     Tenderness: There is no abdominal tenderness. There is no rebound.  Musculoskeletal:     Cervical back: Neck supple.     Right lower leg: No edema.     Left lower leg: No edema.  Lymphadenopathy:     Cervical: No cervical adenopathy.  Skin:    General: Skin is warm and dry.  Neurological:     Mental Status: She is alert and oriented to person, place, and time.     ED Results / Procedures / Treatments   Labs (all labs ordered are listed, but only abnormal results are displayed) Labs Reviewed  BASIC METABOLIC PANEL  CBC  TROPONIN I (HIGH SENSITIVITY)    EKG EKG Interpretation Date/Time:  Friday October 08 2023 23:06:21 EST Ventricular Rate:  74 PR Interval:  166 QRS Duration:  131 QT Interval:  402 QTC Calculation: 446 R Axis:   125  Text Interpretation: Atrial-sensed ventricular-paced rhythm Nonspecific intraventricular conduction delay Probable anteroseptal infarct, recent Lateral leads are also involved No significant change since last tracing Confirmed by Ross Marcus ([id number]) on 10/08/2023 11:11:06 PM  Radiology No results found.  Procedures Procedures  {Document cardiac monitor, telemetry assessment procedure when appropriate:1}  Medications Ordered in ED Medications - No data to display  ED Course/ Medical Decision Making/ A&P   {   Click here for ABCD2, HEART and other calculatorsREFRESH Note before signing :1}                              Medical Decision Making Amount and/or Complexity of Data Reviewed Labs:  ordered. Radiology: ordered.   ***  {Document critical care time when appropriate:1} {Document review of labs and clinical decision tools ie heart score, Chads2Vasc2 etc:1}  {Document your independent review of radiology images, and any outside records:1} {Document your discussion with family members, caretakers, and with consultants:1} {Document social determinants of health affecting pt's care:1} {Document your decision making why or why not admission, treatments were needed:1} Final Clinical Impression(s) / ED Diagnoses Final diagnoses:  None    Rx / DC Orders ED Discharge Orders     None

## 2023-10-09 DIAGNOSIS — R0789 Other chest pain: Secondary | ICD-10-CM | POA: Diagnosis not present

## 2023-10-09 LAB — BASIC METABOLIC PANEL
Anion gap: 8 (ref 5–15)
BUN: 13 mg/dL (ref 6–20)
CO2: 23 mmol/L (ref 22–32)
Calcium: 9.1 mg/dL (ref 8.9–10.3)
Chloride: 105 mmol/L (ref 98–111)
Creatinine, Ser: 0.97 mg/dL (ref 0.44–1.00)
GFR, Estimated: >60 mL/min (ref >60)
Glucose, Bld: 139 mg/dL — ABNORMAL HIGH (ref 70–99)
Potassium: 3.3 mmol/L — ABNORMAL LOW (ref 3.5–5.1)
Sodium: 136 mmol/L (ref 135–145)

## 2023-10-09 LAB — TROPONIN I (HIGH SENSITIVITY)
Troponin I (High Sensitivity): 3 ng/L (ref <18)
Troponin I (High Sensitivity): 4 ng/L (ref <18)

## 2023-10-09 NOTE — Discharge Instructions (Signed)
You were seen today for chest pain.  Your workup today is reassuring including heart testing.  Follow-up closely with cardiology.

## 2023-10-10 ENCOUNTER — Encounter (HOSPITAL_COMMUNITY): Payer: Self-pay

## 2023-10-10 ENCOUNTER — Emergency Department (HOSPITAL_COMMUNITY): Payer: BC Managed Care – PPO

## 2023-10-10 ENCOUNTER — Emergency Department (HOSPITAL_COMMUNITY)
Admission: EM | Admit: 2023-10-10 | Discharge: 2023-10-10 | Disposition: A | Discharge status: Home / Self Care | Payer: BC Managed Care – PPO | Attending: Emergency Medicine | Admitting: Emergency Medicine

## 2023-10-10 ENCOUNTER — Other Ambulatory Visit: Payer: Self-pay

## 2023-10-10 DIAGNOSIS — Z20822 Contact with and (suspected) exposure to covid-19: Secondary | ICD-10-CM | POA: Insufficient documentation

## 2023-10-10 DIAGNOSIS — J45909 Unspecified asthma, uncomplicated: Secondary | ICD-10-CM | POA: Diagnosis not present

## 2023-10-10 DIAGNOSIS — Z7951 Long term (current) use of inhaled steroids: Secondary | ICD-10-CM | POA: Diagnosis not present

## 2023-10-10 DIAGNOSIS — I509 Heart failure, unspecified: Secondary | ICD-10-CM | POA: Insufficient documentation

## 2023-10-10 DIAGNOSIS — R0789 Other chest pain: Secondary | ICD-10-CM | POA: Insufficient documentation

## 2023-10-10 LAB — TROPONIN I (HIGH SENSITIVITY)
Troponin I (High Sensitivity): 5 ng/L (ref <18)
Troponin I (High Sensitivity): 6 ng/L (ref <18)

## 2023-10-10 LAB — CBC
HCT: 35 % — ABNORMAL LOW (ref 36.0–46.0)
Hemoglobin: 11.9 g/dL — ABNORMAL LOW (ref 12.0–15.0)
MCH: 27.9 pg (ref 26.0–34.0)
MCHC: 34 g/dL (ref 30.0–36.0)
MCV: 82.2 fL (ref 80.0–100.0)
Platelets: 205 10*3/uL (ref 150–400)
RBC: 4.26 MIL/uL (ref 3.87–5.11)
RDW: 14.1 % (ref 11.5–15.5)
WBC: 6.5 10*3/uL (ref 4.0–10.5)
nRBC: 0 % (ref 0.0–0.2)

## 2023-10-10 LAB — BASIC METABOLIC PANEL
Anion gap: 11 (ref 5–15)
BUN: 8 mg/dL (ref 6–20)
CO2: 23 mmol/L (ref 22–32)
Calcium: 9.1 mg/dL (ref 8.9–10.3)
Chloride: 107 mmol/L (ref 98–111)
Creatinine, Ser: 0.92 mg/dL (ref 0.44–1.00)
GFR, Estimated: >60 mL/min (ref >60)
Glucose, Bld: 104 mg/dL — ABNORMAL HIGH (ref 70–99)
Potassium: 3.6 mmol/L (ref 3.5–5.1)
Sodium: 141 mmol/L (ref 135–145)

## 2023-10-10 LAB — BRAIN NATRIURETIC PEPTIDE: B Natriuretic Peptide: 36.7 pg/mL (ref 0.0–100.0)

## 2023-10-10 LAB — RESP PANEL BY RT-PCR (RSV, FLU A&B, COVID)  RVPGX2
Influenza A by PCR: NEGATIVE
Influenza B by PCR: NEGATIVE
Resp Syncytial Virus by PCR: NEGATIVE
SARS Coronavirus 2 by RT PCR: NEGATIVE

## 2023-10-10 MED ORDER — IOHEXOL 350 MG/ML SOLN
75.0000 mL | Freq: Once | INTRAVENOUS | Status: AC | PRN
  Administered 2023-10-10: 75 mL via INTRAVENOUS

## 2023-10-10 NOTE — ED Triage Notes (Addendum)
Pt c/o mid-sternal chest pain, shortness of breath, both toes numb, and feeling hot intermittently for past 3-4 days. Pt states she has a defibrillator but it has not shocked her. Pt seen for similar symptoms recently.

## 2023-10-10 NOTE — ED Provider Notes (Signed)
With a history of nonischemic cardiomyopathy and ICD placement presenting to the ED with complaint of persistent left-sided substernal chest pain ongoing for about 4 days.  Patient was seen in the emergency department 2 days ago for similar complaints and had negative troponin testing and an unremarkable x-ray.  She returns if no improvement of her symptoms.  She says she has had intermittent chest pain but does feel it is pleuritic and sometimes worse at night, not necessarily with exertion.  She also describes sharp pains in her throat, says she has a cramping pains mostly in her left lower leg but both legs intermittently.  Denies history of DVT or PE.  She denies any defibrillation shocks.  I reviewed her external cardiology records and she has a history of nonischemic cardiomyopathy with normal coronary arteries and left heart catheterization in 2016, at that time a severely depressed EF at 15% which subsequently led to ICD pacemaker placement, [employer].  On exam her blood pressure is normal and she is not any acute distress.  I do not hear any prominent cardiac murmurs.  She is not in respiratory distress.  Her EKG per my review does not show any acute ischemic findings.  Her chest x-ray per my review was unremarkable.  Her lab work shows negative delta troponins and a normal BNP, no leukocytosis.  I think it is reasonable to proceed with a PE workup given her report of cramping leg pain and potentially pleuritic chest pain.  She does not have any other acute PE risk factors, does not have any evident unilateral leg swelling on exam.  I will also perform a flu and COVID viral test that she reports having "feeling hot all over."  CT PE study reviewed with no emergent findings.  COVID test is negative.  Patient remained stable and relatively comfortable in the room.  She can be discharged with close outpatient follow-up.  She is comfortable with this plan   Terald Sleeper, MD 10/10/23 [phone]

## 2023-10-10 NOTE — Discharge Instructions (Addendum)
As discussed, follow-up with your cardiologist for your chest pain.  Please

## 2023-10-11 ENCOUNTER — Ambulatory Visit (HOSPITAL_COMMUNITY): Admission: RE | Admit: 2023-10-11 | Payer: BC Managed Care – PPO | Source: Ambulatory Visit

## 2023-10-11 ENCOUNTER — Encounter (HOSPITAL_COMMUNITY): Payer: Self-pay

## 2023-10-15 ENCOUNTER — Inpatient Hospital Stay: Payer: BC Managed Care – PPO

## 2023-10-15 ENCOUNTER — Inpatient Hospital Stay: Payer: BC Managed Care – PPO | Attending: Oncology | Admitting: Oncology

## 2023-10-15 ENCOUNTER — Encounter: Payer: Self-pay | Admitting: Oncology

## 2023-10-15 VITALS — BP 137/91 | HR 70 | Temp 97.5°F | Resp 18 | Ht 60.5 in | Wt 161.0 lb

## 2023-10-15 DIAGNOSIS — E538 Deficiency of other specified B group vitamins: Secondary | ICD-10-CM | POA: Diagnosis present

## 2023-10-15 DIAGNOSIS — D573 Sickle-cell trait: Secondary | ICD-10-CM

## 2023-10-15 DIAGNOSIS — D649 Anemia, unspecified: Secondary | ICD-10-CM

## 2023-10-15 DIAGNOSIS — G479 Sleep disorder, unspecified: Secondary | ICD-10-CM

## 2023-10-15 LAB — COMPREHENSIVE METABOLIC PANEL
ALT: 24 U/L (ref 0–44)
AST: 19 U/L (ref 15–41)
Albumin: 4.2 g/dL (ref 3.5–5.0)
Alkaline Phosphatase: 74 U/L (ref 38–126)
Anion gap: 10 (ref 5–15)
BUN: 14 mg/dL (ref 6–20)
CO2: 28 mmol/L (ref 22–32)
Calcium: 9.6 mg/dL (ref 8.9–10.3)
Chloride: 100 mmol/L (ref 98–111)
Creatinine, Ser: 1.05 mg/dL — ABNORMAL HIGH (ref 0.44–1.00)
GFR, Estimated: >60 mL/min (ref >60)
Glucose, Bld: 102 mg/dL — ABNORMAL HIGH (ref 70–99)
Potassium: 3.9 mmol/L (ref 3.5–5.1)
Sodium: 138 mmol/L (ref 135–145)
Total Bilirubin: 0.6 mg/dL (ref 0.0–1.2)
Total Protein: 7.8 g/dL (ref 6.5–8.1)

## 2023-10-15 LAB — CBC WITH DIFFERENTIAL/PLATELET
Abs Immature Granulocytes: 0.02 10*3/uL (ref 0.00–0.07)
Basophils Absolute: 0 10*3/uL (ref 0.0–0.1)
Basophils Relative: 1 %
Eosinophils Absolute: 0.3 10*3/uL (ref 0.0–0.5)
Eosinophils Relative: 3 %
HCT: 37.7 % (ref 36.0–46.0)
Hemoglobin: 12.3 g/dL (ref 12.0–15.0)
Immature Granulocytes: 0 %
Lymphocytes Relative: 39 %
Lymphs Abs: 3 10*3/uL (ref 0.7–4.0)
MCH: 26.6 pg (ref 26.0–34.0)
MCHC: 32.6 g/dL (ref 30.0–36.0)
MCV: 81.4 fL (ref 80.0–100.0)
Monocytes Absolute: 0.6 10*3/uL (ref 0.1–1.0)
Monocytes Relative: 8 %
Neutro Abs: 3.7 10*3/uL (ref 1.7–7.7)
Neutrophils Relative %: 49 %
Platelets: 213 10*3/uL (ref 150–400)
RBC: 4.63 MIL/uL (ref 3.87–5.11)
RDW: 14.4 % (ref 11.5–15.5)
WBC: 7.6 10*3/uL (ref 4.0–10.5)
nRBC: 0 % (ref 0.0–0.2)

## 2023-10-15 LAB — IRON AND TIBC
Iron: 65 ug/dL (ref 28–170)
Saturation Ratios: 17 % (ref 10.4–31.8)
TIBC: 378 ug/dL (ref 250–450)
UIBC: 313 ug/dL

## 2023-10-15 LAB — FERRITIN: Ferritin: 26 ng/mL (ref 11–307)

## 2023-10-15 LAB — VITAMIN B12: Vitamin B-12: 360 pg/mL (ref 180–914)

## 2023-10-15 LAB — FOLATE: Folate: 15.8 ng/mL (ref >5.9)

## 2023-10-15 NOTE — Assessment & Plan Note (Signed)
 Patient reports excessive daytime sleepiness, insomnia.  She reports not able to lay down and has orthopnea. -Refer for sleep study

## 2023-10-15 NOTE — Assessment & Plan Note (Signed)
 Patient previously had vitamin B12 deficiency.  Receiving monthly vitamin B12 shots with primary care. -Recheck vitamin B12 level today -Continue the care with primary care

## 2023-10-15 NOTE — Assessment & Plan Note (Signed)
 Patient has mild anemia on previous labs here. -Will check for nutritional studies including iron panel, vitamin B12 and folate.

## 2023-10-15 NOTE — Patient Instructions (Signed)
 VISIT SUMMARY:  During today's visit, we discussed several of your symptoms including shortness of breath, palpitations, excessive daytime sleepiness, insomnia, and a burning sensation in your scalp and legs. We also reviewed your history of vitamin B12 and thiamine  deficiencies, sickle cell trait, asthma, and anxiety. We have outlined a plan to address each of these issues and will follow up with necessary tests and referrals.  YOUR PLAN:  -SLEEP DISTURBANCE: You are experiencing excessive daytime sleepiness and insomnia, which may be related to sleep apnea. We will refer you for a sleep study to better understand your sleep patterns and determine the appropriate treatment.  -VITAMIN B12 DEFICIENCY: You have a history of vitamin B12 deficiency and are currently receiving monthly B12 injections. Your last B12 level was normal, so we will continue with the monthly injections.  -THIAMINE  DEFICIENCY: You previously had low thiamine  levels. We will check your thiamine  level to see if you need supplementation.  -SICKLE CELL TRAIT: You have a known sickle cell trait. We will confirm this with a hemoglobin electrophoresis test.  -IRON DEFICIENCY: We do not have recent iron studies for you. We will check your iron levels to ensure they are within the normal range.  -GENERAL HEALTH MAINTENANCE: We recommend that you consume more red meat and green leafy vegetables to help maintain your B12 levels. Please return in 2 weeks to review your lab results.  INSTRUCTIONS:  Please follow up in 2 weeks to review your lab results. We will also schedule a sleep study to evaluate your sleep disturbances. If your shortness of breath and palpitations persist, we may refer you to a cardiologist for further evaluation.

## 2023-10-15 NOTE — Progress Notes (Signed)
 [facility] at [facility] HEMATOLOGY NEW VISIT  Toribio Jerel MATSU, MD  REASON FOR REFERRAL: Vitamin B12 deficiency   HISTORY OF PRESENT ILLNESS: Mary Pratt 53 y.o. adult referred for vitamin B12 deficiency. The patient presents with a constellation of symptoms including shortness of breath, palpitations, excessive daytime sleepiness, insomnia, and a burning sensation in the scalp and legs. The shortness of breath is particularly noticeable when lying down. The palpitations are not further characterized. The patient reports excessive daytime sleepiness and insomnia, but is unsure if the two are related.   The burning sensation is experienced in the scalp and legs, and can occur in different parts of the body. The patient also reports itching, particularly after taking a bath.   The patient has a history of vitamin B12 deficiency and is currently receiving monthly B12 injections. The patient also has a history of thiamine  deficiency, but is not currently receiving supplementation. The patient has a known sickle cell trait, diagnosed in childhood. The patient also reports a history of asthma. The patient also reports bloating and lack of appetite.   The patient has a history of anxiety and is currently under the care of a primary care physician for this condition. She reports night sweats, but no recent weight loss. The patient's mood is described as okay.  The patient denies smoking and alcohol use. The patient lives with her son  I have reviewed the past medical history, past surgical history, social history and family history with the patient   ALLERGIES:  is allergic to clarithromycin, amoxicillin, meloxicam, spiriva respimat [tiotropium bromide monohydrate], and symbicort  [budesonide -formoterol  fumarate].  MEDICATIONS:  Current Outpatient Medications  Medication Sig Dispense Refill   albuterol  (PROVENTIL  HFA;VENTOLIN  HFA) 108 (90 BASE) MCG/ACT inhaler Inhale  2 puffs into the lungs every 6 (six) hours as needed for wheezing or shortness of breath.     Azelastine  HCl 137 MCG/SPRAY SOLN Place into both nostrils.     BREO ELLIPTA 200-25 MCG/ACT AEPB Inhale 1 puff into the lungs daily.     carvedilol  (COREG ) 25 MG tablet TAKE 1 TABLET (25 MG TOTAL) BY MOUTH TWICE A DAY WITH MEALS 60 tablet 0   Cyanocobalamin  (VITAMIN B-12 IJ) Inject as directed every 30 (thirty) days.     EPINEPHrine  (EPIPEN  2-PAK) 0.3 mg/0.3 mL IJ SOAJ injection Inject 0.3 mg into the muscle as needed for anaphylaxis. 2 each 1   fluocinonide ointment (LIDEX) 0.05 % Apply 1 Application topically daily as needed (eczema).     Fluticasone -Salmeterol (ADVAIR DISKUS) 250-50 MCG/DOSE AEPB Inhale 1 puff into the lungs 2 (two) times daily. 180 each 4   furosemide  (LASIX ) 20 MG tablet TAKE 1 TABLET BY MOUTH EVERY DAY 90 tablet 1   Galcanezumab -gnlm (EMGALITY ) 120 MG/ML SOAJ INJECT 240 MG INTO THE SKIN ONCE FOR 1 DOSE. LOADING DOSE 1 mL 1   pantoprazole  (PROTONIX ) 40 MG tablet Take 40 mg by mouth daily.     sacubitril -valsartan  (ENTRESTO ) 24-26 MG TAKE 1 TABLET BY MOUTH TWICE A DAY 60 tablet 0   sertraline  (ZOLOFT ) 100 MG tablet Take 100 mg by mouth daily.     spironolactone  (ALDACTONE ) 25 MG tablet TAKE 1 TABLET (25 MG TOTAL) BY MOUTH DAILY. 90 tablet 1   traZODone  (DESYREL ) 50 MG tablet Take 100 mg by mouth at bedtime as needed.     Vitamin D , Ergocalciferol , (DRISDOL) 1.25 MG (50000 UNIT) CAPS capsule Take 50,000 Units by mouth once a week.  nitroGLYCERIN  (NITROSTAT ) 0.4 MG SL tablet Place 1 tablet (0.4 mg total) under the tongue every 5 (five) minutes x 3 doses as needed for chest pain. (Patient not taking: Reported on 10/15/2023) 25 tablet 12   No current facility-administered medications for this visit.     REVIEW OF SYSTEMS:   Constitutional: Denies fevers, chills or night sweats Eyes: Denies blurriness of vision Ears, nose, mouth, throat, and face: Denies mucositis or sore  throat Respiratory: Denies cough, dyspnea or wheezes Cardiovascular: Denies palpitation, chest discomfort or lower extremity swelling Gastrointestinal:  Denies nausea, heartburn or change in bowel habits Skin: Denies abnormal skin rashes Lymphatics: Denies new lymphadenopathy or easy bruising Neurological:Denies numbness, tingling or new weaknesses Behavioral/Psych: Mood is stable, no new changes  All other systems were reviewed with the patient and are negative.  PHYSICAL EXAMINATION:   Vitals:   10/15/23 0912 10/15/23 0927  BP: (!) 140/102 (!) 137/91  Pulse: 70   Resp: 18   Temp: (!) 97.5 F (36.4 C)   SpO2: 95%     GENERAL:alert, no distress and comfortable SKIN: skin color, texture, turgor are normal, no rashes or significant lesions EYES: normal, Conjunctiva are pink and non-injected, sclera clear OROPHARYNX:no exudate, no erythema and lips, buccal mucosa, and tongue normal  NECK: supple, thyroid  normal size, non-tender, without nodularity LYMPH:  no palpable lymphadenopathy in the cervical, axillary or inguinal LUNGS: clear to auscultation and percussion with normal breathing effort HEART: regular rate & rhythm and no murmurs and no lower extremity edema ABDOMEN:abdomen soft, non-tender and normal bowel sounds Musculoskeletal:no cyanosis of digits and no clubbing  NEURO: alert & oriented x 3 with fluent speech, no focal motor/sensory deficits  LABORATORY DATA:  I have reviewed the data as listed and from PCP from 11/24 Vitamin B12: 523 CBC: Hb: 12.4, Hct: 35.9, MCV:78.3,    Lab Results  Component Value Date   WBC 7.6 10/15/2023   NEUTROABS 3.7 10/15/2023   HGB 12.3 10/15/2023   HCT 37.7 10/15/2023   MCV 81.4 10/15/2023   PLT 213 10/15/2023       Chemistry      Component Value Date/Time   NA 138 10/15/2023 0952   NA 139 11/10/2022 1409   K 3.9 10/15/2023 0952   CL 100 10/15/2023 0952   CO2 28 10/15/2023 0952   BUN 14 10/15/2023 0952   BUN 10 11/10/2022  1409   CREATININE 1.05 (H) 10/15/2023 0952   CREATININE 0.91 01/22/2016 1308      Component Value Date/Time   CALCIUM 9.6 10/15/2023 0952   ALKPHOS 74 10/15/2023 0952   AST 19 10/15/2023 0952   ALT 24 10/15/2023 0952   BILITOT 0.6 10/15/2023 0952       RADIOGRAPHIC STUDIES: I have personally reviewed the radiological images as listed and agreed with the findings in the report. CT Angio Chest PE W and/or Wo Contrast CLINICAL DATA:  53 year old female history of mid sternal chest pain and shortness of breath. Evaluate with pulmonary embolism.  EXAM: CT ANGIOGRAPHY CHEST WITH CONTRAST  TECHNIQUE: Multidetector CT imaging of the chest was performed using the standard protocol during bolus administration of intravenous contrast. Multiplanar CT image reconstructions and MIPs were obtained to evaluate the vascular anatomy.  RADIATION DOSE REDUCTION: This exam was performed according to the departmental dose-optimization program which includes automated exposure control, adjustment of the mA and/or kV according to patient size and/or use of iterative reconstruction technique.  CONTRAST:  75mL OMNIPAQUE  IOHEXOL  350 MG/ML SOLN  COMPARISON:  None Available.  FINDINGS: Cardiovascular: No filling defects are noted within the pulmonary arterial tree to suggest pulmonary embolism. Heart size is normal. There is no significant pericardial fluid, thickening or pericardial calcification. No atherosclerotic calcifications are noted in the thoracic aorta or the coronary arteries. Left-sided biventricular pacemaker/AICD with lead tips terminating in the right atrium, right ventricular apex and overlying the lateral wall the left ventricle via the coronary sinus and coronary veins.  Mediastinum/Nodes: No pathologically enlarged mediastinal or hilar lymph nodes. Esophagus is unremarkable in appearance. No axillary lymphadenopathy.  Lungs/Pleura: No acute consolidative airspace disease.  No pleural effusions. No definite suspicious appearing pulmonary nodules or masses are noted.  Upper Abdomen: Small low-attenuation lesions scattered throughout the visualized hepatic parenchyma, incompletely characterized on today's examination, but statistically likely to represent tiny cysts (no imaging follow-up recommended).  Musculoskeletal: There are no aggressive appearing lytic or blastic lesions noted in the visualized portions of the skeleton.  Review of the MIP images confirms the above findings.  IMPRESSION: 1. No acute findings in the thorax to account for the patient's symptoms. Specifically, no evidence of pulmonary embolism.  Electronically Signed   By: Toribio Aye M.D.   On: 10/10/2023 10:38 DG Chest 2 View CLINICAL DATA:  53 year old female with history of chest pain.  EXAM: CHEST - 2 VIEW  COMPARISON:  Chest x-ray 10/08/2023.  FINDINGS: Lung volumes are normal. No consolidative airspace disease. No pleural effusions. No pneumothorax. No pulmonary nodule or mass noted. Pulmonary vasculature and the cardiomediastinal silhouette are within normal limits. Left-sided biventricular pacemaker/AICD with lead tips projecting over the expected location of the right atrium, right ventricle and overlying the left ventricle via the coronary sinus and coronary veins.  IMPRESSION: 1. No radiographic evidence of acute cardiopulmonary disease.  Electronically Signed   By: Toribio Aye M.D.   On: 10/10/2023 05:08    ASSESSMENT & PLAN:  Patient is a 53 year old female referred for vitamin B12 deficiency   Vitamin B12 deficiency Patient previously had vitamin B12 deficiency.  Receiving monthly vitamin B12 shots with primary care. -Recheck vitamin B12 level today -Continue the care with primary care  Sleep disturbance Patient reports excessive daytime sleepiness, insomnia.  She reports not able to lay down and has orthopnea. -Refer for sleep  study  Sickle cell trait [facility]) Patient has a concern for sickle cell trait and inquires about diagnosis. -Will check for hemoglobin electrophoresis today  Anemia Patient has mild anemia on previous labs here. -Will check for nutritional studies including iron panel, vitamin B12 and folate.   Return to clinic in 2 weeks to discuss labs and further management.  Orders Placed This Encounter  Procedures   Ferritin    Standing Status:   Future    Number of Occurrences:   1    Expected Date:   10/15/2023    Expiration Date:   10/14/2024   Folate    Standing Status:   Future    Number of Occurrences:   1    Expected Date:   10/15/2023    Expiration Date:   10/14/2024   Vitamin B12    Standing Status:   Future    Number of Occurrences:   1    Expected Date:   10/15/2023    Expiration Date:   10/14/2024   CBC with Differential/Platelet    Standing Status:   Future    Number of Occurrences:   1    Expected Date:   10/15/2023    Expiration  Date:   10/14/2024   Comprehensive metabolic panel    Standing Status:   Future    Number of Occurrences:   1    Expected Date:   10/15/2023    Expiration Date:   10/14/2024   Iron and TIBC    Standing Status:   Future    Number of Occurrences:   1    Expected Date:   10/15/2023    Expiration Date:   10/14/2024   Methylmalonic acid, serum    Standing Status:   Future    Number of Occurrences:   1    Expected Date:   10/15/2023    Expiration Date:   10/14/2024   Vitamin B1    Standing Status:   Future    Number of Occurrences:   1    Expected Date:   10/15/2023    Expiration Date:   10/14/2024   Hgb Fractionation Cascade    Standing Status:   Future    Number of Occurrences:   1    Expected Date:   10/15/2023    Expiration Date:   10/14/2024    The total time spent in the appointment was 45 minutes encounter with patients including review of chart and various tests results, discussions about plan of care and coordination of care plan   All questions were answered.  The patient knows to call the clinic with any problems, questions or concerns. No barriers to learning was detected.   Mickiel Dry, MD 2/7/202512:20 PM

## 2023-10-15 NOTE — Assessment & Plan Note (Signed)
 Patient has a concern for sickle cell trait and inquires about diagnosis. -Will check for hemoglobin electrophoresis today

## 2023-10-18 LAB — METHYLMALONIC ACID, SERUM: Methylmalonic Acid, Quantitative: 221 nmol/L (ref 0–378)

## 2023-10-19 ENCOUNTER — Ambulatory Visit: Payer: BC Managed Care – PPO | Attending: Nurse Practitioner | Admitting: Nurse Practitioner

## 2023-10-19 VITALS — BP 130/82 | HR 68 | Ht 61.0 in | Wt 162.6 lb

## 2023-10-19 DIAGNOSIS — R0602 Shortness of breath: Secondary | ICD-10-CM | POA: Diagnosis not present

## 2023-10-19 DIAGNOSIS — I428 Other cardiomyopathies: Secondary | ICD-10-CM | POA: Diagnosis not present

## 2023-10-19 DIAGNOSIS — I5022 Chronic systolic (congestive) heart failure: Secondary | ICD-10-CM | POA: Diagnosis not present

## 2023-10-19 DIAGNOSIS — I10 Essential (primary) hypertension: Secondary | ICD-10-CM | POA: Diagnosis not present

## 2023-10-19 DIAGNOSIS — G4700 Insomnia, unspecified: Secondary | ICD-10-CM

## 2023-10-19 LAB — HGB FRACTIONATION CASCADE
Hgb A2: 2.7 % (ref 1.8–3.2)
Hgb A: 58.3 % — ABNORMAL LOW (ref 96.4–98.8)
Hgb F: 0 % (ref 0.0–2.0)
Hgb S: 39 % — ABNORMAL HIGH

## 2023-10-19 LAB — HGB SOLUBILITY: Hgb Solubility: POSITIVE — AB

## 2023-10-19 MED ORDER — FUROSEMIDE 40 MG PO TABS: 40.0000 mg | ORAL_TABLET | Freq: Every day | ORAL | 1 refills | Status: DC

## 2023-10-19 NOTE — Patient Instructions (Addendum)
Medication Instructions:  Your physician has recommended you make the following change in your medication:  Please Increase Lasix to 40 mg Daily   Labwork: In 1 week at [facility]   Testing/Procedures: Your physician has requested that you have an echocardiogram. Echocardiography is a painless test that uses sound waves to create images of your heart. It provides your doctor with information about the size and shape of your heart and how well your heart's chambers and valves are working. This procedure takes approximately one hour. There are no restrictions for this procedure. Please do NOT wear cologne, perfume, aftershave, or lotions (deodorant is allowed). Please arrive 15 minutes prior to your appointment time.  Please note: We ask at that you not bring children with you during ultrasound (echo/ vascular) testing. Due to room size and safety concerns, children are not allowed in the ultrasound rooms during exams. Our front office staff cannot provide observation of children in our lobby area while testing is being conducted. An adult accompanying a patient to their appointment will only be allowed in the ultrasound room at the discretion of the ultrasound technician under special circumstances. We apologize for any inconvenience.  Follow-Up: Your physician recommends that you schedule a follow-up appointment in: 6-8 weeks    Any Other Special Instructions Will Be Listed Below (If Applicable).  If you need a refill on your cardiac medications before your next appointment, please call your pharmacy.

## 2023-10-19 NOTE — Progress Notes (Unsigned)
Cardiology Office Note:    Date: 10/19/2023 ID:  Mary Pratt, DOB [birth year], MRN [id number] PCP:  [name], MD [facility] Providers Cardiologist:  Dina Rich, MD Electrophysiologist:  Sherryl Manges, MD     Referring MD: [name], MD   CC: Here for ED follow-up  History of Present Illness:    Mary Pratt is a 53 y.o. adult with a PMH of chronic systolic CHF, s/p ICD, NICM, hypertension, asthma, history of pain, GERD, and SS trait, who presents today for ED follow-up.  Cardiac catheterization in 2016 revealed no CAD.  EF returned to normal after CRT-D was placed in 2017.  Low A-fib burden on device check previously.  Not on anticoagulation.  I last saw her on September 08, 2022 for follow-up.Had been noticing shortness of breath  for 1 week, does have dry cough.  Planning on going to get evaluated by primary care provider for this.  Denied any fevers, chills, nausea, vomiting, or sick contacts.  Denies any chest pain, palpitations, syncope, presyncope, dizziness, orthopnea, PND, swelling or significant weight changes, acute bleeding, or claudication.  Tolerating her medications well.  Weight stable.    ED visit on 10/08/2023 at [facility] for atypical CP. Nothing seemed to make it better/worse. Work-up was overall unremarkable.   She returned to the ED on October 10, 2023 for chest pain with associated SHOB x 3-4 days. No PE on CT scan. Workup was unremarkable.   Today she presents for follow-up.  She admits to several chief concerns including shortness of breath that is particularly noticed when laying down, admits to 2 pillow orthopnea, says she feels bloated, and also admits to insomnia.  Denies any recurrent chest pain since leaving the ED. Denies any palpitations, syncope, presyncope, dizziness, PND, swelling or significant weight changes, acute bleeding, or claudication.  She tells me she is now being followed by hematologist who is done a thorough workup  regarding her history of sickle cell.  Please see the history of present illness.    All other systems reviewed and are negative.  EKGs/Labs/Other Studies Reviewed:    The following studies were reviewed today:   EKG:  EKG is not ordered today.     Myoview on 07/04/2021: Normal, low risk.   Echocardiogram on 06/25/2021:  1. Abnormal septal motion from pacing . Left ventricular ejection  fraction, by estimation, is 50 to 55%. The left ventricle has low normal  function. The left ventricle has no regional wall motion abnormalities.  There is mild left ventricular  hypertrophy. Left ventricular diastolic parameters were normal.   2. Pacing wires in RA/RV. Right ventricular systolic function is normal.  The right ventricular size is normal. There is normal pulmonary artery  systolic pressure.   3. The mitral valve is normal in structure. Trivial mitral valve  regurgitation. No evidence of mitral stenosis.   4. The aortic valve is normal in structure. Aortic valve regurgitation is  not visualized. No aortic stenosis is present.   5. The inferior vena cava is normal in size with greater than 50%  respiratory variability, suggesting right atrial pressure of 3 mmHg.  Right and left heart cath on 02/26/2015: There is severe left ventricular systolic dysfunction. Very mild elevation of right sided heart pressures Central aortic oxygen saturation 98%; pulmonary artery oxygen saturation 65%.   Dilated severe nonischemic cardiomyopathy with an ejection fraction of 15%.   Normal coronary arteries.   Mild pulmonary hypertension.   RECOMMENDATION:  Aggressive medical therapy for her nonischemic cardiomyopathy with medication titration over the next several months.  Consider interim LifeVest, pending follow-up LV function assessment in several months.  Physical Exam:    VS:  BP 130/82 (BP Location: Left Arm, Patient Position: Sitting, Cuff Size: Normal)   Pulse 68   Ht 5\' 1"  (1.549  m)   Wt 162 lb 9.6 oz (73.8 kg)   LMP 09/14/2022 (Approximate)   SpO2 97%   BMI 30.72 kg/m     Wt Readings from Last 3 Encounters:  10/19/23 162 lb 9.6 oz (73.8 kg)  10/15/23 161 lb (73 kg)  10/10/23 157 lb (71.2 kg)     GEN: Obese, 53 y.o. female in no acute distress HEENT: Normal NECK: No JVD; No carotid bruits CARDIAC: S1/S2, RRR, no murmurs, rubs, gallops; 2+ peripheral pulses throughout, strong and equal bilaterally RESPIRATORY:  Clear to auscultation without rales, wheezing or rhonchi  MUSCULOSKELETAL:  No edema; No deformity  SKIN: Warm and dry NEUROLOGIC:  Alert and oriented x 3 PSYCHIATRIC:  Normal affect   ASSESSMENT & PLAN:    In order of problems listed above:  Chronic systolic CHF, s/p ICD, NICM, shortness of breath EF normal in 2022. Euvolemic and well compensated on exam, however she admits to two-pillow orthopnea and some shortness of breath, and says she feels bloated.  Will update echocardiogram at this time.  Instructed patient to increase Lasix to 40 mg daily and will obtain BMET, magnesium, proBNP in 1 week.  Continue carvedilol, Entresto, and Aldactone. Low sodium diet, fluid restriction <2L, and daily weights encouraged. Educated to contact our office for weight gain of 2 lbs overnight or 5 lbs in one week.  Denies any shocks from her ICD.  Most recent remote device check was normal.  Continue follow-up with EP as scheduled.  Hypertension Blood pressure mildly elevated today, says she just took her medications not long ago.  BP well-controlled at home.  Continue current medication regimen. Heart healthy diet and regular cardiovascular exercise encouraged.    Insomnia Etiology unclear.  Recommended to continue to follow-up with PCP for further evaluation and management.   Disposition: Follow-up with Dr. Dina Rich or [facility] in 6-8 weeks or sooner if anything changes.    Medication Adjustments/Labs and Tests Ordered: Current medicines are reviewed at  length with the patient today.  Concerns regarding medicines are outlined above.  Orders Placed This Encounter  Procedures   Basic Metabolic Panel (BMET)   Magnesium   Brain natriuretic peptide   ECHOCARDIOGRAM COMPLETE   Meds ordered this encounter  Medications   furosemide (LASIX) 40 MG tablet    Sig: Take 1 tablet (40 mg total) by mouth daily.    Dispense:  90 tablet    Refill:  1    Dose Increase 10/19/23    Patient Instructions  Medication Instructions:  Your physician has recommended you make the following change in your medication:  Please Increase Lasix to 40 mg Daily   Labwork: In 1 week at [facility]   Testing/Procedures: Your physician has requested that you have an echocardiogram. Echocardiography is a painless test that uses sound waves to create images of your heart. It provides your doctor with information about the size and shape of your heart and how well your heart's chambers and valves are working. This procedure takes approximately one hour. There are no restrictions for this procedure. Please do NOT wear cologne, perfume, aftershave, or lotions (deodorant is allowed). Please arrive 15 minutes  prior to your appointment time.  Please note: We ask at that you not bring children with you during ultrasound (echo/ vascular) testing. Due to room size and safety concerns, children are not allowed in the ultrasound rooms during exams. Our front office staff cannot provide observation of children in our lobby area while testing is being conducted. An adult accompanying a patient to their appointment will only be allowed in the ultrasound room at the discretion of the ultrasound technician under special circumstances. We apologize for any inconvenience.  Follow-Up: Your physician recommends that you schedule a follow-up appointment in: 6-8 weeks    Any Other Special Instructions Will Be Listed Below (If Applicable).  If you need a refill on your cardiac medications  before your next appointment, please call your pharmacy.   Signed, Sharlene Dory, NP

## 2023-10-20 LAB — VITAMIN B1: Vitamin B1 (Thiamine): 86.6 nmol/L (ref 66.5–200.0)

## 2023-10-22 ENCOUNTER — Ambulatory Visit (INDEPENDENT_AMBULATORY_CARE_PROVIDER_SITE_OTHER): Payer: BC Managed Care – PPO

## 2023-10-22 ENCOUNTER — Emergency Department (HOSPITAL_COMMUNITY)
Admission: EM | Admit: 2023-10-22 | Discharge: 2023-10-23 | Disposition: A | Discharge status: Home / Self Care | Payer: BC Managed Care – PPO | Attending: Emergency Medicine | Admitting: Emergency Medicine

## 2023-10-22 ENCOUNTER — Other Ambulatory Visit: Payer: Self-pay

## 2023-10-22 ENCOUNTER — Encounter (HOSPITAL_COMMUNITY): Payer: Self-pay | Admitting: Emergency Medicine

## 2023-10-22 DIAGNOSIS — M778 Other enthesopathies, not elsewhere classified: Secondary | ICD-10-CM

## 2023-10-22 DIAGNOSIS — M79605 Pain in left leg: Secondary | ICD-10-CM | POA: Insufficient documentation

## 2023-10-22 DIAGNOSIS — I428 Other cardiomyopathies: Secondary | ICD-10-CM | POA: Diagnosis not present

## 2023-10-22 DIAGNOSIS — R519 Headache, unspecified: Secondary | ICD-10-CM | POA: Diagnosis present

## 2023-10-22 DIAGNOSIS — G43809 Other migraine, not intractable, without status migrainosus: Secondary | ICD-10-CM | POA: Diagnosis not present

## 2023-10-22 DIAGNOSIS — M79662 Pain in left lower leg: Secondary | ICD-10-CM

## 2023-10-22 LAB — CUP PACEART REMOTE DEVICE CHECK
Battery Remaining Longevity: 31 mo
Battery Voltage: 2.94 V
Brady Statistic AP VP Percent: 0.17 %
Brady Statistic AP VS Percent: 0.01 %
Brady Statistic AS VP Percent: 99.76 %
Brady Statistic AS VS Percent: 0.05 %
Brady Statistic RA Percent Paced: 0.18 %
Brady Statistic RV Percent Paced: 98.76 %
Date Time Interrogation Session: 20250214012503
HighPow Impedance: 83 Ohm
Implantable Lead Connection Status: 753985
Implantable Lead Connection Status: 753985
Implantable Lead Connection Status: 753985
Implantable Lead Implant Date: 20170206
Implantable Lead Implant Date: 20170206
Implantable Lead Implant Date: 20170206
Implantable Lead Location: 753858
Implantable Lead Location: 753859
Implantable Lead Location: 753860
Implantable Lead Model: 4398
Implantable Lead Model: 5076
Implantable Pulse Generator Implant Date: 20220520
Lead Channel Impedance Value: 1083 Ohm
Lead Channel Impedance Value: 1197 Ohm
Lead Channel Impedance Value: 1254 Ohm
Lead Channel Impedance Value: 1311 Ohm
Lead Channel Impedance Value: 1520 Ohm
Lead Channel Impedance Value: 1558 Ohm
Lead Channel Impedance Value: 315.129
Lead Channel Impedance Value: 323.26 Ohm
Lead Channel Impedance Value: 330.75 Ohm
Lead Channel Impedance Value: 435.141
Lead Channel Impedance Value: 450.8 Ohm
Lead Channel Impedance Value: 456 Ohm
Lead Channel Impedance Value: 513 Ohm
Lead Channel Impedance Value: 646 Ohm
Lead Channel Impedance Value: 665 Ohm
Lead Channel Impedance Value: 817 Ohm
Lead Channel Impedance Value: 874 Ohm
Lead Channel Impedance Value: 931 Ohm
Lead Channel Pacing Threshold Amplitude: 0.5 V
Lead Channel Pacing Threshold Amplitude: 0.5 V
Lead Channel Pacing Threshold Amplitude: 3.25 V
Lead Channel Pacing Threshold Pulse Width: 0.4 ms
Lead Channel Pacing Threshold Pulse Width: 0.4 ms
Lead Channel Pacing Threshold Pulse Width: 1 ms
Lead Channel Sensing Intrinsic Amplitude: 1.75 mV
Lead Channel Sensing Intrinsic Amplitude: 1.75 mV
Lead Channel Sensing Intrinsic Amplitude: 11.5 mV
Lead Channel Sensing Intrinsic Amplitude: 11.5 mV
Lead Channel Setting Pacing Amplitude: 1.5 V
Lead Channel Setting Pacing Amplitude: 2 V
Lead Channel Setting Pacing Amplitude: 3.75 V
Lead Channel Setting Pacing Pulse Width: 0.4 ms
Lead Channel Setting Pacing Pulse Width: 1 ms
Lead Channel Setting Sensing Sensitivity: 0.3 mV
Zone Setting Status: 755011

## 2023-10-22 LAB — BASIC METABOLIC PANEL
Anion gap: 12 (ref 5–15)
BUN: 16 mg/dL (ref 6–20)
CO2: 27 mmol/L (ref 22–32)
Calcium: 9.7 mg/dL (ref 8.9–10.3)
Chloride: 99 mmol/L (ref 98–111)
Creatinine, Ser: 1.1 mg/dL — ABNORMAL HIGH (ref 0.44–1.00)
GFR, Estimated: 60 mL/min — ABNORMAL LOW (ref >60)
Glucose, Bld: 99 mg/dL (ref 70–99)
Potassium: 3.6 mmol/L (ref 3.5–5.1)
Sodium: 138 mmol/L (ref 135–145)

## 2023-10-22 LAB — CBC
HCT: 39.8 % (ref 36.0–46.0)
Hemoglobin: 13.4 g/dL (ref 12.0–15.0)
MCH: 27.6 pg (ref 26.0–34.0)
MCHC: 33.7 g/dL (ref 30.0–36.0)
MCV: 82.1 fL (ref 80.0–100.0)
Platelets: 214 10*3/uL (ref 150–400)
RBC: 4.85 MIL/uL (ref 3.87–5.11)
RDW: 14.2 % (ref 11.5–15.5)
WBC: 5.6 10*3/uL (ref 4.0–10.5)
nRBC: 0 % (ref 0.0–0.2)

## 2023-10-22 NOTE — ED Provider Triage Note (Signed)
Emergency Medicine Provider Triage Evaluation Note  Mary Pratt , a 53 y.o. adult  was evaluated in triage.  Pt complains of "knots" to bilateral posterior legs.  Reports pain began tonight.  Concerned that she has blood clots.  Also states that she has had shortness of breath but no current shortness of breath, reports has been ongoing for quite some time.  Denies chest pain.  Denies history of blood clots.  Denies leg swelling.  Negative Homans' sign bilaterally.  Review of Systems  Positive:  Negative:   Physical Exam  BP 112/86   Pulse 75   Temp 98.1 F (36.7 C) (Oral)   Resp 18   Ht 5\' 1"  (1.549 m)   Wt 71.2 kg   LMP 09/14/2022 (Approximate)   SpO2 97%   BMI 29.66 kg/m  Gen:   Awake, no distress   Resp:  Normal effort  MSK:   Moves extremities without difficulty  Other:    Medical Decision Making  Medically screening exam initiated at 10:31 PM.  Appropriate orders placed.  Doralee Albino was informed that the remainder of the evaluation will be completed by another provider, this initial triage assessment does not replace that evaluation, and the importance of remaining in the ED until their evaluation is complete.     Al Decant, PA-C 10/22/23 2232

## 2023-10-22 NOTE — ED Triage Notes (Signed)
Pt c/o right shoulder pain x 3-4 days and left lower leg (calf down) numbness that started tonight. Pt concerned for a blood clot. Pt ambulated to triage.

## 2023-10-23 ENCOUNTER — Emergency Department (HOSPITAL_COMMUNITY): Payer: BC Managed Care – PPO

## 2023-10-23 DIAGNOSIS — G43809 Other migraine, not intractable, without status migrainosus: Secondary | ICD-10-CM | POA: Diagnosis not present

## 2023-10-23 LAB — D-DIMER, QUANTITATIVE: D-Dimer, Quant: 1.5 ug{FEU}/mL — ABNORMAL HIGH (ref 0.00–0.50)

## 2023-10-23 MED ORDER — KETOROLAC TROMETHAMINE 30 MG/ML IJ SOLN
15.0000 mg | Freq: Once | INTRAMUSCULAR | Status: AC
  Administered 2023-10-23: 15 mg via INTRAVENOUS
  Filled 2023-10-23: qty 1

## 2023-10-23 MED ORDER — METOCLOPRAMIDE HCL 5 MG/ML IJ SOLN
10.0000 mg | Freq: Once | INTRAMUSCULAR | Status: AC
  Administered 2023-10-23: 10 mg via INTRAVENOUS
  Filled 2023-10-23: qty 2

## 2023-10-23 NOTE — ED Provider Notes (Signed)
 [place] EMERGENCY DEPARTMENT AT [facility] Provider Note   CSN: [id number] Arrival date & time: 10/22/23  2211     History  Chief Complaint  Patient presents with   Numbness    Mary Pratt is a 53 y.o. adult.  Presents to the emergency department for evaluation of multiple complaints.  Patient reports that she has been having pain in her right shoulder area that radiates down the arm.  No known injury.  No neck pain.  Patient also reports that she noticed some numbness of the left leg and a painful knot behind the left calf.  This raised concern for her that she might have a blood clot.  No history of blood clots.  Patient now experiencing a headache.  She does report that she does have a history of migraines.       Home Medications Prior to Admission medications   Medication Sig Start Date End Date Taking? Authorizing Provider  albuterol (PROVENTIL HFA;VENTOLIN HFA) 108 (90 BASE) MCG/ACT inhaler Inhale 2 puffs into the lungs every 6 (six) hours as needed for wheezing or shortness of breath.    [provider]  Azelastine HCl 137 MCG/SPRAY SOLN Place into both nostrils. 08/27/22   [provider]  BREO ELLIPTA 200-25 MCG/ACT AEPB Inhale 1 puff into the lungs daily. 08/29/23   [provider]  carvedilol (COREG) 25 MG tablet TAKE 1 TABLET (25 MG TOTAL) BY MOUTH TWICE A DAY WITH MEALS 09/16/23   Branch, Dorothe Pea, MD  Cyanocobalamin (VITAMIN B-12 IJ) Inject as directed every 30 (thirty) days.    [provider]  EPINEPHrine (EPIPEN 2-PAK) 0.3 mg/0.3 mL IJ SOAJ injection Inject 0.3 mg into the muscle as needed for anaphylaxis. 07/16/23   Alfonse Spruce, MD  fluocinonide ointment (LIDEX) 0.05 % Apply 1 Application topically daily as needed (eczema).    [provider]  Fluticasone-Salmeterol (ADVAIR DISKUS) 250-50 MCG/DOSE AEPB Inhale 1 puff into the lungs 2 (two) times daily. 03/23/13   [name], Maree Krabbe, MD   furosemide (LASIX) 40 MG tablet Take 1 tablet (40 mg total) by mouth daily. 10/19/23   Sharlene Dory, NP  Galcanezumab-gnlm (EMGALITY) 120 MG/ML SOAJ INJECT 240 MG INTO THE SKIN ONCE FOR 1 DOSE. LOADING DOSE 07/01/23   Drema Dallas, DO  nitroGLYCERIN (NITROSTAT) 0.4 MG SL tablet Place 1 tablet (0.4 mg total) under the tongue every 5 (five) minutes x 3 doses as needed for chest pain. 11/09/22   Duke Salvia, MD  pantoprazole (PROTONIX) 40 MG tablet Take 40 mg by mouth daily.    [provider]  sacubitril-valsartan (ENTRESTO) 24-26 MG TAKE 1 TABLET BY MOUTH TWICE A DAY 09/16/23   Antoine Poche, MD  sertraline (ZOLOFT) 100 MG tablet Take 100 mg by mouth daily. 01/24/20   [provider]  spironolactone (ALDACTONE) 25 MG tablet TAKE 1 TABLET (25 MG TOTAL) BY MOUTH DAILY. 12/03/22   Antoine Poche, MD  traZODone (DESYREL) 50 MG tablet Take 100 mg by mouth at bedtime as needed. 09/24/22   [provider]  Vitamin D, Ergocalciferol, (DRISDOL) 1.25 MG (50000 UNIT) CAPS capsule Take 50,000 Units by mouth once a week. 06/24/23   [provider]      Allergies    Clarithromycin, Amoxicillin, Meloxicam, Spiriva respimat [tiotropium bromide monohydrate], and Symbicort [budesonide-formoterol fumarate]    Review of Systems   Review of Systems  Physical Exam Updated Vital Signs BP 124/87 (BP Location: Left Arm)  Pulse 66   Temp 97.9 F (36.6 C) (Oral)   Resp 16   Ht 5\' 1"  (1.549 m)   Wt 71.2 kg   LMP 09/14/2022 (Approximate)   SpO2 98%   BMI 29.66 kg/m  Physical Exam Vitals and nursing note reviewed.  Constitutional:      General: She is not in acute distress.    Appearance: She is well-developed.  HENT:     Head: Normocephalic and atraumatic.     Mouth/Throat:     Mouth: Mucous membranes are moist.  Eyes:     General: Vision grossly intact. Gaze aligned appropriately.     Extraocular Movements: Extraocular movements intact.      Conjunctiva/sclera: Conjunctivae normal.  Cardiovascular:     Rate and Rhythm: Normal rate and regular rhythm.     Pulses: Normal pulses.     Heart sounds: Normal heart sounds, S1 normal and S2 normal. No murmur heard.    No friction rub. No gallop.  Pulmonary:     Effort: Pulmonary effort is normal. No respiratory distress.     Breath sounds: Normal breath sounds.  Abdominal:     General: Bowel sounds are normal.     Palpations: Abdomen is soft.     Tenderness: There is no abdominal tenderness. There is no guarding or rebound.     Hernia: No hernia is present.  Musculoskeletal:        General: No swelling.     Right shoulder: Tenderness present. No swelling or deformity. Normal range of motion.     Cervical back: Full passive range of motion without pain, normal range of motion and neck supple. No spinous process tenderness or muscular tenderness. Normal range of motion.     Right lower leg: No edema.     Left lower leg: No swelling or tenderness. No edema.  Skin:    General: Skin is warm and dry.     Capillary Refill: Capillary refill takes less than 2 seconds.     Findings: No ecchymosis, erythema, rash or wound.  Neurological:     General: No focal deficit present.     Mental Status: She is alert and oriented to person, place, and time.     GCS: GCS eye subscore is 4. GCS verbal subscore is 5. GCS motor subscore is 6.     Cranial Nerves: Cranial nerves 2-12 are intact.     Sensory: Sensation is intact.     Motor: Motor function is intact.     Coordination: Coordination is intact.     Comments: Extraocular muscle movement: normal No visual field cut Pupils: equal and reactive both direct and consensual response is normal No nystagmus present    Sensory function is intact to light touch, pinprick Proprioception intact  Grip strength 5/5 symmetric in upper extremities No pronator drift Normal finger to nose bilaterally  Lower extremity strength 5/5 against gravity Normal  heel to shin bilaterally    Psychiatric:        Attention and Perception: Attention normal.        Mood and Affect: Mood normal.        Speech: Speech normal.        Behavior: Behavior normal.     ED Results / Procedures / Treatments   Labs (all labs ordered are listed, but only abnormal results are displayed) Labs Reviewed  BASIC METABOLIC PANEL - Abnormal; Notable for the following components:      Result Value   Creatinine, Ser  1.10 (*)    GFR, Estimated 60 (*)    All other components within normal limits  D-DIMER, QUANTITATIVE - Abnormal; Notable for the following components:   D-Dimer, Quant 1.50 (*)    All other components within normal limits  CBC    EKG None  Radiology CT HEAD WO CONTRAST ( ) Result Date: 10/23/2023 CLINICAL DATA:  Increasing headaches EXAM: CT HEAD WITHOUT CONTRAST TECHNIQUE: Contiguous axial images were obtained from the base of the skull through the vertex without intravenous contrast. RADIATION DOSE REDUCTION: This exam was performed according to the departmental dose-optimization program which includes automated exposure control, adjustment of the mA and/or kV according to patient size and/or use of iterative reconstruction technique. COMPARISON:  08/19/2022 FINDINGS: Brain: No evidence of acute infarction, hemorrhage, hydrocephalus, extra-axial collection or mass lesion/mass effect. Vascular: No hyperdense vessel or unexpected calcification. Skull: Normal. Negative for fracture or focal lesion. Sinuses/Orbits: No acute finding. Other: None. IMPRESSION: No acute intracranial abnormality noted. Electronically Signed   By: Alcide Clever M.D.   On: 10/23/2023 01:02   CUP PACEART REMOTE DEVICE CHECK Result Date: 10/22/2023 Scheduled remote reviewed. Normal device function.  Presenting ST.  1 AHR lasting 3 seconds.  Within the monitoring period, HF diagnostics have been normal. Next remote 91 days. - CS, CVRS   Procedures Procedures    Medications  Ordered in ED Medications  ketorolac (TORADOL) 30 MG/ML injection 15 mg (15 mg Intravenous Given 10/23/23 0115)  metoCLOPramide (REGLAN) injection 10 mg (10 mg Intravenous Given 10/23/23 0116)    ED Course/ Medical Decision Making/ A&P                                 Medical Decision Making Amount and/or Complexity of Data Reviewed Labs: ordered. Radiology: ordered.  Risk Prescription drug management.   Presents with multiple complaints.  Patient complaining of pain in her right shoulder that goes down her arm that has been present for several days.  Examination does not show any signs of cervical radiculopathy.  She has normal strength and sensation in both upper extremities.  There is point tenderness in the anterior shoulder area at the region of the proximal bicep tendon insertion.  Patient complains of numbness of both legs but had some tenderness and felt a "knot" in the left calf.  She is worried about blood clot.  No history of blood clots.  Calf exam reveals no venous cords, no swelling, no tenderness currently.  Distal pulses are palpable.  Blood clot is felt to be low likelihood.  She did have an elevated D-dimer, however.  Will bring back for venous ultrasound.  Patient developed a headache here in the ED and reports a history of migraines.  Head CT unremarkable.  Headache resolved with migraine cocktail.        Final Clinical Impression(s) / ED Diagnoses Final diagnoses:  Shoulder tendinitis, right  Other migraine without status migrainosus, not intractable  Pain of left calf    Rx / DC Orders ED Discharge Orders          Ordered    US Venous Img Lower Unilateral Left        10/23/23 0222              Gilda Crease, MD 10/23/23 [phone]

## 2023-10-23 NOTE — Discharge Instructions (Addendum)
 Your ultrasound did show a blood clot in your left leg.  We are going to prescribe a blood thinner called Eliquis.  Make sure you take this starting today.  Avoid taking anti-inflammatory such as aspirin, ibuprofen or Aleve while taking the blood thinner as this increases your risk of bleeding, if you would have an injury or uncontrolled bleeding need to come back to the ER.  You need to follow-up with the DVT clinic and/or your PCP. The number for the vascular clinic is [phone]

## 2023-10-24 ENCOUNTER — Ambulatory Visit (HOSPITAL_COMMUNITY)
Admission: RE | Admit: 2023-10-24 | Discharge: 2023-10-24 | Disposition: A | Discharge status: Home / Self Care | Payer: BC Managed Care – PPO | Source: Ambulatory Visit | Attending: Emergency Medicine | Admitting: Emergency Medicine

## 2023-10-24 ENCOUNTER — Telehealth (HOSPITAL_COMMUNITY): Payer: Self-pay | Admitting: Physician Assistant

## 2023-10-24 DIAGNOSIS — R600 Localized edema: Secondary | ICD-10-CM | POA: Insufficient documentation

## 2023-10-24 DIAGNOSIS — I824Z2 Acute embolism and thrombosis of unspecified deep veins of left distal lower extremity: Secondary | ICD-10-CM

## 2023-10-24 DIAGNOSIS — M79605 Pain in left leg: Secondary | ICD-10-CM | POA: Insufficient documentation

## 2023-10-24 MED ORDER — APIXABAN (ELIQUIS) VTE STARTER PACK (10MG AND 5MG): ORAL_TABLET | ORAL | 0 refills | Status: DC

## 2023-10-24 NOTE — Telephone Encounter (Cosign Needed)
 Seen last night for left leg pain, had outpatient ultrasound ordered and performed today.  Positive for DVT in left anterior tibial vein.  Patient was seen in the waiting room by me in VT 3.  She has good pulses, minimal swelling, mild left lateral leg pain.  No signs of phlegmasia cerulea dolens or for presents with evidence, discussed at length recent benefits of anticoagulant use.  Patient agreeable with this.  Her has bled score is 0.  We discussed precautions, discussed avoiding NSAIDs.  She is going to follow-up with either PCP or vascular and pain clinic.  I was not able to place referral since she was discharged more than 2 hours ago but she was given the phone number for follow-up.  He was given an Eliquis starter pack prescription sent to her pharmacy.  He was given strict return precautions.

## 2023-10-26 ENCOUNTER — Encounter (HOSPITAL_COMMUNITY): Payer: Self-pay

## 2023-10-26 ENCOUNTER — Other Ambulatory Visit: Payer: Self-pay

## 2023-10-26 ENCOUNTER — Emergency Department (HOSPITAL_COMMUNITY)
Admission: EM | Admit: 2023-10-26 | Discharge: 2023-10-26 | Disposition: A | Discharge status: Home / Self Care | Payer: BC Managed Care – PPO | Attending: Emergency Medicine | Admitting: Emergency Medicine

## 2023-10-26 ENCOUNTER — Emergency Department (HOSPITAL_COMMUNITY): Payer: BC Managed Care – PPO

## 2023-10-26 DIAGNOSIS — J45909 Unspecified asthma, uncomplicated: Secondary | ICD-10-CM | POA: Insufficient documentation

## 2023-10-26 DIAGNOSIS — R519 Headache, unspecified: Secondary | ICD-10-CM | POA: Diagnosis not present

## 2023-10-26 DIAGNOSIS — Z7951 Long term (current) use of inhaled steroids: Secondary | ICD-10-CM | POA: Insufficient documentation

## 2023-10-26 DIAGNOSIS — Z7901 Long term (current) use of anticoagulants: Secondary | ICD-10-CM | POA: Insufficient documentation

## 2023-10-26 DIAGNOSIS — R42 Dizziness and giddiness: Secondary | ICD-10-CM | POA: Insufficient documentation

## 2023-10-26 LAB — CBC WITH DIFFERENTIAL/PLATELET
Abs Immature Granulocytes: 0.01 10*3/uL (ref 0.00–0.07)
Basophils Absolute: 0 10*3/uL (ref 0.0–0.1)
Basophils Relative: 0 %
Eosinophils Absolute: 0.2 10*3/uL (ref 0.0–0.5)
Eosinophils Relative: 4 %
HCT: 37.2 % (ref 36.0–46.0)
Hemoglobin: 12.5 g/dL (ref 12.0–15.0)
Immature Granulocytes: 0 %
Lymphocytes Relative: 33 %
Lymphs Abs: 2 10*3/uL (ref 0.7–4.0)
MCH: 27.8 pg (ref 26.0–34.0)
MCHC: 33.6 g/dL (ref 30.0–36.0)
MCV: 82.7 fL (ref 80.0–100.0)
Monocytes Absolute: 0.5 10*3/uL (ref 0.1–1.0)
Monocytes Relative: 8 %
Neutro Abs: 3.4 10*3/uL (ref 1.7–7.7)
Neutrophils Relative %: 55 %
Platelets: 206 10*3/uL (ref 150–400)
RBC: 4.5 MIL/uL (ref 3.87–5.11)
RDW: 14 % (ref 11.5–15.5)
WBC: 6 10*3/uL (ref 4.0–10.5)
nRBC: 0 % (ref 0.0–0.2)

## 2023-10-26 LAB — BASIC METABOLIC PANEL
Anion gap: 11 (ref 5–15)
BUN: 14 mg/dL (ref 6–20)
CO2: 25 mmol/L (ref 22–32)
Calcium: 9.3 mg/dL (ref 8.9–10.3)
Chloride: 99 mmol/L (ref 98–111)
Creatinine, Ser: 0.92 mg/dL (ref 0.44–1.00)
GFR, Estimated: >60 mL/min (ref >60)
Glucose, Bld: 99 mg/dL (ref 70–99)
Potassium: 3.6 mmol/L (ref 3.5–5.1)
Sodium: 135 mmol/L (ref 135–145)

## 2023-10-26 LAB — URINALYSIS, ROUTINE W REFLEX MICROSCOPIC
Bilirubin Urine: NEGATIVE
Glucose, UA: NEGATIVE mg/dL
Hgb urine dipstick: NEGATIVE
Ketones, ur: NEGATIVE mg/dL
Leukocytes,Ua: NEGATIVE
Nitrite: NEGATIVE
Protein, ur: NEGATIVE mg/dL
Specific Gravity, Urine: 1.011 (ref 1.005–1.030)
pH: 5 (ref 5.0–8.0)

## 2023-10-26 MED ORDER — MECLIZINE HCL 25 MG PO TABS: 25.0000 mg | ORAL_TABLET | Freq: Three times a day (TID) | ORAL | 0 refills | Status: DC | PRN

## 2023-10-26 MED ORDER — DIAZEPAM 2 MG PO TABS: 2.0000 mg | ORAL_TABLET | Freq: Four times a day (QID) | ORAL | 0 refills | Status: DC | PRN

## 2023-10-26 MED ORDER — MECLIZINE HCL 25 MG PO TABS
25.0000 mg | ORAL_TABLET | Freq: Three times a day (TID) | ORAL | 0 refills | Status: DC | PRN
Start: 2023-10-26 — End: 2023-10-26

## 2023-10-26 MED ORDER — HYDROCODONE-ACETAMINOPHEN 5-325 MG PO TABS
1.0000 | ORAL_TABLET | Freq: Once | ORAL | Status: AC
  Administered 2023-10-26: 1 via ORAL
  Filled 2023-10-26: qty 1

## 2023-10-26 MED ORDER — DIAZEPAM 2 MG PO TABS: 2.0000 mg | ORAL_TABLET | Freq: Three times a day (TID) | ORAL | 0 refills | Status: DC | PRN

## 2023-10-26 NOTE — Discharge Instructions (Signed)
 Take the medications prescribed as needed for your symptom of dizziness.  They work better in combination together than the meclizine alone, use caution however as they can make you drowsy.  Do not drive within 4 hours of taking these medications.  Make sure you are drinking plenty of fluids to avoid dehydration.  Plan follow-up with your primary MD in 1 week if your symptoms are not completely resolved.

## 2023-10-26 NOTE — ED Provider Notes (Signed)
 [place] EMERGENCY DEPARTMENT AT [facility] Provider Note   CSN: [id number] Arrival date & time: 10/26/23  1158     History  Chief Complaint  Patient presents with   Dizziness    Mary Pratt is a 53 y.o. adult with a history including GERD, asthma, nonischemic cardiomyopathy and sickle cell trait who was recently diagnosed with a left lower extremity DVT just 2 days ago and has started Eliquis presenting for complaint of dizziness.  She describes sensation of light headedness with positional changes but also describes a room spinning quality which has been intermittent, not necessarily triggered by movement.  She does report a mild right frontal headache has mild nausea, no vomiting, vision changes, neck pain, fever or chills.  She also denies chest pain, shortness of breath and abdominal pain.  Denies history of vertigo.  The history is provided by the patient.       Home Medications Prior to Admission medications   Medication Sig Start Date End Date Taking? Authorizing Provider  APIXABAN Everlene Balls) VTE STARTER PACK (10MG  AND 5MG ) Take as directed on package: start with two-5mg  tablets twice daily for 7 days. On day 8, switch to one-5mg  tablet twice daily. 10/24/23  Yes Beatty, Celeste A, PA-C  BREO ELLIPTA 200-25 MCG/ACT AEPB Inhale 1 puff into the lungs daily. 08/29/23  Yes [provider]  carvedilol (COREG) 25 MG tablet TAKE 1 TABLET (25 MG TOTAL) BY MOUTH TWICE A DAY WITH MEALS 09/16/23  Yes Branch, Dorothe Pea, MD  clobetasol ointment (TEMOVATE) 0.05 % Apply 1 Application topically 2 (two) times daily. 10/26/23  Yes [provider]  Cyanocobalamin (VITAMIN B-12 IJ) Inject as directed every 30 (thirty) days.   Yes [provider]  EPINEPHrine (EPIPEN 2-PAK) 0.3 mg/0.3 mL IJ SOAJ injection Inject 0.3 mg into the muscle as needed for anaphylaxis. 07/16/23  Yes Alfonse Spruce, MD  fluocinonide ointment (LIDEX) 0.05 % Apply 1 Application  topically daily as needed (eczema).   Yes [provider]  fluticasone (FLONASE) 50 MCG/ACT nasal spray Place 2 sprays into both nostrils daily.   Yes [provider]  furosemide (LASIX) 40 MG tablet Take 1 tablet (40 mg total) by mouth daily. 10/19/23  Yes Sharlene Dory, NP  Galcanezumab-gnlm (EMGALITY) 120 MG/ML SOAJ INJECT 240 MG INTO THE SKIN ONCE FOR 1 DOSE. LOADING DOSE Patient taking differently: Inject 240 mg into the skin every 30 (thirty) days. 07/01/23  Yes Jaffe, Adam R, DO  nitroGLYCERIN (NITROSTAT) 0.4 MG SL tablet Place 1 tablet (0.4 mg total) under the tongue every 5 (five) minutes x 3 doses as needed for chest pain. 11/09/22  Yes Duke Salvia, MD  pantoprazole (PROTONIX) 40 MG tablet Take 40 mg by mouth daily.   Yes [provider]  sacubitril-valsartan (ENTRESTO) 24-26 MG TAKE 1 TABLET BY MOUTH TWICE A DAY 09/16/23  Yes Branch, Dorothe Pea, MD  sertraline (ZOLOFT) 100 MG tablet Take 100 mg by mouth daily. 01/24/20  Yes [provider]  spironolactone (ALDACTONE) 25 MG tablet TAKE 1 TABLET (25 MG TOTAL) BY MOUTH DAILY. 12/03/22  Yes BranchDorothe Pea, MD  VITAMIN D, ERGOCALCIFEROL, PO Take 1,000 Units by mouth daily. 06/24/23  Yes [provider]  diazepam (VALIUM) 2 MG tablet Take 1 tablet (2 mg total) by mouth every 8 (eight) hours as needed (dizziness). 10/26/23   Burgess Amor, PA-C  meclizine (ANTIVERT) 25 MG tablet Take 1 tablet (25 mg total) by mouth 3 (three) times  daily as needed for dizziness. 10/26/23   Burgess Amor, PA-C      Allergies    Clarithromycin, Amoxicillin, Meloxicam, Spiriva respimat [tiotropium bromide monohydrate], and Symbicort [budesonide-formoterol fumarate]    Review of Systems   Review of Systems  Constitutional:  Negative for chills and fever.  HENT:  Negative for congestion and sore throat.   Eyes: Negative.  Negative for visual disturbance.  Respiratory:  Negative for chest tightness and shortness of  breath.   Cardiovascular:  Negative for chest pain.  Gastrointestinal:  Positive for nausea. Negative for abdominal pain and vomiting.  Genitourinary: Negative.   Musculoskeletal:  Positive for arthralgias. Negative for joint swelling and neck pain.       Ache left lower leg, site of dvt.   Skin: Negative.  Negative for rash and wound.  Neurological:  Positive for dizziness and headaches. Negative for weakness, light-headedness and numbness.  Psychiatric/Behavioral: Negative.      Physical Exam Updated Vital Signs BP 125/89   Pulse 64   Temp 98 F (36.7 C) (Oral)   Resp 18   Ht 5\' 1"  (1.549 m)   Wt 71.2 kg   LMP 09/14/2022 (Approximate)   SpO2 99%   BMI 29.66 kg/m  Physical Exam Vitals and nursing note reviewed.  Constitutional:      Appearance: She is well-developed.  HENT:     Head: Normocephalic and atraumatic.     Right Ear: Tympanic membrane normal.     Left Ear: Tympanic membrane normal.  Eyes:     Extraocular Movements: Extraocular movements intact.     Conjunctiva/sclera: Conjunctivae normal.     Pupils: Pupils are equal, round, and reactive to light.  Cardiovascular:     Rate and Rhythm: Normal rate and regular rhythm.     Heart sounds: Normal heart sounds.  Pulmonary:     Effort: Pulmonary effort is normal.     Breath sounds: Normal breath sounds. No wheezing.  Abdominal:     General: Bowel sounds are normal.     Palpations: Abdomen is soft.     Tenderness: There is no abdominal tenderness.  Musculoskeletal:        General: Normal range of motion.     Cervical back: Normal range of motion and neck supple.     Right lower leg: No swelling. No edema.     Left lower leg: Tenderness present. No swelling. No edema.     Comments: Mild tenderness to palpation left posterior calf.  There is no obvious unilateral edema.  Lymphadenopathy:     Cervical: No cervical adenopathy.  Skin:    General: Skin is warm and dry.     Capillary Refill: Capillary refill takes  less than 2 seconds.     Findings: No rash.  Neurological:     General: No focal deficit present.     Mental Status: She is alert and oriented to person, place, and time.     GCS: GCS eye subscore is 4. GCS verbal subscore is 5. GCS motor subscore is 6.     Cranial Nerves: No cranial nerve deficit.     Sensory: No sensory deficit.     Coordination: Coordination normal.     Gait: Gait normal.     Deep Tendon Reflexes: Reflexes normal.     Comments: Normal heel-shin, normal rapid alternating movements. Cranial nerves III-XII intact.  No pronator drift.  Psychiatric:        Speech: Speech normal.  Behavior: Behavior normal.        Thought Content: Thought content normal.     ED Results / Procedures / Treatments   Labs (all labs ordered are listed, but only abnormal results are displayed) Labs Reviewed  CBC WITH DIFFERENTIAL/PLATELET  BASIC METABOLIC PANEL  URINALYSIS, ROUTINE W REFLEX MICROSCOPIC    EKG None  Radiology CT Head Wo Contrast Result Date: 10/26/2023 CLINICAL DATA:  Provided history: Vertigo, peripheral. Additional history provided: Dizziness, nausea, recent DVT, on blood thinner. EXAM: CT HEAD WITHOUT CONTRAST TECHNIQUE: Contiguous axial images were obtained from the base of the skull through the vertex without intravenous contrast. RADIATION DOSE REDUCTION: This exam was performed according to the departmental dose-optimization program which includes automated exposure control, adjustment of the mA and/or kV according to patient size and/or use of iterative reconstruction technique. COMPARISON:  Head CT 10/23/2023.  Brain MRI 04/13/2023. FINDINGS: Brain: Cerebral volume is normal. There is no acute intracranial hemorrhage. No demarcated cortical infarct. No extra-axial fluid collection. No evidence of an intracranial mass. No midline shift. Vascular: No hyperdense vessel. Skull: No calvarial fracture or aggressive osseous lesion. Sinuses/Orbits: No mass or acute  finding within the imaged orbits. No significant paranasal sinus disease at the imaged levels. IMPRESSION: No evidence of an acute intracranial abnormality. Electronically Signed   By: Jackey Loge D.O.   On: 10/26/2023 17:10    Procedures Procedures    Medications Ordered in ED Medications  HYDROcodone-acetaminophen (NORCO/VICODIN) 5-325 MG per tablet 1 tablet (1 tablet Oral Given 10/26/23 1612)    ED Course/ Medical Decision Making/ A&P                                 Medical Decision Making Patient presenting with lightheadedness and dizziness described as room spinning, she does not have true exam findings suggesting vertigo however, no nystagmus, symptoms are not triggered by head rotation.  She also had no symptoms with orthostatic vital signs.  She did say she obtain some improvement with meclizine and Valium given here however.  She will be prescribed a small quantity of these medications to help with her symptoms as needed at home.  We discussed sedating characteristic of these medicines and cautions.  Plan follow-up with her PCP in 1 week if not resolved.  Amount and/or Complexity of Data Reviewed Labs: ordered.    Details: Labs reviewed and stable including be met, CBC and urinalysis. Radiology: ordered.    Details: CT head is negative for acute intracranial process, no bleed, with recent Eliquis use when needed to rule out that possibility.  Risk Prescription drug management.           Final Clinical Impression(s) / ED Diagnoses Final diagnoses:  Vertigo    Rx / DC Orders ED Discharge Orders          Ordered           10/26/23 1732       10/26/23 1732    diazepam (VALIUM) 2 MG tablet  Every 8 hours PRN        10/26/23 1734    meclizine (ANTIVERT) 25 MG tablet  3 times daily PRN        10/26/23 1734              Victoriano Lain 10/26/23 1738    Gloris [place], MD 11/01/23 2156

## 2023-10-26 NOTE — ED Provider Triage Note (Signed)
 Emergency Medicine Provider Triage Evaluation Note  NOE PITTSLEY , a 53 y.o. adult  was evaluated in triage.  Pt complains of dizziness nausea and headache.  Symptoms began this morning.  Describes throbbing frontal headache.  Dizziness upon standing.  She describes a sensation of spinning associated with movement.  States she felt as though she was going to faint.  Was seen here on 10/22/2023 diagnosed with DVT left lower extremity.  Currently anticoagulated on apixaban.  Describes having intermittent chest pains, but none currently.  No shortness of breath.  No syncope   Review of Systems  Positive: Dizziness, headache, nausea Negative: Chest pain, shortness of breath  Physical Exam  BP (!) 123/90   Pulse 71   Temp 98.5 F (36.9 C) (Oral)   Resp 15   Ht 5\' 1"  (1.549 m)   Wt 71.2 kg   LMP 09/14/2022 (Approximate)   SpO2 98%   BMI 29.66 kg/m  Gen:   Awake, no distress   Resp:  Normal effort  MSK:   Moves extremities without difficulty     Medical Decision Making  Medically screening exam initiated at 12:45 PM.  Appropriate orders placed.  Doralee Albino was informed that the remainder of the evaluation will be completed by another provider, this initial triage assessment does not replace that evaluation, and the importance of remaining in the ED until their evaluation is complete.     Pauline Aus, PA-C 10/26/23 1248

## 2023-10-26 NOTE — ED Notes (Signed)
Pt ambulated to BR without difficultly

## 2023-10-26 NOTE — ED Triage Notes (Signed)
 Pt arrived via [facility] from home c/o dizziness and nausea. Pt recently seen and discovered she has a DVT. Pt recently started on a blood thinner.

## 2023-10-28 ENCOUNTER — Ambulatory Visit (HOSPITAL_COMMUNITY)
Admission: RE | Admit: 2023-10-28 | Discharge: 2023-10-28 | Disposition: A | Discharge status: Home / Self Care | Payer: BC Managed Care – PPO | Source: Ambulatory Visit | Attending: Neurosurgery | Admitting: Neurosurgery

## 2023-10-28 DIAGNOSIS — M5440 Lumbago with sciatica, unspecified side: Secondary | ICD-10-CM | POA: Diagnosis present

## 2023-10-29 ENCOUNTER — Inpatient Hospital Stay: Payer: BC Managed Care – PPO

## 2023-10-29 ENCOUNTER — Other Ambulatory Visit (HOSPITAL_COMMUNITY): Payer: Self-pay | Admitting: Family Medicine

## 2023-10-29 ENCOUNTER — Inpatient Hospital Stay: Payer: BC Managed Care – PPO | Admitting: Oncology

## 2023-10-29 ENCOUNTER — Emergency Department (HOSPITAL_COMMUNITY)
Admission: EM | Admit: 2023-10-29 | Discharge: 2023-10-30 | Disposition: A | Discharge status: Home / Self Care | Payer: BC Managed Care – PPO | Attending: Emergency Medicine | Admitting: Emergency Medicine

## 2023-10-29 ENCOUNTER — Emergency Department (HOSPITAL_COMMUNITY): Payer: BC Managed Care – PPO

## 2023-10-29 ENCOUNTER — Encounter (HOSPITAL_COMMUNITY): Payer: Self-pay

## 2023-10-29 ENCOUNTER — Telehealth: Payer: Self-pay | Admitting: Cardiology

## 2023-10-29 ENCOUNTER — Other Ambulatory Visit: Payer: Self-pay

## 2023-10-29 VITALS — BP 121/88 | HR 70 | Temp 98.7°F | Resp 18 | Ht 61.0 in | Wt 165.8 lb

## 2023-10-29 DIAGNOSIS — I509 Heart failure, unspecified: Secondary | ICD-10-CM | POA: Diagnosis not present

## 2023-10-29 DIAGNOSIS — Z1231 Encounter for screening mammogram for malignant neoplasm of breast: Secondary | ICD-10-CM

## 2023-10-29 DIAGNOSIS — I82442 Acute embolism and thrombosis of left tibial vein: Secondary | ICD-10-CM

## 2023-10-29 DIAGNOSIS — E611 Iron deficiency: Secondary | ICD-10-CM | POA: Insufficient documentation

## 2023-10-29 DIAGNOSIS — E538 Deficiency of other specified B group vitamins: Secondary | ICD-10-CM

## 2023-10-29 DIAGNOSIS — I5022 Chronic systolic (congestive) heart failure: Secondary | ICD-10-CM

## 2023-10-29 DIAGNOSIS — D573 Sickle-cell trait: Secondary | ICD-10-CM | POA: Diagnosis not present

## 2023-10-29 DIAGNOSIS — G479 Sleep disorder, unspecified: Secondary | ICD-10-CM

## 2023-10-29 DIAGNOSIS — Z7901 Long term (current) use of anticoagulants: Secondary | ICD-10-CM | POA: Diagnosis not present

## 2023-10-29 DIAGNOSIS — R0602 Shortness of breath: Secondary | ICD-10-CM | POA: Diagnosis not present

## 2023-10-29 DIAGNOSIS — Z86718 Personal history of other venous thrombosis and embolism: Secondary | ICD-10-CM | POA: Insufficient documentation

## 2023-10-29 DIAGNOSIS — R079 Chest pain, unspecified: Secondary | ICD-10-CM | POA: Diagnosis present

## 2023-10-29 DIAGNOSIS — Z95810 Presence of automatic (implantable) cardiac defibrillator: Secondary | ICD-10-CM | POA: Diagnosis not present

## 2023-10-29 DIAGNOSIS — I82409 Acute embolism and thrombosis of unspecified deep veins of unspecified lower extremity: Secondary | ICD-10-CM | POA: Insufficient documentation

## 2023-10-29 LAB — BASIC METABOLIC PANEL
Anion gap: 9 (ref 5–15)
Anion gap: 12 (ref 5–15)
BUN: 12 mg/dL (ref 6–20)
BUN: 13 mg/dL (ref 6–20)
CO2: 24 mmol/L (ref 22–32)
CO2: 27 mmol/L (ref 22–32)
Calcium: 9.4 mg/dL (ref 8.9–10.3)
Calcium: 9.5 mg/dL (ref 8.9–10.3)
Chloride: 103 mmol/L (ref 98–111)
Chloride: 96 mmol/L — ABNORMAL LOW (ref 98–111)
Creatinine, Ser: 1.08 mg/dL — ABNORMAL HIGH (ref 0.44–1.00)
Creatinine, Ser: 1.31 mg/dL — ABNORMAL HIGH (ref 0.44–1.00)
GFR, Estimated: >60 mL/min (ref >60)
GFR, Estimated: 48 mL/min — ABNORMAL LOW (ref >60)
Glucose, Bld: 116 mg/dL — ABNORMAL HIGH (ref 70–99)
Glucose, Bld: 99 mg/dL (ref 70–99)
Potassium: 4.1 mmol/L (ref 3.5–5.1)
Potassium: 4.1 mmol/L (ref 3.5–5.1)
Sodium: 136 mmol/L (ref 135–145)
Sodium: 135 mmol/L (ref 135–145)

## 2023-10-29 LAB — CBC
HCT: 35.8 % — ABNORMAL LOW (ref 36.0–46.0)
Hemoglobin: 12.1 g/dL (ref 12.0–15.0)
MCH: 27.1 pg (ref 26.0–34.0)
MCHC: 33.8 g/dL (ref 30.0–36.0)
MCV: 80.3 fL (ref 80.0–100.0)
Platelets: 215 10*3/uL (ref 150–400)
RBC: 4.46 MIL/uL (ref 3.87–5.11)
RDW: 13.9 % (ref 11.5–15.5)
WBC: 6.7 10*3/uL (ref 4.0–10.5)
nRBC: 0 % (ref 0.0–0.2)

## 2023-10-29 LAB — TROPONIN I (HIGH SENSITIVITY): Troponin I (High Sensitivity): 4 ng/L (ref <18)

## 2023-10-29 LAB — BRAIN NATRIURETIC PEPTIDE
B Natriuretic Peptide: 9 pg/mL (ref 0.0–100.0)
B Natriuretic Peptide: 5.5 pg/mL (ref 0.0–100.0)

## 2023-10-29 LAB — MAGNESIUM: Magnesium: 1.9 mg/dL (ref 1.7–2.4)

## 2023-10-29 NOTE — Telephone Encounter (Signed)
   Pt c/o of Chest Pain: STAT if active CP, including tightness, pressure, jaw pain, radiating pain to shoulder/upper arm/back, CP unrelieved by Nitro. Symptoms reported of SOB, nausea, vomiting, sweating.  1. Are you having CP right now? no    2. Are you experiencing any other symptoms (ex. SOB, nausea, vomiting, sweating)? Patient states her left leg has been hurting, and both feet have been cold and numbness. Some nausea and bloating.  She stated she feels like her stomach is to her chest.    3. Is your CP continuous or coming and going? CP comes and goes    4. Have you taken Nitroglycerin? no   5. How long have you been experiencing CP? States having CP for the past 2-3 weeks.     6. If NO CP at time of call then end call with telling Pt to call back or call 911 if Chest pain returns prior to return call from triage team.

## 2023-10-29 NOTE — Assessment & Plan Note (Signed)
 Patient reports excessive daytime sleepiness, insomnia.  She reports not able to lay down and has orthopnea. -Refer for sleep study

## 2023-10-29 NOTE — Progress Notes (Signed)
 [facility] at [facility] HEMATOLOGY FOLLOW-UP VISIT  Mary Chimera, Mary Pratt  REASON FOR FOLLOW-UP: Vitamin B12 deficiency  ASSESSMENT & PLAN:  Patient is a 53 year old female with vitamin B12 deficiency following for the same   DVT (deep venous thrombosis) (HCC) Recent diagnosis of left leg DVT.  Patient has no risk factors.  Unprovoked.No known family history of blood clots. No recent travel or surgeries. No hormonal pill use. Non-smoker.Currently on Eliquis. -Patient had colonoscopy in 2022.  Repeat was suggested in 10 years.  Recent mammogram in 2024 was negative and repeat scheduled for 1 year. -Continue Eliquis -Will do thrombophilia workup for unprovoked clot in a female -Patient will follow-up with GYN for Pap smear -Patient has upcoming appointment with vascular surgery to evaluate for other causes.  Return to clinic in 1 month  Sleep disturbance Patient reports excessive daytime sleepiness, insomnia.  She reports not able to lay down and has orthopnea. -Refer for sleep study  Vitamin B12 deficiency Patient has mild vitamin B12 deficiency.  Getting monthly injections with primary care. -Continue care with primary care  Sickle cell trait (HCC) Hemoglobin electrophoresis consistent with sickle cell trait.  Patient is not anemic at this time. -Continue to monitor  Iron deficiency Patient has very mild iron deficiency with no anemia at this time. -Start over-the-counter iron supplements every other day. -Increase proteins and greens in diet.   Orders Placed This Encounter  Procedures   Beta-2-glycoprotein i abs, IgG/M/A    Standing Status:   Future    Number of Occurrences:   1    Expected Date:   10/29/2023    Expiration Date:   10/28/2024   Cardiolipin antibodies, IgG, IgM, IgA    Standing Status:   Future    Number of Occurrences:   1    Expected Date:   10/29/2023    Expiration Date:   10/28/2024   Factor 5 leiden    Standing Status:    Future    Number of Occurrences:   1    Expected Date:   10/29/2023    Expiration Date:   10/28/2024   Lupus anticoagulant panel    Standing Status:   Future    Number of Occurrences:   1    Expected Date:   10/29/2023    Expiration Date:   10/28/2024   Protein C activity    Standing Status:   Future    Number of Occurrences:   1    Expected Date:   10/29/2023    Expiration Date:   10/28/2024   Protein S activity    Standing Status:   Future    Number of Occurrences:   1    Expected Date:   10/29/2023    Expiration Date:   10/28/2024   Prothrombin gene mutation    Standing Status:   Future    Number of Occurrences:   1    Expected Date:   10/29/2023    Expiration Date:   10/28/2024    The total time spent in the appointment was 40 minutes encounter with patients including review of chart and various tests results, discussions about plan of care and coordination of care plan   All questions were answered. The patient knows to call the clinic with any problems, questions or concerns. No barriers to learning was detected.  Cindie Crumbly, Mary Pratt 2/21/20251:27 PM   SUMMARY OF HEMATOLOGIC HISTORY: 1st episode: 10/24/23 - Left leg DVT: Unprovoked Risk factors:  None Current treatment: Eliquis 5mg  BID   INTERVAL HISTORY: Mary Pratt 53 y.o. adult following for vitamin B12 deficiency.  Patient was recently referred to the ER on 10/24/2023 for left leg pain and a knot.  Venous ultrasound of the left leg showed DVT at this time.  Patient is currently on Eliquis with no complications.  Patient denied any recent travel, prolonged immobility, surgery.  She is a non-smoker and does not use hormonal pills.She reports a persistent feeling of unwellness, characterized by fatigue, lack of energy, and a sensation of burning in the skin.She has been receiving monthly vitamin B12 injections for a long period, and reports a slight improvement in her energy levels since starting this treatment.  Patient  overall feels unwell and fatigued.  We discussed the lab results in detail and the need for a thrombophilia testing considering she has an unprovoked clot.  Patient has no history of miscarriages or stillbirth.  No history of rash or lupus.  Patient is a non-smoker and is active around the house.   I have reviewed the past medical history, past surgical history, social history and family history with the patient   ALLERGIES:  is allergic to clarithromycin, amoxicillin, meloxicam, spiriva respimat [tiotropium bromide monohydrate], and symbicort [budesonide-formoterol fumarate].  MEDICATIONS:  Current Outpatient Medications  Medication Sig Dispense Refill   APIXABAN (ELIQUIS) VTE STARTER PACK (10MG  AND 5MG ) Take as directed on package: start with two-5mg  tablets twice daily for 7 days. On day 8, switch to one-5mg  tablet twice daily. 74 each 0   BREO ELLIPTA 200-25 MCG/ACT AEPB Inhale 1 puff into the lungs daily.     carvedilol (COREG) 25 MG tablet TAKE 1 TABLET (25 MG TOTAL) BY MOUTH TWICE A DAY WITH MEALS 60 tablet 0   clobetasol ointment (TEMOVATE) 0.05 % Apply 1 Application topically 2 (two) times daily.     Cyanocobalamin (VITAMIN B-12 IJ) Inject as directed every 30 (thirty) days.     diazepam (VALIUM) 2 MG tablet Take 1 tablet (2 mg total) by mouth every 8 (eight) hours as needed (dizziness). 20 tablet 0   EPINEPHrine (EPIPEN 2-PAK) 0.3 mg/0.3 mL IJ SOAJ injection Inject 0.3 mg into the muscle as needed for anaphylaxis. 2 each 1   fluocinonide ointment (LIDEX) 0.05 % Apply 1 Application topically daily as needed (eczema).     fluticasone (FLONASE) 50 MCG/ACT nasal spray Place 2 sprays into both nostrils daily.     furosemide (LASIX) 40 MG tablet Take 1 tablet (40 mg total) by mouth daily. 90 tablet 1   Galcanezumab-gnlm (EMGALITY) 120 MG/ML SOAJ INJECT 240 MG INTO THE SKIN ONCE FOR 1 DOSE. LOADING DOSE (Patient taking differently: Inject 240 mg into the skin every 30 (thirty) days.) 1 mL 1    meclizine (ANTIVERT) 25 MG tablet Take 1 tablet (25 mg total) by mouth 3 (three) times daily as needed for dizziness. 20 tablet 0   nitroGLYCERIN (NITROSTAT) 0.4 MG SL tablet Place 1 tablet (0.4 mg total) under the tongue every 5 (five) minutes x 3 doses as needed for chest pain. 25 tablet 12   pantoprazole (PROTONIX) 40 MG tablet Take 40 mg by mouth daily.     sacubitril-valsartan (ENTRESTO) 24-26 MG TAKE 1 TABLET BY MOUTH TWICE A DAY 60 tablet 0   sertraline (ZOLOFT) 100 MG tablet Take 100 mg by mouth daily.     spironolactone (ALDACTONE) 25 MG tablet TAKE 1 TABLET (25 MG TOTAL) BY MOUTH DAILY. 90 tablet 1  Vitamin D, Ergocalciferol, (DRISDOL) 1.25 MG (50000 UNIT) CAPS capsule Take 50,000 Units by mouth once a week.     No current facility-administered medications for this visit.     REVIEW OF SYSTEMS:   Constitutional: Denies fevers, chills or night sweats Eyes: Denies blurriness of vision Ears, nose, mouth, throat, and face: Denies mucositis or sore throat Respiratory: Denies cough, dyspnea or wheezes Cardiovascular: Denies palpitation, chest discomfort or lower extremity swelling Gastrointestinal:  Denies nausea, heartburn or change in bowel habits Skin: Denies abnormal skin rashes Lymphatics: Denies new lymphadenopathy or easy bruising Neurological:Denies numbness, tingling or new weaknesses Behavioral/Psych: Mood is stable, no new changes  All other systems were reviewed with the patient and are negative.  PHYSICAL EXAMINATION:   Vitals:   10/29/23 0806  BP: 121/88  Pulse: 70  Resp: 18  Temp: 98.7 F (37.1 C)  SpO2: 99%    GENERAL:alert, no distress and comfortable LUNGS: clear to auscultation and percussion with normal breathing effort HEART: regular rate & rhythm and no murmurs and no lower extremity edema ABDOMEN:abdomen soft, non-tender and normal bowel sounds Musculoskeletal:no cyanosis of digits and no clubbing  NEURO: alert & oriented x 3 with fluent  speech  LABORATORY DATA:  I have reviewed the data as listed  Lab Results  Component Value Date   WBC 6.0 10/26/2023   NEUTROABS 3.4 10/26/2023   HGB 12.5 10/26/2023   HCT 37.2 10/26/2023   MCV 82.7 10/26/2023   PLT 206 10/26/2023       Chemistry      Component Value Date/Time   NA 136 10/29/2023 0846   NA 139 11/10/2022 1409   K 4.1 10/29/2023 0846   CL 103 10/29/2023 0846   CO2 24 10/29/2023 0846   BUN 12 10/29/2023 0846   BUN 10 11/10/2022 1409   CREATININE 1.08 (H) 10/29/2023 0846   CREATININE 0.91 01/22/2016 1308      Component Value Date/Time   CALCIUM 9.4 10/29/2023 0846   ALKPHOS 74 10/15/2023 0952   AST 19 10/15/2023 0952   ALT 24 10/15/2023 0952   BILITOT 0.6 10/15/2023 0952      Latest Reference Range & Units 10/15/23 09:52  Iron 28 - 170 ug/dL 65  UIBC ug/dL 130  TIBC 865 - 784 ug/dL 696  Saturation Ratios 10.4 - 31.8 % 17  Ferritin 11 - 307 ng/mL 26  Folate >5.9 ng/mL 15.8  Methylmalonic Acid, Quantitative 0 - 378 nmol/L 221  Vitamin B1 (Thiamine) 66.5 - 200.0 nmol/L 86.6  Vitamin B12 180 - 914 pg/mL 360    Latest Reference Range & Units 10/15/23 09:52  Hgb A 96.4 - 98.8 % 58.3 (L)  Hgb A2 1.8 - 3.2 % 2.7  HGB F 0.0 - 2.0 % 0.0  HGB S 0.0 % 39.0 (H)  Hgb Solubility Negative  Positive !  Interpretation, Hgb Fract  Hemoglobin pattern and concentrations are consistent with  sickle cell trait (heterozygous).   (L): Data is abnormally low (H): Data is abnormally high !: Data is abnormal  Latest Reference Range & Units 10/22/23 23:12  D-Dimer, Quant 0.00 - 0.50 ug/mL-FEU 1.50 (H)  (H): Data is abnormally high RADIOGRAPHIC STUDIES: I have personally reviewed the radiological images as listed and agreed with the findings in the report.  Venous ultrasound: 10/24/23 Narrative & Impression  CLINICAL DATA:  Left lower extremity pain and edema.   EXAM: LEFT LOWER EXTREMITY VENOUS DOPPLER ULTRASOUND   TECHNIQUE: Gray-scale sonography with graded  compression, as well  as color Doppler and duplex ultrasound were performed to evaluate the lower extremity deep venous systems from the level of the common femoral vein and including the common femoral, femoral, profunda femoral, popliteal and calf veins including the posterior tibial, peroneal and gastrocnemius veins when visible. The superficial great saphenous vein was also interrogated. Spectral Doppler was utilized to evaluate flow at rest and with distal augmentation maneuvers in the common femoral, femoral and popliteal veins.   COMPARISON:  None Available.   FINDINGS: Contralateral Common Femoral Vein: Respiratory phasicity is normal and symmetric with the symptomatic side. No evidence of thrombus. Normal compressibility.   Common Femoral Vein: No evidence of thrombus. Normal compressibility, respiratory phasicity and response to augmentation.   Saphenofemoral Junction: No evidence of thrombus. Normal compressibility and flow on color Doppler imaging.   Profunda Femoral Vein: No evidence of thrombus. Normal compressibility and flow on color Doppler imaging.   Femoral Vein: No evidence of thrombus. Normal compressibility, respiratory phasicity and response to augmentation.   Popliteal Vein: No evidence of thrombus. Normal compressibility, respiratory phasicity and response to augmentation.   Calf Veins: Posterior tibial and peroneal veins are normally patent. Within the anterior tibial vein, there appears to be segment noncompressibility with thrombus visualized.   Superficial Great Saphenous Vein: No evidence of thrombus. Normal compressibility.   Venous Reflux:  None.   Other Findings: No evidence of superficial thrombophlebitis or abnormal fluid collection.   IMPRESSION: Positive for deep venous thrombosis isolated to the left anterior tibial vein.

## 2023-10-29 NOTE — Telephone Encounter (Signed)
 Reports intermittent chest pain rated 4/10, palpitations, dizziness and SOB, that started 2 weeks. Reports she was seen in ED twice and was found to have a blood clot in left leg. Denies active chest pain. Advised that she needed an evaluation in the ED with the symptoms she is describing. Reports she has been to the ED already for these symptoms and they are going to send her back home. Reports she does not want to go to ED. Reports she is requesting a sooner appointment. Currently scheduled for an echo on 11/04/2023 and with [facility] on 11/30/2023. Advised that message would be sent to provider and if her symptoms get worse, she needed to go to the ED for an evaluation.

## 2023-10-29 NOTE — ED Triage Notes (Signed)
 Patient BIB family for CP intermittently x 3 weeks, took 1 nitroglycerin wth some relief. Does have ICD but did not feel it fire. Patient reports feeling palpitations as well.

## 2023-10-29 NOTE — Assessment & Plan Note (Signed)
 Patient has mild vitamin B12 deficiency.  Getting monthly injections with primary care. -Continue care with primary care

## 2023-10-29 NOTE — Patient Instructions (Signed)
 VISIT SUMMARY:  During today's visit, we discussed your recent health concerns, including the episode of vertigo, the blood clot in your leg, and your ongoing symptoms of fatigue and burning sensation in your skin. We also reviewed your history of carpal tunnel syndrome, wrist surgery, and vitamin B12 injections. Additionally, we addressed your concerns about a potential underlying blood disorder or cancer, and noted your family history of colon cancer.  YOUR PLAN:  -DEEP VEIN THROMBOSIS (DVT): Deep Vein Thrombosis (DVT) is a condition where a blood clot forms in a deep vein, usually in the legs. You will continue taking Eliquis to prevent further clotting. We will also conduct blood tests to check for hereditary conditions that might contribute to blood clot formation.  -IRON DEFICIENCY: Iron deficiency occurs when your body doesn't have enough iron, which can lead to fatigue and other symptoms. Although you are not anemic, you should start taking over-the-counter iron supplements every other day and increase your intake of proteins and grains in your diet.  -SICKLE CELL TRAIT: Sickle Cell Trait is a genetic condition where a person carries one gene for sickle cell disease but usually does not have symptoms. No complications are expected from this condition, so no further action is required at this time.  -GENERAL HEALTH MAINTENANCE: For your general health, continue with your monthly Vitamin B12 injections. Schedule a Pap smear and a mammogram with your primary care provider. We will also refer you to a sleep clinic to evaluate for potential sleep apnea. Please return for a follow-up visit in one month.  INSTRUCTIONS:  Please continue taking Eliquis as prescribed. Start taking over-the-counter iron supplements every other day and increase your intake of proteins and grains. Schedule a Pap smear and a mammogram with your primary care provider. We will refer you to a sleep clinic for evaluation of  potential sleep apnea. Return for a follow-up visit in one month.

## 2023-10-29 NOTE — Assessment & Plan Note (Signed)
 Patient has very mild iron deficiency with no anemia at this time. -Start over-the-counter iron supplements every other day. -Increase proteins and greens in diet.

## 2023-10-29 NOTE — Telephone Encounter (Signed)
 Patient informed and verbalized understanding of plan. Offered nurse visit in 1 week for EKG and orthostatics and she is scheduled for an echo next week. Did not agree to scheduling nurse visit.

## 2023-10-29 NOTE — Progress Notes (Addendum)
 DVT Clinic Note  Name: Mary Pratt     MRN: [id number]     DOB: [birth year]     Sex: adult  PCP: [name], MD  Today's Visit: Visit Information: Initial Visit  Referred to DVT Clinic by: Emergency Department - Carmel [place], PA-C Referred to CPP by: Dr. Karin Lieu Reason for referral:  Chief Complaint  Patient presents with   Med Management - DVT   HISTORY OF PRESENT ILLNESS: Mary Pratt is a 53 y.o. adult with PMH HF, s/p ICD, NICM, HTN, asthma, sclerosing mesenteritis, sickle cell trait, who presents after diagnosis of DVT for medication management. Patient had two visits in the ED 10/08/23 and 10/10/23 reporting chest pain. CTA was negative for PE. Patient then presented to the [facility] ED 10/22/23 complaining of left calf pain and numbness and pain in her right shoulder radiating down the arm. Follow up outpatient ultrasound the next morning revealed DVT in the left anterior tibial vein, and the ED provider started the Eliquis starter pack and referred to DVT Clinic. She presented back to the ED 10/26/23 reporting dizziness, no intracranial abnormality seen on CT head. She is already established with hematology for anemia and sickle cell trait. Was seen by hematology 10/29/23 after diagnosis of DVT and hypercoagulable panel was drawn, next follow up on 11/26/23. She returned to the ED 10/29/23 for chest pain. CT negative for PE. She has follow up with cardiology scheduled.   Today patient reports no history of prior VTE or any family history of VTE. Denies abnormal bleeding or bruising. Denies missed doses of Eliquis. Denies wearing compression stockings.   Positive Thrombotic Risk Factors: None Present Bleeding Risk Factors: Anticoagulant therapy  Negative Thrombotic Risk Factors: Previous VTE, Recent surgery (within 3 months), Recent trauma (within 3 months), Recent admission to hospital with acute illness (within 3 months), Paralysis, paresis, or recent plaster cast immobilization of  lower extremity, Central venous catheterization, Bed rest >72 hours within 3 months, Sedentary journey lasting >8 hours within 4 weeks, Pregnancy, Within 6 weeks postpartum, Recent cesarean section (within 3 months), Estrogen therapy, Testosterone therapy, Erythropoiesis-stimulating agent, Recent COVID diagnosis (within 3 months), Active cancer, Non-malignant, chronic inflammatory condition, Known thrombophilic condition, Smoking, Obesity, Older age  Rx Insurance Coverage:  Commercial Medicaid Rx Affordability: Eliquis was $4/month which is affordable for the patient.  Preferred Pharmacy: Refills sent to [facility] in [place].   Past Medical History:  Diagnosis Date   Abnormal pap 05/2012   ASCUS + HPV, 11/14 LGSIL   Anxiety    Asthma    Cardiac defibrillator in place    Chronic systolic CHF (congestive heart failure) (HCC)    a. 02/2015 Echo: EF 20%, sev dil w/ diff HK, worse @ inf base. Tiv AI, mild MR, mod dil LA, PASP .   Functional dyspepsia    early satiety    GERD with stricture    dilated 2010   HELICOBACTER PYLORI GASTRITIS 01/15/2009   dyspnea, ? reaction to Biaxin/amoxicillin  Pylera 01/15/09 - completed therapy      IBS (irritable bowel syndrome)    NICM (nonischemic cardiomyopathy) (HCC)    a. 02/2015 Cath: nl cors, EF 15%, mild PAH;  b. 02/2015 Enrolled in VEST trial.   Sickle cell trait (HCC)    Situational depression    "son died"   Vitamin B 12 deficiency 11/2022    Past Surgical History:  Procedure Laterality Date   BIV ICD GENERATOR CHANGEOUT N/A 01/24/2021   Procedure: BIV  ICD GENERATOR CHANGEOUT;  Surgeon: Duke Salvia, MD;  Location: [facility] INVASIVE CV LAB;  Service: Cardiovascular;  Laterality: N/A;   CARDIAC CATHETERIZATION N/A 02/26/2015   Procedure: Right/Left Heart Cath and Coronary Angiography;  Surgeon: Lennette Bihari, MD;  Location: [facility] INVASIVE CV LAB;  Service: Cardiovascular;  Laterality: N/A;   COLPOSCOPY  09/08/2011   neg   EP IMPLANTABLE DEVICE N/A  10/14/2015   Procedure: BiV ICD Insertion CRT-D;  Surgeon: Duke Salvia, MD;  Location: [facility] INVASIVE CV LAB;  Service: Cardiovascular;  Laterality: N/A;   ESOPHAGOGASTRODUODENOSCOPY (EGD) WITH ESOPHAGEAL DILATION  11/30/2008   esophageal ring dilated 54 French, H. pylori gastritis   HARVEST BONE GRAFT  02/2023   TUBAL LIGATION  09/07/1989   WISDOM TOOTH EXTRACTION     WRIST SURGERY  2023    Social History   Socioeconomic History   Marital status: Single    Spouse name: Not on file   Number of children: 3   Years of education: Not on file   Highest education level: Not on file  Occupational History   Occupation: texturing operator    Employer: [employer]  Tobacco Use   Smoking status: Never    Passive exposure: Never   Smokeless tobacco: Never  Vaping Use   Vaping status: Never Used  Substance and Sexual Activity   Alcohol use: Not Currently    Comment: occasional wine   Drug use: No   Sexual activity: Yes    Partners: Male    Birth control/protection: Surgical    Comment: BTL  Other Topics Concern   Not on file  Social History Narrative   Single, 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   2 sons 1 daughter 1 son deceased   Drinks caffeine up to a few a day, never smoker no drug use   Occ wine   Social Drivers of Corporate investment banker Strain: Not on file  Food Insecurity: No Food Insecurity (10/15/2023)   Hunger Vital Sign    Worried About Running Out of Food in the Last Year: Never true    Ran Out of Food in the Last Year: Never true  Transportation Needs: No Transportation Needs (10/15/2023)   PRAPARE - Administrator, Civil Service (Medical): No    Lack of Transportation (Non-Medical): No  Physical Activity: Not on file  Stress: Not on file  Social Connections: Unknown (01/20/2022)   [facility]   Social Network    Social Network: Not on file  Intimate Partner Violence: Not At Risk (10/15/2023)    Humiliation, Afraid, Rape, and Kick questionnaire    Fear of Current or Ex-Partner: No    Emotionally Abused: No    Physically Abused: No    Sexually Abused: No    Family History  Problem Relation Age of Onset   Diabetes Mother    Hypertension Mother    Hypothyroidism Mother    Breast cancer Maternal Aunt    Brain cancer Maternal Uncle    Asthma Paternal Aunt    Prostate cancer Paternal Uncle    Lung cancer Maternal Grandmother    Eczema Daughter    Allergic rhinitis Daughter    Colon cancer Neg Hx    Colon polyps Neg Hx    Esophageal cancer Neg Hx     Allergies as of 11/01/2023 - Review Complete 11/01/2023  Allergen Reaction Noted   Clarithromycin Hives    Amoxicillin Palpitations  Meloxicam Rash 01/14/2022   Spiriva respimat [tiotropium bromide monohydrate] Rash 04/29/2021   Symbicort [budesonide-formoterol fumarate] Rash 03/26/2023    Current Outpatient Medications on File Prior to Encounter  Medication Sig Dispense Refill   APIXABAN (ELIQUIS) VTE STARTER PACK (10MG  AND 5MG ) Take as directed on package: start with two-5mg  tablets twice daily for 7 days. On day 8, switch to one-5mg  tablet twice daily. 74 each 0   BREO ELLIPTA 200-25 MCG/ACT AEPB Inhale 1 puff into the lungs daily.     carvedilol (COREG) 25 MG tablet TAKE 1 TABLET (25 MG TOTAL) BY MOUTH TWICE A DAY WITH MEALS 60 tablet 0   clobetasol ointment (TEMOVATE) 0.05 % Apply 1 Application topically 2 (two) times daily.     Cyanocobalamin (VITAMIN B-12 IJ) Inject as directed every 30 (thirty) days.     diazepam (VALIUM) 2 MG tablet Take 1 tablet (2 mg total) by mouth every 8 (eight) hours as needed (dizziness). 20 tablet 0   ENTRESTO 24-26 MG TAKE 1 TABLET BY MOUTH TWICE A DAY 60 tablet 0   EPINEPHrine (EPIPEN 2-PAK) 0.3 mg/0.3 mL IJ SOAJ injection Inject 0.3 mg into the muscle as needed for anaphylaxis. 2 each 1   fluocinonide ointment (LIDEX) 0.05 % Apply 1 Application topically daily as needed (eczema).      fluticasone (FLONASE) 50 MCG/ACT nasal spray Place 2 sprays into both nostrils daily.     furosemide (LASIX) 40 MG tablet Take 1 tablet (40 mg total) by mouth daily. 90 tablet 1   Galcanezumab-gnlm (EMGALITY) 120 MG/ML SOAJ INJECT 240 MG INTO THE SKIN ONCE FOR 1 DOSE. LOADING DOSE (Patient taking differently: Inject 240 mg into the skin every 30 (thirty) days.) 1 mL 1   meclizine (ANTIVERT) 25 MG tablet Take 1 tablet (25 mg total) by mouth 3 (three) times daily as needed for dizziness. 20 tablet 0   nitroGLYCERIN (NITROSTAT) 0.4 MG SL tablet Place 1 tablet (0.4 mg total) under the tongue every 5 (five) minutes x 3 doses as needed for chest pain. 25 tablet 12   pantoprazole (PROTONIX) 40 MG tablet Take 40 mg by mouth daily.     sertraline (ZOLOFT) 100 MG tablet Take 100 mg by mouth daily.     spironolactone (ALDACTONE) 25 MG tablet TAKE 1 TABLET (25 MG TOTAL) BY MOUTH DAILY. 90 tablet 1   Vitamin D, Ergocalciferol, (DRISDOL) 1.25 MG (50000 UNIT) CAPS capsule Take 50,000 Units by mouth once a week.     No current facility-administered medications on file prior to encounter.   REVIEW OF SYSTEMS:  Review of Systems  Respiratory:  Positive for shortness of breath.   Cardiovascular:  Positive for chest pain, palpitations and leg swelling.  Musculoskeletal:  Positive for myalgias.  Neurological:  Positive for tingling. Negative for dizziness.   PHYSICAL EXAMINATION:  Vitals:   11/01/23 1040  BP: 101/74  Pulse: 77  SpO2: 97%    Physical Exam Vitals reviewed.  Cardiovascular:     Rate and Rhythm: Normal rate.  Pulmonary:     Effort: Pulmonary effort is normal.  Musculoskeletal:        General: No tenderness.     Right lower leg: No edema.     Left lower leg: Edema (mild nonpitting edema) present.  Skin:    Findings: No bruising or erythema.   Villalta Score for Post-Thrombotic Syndrome: Pain: Mild Cramps: Mild Heaviness: Moderate Paresthesia: Mild Pruritus: Mild Pretibial Edema:  Mild Skin Induration: Absent Hyperpigmentation: Absent Redness: Absent Venous  Ectasia: Absent Pain on calf compression: Absent Villalta Preliminary Score: 7 Is venous ulcer present?: No If venous ulcer is present and score is <15, then 15 points total are assigned: Absent Villalta Total Score: 7  LABS:  CBC     Component Value Date/Time   WBC 6.7 10/29/2023 2018   RBC 4.46 10/29/2023 2018   HGB 12.1 10/29/2023 2018   HGB 12.9 01/22/2021 1319   HGB 13.6 08/02/2013 1447   HCT 35.8 (L) 10/29/2023 2018   HCT 38.2 01/22/2021 1319   PLT 215 10/29/2023 2018   PLT 236 01/22/2021 1319   MCV 80.3 10/29/2023 2018   MCV 83 01/22/2021 1319   MCH 27.1 10/29/2023 2018   MCHC 33.8 10/29/2023 2018   RDW 13.9 10/29/2023 2018   RDW 15.2 01/22/2021 1319   LYMPHSABS 2.0 10/26/2023 1307   MONOABS 0.5 10/26/2023 1307   EOSABS 0.2 10/26/2023 1307   BASOSABS 0.0 10/26/2023 1307    Hepatic Function      Component Value Date/Time   PROT 7.8 10/15/2023 0952   ALBUMIN 4.2 10/15/2023 0952   AST 19 10/15/2023 0952   ALT 24 10/15/2023 0952   ALKPHOS 74 10/15/2023 0952   BILITOT 0.6 10/15/2023 0952    Renal Function   Lab Results  Component Value Date   CREATININE 1.31 (H) 10/29/2023   CREATININE 1.08 (H) 10/29/2023   CREATININE 0.92 10/26/2023    Estimated Creatinine Clearance (by C-G formula based on SCr of 1.31 mg/dL (H)) Female: 16.1 mL/min (A) Female: 56.1 mL/min (A)   [facility] Vascular Lab Studies:  10/24/23 LLE ultrasound IMPRESSION: Positive for deep venous thrombosis isolated to the left anterior tibial vein.  ASSESSMENT: Location of DVT: Left distal vein Cause of DVT: unprovoked  Patient with first incidence of DVT with no known provoking risk factors. No need for any vascular surgery intervention. She is already established with hematology given her history of anemia and sickle cell trait, and she is now following with them for DVT. hypercoagulable workup initiated at hematology  appointment on 10/29/23 with next follow up 11/26/23. Will defer management of duration of anticoagulation to them pending further workup of unprovoked DVT. Provided refills of Eliquis for the patient today and discussed that future refills will need to come from hematology. No concerns related to medication access or adherence at this time. Discussed the importance of compression and elevation. All of the patient's questions have been answered.   Of note, patient has had multiple recent ED visits for chest pain and SOB. CT on 10/10/23 and 10/30/23 were both negative for PE. These symptoms appear to be unrelated to her DVT. Encouraged her to continue to follow up with cardiology for further workup and management.   PLAN: -Continue apixaban (Eliquis) 5 mg twice daily. -Expected duration of therapy: per hematology. Therapy started on 10/24/23. -Patient educated on purpose, proper use and potential adverse effects of apixaban (Eliquis). -Discussed importance of taking medication around the same time every day. -Advised patient of medications to avoid (NSAIDs, aspirin doses >100 mg daily). -Educated that Tylenol (acetaminophen) is the preferred analgesic to lower the risk of bleeding. -Advised patient to alert all providers of anticoagulation therapy prior to starting a new medication or having a procedure. -Emphasized importance of monitoring for signs and symptoms of bleeding (abnormal bruising, prolonged bleeding, nose bleeds, bleeding from gums, discolored urine, black tarry stools). -Educated patient to present to the ED if emergent signs and symptoms of new thrombosis occur. -Counseled patient to wear  compression stockings daily, removing at night. Encouraged elevation to help improve swelling.   Follow up: with hematology as planned. DVT Clinic available as needed.   Pervis Hocking, PharmD, [place], CPP Deep Vein Thrombosis Clinic Clinical Pharmacist Practitioner Office: [phone]   I have  evaluated the patient's chart/imaging and refer this patient to the Clinical Pharmacist Practitioner for medication management. I have reviewed the CPP's documentation and agree with her assessment and plan. I was immediately available during the visit for questions and collaboration.   Victorino Sparrow, MD

## 2023-10-29 NOTE — Assessment & Plan Note (Addendum)
 Recent diagnosis of left leg DVT.  Patient has no risk factors.  Unprovoked.No known family history of blood clots. No recent travel or surgeries. No hormonal pill use. Non-smoker.Currently on Eliquis. -Patient had colonoscopy in 2022.  Repeat was suggested in 10 years.  Recent mammogram in 2024 was negative and repeat scheduled for 1 year. -Continue Eliquis -Will do thrombophilia workup for unprovoked clot in a female -Patient will follow-up with GYN for Pap smear -Patient has upcoming appointment with vascular surgery to evaluate for other causes.  Return to clinic in 1 month

## 2023-10-29 NOTE — ED Provider Triage Note (Signed)
 Emergency Medicine Provider Triage Evaluation Note  Mary Pratt , a 53 y.o. adult  was evaluated in triage.  Pt complains of chest pain shortness of breath has been present for the past 2 weeks per patient she has been seen for this and ultimately discharged since has been persistent.  Patient states that she did have a DVT a week ago when she has been taking Eliquis for.  Patient also notes right sided neck/chest discomfort but cannot say what causes this chest pain normally makes it better.  Patient denies any vision changes.  Patient not endorsing any dizziness or lightheadedness.  Review of Systems  Positive:  Negative:   Physical Exam  BP (!) 146/101   Pulse 67   Temp (!) 97.2 F (36.2 C)   Resp 18   Ht 5\' 1"  (1.549 m)   Wt 75.2 kg   LMP 09/14/2022 (Approximate)   SpO2 100%   BMI 31.33 kg/m  Gen:   Awake, no distress   Resp:  Normal effort  MSK:   Moves extremities without difficulty  Other:  Lungs clear to auscultation bilaterally  Medical Decision Making  Medically screening exam initiated at 8:14 PM.  Appropriate orders placed.  Mary Pratt was informed that the remainder of the evaluation will be completed by another provider, this initial triage assessment does not replace that evaluation, and the importance of remaining in the ED until their evaluation is complete.  Workup initiated, patient had CTA done in the beginning month is normal and so will not repeat this out of triage, will order basic labs and imaging.   Netta Corrigan, PA-C 10/29/23 2015

## 2023-10-29 NOTE — Assessment & Plan Note (Signed)
 Hemoglobin electrophoresis consistent with sickle cell trait.  Patient is not anemic at this time. -Continue to monitor

## 2023-10-30 ENCOUNTER — Emergency Department (HOSPITAL_COMMUNITY): Payer: BC Managed Care – PPO

## 2023-10-30 LAB — TROPONIN I (HIGH SENSITIVITY): Troponin I (High Sensitivity): 5 ng/L (ref <18)

## 2023-10-30 MED ORDER — IOHEXOL 350 MG/ML SOLN
75.0000 mL | Freq: Once | INTRAVENOUS | Status: AC | PRN
  Administered 2023-10-30: 75 mL via INTRAVENOUS

## 2023-10-30 NOTE — Discharge Instructions (Signed)
 You do not have a clot in your lungs.  Your lungs look normal on CAT scan.  Multiple workups have not shown any problems with your heart.  This pain does not seem to be because of any of your heart problems.

## 2023-10-30 NOTE — ED Notes (Signed)
 ED Provider at bedside.

## 2023-10-30 NOTE — ED Provider Notes (Signed)
 [place] EMERGENCY DEPARTMENT AT [facility] Provider Note   CSN: [id number] Arrival date & time: 10/29/23  1832     History  Chief Complaint  Patient presents with   Chest Pain    Mary Pratt is a 53 y.o. adult.  Presents to the emergency department for evaluation of intermittent sharp pains in her chest.  Symptoms have been ongoing for a while.  She has been seen in the emergency department several times for this.  Pain is not continuous, does not completely resolve at times and then come back.  Not related to exertion.  When pain is severe it causes shortness of breath.       Home Medications Prior to Admission medications   Medication Sig Start Date End Date Taking? Authorizing Provider  APIXABAN Everlene Balls) VTE STARTER PACK (10MG  AND 5MG ) Take as directed on package: start with two-5mg  tablets twice daily for 7 days. On day 8, switch to one-5mg  tablet twice daily. 10/24/23   Carmel Sacramento A, PA-C  BREO ELLIPTA 200-25 MCG/ACT AEPB Inhale 1 puff into the lungs daily. 08/29/23   [provider]  carvedilol (COREG) 25 MG tablet TAKE 1 TABLET (25 MG TOTAL) BY MOUTH TWICE A DAY WITH MEALS 09/16/23   Antoine Poche, MD  clobetasol ointment (TEMOVATE) 0.05 % Apply 1 Application topically 2 (two) times daily. 10/26/23   [provider]  Cyanocobalamin (VITAMIN B-12 IJ) Inject as directed every 30 (thirty) days.    [provider]  diazepam (VALIUM) 2 MG tablet Take 1 tablet (2 mg total) by mouth every 8 (eight) hours as needed (dizziness). 10/26/23   Burgess Amor, PA-C  EPINEPHrine (EPIPEN 2-PAK) 0.3 mg/0.3 mL IJ SOAJ injection Inject 0.3 mg into the muscle as needed for anaphylaxis. 07/16/23   Alfonse Spruce, MD  fluocinonide ointment (LIDEX) 0.05 % Apply 1 Application topically daily as needed (eczema).    [provider]  fluticasone (FLONASE) 50 MCG/ACT nasal spray Place 2 sprays into both nostrils daily.    [provider]  furosemide (LASIX) 40 MG tablet Take 1 tablet (40 mg total) by mouth daily. 10/19/23   Sharlene Dory, NP  Galcanezumab-gnlm (EMGALITY) 120 MG/ML SOAJ INJECT 240 MG INTO THE SKIN ONCE FOR 1 DOSE. LOADING DOSE Patient taking differently: Inject 240 mg into the skin every 30 (thirty) days. 07/01/23   Drema Dallas, DO  meclizine (ANTIVERT) 25 MG tablet Take 1 tablet (25 mg total) by mouth 3 (three) times daily as needed for dizziness. 10/26/23   Burgess Amor, PA-C  nitroGLYCERIN (NITROSTAT) 0.4 MG SL tablet Place 1 tablet (0.4 mg total) under the tongue every 5 (five) minutes x 3 doses as needed for chest pain. 11/09/22   Duke Salvia, MD  pantoprazole (PROTONIX) 40 MG tablet Take 40 mg by mouth daily.    [provider]  sacubitril-valsartan (ENTRESTO) 24-26 MG TAKE 1 TABLET BY MOUTH TWICE A DAY 09/16/23   Antoine Poche, MD  sertraline (ZOLOFT) 100 MG tablet Take 100 mg by mouth daily. 01/24/20   [provider]  spironolactone (ALDACTONE) 25 MG tablet TAKE 1 TABLET (25 MG TOTAL) BY MOUTH DAILY. 12/03/22   Antoine Poche, MD  Vitamin D, Ergocalciferol, (DRISDOL) 1.25 MG (50000 UNIT) CAPS capsule Take 50,000 Units by mouth once a week. 10/26/23   [provider]      Allergies    Clarithromycin, Amoxicillin, Meloxicam, Spiriva respimat [tiotropium bromide monohydrate], and Symbicort [budesonide-formoterol fumarate]  Review of Systems   Review of Systems  Physical Exam Updated Vital Signs BP 124/84   Pulse 79   Temp 98.9 F (37.2 C) (Oral)   Resp 18   Ht 5\' 1"  (1.549 m)   Wt 75.2 kg   LMP 09/14/2022 (Approximate)   SpO2 99%   BMI 31.33 kg/m  Physical Exam Vitals and nursing note reviewed.  Constitutional:      General: She is not in acute distress.    Appearance: She is well-developed.  HENT:     Head: Normocephalic and atraumatic.     Mouth/Throat:     Mouth: Mucous membranes are moist.  Eyes:     General: Vision grossly intact.  Gaze aligned appropriately.     Extraocular Movements: Extraocular movements intact.     Conjunctiva/sclera: Conjunctivae normal.  Neck:   Cardiovascular:     Rate and Rhythm: Normal rate and regular rhythm.     Pulses: Normal pulses.     Heart sounds: Normal heart sounds, S1 normal and S2 normal. No murmur heard.    No friction rub. No gallop.  Pulmonary:     Effort: Pulmonary effort is normal. No respiratory distress.     Breath sounds: Normal breath sounds.  Abdominal:     General: Bowel sounds are normal.     Palpations: Abdomen is soft.     Tenderness: There is no abdominal tenderness. There is no guarding or rebound.     Hernia: No hernia is present.  Musculoskeletal:        General: No swelling.     Cervical back: Normal range of motion and neck supple. Pain with movement and muscular tenderness present. No spinous process tenderness. Normal range of motion.     Right lower leg: No edema.     Left lower leg: No edema.  Skin:    General: Skin is warm and dry.     Capillary Refill: Capillary refill takes less than 2 seconds.     Findings: No ecchymosis, erythema, rash or wound.  Neurological:     General: No focal deficit present.     Mental Status: She is alert and oriented to person, place, and time.     GCS: GCS eye subscore is 4. GCS verbal subscore is 5. GCS motor subscore is 6.     Cranial Nerves: Cranial nerves 2-12 are intact.     Sensory: Sensation is intact.     Motor: Motor function is intact.     Coordination: Coordination is intact.  Psychiatric:        Attention and Perception: Attention normal.        Mood and Affect: Mood normal.        Speech: Speech normal.        Behavior: Behavior normal.     ED Results / Procedures / Treatments   Labs (all labs ordered are listed, but only abnormal results are displayed) Labs Reviewed  BASIC METABOLIC PANEL - Abnormal; Notable for the following components:      Result Value   Chloride 96 (*)    Creatinine,  Ser 1.31 (*)    GFR, Estimated 48 (*)    All other components within normal limits  CBC - Abnormal; Notable for the following components:   HCT 35.8 (*)    All other components within normal limits  BRAIN NATRIURETIC PEPTIDE  TROPONIN I (HIGH SENSITIVITY)  TROPONIN I (HIGH SENSITIVITY)    EKG None  Radiology CT Angio Chest Pulmonary  Embolism (PE) W or WO Contrast Result Date: 10/30/2023 CLINICAL DATA:  Pulmonary embolism (PE) suspected, high prob recent diagnosis DVT (10/24/2023). Chest pain EXAM: CT ANGIOGRAPHY CHEST WITH CONTRAST TECHNIQUE: Multidetector CT imaging of the chest was performed using the standard protocol during bolus administration of intravenous contrast. Multiplanar CT image reconstructions and MIPs were obtained to evaluate the vascular anatomy. RADIATION DOSE REDUCTION: This exam was performed according to the departmental dose-optimization program which includes automated exposure control, adjustment of the mA and/or kV according to patient size and/or use of iterative reconstruction technique. CONTRAST:  75mL OMNIPAQUE IOHEXOL 350 MG/ML SOLN COMPARISON:  10/10/2023 FINDINGS: Cardiovascular: Heart is normal size. Aorta is normal caliber. Left ICD remains in place unchanged. No filling defects in the pulmonary arteries to suggest pulmonary emboli. Mediastinum/Nodes: No mediastinal, hilar, or axillary adenopathy. Trachea and esophagus are unremarkable. Thyroid unremarkable. Lungs/Pleura: Linear bibasilar subsegmental atelectasis. No confluent opacities or effusions. Upper Abdomen: No acute findings Musculoskeletal: Chest wall soft tissues are unremarkable. No acute bony abnormality. Review of the MIP images confirms the above findings. IMPRESSION: No evidence of pulmonary embolus Bibasilar atelectasis. No acute cardiopulmonary disease. Electronically Signed   By: Charlett Nose M.D.   On: 10/30/2023 03:35   DG Chest 2 View Result Date: 10/29/2023 CLINICAL DATA:  Short of breath,  chest pain, history of DVT EXAM: CHEST - 2 VIEW COMPARISON:  10/10/2023 FINDINGS: Frontal and lateral views of the chest demonstrate stable multi lead pacer/AICD. Cardiac silhouette is unremarkable. No acute airspace disease, effusion, or pneumothorax. No acute bony abnormalities. IMPRESSION: 1. Stable chest, no acute process. Electronically Signed   By: Sharlet Salina M.D.   On: 10/29/2023 20:37    Procedures Procedures    Medications Ordered in ED Medications  iohexol (OMNIPAQUE) 350 MG/ML injection 75 mL (75 mLs Intravenous Contrast Given 10/30/23 0327)    ED Course/ Medical Decision Making/ A&P                                 Medical Decision Making Amount and/or Complexity of Data Reviewed Labs: ordered. Radiology: ordered.  Risk Prescription drug management.   Differential Diagnosis considered includes, but not limited to: STEMI; NSTEMI; myocarditis; pericarditis; pulmonary embolism; aortic dissection; pneumothorax; pneumonia; gastritis; musculoskeletal pain  Patient presents for evaluation of chest pain.  She has had multiple emergency department workups for this over the last month or so.  Reviewing her records reveals that she does have a history of nonischemic cardiomyopathy.  Heart cath in 2016 showed normal coronary arteries.  This was done at the time that she was diagnosed with congestive heart failure with ejection fraction of 30%.  Patient has had an ICD placed.  She has had continuous cardiology follow-up over the years and most recent ejection fraction was 50 to 55% in 2022.  She had a nuclear medicine stress test at that time that did not show evidence of ischemia.  Patient's troponins are negative.  Patient was seen in the emergency department on 2016 with leg pain and was diagnosed with a DVT.  She has been on Eliquis since then.  Discussed risks and benefits of CT angiography with the patient.  Clinical significance of a small PE would not be clear, having just  started the Eliquis.  She was nervous and wanted to pursue CT to rule out clot.  This has been performed and does not show evidence of PE.  Patient reassured.  Chest  pain is felt to be noncardiac in origin.  She has been in contact with her cardiology office, recommend follow-up with them.        Final Clinical Impression(s) / ED Diagnoses Final diagnoses:  Nonspecific chest pain    Rx / DC Orders ED Discharge Orders     None         Blakelee Allington, Canary Brim, MD 10/30/23 [phone]

## 2023-10-31 ENCOUNTER — Other Ambulatory Visit: Payer: Self-pay | Admitting: Cardiology

## 2023-10-31 LAB — BETA-2-GLYCOPROTEIN I ABS, IGG/M/A
Beta-2 Glyco I IgG: <9 GPI IgG units (ref 0–20)
Beta-2-Glycoprotein I IgA: <9 GPI IgA units (ref 0–25)
Beta-2-Glycoprotein I IgM: <9 GPI IgM units (ref 0–32)

## 2023-10-31 LAB — DRVVT CONFIRM: dRVVT Confirm: 1.6 ratio — ABNORMAL HIGH (ref 0.8–1.2)

## 2023-10-31 LAB — LUPUS ANTICOAGULANT PANEL
DRVVT: 154 s — ABNORMAL HIGH (ref 0.0–47.0)
PTT Lupus Anticoagulant: 65.6 s — ABNORMAL HIGH (ref 0.0–43.5)

## 2023-10-31 LAB — CARDIOLIPIN ANTIBODIES, IGG, IGM, IGA
Anticardiolipin IgA: <9 U/mL (ref 0–11)
Anticardiolipin IgG: <9 GPL U/mL (ref 0–14)
Anticardiolipin IgM: 12 [MPL'U]/mL (ref 0–12)

## 2023-10-31 LAB — PROTEIN S ACTIVITY: Protein S Activity: 117 % (ref 63–140)

## 2023-10-31 LAB — PROTEIN C ACTIVITY: Protein C Activity: 177 % (ref 73–180)

## 2023-10-31 LAB — HEXAGONAL PHASE PHOSPHOLIPID: Hexagonal Phase Phospholipid: 12 s — ABNORMAL HIGH (ref 0–11)

## 2023-10-31 LAB — PTT-LA MIX: PTT-LA Mix: 52.1 s — ABNORMAL HIGH (ref 0.0–40.5)

## 2023-10-31 LAB — DRVVT MIX: dRVVT Mix: 79.6 s — ABNORMAL HIGH (ref 0.0–40.4)

## 2023-11-01 ENCOUNTER — Encounter (HOSPITAL_COMMUNITY): Payer: Self-pay | Admitting: Student-PharmD

## 2023-11-01 ENCOUNTER — Ambulatory Visit (HOSPITAL_COMMUNITY)
Admission: RE | Admit: 2023-11-01 | Discharge: 2023-11-01 | Disposition: A | Discharge status: Home / Self Care | Payer: BC Managed Care – PPO | Source: Ambulatory Visit | Attending: Vascular Surgery | Admitting: Vascular Surgery

## 2023-11-01 VITALS — BP 101/74 | HR 77

## 2023-11-01 DIAGNOSIS — I82442 Acute embolism and thrombosis of left tibial vein: Secondary | ICD-10-CM | POA: Diagnosis present

## 2023-11-01 MED ORDER — APIXABAN 5 MG PO TABS: 5.0000 mg | ORAL_TABLET | Freq: Two times a day (BID) | ORAL | 4 refills | Status: AC

## 2023-11-01 NOTE — Patient Instructions (Signed)
-  Continue apixaban (Eliquis) 5 mg twice daily. -Your refills have been sent to [facility] in [place]. You may need to call the pharmacy to ask them to fill this when you start to run low on your current supply.  -We recommend you wear compression stockings (15-20 mmHg) as long as you are having swelling or pain. Be sure to purchase the correct size and take them off at night.  -It is important to take your medication around the same time every day.  -Avoid NSAIDs like ibuprofen (Advil, Motrin) and naproxen (Aleve) as well as aspirin doses over 100 mg daily. -Tylenol (acetaminophen) is the preferred over the counter pain medication to lower the risk of bleeding. -Be sure to alert all of your health care providers that you are taking an anticoagulant prior to starting a new medication or having a procedure. -Monitor for signs and symptoms of bleeding (abnormal bruising, prolonged bleeding, nose bleeds, bleeding from gums, discolored urine, black tarry stools). If you have fallen and hit your head OR if your bleeding is severe or not stopping, seek emergency care.  -Go to the emergency room if emergent signs and symptoms of new clot occur (new or worse swelling and pain in an arm or leg, shortness of breath, chest pain, fast or irregular heartbeats, lightheadedness, dizziness, fainting, coughing up blood) or if you experience a significant color change (pale or blue) in the extremity that has the DVT.   If you have any questions or need to reschedule an appointment, please call [phone] [facility].  If you are having an emergency, call 911 or present to the nearest emergency room.   What is a DVT?  -Deep vein thrombosis (DVT) is a condition in which a blood clot forms in a vein of the deep venous system which can occur in the lower leg, thigh, pelvis, arm, or neck. This condition is serious and can be life-threatening if the clot travels to the arteries of the lungs and causing a blockage (pulmonary embolism,  PE). A DVT can also damage veins in the leg, which can lead to long-term venous disease, leg pain, swelling, discoloration, and ulcers or sores (post-thrombotic syndrome).  -Treatment may include taking an anticoagulant medication to prevent more clots from forming and the current clot from growing, wearing compression stockings, and/or surgical procedures to remove or dissolve the clot.

## 2023-11-03 LAB — FACTOR 5 LEIDEN

## 2023-11-04 ENCOUNTER — Ambulatory Visit: Payer: BC Managed Care – PPO | Attending: Nurse Practitioner

## 2023-11-04 DIAGNOSIS — R0602 Shortness of breath: Secondary | ICD-10-CM

## 2023-11-04 DIAGNOSIS — I5022 Chronic systolic (congestive) heart failure: Secondary | ICD-10-CM | POA: Diagnosis not present

## 2023-11-04 LAB — PROTHROMBIN GENE MUTATION

## 2023-11-05 LAB — ECHOCARDIOGRAM COMPLETE
AR max vel: 2.34 cm2
AV Area VTI: 2.23 cm2
AV Area mean vel: 2.41 cm2
AV Mean grad: 4 mmHg
AV Peak grad: 7.6 mmHg
Ao pk vel: 1.38 m/s
Area-P 1/2: 3.53 cm2
MV VTI: 3.47 cm2
S' Lateral: 3 cm

## 2023-11-08 ENCOUNTER — Encounter: Payer: Self-pay | Admitting: Oncology

## 2023-11-09 ENCOUNTER — Other Ambulatory Visit: Payer: Self-pay | Admitting: Nurse Practitioner

## 2023-11-09 ENCOUNTER — Telehealth: Payer: Self-pay | Admitting: *Deleted

## 2023-11-09 ENCOUNTER — Inpatient Hospital Stay: Attending: Oncology | Admitting: Oncology

## 2023-11-09 VITALS — BP 117/80 | HR 70 | Temp 98.3°F | Resp 18 | Wt 161.9 lb

## 2023-11-09 DIAGNOSIS — I5022 Chronic systolic (congestive) heart failure: Secondary | ICD-10-CM

## 2023-11-09 DIAGNOSIS — I82492 Acute embolism and thrombosis of other specified deep vein of left lower extremity: Secondary | ICD-10-CM | POA: Diagnosis not present

## 2023-11-09 DIAGNOSIS — G479 Sleep disorder, unspecified: Secondary | ICD-10-CM | POA: Diagnosis not present

## 2023-11-09 DIAGNOSIS — E538 Deficiency of other specified B group vitamins: Secondary | ICD-10-CM | POA: Diagnosis not present

## 2023-11-09 DIAGNOSIS — Z7901 Long term (current) use of anticoagulants: Secondary | ICD-10-CM | POA: Insufficient documentation

## 2023-11-09 DIAGNOSIS — I8290 Acute embolism and thrombosis of unspecified vein: Secondary | ICD-10-CM | POA: Diagnosis not present

## 2023-11-09 DIAGNOSIS — E611 Iron deficiency: Secondary | ICD-10-CM | POA: Diagnosis not present

## 2023-11-09 DIAGNOSIS — I82402 Acute embolism and thrombosis of unspecified deep veins of left lower extremity: Secondary | ICD-10-CM | POA: Diagnosis not present

## 2023-11-09 DIAGNOSIS — G4710 Hypersomnia, unspecified: Secondary | ICD-10-CM | POA: Insufficient documentation

## 2023-11-09 DIAGNOSIS — D573 Sickle-cell trait: Secondary | ICD-10-CM | POA: Insufficient documentation

## 2023-11-09 DIAGNOSIS — G4700 Insomnia, unspecified: Secondary | ICD-10-CM | POA: Insufficient documentation

## 2023-11-09 DIAGNOSIS — D6862 Lupus anticoagulant syndrome: Secondary | ICD-10-CM | POA: Diagnosis not present

## 2023-11-09 MED ORDER — FUROSEMIDE 40 MG PO TABS: 20.0000 mg | ORAL_TABLET | Freq: Every day | ORAL | Status: DC

## 2023-11-09 MED ORDER — FERROUS SULFATE 325 (65 FE) MG PO TBEC: 325.0000 mg | DELAYED_RELEASE_TABLET | ORAL | 3 refills | Status: DC

## 2023-11-09 NOTE — Patient Instructions (Signed)
 VISIT SUMMARY:  During today's visit, we discussed your ongoing symptoms of numbness in your toes, chest pains, fatigue, and tingling in your feet. We reviewed your recent lab results and visits to specialists, and we have made some adjustments to your treatment plan. We also addressed your concerns about your positive lupus test and your current anticoagulation therapy.  YOUR PLAN:  -NUMBNESS IN TOES AND FATIGUE: Your numbness and fatigue may be related to your spinal disc issue, despite your B12 and iron levels being within normal ranges. We will start you on iron supplements every other day .  -POSITIVE LUPUS TEST: Your positive lupus test is likely a false positive due to your medication, Eliquis. Lupus is an autoimmune disease that can cause inflammation and pain in various parts of the body. We will repeat the lupus test in six months, ensuring you stop taking Eliquis for three days before the test.  -ANTICOAGULATION FOR CLOT: You will continue taking Eliquis to prevent blood clots. Since no hereditary reasons for your clot were found, we will perform a D-dimer test at your six-month follow-up. If the test is negative, we may consider reducing your Eliquis dose.  -GENERAL HEALTH MAINTENANCE: Please follow up on your sleep study to address your fatigue. Your current lab results do not suggest any signs of cancer, so there is no immediate concern in that regard.  INSTRUCTIONS:  Please start taking your iron supplements every other day as prescribed. We will repeat your lupus test in six months, so remember to stop taking Eliquis three days before the test. Continue taking Eliquis as directed, and we will perform a D-dimer test at your six-month follow-up to evaluate your need for ongoing anticoagulation therapy. Additionally, please follow up on your sleep study to help address your fatigue.

## 2023-11-09 NOTE — Telephone Encounter (Signed)
 Received telephone call from patient requesting a referral to [facility] for a second opinion.  Dr. Anders Simmonds made aware and referral sent.

## 2023-11-10 ENCOUNTER — Encounter: Payer: Self-pay | Admitting: Oncology

## 2023-11-10 NOTE — Assessment & Plan Note (Signed)
 Patient has mild vitamin B12 deficiency.  Getting monthly injections with primary care. -Continue care with primary care

## 2023-11-10 NOTE — Progress Notes (Signed)
 [facility] at [facility]  HEMATOLOGY FOLLOW-UP VISIT  Mary Chimera, MD  REASON FOR FOLLOW-UP: Vitamin B12 deficiency, unprovoked venous thromboembolism  ASSESSMENT & PLAN:  Patient is a 53 year old female following for vitamin B12 deficiency and unprovoked venous thromboembolism  DVT (deep venous thrombosis) (HCC) Recent diagnosis of left leg DVT.  Patient has no risk factors.  Unprovoked.No known family history of blood clots. No recent travel or surgeries. No hormonal pill use. Non-smoker. Currently on Eliquis. -Anticardiolipin antibody, antibeta-2 glycoprotein antibody, factor V Leyden, prothrombin gene mutation: Negative -Lupus anticoagulant: Positive, this was done while on Eliquis and can be false positive.  Will repeat this test at 48-month mark after holding Eliquis for 3 days.   -Patient had colonoscopy in 2022.  Repeat was suggested in 10 years.  Recent mammogram in 2024 was negative and repeat scheduled for 1 year. -Continue Eliquis.  Patient understands that she needs anticoagulation for lifetime but at 8-month mark can repeat a D-dimer and consider decreasing the dose of Eliquis. -Patient will follow-up with GYN for Pap smear  Return to clinic in 3 months with d dimer  Vitamin B12 deficiency Patient has mild vitamin B12 deficiency.  Getting monthly injections with primary care. -Continue care with primary care  Sleep disturbance Patient reports excessive daytime sleepiness, insomnia.  She reports not able to lay down and has orthopnea. -Referred for sleep study  Sickle cell trait (HCC) Hemoglobin electrophoresis consistent with sickle cell trait.  Patient is not anemic at this time. -Continue to monitor  Iron deficiency Patient has very mild iron deficiency with no anemia at this time. -Start over-the-counter iron supplements every other day. -Increase proteins and greens in diet.    Orders Placed This Encounter  Procedures    D-dimer, quantitative    Standing Status:   Future    Expected Date:   05/11/2024    Expiration Date:   11/08/2024   Patient requested a second opinion with hematology at [facility].  Will refer her for the same.   The total time spent in the appointment was 20 minutes encounter with patients including review of chart and various tests results, discussions about plan of care and coordination of care plan   All questions were answered. The patient knows to call the clinic with any problems, questions or concerns. No barriers to learning was detected.  Cindie Crumbly, MD 3/5/20255:10 PM   SUMMARY OF HEMATOLOGIC HISTORY: 1st episode: 10/24/23 - Left leg DVT: Unprovoked Risk factors: None Current treatment: Eliquis 5mg  BID -Anticardiolipin antibody, antibeta 2 glycoprotein antibody: Negative -Lupus anticoagulant: Positive, but tested while on Eliquis. -Prothrombin gene, factor V Leyden: Negative   INTERVAL HISTORY: Mary Pratt 53 y.o. adult following for vitamin B12 deficiency and unprovoked venous thromboembolism.  Patient continues to feel poorly with ongoing numbness in her toes, chest pains and fatigue. She reports feeling like her blood is not circulating properly and experiencing 'pulling' in her legs. Despite these symptoms, a recent visit to a vascular surgeon and various lab tests have not identified any issues.  The patient also reports feeling tired all the time and sleeping a lot. Despite this, her B12 levels are normal and she does not have a vitamin B12 deficiency. She has not yet started taking iron pills as previously discussed, but a prescription will be sent for these. Her iron levels are borderline low, but not too low.  Patient does have a history of slipped disc that might be contributing  to numbness and tingling in her feet.  She has an appointment with her spine physician to follow-up.  I have reviewed the past medical history, past surgical history,  social history and family history with the patient   ALLERGIES:  is allergic to clarithromycin, amoxicillin, meloxicam, spiriva respimat [tiotropium bromide monohydrate], and symbicort [budesonide-formoterol fumarate].  MEDICATIONS:  Current Outpatient Medications  Medication Sig Dispense Refill   apixaban (ELIQUIS) 5 MG TABS tablet Take 1 tablet (5 mg total) by mouth 2 (two) times daily. Start taking after completion of starter pack. 60 tablet 4   APIXABAN (ELIQUIS) VTE STARTER PACK (10MG  AND 5MG ) Take as directed on package: start with two-5mg  tablets twice daily for 7 days. On day 8, switch to one-5mg  tablet twice daily. 74 each 0   BREO ELLIPTA 200-25 MCG/ACT AEPB Inhale 1 puff into the lungs daily.     carvedilol (COREG) 25 MG tablet TAKE 1 TABLET (25 MG TOTAL) BY MOUTH TWICE A DAY WITH MEALS 60 tablet 0   clobetasol ointment (TEMOVATE) 0.05 % Apply 1 Application topically 2 (two) times daily.     Cyanocobalamin (VITAMIN B-12 IJ) Inject as directed every 30 (thirty) days.     diazepam (VALIUM) 2 MG tablet Take 1 tablet (2 mg total) by mouth every 8 (eight) hours as needed (dizziness). 20 tablet 0   ENTRESTO 24-26 MG TAKE 1 TABLET BY MOUTH TWICE A DAY 60 tablet 0   EPINEPHrine (EPIPEN 2-PAK) 0.3 mg/0.3 mL IJ SOAJ injection Inject 0.3 mg into the muscle as needed for anaphylaxis. 2 each 1   ferrous sulfate 325 (65 FE) MG EC tablet Take 1 tablet (325 mg total) by mouth every other day. 45 tablet 3   fluocinonide ointment (LIDEX) 0.05 % Apply 1 Application topically daily as needed (eczema).     fluticasone (FLONASE) 50 MCG/ACT nasal spray Place 2 sprays into both nostrils daily.     furosemide (LASIX) 40 MG tablet Take 0.5 tablets (20 mg total) by mouth daily.     Galcanezumab-gnlm (EMGALITY) 120 MG/ML SOAJ INJECT 240 MG INTO THE SKIN ONCE FOR 1 DOSE. LOADING DOSE (Patient taking differently: Inject 240 mg into the skin every 30 (thirty) days.) 1 mL 1   meclizine (ANTIVERT) 25 MG tablet Take 1  tablet (25 mg total) by mouth 3 (three) times daily as needed for dizziness. 20 tablet 0   nitroGLYCERIN (NITROSTAT) 0.4 MG SL tablet Place 1 tablet (0.4 mg total) under the tongue every 5 (five) minutes x 3 doses as needed for chest pain. 25 tablet 12   pantoprazole (PROTONIX) 40 MG tablet Take 40 mg by mouth daily.     sertraline (ZOLOFT) 100 MG tablet Take 100 mg by mouth daily.     spironolactone (ALDACTONE) 25 MG tablet TAKE 1 TABLET (25 MG TOTAL) BY MOUTH DAILY. 90 tablet 1   Vitamin D, Ergocalciferol, (DRISDOL) 1.25 MG (50000 UNIT) CAPS capsule Take 50,000 Units by mouth once a week.     No current facility-administered medications for this visit.     REVIEW OF SYSTEMS:   Constitutional: Denies fevers, chills or night sweats Eyes: Denies blurriness of vision Ears, nose, mouth, throat, and face: Denies mucositis or sore throat Respiratory: Denies cough, dyspnea or wheezes Cardiovascular: Denies palpitation, chest discomfort or lower extremity swelling Gastrointestinal:  Denies nausea, heartburn or change in bowel habits Skin: Denies abnormal skin rashes Lymphatics: Denies new lymphadenopathy or easy bruising Neurological:Denies numbness, tingling or new weaknesses Behavioral/Psych: Mood is stable, no  new changes  All other systems were reviewed with the patient and are negative.  PHYSICAL EXAMINATION:   Vitals:   11/09/23 1126  BP: 117/80  Pulse: 70  Resp: 18  Temp: 98.3 F (36.8 C)  SpO2: 96%    GENERAL:alert, no distress and comfortable LUNGS: clear to auscultation and percussion with normal breathing effort HEART: regular rate & rhythm and no murmurs and no lower extremity edema ABDOMEN:abdomen soft, non-tender and normal bowel sounds Musculoskeletal:no cyanosis of digits and no clubbing  NEURO: alert & oriented x 3 with fluent speech  LABORATORY DATA:  I have reviewed the data as listed  Lab Results  Component Value Date   WBC 6.7 10/29/2023   NEUTROABS 3.4  10/26/2023   HGB 12.1 10/29/2023   HCT 35.8 (L) 10/29/2023   MCV 80.3 10/29/2023   PLT 215 10/29/2023       Chemistry      Component Value Date/Time   NA 135 10/29/2023 2018   NA 139 11/10/2022 1409   K 4.1 10/29/2023 2018   CL 96 (L) 10/29/2023 2018   CO2 27 10/29/2023 2018   BUN 13 10/29/2023 2018   BUN 10 11/10/2022 1409   CREATININE 1.31 (H) 10/29/2023 2018   CREATININE 0.91 01/22/2016 1308      Component Value Date/Time   CALCIUM 9.5 10/29/2023 2018   ALKPHOS 74 10/15/2023 0952   AST 19 10/15/2023 0952   ALT 24 10/15/2023 0952   BILITOT 0.6 10/15/2023 0952      Latest Reference Range & Units 10/15/23 09:52  Iron 28 - 170 ug/dL 65  UIBC ug/dL 578  TIBC 469 - 629 ug/dL 528  Saturation Ratios 10.4 - 31.8 % 17  Ferritin 11 - 307 ng/mL 26  Folate >5.9 ng/mL 15.8  Methylmalonic Acid, Quantitative 0 - 378 nmol/L 221  Vitamin B1 (Thiamine) 66.5 - 200.0 nmol/L 86.6  Vitamin B12 180 - 914 pg/mL 360    Latest Reference Range & Units 10/15/23 09:52  Hgb A 96.4 - 98.8 % 58.3 (L)  Hgb A2 1.8 - 3.2 % 2.7  HGB F 0.0 - 2.0 % 0.0  HGB S 0.0 % 39.0 (H)  Hgb Solubility Negative  Positive !  Interpretation, Hgb Fract  Hemoglobin pattern and concentrations are consistent with  sickle cell trait (heterozygous).   (L): Data is abnormally low (H): Data is abnormally high !: Data is abnormal  Latest Reference Range & Units 10/22/23 23:12  D-Dimer, Quant 0.00 - 0.50 ug/mL-FEU 1.50 (H)  (H): Data is abnormally high   Latest Reference Range & Units 10/29/23 08:41  Anticardiolipin Ab,IgA,Qn 0 - 11 APL U/mL <9  Anticardiolipin Ab,IgG,Qn 0 - 14 GPL U/mL <9  Anticardiolipin Ab,IgM,Qn 0 - 12 MPL U/mL 12  PTT Lupus Anticoagulant 0.0 - 43.5 sec 65.6 (H)  DRVVT 0.0 - 47.0 sec 154.0 (H)  Lupus Anticoag Interp  Comment: (C)  Hexagonal Phase Phospholipid 0 - 11 sec 12 (H)  PTT-LA Mix 0.0 - 40.5 sec 52.1 (H)  Beta-2 Glycoprotein I Ab, IgG 0 - 20 GPI IgG units <9  Beta-2-Glycoprotein I  IgA 0 - 25 GPI IgA units <9  Beta-2-Glycoprotein I IgM 0 - 32 GPI IgM units <9  (H): Data is abnormally high (C): Corrected  PT gene mutation, factor V Leyden: Negative

## 2023-11-10 NOTE — Assessment & Plan Note (Signed)
 Recent diagnosis of left leg DVT.  Patient has no risk factors.  Unprovoked.No known family history of blood clots. No recent travel or surgeries. No hormonal pill use. Non-smoker. Currently on Eliquis. -Anticardiolipin antibody, antibeta-2 glycoprotein antibody, factor V Leyden, prothrombin gene mutation: Negative -Lupus anticoagulant: Positive, this was done while on Eliquis and can be false positive.  Will repeat this test at 15-month mark after holding Eliquis for 3 days.   -Patient had colonoscopy in 2022.  Repeat was suggested in 10 years.  Recent mammogram in 2024 was negative and repeat scheduled for 1 year. -Continue Eliquis.  Patient understands that she needs anticoagulation for lifetime but at 28-month mark can repeat a D-dimer and consider decreasing the dose of Eliquis. -Patient will follow-up with GYN for Pap smear  Return to clinic in 3 months with d dimer

## 2023-11-10 NOTE — Assessment & Plan Note (Signed)
 Hemoglobin electrophoresis consistent with sickle cell trait.  Patient is not anemic at this time. -Continue to monitor

## 2023-11-10 NOTE — Assessment & Plan Note (Signed)
 Patient has very mild iron deficiency with no anemia at this time. -Start over-the-counter iron supplements every other day. -Increase proteins and greens in diet.

## 2023-11-10 NOTE — Assessment & Plan Note (Signed)
 Patient reports excessive daytime sleepiness, insomnia.  She reports not able to lay down and has orthopnea. -Referred for sleep study

## 2023-11-11 ENCOUNTER — Ambulatory Visit (HOSPITAL_COMMUNITY): Payer: BC Managed Care – PPO

## 2023-11-12 ENCOUNTER — Other Ambulatory Visit (HOSPITAL_COMMUNITY)
Admission: RE | Admit: 2023-11-12 | Discharge: 2023-11-12 | Disposition: A | Discharge status: Home / Self Care | Payer: Self-pay | Source: Ambulatory Visit | Attending: Medical Genetics | Admitting: Medical Genetics

## 2023-11-12 ENCOUNTER — Ambulatory Visit (INDEPENDENT_AMBULATORY_CARE_PROVIDER_SITE_OTHER): Payer: Self-pay

## 2023-11-12 DIAGNOSIS — J309 Allergic rhinitis, unspecified: Secondary | ICD-10-CM

## 2023-11-21 ENCOUNTER — Other Ambulatory Visit: Payer: Self-pay | Admitting: Cardiology

## 2023-11-21 DIAGNOSIS — I5022 Chronic systolic (congestive) heart failure: Secondary | ICD-10-CM

## 2023-11-22 LAB — GENECONNECT MOLECULAR SCREEN: Genetic Analysis Overall Interpretation: NEGATIVE

## 2023-11-22 NOTE — Progress Notes (Signed)
 Remote ICD transmission.

## 2023-11-23 ENCOUNTER — Encounter: Payer: Self-pay | Admitting: Internal Medicine

## 2023-11-24 ENCOUNTER — Ambulatory Visit (INDEPENDENT_AMBULATORY_CARE_PROVIDER_SITE_OTHER): Payer: Self-pay

## 2023-11-24 ENCOUNTER — Other Ambulatory Visit (HOSPITAL_COMMUNITY)
Admission: RE | Admit: 2023-11-24 | Discharge: 2023-11-24 | Disposition: A | Discharge status: Home / Self Care | Source: Ambulatory Visit | Attending: Nurse Practitioner | Admitting: Nurse Practitioner

## 2023-11-24 DIAGNOSIS — I5022 Chronic systolic (congestive) heart failure: Secondary | ICD-10-CM | POA: Diagnosis present

## 2023-11-24 DIAGNOSIS — J309 Allergic rhinitis, unspecified: Secondary | ICD-10-CM | POA: Diagnosis not present

## 2023-11-24 LAB — BASIC METABOLIC PANEL
Anion gap: 10 (ref 5–15)
BUN: 12 mg/dL (ref 6–20)
CO2: 25 mmol/L (ref 22–32)
Calcium: 9.4 mg/dL (ref 8.9–10.3)
Chloride: 101 mmol/L (ref 98–111)
Creatinine, Ser: 1.03 mg/dL — ABNORMAL HIGH (ref 0.44–1.00)
GFR, Estimated: >60 mL/min (ref >60)
Glucose, Bld: 104 mg/dL — ABNORMAL HIGH (ref 70–99)
Potassium: 3.5 mmol/L (ref 3.5–5.1)
Sodium: 136 mmol/L (ref 135–145)

## 2023-11-26 ENCOUNTER — Inpatient Hospital Stay: Payer: BC Managed Care – PPO | Admitting: Oncology

## 2023-11-30 ENCOUNTER — Ambulatory Visit: Payer: BC Managed Care – PPO | Attending: Nurse Practitioner | Admitting: Nurse Practitioner

## 2023-11-30 ENCOUNTER — Other Ambulatory Visit: Payer: Self-pay | Admitting: Cardiology

## 2023-11-30 ENCOUNTER — Encounter: Payer: Self-pay | Admitting: Nurse Practitioner

## 2023-11-30 VITALS — BP 120/78 | HR 77 | Ht 61.0 in | Wt 166.2 lb

## 2023-11-30 DIAGNOSIS — I5022 Chronic systolic (congestive) heart failure: Secondary | ICD-10-CM

## 2023-11-30 DIAGNOSIS — I428 Other cardiomyopathies: Secondary | ICD-10-CM | POA: Diagnosis not present

## 2023-11-30 DIAGNOSIS — I10 Essential (primary) hypertension: Secondary | ICD-10-CM

## 2023-11-30 DIAGNOSIS — Z95810 Presence of automatic (implantable) cardiac defibrillator: Secondary | ICD-10-CM

## 2023-11-30 DIAGNOSIS — I5032 Chronic diastolic (congestive) heart failure: Secondary | ICD-10-CM | POA: Diagnosis not present

## 2023-11-30 DIAGNOSIS — R0602 Shortness of breath: Secondary | ICD-10-CM | POA: Diagnosis not present

## 2023-11-30 DIAGNOSIS — I824Y2 Acute embolism and thrombosis of unspecified deep veins of left proximal lower extremity: Secondary | ICD-10-CM

## 2023-11-30 MED ORDER — FUROSEMIDE 40 MG PO TABS: 40.0000 mg | ORAL_TABLET | Freq: Every day | ORAL | 5 refills | Status: DC

## 2023-11-30 NOTE — Progress Notes (Addendum)
 Cardiology Office Note:   Date: 11/30/2023 ID:  Mary Pratt, DOB Oct 05, 1970, MRN [id number] PCP:  [name], MD [facility] Providers Cardiologist:  Dina Rich, MD Electrophysiologist:  Sherryl Manges, MD     Referring MD: [name], MD   CC: Here for scheduled follow-up  History of Present Illness:    Mary Pratt is a 53 y.o. adult with a PMH of chronic systolic CHF, s/p ICD, NICM, hypertension, asthma, history of pain, GERD, acute DVT, and SS trait, who presents today for scheduled follow-up.  Cardiac catheterization in 2016 revealed no CAD.  EF returned to normal after CRT-D was placed in 2017.  Low A-fib burden on device check previously.  Not on anticoagulation.  ED visit on 10/08/2023 at [facility] for atypical CP. Nothing seemed to make it better/worse. Work-up was overall unremarkable.   She returned to the ED on October 10, 2023 for chest pain with associated SHOB x 3-4 days. No PE on CT scan. Workup was unremarkable.   10/19/2023 - Today she presents for follow-up.  She admits to several chief concerns including shortness of breath that is particularly noticed when laying down, admits to 2 pillow orthopnea, says she feels bloated, and also admits to insomnia.  Denies any recurrent chest pain since leaving the ED. Denies any palpitations, syncope, presyncope, dizziness, PND, swelling or significant weight changes, acute bleeding, or claudication.  She tells me she is now being followed by hematologist who is done a thorough workup regarding her history of sickle cell.  ED visit on 10/29/2023 for chest pain, workup was overall unremarkable.   11/30/2023 -today she presents for follow-up.  Doing better from when I last saw her. Denies any chest pain, palpitations, syncope, presyncope, dizziness, orthopnea, PND, swelling or significant weight changes, acute bleeding, or claudication. Continues to admit to occasional shortness of breath.   Please see the history  of present illness.    All other systems reviewed and are negative.  EKGs/Labs/Other Studies Reviewed:    The following studies were reviewed today:   EKG:  EKG is not ordered today.    Echo 10/2023:  1. Left ventricular ejection fraction, by estimation, is 55 to 60%. The  left ventricle has normal function. The left ventricle has no regional  wall motion abnormalities. There is mild left ventricular hypertrophy.  Left ventricular diastolic parameters  are indeterminate. The global longitudinal strain is indeterminate.   2. Right ventricular systolic function is normal. The right ventricular  size is normal. Tricuspid regurgitation signal is inadequate for assessing  PA pressure.   3. The mitral valve is normal in structure. No evidence of mitral valve  regurgitation. No evidence of mitral stenosis.   4. The tricuspid valve is abnormal.   5. The aortic valve is tricuspid. Aortic valve regurgitation is not  visualized. No aortic stenosis is present.   Comparison(s): A prior study was performed on 06/25/2021. EF 50-55%. Mild LVH.  Venous US lower extremity doppler 10/2023: IMPRESSION: Positive for deep venous thrombosis isolated to the left anterior tibial vein.  Myoview on 07/04/2021: Normal, low risk.   Echocardiogram on 06/25/2021:  1. Abnormal septal motion from pacing . Left ventricular ejection  fraction, by estimation, is 50 to 55%. The left ventricle has low normal  function. The left ventricle has no regional wall motion abnormalities.  There is mild left ventricular  hypertrophy. Left ventricular diastolic parameters were normal.   2. Pacing wires in RA/RV. Right ventricular systolic function  is normal.  The right ventricular size is normal. There is normal pulmonary artery  systolic pressure.   3. The mitral valve is normal in structure. Trivial mitral valve  regurgitation. No evidence of mitral stenosis.   4. The aortic valve is normal in structure. Aortic valve  regurgitation is  not visualized. No aortic stenosis is present.   5. The inferior vena cava is normal in size with greater than 50%  respiratory variability, suggesting right atrial pressure of 3 mmHg.  Right and left heart cath on 02/26/2015: There is severe left ventricular systolic dysfunction. Very mild elevation of right sided heart pressures Central aortic oxygen saturation 98%; pulmonary artery oxygen saturation 65%.   Dilated severe nonischemic cardiomyopathy with an ejection fraction of 15%.   Normal coronary arteries.   Mild pulmonary hypertension.   RECOMMENDATION:   Aggressive medical therapy for her nonischemic cardiomyopathy with medication titration over the next several months.  Consider interim LifeVest, pending follow-up LV function assessment in several months.  Physical Exam:    VS:  BP 120/78   Pulse 77   Ht 5\' 1"  (1.549 m)   Wt 166 lb 3.2 oz (75.4 kg)   LMP 09/14/2022 (Approximate)   SpO2 93%   BMI 31.40 kg/m     Wt Readings from Last 3 Encounters:  11/30/23 166 lb 3.2 oz (75.4 kg)  11/09/23 161 lb 14.4 oz (73.4 kg)  10/29/23 165 lb 12.8 oz (75.2 kg)     GEN: Obese, 53 y.o. female in no acute distress HEENT: Normal NECK: No JVD; No carotid bruits CARDIAC: S1/S2, RRR, no murmurs, rubs, gallops; 2+ peripheral pulses throughout, strong and equal bilaterally RESPIRATORY:  Clear to auscultation without rales, wheezing or rhonchi  MUSCULOSKELETAL:  No edema; No deformity  SKIN: Warm and dry NEUROLOGIC:  Alert and oriented x 3 PSYCHIATRIC:  Normal affect   ASSESSMENT & PLAN:    In order of problems listed above:  HFimpEF, s/p ICD, NICM, shortness of breath Stage C, NYHA class I-II symptoms. EF normal 10/2023. Euvolemic and well compensated on exam. Continue carvedilol, Entresto, and Aldactone. Will increase Lasix to 40 mg daily and obtain BMET in 2 weeks. Low sodium diet, fluid restriction <2L, and daily weights encouraged. Educated to contact our  office for weight gain of 2 lbs overnight or 5 lbs in one week.  Denies any shocks from her ICD.  Most recent remote device check was normal, normal HF diagnostics.  Continue follow-up with EP as scheduled. Instructed her to contact Allergy/Asthma specialist for further follow-up of her occasional [facility], felt to be pulmonary related.   Hypertension Blood pressure stable.  BP well-controlled at home.  Continue current medication regimen. Heart healthy diet and regular cardiovascular exercise encouraged.   3. Acute DVT Felt to be unprovoked. Following Hem/Onc. Continue Eliquis. Follow-up with [facility] as scheduled. Care and ED precautions discussed.   Disposition: Pt is requesting to follow-up with Dr. Wyline Mood as scheduled. Follow-up with Dr. Dina Rich or [facility] as scheduled or sooner if anything changes.  Medication Adjustments/Labs and Tests Ordered: Current medicines are reviewed at length with the patient today.  Concerns regarding medicines are outlined above.  Orders Placed This Encounter  Procedures   Basic metabolic panel   Meds ordered this encounter  Medications   furosemide (LASIX) 40 MG tablet    Sig: Take 1 tablet (40 mg total) by mouth daily.    Dispense:  30 tablet    Refill:  5    11/30/2023-Dose  Increase    Patient Instructions  Medication Instructions:  Your physician has recommended you make the following change in your medication:  Start taking Furosemide 40 mg once daily Continue taking all other medications as prescribed  Labwork: BMET in 2 weeks at [facility] or [facility]  Testing/Procedures: None  Follow-Up: Your physician recommends that you schedule a follow-up appointment in: Follow up as scheduled  Any Other Special Instructions Will Be Listed Below (If Applicable). Call Dr. Malachy Moan office to make an appointment   Thank you for choosing [facility]!     If you need a refill on your cardiac medications before your next appointment,  please call your pharmacy.    Signed, Sharlene Dory, NP

## 2023-11-30 NOTE — Patient Instructions (Addendum)
 Medication Instructions:  Your physician has recommended you make the following change in your medication:  Start taking Furosemide 40 mg once daily Continue taking all other medications as prescribed  Labwork: BMET in 2 weeks at [facility] or [facility]  Testing/Procedures: None  Follow-Up: Your physician recommends that you schedule a follow-up appointment in: Follow up as scheduled  Any Other Special Instructions Will Be Listed Below (If Applicable). Call Dr. Malachy Moan office to make an appointment   Thank you for choosing [facility]!     If you need a refill on your cardiac medications before your next appointment, please call your pharmacy.

## 2023-12-01 ENCOUNTER — Ambulatory Visit (HOSPITAL_COMMUNITY)

## 2023-12-01 ENCOUNTER — Encounter: Payer: Self-pay | Admitting: Internal Medicine

## 2023-12-03 ENCOUNTER — Ambulatory Visit: Admitting: Allergy & Immunology

## 2023-12-08 ENCOUNTER — Ambulatory Visit: Admitting: Physician Assistant

## 2023-12-13 ENCOUNTER — Encounter (HOSPITAL_COMMUNITY): Payer: Self-pay

## 2023-12-13 ENCOUNTER — Ambulatory Visit: Payer: BC Managed Care – PPO | Admitting: Obstetrics and Gynecology

## 2023-12-13 ENCOUNTER — Ambulatory Visit (HOSPITAL_COMMUNITY)
Admission: RE | Admit: 2023-12-13 | Discharge: 2023-12-13 | Disposition: A | Discharge status: Home / Self Care | Source: Ambulatory Visit | Attending: Family Medicine | Admitting: Family Medicine

## 2023-12-13 DIAGNOSIS — Z1231 Encounter for screening mammogram for malignant neoplasm of breast: Secondary | ICD-10-CM | POA: Diagnosis present

## 2023-12-14 ENCOUNTER — Encounter: Payer: Self-pay | Admitting: Hematology

## 2023-12-14 ENCOUNTER — Inpatient Hospital Stay: Admitting: Hematology

## 2023-12-14 ENCOUNTER — Inpatient Hospital Stay: Attending: Oncology | Admitting: Hematology

## 2023-12-14 ENCOUNTER — Inpatient Hospital Stay

## 2023-12-14 VITALS — BP 128/98 | HR 58 | Temp 97.9°F | Resp 18 | Ht 61.0 in | Wt 165.3 lb

## 2023-12-14 DIAGNOSIS — Z7901 Long term (current) use of anticoagulants: Secondary | ICD-10-CM | POA: Diagnosis not present

## 2023-12-14 DIAGNOSIS — Z801 Family history of malignant neoplasm of trachea, bronchus and lung: Secondary | ICD-10-CM | POA: Diagnosis not present

## 2023-12-14 DIAGNOSIS — I82492 Acute embolism and thrombosis of other specified deep vein of left lower extremity: Secondary | ICD-10-CM

## 2023-12-14 DIAGNOSIS — G8929 Other chronic pain: Secondary | ICD-10-CM | POA: Diagnosis not present

## 2023-12-14 DIAGNOSIS — I428 Other cardiomyopathies: Secondary | ICD-10-CM | POA: Insufficient documentation

## 2023-12-14 DIAGNOSIS — Z8042 Family history of malignant neoplasm of prostate: Secondary | ICD-10-CM | POA: Insufficient documentation

## 2023-12-14 DIAGNOSIS — I82402 Acute embolism and thrombosis of unspecified deep veins of left lower extremity: Secondary | ICD-10-CM | POA: Insufficient documentation

## 2023-12-14 DIAGNOSIS — D573 Sickle-cell trait: Secondary | ICD-10-CM | POA: Diagnosis not present

## 2023-12-14 DIAGNOSIS — D6862 Lupus anticoagulant syndrome: Secondary | ICD-10-CM | POA: Insufficient documentation

## 2023-12-14 DIAGNOSIS — Z803 Family history of malignant neoplasm of breast: Secondary | ICD-10-CM | POA: Diagnosis not present

## 2023-12-14 LAB — CBC WITH DIFFERENTIAL/PLATELET
Abs Immature Granulocytes: 0.02 10*3/uL (ref 0.00–0.07)
Basophils Absolute: 0 10*3/uL (ref 0.0–0.1)
Basophils Relative: 1 %
Eosinophils Absolute: 0.1 10*3/uL (ref 0.0–0.5)
Eosinophils Relative: 2 %
HCT: 37.2 % (ref 36.0–46.0)
Hemoglobin: 12.3 g/dL (ref 12.0–15.0)
Immature Granulocytes: 0 %
Lymphocytes Relative: 37 %
Lymphs Abs: 2.4 10*3/uL (ref 0.7–4.0)
MCH: 26.5 pg (ref 26.0–34.0)
MCHC: 33.1 g/dL (ref 30.0–36.0)
MCV: 80.2 fL (ref 80.0–100.0)
Monocytes Absolute: 0.5 10*3/uL (ref 0.1–1.0)
Monocytes Relative: 7 %
Neutro Abs: 3.5 10*3/uL (ref 1.7–7.7)
Neutrophils Relative %: 53 %
Platelets: 250 10*3/uL (ref 150–400)
RBC: 4.64 MIL/uL (ref 3.87–5.11)
RDW: 14.4 % (ref 11.5–15.5)
WBC: 6.6 10*3/uL (ref 4.0–10.5)
nRBC: 0 % (ref 0.0–0.2)

## 2023-12-14 LAB — COMPREHENSIVE METABOLIC PANEL WITH GFR
ALT: 16 U/L (ref 0–44)
AST: 16 U/L (ref 15–41)
Albumin: 4.4 g/dL (ref 3.5–5.0)
Alkaline Phosphatase: 95 U/L (ref 38–126)
Anion gap: 7 (ref 5–15)
BUN: 13 mg/dL (ref 6–20)
CO2: 28 mmol/L (ref 22–32)
Calcium: 9.8 mg/dL (ref 8.9–10.3)
Chloride: 104 mmol/L (ref 98–111)
Creatinine, Ser: 1.05 mg/dL — ABNORMAL HIGH (ref 0.44–1.00)
GFR, Estimated: >60 mL/min (ref >60)
Glucose, Bld: 86 mg/dL (ref 70–99)
Potassium: 4.1 mmol/L (ref 3.5–5.1)
Sodium: 139 mmol/L (ref 135–145)
Total Bilirubin: 0.4 mg/dL (ref 0.0–1.2)
Total Protein: 8.2 g/dL — ABNORMAL HIGH (ref 6.5–8.1)

## 2023-12-14 NOTE — Progress Notes (Signed)
 [facility]   [phone] [phone]   Clinic New Consult Note   Patient Care Team: [name], MD as PCP - General (Family Medicine) Wyline Mood Dorothe Pea, MD as PCP - Cardiology (Cardiology) Duke Salvia, MD as PCP - Electrophysiology (Cardiology) Drema Dallas, DO as Consulting Physician (Neurology) 12/14/2023  CHIEF COMPLAINTS/PURPOSE OF CONSULTATION:  Left lower extremity DVT and sickle cell trait  REFERRING PHYSICIAN: Self-referral  Discussed the use of AI scribe software for clinical note transcription with the patient, who gave verbal consent to proceed.  History of Present Illness Mary Pratt, a 53 year old female with a history of congestive heart failure and sickle cell trait, presents for a second opinion regarding her history of thrombosis.   She reports having had a blood clot in her left lower extremity in February of the current year, which was treated with Eliquis.  She denies any acute event or long distance travel before the DVT.  She continues to experience intermittent pain in her leg, which she describes as coming and going. She is unsure if the blood clot has resolved and expresses concern about discontinuing the blood thinner without further testing.  In addition to her concerns about thrombosis, [name] reports experiencing changes in her nails and scalp. She describes a burning sensation on her scalp and has undergone a scalp biopsy. She also notes that her nails are not growing as they should. She has seen a dermatologist for these issues, but she remains concerned about her overall health, particularly her blood. She has been tested for lupus anticoagulant, which was positive, and she is due for a repeat test after discontinuing the blood thinner.  [name] also has a history of congestive heart failure, which she attributes to severe stress following the death of her son. She has a defibrillator implant and is under the care of a  cardiologist. She reports that her heart function has improved, with her last echocardiogram showing a near-normal ejection fraction. She also has a history of sickle cell trait and reports intermittent pain in her legs and arms, which she believes may be related to this condition.     MEDICAL HISTORY:  Past Medical History:  Diagnosis Date   Abnormal pap 05/2012   ASCUS + HPV, 11/14 LGSIL   Anxiety    Asthma    Cardiac defibrillator in place    Chronic systolic CHF (congestive heart failure) (HCC)    a. 02/2015 Echo: EF 20%, sev dil w/ diff HK, worse @ inf base. Tiv AI, mild MR, mod dil LA, PASP .   Functional dyspepsia    early satiety    GERD with stricture    dilated 2010   HELICOBACTER PYLORI GASTRITIS 01/15/2009   dyspnea, ? reaction to Biaxin/amoxicillin  Pylera 01/15/09 - completed therapy      IBS (irritable bowel syndrome)    NICM (nonischemic cardiomyopathy) (HCC)    a. 02/2015 Cath: nl cors, EF 15%, mild PAH;  b. 02/2015 Enrolled in VEST trial.   Sickle cell trait (HCC)    Situational depression    "son died"   Vitamin B 12 deficiency 11/2022    SURGICAL HISTORY: Past Surgical History:  Procedure Laterality Date   BIV ICD GENERATOR CHANGEOUT N/A 01/24/2021   Procedure: BIV ICD GENERATOR CHANGEOUT;  Surgeon: Duke Salvia, MD;  Location: [facility] INVASIVE CV LAB;  Service: Cardiovascular;  Laterality: N/A;   CARDIAC CATHETERIZATION N/A 02/26/2015   Procedure: Right/Left Heart Cath and Coronary Angiography;  Surgeon: Lennette Bihari, MD;  Location: [facility] INVASIVE CV LAB;  Service: Cardiovascular;  Laterality: N/A;   COLPOSCOPY  09/08/2011   neg   EP IMPLANTABLE DEVICE N/A 10/14/2015   Procedure: BiV ICD Insertion CRT-D;  Surgeon: Duke Salvia, MD;  Location: [facility] INVASIVE CV LAB;  Service: Cardiovascular;  Laterality: N/A;   ESOPHAGOGASTRODUODENOSCOPY (EGD) WITH ESOPHAGEAL DILATION  11/30/2008   esophageal ring dilated 54 French, H. pylori gastritis   HARVEST BONE GRAFT   02/2023   TUBAL LIGATION  09/07/1989   WISDOM TOOTH EXTRACTION     WRIST SURGERY  2023    SOCIAL HISTORY: Social History   Socioeconomic History   Marital status: Single    Spouse name: Not on file   Number of children: 3   Years of education: Not on file   Highest education level: Not on file  Occupational History   Occupation: texturing operator    Employer: [employer]  Tobacco Use   Smoking status: Never    Passive exposure: Never   Smokeless tobacco: Never  Vaping Use   Vaping status: Never Used  Substance and Sexual Activity   Alcohol use: Not Currently    Comment: occasional wine   Drug use: No   Sexual activity: Yes    Partners: Male    Birth control/protection: Surgical    Comment: BTL  Other Topics Concern   Not on file  Social History Narrative   Single, 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   2 sons 1 daughter 1 son deceased   Drinks caffeine up to a few a day, never smoker no drug use   Occ wine   Social Drivers of Corporate investment banker Strain: Not on file  Food Insecurity: No Food Insecurity (10/15/2023)   Hunger Vital Sign    Worried About Running Out of Food in the Last Year: Never true    Ran Out of Food in the Last Year: Never true  Transportation Needs: No Transportation Needs (10/15/2023)   PRAPARE - Administrator, Civil Service (Medical): No    Lack of Transportation (Non-Medical): No  Physical Activity: Not on file  Stress: Not on file  Social Connections: Unknown (01/20/2022)   [facility]   Social Network    Social Network: Not on file  Intimate Partner Violence: Not At Risk (10/15/2023)   Humiliation, Afraid, Rape, and Kick questionnaire    Fear of Current or Ex-Partner: No    Emotionally Abused: No    Physically Abused: No    Sexually Abused: No    FAMILY HISTORY: Family History  Problem Relation Age of Onset   Diabetes Mother    Hypertension Mother     Hypothyroidism Mother    Breast cancer Maternal Aunt    Brain cancer Maternal Uncle    Asthma Paternal Aunt    Prostate cancer Paternal Uncle    Lung cancer Maternal Grandmother    Eczema Daughter    Allergic rhinitis Daughter    Colon cancer Neg Hx    Colon polyps Neg Hx    Esophageal cancer Neg Hx     ALLERGIES:  is allergic to clarithromycin, amoxicillin, meloxicam, spiriva respimat [tiotropium bromide monohydrate], and symbicort [budesonide-formoterol fumarate].  MEDICATIONS:  Current Outpatient Medications  Medication Sig Dispense Refill   albuterol (VENTOLIN HFA) 108 (90 Base) MCG/ACT inhaler Inhale 1-2 puffs into the lungs daily as needed.     apixaban (ELIQUIS) 5 MG  TABS tablet Take 1 tablet (5 mg total) by mouth 2 (two) times daily. Start taking after completion of starter pack. 60 tablet 4   Azelastine HCl 137 MCG/SPRAY SOLN Place 2 sprays into both nostrils 2 (two) times daily.     BREO ELLIPTA 200-25 MCG/ACT AEPB Inhale 1 puff into the lungs daily.     carvedilol (COREG) 25 MG tablet TAKE 1 TABLET (25 MG TOTAL) BY MOUTH TWICE A DAY WITH MEALS 180 tablet 1   clobetasol ointment (TEMOVATE) 0.05 % Apply 1 Application topically 2 (two) times daily. (Patient not taking: Reported on 11/30/2023)     Cyanocobalamin (VITAMIN B-12 IJ) Inject as directed every 30 (thirty) days.     diazepam (VALIUM) 2 MG tablet Take 1 tablet (2 mg total) by mouth every 8 (eight) hours as needed (dizziness). 20 tablet 0   ENTRESTO 24-26 MG TAKE 1 TABLET BY MOUTH TWICE A DAY 60 tablet 0   EPINEPHrine (EPIPEN 2-PAK) 0.3 mg/0.3 mL IJ SOAJ injection Inject 0.3 mg into the muscle as needed for anaphylaxis. 2 each 1   ferrous sulfate 325 (65 FE) MG EC tablet Take 1 tablet (325 mg total) by mouth every other day. (Patient not taking: Reported on 11/30/2023) 45 tablet 3   fluocinonide ointment (LIDEX) 0.05 % Apply 1 Application topically daily as needed (eczema). (Patient not taking: Reported on 11/30/2023)      fluticasone (FLONASE) 50 MCG/ACT nasal spray Place 2 sprays into both nostrils daily.     furosemide (LASIX) 40 MG tablet Take 1 tablet (40 mg total) by mouth daily. 30 tablet 5   Galcanezumab-gnlm (EMGALITY) 120 MG/ML SOAJ INJECT 240 MG INTO THE SKIN ONCE FOR 1 DOSE. LOADING DOSE (Patient taking differently: Inject 240 mg into the skin every 30 (thirty) days.) 1 mL 1   meclizine (ANTIVERT) 25 MG tablet Take 1 tablet (25 mg total) by mouth 3 (three) times daily as needed for dizziness. (Patient not taking: Reported on 11/30/2023) 20 tablet 0   nitroGLYCERIN (NITROSTAT) 0.4 MG SL tablet Place 1 tablet (0.4 mg total) under the tongue every 5 (five) minutes x 3 doses as needed for chest pain. 25 tablet 12   pantoprazole (PROTONIX) 40 MG tablet Take 40 mg by mouth daily.     sertraline (ZOLOFT) 100 MG tablet Take 100 mg by mouth daily.     spironolactone (ALDACTONE) 25 MG tablet TAKE 1 TABLET (25 MG TOTAL) BY MOUTH DAILY. 90 tablet 1   Vitamin D, Ergocalciferol, (DRISDOL) 1.25 MG (50000 UNIT) CAPS capsule Take 50,000 Units by mouth once a week.     No current facility-administered medications for this visit.    REVIEW OF SYSTEMS:   Constitutional: Denies fevers, chills or abnormal night sweats Eyes: Denies blurriness of vision, double vision or watery eyes Ears, nose, mouth, throat, and face: Denies mucositis or sore throat Respiratory: Denies cough, dyspnea or wheezes Cardiovascular: Denies palpitation, chest discomfort or lower extremity swelling Gastrointestinal:  Denies nausea, heartburn or change in bowel habits Skin: Denies abnormal skin rashes Lymphatics: Denies new lymphadenopathy or easy bruising Neurological:Denies numbness, tingling or new weaknesses Behavioral/Psych: Mood is stable, no new changes  All other systems were reviewed with the patient and are negative.  PHYSICAL EXAMINATION: ECOG PERFORMANCE STATUS: 0 - Asymptomatic  Vitals:   12/14/23 1449  BP: (!) 128/98  Pulse:  (!) 58  Resp: 18  Temp: 97.9 F (36.6 C)  SpO2: 98%   Filed Weights   12/14/23 1449  Weight: 165  lb 4.8 oz (75 kg)    GENERAL:alert, no distress and comfortable SKIN: skin color, texture, turgor are normal, no rashes or significant lesions EYES: normal, conjunctiva are pink and non-injected, sclera clear OROPHARYNX:no exudate, no erythema and lips, buccal mucosa, and tongue normal  NECK: supple, thyroid normal size, non-tender, without nodularity LYMPH:  no palpable lymphadenopathy in the cervical, axillary or inguinal LUNGS: clear to auscultation and percussion with normal breathing effort HEART: regular rate & rhythm and no murmurs and no lower extremity edema ABDOMEN:abdomen soft, non-tender and normal bowel sounds Musculoskeletal:no cyanosis of digits and no clubbing  PSYCH: alert & oriented x 3 with fluent speech NEURO: no focal motor/sensory deficits  Physical Exam NECK: No cervical lymphadenopathy. ABDOMEN: Abdomen non-tender. No hepatomegaly. No splenomegaly. EXTREMITIES: Pain in left leg on palpation.  LABORATORY DATA:  I have reviewed the data as listed    Latest Ref Rng & Units 12/14/2023    3:35 PM 10/29/2023    8:18 PM 10/26/2023    1:07 PM  CBC  WBC 4.0 - 10.5 K/uL 6.6  6.7  6.0   Hemoglobin 12.0 - 15.0 g/dL 78.4  69.6  29.5   Hematocrit 36.0 - 46.0 % 37.2  35.8  37.2   Platelets 150 - 400 K/uL 250  215  206     @cmpl @  RADIOGRAPHIC STUDIES: I have personally reviewed the radiological images as listed and agreed with the findings in the report. No results found.  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  She is obese and has sedentary  lifestyle which are risk for thrombosis.  On Eliquis for over a month, reports intermittent leg pain without swelling.  -Her initial hypercoagulopathy workup was negative except for positive lupus anticoagulant.  This need to be repeated after she came off Eliquis.   -Treatment duration is 6 months due to ongoing pain. Plan to reassess with blood tests post-Eliquis to determine need for continued anticoagulation. Advised on staying active, weight management, and using compression stockings during long-distance travel to reduce future clot risk. - Continue Eliquis for a total of 6 months - Order ultrasound of the left leg in early August to assess clot resolution due to her remaining intermittent leg pain. - Schedule follow-up blood tests 2-4 weeks after stopping Eliquis to reassess clotting risk -If her repeated lupus anticoagulant positive, I recommend lifelong anticoagulation. - Advise on lifestyle modifications including staying active, weight management, and using compression stockings during long-distance travel  Sickle cell trait Confirmed by hemoglobin electrophoresis showing 39% sickle hemoglobin. No sickle cell disease or crises. Sickle cell trait typically does not cause significant health issues, but there is a risk for anemia and thrombosis. Pain is not related to sickle cell trait. Offered referral to sickle cell clinic for further evaluation and management. - Refer to sickle cell clinic for further evaluation and management  Non-ischemic cardiomyopathy Likely stress-induced following a traumatic event in 2016. Has a defibrillator implant and is under cardiology care. Recent echocardiogram shows improved heart function with an ejection fraction of 55-60%, indicating recovery to near-normal levels. - Continue current cardiac medications including Coreg, furosemide, Entresto, and spironolactone - Continue follow-up with cardiologist  Chronic pain Reports chronic pain in legs and arms,  not related to sickle cell trait. Pain is intermittent and may be related to conditions such as fibromyalgia or arthritis. Recommended evaluation by primary care physician for further assessment. - Continue current pain management strategies - Consider evaluation by primary care physician for further assessment of chronic pain  Plan -Continue Eliquis until early August 2025 -Will repeat left lower extremity Doppler in August -Repeat lupus anticoagulant and D-dimer after she comes off Eliquis -She will follow-up with Dr. Anders Simmonds after the above lab work in late August     Orders Placed This Encounter  Procedures   CBC with Differential/Platelet    Standing Status:   Future    Number of Occurrences:   1    Expected Date:   12/14/2023    Expiration Date:   12/13/2024   Comprehensive metabolic panel with GFR    Standing Status:   Future    Number of Occurrences:   1    Expected Date:   12/14/2023    Expiration Date:   12/13/2024    All questions were answered. The patient knows to call the clinic with any problems, questions or concerns. I spent 35 minutes counseling the patient face to face. The total time spent in the appointment was 45 minutes and more than 50% was on counseling.     Malachy Mood, MD 12/14/2023 4:39 PM

## 2023-12-16 ENCOUNTER — Other Ambulatory Visit: Payer: Self-pay

## 2023-12-16 ENCOUNTER — Other Ambulatory Visit (HOSPITAL_COMMUNITY): Payer: Self-pay | Admitting: Family Medicine

## 2023-12-16 ENCOUNTER — Other Ambulatory Visit: Payer: Self-pay | Admitting: Hematology

## 2023-12-16 DIAGNOSIS — M79605 Pain in left leg: Secondary | ICD-10-CM

## 2023-12-16 DIAGNOSIS — I82492 Acute embolism and thrombosis of other specified deep vein of left lower extremity: Secondary | ICD-10-CM

## 2023-12-16 DIAGNOSIS — R928 Other abnormal and inconclusive findings on diagnostic imaging of breast: Secondary | ICD-10-CM

## 2023-12-17 ENCOUNTER — Encounter: Payer: Self-pay | Admitting: *Deleted

## 2023-12-20 ENCOUNTER — Ambulatory Visit: Payer: BC Managed Care – PPO | Admitting: Cardiology

## 2023-12-20 ENCOUNTER — Telehealth: Payer: Self-pay | Admitting: Nurse Practitioner

## 2023-12-20 NOTE — Telephone Encounter (Signed)
Left a message for patient to call office back regarding lab work.

## 2023-12-20 NOTE — Telephone Encounter (Signed)
 Patient was returning call. Please advise ?

## 2023-12-20 NOTE — Telephone Encounter (Signed)
 Patient is requesting that provider order Estrogen lab work for her. Advised pt that this is normally labs to either PCP or OBGYN order. Pt stated that she is frustrated with PCP at this time because he cannot "get anything right".   Please advise.

## 2023-12-20 NOTE — Progress Notes (Deleted)
 Clinical Summary Ms. Cura is a 53 y.o.adult  seen today for follow up of the following medical problems.    1.Chronic HFrEF/NICM 08/2015 ehco LVEF 30% 02/2015 RHC/LHC: normal coronaries, mild pulm HTN 03/2015 cMRI: LVEF 31%, no LGE     07/2018 echo LVEF 60-65% 06/2021 echo LVEF 50-55% - CRT-D followed by EP. LVEF seemed to improved after CRT implant 10/2015 10/2021 normal device check. Short afib 1 hr 30 min - no SOB/DOE. Occasional edema - compliant with meds    10/2023 echo: LVEF 55-60%, no WMAs, indet diastolic. Normal RV function   2.Chest pain - 06/2021 nuclear stress: no ischemia - no recent chest pains.   3. OSA screen 08/11/2021 sleep study was negative.   4. 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  Past Medical History:  Diagnosis Date   Abnormal pap 05/2012   ASCUS + HPV, 11/14 LGSIL   Anxiety    Asthma    Cardiac defibrillator in place    Chronic systolic CHF (congestive heart failure) (HCC)    a. 02/2015 Echo: EF 20%, sev dil w/ diff HK, worse @ inf base. Tiv AI, mild MR, mod dil LA, PASP .   Functional dyspepsia    early satiety    GERD with stricture    dilated 2010   HELICOBACTER PYLORI GASTRITIS 01/15/2009   dyspnea, ? reaction to Biaxin/amoxicillin  Pylera 01/15/09 - completed therapy      IBS (irritable bowel syndrome)    NICM (nonischemic cardiomyopathy) (HCC)    a. 02/2015 Cath: nl cors, EF 15%, mild PAH;  b. 02/2015 Enrolled in VEST trial.   Sickle cell trait (HCC)    Situational depression    "son died"   Vitamin B 12 deficiency 11/2022     Allergies  Allergen Reactions   Clarithromycin Hives   Amoxicillin Palpitations    REACTION: Tacycardia   Meloxicam Rash   Spiriva Respimat [Tiotropium Bromide Monohydrate] Rash   Symbicort [Budesonide-Formoterol Fumarate] Rash     Current Outpatient Medications  Medication Sig  Dispense Refill   albuterol (VENTOLIN HFA) 108 (90 Base) MCG/ACT inhaler Inhale 1-2 puffs into the lungs daily as needed.     apixaban (ELIQUIS) 5 MG TABS tablet Take 1 tablet (5 mg total) by mouth 2 (two) times daily. Start taking after completion of starter pack. 60 tablet 4   Azelastine HCl 137 MCG/SPRAY SOLN Place 2 sprays into both nostrils 2 (two) times daily.     BREO ELLIPTA 200-25 MCG/ACT AEPB Inhale 1 puff into the lungs daily.     carvedilol (COREG) 25 MG tablet TAKE 1 TABLET (25 MG TOTAL) BY MOUTH TWICE A DAY WITH MEALS 180 tablet 1   clobetasol ointment (TEMOVATE) 0.05 % Apply 1 Application topically 2 (two) times daily. (Patient not taking: Reported on 11/30/2023)     Cyanocobalamin (VITAMIN B-12 IJ) Inject as directed every 30 (thirty) days.     diazepam (VALIUM) 2 MG tablet Take 1 tablet (2 mg total) by mouth every 8 (eight) hours as needed (dizziness). 20 tablet 0   ENTRESTO 24-26 MG TAKE 1 TABLET BY MOUTH TWICE A DAY 60 tablet 0   EPINEPHrine (EPIPEN 2-PAK) 0.3 mg/0.3 mL IJ SOAJ injection Inject 0.3 mg into the muscle as needed for anaphylaxis. 2 each 1   ferrous sulfate 325 (65 FE) MG EC tablet Take 1  tablet (325 mg total) by mouth every other day. (Patient not taking: Reported on 11/30/2023) 45 tablet 3   fluocinonide ointment (LIDEX) 0.05 % Apply 1 Application topically daily as needed (eczema). (Patient not taking: Reported on 11/30/2023)     fluticasone (FLONASE) 50 MCG/ACT nasal spray Place 2 sprays into both nostrils daily.     furosemide (LASIX) 40 MG tablet Take 1 tablet (40 mg total) by mouth daily. 30 tablet 5   Galcanezumab-gnlm (EMGALITY) 120 MG/ML SOAJ INJECT 240 MG INTO THE SKIN ONCE FOR 1 DOSE. LOADING DOSE (Patient taking differently: Inject 240 mg into the skin every 30 (thirty) days.) 1 mL 1   meclizine (ANTIVERT) 25 MG tablet Take 1 tablet (25 mg total) by mouth 3 (three) times daily as needed for dizziness. (Patient not taking: Reported on 11/30/2023) 20 tablet 0    nitroGLYCERIN (NITROSTAT) 0.4 MG SL tablet Place 1 tablet (0.4 mg total) under the tongue every 5 (five) minutes x 3 doses as needed for chest pain. 25 tablet 12   pantoprazole (PROTONIX) 40 MG tablet Take 40 mg by mouth daily.     sertraline (ZOLOFT) 100 MG tablet Take 100 mg by mouth daily.     spironolactone (ALDACTONE) 25 MG tablet TAKE 1 TABLET (25 MG TOTAL) BY MOUTH DAILY. 90 tablet 1   Vitamin D, Ergocalciferol, (DRISDOL) 1.25 MG (50000 UNIT) CAPS capsule Take 50,000 Units by mouth once a week.     No current facility-administered medications for this visit.     Past Surgical History:  Procedure Laterality Date   BIV ICD GENERATOR CHANGEOUT N/A 01/24/2021   Procedure: BIV ICD GENERATOR CHANGEOUT;  Surgeon: Duke Salvia, MD;  Location: [facility] INVASIVE CV LAB;  Service: Cardiovascular;  Laterality: N/A;   CARDIAC CATHETERIZATION N/A 02/26/2015   Procedure: Right/Left Heart Cath and Coronary Angiography;  Surgeon: Lennette Bihari, MD;  Location: [facility] INVASIVE CV LAB;  Service: Cardiovascular;  Laterality: N/A;   COLPOSCOPY  09/08/2011   neg   EP IMPLANTABLE DEVICE N/A 10/14/2015   Procedure: BiV ICD Insertion CRT-D;  Surgeon: Duke Salvia, MD;  Location: [facility] INVASIVE CV LAB;  Service: Cardiovascular;  Laterality: N/A;   ESOPHAGOGASTRODUODENOSCOPY (EGD) WITH ESOPHAGEAL DILATION  11/30/2008   esophageal ring dilated 54 French, H. pylori gastritis   HARVEST BONE GRAFT  02/2023   TUBAL LIGATION  09/07/1989   WISDOM TOOTH EXTRACTION     WRIST SURGERY  2023     Allergies  Allergen Reactions   Clarithromycin Hives   Amoxicillin Palpitations    REACTION: Tacycardia   Meloxicam Rash   Spiriva Respimat [Tiotropium Bromide Monohydrate] Rash   Symbicort [Budesonide-Formoterol Fumarate] Rash      Family History  Problem Relation Age of Onset   Diabetes Mother    Hypertension Mother    Hypothyroidism Mother    Breast cancer Maternal Aunt    Brain cancer Maternal Uncle    Asthma  Paternal Aunt    Prostate cancer Paternal Uncle    Lung cancer Maternal Grandmother    Eczema Daughter    Allergic rhinitis Daughter    Colon cancer Neg Hx    Colon polyps Neg Hx    Esophageal cancer Neg Hx      Social History Ms. Tovey reports that she has never smoked. She has never been exposed to tobacco smoke. She has never used smokeless tobacco. Ms. Cea reports that she does not currently use alcohol.   Review of Systems CONSTITUTIONAL: No weight loss,  fever, chills, weakness or fatigue.  HEENT: Eyes: No visual loss, blurred vision, double vision or yellow sclerae.No hearing loss, sneezing, congestion, runny nose or sore throat.  SKIN: No rash or itching.  CARDIOVASCULAR:  RESPIRATORY: No shortness of breath, cough or sputum.  GASTROINTESTINAL: No anorexia, nausea, vomiting or diarrhea. No abdominal pain or blood.  GENITOURINARY: No burning on urination, no polyuria NEUROLOGICAL: No headache, dizziness, syncope, paralysis, ataxia, numbness or tingling in the extremities. No change in bowel or bladder control.  MUSCULOSKELETAL: No muscle, back pain, joint pain or stiffness.  LYMPHATICS: No enlarged nodes. No history of splenectomy.  PSYCHIATRIC: No history of depression or anxiety.  ENDOCRINOLOGIC: No reports of sweating, cold or heat intolerance. No polyuria or polydipsia.  Aaron Aas   Physical Examination There were no vitals filed for this visit. There were no vitals filed for this visit.  Gen: resting comfortably, no acute distress HEENT: no scleral icterus, pupils equal round and reactive, no palptable cervical adenopathy,  CV Resp: Clear to auscultation bilaterally GI: abdomen is soft, non-tender, non-distended, normal bowel sounds, no hepatosplenomegaly MSK: extremities are warm, no edema.  Skin: warm, no rash Neuro:  no focal deficits Psych: appropriate affect   Diagnostic Studies  08/11/2021 sleep study   IMPRESSIONS - No significant obstructive sleep  apnea occurred during this study (AHI = 2.7/h). - Mild oxygen desaturation was noted during this study (Min O2 = 87.00%). - The patient snored with moderate snoring volume. - No cardiac abnormalities were noted during this study. - Mild periodic limb movements of sleep occurred during the study. No significant associated arousals.   10/2023 echo 1. Left ventricular ejection fraction, by estimation, is 55 to 60%. The  left ventricle has normal function. The left ventricle has no regional  wall motion abnormalities. There is mild left ventricular hypertrophy.  Left ventricular diastolic parameters  are indeterminate. The global longitudinal strain is indeterminate.   2. Right ventricular systolic function is normal. The right ventricular  size is normal. Tricuspid regurgitation signal is inadequate for assessing  PA pressure.   3. The mitral valve is normal in structure. No evidence of mitral valve  regurgitation. No evidence of mitral stenosis.   4. The tricuspid valve is abnormal.   5. The aortic valve is tricuspid. Aortic valve regurgitation is not  visualized. No aortic stenosis is present.     Assessment and Plan   1.Chronic systolic HF - normalization of LV function after BiV AICD impantation, LVEF remains normal - no symptoms, continue current meds   2. Chest pain - recent negative stress test, no recurrent symptoms. Continue to monitor   3. Isolated episode Afib - transient 1.5 hr episdoes on last device check, on review of other checks do not see noted - monitor at this time. First time im seeing her but from charted info her CHADS2Vasc would be 1 for gender given her HF as resolved. I see no documentation of HTN history or diabetes, no prior stroke or CAD. At this time does not appear anticoag would be indicated even if she had an increased burden.      Laurann Pollock, M.D., F.A.C.C.

## 2023-12-20 NOTE — Telephone Encounter (Signed)
 Pt called in asking if she can have blood work for estrogen. Please advise.

## 2023-12-20 NOTE — Telephone Encounter (Signed)
 Notified patient via Clinical cytogeneticist

## 2023-12-22 ENCOUNTER — Encounter: Payer: Self-pay | Admitting: Oncology

## 2023-12-22 NOTE — Telephone Encounter (Signed)
 She transferred to Dr. [name] from [place].  She is having labs here, however.

## 2023-12-23 ENCOUNTER — Encounter: Payer: Self-pay | Admitting: Hematology

## 2023-12-27 ENCOUNTER — Other Ambulatory Visit: Payer: Self-pay

## 2023-12-27 DIAGNOSIS — E611 Iron deficiency: Secondary | ICD-10-CM

## 2023-12-27 DIAGNOSIS — D573 Sickle-cell trait: Secondary | ICD-10-CM

## 2023-12-27 DIAGNOSIS — I82492 Acute embolism and thrombosis of other specified deep vein of left lower extremity: Secondary | ICD-10-CM

## 2023-12-28 ENCOUNTER — Other Ambulatory Visit: Payer: Self-pay

## 2023-12-28 ENCOUNTER — Ambulatory Visit (HOSPITAL_COMMUNITY)
Admission: RE | Admit: 2023-12-28 | Discharge: 2023-12-28 | Disposition: A | Discharge status: Home / Self Care | Source: Ambulatory Visit | Attending: Hematology | Admitting: Hematology

## 2023-12-28 ENCOUNTER — Telehealth: Payer: Self-pay

## 2023-12-28 ENCOUNTER — Telehealth: Payer: Self-pay | Admitting: Hematology

## 2023-12-28 ENCOUNTER — Telehealth: Payer: Self-pay | Admitting: *Deleted

## 2023-12-28 DIAGNOSIS — D573 Sickle-cell trait: Secondary | ICD-10-CM | POA: Insufficient documentation

## 2023-12-28 DIAGNOSIS — I82442 Acute embolism and thrombosis of left tibial vein: Secondary | ICD-10-CM

## 2023-12-28 DIAGNOSIS — I82492 Acute embolism and thrombosis of other specified deep vein of left lower extremity: Secondary | ICD-10-CM | POA: Diagnosis not present

## 2023-12-28 NOTE — Telephone Encounter (Signed)
 Late Entry:  Pt called c/o of pain the the LLE below the knee.  Pt has hx of DVT and Sickle Cell trait.  Pt denied swelling in the leg and stated her skin was normal temp when touched.  Pt denied redness in the leg but stated her toes are red.  Pt stated her skin was WNL.  Pt rated pain at 5/10.  Pt denied difficulty walking or SOB.  Spoke with Dr. [name] regarding symptoms verbal order w/readback given for doppler to r/o DVT.  Pt stated she's available to come in for a doppler.  Pt scheduled for doppler today at 2pm.  Dr. [name] will discuss the results of the doppler with the pt.

## 2023-12-28 NOTE — Telephone Encounter (Addendum)
 Patient called and advised that she is having increased pain and swelling in left leg.  Patient is currently following at [facility] with Dr. [name]. I recommended that she call her office for further instruction. She declined to call them, therefore advised that she be evaluated in the ER since her pain has progressed over the past couple of days.  Verbalized understanding and Dr. [name] made aware of situation.

## 2023-12-29 ENCOUNTER — Inpatient Hospital Stay: Admitting: Genetic Counselor

## 2023-12-29 ENCOUNTER — Inpatient Hospital Stay (HOSPITAL_BASED_OUTPATIENT_CLINIC_OR_DEPARTMENT_OTHER): Admitting: Hematology

## 2023-12-29 ENCOUNTER — Encounter: Payer: Self-pay | Admitting: Hematology

## 2023-12-29 ENCOUNTER — Inpatient Hospital Stay

## 2023-12-29 VITALS — BP 110/76 | HR 85 | Temp 97.3°F | Resp 21 | Ht 61.0 in | Wt 164.2 lb

## 2023-12-29 DIAGNOSIS — I82402 Acute embolism and thrombosis of unspecified deep veins of left lower extremity: Secondary | ICD-10-CM | POA: Diagnosis not present

## 2023-12-29 DIAGNOSIS — I82492 Acute embolism and thrombosis of other specified deep vein of left lower extremity: Secondary | ICD-10-CM

## 2023-12-29 NOTE — Progress Notes (Signed)
 [facility]   Telephone:([phone] Fax:([phone]) [phone]   Clinic Follow up Note   Patient Care Team: Mary Pulling, MD as PCP - General (Family Medicine) Amanda Jungling, Joyceann No, MD as PCP - Cardiology (Cardiology) Verona Goodwill, MD as PCP - Electrophysiology (Cardiology) Merriam Abbey, DO as Consulting Physician (Neurology) Sonja [place], MD as Consulting Physician (Hematology and Oncology)  Date of Service:  12/29/2023  CHIEF COMPLAINT: left leg pain   CURRENT THERAPY:  Eliquis  5 mg twice daily  Assessment & Plan DVT Previous DVT in the left leg has resolved, confirmed by a recent ultrasound showing no blood clots yesterday. Currently on Eliquis , planned to continue until early August, completing a six-month course from the initial diagnosis on February 16. - Cancel the previously scheduled ultrasound for August 8, as the recent ultrasound confirmed resolution of DVT. - Schedule lab work for August 8 after stopping Eliquis  for at least a week. - Coordinate follow-up with Dr. Orvis Blare after lab work in August.  Leg pain Chronic leg pain in the left calf, not associated with walking or swelling. Pain is intermittent and not relieved by anticoagulation, suggesting a non-thrombotic cause. Differential diagnosis includes possible rheumatological disorder, given associated symptoms of joint pain, numbness, tingling, and scalp burning. - Recommend using acetaminophen  once or twice a day for pain management. - Advise discussing leg pain and associated symptoms with primary care physician for further evaluation, including potential rheumatological screening tests. - Consider referral to a rheumatologist if indicated by primary care evaluation.  Hypertension Reported a blood pressure reading of 178/80 taken in the leg due to pain. The significance of this reading is unclear, and further evaluation may be needed.  Anemia Anemia has resolved, and current blood counts are  normal.  Plan - I reviewed her Doppler of left lower extremity from yesterday, which was negative for DVT -I recommended her to use Tylenol  as needed for less pain -Follow-up with PCP, patient is concerned about rheumatological disorders. - Reschedule missed genetic counseling appointment today. - Schedule follow-up with Dr. Orvis Blare after lab work in August. -I will see her as needed in future     Discussed the use of AI scribe software for clinical note transcription with the patient, who gave verbal consent to proceed.  History of Present Illness The patient, a 53 year old female with a history of deep vein thrombosis (DVT), presents for follow-up of persistent left leg pain. The pain, which predates the DVT, has not improved despite anticoagulation therapy. The patient rates the pain as a 5 out of 10 and denies any correlation with walking. The pain is located in the calf and is associated with occasional cramps. The patient also reports numbness and tingling in both hands and toes, a burning sensation in the scalp, and intermittent shortness of breath. The patient denies any current anemia.     All other systems were reviewed with the patient and are negative.  MEDICAL HISTORY:  Past Medical History:  Diagnosis Date   Abnormal pap 05/2012   ASCUS + HPV, 11/14 LGSIL   Anxiety    Asthma    Cardiac defibrillator in place    Chronic systolic CHF (congestive heart failure) (HCC)    a. 02/2015 Echo: EF 20%, sev dil w/ diff HK, worse @ inf base. Tiv AI, mild MR, mod dil LA, PASP .   Functional dyspepsia    early satiety    GERD with stricture    dilated 2010   HELICOBACTER PYLORI  GASTRITIS 01/15/2009   dyspnea, ? reaction to Biaxin/amoxicillin  Pylera 01/15/09 - completed therapy      IBS (irritable bowel syndrome)    NICM (nonischemic cardiomyopathy) (HCC)    a. 02/2015 Cath: nl cors, EF 15%, mild PAH;  b. 02/2015 Enrolled in VEST trial.   Sickle cell trait (HCC)     Situational depression    "son died"   Vitamin B 12 deficiency 11/2022    SURGICAL HISTORY: Past Surgical History:  Procedure Laterality Date   BIV ICD GENERATOR CHANGEOUT N/A 01/24/2021   Procedure: BIV ICD GENERATOR CHANGEOUT;  Surgeon: Verona Goodwill, MD;  Location: [facility] INVASIVE CV LAB;  Service: Cardiovascular;  Laterality: N/A;   CARDIAC CATHETERIZATION N/A 02/26/2015   Procedure: Right/Left Heart Cath and Coronary Angiography;  Surgeon: Millicent Ally, MD;  Location: [facility] INVASIVE CV LAB;  Service: Cardiovascular;  Laterality: N/A;   COLPOSCOPY  09/08/2011   neg   EP IMPLANTABLE DEVICE N/A 10/14/2015   Procedure: BiV ICD Insertion CRT-D;  Surgeon: Verona Goodwill, MD;  Location: [facility] INVASIVE CV LAB;  Service: Cardiovascular;  Laterality: N/A;   ESOPHAGOGASTRODUODENOSCOPY (EGD) WITH ESOPHAGEAL DILATION  11/30/2008   esophageal ring dilated 54 French, H. pylori gastritis   HARVEST BONE GRAFT  02/2023   TUBAL LIGATION  09/07/1989   WISDOM TOOTH EXTRACTION     WRIST SURGERY  2023    I have reviewed the social history and family history with the patient and they are unchanged from previous note.  ALLERGIES:  is allergic to clarithromycin, amoxicillin, meloxicam, spiriva respimat [tiotropium bromide monohydrate], and symbicort  [budesonide -formoterol  fumarate].  MEDICATIONS:  Current Outpatient Medications  Medication Sig Dispense Refill   albuterol  (VENTOLIN  HFA) 108 (90 Base) MCG/ACT inhaler Inhale 1-2 puffs into the lungs daily as needed.     apixaban  (ELIQUIS ) 5 MG TABS tablet Take 1 tablet (5 mg total) by mouth 2 (two) times daily. Start taking after completion of starter pack. 60 tablet 4   Azelastine  HCl 137 MCG/SPRAY SOLN Place 2 sprays into both nostrils 2 (two) times daily.     BREO ELLIPTA 200-25 MCG/ACT AEPB Inhale 1 puff into the lungs daily.     carvedilol  (COREG ) 25 MG tablet TAKE 1 TABLET (25 MG TOTAL) BY MOUTH TWICE A DAY WITH MEALS 180 tablet 1   clobetasol ointment  (TEMOVATE) 0.05 % Apply 1 Application topically 2 (two) times daily.     Cyanocobalamin  (VITAMIN B-12 IJ) Inject as directed every 30 (thirty) days.     diazepam  (VALIUM ) 2 MG tablet Take 1 tablet (2 mg total) by mouth every 8 (eight) hours as needed (dizziness). 20 tablet 0   ENTRESTO  24-26 MG TAKE 1 TABLET BY MOUTH TWICE A DAY 60 tablet 0   EPINEPHrine  (EPIPEN  2-PAK) 0.3 mg/0.3 mL IJ SOAJ injection Inject 0.3 mg into the muscle as needed for anaphylaxis. 2 each 1   ferrous sulfate  325 (65 FE) MG EC tablet Take 1 tablet (325 mg total) by mouth every other day. 45 tablet 3   fluocinonide ointment (LIDEX) 0.05 % Apply 1 Application topically daily as needed (eczema).     fluticasone  (FLONASE) 50 MCG/ACT nasal spray Place 2 sprays into both nostrils daily.     furosemide  (LASIX ) 40 MG tablet Take 1 tablet (40 mg total) by mouth daily. 30 tablet 5   Galcanezumab -gnlm (EMGALITY ) 120 MG/ML SOAJ INJECT 240 MG INTO THE SKIN ONCE FOR 1 DOSE. LOADING DOSE (Patient taking differently: Inject 240 mg into the skin  every 30 (thirty) days.) 1 mL 1   meclizine  (ANTIVERT ) 25 MG tablet Take 1 tablet (25 mg total) by mouth 3 (three) times daily as needed for dizziness. 20 tablet 0   nitroGLYCERIN  (NITROSTAT ) 0.4 MG SL tablet Place 1 tablet (0.4 mg total) under the tongue every 5 (five) minutes x 3 doses as needed for chest pain. 25 tablet 12   pantoprazole  (PROTONIX ) 40 MG tablet Take 40 mg by mouth daily.     sertraline  (ZOLOFT ) 100 MG tablet Take 100 mg by mouth daily.     spironolactone  (ALDACTONE ) 25 MG tablet TAKE 1 TABLET (25 MG TOTAL) BY MOUTH DAILY. 90 tablet 1   Vitamin D, Ergocalciferol, (DRISDOL) 1.25 MG (50000 UNIT) CAPS capsule Take 50,000 Units by mouth once a week.     No current facility-administered medications for this visit.    PHYSICAL EXAMINATION: ECOG PERFORMANCE STATUS: 1 - Symptomatic but completely ambulatory  Vitals:   12/29/23 1130  BP: 110/76  Pulse: 85  Resp: (!) 21  Temp: (!)  97.3 F (36.3 C)  SpO2: 96%   Wt Readings from Last 3 Encounters:  12/29/23 164 lb 3.2 oz (74.5 kg)  12/14/23 165 lb 4.8 oz (75 kg)  11/30/23 166 lb 3.2 oz (75.4 kg)     GENERAL:alert, no distress and comfortable SKIN: skin color, texture, turgor are normal, no rashes or significant lesions EYES: normal, Conjunctiva are pink and non-injected, sclera clear Musculoskeletal:no cyanosis of digits and no clubbing, no leg edema, mild calf tenderness in the left leg NEURO: alert & oriented x 3 with fluent speech, no focal motor/sensory deficits  Physical Exam   LABORATORY DATA:  I have reviewed the data as listed    Latest Ref Rng & Units 12/14/2023    3:35 PM 10/29/2023    8:18 PM 10/26/2023    1:07 PM  CBC  WBC 4.0 - 10.5 K/uL 6.6  6.7  6.0   Hemoglobin 12.0 - 15.0 g/dL 47.8  29.5  62.1   Hematocrit 36.0 - 46.0 % 37.2  35.8  37.2   Platelets 150 - 400 K/uL 250  215  206         Latest Ref Rng & Units 12/14/2023    3:35 PM 11/24/2023   11:05 AM 10/29/2023    8:18 PM  CMP  Glucose 70 - 99 mg/dL 86  308  99   BUN 6 - 20 mg/dL 13  12  13    Creatinine 0.44 - 1.00 mg/dL 6.57  8.46  9.62   Sodium 135 - 145 mmol/L 139  136  135   Potassium 3.5 - 5.1 mmol/L 4.1  3.5  4.1   Chloride 98 - 111 mmol/L 104  101  96   CO2 22 - 32 mmol/L 28  25  27    Calcium 8.9 - 10.3 mg/dL 9.8  9.4  9.5   Total Protein 6.5 - 8.1 g/dL 8.2     Total Bilirubin 0.0 - 1.2 mg/dL 0.4     Alkaline Phos 38 - 126 U/L 95     AST 15 - 41 U/L 16     ALT 0 - 44 U/L 16         RADIOGRAPHIC STUDIES: I have personally reviewed the radiological images as listed and agreed with the findings in the report. VAS US  LOWER EXTREMITY VENOUS (DVT) Result Date: 12/28/2023  Lower Venous DVT Study Patient Name:  DEVA RON  Date of Exam:   12/28/2023  Medical Rec #: [id number]       Accession #:    [id number] Date of Birth: 01/07/1971      Patient Gender: F Patient Age:   6 years Exam Location:  [facility] Procedure:       VAS US  LOWER EXTREMITY VENOUS (DVT) Referring Phys: Sonja [place] --------------------------------------------------------------------------------  Indications: Pain.  Risk Factors: DVT LLE 10/24/2023. Anticoagulation: Eliquis . Comparison Study: Previous exam on 10/24/2023 was positive for LLE DVT (ATV) Performing Technologist: Arlyce Berger RVT, RDMS  Examination Guidelines: A complete evaluation includes B-mode imaging, spectral Doppler, color Doppler, and power Doppler as needed of all accessible portions of each vessel. Bilateral testing is considered an integral part of a complete examination. Limited examinations for reoccurring indications may be performed as noted. The reflux portion of the exam is performed with the patient in reverse Trendelenburg.  +-----+---------------+---------+-----------+----------+--------------+ RIGHTCompressibilityPhasicitySpontaneityPropertiesThrombus Aging +-----+---------------+---------+-----------+----------+--------------+ CFV  Full           Yes      Yes                                 +-----+---------------+---------+-----------+----------+--------------+   +---------+---------------+---------+-----------+----------+--------------+ LEFT     CompressibilityPhasicitySpontaneityPropertiesThrombus Aging +---------+---------------+---------+-----------+----------+--------------+ CFV      Full           Yes      Yes                                 +---------+---------------+---------+-----------+----------+--------------+ SFJ      Full                                                        +---------+---------------+---------+-----------+----------+--------------+ FV Prox  Full           Yes      Yes                                 +---------+---------------+---------+-----------+----------+--------------+ FV Mid   Full           Yes      Yes                                  +---------+---------------+---------+-----------+----------+--------------+ FV DistalFull           Yes      Yes                                 +---------+---------------+---------+-----------+----------+--------------+ PFV      Full                                                        +---------+---------------+---------+-----------+----------+--------------+ POP      Full           Yes      Yes                                 +---------+---------------+---------+-----------+----------+--------------+  PTV      Full                                                        +---------+---------------+---------+-----------+----------+--------------+ PERO     Full                                                        +---------+---------------+---------+-----------+----------+--------------+ ATV                                                                  +---------+---------------+---------+-----------+----------+--------------+    Summary: RIGHT: - No evidence of common femoral vein obstruction.   LEFT: - There is no evidence of deep vein thrombosis in the lower extremity.  - No cystic structure found in the popliteal fossa.  *See table(s) above for measurements and observations. Electronically signed by Angela Kell MD on 12/28/2023 at 5:09:18 PM.    Final       No orders of the defined types were placed in this encounter.  All questions were answered. The patient knows to call the clinic with any problems, questions or concerns. No barriers to learning was detected. The total time spent in the appointment was 20 minutes.     Sonja Becker, MD 12/29/2023

## 2023-12-30 ENCOUNTER — Ambulatory Visit (HOSPITAL_COMMUNITY)
Admission: RE | Admit: 2023-12-30 | Discharge: 2023-12-30 | Disposition: A | Discharge status: Home / Self Care | Source: Ambulatory Visit | Attending: Family Medicine | Admitting: Family Medicine

## 2023-12-30 DIAGNOSIS — R928 Other abnormal and inconclusive findings on diagnostic imaging of breast: Secondary | ICD-10-CM | POA: Insufficient documentation

## 2024-01-01 ENCOUNTER — Encounter: Payer: Self-pay | Admitting: Cardiology

## 2024-01-03 ENCOUNTER — Ambulatory Visit (INDEPENDENT_AMBULATORY_CARE_PROVIDER_SITE_OTHER): Payer: BC Managed Care – PPO | Admitting: Internal Medicine

## 2024-01-03 ENCOUNTER — Other Ambulatory Visit: Payer: Self-pay | Admitting: Nurse Practitioner

## 2024-01-03 ENCOUNTER — Other Ambulatory Visit (INDEPENDENT_AMBULATORY_CARE_PROVIDER_SITE_OTHER)

## 2024-01-03 ENCOUNTER — Telehealth: Payer: Self-pay

## 2024-01-03 ENCOUNTER — Encounter: Payer: Self-pay | Admitting: Internal Medicine

## 2024-01-03 VITALS — BP 110/60 | HR 88 | Ht 61.0 in | Wt 161.0 lb

## 2024-01-03 DIAGNOSIS — R1013 Epigastric pain: Secondary | ICD-10-CM | POA: Diagnosis not present

## 2024-01-03 DIAGNOSIS — Z7901 Long term (current) use of anticoagulants: Secondary | ICD-10-CM

## 2024-01-03 DIAGNOSIS — R6881 Early satiety: Secondary | ICD-10-CM | POA: Diagnosis not present

## 2024-01-03 DIAGNOSIS — M79605 Pain in left leg: Secondary | ICD-10-CM

## 2024-01-03 DIAGNOSIS — I82492 Acute embolism and thrombosis of other specified deep vein of left lower extremity: Secondary | ICD-10-CM

## 2024-01-03 DIAGNOSIS — K921 Melena: Secondary | ICD-10-CM

## 2024-01-03 LAB — CBC WITH DIFFERENTIAL/PLATELET
Basophils Absolute: 0 10*3/uL (ref 0.0–0.1)
Basophils Relative: 0.8 % (ref 0.0–3.0)
Eosinophils Absolute: 0.2 10*3/uL (ref 0.0–0.7)
Eosinophils Relative: 3 % (ref 0.0–5.0)
HCT: 39.2 % (ref 36.0–46.0)
Hemoglobin: 13 g/dL (ref 12.0–15.0)
Lymphocytes Relative: 31 % (ref 12.0–46.0)
Lymphs Abs: 2 10*3/uL (ref 0.7–4.0)
MCHC: 33 g/dL (ref 30.0–36.0)
MCV: 82.3 fl (ref 78.0–100.0)
Monocytes Absolute: 0.5 10*3/uL (ref 0.1–1.0)
Monocytes Relative: 7.9 % (ref 3.0–12.0)
Neutro Abs: 3.6 10*3/uL (ref 1.4–7.7)
Neutrophils Relative %: 57.3 % (ref 43.0–77.0)
Platelets: 188 10*3/uL (ref 150.0–400.0)
RBC: 4.77 Mil/uL (ref 3.87–5.11)
RDW: 15.5 % (ref 11.5–15.5)
WBC: 6.3 10*3/uL (ref 4.0–10.5)

## 2024-01-03 NOTE — Telephone Encounter (Signed)
 Heavenlee informed and verbalized understanding to hold the Eliquis  2 days prior to procedure.

## 2024-01-03 NOTE — Patient Instructions (Signed)
 Your provider has requested that you go to the basement level for lab work before leaving today. Press "B" on the elevator. The lab is located at the first door on the left as you exit the elevator.  Due to recent changes in healthcare laws, you may see the results of your imaging and laboratory studies on MyChart before your provider has had a chance to review them.  We understand that in some cases there may be results that are confusing or concerning to you. Not all laboratory results come back in the same time frame and the provider may be waiting for multiple results in order to interpret others.  Please give us  48 hours in order for your provider to thoroughly review all the results before contacting the office for clarification of your results.   You have been scheduled for an endoscopy. Please follow written instructions given to you at your visit today.  If you use inhalers (even only as needed), please bring them with you on the day of your procedure.  If you take any of the following medications, they will need to be adjusted prior to your procedure:   DO NOT TAKE 7 DAYS PRIOR TO TEST- Trulicity (dulaglutide) Ozempic, Wegovy (semaglutide) Mounjaro (tirzepatide) Bydureon Bcise (exanatide extended release)  DO NOT TAKE 1 DAY PRIOR TO YOUR TEST Rybelsus (semaglutide) Adlyxin (lixisenatide) Victoza (liraglutide) Byetta (exanatide) ___________________________________________________________________________  Mary Pratt will be contaced by our office prior to your procedure for directions on holding your Eliquis .  If you do not hear from our office 1 week prior to your scheduled procedure, please call [phone] to discuss. Ask for Mary Pratt, CMA  I appreciate the opportunity to care for you. Loy Ruff, MD, [facility]

## 2024-01-03 NOTE — Telephone Encounter (Signed)
  Mary Pratt Jan 27, 1971 [id number]    Dear Dr [name]:  We have scheduled the above named patient for a(n) EGD procedure. Our records show that (s)he is on anticoagulation therapy.  Please advise as to whether the patient may come off their therapy of Eliquis  2 days prior to their procedure which is scheduled for 01/20/2024.  Please route your response to Nyomi Howser [place], CMA (AAMA)  or fax response to [phone].  Sincerely,    [facility]

## 2024-01-03 NOTE — Progress Notes (Signed)
 Mary Pratt 53 y.o. 09-27-1970 [id number]  Assessment & Plan:   Encounter Diagnoses  Name Primary?   Early satiety Yes   Dyspepsia    Black stool    Long term current use of anticoagulant    Patient is very concerned about a problem with her stomach.  We have decided to repeat an EGD.  I will plan to take biopsies including duodenal biopsies.  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  She was Hemoccult negative today with brown stool.  She could have had some bleeding and is at higher risk being on an anticoagulant.  We will check a CBC also.  It is possible she has H. pylori again.  With some of her symptoms I wonder about the use of buspirone  to help functional dyspepsia.  She has used that in the past but it has come off her list.  Breath testing for SIBO was negative in March 2024.  The risks and benefits as well as alternatives of endoscopic procedure(s) have been discussed and reviewed. All questions answered. The patient agrees to proceed.  CC: Leesa Pulling, MD   Subjective:   Chief Complaint: Bloating, black stools  HPI Mary Pratt is a 53 year old female with functional dyspepsia, IBS and chronic recurrent abdominal pain, CHF sclerosing mesenteritis hypertension and GERD who presents with bloating and black stools.  She experiences bloating that begins upon waking and persists throughout the day, worsening after meals. The bloating is accompanied by a sensation of fullness and early satiety, with relief noted when she refrains from eating and consumes only fluids. Despite bowel movements, the bloating does not subside, and she often feels incomplete evacuation.  She has alternating hard or constipated like stools and then softer loose stools but moves her bowels most days.  She reports black stools but denies the use of medications like Pepto Bismol or anti-inflammatories that could cause this.  She has had 1 or 2 stools that were all black  and then spots of black color change in some of her stools.  Her past medical workup includes a normal endoscopy approximately two and a half years ago and a normal blood count two weeks ago. A CT scan over a year ago was also unremarkable.  She has a history of H. pylori treated in the past, 2014.  She had persistent dyspeptic symptoms after that.  She is on pantoprazole .  SIBO testing was - March 2024 (negative breath test).  I do not think she has ever been tested for celiac disease.  10/06/2022 IMPRESSION: 1. No acute process in the abdomen or pelvis. 2. Small fat containing umbilical hernia. 3. Rounded hypodensities in the left lobe of the liver measure up to 11 mm and are favored as cysts. No follow-up necessary.  She experiences shortness of breath when lying on her back, but not during physical activities. She has a history of a blood clot and is currently on Eliquis . No leg swelling is noted, and her cardiologists have confirmed normal heart function.  EGD 05/22/2021 irregular Z-line otherwise normal Colonoscopy 05/22/2021 normal including terminal ileum   Wt Readings from Last 3 Encounters:  01/03/24 161 lb (73 kg)  12/29/23 164 lb 3.2 oz (74.5 kg)  12/14/23 165 lb 4.8 oz (75 kg)   Echo 10/2023:  1. Left ventricular ejection fraction, by estimation, is 55 to 60%. The  left ventricle has normal function. The left ventricle has no regional  wall motion  abnormalities. There is mild left ventricular hypertrophy.  Left ventricular diastolic parameters  are indeterminate. The global longitudinal strain is indeterminate.   2. Right ventricular systolic function is normal. The right ventricular  size is normal. Tricuspid regurgitation signal is inadequate for assessing  PA pressure.   3. The mitral valve is normal in structure. No evidence of mitral valve  regurgitation. No evidence of mitral stenosis.   4. The tricuspid valve is abnormal.   5. The aortic valve is tricuspid. Aortic  valve regurgitation is not  visualized. No aortic stenosis is present.   Comparison(s): A prior study was performed on 06/25/2021. EF 50-55%. Mild LVH.   Allergies  Allergen Reactions   Clarithromycin Hives   Amoxicillin Palpitations    REACTION: Tacycardia   Meloxicam Rash   Spiriva Respimat [Tiotropium Bromide Monohydrate] Rash   Symbicort  [Budesonide -Formoterol  Fumarate] Rash   Current Meds  Medication Sig   albuterol  (VENTOLIN  HFA) 108 (90 Base) MCG/ACT inhaler Inhale 1-2 puffs into the lungs daily as needed.   apixaban  (ELIQUIS ) 5 MG TABS tablet Take 1 tablet (5 mg total) by mouth 2 (two) times daily. Start taking after completion of starter pack.   Azelastine  HCl 137 MCG/SPRAY SOLN Place 2 sprays into both nostrils 2 (two) times daily.   BREO ELLIPTA 200-25 MCG/ACT AEPB Inhale 1 puff into the lungs daily.   carvedilol  (COREG ) 25 MG tablet TAKE 1 TABLET (25 MG TOTAL) BY MOUTH TWICE A DAY WITH MEALS   clobetasol ointment (TEMOVATE) 0.05 % Apply 1 Application topically 2 (two) times daily.   Cyanocobalamin  (VITAMIN B-12 IJ) Inject as directed every 30 (thirty) days.   ENTRESTO  24-26 MG TAKE 1 TABLET BY MOUTH TWICE A DAY   EPINEPHrine  (EPIPEN  2-PAK) 0.3 mg/0.3 mL IJ SOAJ injection Inject 0.3 mg into the muscle as needed for anaphylaxis.   fluocinonide ointment (LIDEX) 0.05 % Apply 1 Application topically daily as needed (eczema).   fluticasone  (FLONASE) 50 MCG/ACT nasal spray Place 2 sprays into both nostrils daily.   furosemide  (LASIX ) 40 MG tablet Take 1 tablet (40 mg total) by mouth daily.   Galcanezumab -gnlm (EMGALITY ) 120 MG/ML SOAJ INJECT 240 MG INTO THE SKIN ONCE FOR 1 DOSE. LOADING DOSE (Patient taking differently: Inject 240 mg into the skin every 30 (thirty) days.)   nitroGLYCERIN  (NITROSTAT ) 0.4 MG SL tablet Place 1 tablet (0.4 mg total) under the tongue every 5 (five) minutes x 3 doses as needed for chest pain.   pantoprazole  (PROTONIX ) 40 MG tablet Take 40 mg by mouth  daily.   sertraline  (ZOLOFT ) 100 MG tablet Take 100 mg by mouth daily.   spironolactone  (ALDACTONE ) 25 MG tablet TAKE 1 TABLET (25 MG TOTAL) BY MOUTH DAILY.   Vitamin D, Ergocalciferol, (DRISDOL) 1.25 MG (50000 UNIT) CAPS capsule Take 50,000 Units by mouth once a week.   Past Medical History:  Diagnosis Date   Abnormal pap 05/2012   ASCUS + HPV, 11/14 LGSIL   Anxiety    Asthma    Cardiac defibrillator in place    Chronic systolic CHF (congestive heart failure) (HCC)    a. 02/2015 Echo: EF 20%, sev dil w/ diff HK, worse @ inf base. Tiv AI, mild MR, mod dil LA, PASP .   DVT of lower extremity (deep venous thrombosis) (HCC)    Functional dyspepsia    early satiety    GERD with stricture    dilated 2010   HELICOBACTER PYLORI GASTRITIS 01/15/2009   dyspnea, ? reaction to Biaxin/amoxicillin  Pylera 01/15/09 - completed therapy      IBS (irritable bowel syndrome)    NICM (nonischemic cardiomyopathy) (HCC)    a. 02/2015 Cath: nl cors, EF 15%, mild PAH;  b. 02/2015 Enrolled in VEST trial.   Sickle cell trait (HCC)    Situational depression    "son died"   Vitamin B 12 deficiency 11/2022   Past Surgical History:  Procedure Laterality Date   BIV ICD GENERATOR CHANGEOUT N/A 01/24/2021   Procedure: BIV ICD GENERATOR CHANGEOUT;  Surgeon: Verona Goodwill, MD;  [facility] INVAS[facility] CV LAB;  Service: Cardiovascular;  Laterality: N/A;   CARDIAC CATHETERIZATION N/A 02/26/2015   Procedure: Right/Left Heart Cath and Coronary Angiography;  Surgeon: Millicent Ally, MD;  Location: [facility] INVASIVE CV LAB;  Service: Cardiovascular;  Laterality: N/A;   COLPOSCOPY  09/08/2011   neg   EP IMPLANTABLE DEVICE N/A 10/14/2015   Procedure: BiV ICD Insertion CRT-D;  Surgeon: Verona Goodwill, MD;  Location: [facility] INVASIVE CV LAB;  Service: Cardiovascular;  Laterality: N/A;   ESOPHAGOGASTRODUODENOSCOPY (EGD) WITH ESOPHAGEAL DILATION  11/30/2008   esophageal ring dilated 54 French, H. 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   Review of Systems As per HPI  Objective:   Physical Exam @BP  110/60   Pulse 88   Ht 5\' 1"  (1.549 m)   Wt 161 lb (73 kg)   LMP 09/14/2022 (Approximate)   BMI 30.42 kg/m @  General:  NAD Eyes:   anicteric Lungs:  clear Heart::  S1S2 no rubs, murmurs or gallops Abdomen:  soft and nontender, BS+ obese Ext:   no edema, cyanosis or clubbing  Darrol Emmer, CMA present rectal Stool is brown and heme negative    Data Reviewed:  See HPI

## 2024-01-07 ENCOUNTER — Other Ambulatory Visit: Payer: Self-pay

## 2024-01-07 ENCOUNTER — Encounter: Payer: Self-pay | Admitting: Family Medicine

## 2024-01-07 ENCOUNTER — Ambulatory Visit: Admitting: Family Medicine

## 2024-01-07 VITALS — BP 110/82 | HR 86 | Temp 97.8°F | Resp 12 | Ht 61.0 in | Wt 165.0 lb

## 2024-01-07 DIAGNOSIS — J309 Allergic rhinitis, unspecified: Secondary | ICD-10-CM | POA: Insufficient documentation

## 2024-01-07 DIAGNOSIS — J4540 Moderate persistent asthma, uncomplicated: Secondary | ICD-10-CM | POA: Insufficient documentation

## 2024-01-07 DIAGNOSIS — J3089 Other allergic rhinitis: Secondary | ICD-10-CM

## 2024-01-07 DIAGNOSIS — J302 Other seasonal allergic rhinitis: Secondary | ICD-10-CM | POA: Insufficient documentation

## 2024-01-07 MED ORDER — ALBUTEROL SULFATE HFA 108 (90 BASE) MCG/ACT IN AERS: 1.0000 | INHALATION_SPRAY | RESPIRATORY_TRACT | 1 refills | Status: AC | PRN

## 2024-01-07 MED ORDER — FLUTICASONE PROPIONATE 50 MCG/ACT NA SUSP: 2.0000 | Freq: Every morning | NASAL | 1 refills | Status: AC

## 2024-01-07 MED ORDER — EPINEPHRINE 0.3 MG/0.3ML IJ SOAJ: 0.3000 mg | INTRAMUSCULAR | 1 refills | Status: DC | PRN

## 2024-01-07 MED ORDER — FLUTICASONE-SALMETEROL 250-50 MCG/ACT IN AEPB: 1.0000 | INHALATION_SPRAY | Freq: Every morning | RESPIRATORY_TRACT | 2 refills | Status: DC

## 2024-01-07 NOTE — Progress Notes (Signed)
 [address] [place]  [address] Dept: [phone]  FOLLOW UP NOTE  Patient ID: Mary Pratt, adult    DOB: 08/16/1971  Age: 53 y.o. MRN: [id number] Date of Office Visit: 01/07/2024  Assessment  Chief Complaint: Follow-up (Asthma/Allergies/)  HPI Mary Pratt is a 53 year old female who presents to the clinic for a follow-up visit.  She was last seen in this clinic on 10/25/2018 for by Dr. Idolina Maker as a new patient for evaluation of asthma and allergic rhinitis.  Her problem list currently includes sickle cell trait, congestive heart failure, status post ICD, and DVT on Eliquis .    At today's visit, she reports that her asthma has been well-controlled with no symptoms including shortness of breath, cough, or wheeze with activity or rest.  She continues Breo 200-1 puff once a day and uses albuterol  about once or twice a week with relief of symptoms.  She does request a change back to Advair 250 as she feels this medication provided more relief.    Allergic rhinitis is reported as moderately well-controlled with nasal congestion and dry nostrils as the main symptoms.  She continues Flonase occasionally and is not currently taking an antihistamine.  She is not currently using a nasal saline rinse or nasal saline gel.  Her last allergy  injection was on 11/24/2023.  At that time, she began to experience illness including DVT and was unable to get to the clinic.  She is interested in restarting allergen immunotherapy at today's visit.  She began allergen immunotherapy on 04/26/2023 directed toward grass pollen, weed pollen, dog, dust mite, cockroach, and mold.  Epinephrine  autoinjector reordered at today's visit.  Her current medications are listed in the chart.  Drug Allergies:  Allergies  Allergen Reactions   Clarithromycin Hives   Amoxicillin Palpitations    REACTION: Tacycardia   Meloxicam Rash   Spiriva Respimat [Tiotropium Bromide Monohydrate] Rash   Symbicort   [Budesonide -Formoterol  Fumarate] Rash    Physical Exam: BP 110/82   Pulse 86   Temp 97.8 F (36.6 C)   Resp 12   Ht 5\' 1"  (1.549 m)   Wt 165 lb (74.8 kg)   LMP 09/14/2022 (Approximate)   SpO2 97%   BMI 31.18 kg/m    Physical Exam Vitals reviewed.  Constitutional:      Appearance: Normal appearance.  HENT:     Head: Normocephalic and atraumatic.     Right Ear: Tympanic membrane normal.     Left Ear: Tympanic membrane normal.     Nose:     Comments: Bilateral nares edematous and pale with thin clear nasal drainage noted.  Pharynx is slightly erythematous with no exudate.  Left-sided tonsillar stone noted.  Ears normal.  Eyes normal.    Mouth/Throat:     Pharynx: Oropharynx is clear.  Eyes:     Conjunctiva/sclera: Conjunctivae normal.  Cardiovascular:     Rate and Rhythm: Normal rate and regular rhythm.     Heart sounds: Normal heart sounds. No murmur heard. Pulmonary:     Effort: Pulmonary effort is normal.     Breath sounds: Normal breath sounds.     Comments: Lungs clear to auscultation Musculoskeletal:        General: Normal range of motion.     Cervical back: Normal range of motion and neck supple.  Skin:    General: Skin is warm and dry.  Neurological:     Mental Status: She is alert and oriented to person, place, and time.  Psychiatric:        Mood and Affect: Mood normal.        Behavior: Behavior normal.        Thought Content: Thought content normal.        Judgment: Judgment normal.     Diagnostics: FVC 2.48 which is 100% predicted value, FEV1 2.01 which is 101% of predicted value.  Spirometry indicates normal ventilatory function.  Assessment and Plan: 1. Moderate persistent asthma, uncomplicated   2. Seasonal and perennial allergic rhinitis     Meds ordered this encounter  Medications   fluticasone  (FLONASE) 50 MCG/ACT nasal spray    Sig: Place 2 sprays into both nostrils every morning.    Dispense:  48 g    Refill:  1    fluticasone -salmeterol (ADVAIR DISKUS) 250-50 MCG/ACT AEPB    Sig: Inhale 1 puff into the lungs in the morning.    Dispense:  60 each    Refill:  2   albuterol  (VENTOLIN  HFA) 108 (90 Base) MCG/ACT inhaler    Sig: Inhale 1-2 puffs into the lungs every 4 (four) hours as needed.    Dispense:  18 g    Refill:  1   EPINEPHrine  (EPIPEN  2-PAK) 0.3 mg/0.3 mL IJ SOAJ injection    Sig: Inject 0.3 mg into the muscle as needed for anaphylaxis.    Dispense:  2 each    Refill:  1    Patient Instructions  Asthma We will try to reorder Advair 250-1 puff once a day to prevent cough or wheeze. If not covered will try Wixela Continue albuterol  2 puffs once every 4 hours if needed for cough or wheeze You may use albuterol  2 puffs 5 to 15 minutes before activity to decrease cough or wheeze  Allergic rhinitis Continue allergen avoidance measures directed toward grass pollen, weed pollen, dog, dust mite, cockroach, and mold as listed below Continue an antihistamine once a day if needed for runny nose or itch Continue Flonase 2 sprays each nostril once a day if needed for stuffy nose. In the right nostril, point the applicator out toward the right ear. In the left nostril, point the applicator out toward the left ear Consider saline nasal rinses as needed for nasal symptoms. Use this before any medicated nasal sprays for best result Restart allergen immunotherapy and have access to an epinephrine  autoinjector set per protocol  Call the clinic if this treatment plan is not working well for you.  Follow up in 6 months or sooner if needed.   Return in about 6 months (around 07/09/2024), or if symptoms worsen or fail to improve.    Thank you for the opportunity to care for this patient.  Please do not hesitate to contact me with questions.  Marinus Sic, FNP [facility] 

## 2024-01-07 NOTE — Patient Instructions (Signed)
 Asthma We will try to reorder Advair 250-1 puff once a day to prevent cough or wheeze. If not covered will try Wixela Continue albuterol  2 puffs once every 4 hours if needed for cough or wheeze You may use albuterol  2 puffs 5 to 15 minutes before activity to decrease cough or wheeze  Allergic rhinitis Continue allergen avoidance measures directed toward grass pollen, weed pollen, dog, dust mite, cockroach, and mold as listed below Continue an antihistamine once a day if needed for runny nose or itch Continue Flonase 2 sprays each nostril once a day if needed for stuffy nose. In the right nostril, point the applicator out toward the right ear. In the left nostril, point the applicator out toward the left ear Consider saline nasal rinses as needed for nasal symptoms. Use this before any medicated nasal sprays for best result Restart allergen immunotherapy and have access to an epinephrine  autoinjector set per protocol  Call the clinic if this treatment plan is not working well for you.  Follow up in 6 months or sooner if needed.  Reducing Pollen Exposure The American Academy of Allergy , Asthma and Immunology suggests the following steps to reduce your exposure to pollen during allergy  seasons. Do not hang sheets or clothing out to dry; pollen may collect on these items. Do not mow lawns or spend time around freshly cut grass; mowing stirs up pollen. Keep windows closed at night.  Keep car windows closed while driving. Minimize morning activities outdoors, a time when pollen counts are usually at their highest. Stay indoors as much as possible when pollen counts or humidity is high and on windy days when pollen tends to remain in the air longer. Use air conditioning when possible.  Many air conditioners have filters that trap the pollen spores. Use a HEPA room air filter to remove pollen form the indoor air you breathe.  Control of Dog or Cat Allergen Avoidance is the best way to manage a dog  or cat allergy . If you have a dog or cat and are allergic to dog or cats, consider removing the dog or cat from the home. If you have a dog or cat but don't want to find it a new home, or if your family wants a pet even though someone in the household is allergic, here are some strategies that may help keep symptoms at bay:  Keep the pet out of your bedroom and restrict it to only a few rooms. Be advised that keeping the dog or cat in only one room will not limit the allergens to that room. Don't pet, hug or kiss the dog or cat; if you do, wash your hands with soap and water. High-efficiency particulate air (HEPA) cleaners run continuously in a bedroom or living room can reduce allergen levels over time. Regular use of a high-efficiency vacuum cleaner or a central vacuum can reduce allergen levels. Giving your dog or cat a bath at least once a week can reduce airborne allergen.  Control of Mold Allergen Mold and fungi can grow on a variety of surfaces provided certain temperature and moisture conditions exist.  Outdoor molds grow on plants, decaying vegetation and soil.  The major outdoor mold, Alternaria and Cladosporium, are found in very high numbers during hot and dry conditions.  Generally, a late Summer - Fall peak is seen for common outdoor fungal spores.  Rain will temporarily lower outdoor mold spore count, but counts rise rapidly when the rainy period ends.  The most important indoor  molds are Aspergillus and Penicillium.  Dark, humid and poorly ventilated basements are ideal sites for mold growth.  The next most common sites of mold growth are the bathroom and the kitchen.  Outdoor [employer] Use air conditioning and keep windows closed Avoid exposure to decaying vegetation. Avoid leaf raking. Avoid grain handling. Consider wearing a face mask if working in moldy areas.  Indoor Mold Control Maintain humidity below 50%. Clean washable surfaces with 5% bleach solution. Remove sources  e.g. Contaminated carpets.   Control of Dust Mite Allergen Dust mites play a major role in allergic asthma and rhinitis. They occur in environments with high humidity wherever human skin is found. Dust mites absorb humidity from the atmosphere (ie, they do not drink) and feed on organic matter (including shed human and animal skin). Dust mites are a microscopic type of insect that you cannot see with the naked eye. High levels of dust mites have been detected from mattresses, pillows, carpets, upholstered furniture, bed covers, clothes, soft toys and any woven material. The principal allergen of the dust mite is found in its feces. A gram of dust may contain 1,000 mites and 250,000 fecal particles. Mite antigen is easily measured in the air during house cleaning activities. Dust mites do not bite and do not cause harm to humans, other than by triggering allergies/asthma.  Ways to decrease your exposure to dust mites in your home:  1. Encase mattresses, box springs and pillows with a mite-impermeable barrier or cover  2. Wash sheets, blankets and drapes weekly in hot water (130 F) with detergent and dry them in a dryer on the hot setting.  3. Have the room cleaned frequently with a vacuum cleaner and a damp dust-mop. For carpeting or rugs, vacuuming with a vacuum cleaner equipped with a high-efficiency particulate air (HEPA) filter. The dust mite allergic individual should not be in a room which is being cleaned and should wait 1 hour after cleaning before going into the room.  4. Do not sleep on upholstered furniture (eg, couches).  5. If possible removing carpeting, upholstered furniture and drapery from the home is ideal. Horizontal blinds should be eliminated in the rooms where the person spends the most time (bedroom, study, television room). Washable vinyl, roller-type shades are optimal.  6. Remove all non-washable stuffed toys from the bedroom. Wash stuffed toys weekly like sheets and  blankets above.  7. Reduce indoor humidity to less than 50%. Inexpensive humidity monitors can be purchased at most hardware stores. Do not use a humidifier as can make the problem worse and are not recommended.  Control of Cockroach Allergen Cockroach allergen has been identified as an important cause of acute attacks of asthma, especially in urban settings.  There are fifty-five species of cockroach that exist in the [place] , however only three, the [place], [place] and [place] species produce allergen that can affect patients with Asthma.  Allergens can be obtained from fecal particles, egg casings and secretions from cockroaches.    Remove food sources. Reduce access to water. Seal access and entry points. Spray runways with 0.5-1% Diazinon or Chlorpyrifos Blow boric acid power under stoves and refrigerator. Place bait stations (hydramethylnon) at feeding sites.

## 2024-01-07 NOTE — Addendum Note (Signed)
 Addended by: Minor Amble on: 01/07/2024 05:51 PM   Modules accepted: Orders

## 2024-01-10 ENCOUNTER — Encounter: Payer: Self-pay | Admitting: Hematology

## 2024-01-10 ENCOUNTER — Telehealth: Payer: Self-pay | Admitting: *Deleted

## 2024-01-10 NOTE — Telephone Encounter (Signed)
 Patient called office this morning to report a new lump on her back and requested an appointment to see provider.  Reviewed chart with Sheril Dines, PAC.   We see her for management of DVT and B 12 deficiency.  Patient advised that this is a new problem that should be managed by her primary physician and if they feel she needs to be seen for a new diagnosis we will be glad to see her with a new referral from them.  Verbalized understanding and appointment for Wednesday 5/7 cancelled at this time.

## 2024-01-11 ENCOUNTER — Ambulatory Visit (HOSPITAL_COMMUNITY)
Admission: RE | Admit: 2024-01-11 | Discharge: 2024-01-11 | Disposition: A | Discharge status: Home / Self Care | Source: Ambulatory Visit | Attending: Nurse Practitioner | Admitting: Nurse Practitioner

## 2024-01-11 ENCOUNTER — Other Ambulatory Visit: Payer: Self-pay | Admitting: Allergy & Immunology

## 2024-01-11 DIAGNOSIS — M79604 Pain in right leg: Secondary | ICD-10-CM | POA: Diagnosis present

## 2024-01-11 DIAGNOSIS — M79605 Pain in left leg: Secondary | ICD-10-CM | POA: Diagnosis present

## 2024-01-11 DIAGNOSIS — I82492 Acute embolism and thrombosis of other specified deep vein of left lower extremity: Secondary | ICD-10-CM | POA: Diagnosis present

## 2024-01-12 ENCOUNTER — Telehealth: Payer: Self-pay

## 2024-01-12 ENCOUNTER — Ambulatory Visit (INDEPENDENT_AMBULATORY_CARE_PROVIDER_SITE_OTHER): Payer: Self-pay

## 2024-01-12 ENCOUNTER — Inpatient Hospital Stay: Admitting: Physician Assistant

## 2024-01-12 DIAGNOSIS — J309 Allergic rhinitis, unspecified: Secondary | ICD-10-CM

## 2024-01-12 NOTE — Telephone Encounter (Signed)
 Spoke with pt via telephone to inform pt that Dr. [name] reviewed her attachment of the US  of Lft Shoulder report pt sent via MyChart.  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  Pt verbalized understanding and had no further questions or concerns.

## 2024-01-13 ENCOUNTER — Encounter: Payer: Self-pay | Admitting: Obstetrics and Gynecology

## 2024-01-13 ENCOUNTER — Other Ambulatory Visit: Payer: Self-pay | Admitting: Cardiology

## 2024-01-13 ENCOUNTER — Encounter (HOSPITAL_COMMUNITY): Payer: Self-pay

## 2024-01-13 ENCOUNTER — Other Ambulatory Visit (HOSPITAL_COMMUNITY)
Admission: RE | Admit: 2024-01-13 | Discharge: 2024-01-13 | Disposition: A | Discharge status: Home / Self Care | Source: Ambulatory Visit | Attending: Obstetrics and Gynecology | Admitting: Obstetrics and Gynecology

## 2024-01-13 ENCOUNTER — Ambulatory Visit (INDEPENDENT_AMBULATORY_CARE_PROVIDER_SITE_OTHER): Admitting: Obstetrics and Gynecology

## 2024-01-13 VITALS — BP 120/80 | HR 84 | Ht 60.5 in | Wt 165.0 lb

## 2024-01-13 DIAGNOSIS — Z01419 Encounter for gynecological examination (general) (routine) without abnormal findings: Secondary | ICD-10-CM

## 2024-01-13 DIAGNOSIS — I5022 Chronic systolic (congestive) heart failure: Secondary | ICD-10-CM

## 2024-01-13 DIAGNOSIS — L819 Disorder of pigmentation, unspecified: Secondary | ICD-10-CM | POA: Diagnosis not present

## 2024-01-13 DIAGNOSIS — R140 Abdominal distension (gaseous): Secondary | ICD-10-CM | POA: Diagnosis not present

## 2024-01-13 DIAGNOSIS — N951 Menopausal and female climacteric states: Secondary | ICD-10-CM

## 2024-01-13 DIAGNOSIS — Z1331 Encounter for screening for depression: Secondary | ICD-10-CM | POA: Diagnosis not present

## 2024-01-13 DIAGNOSIS — I5032 Chronic diastolic (congestive) heart failure: Secondary | ICD-10-CM

## 2024-01-13 MED ORDER — VEOZAH 45 MG PO TABS: 1.0000 | ORAL_TABLET | Freq: Every day | ORAL | 2 refills | Status: DC

## 2024-01-13 NOTE — Telephone Encounter (Signed)
**Note De-identified  [place] Obfuscation** Please advise 

## 2024-01-13 NOTE — Progress Notes (Signed)
 53 y.o. y.o. female here for annual exam. Patient's last menstrual period was 09/14/2022 (approximate).   H/o DVT on blood thinner Having hot flashes, bloating, insomnia, fatigue, doesn't feel like herself Discussed either veozah and or intrarosa. Information provided on both. She would like to run her hormone panel. Samples of 2 boxes of veozah given. Lft's checked today. Denies any history of liver conditions besides a history of fatty liver. To repeat lft's in 3 months. [facility]. 01/13/24 pap collected 2022 colonoscopy To get pUS to evaluate bloating  Left labial hypopigmentation: to return for punch biopsy Body mass index is 31.69 kg/m.     01/13/2024    1:43 PM 10/15/2023    9:26 AM 04/02/2015    4:18 PM  Depression screen PHQ 2/9  Decreased Interest 3 0 0  Down, Depressed, Hopeless 1 0 0  PHQ - 2 Score 4 0 0  Altered sleeping 3    Tired, decreased energy 3    Change in appetite 3    Feeling bad or failure about yourself  0    Trouble concentrating 0    Moving slowly or fidgety/restless 0    Suicidal thoughts 0    PHQ-9 Score 13    Difficult doing work/chores Extremely dIfficult      Blood pressure 120/80, pulse 84, height 5' 0.5" (1.537 m), weight 165 lb (74.8 kg), last menstrual period 09/14/2022, SpO2 99%.  No results found for: "DIAGPAP", "HPVHIGH", "ADEQPAP"  GYN HISTORY: No results found for: "DIAGPAP", "HPVHIGH", "ADEQPAP"  OB History  Gravida Para Term Preterm AB Living  3 3 3   2   SAB IAB Ectopic Multiple Live Births      3    # Outcome Date GA Lbr Len/2nd Weight Sex Type Anes PTL Lv  3 Term 05/1998 [redacted]w[redacted]d  5 lb (2.268 kg) M Vag-Spont   LIV  2 Term 08/1994   6 lb (2.722 kg) M Vag-Spont   DEC  1 Term 03/1987 [redacted]w[redacted]d  6 lb (2.722 kg) F Vag-Spont   LIV    Past Medical History:  Diagnosis Date   Abnormal pap 05/2012   ASCUS + HPV, 11/14 LGSIL   Anxiety    Asthma    Cardiac defibrillator in place    Chronic systolic CHF (congestive heart  failure) (HCC)    a. 02/2015 Echo: EF 20%, sev dil w/ diff HK, worse @ inf base. Tiv AI, mild MR, mod dil LA, PASP .   DVT of lower extremity (deep venous thrombosis) (HCC)    Functional dyspepsia    early satiety    GERD with stricture    dilated 2010   HELICOBACTER PYLORI GASTRITIS 01/15/2009   dyspnea, ? reaction to Biaxin/amoxicillin  Pylera 01/15/09 - completed therapy      IBS (irritable bowel syndrome)    NICM (nonischemic cardiomyopathy) (HCC)    a. 02/2015 Cath: nl cors, EF 15%, mild PAH;  b. 02/2015 Enrolled in VEST trial.   Sickle cell trait (HCC)    Situational depression    "son died"   Vitamin B 12 deficiency 11/2022    Past Surgical History:  Procedure Laterality Date   BIV ICD GENERATOR CHANGEOUT N/A 01/24/2021   Procedure: BIV ICD GENERATOR CHANGEOUT;  Surgeon: Verona Goodwill, MD;  Location: [facility] INVASIVE CV LAB;  Service: Cardiovascular;  Laterality: N/A;   CARDIAC CATHETERIZATION N/A 02/26/2015   Procedure: Right/Left Heart Cath and Coronary Angiography;  Surgeon: Millicent Ally, MD;  Location: [facility] INVASIVE CV LAB;  Service: Cardiovascular;  Laterality: N/A;   COLPOSCOPY  09/08/2011   neg   EP IMPLANTABLE DEVICE N/A 10/14/2015   Procedure: BiV ICD Insertion CRT-D;  Surgeon: Verona Goodwill, MD;  Location: [facility] INVASIVE CV LAB;  Service: Cardiovascular;  Laterality: N/A;   ESOPHAGOGASTRODUODENOSCOPY (EGD) WITH ESOPHAGEAL DILATION  11/30/2008   esophageal ring dilated 54 French, H. pylori gastritis   HARVEST BONE GRAFT  02/2023   TUBAL LIGATION  09/07/1989   WISDOM TOOTH EXTRACTION     WRIST SURGERY  2023    Current Outpatient Medications on File Prior to Visit  Medication Sig Dispense Refill   albuterol  (VENTOLIN  HFA) 108 (90 Base) MCG/ACT inhaler Inhale 1-2 puffs into the lungs every 4 (four) hours as needed. 18 g 1   apixaban  (ELIQUIS ) 5 MG TABS tablet Take 1 tablet (5 mg total) by mouth 2 (two) times daily. Start taking after completion of starter pack. 60  tablet 4   Azelastine  HCl 137 MCG/SPRAY SOLN Place 2 sprays into both nostrils 2 (two) times daily.     BREO ELLIPTA 200-25 MCG/ACT AEPB Inhale 1 puff into the lungs daily.     carvedilol  (COREG ) 25 MG tablet TAKE 1 TABLET (25 MG TOTAL) BY MOUTH TWICE A DAY WITH MEALS 180 tablet 1   clobetasol ointment (TEMOVATE) 0.05 % Apply 1 Application topically 2 (two) times daily.     Cyanocobalamin  (VITAMIN B-12 IJ) Inject as directed every 30 (thirty) days.     fluocinonide ointment (LIDEX) 0.05 % Apply 1 Application topically daily as needed (eczema).     fluticasone  (FLONASE) 50 MCG/ACT nasal spray Place 2 sprays into both nostrils every morning. 48 g 1   furosemide  (LASIX ) 40 MG tablet Take 1 tablet (40 mg total) by mouth daily. 30 tablet 5   Galcanezumab -gnlm (EMGALITY ) 120 MG/ML SOAJ INJECT 240 MG INTO THE SKIN ONCE FOR 1 DOSE. LOADING DOSE (Patient taking differently: Inject 240 mg into the skin every 30 (thirty) days.) 1 mL 1   pantoprazole  (PROTONIX ) 40 MG tablet Take 40 mg by mouth daily.     sertraline  (ZOLOFT ) 100 MG tablet Take 100 mg by mouth daily.     spironolactone  (ALDACTONE ) 25 MG tablet TAKE 1 TABLET (25 MG TOTAL) BY MOUTH DAILY. 90 tablet 1   Vitamin D, Ergocalciferol, (DRISDOL) 1.25 MG (50000 UNIT) CAPS capsule Take 50,000 Units by mouth once a week.     EPINEPHRINE  0.3 mg/0.3 mL IJ SOAJ injection INJECT 0.3 MG INTO THE MUSCLE AS NEEDED FOR ANAPHYLAXIS. (Patient not taking: Reported on 01/13/2024) 2 each 1   fluticasone -salmeterol (ADVAIR DISKUS) 250-50 MCG/ACT AEPB Inhale 1 puff into the lungs in the morning. (Patient not taking: Reported on 01/13/2024) 60 each 2   nitroGLYCERIN  (NITROSTAT ) 0.4 MG SL tablet Place 1 tablet (0.4 mg total) under the tongue every 5 (five) minutes x 3 doses as needed for chest pain. (Patient not taking: Reported on 01/13/2024) 25 tablet 12   No current facility-administered medications on file prior to visit.    Social History   Socioeconomic History    Marital status: Single    Spouse name: Not on file   Number of children: 3   Years of education: Not on file   Highest education level: Not on file  Occupational History   Occupation: texturing operator    Employer: [employer]  Tobacco Use   Smoking status: Never    Passive exposure: Never   Smokeless tobacco: Never  Vaping Use   Vaping status: Never Used  Substance and Sexual Activity   Alcohol use: Yes    Comment: occasional wine   Drug use: No   Sexual activity: Yes    Partners: Male    Birth control/protection: Surgical    Comment: BTL  Other Topics Concern   Not on file  Social History Narrative   Single, 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   2 sons 1 daughter 1 son deceased   Drinks caffeine  up to a few a day, never smoker no drug use   Occ wine   Social Drivers of Corporate investment banker Strain: Not on file  Food Insecurity: No Food Insecurity (10/15/2023)   Hunger Vital Sign    Worried About Running Out of Food in the Last Year: Never true    Ran Out of Food in the Last Year: Never true  Transportation Needs: No Transportation Needs (10/15/2023)   PRAPARE - Administrator, Civil Service (Medical): No    Lack of Transportation (Non-Medical): No  Physical Activity: Not on file  Stress: Not on file  Social Connections: Unknown (01/20/2022)   [facility]   Social Network    Social Network: Not on file  Intimate Partner Violence: Not At Risk (10/15/2023)   Humiliation, Afraid, Rape, and Kick questionnaire    Fear of Current or Ex-Partner: No    Emotionally Abused: No    Physically Abused: No    Sexually Abused: No    Family History  Problem Relation Age of Onset   Diabetes Mother    Hypertension Mother    Hypothyroidism Mother    Breast cancer Maternal Aunt    Brain cancer Maternal Uncle    Asthma Paternal Aunt    Prostate cancer Paternal Uncle    Lung cancer Maternal Grandmother    Eczema  Daughter    Allergic rhinitis Daughter    Colon cancer Neg Hx    Colon polyps Neg Hx    Esophageal cancer Neg Hx      Allergies  Allergen Reactions   Clarithromycin Hives   Amoxicillin Palpitations    REACTION: Tacycardia   Meloxicam Rash   Spiriva Respimat [Tiotropium Bromide Monohydrate] Rash   Symbicort  [Budesonide -Formoterol  Fumarate] Rash      Patient's last menstrual period was Patient's last menstrual period was 09/14/2022 (approximate)..             Review of Systems Alls systems reviewed and are negative.     OBGyn Exam    A:         Well Woman GYN exam                             P:        Pap smear collected today Encouraged annual mammogram screening Colon cancer screening up-to-date DXA not indicated Labs and immunizations to do with PMD Discussed breast self exams Encouraged healthy lifestyle practices Encouraged Vit D and Calcium   No follow-ups on file.  Reinaldo Caras

## 2024-01-14 ENCOUNTER — Encounter: Payer: Self-pay | Admitting: Obstetrics and Gynecology

## 2024-01-14 DIAGNOSIS — L819 Disorder of pigmentation, unspecified: Secondary | ICD-10-CM

## 2024-01-14 LAB — HEPATIC FUNCTION PANEL
AG Ratio: 1.4 (calc) (ref 1.0–2.5)
ALT: 18 U/L (ref 6–29)
AST: 15 U/L (ref 10–35)
Albumin: 4.3 g/dL (ref 3.6–5.1)
Alkaline phosphatase (APISO): 92 U/L (ref 37–153)
Bilirubin, Direct: 0.1 mg/dL (ref 0.0–0.2)
Globulin: 3 g/dL (ref 1.9–3.7)
Indirect Bilirubin: 0.2 mg/dL (ref 0.2–1.2)
Total Bilirubin: 0.3 mg/dL (ref 0.2–1.2)
Total Protein: 7.3 g/dL (ref 6.1–8.1)

## 2024-01-14 LAB — ESTRADIOL: Estradiol: 19 pg/mL

## 2024-01-14 LAB — FOLLICLE STIMULATING HORMONE: FSH: 83.7 m[IU]/mL

## 2024-01-14 LAB — VITAMIN D 25 HYDROXY (VIT D DEFICIENCY, FRACTURES): Vit D, 25-Hydroxy: 57 ng/mL (ref 30–100)

## 2024-01-14 LAB — TSH: TSH: 0.84 m[IU]/L

## 2024-01-17 MED ORDER — FUROSEMIDE 40 MG PO TABS: 40.0000 mg | ORAL_TABLET | Freq: Every day | ORAL | 5 refills | Status: AC

## 2024-01-18 ENCOUNTER — Other Ambulatory Visit: Payer: Self-pay | Admitting: Obstetrics and Gynecology

## 2024-01-19 ENCOUNTER — Telehealth: Payer: Self-pay

## 2024-01-19 ENCOUNTER — Ambulatory Visit (INDEPENDENT_AMBULATORY_CARE_PROVIDER_SITE_OTHER): Payer: Self-pay

## 2024-01-19 DIAGNOSIS — J309 Allergic rhinitis, unspecified: Secondary | ICD-10-CM

## 2024-01-19 NOTE — Telephone Encounter (Signed)
 PA submitted for Veozah  45mg  to covermymeds. Patient aware it could take several days for a response

## 2024-01-19 NOTE — Telephone Encounter (Signed)
 Entered in error

## 2024-01-20 ENCOUNTER — Ambulatory Visit: Admitting: Internal Medicine

## 2024-01-20 ENCOUNTER — Encounter: Payer: Self-pay | Admitting: Internal Medicine

## 2024-01-20 ENCOUNTER — Ambulatory Visit: Payer: Self-pay | Admitting: Obstetrics and Gynecology

## 2024-01-20 VITALS — BP 109/69 | HR 69 | Temp 97.3°F | Resp 14 | Ht 61.0 in | Wt 161.0 lb

## 2024-01-20 DIAGNOSIS — R6881 Early satiety: Secondary | ICD-10-CM

## 2024-01-20 DIAGNOSIS — K2289 Other specified disease of esophagus: Secondary | ICD-10-CM

## 2024-01-20 LAB — CYTOLOGY - PAP
Adequacy: ABSENT
Diagnosis: REACTIVE

## 2024-01-20 MED ORDER — SODIUM CHLORIDE 0.9 % IV SOLN: 500.0000 mL | Freq: Once | INTRAVENOUS | Status: DC

## 2024-01-20 NOTE — Patient Instructions (Addendum)
 The endoscopy exam was normal.  I checked the small intestine for gluten allergy  (celiac disease) with biopsies.  I will communicate results.  I appreciate the opportunity to care for you.  Resume Eliquis   (apixaban ) at prior dose tomorrow.   Kenney Peacemaker, MD, FACG  YOU HAD AN ENDOSCOPIC PROCEDURE TODAY AT THE [facility]:   Refer to the procedure report that was given to you for any specific questions about what was found during the examination.  If the procedure report does not answer your questions, please call your gastroenterologist to clarify.  If you requested that your care partner not be given the details of your procedure findings, then the procedure report has been included in a sealed envelope for you to review at your convenience later.  YOU SHOULD EXPECT: Some feelings of bloating in the abdomen. Passage of more gas than usual.  Walking can help get rid of the air that was put into your GI tract during the procedure and reduce the bloating. If you had a lower endoscopy (such as a colonoscopy or flexible sigmoidoscopy) you may notice spotting of blood in your stool or on the toilet paper. If you underwent a bowel prep for your procedure, you may not have a normal bowel movement for a few days.  Please Note:  You might notice some irritation and congestion in your nose or some drainage.  This is from the oxygen used during your procedure.  There is no need for concern and it should clear up in a day or so.  SYMPTOMS TO REPORT IMMEDIATELY:  Following upper endoscopy (EGD)  Vomiting of blood or coffee ground material  New chest pain or pain under the shoulder blades  Painful or persistently difficult swallowing  New shortness of breath  Fever of 100F or higher  Black, tarry-looking stools  For urgent or emergent issues, a gastroenterologist can be reached at any hour by calling [phone]. Do not use MyChart messaging for urgent concerns.    DIET:  We do  recommend a small meal at first, but then you may proceed to your regular diet.  Drink plenty of fluids but you should avoid alcoholic beverages for 24 hours.  ACTIVITY:  You should plan to take it easy for the rest of today and you should NOT DRIVE or use heavy machinery until tomorrow (because of the sedation medicines used during the test).    FOLLOW UP: Our staff will call the number listed on your records the next business day following your procedure.  We will call around 7:15- 8:00 am to check on you and address any questions or concerns that you may have regarding the information given to you following your procedure. If we do not reach you, we will leave a message.     If any biopsies were taken you will be contacted by phone or by letter within the next 1-3 weeks.  Please call us  at [phone] if you have not heard about the biopsies in 3 weeks.    SIGNATURES/CONFIDENTIALITY: You and/or your care partner have signed paperwork which will be entered into your electronic medical record.  These signatures attest to the fact that that the information above on your After Visit Summary has been reviewed and is understood.  Full responsibility of the confidentiality of this discharge information lies with you and/or your care-partner.

## 2024-01-20 NOTE — Telephone Encounter (Signed)
 PA for veozah  was denied. Left patient a message letting her know to call us  back to let us  know if she was going to pay for it out of pocket or if she wanted Dr. Tia Flowers to see if there was something else she could take instead.

## 2024-01-20 NOTE — Progress Notes (Signed)
 History and Physical Interval Note:  01/20/2024 9:43 AM  Mary Pratt  has presented today for endoscopic procedure(s), with the diagnosis of  Encounter Diagnosis  Name Primary?   Early satiety Yes  .  The various methods of evaluation and treatment have been discussed with the patient and/or family. After consideration of risks, benefits and other options for treatment, the patient has consented to  the endoscopic procedure(s).   The patient's history has been reviewed, patient examined, no change in status, stable for endoscopic procedure(s).  I have reviewed the patient's chart and labs.  Questions were answered to the patient's satisfaction.     Kenney Peacemaker, MD, S[facility] Evener

## 2024-01-20 NOTE — Progress Notes (Signed)
 Vitals-DT  Pt's states no medical or surgical changes since previsit or office visit.

## 2024-01-20 NOTE — Op Note (Addendum)
 [facility] Patient Name: Mary Pratt Procedure Date: 01/20/2024 9:47 AM MRN: [id number] Endoscopist: Kenney Peacemaker , MD, [id number] Age: 53 Referring MD:  Date of Birth: Sep 15, 1970 Gender: Female Account #: [id number] Procedure:                Upper GI endoscopy Indications:              Early satiety Medicines:                Monitored Anesthesia Care Procedure:                Pre-Anesthesia Assessment:                           - Prior to the procedure, a History and Physical                            was performed, and patient medications and                            allergies were reviewed. The patient's tolerance of                            previous anesthesia was also reviewed. The risks                            and benefits of the procedure and the sedation                            options and risks were discussed with the patient.                            All questions were answered, and informed consent                            was obtained. Prior Anticoagulants: The patient                            last took Eliquis  (apixaban ) 3 days prior to the                            procedure. ASA Grade Assessment: III - A patient                            with severe systemic disease. After reviewing the                            risks and benefits, the patient was deemed in                            satisfactory condition to undergo the procedure.                           After obtaining informed consent, the endoscope was  passed under direct vision. Throughout the                            procedure, the patient's blood pressure, pulse, and                            oxygen saturations were monitored continuously. The                            GIF [id number] #[id number] was introduced through the                            mouth, and advanced to the second part of duodenum.                            The upper GI endoscopy was  accomplished without                            difficulty. The patient tolerated the procedure                            well. Scope In: Scope Out: Findings:                 The Z-line was irregular and was found at the                            gastroesophageal junction.                           The exam was otherwise without abnormality.                           The cardia and gastric fundus were normal on                            retroflexion.                           Biopsies for histology were taken with a cold                            forceps in the entire duodenum for evaluation of                            celiac disease. Verification of patient                            identification for the specimen was done. Estimated                            blood loss was minimal. Complications:            No immediate complications. Estimated Blood Loss:     Estimated blood loss was minimal. Impression:               -  Z-line irregular, at the gastroesophageal                            junction.                           - The examination was otherwise normal.                           - Biopsies were taken with a cold forceps for                            evaluation of celiac disease. Recommendation:           - Patient has a contact number available for                            emergencies. The signs and symptoms of potential                            delayed complications were discussed with the                            patient. Return to normal activities tomorrow.                            Written discharge instructions were provided to the                            patient.                           - Resume previous diet.                           - Continue present medications.                           - Await pathology results.                           - Resume Eliquis  (apixaban ) at prior dose tomorrow. Kenney Peacemaker, MD 01/20/2024 10:07:28 AM This  report has been signed electronically.

## 2024-01-21 ENCOUNTER — Ambulatory Visit (INDEPENDENT_AMBULATORY_CARE_PROVIDER_SITE_OTHER): Payer: BC Managed Care – PPO

## 2024-01-21 ENCOUNTER — Telehealth: Payer: Self-pay | Admitting: Lactation Services

## 2024-01-21 ENCOUNTER — Ambulatory Visit: Payer: Self-pay | Admitting: Cardiovascular Disease

## 2024-01-21 DIAGNOSIS — I428 Other cardiomyopathies: Secondary | ICD-10-CM

## 2024-01-21 LAB — CUP PACEART REMOTE DEVICE CHECK
Battery Remaining Longevity: 26 mo
Battery Voltage: 2.92 V
Brady Statistic AP VP Percent: 0.73 %
Brady Statistic AP VS Percent: 0.05 %
Brady Statistic AS VP Percent: 99.17 %
Brady Statistic AS VS Percent: 0.05 %
Brady Statistic RA Percent Paced: 0.78 %
Brady Statistic RV Percent Paced: 98.46 %
Date Time Interrogation Session: 20250516003231
HighPow Impedance: 67 Ohm
Implantable Lead Connection Status: 753985
Implantable Lead Connection Status: 753985
Implantable Lead Connection Status: 753985
Implantable Lead Implant Date: 20170206
Implantable Lead Implant Date: 20170206
Implantable Lead Implant Date: 20170206
Implantable Lead Location: 753858
Implantable Lead Location: 753859
Implantable Lead Location: 753860
Implantable Lead Model: 4398
Implantable Lead Model: 5076
Implantable Pulse Generator Implant Date: 20220520
Lead Channel Impedance Value: 1064 Ohm
Lead Channel Impedance Value: 1140 Ohm
Lead Channel Impedance Value: 1235 Ohm
Lead Channel Impedance Value: 1406 Ohm
Lead Channel Impedance Value: 1425 Ohm
Lead Channel Impedance Value: 292.308
Lead Channel Impedance Value: 292.308
Lead Channel Impedance Value: 302.899
Lead Channel Impedance Value: 398.095
Lead Channel Impedance Value: 398.095
Lead Channel Impedance Value: 418 Ohm
Lead Channel Impedance Value: 475 Ohm
Lead Channel Impedance Value: 551 Ohm
Lead Channel Impedance Value: 646 Ohm
Lead Channel Impedance Value: 760 Ohm
Lead Channel Impedance Value: 760 Ohm
Lead Channel Impedance Value: 836 Ohm
Lead Channel Impedance Value: 988 Ohm
Lead Channel Pacing Threshold Amplitude: 0.5 V
Lead Channel Pacing Threshold Amplitude: 0.5 V
Lead Channel Pacing Threshold Amplitude: 3.25 V
Lead Channel Pacing Threshold Pulse Width: 0.4 ms
Lead Channel Pacing Threshold Pulse Width: 0.4 ms
Lead Channel Pacing Threshold Pulse Width: 1 ms
Lead Channel Sensing Intrinsic Amplitude: 0.625 mV
Lead Channel Sensing Intrinsic Amplitude: 0.625 mV
Lead Channel Sensing Intrinsic Amplitude: 9.25 mV
Lead Channel Sensing Intrinsic Amplitude: 9.25 mV
Lead Channel Setting Pacing Amplitude: 1.5 V
Lead Channel Setting Pacing Amplitude: 2 V
Lead Channel Setting Pacing Amplitude: 3.75 V
Lead Channel Setting Pacing Pulse Width: 0.4 ms
Lead Channel Setting Pacing Pulse Width: 1 ms
Lead Channel Setting Sensing Sensitivity: 0.3 mV
Zone Setting Status: 755011

## 2024-01-21 NOTE — Telephone Encounter (Signed)
  Follow up Call-     01/20/2024    8:53 AM 05/22/2021    7:40 AM  Call back number  Post procedure Call Back phone  # [phone]  Permission to leave phone message  Yes     Patient questions:  Do you have a fever, pain , or abdominal swelling? No. Pain Score  0 *  Have you tolerated food without any problems? Yes.    Have you been able to return to your normal activities? Yes.    Do you have any questions about your discharge instructions: Diet   No. Medications  No. Follow up visit  No.  Do you have questions or concerns about your Care? No.  Actions: * If pain score is 4 or above: No action needed, pain <4.

## 2024-01-21 NOTE — Telephone Encounter (Signed)
 Please respond to patient regarding pap result

## 2024-01-23 ENCOUNTER — Other Ambulatory Visit: Payer: Self-pay | Admitting: Cardiology

## 2024-01-23 DIAGNOSIS — I5022 Chronic systolic (congestive) heart failure: Secondary | ICD-10-CM

## 2024-01-24 LAB — SURGICAL PATHOLOGY

## 2024-01-25 NOTE — Telephone Encounter (Signed)
 FYI. No documented return call to date.  Received paper copy of denial today stating denial due to "not seeing certain details about the pt's use and treatment. Insurance may consider approval of this drug after a trial of certain other drugs first (two or more failed trials of preferred formulary alternatives) including: menest oral, estradiol  oral, estradiol  patch, or evamist  spray."

## 2024-01-26 ENCOUNTER — Telehealth: Payer: Self-pay | Admitting: Internal Medicine

## 2024-01-26 ENCOUNTER — Ambulatory Visit (INDEPENDENT_AMBULATORY_CARE_PROVIDER_SITE_OTHER)

## 2024-01-26 ENCOUNTER — Ambulatory Visit: Payer: Self-pay | Admitting: Internal Medicine

## 2024-01-26 DIAGNOSIS — J309 Allergic rhinitis, unspecified: Secondary | ICD-10-CM

## 2024-01-26 NOTE — Telephone Encounter (Signed)
 Patient is requesting a call to discuss recent endoscopy results. Please advise, thank you

## 2024-01-26 NOTE — Telephone Encounter (Signed)
 Called and spoke with patient. Advised her that Dr. Willy Harvest has not reviewed endoscopy results at this time. Once he has reviewed them, we will give her a call for f/u. Patient verbalized understanding.

## 2024-01-27 ENCOUNTER — Ambulatory Visit (INDEPENDENT_AMBULATORY_CARE_PROVIDER_SITE_OTHER)

## 2024-01-27 ENCOUNTER — Ambulatory Visit (INDEPENDENT_AMBULATORY_CARE_PROVIDER_SITE_OTHER): Admitting: Obstetrics and Gynecology

## 2024-01-27 ENCOUNTER — Encounter: Payer: Self-pay | Admitting: Obstetrics and Gynecology

## 2024-01-27 ENCOUNTER — Other Ambulatory Visit (HOSPITAL_COMMUNITY)
Admission: RE | Admit: 2024-01-27 | Discharge: 2024-01-27 | Disposition: A | Discharge status: Home / Self Care | Source: Ambulatory Visit | Attending: Obstetrics and Gynecology | Admitting: Obstetrics and Gynecology

## 2024-01-27 VITALS — BP 110/70 | HR 67

## 2024-01-27 DIAGNOSIS — Z712 Person consulting for explanation of examination or test findings: Secondary | ICD-10-CM

## 2024-01-27 DIAGNOSIS — E2839 Other primary ovarian failure: Secondary | ICD-10-CM | POA: Diagnosis not present

## 2024-01-27 DIAGNOSIS — L819 Disorder of pigmentation, unspecified: Secondary | ICD-10-CM

## 2024-01-27 DIAGNOSIS — R140 Abdominal distension (gaseous): Secondary | ICD-10-CM | POA: Diagnosis not present

## 2024-01-27 NOTE — Progress Notes (Signed)
   Acute Office Visit  Subjective:    Patient ID: Mary Pratt, adult    DOB: [birth year], 53 y.o.   MRN: [id number]   HPI 53 y.o. presents today for ultrasound & vulvar biopsy (Ultrasound & vulvar biopsy//jj) .     H/o DVT on blood thinner Having hot flashes, bloating, insomnia, fatigue, doesn't feel like herself Discussed either veozah  and or intrarosa. Information provided on both. She would like to run her hormone panel. Samples of 2 boxes of veozah  given. Lft's checked today. Denies any history of liver conditions besides a history of fatty liver. To repeat lft's in 3 months. To get [facility] today. 01/13/24 pap collected 2022 colonoscopy To get pUS to evaluate bloating  Left labial hypopigmentation: 01/27/24 punch biopsy collected. Reports some pruritus at the area  Patient's last menstrual period was 09/14/2022 (approximate).    PUS 6.64cm Normal ovaries 3.30mm endometrial lining and is normal .92cm fibroid Atrophic ovaries   Review of Systems     Objective:     OBGyn Exam  BP 110/70   Pulse 67   LMP 09/14/2022 (Approximate)   SpO2 99%  Wt Readings from Last 3 Encounters:  01/20/24 161 lb (73 kg)  01/13/24 165 lb (74.8 kg)  01/07/24 165 lb (74.8 kg)      Joy was present for the entire procedure  Procedure: Punch biopsy Time out performed Patient consented for the procedure with written consent.  The area was cleaned with betadine.  1 cc of lidocaine  with epi was injected subcutaneously.  A 3 mm punch biopsy was used and the biopsy was then lifted and cut with the scissors and sent to pathology.  Hemostasis was achieved with silver nitrate.  Patient tolerated the procedure well.  Post care instructions were discussed with patient.     Assessment & Plan:  PUS and punch biopsy Post care instructions and concerning s/s were discussed with the patient.  Encouraged patient to return with any concerns PUS results normal and reviewed with patient  5 minutes spent  on reviewing records, imaging,  and one on one patient time and counseling patient and documentation Dr. Caro Christmas

## 2024-01-27 NOTE — Telephone Encounter (Signed)
 Spoke with patient. Advised her that on 01/13/2024 she had a hepatic function panel drawn and everything came back as normal. Answered all questions. Patient verbalized understanding.

## 2024-01-27 NOTE — Telephone Encounter (Signed)
 Pt seen in office today. Encounter closed.

## 2024-01-27 NOTE — Telephone Encounter (Signed)
 Inbound call from patient requesting a call to discuss possibly having liver test done. Please advise, thank you

## 2024-01-28 ENCOUNTER — Ambulatory Visit (INDEPENDENT_AMBULATORY_CARE_PROVIDER_SITE_OTHER)

## 2024-01-28 DIAGNOSIS — J309 Allergic rhinitis, unspecified: Secondary | ICD-10-CM | POA: Diagnosis not present

## 2024-01-30 ENCOUNTER — Other Ambulatory Visit: Payer: Self-pay

## 2024-01-30 ENCOUNTER — Encounter (HOSPITAL_COMMUNITY): Payer: Self-pay | Admitting: *Deleted

## 2024-01-30 ENCOUNTER — Emergency Department (HOSPITAL_COMMUNITY)

## 2024-01-30 ENCOUNTER — Emergency Department (HOSPITAL_COMMUNITY)
Admission: EM | Admit: 2024-01-30 | Discharge: 2024-01-30 | Disposition: A | Discharge status: Home / Self Care | Attending: Emergency Medicine | Admitting: Emergency Medicine

## 2024-01-30 DIAGNOSIS — Z7901 Long term (current) use of anticoagulants: Secondary | ICD-10-CM | POA: Diagnosis not present

## 2024-01-30 DIAGNOSIS — M545 Low back pain, unspecified: Secondary | ICD-10-CM | POA: Diagnosis present

## 2024-01-30 DIAGNOSIS — R109 Unspecified abdominal pain: Secondary | ICD-10-CM | POA: Diagnosis not present

## 2024-01-30 DIAGNOSIS — R110 Nausea: Secondary | ICD-10-CM | POA: Diagnosis not present

## 2024-01-30 LAB — COMPREHENSIVE METABOLIC PANEL WITH GFR
ALT: 26 U/L (ref 0–44)
AST: 23 U/L (ref 15–41)
Albumin: 3.7 g/dL (ref 3.5–5.0)
Alkaline Phosphatase: 82 U/L (ref 38–126)
Anion gap: 12 (ref 5–15)
BUN: 15 mg/dL (ref 6–20)
CO2: 25 mmol/L (ref 22–32)
Calcium: 8.9 mg/dL (ref 8.9–10.3)
Chloride: 100 mmol/L (ref 98–111)
Creatinine, Ser: 0.82 mg/dL (ref 0.44–1.00)
GFR, Estimated: >60 mL/min (ref >60)
Glucose, Bld: 104 mg/dL — ABNORMAL HIGH (ref 70–99)
Potassium: 3.4 mmol/L — ABNORMAL LOW (ref 3.5–5.1)
Sodium: 137 mmol/L (ref 135–145)
Total Bilirubin: 0.9 mg/dL (ref 0.0–1.2)
Total Protein: 7.4 g/dL (ref 6.5–8.1)

## 2024-01-30 LAB — URINALYSIS, ROUTINE W REFLEX MICROSCOPIC
Bilirubin Urine: NEGATIVE
Glucose, UA: NEGATIVE mg/dL
Hgb urine dipstick: NEGATIVE
Ketones, ur: NEGATIVE mg/dL
Leukocytes,Ua: NEGATIVE
Nitrite: NEGATIVE
Protein, ur: NEGATIVE mg/dL
Specific Gravity, Urine: 1.021 (ref 1.005–1.030)
pH: 7 (ref 5.0–8.0)

## 2024-01-30 LAB — CBC WITH DIFFERENTIAL/PLATELET
Abs Immature Granulocytes: 0.01 10*3/uL (ref 0.00–0.07)
Basophils Absolute: 0 10*3/uL (ref 0.0–0.1)
Basophils Relative: 1 %
Eosinophils Absolute: 0.2 10*3/uL (ref 0.0–0.5)
Eosinophils Relative: 4 %
HCT: 35.3 % — ABNORMAL LOW (ref 36.0–46.0)
Hemoglobin: 11.7 g/dL — ABNORMAL LOW (ref 12.0–15.0)
Immature Granulocytes: 0 %
Lymphocytes Relative: 34 %
Lymphs Abs: 1.6 10*3/uL (ref 0.7–4.0)
MCH: 27.2 pg (ref 26.0–34.0)
MCHC: 33.1 g/dL (ref 30.0–36.0)
MCV: 82.1 fL (ref 80.0–100.0)
Monocytes Absolute: 0.5 10*3/uL (ref 0.1–1.0)
Monocytes Relative: 9 %
Neutro Abs: 2.5 10*3/uL (ref 1.7–7.7)
Neutrophils Relative %: 52 %
Platelets: 202 10*3/uL (ref 150–400)
RBC: 4.3 MIL/uL (ref 3.87–5.11)
RDW: 15 % (ref 11.5–15.5)
WBC: 4.8 10*3/uL (ref 4.0–10.5)
nRBC: 0 % (ref 0.0–0.2)

## 2024-01-30 LAB — LIPASE, BLOOD: Lipase: 26 U/L (ref 11–51)

## 2024-01-30 MED ORDER — LACTATED RINGERS IV BOLUS
1000.0000 mL | Freq: Once | INTRAVENOUS | Status: AC
  Administered 2024-01-30: 1000 mL via INTRAVENOUS

## 2024-01-30 MED ORDER — ONDANSETRON HCL 4 MG/2ML IJ SOLN
4.0000 mg | Freq: Once | INTRAMUSCULAR | Status: AC
  Administered 2024-01-30: 4 mg via INTRAVENOUS
  Filled 2024-01-30: qty 2

## 2024-01-30 MED ORDER — KETOROLAC TROMETHAMINE 15 MG/ML IJ SOLN
15.0000 mg | Freq: Once | INTRAMUSCULAR | Status: AC
  Administered 2024-01-30: 15 mg via INTRAVENOUS
  Filled 2024-01-30: qty 1

## 2024-01-30 MED ORDER — IOHEXOL 350 MG/ML SOLN
80.0000 mL | Freq: Once | INTRAVENOUS | Status: AC | PRN
  Administered 2024-01-30: 80 mL via INTRAVENOUS

## 2024-01-30 MED ORDER — DICYCLOMINE HCL 20 MG PO TABS: 20.0000 mg | ORAL_TABLET | Freq: Two times a day (BID) | ORAL | 0 refills | Status: DC

## 2024-01-30 MED ORDER — DOCUSATE SODIUM 100 MG PO CAPS: 100.0000 mg | ORAL_CAPSULE | Freq: Two times a day (BID) | ORAL | 0 refills | Status: DC

## 2024-01-30 NOTE — ED Triage Notes (Signed)
 Pt with left lower back pain that radiates to abd pain on Monday. + nausea denies pain with urination. + itching  On blood thinners for DVT per pt.

## 2024-01-30 NOTE — ED Notes (Signed)
 Patient transported to CT

## 2024-01-30 NOTE — ED Provider Notes (Signed)
 [facility] AT [facility] Provider Note   CSN: [id number] Arrival date & time: 01/30/24  1434     History  Chief Complaint  Patient presents with   Back Pain    Mary Pratt is a 53 y.o. adult.  Patient is a 53 year old female who presents emergency department the chief complaint of lower back pain, nausea, constipation which has been ongoing for approximate the past week.  Patient does admit to a recent upper endoscopy and has been having symptoms since that time.  Patient denies any associated vomiting, diarrhea, dysuria or hematuria.  Mary Pratt has had no abnormal vaginal discharge or bleeding.  Mary Pratt denies any associated dizziness, lightheadedness or syncope.  Mary Pratt has had no associated fever or chills.  Mary Pratt denies any urinary bowel incontinence, saddle paresthesias or gait changes.   Back Pain Associated symptoms: abdominal pain        Home Medications Prior to Admission medications   Medication Sig Start Date End Date Taking? Authorizing Provider  albuterol  (VENTOLIN  HFA) 108 (90 Base) MCG/ACT inhaler Inhale 1-2 puffs into the lungs every 4 (four) hours as needed. 01/07/24   Ardie Kras, FNP  apixaban  (ELIQUIS ) 5 MG TABS tablet Take 1 tablet (5 mg total) by mouth 2 (two) times daily. Start taking after completion of starter pack. 11/01/23   Faye Hoops B, RPH-CPP  Azelastine  HCl 137 MCG/SPRAY SOLN Place 2 sprays into both nostrils 2 (two) times daily. 11/16/23   [provider]  BREO ELLIPTA 200-25 MCG/ACT AEPB Inhale 1 puff into the lungs daily. 08/29/23   [provider]  carvedilol  (COREG ) 25 MG tablet TAKE 1 TABLET (25 MG TOTAL) BY MOUTH TWICE A DAY WITH MEALS 11/30/23   Laurann Pollock, MD  clobetasol ointment (TEMOVATE) 0.05 % Apply 1 Application topically 2 (two) times daily. 10/26/23   [provider]  Cyanocobalamin  (VITAMIN B-12 IJ) Inject as directed every 30 (thirty) days.    [provider]   EPINEPHRINE  0.3 mg/0.3 mL IJ SOAJ injection INJECT 0.3 MG INTO THE MUSCLE AS NEEDED FOR ANAPHYLAXIS. Patient not taking: Reported on 01/27/2024 01/12/24   Ardie Kras, FNP  fexofenadine (ALLEGRA) 180 MG tablet Take 180 mg by mouth daily. 01/13/24   [provider]  Fezolinetant  (VEOZAH ) 45 MG TABS Take 1 tablet (45 mg total) by mouth daily. Patient not taking: Reported on 01/27/2024 01/13/24   Reinaldo Caras, MD  fluocinonide ointment (LIDEX) 0.05 % Apply 1 Application topically daily as needed (eczema).    [provider]  fluticasone  (FLONASE ) 50 MCG/ACT nasal spray Place 2 sprays into both nostrils every morning. 01/07/24   Ardie Kras, FNP  fluticasone -salmeterol (ADVAIR DISKUS) 250-50 MCG/ACT AEPB Inhale 1 puff into the lungs in the morning. 01/07/24   Ardie Kras, FNP  furosemide  (LASIX ) 40 MG tablet Take 1 tablet (40 mg total) by mouth daily. 01/17/24   Lasalle Pointer, NP  Galcanezumab -gnlm (EMGALITY ) 120 MG/ML SOAJ INJECT 240 MG INTO THE SKIN ONCE FOR 1 DOSE. LOADING DOSE Patient taking differently: Inject 240 mg into the skin every 30 (thirty) days. 07/01/23   Merriam Abbey, DO  nitroGLYCERIN  (NITROSTAT ) 0.4 MG SL tablet Place 1 tablet (0.4 mg total) under the tongue every 5 (five) minutes x 3 doses as needed for chest pain. Patient not taking: Reported on 01/13/2024 11/09/22   Verona Goodwill, MD  pantoprazole  (PROTONIX ) 40 MG tablet Take 40 mg by mouth daily.    [provider]  sacubitril -valsartan  (ENTRESTO ) 24-26 MG TAKE 1 TABLET BY MOUTH TWICE A DAY 01/13/24   Laurann Pollock, MD  sertraline  (ZOLOFT ) 100 MG tablet Take 100 mg by mouth daily. 01/24/20   [provider]  spironolactone  (ALDACTONE ) 25 MG tablet TAKE 1 TABLET (25 MG TOTAL) BY MOUTH DAILY. 01/24/24   Laurann Pollock, MD  Vitamin D , Ergocalciferol , (DRISDOL) 1.25 MG (50000 UNIT) CAPS capsule Take 50,000 Units by mouth once a week. 10/26/23   [provider]      Allergies     Clarithromycin, Amoxicillin, Meloxicam, Spiriva respimat [tiotropium bromide monohydrate], and Symbicort  [budesonide -formoterol  fumarate]    Review of Systems   Review of Systems  Gastrointestinal:  Positive for abdominal pain.  Musculoskeletal:  Positive for back pain.  All other systems reviewed and are negative.   Physical Exam Updated Vital Signs BP 115/80 (BP Location: Right Arm)   Pulse 72   Temp 98.4 F (36.9 C) (Oral)   Resp 18   Ht 5\' 1"  (1.549 m)   Wt 71.2 kg   LMP 09/14/2022 (Approximate)   SpO2 97%   BMI 29.66 kg/m  Physical Exam Vitals and nursing note reviewed.  Constitutional:      Appearance: Normal appearance.  HENT:     Head: Normocephalic and atraumatic.     Nose: Nose normal.     Mouth/Throat:     Mouth: Mucous membranes are moist.  Eyes:     Extraocular Movements: Extraocular movements intact.     Conjunctiva/sclera: Conjunctivae normal.     Pupils: Pupils are equal, round, and reactive to light.  Cardiovascular:     Rate and Rhythm: Normal rate and regular rhythm.     Pulses: Normal pulses.     Heart sounds: Normal heart sounds. No murmur heard.    No gallop.  Pulmonary:     Effort: Pulmonary effort is normal. No respiratory distress.     Breath sounds: Normal breath sounds. No stridor. No wheezing, rhonchi or rales.  Abdominal:     General: Abdomen is flat. Bowel sounds are normal. There is no distension.     Palpations: Abdomen is soft.     Tenderness: There is no abdominal tenderness. There is no guarding.     Comments: Umbilical hernia noted that is easily reducible  Musculoskeletal:        General: No swelling, tenderness, deformity or signs of injury. Normal range of motion.     Cervical back: Normal range of motion and neck supple.  Skin:    General: Skin is warm and dry.  Neurological:     General: No focal deficit present.     Mental Status: Mary Pratt is alert and oriented to person, place, and time. Mental status is at baseline.      Cranial Nerves: No cranial nerve deficit.     Sensory: No sensory deficit.     Motor: No weakness.     Coordination: Coordination normal.     Gait: Gait normal.  Psychiatric:        Mood and Affect: Mood normal.        Behavior: Behavior normal.        Thought Content: Thought content normal.        Judgment: Judgment normal.     ED Results / Procedures / Treatments   Labs (all labs ordered are listed, but only abnormal results are displayed) Labs Reviewed  COMPREHENSIVE METABOLIC PANEL WITH GFR - Abnormal; Notable for the following components:  Result Value   Potassium 3.4 (*)    Glucose, Bld 104 (*)    All other components within normal limits  CBC WITH DIFFERENTIAL/PLATELET - Abnormal; Notable for the following components:   Hemoglobin 11.7 (*)    HCT 35.3 (*)    All other components within normal limits  URINALYSIS, ROUTINE W REFLEX MICROSCOPIC - Abnormal; Notable for the following components:   Color, Urine STRAW (*)    All other components within normal limits  LIPASE, BLOOD    EKG None  Radiology CT ABDOMEN PELVIS W CONTRAST Result Date: 01/30/2024 CLINICAL DATA:  Left lower quadrant pain EXAM: CT ABDOMEN AND PELVIS WITH CONTRAST TECHNIQUE: Multidetector CT imaging of the abdomen and pelvis was performed using the standard protocol following bolus administration of intravenous contrast. RADIATION DOSE REDUCTION: This exam was performed according to the departmental dose-optimization program which includes automated exposure control, adjustment of the mA and/or kV according to patient size and/or use of iterative reconstruction technique. CONTRAST:  80mL OMNIPAQUE  IOHEXOL  350 MG/ML SOLN COMPARISON:  10/06/2022 FINDINGS: Lower chest: No pleural or pericardial effusion. Transvenous pacing leads partially visualized. Visualized lung bases clear. Hepatobiliary: Stable scattered small hepatic cysts. No new liver lesion or biliary ductal dilatation. Gallbladder nondilated. No  calcified gallstones. Pancreas: Unremarkable. No pancreatic ductal dilatation or surrounding inflammatory changes. Spleen: Normal in size without focal abnormality. Adrenals/Urinary Tract: No adrenal mass. Stable right upper pole renal cyst. Symmetric renal enhancement without urolithiasis or hydronephrosis. Urinary bladder incompletely distended. Stomach/Bowel: Stomach is nondistended, without acute finding. Small bowel decompressed. Normal appendix. The colon is partially distended, without acute finding. Vascular/Lymphatic: No significant vascular findings are present. No enlarged abdominal or pelvic lymph nodes. Reproductive: Normal uterus. Tubal ligation clips. No adnexal mass. Other: No ascites.  No free air. Musculoskeletal: Small paraumbilical hernia containing only mesenteric fat. 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 IMPRESSION: 1. No acute findings. 2. Small paraumbilical hernia containing only mesenteric fat. Electronically Signed   By: Nicoletta Barrier M.D.   On: 01/30/2024 16:20    Procedures Procedures    Medications Ordered in ED Medications  ketorolac  (TORADOL ) 15 MG/ML injection 15 mg (15 mg Intravenous Given 01/30/24 1543)  ondansetron  (ZOFRAN ) injection 4 mg (4 mg Intravenous Given 01/30/24 1543)  lactated ringers bolus 1,000 mL (1,000 mLs Intravenous Bolus 01/30/24 1546)  iohexol  (OMNIPAQUE ) 350 MG/ML injection 80 mL (80 mLs Intravenous Contrast Given 01/30/24 1607)    ED Course/ Medical Decision Making/ A&P                                 Medical Decision Making Amount and/or Complexity of Data Reviewed Labs: ordered. Radiology: ordered.  Risk OTC drugs. Prescription drug management.   This patient presents to the ED for concern of abdominal pain, low back pain differential diagnosis includes acute appendicitis, cholecystitis, bowel torsion, diverticulitis, ovarian torsion or cyst, PID, tumor and abscess, pyelonephritis, kidney stone, pancreatitis, mesenteric  ischemia, hernia, bowel spasm    Additional history obtained:  Additional history obtained from none External records from outside source obtained and reviewed including none   Lab Tests:  I Ordered, and personally interpreted labs.  The pertinent results include: No leukocytosis, mild anemia, normal electrolytes, normal kidney function liver function, unremarkable urinalysis, negative lipase   Imaging Studies ordered:  I ordered imaging studies including CT scan of abdomen and pelvis I independently visualized and interpreted imaging which showed no acute surgical process,  fat-containing umbilical hernia I agree with the radiologist interpretation   Medicines ordered and prescription drug management:  I ordered medication including Bentyl , Colace for bowel spasm Reevaluation of the patient after these medicines showed that the patient improved I have reviewed the patients home medicines and have made adjustments as needed   Problem List / ED Course:  Patient is doing well at this time and is stable for discharge home.  Discussed with patient that we will treat her for possible bowel spasms at this time.  Mary Pratt does have some mild dilation of her colon noted on CT scan of the abdomen and pelvis with no other signs of acute surgical process.  Mary Pratt has no indication for acute appendicitis, cholecystitis, bowel torsion, diverticulitis, ovarian torsion or cyst, PID, tumor and abscess, pyelonephritis, kidney stone, pancreatitis, mesenteric ischemia.  Will recommend continued close follow-up with her primary care doctor and her gastroenterologist on an outpatient basis.  Do not suspect any further emergent workup is warranted at this time.  Patient has no indication for urinary tract infection.  Strict turn precautions were provided for any new or worsening symptoms.  Patient voiced understanding and had no additional questions.   Social Determinants of Health:  None            Final Clinical Impression(s) / ED Diagnoses Final diagnoses:  None    Rx / DC Orders ED Discharge Orders     None         Emmalene Hare 01/30/24 1728    Cheyenne Cotta, MD 02/02/24 [phone]

## 2024-01-30 NOTE — Discharge Instructions (Signed)
 Please follow-up closely with your primary care doctor and gastroenterology on an outpatient basis.  You may need to consider a colonoscopy as well.  Return to the emergency department immediately for any new or worsening symptoms.

## 2024-01-31 ENCOUNTER — Encounter: Payer: Self-pay | Admitting: Internal Medicine

## 2024-01-31 NOTE — Addendum Note (Signed)
 Addended by: Reinaldo Caras on: 01/31/2024 04:19 PM   Modules accepted: Level of Service

## 2024-02-02 ENCOUNTER — Telehealth: Payer: Self-pay | Admitting: Internal Medicine

## 2024-02-02 NOTE — Telephone Encounter (Signed)
 Returned call to patient. Was able to offer 02/08/2024 at 2:30 PM. Patient agreed to appointment time.

## 2024-02-02 NOTE — Telephone Encounter (Signed)
 Patient called and stated that she was seen in the ED with a partial blockage and was hope to get a sooner appointment then what she has schedule for the 12 th of June. Patient is requesting a call back. Please advise.

## 2024-02-04 ENCOUNTER — Ambulatory Visit: Payer: Self-pay | Admitting: Obstetrics and Gynecology

## 2024-02-04 LAB — SURGICAL PATHOLOGY

## 2024-02-07 ENCOUNTER — Encounter: Payer: Self-pay | Admitting: Genetic Counselor

## 2024-02-07 ENCOUNTER — Inpatient Hospital Stay: Attending: Oncology | Admitting: Genetic Counselor

## 2024-02-07 ENCOUNTER — Inpatient Hospital Stay

## 2024-02-07 DIAGNOSIS — Z8042 Family history of malignant neoplasm of prostate: Secondary | ICD-10-CM

## 2024-02-07 DIAGNOSIS — Z1379 Encounter for other screening for genetic and chromosomal anomalies: Secondary | ICD-10-CM | POA: Diagnosis present

## 2024-02-07 DIAGNOSIS — Z803 Family history of malignant neoplasm of breast: Secondary | ICD-10-CM

## 2024-02-07 DIAGNOSIS — Z808 Family history of malignant neoplasm of other organs or systems: Secondary | ICD-10-CM | POA: Diagnosis not present

## 2024-02-07 LAB — GENETIC SCREENING ORDER

## 2024-02-07 NOTE — Progress Notes (Signed)
 Geneieve,   I have you scheduled for an office visit with Dr. Tia Flowers on 02/16/24 at 2:00 PM, arriving at 1:45 PM.   Have a great rest of your day!  Aleda Hurl, RN

## 2024-02-07 NOTE — Progress Notes (Signed)
 REFERRING PROVIDER: Sonja Ennis, MD [address] [place],  [place] [address]  PRIMARY PROVIDER:  Leesa Pulling, MD  PRIMARY REASON FOR VISIT:  1. Family history of malignant neoplasm of breast   2. Family history of malignant neoplasm of prostate   3. Family history of brain cancer     HISTORY OF PRESENT ILLNESS:   Mary Pratt, a 53 y.o. adult, was seen for a [facility] cancer genetics consultation at the request of Dr. [name] due to a family history of cancer.  Mary Pratt presents to clinic today to discuss the possibility of a hereditary predisposition to cancer, genetic testing, and to further clarify her future cancer risks, as well as potential cancer risks for family members.   Mary Pratt is a 53 y.o. adult with no personal history of cancer.  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 She has sickle cell trait. She had negative genetic testing through the [place] population screening study.   RISK FACTORS:  Menarche was at age 55-13.  First live birth at age 89.  OCP use for approximately 3 years.  Ovaries intact: yes.  Hysterectomy: no.  Menopausal status: postmenopausal.  HRT use: 0 years. Colonoscopy: yes; normal. Mammogram within the last year: yes. Density category b.  Number of breast biopsies: 0.   Past Medical History:  Diagnosis Date   Abnormal pap 05/2012   ASCUS + HPV, 11/14 LGSIL   Anemia    Anxiety    Asthma    Cardiac defibrillator in place    Chronic systolic CHF (congestive heart failure) (HCC)    a. 02/2015 Echo: EF 20%, sev dil w/ diff HK, worse [facility]. Tiv AI, mild MR, mod dil LA, PASP .   DVT of lower extremity (deep venous thrombosis) (HCC)    Functional dyspepsia    early satiety    GERD with stricture    dilated 2010   HELICOBACTER PYLORI GASTRITIS 01/15/2009   dyspnea, ? reaction to Biaxin/amoxicillin  Pylera 01/15/09 - completed therapy      IBS (irritable bowel  syndrome)    NICM (nonischemic cardiomyopathy) (HCC)    a. 02/2015 Cath: nl cors, EF 15%, mild PAH;  b. 02/2015 Enrolled in VEST trial.   Sickle cell trait (HCC)    Situational depression    "son died"   Vitamin B 12 deficiency 11/2022    Past Surgical History:  Procedure Laterality Date   BIV ICD GENERATOR CHANGEOUT N/A 01/24/2021   Procedure: BIV ICD GENERATOR CHANGEOUT;  Surgeon: Verona Goodwill, MD;  Location: [facility] INVASIVE CV LAB;  Service: Cardiovascular;  Laterality: N/A;   CARDIAC CATHETERIZATION N/A 02/26/2015   Procedure: Right/Left Heart Cath and Coronary Angiography;  Surgeon: Millicent Ally, MD;  Location: [facility] INVASIVE CV LAB;  Service: Cardiovascular;  Laterality: N/A;   COLPOSCOPY  09/08/2011   neg   EP IMPLANTABLE DEVICE N/A 10/14/2015   Procedure: BiV ICD Insertion CRT-D;  Surgeon: Verona Goodwill, MD;  Location: [facility] INVASIVE CV LAB;  Service: Cardiovascular;  Laterality: N/A;   ESOPHAGOGASTRODUODENOSCOPY (EGD) WITH ESOPHAGEAL DILATION  11/30/2008   esophageal ring dilated 54 French, H. pylori gastritis   HARVEST BONE GRAFT  02/2023   TUBAL LIGATION  09/07/1989   WISDOM TOOTH EXTRACTION     WRIST SURGERY  2023    Social History   Socioeconomic History   Marital status: Single    Spouse name: Not on  file   Number of children: 3   Years of education: Not on file   Highest education level: Not on file  Occupational History   Occupation: texturing operator    Employer: [employer]  Tobacco Use   Smoking status: Never    Passive exposure: Never   Smokeless tobacco: Never  Vaping Use   Vaping status: Never Used  Substance and Sexual Activity   Alcohol use: Yes    Comment: occasional wine   Drug use: No   Sexual activity: Yes    Partners: Male    Birth control/protection: Surgical, Post-menopausal    Comment: BTL  Other Topics Concern   Not on file  Social History Narrative   Single, 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   2 sons 1 daughter  1 son deceased   Drinks caffeine  up to a few a day, never smoker no drug use   Occ wine   Social Drivers of Corporate investment banker Strain: Not on file  Food Insecurity: No Food Insecurity (10/15/2023)   Hunger Vital Sign    Worried About Running Out of Food in the Last Year: Never true    Ran Out of Food in the Last Year: Never true  Transportation Needs: No Transportation Needs (10/15/2023)   PRAPARE - Administrator, Civil Service (Medical): No    Lack of Transportation (Non-Medical): No  Physical Activity: Not on file  Stress: Not on file  Social Connections: Unknown (01/20/2022)   Received from [facility]   Social Network    Social Network: Not on file     FAMILY HISTORY:  We obtained a detailed, 4-generation family history.  Significant diagnoses are listed below: Family History  Problem Relation Age of Onset   Diabetes Mother    Hypertension Mother    Hypothyroidism Mother    Prostate cancer Father 39   Breast cancer Maternal Aunt 84   Breast cancer Maternal Aunt 15   Breast cancer Maternal Aunt 12   Brain cancer Maternal Uncle 79   Brain cancer Maternal Uncle 32   Asthma Paternal Aunt    Prostate cancer Paternal Uncle 74   Prostate cancer Paternal Uncle 18   Prostate cancer Paternal Uncle 82   Lung cancer Maternal Grandmother 20   Eczema Daughter    Allergic rhinitis Daughter    Colon cancer Neg Hx    Colon polyps Neg Hx    Esophageal cancer Neg Hx    Mary Pratt is unaware of previous family history of genetic testing for hereditary cancer risks. Patient's maternal ancestors are of African American descent, and paternal ancestors are of African American descent. There is no reported Ashkenazi Jewish ancestry.   Mary Pratt reports that her father was diagnosed with prostate cancer at age 76, living at 64. She reports three paternal uncles diagnosed with prostate cancer at ages 84, 39, 43, deceased. She reports two maternal uncles  diagnosed with brain cancer at age 37 and 32, deceased. She reports three paternal aunts diagnosed with breast cancer, one at 59 and is living at 74, one at 73 and is living at 5, and one at 48 that passed at 65. She reports her maternal grandmother was diagnosed with lung cancer at age 28, passed away at 78.     GENETIC COUNSELING ASSESSMENT: Ms. Debruyn is a 53 y.o. adult with a family history of cancer which is suggestive on an inherited predisposition to cancer given the  maternal family history of three second degree relatives with breast cancer and the paternal history of her father and three second degree relatives with prostate cancer. We, therefore, discussed and recommended the following at today's visit.   DISCUSSION: We discussed that, in general, most cancer is not inherited in families, but instead is sporadic or familial. Sporadic cancers occur by chance and typically happen at older ages (>50 years) as this type of cancer is caused by genetic changes acquired during an individual's lifetime. Some families have more cancers than would be expected by chance; however, the ages or types of cancer are not consistent with a known genetic mutation or known genetic mutations have been ruled out. This type of familial cancer is thought to be due to a combination of multiple genetic, environmental, hormonal, and lifestyle factors. While this combination of factors likely increases the risk of cancer, the exact source of this risk is not currently identifiable or testable.  We discussed that 5 - 10% of cancer is hereditary. We discussed that testing is beneficial for several reasons including identifying appropriate screening options, preventative treatments and to understand if other family members could be at risk for cancer and allow them to undergo genetic testing.   We reviewed the characteristics, features and inheritance patterns of hereditary cancer syndromes. We also discussed genetic testing,  including the appropriate family members to test, the process of testing, insurance coverage and turn-around-time for results. We discussed the implications of a negative, positive, carrier and/or variant of uncertain significant result. Mary Pratt  was offered a common hereditary cancer panel (36+ genes) and an expanded pan-cancer panel (70+ genes). Mary Pratt was informed of the benefits and limitations of each panel, including that expanded pan-cancer panels contain genes that do not have clear management guidelines at this point in time.  We also discussed that as the number of genes included on a panel increases, the chances of variants of uncertain significance increases. Mary Pratt decided to pursue genetic testing for the 74 gene panel. The CancerNext-Expanded gene panel offered by [facility] and includes sequencing, rearrangement, and RNA analysis for the following 77 genes: AIP, ALK, APC, ATM, AXIN2, BAP1m BARD1, BMPR1Am BRCA1, BRCA2, BRIP1, CDC73, CDH1, CDK4, CDKN1B, CDKN2A, CEBPA, CHEK2, CTNNA1, DDX41, DICER1, EGFR, EPCAM, ETV6, FH, FLCN, GATA2, GREM1, HOXB13, KIT, LZTR1, MAX, MBD4, MEN1, MET, MITF, MLH1, MSH2, MSH3, MSH6, MUTYH, NF1, NF2, NTHL1, PALB2, PDGFRA, PHOX2B, PMS2, POLD1, POLE, POT1, PRKAR1A, PTCH1m PTEN, RAD51C, RAD51D, RB1, RET, RPS20, RUNX1, SDHA, SDHAF2, SDHB, SDHC, SDHD, SMAD4, SMARCA4, SMARCB1, SMARCE1, STK11, SUFU, TMEM127, TP53, TSC1, TSC2, VHL, WT1.   Based on Mary Pratt's family history of cancer, she meets medical criteria for genetic testing. Though Mary Pratt is not personally affected, there are no affected family members that are willing/able/available to undergo hereditary cancer testing.  Therefore, Mary Pratt the most informative family member available.  Despite that she meets criteria, she may still have an out of pocket cost. We discussed that if her out of pocket cost for testing is over $100, the laboratory will call and confirm whether she wants to proceed with  testing.  If the out of pocket cost of testing is less than $100 she will be billed by the genetic testing laboratory.   We discussed that some people do not want to undergo genetic testing due to fear of genetic discrimination.  The Genetic Information Nondiscrimination Act (GINA) was signed into federal law in 2008. GINA prohibits health insurers and most employers from discriminating  against individuals based on genetic information (including the results of genetic tests and family history information). According to GINA, health insurance companies cannot consider genetic information to be a preexisting condition, nor can they use it to make decisions regarding coverage or rates. GINA also makes it illegal for most employers to use genetic information in making decisions about hiring, firing, promotion, or terms of employment. It is important to note that GINA does not offer protections for life insurance, disability insurance, or long-term care insurance. GINA does not apply to those in the [employer], those who work for companies with less than 15 employees, and new life insurance or long-term disability insurance policies.  Health status due to a cancer diagnosis is not protected under GINA. More information about GINA can be found by visiting [email or web address].  The Tyrer-Cuzick model is one of multiple prediction models developed to estimate an individual's lifetime risk of developing breast cancer. The Tyrer-Cuzick model is endorsed by the [employer] ([employer]). This model includes many risk factors such as family history, endogenous estrogen exposure, and benign breast disease. The calculation is highly-dependent on the accuracy of clinical data provided by the patient and can change over time. The Tyrer-Cuzick model may be repeated to reflect new information in her personal or family history in the future. Will run and discuss with return of results.   PLAN: After considering the  risks, benefits, and limitations, Ms. Calixto provided informed consent to pursue genetic testing and the blood sample was sent to [facility] for analysis of the CancerNext-Expanded +RNAinsight panel. Results should be available within approximately 2-3 weeks' time, at which point they will be disclosed by telephone to Mary Pratt, as will any additional recommendations warranted by these results. Mary Pratt will receive a summary of her genetic counseling visit and a copy of her results once available. This information will also be available in Epic.   Lastly, we encouraged Mary Pratt to remain in contact with cancer genetics annually so that we can continuously update the family history and inform her of any changes in cancer genetics and testing that may be of benefit for this family.   Mary Pratt questions were answered to her satisfaction today. Our contact information was provided should additional questions or concerns arise. Thank you for the referral and allowing us  to share in the care of your patient.   Mary Mulders, MS, [facility] Licensed, Retail [email or web address] phone: [phone]  45 minutes were spent on the date of the encounter in service to the patient including preparation, face-to-face consultation, documentation and care coordination.  The patient was seen alone.  Drs. Johnna Nakai, and/or Gudena were available for questions, if needed..    _______________________________________________________________________ For Office Staff:  Number of people involved in session: 1 Was an Intern/ student involved with case: yes. GC Intern, Mary Pratt, provided counseling in this visit with direct supervision by me.

## 2024-02-08 ENCOUNTER — Ambulatory Visit
Admission: RE | Admit: 2024-02-08 | Discharge: 2024-02-08 | Disposition: A | Discharge status: Home / Self Care | Source: Ambulatory Visit | Attending: Physician Assistant

## 2024-02-08 ENCOUNTER — Encounter: Payer: Self-pay | Admitting: Physician Assistant

## 2024-02-08 ENCOUNTER — Other Ambulatory Visit (INDEPENDENT_AMBULATORY_CARE_PROVIDER_SITE_OTHER)

## 2024-02-08 ENCOUNTER — Ambulatory Visit: Admitting: Physician Assistant

## 2024-02-08 VITALS — BP 108/60 | HR 77 | Ht 61.0 in | Wt 167.0 lb

## 2024-02-08 DIAGNOSIS — R194 Change in bowel habit: Secondary | ICD-10-CM

## 2024-02-08 DIAGNOSIS — R5383 Other fatigue: Secondary | ICD-10-CM | POA: Diagnosis not present

## 2024-02-08 DIAGNOSIS — R109 Unspecified abdominal pain: Secondary | ICD-10-CM | POA: Diagnosis not present

## 2024-02-08 DIAGNOSIS — R110 Nausea: Secondary | ICD-10-CM

## 2024-02-08 DIAGNOSIS — R1084 Generalized abdominal pain: Secondary | ICD-10-CM

## 2024-02-08 LAB — IBC + FERRITIN
Ferritin: 25.8 ng/mL (ref 10.0–291.0)
Iron: 97 ug/dL (ref 42–145)
Saturation Ratios: 26.8 % (ref 20.0–50.0)
TIBC: 362.6 ug/dL (ref 250.0–450.0)
Transferrin: 259 mg/dL (ref 212.0–360.0)

## 2024-02-08 NOTE — Progress Notes (Signed)
 Chief Complaint: Lower abdominal pain   HPI:    Mary Pratt is a 53 year old female with a past medical history as listed below including CHF, prior DVT, IBS, cardiac defibrillator in place for nonischemic cardiomyopathy on Eliquis , and multiple others, who was referred to me by Leesa Pulling, MD for a complaint of lower abdominal pain.      01/03/24 office visit with Dr. Willy Harvest and at that time discussed abdominal pain and dyspepsia with black stool.  It was thought this is likely functional.  Hemoccult negative.  At that time discussed breath testing for SIBO negative in March 2024 and repeat EGD.    01/20/2024 EGD with Z-line irregular and otherwise normal.  Pathology normal.   5 /25/25 patient seen in the ER for lower back pain, nausea and constipation that been going on for a week.  Labs including a CMP with potassium 3.4, CBC with a hemoglobin 11.7.  Lipase normal.CTAP with no acute findings, small periumbilical hernia containing only mesenteric fat.  Patient told to start Bentyl  and Colace.    Today, patient presents to clinic and tells me that she remains fatigued and has low back pain that radiates all the way around into her stomach and feels severely bloated and almost short of breath when she lays down.  She radiates from diarrhea to constipation and feels like she get waves of pain in her abdomen that come and go.  When she eats she feels very full.  Has some nausea but no vomiting.  Also complains of night sweats.  Apparently talked this over with her PCP.  She just wants to know what is going on.    Denies fever, chills, weight loss, vomiting or symptoms that awaken her from sleep.  Past Medical History:  Diagnosis Date   Abnormal pap 05/2012   ASCUS + HPV, 11/14 LGSIL   Anemia    Anxiety    Asthma    Cardiac defibrillator in place    Chronic systolic CHF (congestive heart failure) (HCC)    a. 02/2015 Echo: EF 20%, sev dil w/ diff HK, worse @ inf base. Tiv AI, mild MR, mod dil  LA, PASP .   DVT of lower extremity (deep venous thrombosis) (HCC)    Functional dyspepsia    early satiety    GERD with stricture    dilated 2010   HELICOBACTER PYLORI GASTRITIS 01/15/2009   dyspnea, ? reaction to Biaxin/amoxicillin  Pylera 01/15/09 - completed therapy      IBS (irritable bowel syndrome)    NICM (nonischemic cardiomyopathy) (HCC)    a. 02/2015 Cath: nl cors, EF 15%, mild PAH;  b. 02/2015 Enrolled in VEST trial.   Sickle cell trait (HCC)    Situational depression    "son died"   Vitamin B 12 deficiency 11/2022    Past Surgical History:  Procedure Laterality Date   BIV ICD GENERATOR CHANGEOUT N/A 01/24/2021   Procedure: BIV ICD GENERATOR CHANGEOUT;  Surgeon: Verona Goodwill, MD;  [facility] INVASIVE CV LAB;  Service: Cardiovascular;  Laterality: N/A;   CARDIAC CATHETERIZATION N/A 02/26/2015   Procedure: Right/Left Heart Cath and Coronary Angiography;  Surgeon: Millicent Ally, MD;  Location: [facility] INVASIVE CV LAB;  Service: Cardiovascular;  Laterality: N/A;   COLPOSCOPY  09/08/2011   neg   EP IMPLANTABLE DEVICE N/A 10/14/2015   Procedure: BiV ICD Insertion CRT-D;  Surgeon: Verona Goodwill, MD;  Location: [facility] INVASIVE CV LAB;  Service: Cardiovascular;  Laterality: N/A;  ESOPHAGOGASTRODUODENOSCOPY (EGD) WITH ESOPHAGEAL DILATION  11/30/2008   esophageal ring dilated 54 French, H. pylori gastritis   HARVEST BONE GRAFT  02/2023   TUBAL LIGATION  09/07/1989   WISDOM TOOTH EXTRACTION     WRIST SURGERY  2023    Current Outpatient Medications  Medication Sig Dispense Refill   albuterol  (VENTOLIN  HFA) 108 (90 Base) MCG/ACT inhaler Inhale 1-2 puffs into the lungs every 4 (four) hours as needed. 18 g 1   apixaban  (ELIQUIS ) 5 MG TABS tablet Take 1 tablet (5 mg total) by mouth 2 (two) times daily. Start taking after completion of starter pack. 60 tablet 4   Azelastine  HCl 137 MCG/SPRAY SOLN Place 2 sprays into both nostrils 2 (two) times daily.     BREO ELLIPTA 200-25  MCG/ACT AEPB Inhale 1 puff into the lungs daily.     carvedilol  (COREG ) 25 MG tablet TAKE 1 TABLET (25 MG TOTAL) BY MOUTH TWICE A DAY WITH MEALS 180 tablet 1   clobetasol ointment (TEMOVATE) 0.05 % Apply 1 Application topically 2 (two) times daily.     Cyanocobalamin  (VITAMIN B-12 IJ) Inject as directed every 30 (thirty) days.     dicyclomine  (BENTYL ) 20 MG tablet Take 1 tablet (20 mg total) by mouth 2 (two) times daily. 20 tablet 0   docusate sodium  (COLACE) 100 MG capsule Take 1 capsule (100 mg total) by mouth every 12 (twelve) hours. 30 capsule 0   EPINEPHRINE  0.3 mg/0.3 mL IJ SOAJ injection INJECT 0.3 MG INTO THE MUSCLE AS NEEDED FOR ANAPHYLAXIS. 2 each 1   fexofenadine (ALLEGRA) 180 MG tablet Take 180 mg by mouth daily.     Fezolinetant  (VEOZAH ) 45 MG TABS Take 1 tablet (45 mg total) by mouth daily. 30 tablet 2   fluocinonide ointment (LIDEX) 0.05 % Apply 1 Application topically daily as needed (eczema).     fluticasone  (FLONASE ) 50 MCG/ACT nasal spray Place 2 sprays into both nostrils every morning. 48 g 1   fluticasone -salmeterol (ADVAIR DISKUS) 250-50 MCG/ACT AEPB Inhale 1 puff into the lungs in the morning. 60 each 2   furosemide  (LASIX ) 40 MG tablet Take 1 tablet (40 mg total) by mouth daily. 30 tablet 5   Galcanezumab -gnlm (EMGALITY ) 120 MG/ML SOAJ INJECT 240 MG INTO THE SKIN ONCE FOR 1 DOSE. LOADING DOSE (Patient taking differently: Inject 240 mg into the skin every 30 (thirty) days.) 1 mL 1   nitroGLYCERIN  (NITROSTAT ) 0.4 MG SL tablet Place 1 tablet (0.4 mg total) under the tongue every 5 (five) minutes x 3 doses as needed for chest pain. 25 tablet 12   pantoprazole  (PROTONIX ) 40 MG tablet Take 40 mg by mouth daily.     sacubitril -valsartan  (ENTRESTO ) 24-26 MG TAKE 1 TABLET BY MOUTH TWICE A DAY 60 tablet 6   sertraline  (ZOLOFT ) 100 MG tablet Take 100 mg by mouth daily.     spironolactone  (ALDACTONE ) 25 MG tablet TAKE 1 TABLET (25 MG TOTAL) BY MOUTH DAILY. 90 tablet 1   Vitamin D ,  Ergocalciferol , (DRISDOL) 1.25 MG (50000 UNIT) CAPS capsule Take 50,000 Units by mouth once a week.     No current facility-administered medications for this visit.    Allergies as of 02/08/2024 - Review Complete 02/08/2024  Allergen Reaction Noted   Clarithromycin Hives    Amoxicillin Palpitations    Meloxicam Rash 01/14/2022   Spiriva respimat [tiotropium bromide monohydrate] Rash 04/29/2021   Symbicort  [budesonide -formoterol  fumarate] Rash 03/26/2023    Family History  Problem Relation Age of Onset  Diabetes Mother    Hypertension Mother    Hypothyroidism Mother    Prostate cancer Father 55   Breast cancer Maternal Aunt 14   Breast cancer Maternal Aunt 69   Breast cancer Maternal Aunt 42   Brain cancer Maternal Uncle 19   Brain cancer Maternal Uncle 35   Asthma Paternal Aunt    Prostate cancer Paternal Uncle 63   Prostate cancer Paternal Uncle 36   Prostate cancer Paternal Uncle 60   Lung cancer Maternal Grandmother 77   Eczema Daughter    Allergic rhinitis Daughter    Colon cancer Neg Hx    Colon polyps Neg Hx    Esophageal cancer Neg Hx     Social History   Socioeconomic History   Marital status: Single    Spouse name: Not on file   Number of children: 3   Years of education: Not on file   Highest education level: Not on file  Occupational History   Occupation: texturing operator    Employer: [employer]  Tobacco Use   Smoking status: Never    Passive exposure: Never   Smokeless tobacco: Never  Vaping Use   Vaping status: Never Used  Substance and Sexual Activity   Alcohol use: Yes    Comment: occasional wine   Drug use: No   Sexual activity: Yes    Partners: Male    Birth control/protection: Surgical, Post-menopausal    Comment: BTL  Other Topics Concern   Not on file  Social History Narrative   Single, 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   2 sons 1 daughter 1 son deceased   Drinks caffeine  up to a few a day, never smoker no  drug use   Occ wine   Social Drivers of Corporate investment banker Strain: Not on file  Food Insecurity: No Food Insecurity (10/15/2023)   Hunger Vital Sign    Worried About Running Out of Food in the Last Year: Never true    Ran Out of Food in the Last Year: Never true  Transportation Needs: No Transportation Needs (10/15/2023)   PRAPARE - Administrator, Civil Service (Medical): No    Lack of Transportation (Non-Medical): No  Physical Activity: Not on file  Stress: Not on file  Social Connections: Unknown (01/20/2022)   Received from [facility]   Social Network    Social Network: Not on file  Intimate Partner Violence: Not At Risk (10/15/2023)   Humiliation, Afraid, Rape, and Kick questionnaire    Fear of Current or Ex-Partner: No    Emotionally Abused: No    Physically Abused: No    Sexually Abused: No    Review of Systems:    Constitutional: No weight loss, fever or chills Cardiovascular: No chest pain Respiratory: No SOB  Gastrointestinal: See HPI and otherwise negative   Physical Exam:  Vital signs: BP 100/62   Pulse 77   Ht 5\' 1"  (1.549 m)   Wt 167 lb (75.8 kg)   LMP 09/14/2022 (Approximate)   BMI 31.55 kg/m    Constitutional:   Pleasant AA female appears to be in NAD, Well developed, Well nourished, alert and cooperative Respiratory: Respirations even and unlabored. Lungs clear to auscultation bilaterally.   No wheezes, crackles, or rhonchi.  Cardiovascular: Normal S1, S2. No MRG. Regular rate and rhythm. No peripheral edema, cyanosis or pallor.  Gastrointestinal:  Soft, nondistended, mild generalized ttp, No rebound or guarding.  Decreased bowel  sounds. No appreciable masses or hepatomegaly. Rectal:  Not performed.  Psychiatric: Oriented to person, place and time. Demonstrates good judgement and reason without abnormal affect or behaviors.  RELEVANT LABS AND IMAGING: CBC    Component Value Date/Time   WBC 4.8 01/30/2024 1522    RBC 4.30 01/30/2024 1522   HGB 11.7 (L) 01/30/2024 1522   HGB 12.9 01/22/2021 1319   HGB 13.6 08/02/2013 1447   HCT 35.3 (L) 01/30/2024 1522   HCT 38.2 01/22/2021 1319   PLT 202 01/30/2024 1522   PLT 236 01/22/2021 1319   MCV 82.1 01/30/2024 1522   MCV 83 01/22/2021 1319   MCH 27.2 01/30/2024 1522   MCHC 33.1 01/30/2024 1522   RDW 15.0 01/30/2024 1522   RDW 15.2 01/22/2021 1319   LYMPHSABS 1.6 01/30/2024 1522   MONOABS 0.5 01/30/2024 1522   EOSABS 0.2 01/30/2024 1522   BASOSABS 0.0 01/30/2024 1522    CMP     Component Value Date/Time   NA 137 01/30/2024 1522   NA 139 11/10/2022 1409   K 3.4 (L) 01/30/2024 1522   CL 100 01/30/2024 1522   CO2 25 01/30/2024 1522   GLUCOSE 104 (H) 01/30/2024 1522   BUN 15 01/30/2024 1522   BUN 10 11/10/2022 1409   CREATININE 0.82 01/30/2024 1522   CREATININE 0.91 01/22/2016 1308   CALCIUM 8.9 01/30/2024 1522   PROT 7.4 01/30/2024 1522   ALBUMIN 3.7 01/30/2024 1522   AST 23 01/30/2024 1522   ALT 26 01/30/2024 1522   ALKPHOS 82 01/30/2024 1522   BILITOT 0.9 01/30/2024 1522   GFRNONAA >60 01/30/2024 1522   GFRAA 87 12/15/2019 1602    Assessment: 1.  Generalized abdominal pain: With constipation?  Reviewed recent CT as well as labs, as well as EGD, nothing abnormal 2.  Change in bowel habits: Initially constipated, now describes diarrhea but never feeling empty with nausea and some early satiety; consider constipation with overflow versus other 3.  Fatigue: Consider relation to iron deficiency anemia versus chronic heart disease versus deconditioning versus other  Plan: 1.  Discussed with patient that she may be experiencing constipation with overflow.  Ordered two-view abdominal x-ray today. 2.  Ordered repeat iron studies.  These were normal back in February patient continues to describe fatigue and even some night sweats.  Recommend she continue to follow with her PCP about this. 3.  Pending x-ray may need to consider colonoscopy.  She  is on a blood thinner which would need to be stopped. 4.  Patient to follow in clinic per recommendations after x-ray above.  Reginal Capra, PA-C [facility] 02/08/2024, 2:33 PM  Cc: Leesa Pulling, MD

## 2024-02-08 NOTE — Patient Instructions (Signed)
 Your provider has requested that you go to the basement level for lab work before leaving today. Press "B" on the elevator. The lab is located at the first door on the left as you exit the elevator.  Your provider has requested that you have an abdominal x ray before leaving today. Please go to the basement floor to our Radiology department for the test.  _______________________________________________________  If your blood pressure at your visit was 140/90 or greater, please contact your primary care physician to follow up on this.  _______________________________________________________  If you are age 59 or older, your body mass index should be between 23-30. Your Body mass index is 31.55 kg/m. If this is out of the aforementioned range listed, please consider follow up with your Primary Care Provider.  If you are age 48 or younger, your body mass index should be between 19-25. Your Body mass index is 31.55 kg/m. If this is out of the aformentioned range listed, please consider follow up with your Primary Care Provider.   ________________________________________________________  The [facility] providers would like to encourage you to use MYCHART to communicate with providers for non-urgent requests or questions.  Due to long hold times on the telephone, sending your provider a message by [facility] may be a faster and more efficient way to get a response.  Please allow 48 business hours for a response.  Please remember that this is for non-urgent requests.  _______________________________________________________

## 2024-02-09 ENCOUNTER — Ambulatory Visit: Payer: Self-pay | Admitting: Physician Assistant

## 2024-02-11 ENCOUNTER — Ambulatory Visit (HOSPITAL_BASED_OUTPATIENT_CLINIC_OR_DEPARTMENT_OTHER): Attending: Family Medicine | Admitting: Internal Medicine

## 2024-02-11 ENCOUNTER — Encounter (HOSPITAL_BASED_OUTPATIENT_CLINIC_OR_DEPARTMENT_OTHER): Payer: Self-pay

## 2024-02-11 DIAGNOSIS — R5383 Other fatigue: Secondary | ICD-10-CM

## 2024-02-11 DIAGNOSIS — R0683 Snoring: Secondary | ICD-10-CM | POA: Diagnosis present

## 2024-02-14 ENCOUNTER — Other Ambulatory Visit: Payer: Self-pay

## 2024-02-14 ENCOUNTER — Telehealth: Payer: Self-pay

## 2024-02-14 ENCOUNTER — Institutional Professional Consult (permissible substitution): Admitting: Primary Care

## 2024-02-14 DIAGNOSIS — R109 Unspecified abdominal pain: Secondary | ICD-10-CM

## 2024-02-14 DIAGNOSIS — R194 Change in bowel habit: Secondary | ICD-10-CM

## 2024-02-14 MED ORDER — NA SULFATE-K SULFATE-MG SULF 17.5-3.13-1.6 GM/177ML PO SOLN: ORAL | 0 refills | Status: DC

## 2024-02-14 NOTE — Telephone Encounter (Signed)
  Mary Pratt Sep 19, 1970 [id number]   Dear [name],  We have scheduled the above named patient for a colonoscopy procedure. Our records show that she is on anticoagulation therapy.  Please advise as to whether the patient may come off their therapy of Eliquis  2 days prior to their procedure which is scheduled for 02/23/24.  Please route your response to Heber Little, LPN or fax response to [phone].  Sincerely,    [facility]

## 2024-02-16 ENCOUNTER — Ambulatory Visit: Admitting: Obstetrics and Gynecology

## 2024-02-16 VITALS — BP 104/68 | HR 78 | Wt 167.8 lb

## 2024-02-16 DIAGNOSIS — L280 Lichen simplex chronicus: Secondary | ICD-10-CM

## 2024-02-17 ENCOUNTER — Ambulatory Visit: Admitting: Gastroenterology

## 2024-02-17 MED ORDER — CLOBETASOL PROPIONATE 0.05 % EX OINT: 1.0000 | TOPICAL_OINTMENT | CUTANEOUS | 1 refills | Status: AC

## 2024-02-17 MED ORDER — ESTRADIOL 0.1 MG/GM VA CREA: TOPICAL_CREAM | VAGINAL | 12 refills | Status: DC

## 2024-02-17 NOTE — Progress Notes (Signed)
   Acute Office Visit  Subjective:    Patient ID: Mary Pratt, female    DOB: May 01, 1971, 53 y.o.   MRN: [id number]   HPI 53 y.o. presents today for Consult (Discuss Lichen Sclerosus treatment. (Labial biopsy 01/27/24)) . 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    Review of Systems     Objective:    OBGyn Exam  BP 104/68 (BP Location: Left Arm, Patient Position: Sitting, Cuff Size: Large)   Pulse 78   Wt 167 lb 12.8 oz (76.1 kg)   LMP 09/14/2022 (Approximate)   SpO2 98%   BMI 27.08 kg/m  Wt Readings from Last 3 Encounters:  02/16/24 167 lb 12.8 oz (76.1 kg)  02/11/24 165 lb (74.8 kg)  02/08/24 167 lb (75.8 kg)        Patient informed chaperone available to be present for breast and/or pelvic exam. Patient has requested no chaperone to be present. Patient has been advised what will be completed during breast and pelvic exam.   Assessment & Plan:  Patient to use estrace  cream three times a week without flares and with LS flares to use it daily with prescribed dose of clobetasol.   Counseled on the medications and on the LS condition.  Discussed small risk for cancer with LS and to have periodic biopsies in the future, especially when the flares become worse or more persistent.  Counseled to only use the steroid cream sparingly and only with flares.  20 minutes spent on reviewing records, imaging,  and one on one patient time and counseling patient and documentation Dr. Caro Christmas

## 2024-02-18 ENCOUNTER — Encounter: Payer: Self-pay | Admitting: Obstetrics and Gynecology

## 2024-02-19 DIAGNOSIS — R5383 Other fatigue: Secondary | ICD-10-CM | POA: Diagnosis not present

## 2024-02-19 NOTE — Procedures (Signed)
 Maryan Smalling [facility] [address] [place], [place] [address] Tel: [phone]   Fax: [phone]  Polysomnography Interpretation  Patient Name:  Mary Pratt, Mary Pratt Date:  02/11/2024 Referring Physician:  Rosa [employer] MD  Indications for Polysomnography The patient is a 53 year old Female who is 5' and weighs 165.0 lbs. Her BMI equals 32.0.  A full night polysomnogram was performed to evaluate for -OSA  Medication  No Data.   Polysomnogram Data A full night polysomnogram recorded the standard physiologic parameters including EEG, EOG, EMG, EKG, nasal and oral airflow.  Respiratory parameters of chest and abdominal movements were recorded with Respiratory Inductance Plethysmography belts.  Oxygen saturation was recorded by pulse oximetry.   Sleep Architecture The total recording time of the polysomnogram was 363.1 minutes.  The total sleep time was 163.0 minutes.  The patient spent 0.3% of total sleep time in Stage N1, 22.4% in Stage N2, 77.3% in Stages N3, and 0.0% in REM.  Sleep latency was 60.6 minutes.  REM latency was - minutes.  Sleep Efficiency was 44.9%.  Wake after Sleep Onset time was 139.5 minutes.  Respiratory Events The polysomnogram revealed a presence of 2 obstructive, - central, and - mixed apneas resulting in an Apnea index of 0.7 events per hour.  There were 4 hypopneas (>=3% desaturation and/or arousal) resulting in an Apnea\Hypopnea Index (AHI >=3% desaturation and/or arousal) of 2.2 events per hour.  There were 3 hypopneas (>=4% desaturation) resulting in an Apnea\Hypopnea Index (AHI >=4% desaturation) of 1.8 events per hour.  There were - Respiratory Effort Related Arousals resulting in a RERA index of - events per hour. The Respiratory Disturbance Index is 2.2 events per hour.  The snore index was - events per hour.  Mean oxygen saturation was 94.0%.  The lowest oxygen saturation during sleep was 89.0%.  Time spent <=88% oxygen  saturation was - minutes (-).  End Tidal CO2 during sleep ranged from - to - mmHg. End Tidal CO2 was greater than 50 mmHg for - minutes and greater than 55 mmHg for - minutes.  Limb Activity There were - total limb movements recorded, of this total, - were classified as PLMs.  PLM index was - per hour and PLM associated with Arousals index was - per hour.  Cardiac Summary The average pulse rate was 67.9 bpm.  The minimum pulse rate was 52.0 bpm while the maximum pulse rate was 94.0 bpm.  Cardiac rhythm was normal with frequent PVCs- couplets, triplets.  Comment: Occasional apneas and hypopneas, within normal limits, AHI (3%) 2.2/hr. Loud snoring with oxygen desaturation to a nadir of 89%, mean 94%. Technically difficult study due to patient restlessness. See Technician comment at end of report.    Diagnosis: Primary snoring  Recommendations: Manage for snoring and symptoms based on clinical judgment.   This study was personally reviewed and electronically signed by: [employer] Accredited Board Certified in Sleep Medicine Date/Time: 02/19/24  11:32        Diagnostic PSG Report  Patient Name: Mary Pratt, Mary Pratt Date: 02/11/2024  Date of Birth: 01-10-1971 Study Type: Diagnostic  Age: 25 year MRN #: [id number]  Sex: Female Interpreting Physician: [employer] [id number]  Height: 5' Referring Physician: Rosa [employer] MD  Weight: 165.0 lbs Recording Tech: Adel Holt McConnico RPSGT RST  BMI: 32.0 Scoring Tech: Adel Holt McConnico RPSGT RST  ESS: 9 Neck Size: 15   Study Overview  Lights Off: 11:12:55 PM  Count Index  Lights On: 05:16:01 AM  Awakenings: 27 9.9  Time in Bed: 363.1 min. Arousals: 3 1.1  Total Sleep Time: 163.0 min. AHI (>=3% Desat and/or Ar.): 6 2.2   Sleep Efficiency: 44.9% AHI (>=4% Desat): 5 1.8   Sleep Latency: 60.6 min. Limb Movements: - -  Wake After Sleep Onset: 139.5 min. Snore: - -  REM Latency from Sleep Onset: - min. Desaturations: 32 11.8     Minimum  SpO2 TST: 89.0%    Sleep Architecture  % of Time in Bed Stages Time (mins) % Sleep Time  Wake 200.5   Stage N1 0.5 0.3%  Stage N2 36.5 22.4%  Stage N3 126.0 77.3%  REM 0.0 0.0%   Arousal Summary   NREM REM Sleep Index  Respiratory Arousals - - - -  PLM Arousals - - - -  Isolated Limb Movement Arousals - - - -  Snore Arousals - - - -  Spontaneous Arousals 3 - 3 1.1  Total 3 - 3 1.1   Limb Movement Summary   Count Index  Isolated Limb Movements - -  Periodic Limb Movements (PLMs) - -  Total Limb Movements - -    Respiratory Summary   By Sleep Stage By Body Position Total   NREM REM Supine Non-Supine   Time (min) 163.0 0.0 4.0 159.0 163.0         Obstructive Apnea 2 - - 2 2  Mixed Apnea - - - - -  Central Apnea - - - - -  Total Apneas 2 - - 2 2  Total Apnea Index 0.7 - - 0.8 0.7         Hypopneas (>=3% Desat and/or Ar.) 4 - - 4 4  AHI (>=3% Desat and/or Ar.) 2.2 - - 2.3 2.2         Hypopneas (>=4% Desat) 3 - - 3 3  AHI (>=4% Desat) 1.8 - - 1.9 1.8          RERAs - - - - -  RERA Index - - - - -         RDI 2.2 - - 2.3 2.2    Respiratory Event Type Index  Central Apneas -  Obstructive Apneas 0.7  Mixed Apneas -  Central Hypopneas -  Obstructive Hypopneas 1.5  Central Apnea + Hypopnea (CAHI) -  Obstructive Apnea + Hypopnea (OAHI) 2.2   Respiratory Event Durations   Apnea Hypopnea   NREM REM NREM REM  Average (seconds) 18.1 - 17.4 -  Maximum (seconds) 18.9 - 19.9 -    Oxygen Saturation Summary   Wake NREM REM TST TIB  Average SpO2 (%) 94.4% 93.4% - 93.4% 94.0%  Minimum SpO2 (%) 89.0% 89.0% - 89.0% 89.0%  Maximum SpO2 (%) 99.0% 98.0% - 98.0% 99.0%   Oxygen Saturation Distribution  Range (%) Time in range (min) Time in range (%)  90.0 - 100.0 354.6 98.6%  80.0 - 90.0 5.0 1.4%  70.0 - 80.0 - -  60.0 - 70.0 - -  50.0 - 60.0 - -  0.0 - 50.0 - -  Time Spent <=88% SpO2  Range (%) Time in range (min) Time in range (%)  0.0 - 88.0 - -       Count Index  Desaturations 32 11.8    Cardiac Summary   Wake NREM REM Sleep Total  Average Pulse Rate (BPM) 70.1 65.4 - 65.4 67.9  Minimum Pulse Rate (BPM) 52.0 52.0 - 52.0 52.0  Maximum Pulse Rate (BPM) 94.0 80.0 -  80.0 94.0   Pulse Rate Distribution:  Range (bpm) Time in range (min) Time in range (%)  0.0 - 40.0 - -  40.0 - 60.0 35.8 9.8%  60.0 - 80.0 319.6 87.9%  80.0 - 100.0 8.2 2.2%  100.0 - 120.0 - -  120.0 - 140.0 - -  140.0 - 200.0 - -   EtCO2 Summary  Stage Min (mmHg) Average (mmHg) Max (mmHg)  Wake - - -  NREM(1+2+3) - - -  REM - - -   EtCO2 Distribution:  Range (mmHg) Time in range (min) Time in range (%)  20.0 - 40.0 - -  40.0 - 50.0 - -  50.0 - 100.0 - -  55.0 - 100.0 - -  Excluded data <20.0 & >65.0 363.5 100.0%     Hypnograms                         Technologist Comments  Pt had no idea what the sleep study was for. We had a long conversation about what we would be looking at during the night.  She was acceptable to the study.  I also explained the split night study.  I then fitted her with a mask She did not like the nasal at all but did fine with the Fisher-Paykel Simplus FF mask small.  She seemed to be getting sleepy but as soon as I turned the lights out, she started to toss and turn.  She could not get comfortable and get to sleep. She tried very much.  At 1:20, she was still awake.   She had asked if I could give her something and I told her I could not.  She wanted to know if she can't go to sleep what happens. I explained that she would have to repeat the study possible with a sleep aid from her doctor with her. She is going to try harder.  I have propped her up with the pillows which she thought might be more comfortable. Pt 's study is very difficult to read due to all the tossing and turning.  I have been in the room trying to tighten and repair but I hated to wake her. I have brought the chest and abdomen as low as they will  go.  I do not know what else to do.  Even the EKG leads are going everywhere and I checked them and they are on her tightly.  She does snore quite loudly and has an irregular heartbeat.                          [employer] Diplomate, Biomedical engineer of Sleep Medicine  ELECTRONICALLY SIGNED ON:  02/19/2024, 11:25 AM [facility] PH: [phone]   FX: [phone] ACCREDITED BY THE AMERICAN ACADEMY OF SLEEP MEDICINE

## 2024-02-23 ENCOUNTER — Encounter: Payer: Self-pay | Admitting: Internal Medicine

## 2024-02-23 ENCOUNTER — Telehealth: Payer: Self-pay | Admitting: Genetic Counselor

## 2024-02-23 ENCOUNTER — Ambulatory Visit (AMBULATORY_SURGERY_CENTER): Admitting: Internal Medicine

## 2024-02-23 VITALS — BP 107/67 | HR 77 | Temp 97.9°F | Resp 14 | Ht 61.0 in | Wt 167.0 lb

## 2024-02-23 DIAGNOSIS — R194 Change in bowel habit: Secondary | ICD-10-CM | POA: Diagnosis not present

## 2024-02-23 MED ORDER — AMITRIPTYLINE HCL 25 MG PO TABS: 25.0000 mg | ORAL_TABLET | Freq: Every day | ORAL | 3 refills | Status: DC

## 2024-02-23 MED ORDER — SODIUM CHLORIDE 0.9 % IV SOLN: 500.0000 mL | Freq: Once | INTRAVENOUS | Status: DC

## 2024-02-23 NOTE — Op Note (Addendum)
  [facility] Patient Name: Mary Pratt Procedure Date: 02/23/2024 10:47 AM MRN: [id number] Endoscopist: Kenney Peacemaker , MD, [id number] Age: 53 Referring MD:  Date of Birth: 05/20/1971 Gender: Female Account #: [id number] Procedure:                Colonoscopy Indications:              Change in bowel habits, bloating Medicines:                Monitored Anesthesia Care Procedure:                Pre-Anesthesia Assessment:                           - Prior to the procedure, a History and Physical                            was performed, and patient medications and                            allergies were reviewed. The patient's tolerance of                            previous anesthesia was also reviewed. The risks                            and benefits of the procedure and the sedation                            options and risks were discussed with the patient.                            All questions were answered, and informed consent                            was obtained. Prior Anticoagulants: The patient                            last took Eliquis  (apixaban ) 2 days prior to the                            procedure. ASA Grade Assessment: III - A patient                            with severe systemic disease. After reviewing the                            risks and benefits, the patient was deemed in                            satisfactory condition to undergo the procedure.                           After obtaining informed consent, the colonoscope  was passed under direct vision. Throughout the                            procedure, the patient's blood pressure, pulse, and                            oxygen saturations were monitored continuously. The                            Olympus CF-HQ190L ([id number]) Colonoscope was                            introduced through the anus and advanced to the the                            cecum,  identified by appendiceal orifice and                            ileocecal valve. The colonoscopy was performed                            without difficulty. The patient tolerated the                            procedure well. The quality of the bowel                            preparation was good. The terminal ileum, ileocecal                            valve, appendiceal orifice, and rectum were                            photographed. The bowel preparation used was SUPREP                            via split dose instruction. Scope In: 11:06:23 AM Scope Out: 11:12:53 AM Scope Withdrawal Time: 0 hours 4 minutes 53 seconds  Total Procedure Duration: 0 hours 6 minutes 30 seconds  Findings:                 The perianal and digital rectal examinations were                            normal.                           The terminal ileum appeared normal.                           The entire examined colon appeared normal on direct                            and retroflexion views. Complications:            No immediate complications. Estimated Blood  Loss:     Estimated blood loss: none. Impression:               - The examined portion of the ileum was normal.                           - The entire examined colon is normal on direct and                            retroflexion views.                           - No specimens collected. Recommendation:           - Patient has a contact number available for                            emergencies. The signs and symptoms of potential                            delayed complications were discussed with the                            patient. Return to normal activities tomorrow.                            Written discharge instructions were provided to the                            patient.                           - Resume previous diet.                           - Continue present medications.                           - Repeat colonoscopy in  10 years for screening                            purposes.                           - I will offer amitriptyline for bloating/IBS - she                            started Veozah  for menopausal sxs about 1 month ago                            - discussed in recovery and Rx for amitriptyline 25                            mg qhs sent                           - Resume Eliquis  (apixaban ) at  prior dose today. Kenney Peacemaker, MD 02/23/2024 11:28:14 AM This report has been signed electronically.

## 2024-02-23 NOTE — Progress Notes (Signed)
 Pt's states no medical or surgical changes since previsit or office visit.

## 2024-02-23 NOTE — Patient Instructions (Addendum)
 Restart Abixaban ( Eliquis ) at prior dose   The colonoscopy was normal.  Cause of your symptoms not seen here.  I think you should consider taking a medication called amitriptyline and will discuss.  Next routine colonoscopy or other screening test in 10 years - 2035.  I appreciate the opportunity to care for you. Kenney Peacemaker, MD, FACG YOU HAD AN ENDOSCOPIC PROCEDURE TODAY AT THE [facility]:   Refer to the procedure report that was given to you for any specific questions about what was found during the examination.  If the procedure report does not answer your questions, please call your gastroenterologist to clarify.  If you requested that your care partner not be given the details of your procedure findings, then the procedure report has been included in a sealed envelope for you to review at your convenience later.  YOU SHOULD EXPECT: Some feelings of bloating in the abdomen. Passage of more gas than usual.  Walking can help get rid of the air that was put into your GI tract during the procedure and reduce the bloating. If you had a lower endoscopy (such as a colonoscopy or flexible sigmoidoscopy) you may notice spotting of blood in your stool or on the toilet paper. If you underwent a bowel prep for your procedure, you may not have a normal bowel movement for a few days.  Please Note:  You might notice some irritation and congestion in your nose or some drainage.  This is from the oxygen used during your procedure.  There is no need for concern and it should clear up in a day or so.  SYMPTOMS TO REPORT IMMEDIATELY:  Following lower endoscopy (colonoscopy or flexible sigmoidoscopy):  Excessive amounts of blood in the stool  Significant tenderness or worsening of abdominal pains  Swelling of the abdomen that is new, acute  Fever of 100F or higher  For urgent or emergent issues, a gastroenterologist can be reached at any hour by calling [phone]. Do not use MyChart  messaging for urgent concerns.    DIET:  We do recommend a small meal at first, but then you may proceed to your regular diet.  Drink plenty of fluids but you should avoid alcoholic beverages for 24 hours.  ACTIVITY:  You should plan to take it easy for the rest of today and you should NOT DRIVE or use heavy machinery until tomorrow (because of the sedation medicines used during the test).    FOLLOW UP: Our staff will call the number listed on your records the next business day following your procedure.  We will call around 7:15- 8:00 am to check on you and address any questions or concerns that you may have regarding the information given to you following your procedure. If we do not reach you, we will leave a message.     If any biopsies were taken you will be contacted by phone or by letter within the next 1-3 weeks.  Please call us  at [phone] if you have not heard about the biopsies in 3 weeks.    SIGNATURES/CONFIDENTIALITY: You and/or your care partner have signed paperwork which will be entered into your electronic medical record.  These signatures attest to the fact that that the information above on your After Visit Summary has been reviewed and is understood.  Full responsibility of the confidentiality of this discharge information lies with you and/or your care-partner.

## 2024-02-23 NOTE — Progress Notes (Signed)
 History and Physical Interval Note:  02/23/2024 10:41 AM  Mary Pratt  has presented today for endoscopic procedure(s), with the diagnosis of  Encounter Diagnosis  Name Primary?   Change in bowel habits Yes  .  The various methods of evaluation and treatment have been discussed with the patient and/or family. After consideration of risks, benefits and other options for treatment, the patient has consented to  the endoscopic procedure(s).   The patient's history has been reviewed, patient examined, no change in status, stable for endoscopic procedure(s).  I have reviewed the patient's chart and labs.  Questions were answered to the patient's satisfaction.     Kenney Peacemaker, MD, [facility]

## 2024-02-23 NOTE — Telephone Encounter (Signed)
 LVM asking for call back to review results of genetic testing.

## 2024-02-23 NOTE — Progress Notes (Signed)
 A/o x 3, VSS, gd SR's, pleased with anesthesia, report to RN

## 2024-02-24 ENCOUNTER — Telehealth: Payer: Self-pay

## 2024-02-24 ENCOUNTER — Ambulatory Visit: Payer: Self-pay | Admitting: Genetic Counselor

## 2024-02-24 DIAGNOSIS — Z1379 Encounter for other screening for genetic and chromosomal anomalies: Secondary | ICD-10-CM | POA: Insufficient documentation

## 2024-02-24 NOTE — Progress Notes (Signed)
 Remote ICD transmission.

## 2024-02-24 NOTE — Progress Notes (Signed)
 HPI:  Ms. Babe was previously seen in the [facility] clinic due to a family history of cancer and concerns regarding a hereditary predisposition to cancer. Please refer to our prior cancer genetics clinic note for more information regarding our discussion, assessment and recommendations, at the time. Ms. Guillot recent genetic test results were disclosed to her, as were recommendations warranted by these results. These results and recommendations are discussed in more detail below.  FAMILY HISTORY:  We obtained a detailed, 4-generation family history.  Significant diagnoses are listed below: Family History  Problem Relation Age of Onset   Diabetes Mother    Hypertension Mother    Hypothyroidism Mother    Prostate cancer Father 36   Breast cancer Maternal Aunt 48   Breast cancer Maternal Aunt 19   Breast cancer Maternal Aunt 83   Brain cancer Maternal Uncle 41   Brain cancer Maternal Uncle 35   Asthma Paternal Aunt    Prostate cancer Paternal Uncle 66   Prostate cancer Paternal Uncle 33   Prostate cancer Paternal Uncle 14   Lung cancer Maternal Grandmother 68   Eczema Daughter    Allergic rhinitis Daughter    Colon cancer Neg Hx    Colon polyps Neg Hx    Esophageal cancer Neg Hx       Ms. Beitz is unaware of previous family history of genetic testing for hereditary cancer risks. Patient's maternal ancestors are of African American descent, and paternal ancestors are of African American descent. There is no reported Ashkenazi Jewish ancestry.    Ms. Muhlestein reports that her father was diagnosed with prostate cancer at age 38, living at 62. She reports three paternal uncles diagnosed with prostate cancer at ages 39, 40, 57, deceased. She reports two maternal uncles diagnosed with brain cancer at age 69 and 73, deceased. She reports three paternal aunts diagnosed with breast cancer, one at 27 and is living at 79, one at 15 and is living at 1, and one at 42 that passed  at 70. She reports her maternal grandmother was diagnosed with lung cancer at age 57, passed away at 29.      GENETIC TEST RESULTS: Genetic testing reported out on 02/21/24 through the [facility] CancerNext-Expanded +RNAinsight  panel found no pathogenic mutations. The CancerNext-Expanded gene panel offered by [facility] and includes sequencing, rearrangement, and RNA analysis for the following 77 genes: AIP, ALK, APC, ATM, AXIN2, BAP1, BARD1, BMPR1A, BRCA1, BRCA2, BRIP1, CDC73, CDH1, CDK4, CDKN1B, CDKN2A, CEBPA, CHEK2, CTNNA1, DDX41, DICER1, EGFR, EPCAM, ETV6, FH, FLCN, GATA2, GREM1, HOXB13, KIT, LZTR1, MAX, MBD4, MEN1, MET, MITF, MLH1, MSH2, MSH3, MSH6, MUTYH, NF1, NF2, NTHL1, PALB2, PDGFRA, PHOX2B, PMS2, POLD1, POLE, POT1, PRKAR1A, PTCH1, PTEN, RAD51C, RAD51D, RB1, RET, RPS20, RUNX1, SDHA, SDHAF2, SDHB, SDHC, SDHD, SMAD4, SMARCA4, SMARCB1, SMARCE1, STK11, SUFU, TMEM127, TP53, TSC1, TSC2, VHL, WT1. The test report has been scanned into EPIC and is located under the Molecular Pathology section of the Results Review tab.  A portion of the result report is included below for reference.     We discussed with Ms. Schlosser that because current genetic testing is not perfect, it is possible there may be a gene mutation in one of these genes that current testing cannot detect, but that chance is small.  We also discussed, that there could be another gene that has not yet been discovered, or that we have not yet tested, that is responsible for the cancer diagnoses in the family. It is also  possible there is a hereditary cause for the cancer in the family that Ms. Aro did not inherit and therefore was not identified in her testing.  Therefore, it is important to remain in touch with cancer genetics in the future so that we can continue to offer Ms. Fell the most up to date genetic testing.   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  At this time, it is unknown if these variants are associated with increased cancer risk or if they are normal findings, but most variants such as these get reclassified to being inconsequential. They should not be used to make medical management decisions. With time, we suspect the lab will determine the significance of these variants, if any. If we do learn more about them, we will try to contact Ms. Grayson to discuss it further. However, it is important to stay in touch with us  periodically and keep the address and phone number up to date.  ADDITIONAL GENETIC TESTING: We discussed with Ms. Fickling that her genetic testing was fairly extensive.  If there are genes identified to increase cancer risk that can be analyzed in the future, we would be happy to discuss and coordinate this testing at that time.    CANCER SCREENING RECOMMENDATIONS: Ms. Boye test result is considered negative (normal).  This means that we have not identified a hereditary cause for her family history of cancer at this time. Most cancers happen by chance and this negative test suggests that her family history of cancer may fall into this category.    Possible reasons for Ms. Friedl's negative genetic test include:  1. There may be a gene mutation in one of these genes that current testing methods cannot detect but that chance is small.  2. There could be another gene that has not yet been discovered, or that we have not yet tested, that is responsible for the cancer diagnoses in the family.  3.  There may be no hereditary risk for cancer in the family. The cancers in Ms. Malenfant and/or her family may be sporadic/familial or due to other genetic and environmental factors. 4. It is also possible there is a hereditary cause for the cancer in the family that Ms. Nussbaumer did not inherit.  Therefore, it is recommended she continue to follow the cancer management and  screening guidelines provided by her primary healthcare provider. An individual's cancer risk and medical management are not determined by genetic test results alone. Overall cancer risk assessment incorporates additional factors, including personal medical history, family history, and any available genetic information that may result in a personalized plan for cancer prevention and surveillance  Given Ms. Blandino's family history, we must interpret these negative results with some caution.  Families with features suggestive of hereditary risk for cancer tend to have multiple family members with cancer, diagnoses in multiple generations and diagnoses before the age of 60. Ms. Teaney family exhibits some of these features. Thus, this result may simply reflect our current inability to detect all mutations within these genes or there may be a different gene that has not yet been discovered or tested.   An individual's cancer risk and medical management are not determined by genetic test results alone. Overall cancer risk assessment incorporates additional factors, including personal medical history, family history, and any available genetic information that may result in a personalized plan for  cancer prevention and surveillance.  Based on Ms. Dentinger's family of cancer, as well as her genetic test results, statistical models (Tyrer Cuzick/IBIS) were used to estimate her risk of developing breast. This estimates her lifetime risk of developing breast to be approximately 8.2% and her five year risk to be to 1.3%.  The patient's breast cancer risk is a preliminary estimate based on available information using one of several models endorsed by the [employer] (NCCN).  The NCCN recommends consideration of breast MRI screening as an adjunct to mammography for patients at high risk (defined as 20% or greater lifetime risk). Please note that a woman's breast cancer risk changes over time. It  may increase or decrease based on age and any changes to the personal and/or family medical history. The risks and recommendations listed above apply to this patient at this point in time. In the future, she may or may not be eligible for the same medical management strategies and, in some cases, other medical management strategies may become available to her. If she is interested in an updated breast cancer risk assessment at a later date, she can contact us .   RECOMMENDATIONS FOR FAMILY MEMBERS:  Individuals in this family might be at some increased risk of developing cancer, over the general population risk, simply due to the family history of cancer.  We recommended women in this family have a yearly mammogram beginning at age 11, or 33 years younger than the earliest onset of cancer, an annual clinical breast exam, and perform monthly breast self-exams. Women in this family should also have a gynecological exam as recommended by their primary provider. All family members should be referred for colonoscopy starting at age 13, or 79 years younger than the earliest onset of cancer.   It is also possible there is a hereditary cause for the cancer in Ms. Tiano's family that she did not inherit and therefore was not identified in her.  Based on Ms. Mccalip's family history, we recommended her relatives that have been diagnosed with cancer have genetic counseling and testing. Ms. Devora will let us  know if we can be of any assistance in coordinating genetic counseling and/or testing for this family member.   FOLLOW-UP: Lastly, we discussed with Ms. Releford that cancer genetics is a rapidly advancing field and it is possible that new genetic tests will be appropriate for her and/or her family members in the future. We encouraged her to remain in contact with cancer genetics on an annual basis so we can update her personal and family histories and let her know of advances in cancer genetics that may benefit this  family.   Our contact number was provided. Ms. Largent questions were answered to her satisfaction, and she knows she is welcome to call us  at anytime with additional questions or concerns.   Jobie Mulders, MS, [facility] Licensed, Retail [email or web address] [phone]

## 2024-02-24 NOTE — Telephone Encounter (Signed)
 No answer, left message to call if having any issues or concerns, B.Vale Mousseau RN

## 2024-02-24 NOTE — Telephone Encounter (Signed)
 I spoke to Mary Pratt to review results of genetic testing. she had genetic testing with [facility] CancerNext-Expanded +RNAinsight panel. 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 We discussed that we do not use VUS to make management decisions or identify at risk family members. Discussed that we do not know why there is cancer in the family. It could be due to a different gene that we are not testing, or maybe our current technology may not be able to pick something up.  It will be important for her to keep in contact with genetics to keep up with whether additional testing may be needed. We will contact her if new information is learned about the VUS.   Please see counseling note for further detail on this result.

## 2024-02-28 NOTE — Telephone Encounter (Signed)
 PA submitted to plan via [email or web address]  KEY [id number]

## 2024-02-29 MED ORDER — PREMARIN 0.625 MG/GM VA CREA: 1.0000 | TOPICAL_CREAM | VAGINAL | 0 refills | Status: AC

## 2024-02-29 NOTE — Telephone Encounter (Signed)
 Kate can you check on this. I could not find any information regarding the PA approval or denial. Key would not work

## 2024-02-29 NOTE — Telephone Encounter (Signed)
 PA for estradiol  0.1%  vaginal cream denied 02/28/24.   Plan requires patient to trial and failure of 2 formulary drugs: estring  vaginal ring, premarin vaginal cream, vagifem  vaginal tablets.   Dr. Glennon -please advise on alternative.

## 2024-03-01 ENCOUNTER — Ambulatory Visit: Admitting: Primary Care

## 2024-03-01 ENCOUNTER — Encounter: Payer: Self-pay | Admitting: Primary Care

## 2024-03-03 ENCOUNTER — Ambulatory Visit: Payer: Self-pay | Admitting: Nurse Practitioner

## 2024-03-03 ENCOUNTER — Ambulatory Visit: Payer: Self-pay | Admitting: Urology

## 2024-03-03 DIAGNOSIS — N281 Cyst of kidney, acquired: Secondary | ICD-10-CM

## 2024-03-06 ENCOUNTER — Ambulatory Visit (HOSPITAL_BASED_OUTPATIENT_CLINIC_OR_DEPARTMENT_OTHER)

## 2024-03-07 NOTE — Telephone Encounter (Signed)
 MyChart message sent to patient.

## 2024-03-15 ENCOUNTER — Ambulatory Visit: Admitting: Urology

## 2024-03-15 VITALS — BP 107/74 | HR 87

## 2024-03-15 DIAGNOSIS — N2889 Other specified disorders of kidney and ureter: Secondary | ICD-10-CM | POA: Diagnosis not present

## 2024-03-15 DIAGNOSIS — R3912 Poor urinary stream: Secondary | ICD-10-CM

## 2024-03-15 LAB — URINALYSIS, ROUTINE W REFLEX MICROSCOPIC
Bilirubin, UA: NEGATIVE
Glucose, UA: NEGATIVE
Ketones, UA: NEGATIVE
Leukocytes,UA: NEGATIVE
Nitrite, UA: NEGATIVE
Protein,UA: NEGATIVE
RBC, UA: NEGATIVE
Specific Gravity, UA: 1.02 (ref 1.005–1.030)
Urobilinogen, Ur: 0.2 mg/dL (ref 0.2–1.0)
pH, UA: 6 (ref 5.0–7.5)

## 2024-03-15 MED ORDER — POLYETHYLENE GLYCOL 3350 17 G PO PACK
17.0000 g | PACK | Freq: Every day | ORAL | 11 refills | Status: DC
Start: 2024-03-15 — End: 2024-05-18

## 2024-03-15 NOTE — Progress Notes (Signed)
 03/15/2024 8:56 AM   [name] 06-May-1971 [id number]  Referring provider: Toribio Jerel MATSU, MD [address] [place],  [place] [address]  Right renal mass   HPI: Mary Pratt os a 43bn here for evaluation of a right renal mass. She underwent Ct 5/25 which showed a right 1.5cm simple renal cyst. She has issues with a weak urinary stream and starting/stopping of her urinary stream. She has nocturia 2x. She has issues with constipation and takes flax seeds.    PMH: Past Medical History:  Diagnosis Date   Abnormal pap 05/2012   ASCUS + HPV, 11/14 LGSIL   Allergy     Anemia    Anxiety    Asthma    Cardiac defibrillator in place    Chronic systolic CHF (congestive heart failure) (HCC)    a. 02/2015 Echo: EF 20%, sev dil w/ diff HK, worse @ inf base. Tiv AI, mild MR, mod dil LA, PASP .   Clotting disorder (HCC)    DVT of lower extremity (deep venous thrombosis) (HCC)    Functional dyspepsia    early satiety    GERD with stricture    dilated 2010   HELICOBACTER PYLORI GASTRITIS 01/15/2009   dyspnea, ? reaction to Biaxin/amoxicillin  Pylera 01/15/09 - completed therapy      IBS (irritable bowel syndrome)    NICM (nonischemic cardiomyopathy) (HCC)    a. 02/2015 Cath: nl cors, EF 15%, mild PAH;  b. 02/2015 Enrolled in VEST trial.   Sickle cell trait (HCC)    Situational depression    son died   Vitamin B 12 deficiency 11/2022    Surgical History: Past Surgical History:  Procedure Laterality Date   BIV ICD GENERATOR CHANGEOUT N/A 01/24/2021   Procedure: BIV ICD GENERATOR CHANGEOUT;  Surgeon: Fernande Elspeth BROCKS, MD;  Location: [facility] INVASIVE CV LAB;  Service: Cardiovascular;  Laterality: N/A;   CARDIAC CATHETERIZATION N/A 02/26/2015   Procedure: Right/Left Heart Cath and Coronary Angiography;  Surgeon: Debby DELENA Sor, MD;  Location: [facility] INVASIVE CV LAB;  Service: Cardiovascular;  Laterality: N/A;   COLPOSCOPY  09/08/2011   neg   EP IMPLANTABLE DEVICE N/A 10/14/2015   Procedure: BiV  ICD Insertion CRT-D;  Surgeon: Elspeth BROCKS Fernande, MD;  Location: [facility] INVASIVE CV LAB;  Service: Cardiovascular;  Laterality: N/A;   ESOPHAGOGASTRODUODENOSCOPY (EGD) WITH ESOPHAGEAL DILATION  11/30/2008   esophageal ring dilated 54 French, H. pylori gastritis   HARVEST BONE GRAFT  02/2023   TUBAL LIGATION  09/07/1989   WISDOM TOOTH EXTRACTION     WRIST SURGERY  2023    Home Medications:  Allergies as of 03/15/2024       Reactions   Clarithromycin Hives   Amoxicillin Palpitations   REACTION: Tacycardia   Meloxicam Rash   Spiriva Respimat [tiotropium Bromide Monohydrate] Rash   Symbicort  [budesonide -formoterol  Fumarate] Rash        Medication List        Accurate as of March 15, 2024  8:56 AM. If you have any questions, ask your nurse or doctor.          albuterol  108 (90 Base) MCG/ACT inhaler Commonly known as: VENTOLIN  HFA Inhale 1-2 puffs into the lungs every 4 (four) hours as needed.   amitriptyline  25 MG tablet Commonly known as: ELAVIL  Take 1 tablet (25 mg total) by mouth at bedtime.   apixaban  5 MG Tabs tablet Commonly known as: Eliquis  Take 1 tablet (5 mg total) by mouth 2 (two) times daily.  Start taking after completion of starter pack.   Azelastine  HCl 137 MCG/SPRAY Soln Place 2 sprays into both nostrils 2 (two) times daily.   Breo Ellipta 200-25 MCG/ACT Aepb Generic drug: fluticasone  furoate-vilanterol Inhale 1 puff into the lungs daily.   carvedilol  25 MG tablet Commonly known as: COREG  TAKE 1 TABLET (25 MG TOTAL) BY MOUTH TWICE A DAY WITH MEALS   clobetasol  ointment 0.05 % Commonly known as: TEMOVATE  Apply 1 Application topically See admin instructions. Apply to area with itching 3 times a day for a week then twice a day for 2 weeks then daily for 1 week then twice weekly for a week.  To use with only flares   dicyclomine  20 MG tablet Commonly known as: BENTYL  Take 1 tablet (20 mg total) by mouth 2 (two) times daily.   docusate sodium  100 MG  capsule Commonly known as: COLACE Take 1 capsule (100 mg total) by mouth every 12 (twelve) hours.   Emgality  120 MG/ML Soaj Generic drug: Galcanezumab -gnlm INJECT 240 MG INTO THE SKIN ONCE FOR 1 DOSE. LOADING DOSE What changed: See the new instructions.   Entresto  24-26 MG Generic drug: sacubitril -valsartan  TAKE 1 TABLET BY MOUTH TWICE A DAY   EPINEPHrine  0.3 mg/0.3 mL Soaj injection Commonly known as: EPI-PEN INJECT 0.3 MG INTO THE MUSCLE AS NEEDED FOR ANAPHYLAXIS.   estradiol  0.1 MG/GM vaginal cream Commonly known as: ESTRACE  VAGINAL Rub pea size amount each night for 3 weeks then 3 times a week thereafter when not using the clobetasol . When using the clobetasol , use it daily   fexofenadine 180 MG tablet Commonly known as: ALLEGRA Take 180 mg by mouth daily.   fluocinonide ointment 0.05 % Commonly known as: LIDEX Apply 1 Application topically daily as needed (eczema).   fluticasone  50 MCG/ACT nasal spray Commonly known as: FLONASE  Place 2 sprays into both nostrils every morning.   fluticasone -salmeterol 250-50 MCG/ACT Aepb Commonly known as: Advair Diskus Inhale 1 puff into the lungs in the morning.   furosemide  40 MG tablet Commonly known as: LASIX  Take 1 tablet (40 mg total) by mouth daily.   gabapentin 100 MG capsule Commonly known as: NEURONTIN Take 1 tablet by mouth at night as tolerated   nitroGLYCERIN  0.4 MG SL tablet Commonly known as: NITROSTAT  Place 1 tablet (0.4 mg total) under the tongue every 5 (five) minutes x 3 doses as needed for chest pain.   pantoprazole  40 MG tablet Commonly known as: PROTONIX  Take 40 mg by mouth daily.   Premarin  vaginal cream Generic drug: conjugated estrogens  Place 1 Applicatorful vaginally 3 (three) times a week.   sertraline  100 MG tablet Commonly known as: ZOLOFT  Take 100 mg by mouth daily.   spironolactone  25 MG tablet Commonly known as: ALDACTONE  TAKE 1 TABLET (25 MG TOTAL) BY MOUTH DAILY.   Veozah  45 MG  Tabs Generic drug: Fezolinetant  Take 1 tablet (45 mg total) by mouth daily.   VITAMIN B-12 IJ Inject as directed every 30 (thirty) days.   Vitamin D  (Ergocalciferol ) 1.25 MG (50000 UNIT) Caps capsule Commonly known as: DRISDOL Take 50,000 Units by mouth once a week.        Allergies:  Allergies  Allergen Reactions   Clarithromycin Hives   Amoxicillin Palpitations    REACTION: Tacycardia   Meloxicam Rash   Spiriva Respimat [Tiotropium Bromide Monohydrate] Rash   Symbicort  [Budesonide -Formoterol  Fumarate] Rash    Family History: Family History  Problem Relation Age of Onset   Diabetes Mother    Hypertension Mother  Hypothyroidism Mother    Prostate cancer Father 42   Breast cancer Maternal Aunt 54   Breast cancer Maternal Aunt 58   Breast cancer Maternal Aunt 65   Brain cancer Maternal Uncle 39   Brain cancer Maternal Uncle 73   Asthma Paternal Aunt    Prostate cancer Paternal Uncle 8   Prostate cancer Paternal Uncle 14   Prostate cancer Paternal Uncle 55   Lung cancer Maternal Grandmother 53   Eczema Daughter    Allergic rhinitis Daughter    Colon cancer Neg Hx    Colon polyps Neg Hx    Esophageal cancer Neg Hx     Social History:  reports that she has never smoked. She has never been exposed to tobacco smoke. She has never used smokeless tobacco. She reports current alcohol use. She reports that she does not use drugs.  ROS: All other review of systems were reviewed and are negative except what is noted above in HPI  Physical Exam: BP 107/74   Pulse 87   LMP 09/14/2022 (Approximate)   Constitutional:  Alert and oriented, No acute distress. HEENT:  AT, moist mucus membranes.  Trachea midline, no masses. Cardiovascular: No clubbing, cyanosis, or edema. Respiratory: Normal respiratory effort, no increased work of breathing. GI: Abdomen is soft, nontender, nondistended, no abdominal masses GU: No CVA tenderness.  Lymph: No cervical or inguinal  lymphadenopathy. Skin: No rashes, bruises or suspicious lesions. Neurologic: Grossly intact, no focal deficits, moving all 4 extremities. Psychiatric: Normal mood and affect.  Laboratory Data: Lab Results  Component Value Date   WBC 4.8 01/30/2024   HGB 11.7 (L) 01/30/2024   HCT 35.3 (L) 01/30/2024   MCV 82.1 01/30/2024   PLT 202 01/30/2024    Lab Results  Component Value Date   CREATININE 0.82 01/30/2024    No results found for: PSA  No results found for: TESTOSTERONE  Lab Results  Component Value Date   HGBA1C 5.8 (H) 06/24/2021    Urinalysis    Component Value Date/Time   COLORURINE STRAW (A) 01/30/2024 1645   APPEARANCEUR CLEAR 01/30/2024 1645   LABSPEC 1.021 01/30/2024 1645   PHURINE 7.0 01/30/2024 1645   GLUCOSEU NEGATIVE 01/30/2024 1645   HGBUR NEGATIVE 01/30/2024 1645   BILIRUBINUR NEGATIVE 01/30/2024 1645   BILIRUBINUR neg 08/16/2014 1512   KETONESUR NEGATIVE 01/30/2024 1645   PROTEINUR NEGATIVE 01/30/2024 1645   UROBILINOGEN negative 08/16/2014 1512   NITRITE NEGATIVE 01/30/2024 1645   LEUKOCYTESUR NEGATIVE 01/30/2024 1645    Lab Results  Component Value Date   BACTERIA FEW (A) 03/10/2023    Pertinent Imaging: Ct 01/30/2024: Images reviewed and discussed with the patient No results found for this or any previous visit.  No results found for this or any previous visit.  No results found for this or any previous visit.  No results found for this or any previous visit.  No results found for this or any previous visit.  No results found for this or any previous visit.  No results found for this or any previous visit.  No results found for this or any previous visit.   Assessment & Plan:    1. Right renal mass (Primary) We discussed the benign nature of renal cysts and the need for no further workup and no further surveillance.   2. Weak urinary stream  -we will start miralax  17g daily.    No follow-ups on file.  Mary Clara, MD  [facility] [place]

## 2024-03-19 ENCOUNTER — Other Ambulatory Visit: Payer: Self-pay | Admitting: Internal Medicine

## 2024-03-20 NOTE — Telephone Encounter (Signed)
 Rx sent.

## 2024-03-20 NOTE — Telephone Encounter (Signed)
 Please advise if 90 day supply is okay Sir, thank you.

## 2024-03-21 ENCOUNTER — Encounter: Payer: Self-pay | Admitting: Urology

## 2024-03-21 NOTE — Patient Instructions (Signed)
 A Growth in the Kidney (Renal Mass): What to Know  A renal mass is an abnormal growth in the kidney. It may be found during an MRI, CT scan, or ultrasound that's done to check for other problems in the belly. Some renal masses are cancerous, or malignant, and can grow or spread quickly. Others are benign, which means they're not cancer. Renal masses include: Tumors. These may be either cancerous or benign. The most common cancerous tumor in adults is called renal cell carcinoma. In children, the most common type is Wilms tumor. The most common kidney tumors that aren't cancer include renal adenomas, oncocytomas, and angiomyolipomas (AML). Cysts. These are pockets of fluid that form on or in the kidney. What are the causes? Certain types of cancers, infections, or injuries can cause a renal mass. It's not always known what causes a cyst to form in or on the kidney. What are the signs or symptoms? Often, a renal mass or kidney cyst doesn't cause any signs or symptoms. How is this diagnosed? Your health care provider may suggest tests to diagnose the cause of your renal mass. These tests may include: A physical exam. Blood tests. Pee (urine) tests. Imaging tests, such as: CT scan. MRI. Ultrasound. Chest X-ray or bone scan. These may be done if a tumor is cancerous to see if the cancer has spread outside the kidney. Biopsy. This is when a small piece of tissue is removed from the renal mass for testing. How is this treated? Treatment is not always needed for a renal mass. Treatment will depend on the cause of the mass and if it's causing any problems or symptoms. For a cancerous renal mass, treatment options may include: Surgery. This is done to remove the tumor and any affected tissue. Chemotherapy. This uses medicines to kill cancer cells. Radiation. High-energy X-rays or gamma rays are used to kill cancer cells. Ablation. This uses extreme hot or cold temperature to kill the cancer  cells. Immunotherapy. Medicines are used to help the body's defense system (immune system) fight the cancer cells. Taking part in clinical trials. This involves trying new or experimental treatments to see if they're effective. Most kidney cysts don't need to be treated. Follow these instructions at home: What you need to do at home will depend on the cause of the mass. Follow the instructions that your provider gives you. In general: Take medicines only as told. If you were given antibiotics, take them as told. Do not stop taking them even if you start to feel better. Follow any instructions from your provider about what you can and can't do. Do not smoke, vape, or use nicotine or tobacco. Keep all follow-up visits. Your provider will need to check if your renal mass has changed or grown. Contact a health care provider if: You have flank pain, which is pain in your side or back. You have a fever. You have a loss of appetite. You have pain or swelling in your belly. You lose weight. Get help right away if: Your pain gets worse. There's blood in your pee. You can't pee. This information is not intended to replace advice given to you by your health care provider. Make sure you discuss any questions you have with your health care provider. Document Revised: 02/19/2023 Document Reviewed: 02/19/2023 Elsevier Patient Education  2025 [employer].

## 2024-03-22 ENCOUNTER — Ambulatory Visit: Admitting: Urology

## 2024-03-23 ENCOUNTER — Telehealth: Payer: Self-pay | Admitting: Hematology

## 2024-03-23 ENCOUNTER — Encounter: Payer: Self-pay | Admitting: Hematology

## 2024-03-23 NOTE — Telephone Encounter (Signed)
 Anderson called in to schedule a follow up appointment for August after her blood work.

## 2024-03-29 NOTE — Telephone Encounter (Signed)
 Hey ladies, I cannot figure out why I am also seeing her?  I do not think I have seen her before.  Do you guys have any idea?  Delon Hope, NP 03/29/2024 3:47 PM

## 2024-03-30 ENCOUNTER — Other Ambulatory Visit (HOSPITAL_COMMUNITY): Payer: Self-pay

## 2024-03-30 ENCOUNTER — Telehealth: Payer: Self-pay | Admitting: Pharmacy Technician

## 2024-03-30 NOTE — Telephone Encounter (Signed)
 Pharmacy Patient Advocate Encounter   Received notification from CoverMyMeds that prior authorization for EMGALITY  120MG  is required/requested.   Insurance verification completed.   The patient is insured through [facility] .   Per test claim: PA required; PA started via CoverMyMeds. KEY [id number] . Waiting for clinical questions to populate.

## 2024-04-03 ENCOUNTER — Telehealth: Payer: Self-pay | Admitting: Internal Medicine

## 2024-04-03 NOTE — Telephone Encounter (Signed)
 Patient c/o Palpitations: STAT if patient c/o lightheadedness, shortness of breath, or chest pain  How long have you had palpitations/irregular HR/ Afib? Are you having the symptoms now?  Fluttering for the past week. Mainly when laying down. No symptoms currently.  Are you currently experiencing lightheadedness, SOB or CP?  No   Do you have a history of afib (atrial fibrillation) or irregular heart rhythm?  No   Have you checked your BP or HR? (document readings if available):  No   Are you experiencing any other symptoms?  No, but device has been flashing light for the past week.

## 2024-04-03 NOTE — Telephone Encounter (Signed)
 Remote transmission received. Normal device function. No episodes noted. Effective BiV pacing 97.1%. Lead trends are stable.   RN from clinic will call pt with update 04/04/24. Also, light on monitor pt can disregard as the remote was sent successfully.

## 2024-04-03 NOTE — Telephone Encounter (Signed)
 Left message - this message has been sent to Device clinic pool   To assist the patient

## 2024-04-04 NOTE — Telephone Encounter (Signed)
 Sent to  [place] provider to review.

## 2024-04-04 NOTE — Telephone Encounter (Signed)
 Called patient to provide update on remote check and monitor. Patient was appreciative of call back.   Patient does report intermittent palpitations when lying down. No other symptoms associated.   Advised patient I will forward to triage to advise further considering device does not reflect any episodes.  Pt voiced understanding and thankful for c/b.

## 2024-04-05 NOTE — Telephone Encounter (Signed)
 Patient notified and verbalized understanding.

## 2024-04-10 ENCOUNTER — Other Ambulatory Visit (HOSPITAL_COMMUNITY)
Admission: RE | Admit: 2024-04-10 | Discharge: 2024-04-10 | Disposition: A | Discharge status: Home / Self Care | Source: Ambulatory Visit | Attending: Oncology | Admitting: Oncology

## 2024-04-10 ENCOUNTER — Other Ambulatory Visit (HOSPITAL_COMMUNITY)
Admission: RE | Admit: 2024-04-10 | Discharge: 2024-04-10 | Disposition: A | Discharge status: Home / Self Care | Source: Ambulatory Visit | Attending: Hematology | Admitting: Hematology

## 2024-04-10 DIAGNOSIS — I82492 Acute embolism and thrombosis of other specified deep vein of left lower extremity: Secondary | ICD-10-CM | POA: Insufficient documentation

## 2024-04-10 DIAGNOSIS — I8290 Acute embolism and thrombosis of unspecified vein: Secondary | ICD-10-CM | POA: Diagnosis present

## 2024-04-10 LAB — D-DIMER, QUANTITATIVE: D-Dimer, Quant: 1.64 ug{FEU}/mL — ABNORMAL HIGH (ref 0.00–0.50)

## 2024-04-12 ENCOUNTER — Ambulatory Visit (HOSPITAL_COMMUNITY)
Admission: RE | Admit: 2024-04-12 | Discharge: 2024-04-12 | Disposition: A | Discharge status: Home / Self Care | Source: Ambulatory Visit | Attending: Obstetrics and Gynecology | Admitting: Obstetrics and Gynecology

## 2024-04-12 DIAGNOSIS — L819 Disorder of pigmentation, unspecified: Secondary | ICD-10-CM | POA: Insufficient documentation

## 2024-04-12 DIAGNOSIS — E2839 Other primary ovarian failure: Secondary | ICD-10-CM | POA: Diagnosis present

## 2024-04-12 LAB — LUPUS ANTICOAGULANT PANEL
DRVVT: 47.5 s — ABNORMAL HIGH (ref 0.0–47.0)
PTT Lupus Anticoagulant: 41.2 s (ref 0.0–43.5)

## 2024-04-12 LAB — DRVVT CONFIRM: dRVVT Confirm: 1.2 ratio (ref 0.8–1.2)

## 2024-04-12 LAB — DRVVT MIX: dRVVT Mix: 41.9 s — ABNORMAL HIGH (ref 0.0–40.4)

## 2024-04-14 ENCOUNTER — Other Ambulatory Visit (HOSPITAL_COMMUNITY)

## 2024-04-14 ENCOUNTER — Inpatient Hospital Stay: Attending: Oncology

## 2024-04-18 ENCOUNTER — Other Ambulatory Visit: Payer: Self-pay

## 2024-04-20 ENCOUNTER — Inpatient Hospital Stay: Admitting: Oncology

## 2024-04-21 ENCOUNTER — Other Ambulatory Visit: Payer: Self-pay

## 2024-04-21 ENCOUNTER — Ambulatory Visit (INDEPENDENT_AMBULATORY_CARE_PROVIDER_SITE_OTHER): Payer: BC Managed Care – PPO

## 2024-04-21 DIAGNOSIS — I428 Other cardiomyopathies: Secondary | ICD-10-CM

## 2024-04-21 DIAGNOSIS — I82442 Acute embolism and thrombosis of left tibial vein: Secondary | ICD-10-CM

## 2024-04-21 DIAGNOSIS — I82492 Acute embolism and thrombosis of other specified deep vein of left lower extremity: Secondary | ICD-10-CM

## 2024-04-23 NOTE — Assessment & Plan Note (Signed)
 Previous DVT in the left leg has resolved, confirmed by a recent ultrasound showing no blood clots yesterday. Currently on Eliquis , planned to continue until early August, completing a six-month course from the initial diagnosis on February 16. - Cancel the previously scheduled ultrasound for August 8, as the recent ultrasound confirmed resolution of DVT. - Schedule lab work for August 8 after stopping Eliquis  for at least a week. - Coordinate follow-up with Dr. Davonna after lab work in August. - Since lab work done off of Eliquis  showed elevated D-dimer at 1.64, DRVVT mix positive at 41.9.  DRVVT confirmatory test was negative.  Hexagonal phase phospholipid support elevated at 12.  PTT LA mix also positive.  PTT LA confirmatory test was negative. -Restart Eliquis  5 mg twice daily.  Bilateral lower extremity Doppler ordered to evaluate for new DVT.  Autoimmune screening labs ordered as part of today's visit.  Referral to [facility] hematology per patient request provided today.  -Plan for follow-up with patient after labs and ultrasound results are available.

## 2024-04-23 NOTE — Progress Notes (Unsigned)
 Patient Care Team: Toribio Jerel MATSU, MD as PCP - General (Family Medicine) Alvan Dorn FALCON, MD as PCP - Cardiology (Cardiology) Fernande Elspeth BROCKS, MD as PCP - Electrophysiology (Cardiology) Skeet Juliene SAUNDERS, DO as Consulting Physician (Neurology) Lanny Callander, MD as Consulting Physician (Hematology and Oncology) Hope Almarie ORN, NP as Nurse Practitioner (Pulmonary Disease)  Clinic Day:  04/24/2024  Referring physician: Toribio Jerel MATSU, MD  ASSESSMENT & PLAN:   Assessment & Plan: DVT (deep venous thrombosis) (HCC) Previous DVT in the left leg has resolved, confirmed by a recent ultrasound showing no blood clots yesterday. Currently on Eliquis , planned to continue until early August, completing a six-month course from the initial diagnosis on February 16. - Cancel the previously scheduled ultrasound for August 8, as the recent ultrasound confirmed resolution of DVT. - Schedule lab work for August 8 after stopping Eliquis  for at least a week. - Coordinate follow-up with Dr. Davonna after lab work in August. - Since lab work done off of Eliquis  showed elevated D-dimer at 1.64, DRVVT mix positive at 41.9.  DRVVT confirmatory test was negative.  Hexagonal phase phospholipid support elevated at 12.  PTT LA mix also positive.  PTT LA confirmatory test was negative. -Restart Eliquis  5 mg twice daily.  Bilateral lower extremity Doppler ordered to evaluate for new DVT.  Autoimmune screening labs ordered as part of today's visit.  Referral to [facility] hematology per patient request provided today.  -Plan for follow-up with patient after labs and ultrasound results are available.     History of DVT Has been off Eliquis  for approximately 1 month.  Had hypercoagulable labs done prior to today's visit.  Mixing studies were positive for lupus anticoagulant however, confirmatory tests were negative.  Specifically, DRVVT mix was positive at 41.9.  DRVVT confirm testing was negative.  Hexagonal phase phospholipid is  slightly elevated at 12.  PTT LA makes was positive.  PTT LA confirm was negative.  D-dimer was elevated at 1.64.  Previous testing for factor V Leiden and other genetic disorders causing increased propensity for blood clots were negative.  She is having bilateral leg pain again.  Mild swelling is also present.  Symptoms are similar to those that she had when initially diagnosed with DVT.  Will restart Eliquis  5 mg twice daily.  Will get bilateral lower extremity Doppler for further evaluation.  Will also add additional labs for autoimmune disorders which may increase risk of clots.  Patient has asked for referral to [facility] hematology for second opinion.  Will provide that referral today.  Plan Reviewed labs. - CBC and CMP unremarkable. - Anticoagulation studies were reviewed with unclear results. Restart Eliquis  5 mg twice daily. Repeat bilateral lower extremity Doppler to evaluate for DVT. Labs for autoimmune disorders will be drawn before she leaves today. Refer to [facility] hematology for second opinion. Plan to follow-up with patient as labs and ultrasound results are available.  The patient understands the plans discussed today and is in agreement with them.  She knows to contact our office if she develops concerns prior to her next appointment.  I provided 30 minutes of face-to-face time during this encounter and > 50% was spent counseling as documented under my assessment and plan.    Powell FORBES Lessen, NP  [facility] - A DEPT OF [place] [address] [place] [address] Dept: [phone] Dept Fax: [phone]   Orders Placed This Encounter  Procedures  ANA, IFA (with reflex)    Standing Status:   Future    Number of Occurrences:   1    Expected Date:   04/24/2024    Expiration Date:   07/23/2024   Rheumatoid factor    Standing Status:   Future    Number of Occurrences:   1    Expected Date:   04/24/2024     Expiration Date:   07/23/2024   Sedimentation rate    Standing Status:   Future    Number of Occurrences:   1    Expected Date:   04/24/2024    Expiration Date:   07/23/2024   Ambulatory referral to Hematology / Oncology    Referral Priority:   Routine    Referral Type:   Consultation    Referral Reason:   Patient Preference    Requested Specialty:   Hematology    Number of Visits Requested:   1      CHIEF COMPLAINT:  CC: History of DVT  Current Treatment: Eliquis  5 mg twice daily  INTERVAL HISTORY:  Mary Pratt is here today for repeat clinical assessment.  She last saw Dr. Lanny on 12/29/2023.  Left Doppler was negative for DVT, indicating resolution.  She has been off Eliquis  for approximately 6 weeks in order to have anticoagulation labs done.  She has started to develop tenderness and mild swelling in both lower legs.  This is more severe on the left.  Pain is mostly in the back of the calf.  Hurts more with exertion.  She is propping her legs up injuring plenty of water.  She is concerned about development of new clots while off Eliquis .  She denies chest pain, chest pressure, or shortness of breath. She denies headaches or visual disturbances. She denies abdominal pain, nausea, vomiting, or changes in bowel or bladder habits.   She denies fevers or chills. She denies pain. Her appetite is good. Her weight has been stable.  I have reviewed the past medical history, past surgical history, social history and family history with the patient and they are unchanged from previous note.  ALLERGIES:  is allergic to clarithromycin, amoxicillin, meloxicam, spiriva respimat [tiotropium bromide monohydrate], and symbicort  [budesonide -formoterol  fumarate].  MEDICATIONS:  Current Outpatient Medications  Medication Sig Dispense Refill   albuterol  (VENTOLIN  HFA) 108 (90 Base) MCG/ACT inhaler Inhale 1-2 puffs into the lungs every 4 (four) hours as needed. 18 g 1   amitriptyline  (ELAVIL ) 25 MG tablet TAKE  1 TABLET BY MOUTH EVERYDAY AT BEDTIME 90 tablet 2   apixaban  (ELIQUIS ) 5 MG TABS tablet Take 1 tablet (5 mg total) by mouth 2 (two) times daily. Start taking after completion of starter pack. 60 tablet 4   Azelastine  HCl 137 MCG/SPRAY SOLN Place 2 sprays into both nostrils 2 (two) times daily.     BREO ELLIPTA 200-25 MCG/ACT AEPB Inhale 1 puff into the lungs daily. (Patient not taking: Reported on 02/23/2024)     carvedilol  (COREG ) 25 MG tablet TAKE 1 TABLET (25 MG TOTAL) BY MOUTH TWICE A DAY WITH MEALS 180 tablet 1   clobetasol  ointment (TEMOVATE ) 0.05 % Apply 1 Application topically See admin instructions. Apply to area with itching 3 times a day for a week then twice a day for 2 weeks then daily for 1 week then twice weekly for a week.  To use with only flares 30 g 1   conjugated estrogens  (PREMARIN ) vaginal cream Place 1 Applicatorful vaginally 3 (three) times a week.  42.5 g 0   Cyanocobalamin  (VITAMIN B-12 IJ) Inject as directed every 30 (thirty) days.     dicyclomine  (BENTYL ) 20 MG tablet Take 1 tablet (20 mg total) by mouth 2 (two) times daily. (Patient not taking: Reported on 02/23/2024) 20 tablet 0   docusate sodium  (COLACE) 100 MG capsule Take 1 capsule (100 mg total) by mouth every 12 (twelve) hours. 30 capsule 0   EPINEPHRINE  0.3 mg/0.3 mL IJ SOAJ injection INJECT 0.3 MG INTO THE MUSCLE AS NEEDED FOR ANAPHYLAXIS. 2 each 1   estradiol  (ESTRACE  VAGINAL) 0.1 MG/GM vaginal cream Rub pea size amount each night for 3 weeks then 3 times a week thereafter when not using the clobetasol . When using the clobetasol , use it daily 42.5 g 12   fexofenadine (ALLEGRA) 180 MG tablet Take 180 mg by mouth daily.     Fezolinetant  (VEOZAH ) 45 MG TABS Take 1 tablet (45 mg total) by mouth daily. 30 tablet 2   fluocinonide ointment (LIDEX) 0.05 % Apply 1 Application topically daily as needed (eczema).     fluticasone  (FLONASE ) 50 MCG/ACT nasal spray Place 2 sprays into both nostrils every morning. 48 g 1    fluticasone -salmeterol (ADVAIR DISKUS) 250-50 MCG/ACT AEPB Inhale 1 puff into the lungs in the morning. 60 each 2   furosemide  (LASIX ) 40 MG tablet Take 1 tablet (40 mg total) by mouth daily. 30 tablet 5   gabapentin (NEURONTIN) 100 MG capsule Take 1 tablet by mouth at night as tolerated     Galcanezumab -gnlm (EMGALITY ) 120 MG/ML SOAJ INJECT 240 MG INTO THE SKIN ONCE FOR 1 DOSE. LOADING DOSE (Patient taking differently: Inject 240 mg into the skin every 30 (thirty) days.) 1 mL 1   nitroGLYCERIN  (NITROSTAT ) 0.4 MG SL tablet Place 1 tablet (0.4 mg total) under the tongue every 5 (five) minutes x 3 doses as needed for chest pain. 25 tablet 12   pantoprazole  (PROTONIX ) 40 MG tablet Take 40 mg by mouth daily.     polyethylene glycol (MIRALAX ) 17 g packet Take 17 g by mouth daily. 30 each 11   sacubitril -valsartan  (ENTRESTO ) 24-26 MG TAKE 1 TABLET BY MOUTH TWICE A DAY 60 tablet 6   sertraline  (ZOLOFT ) 100 MG tablet Take 100 mg by mouth daily.     spironolactone  (ALDACTONE ) 25 MG tablet TAKE 1 TABLET (25 MG TOTAL) BY MOUTH DAILY. 90 tablet 1   Vitamin D , Ergocalciferol , (DRISDOL) 1.25 MG (50000 UNIT) CAPS capsule Take 50,000 Units by mouth once a week.     No current facility-administered medications for this visit.    HISTORY OF PRESENT ILLNESS:   Oncology History   No history exists.      REVIEW OF SYSTEMS:   Constitutional: Denies fevers, chills or abnormal weight loss Eyes: Denies blurriness of vision Ears, nose, mouth, throat, and face: Denies mucositis or sore throat Respiratory: Denies cough, dyspnea or wheezes Cardiovascular: Denies palpitation or chest discomfort. She does have lower extremity tenderness and mild swelling.  Gastrointestinal:  Denies nausea, heartburn or change in bowel habits Skin: Denies abnormal skin rashes Lymphatics: Denies new lymphadenopathy or easy bruising Neurological:Denies numbness, tingling or new weaknesses Behavioral/Psych: Mood is stable, no new  changes  All other systems were reviewed with the patient and are negative.   VITALS:   Today's Vitals   04/24/24 1311  BP: 130/80  Pulse: 73  Resp: 17  Temp: 97.8 F (36.6 C)  SpO2: 96%  Weight: 168 lb 12.8 oz (76.6 kg)   Body mass  index is 31.89 kg/m.   Wt Readings from Last 3 Encounters:  04/24/24 168 lb 12.8 oz (76.6 kg)  02/23/24 167 lb (75.8 kg)  02/16/24 167 lb 12.8 oz (76.1 kg)    Body mass index is 31.89 kg/m.  Performance status (ECOG): 1 - Symptomatic but completely ambulatory  PHYSICAL EXAM:   GENERAL:alert, no distress and comfortable SKIN: skin color, texture, turgor are normal, no rashes or significant lesions EYES: normal, Conjunctiva are pink and non-injected, sclera clear OROPHARYNX:no exudate, no erythema and lips, buccal mucosa, and tongue normal  NECK: supple, thyroid  normal size, non-tender, without nodularity LYMPH:  no palpable lymphadenopathy in the cervical, axillary or inguinal LUNGS: clear to auscultation and percussion with normal breathing effort HEART: regular rate & rhythm and no murmurs. She has mild tenderness with palpation of lower legs. There is non pitting edema present in both ankles. Homan's sign is - ABDOMEN:abdomen soft, non-tender and normal bowel sounds Musculoskeletal:no cyanosis of digits and no clubbing  NEURO: alert & oriented x 3 with fluent speech, no focal motor/sensory deficits  LABORATORY DATA:  I have reviewed the data as listed    Component Value Date/Time   NA 139 04/24/2024 1250   NA 139 11/10/2022 1409   K 3.8 04/24/2024 1250   CL 104 04/24/2024 1250   CO2 27 04/24/2024 1250   GLUCOSE 113 (H) 04/24/2024 1250   BUN 13 04/24/2024 1250   BUN 10 11/10/2022 1409   CREATININE 0.95 04/24/2024 1250   CREATININE 0.91 01/22/2016 1308   CALCIUM 9.2 04/24/2024 1250   PROT 7.4 04/24/2024 1250   ALBUMIN 4.3 04/24/2024 1250   AST 21 04/24/2024 1250   ALT 23 04/24/2024 1250   ALKPHOS 79 04/24/2024 1250   BILITOT  0.4 04/24/2024 1250   GFRNONAA >60 04/24/2024 1250   GFRAA 87 12/15/2019 1602   Lab Results  Component Value Date   WBC 5.2 04/24/2024   NEUTROABS 2.7 04/24/2024   HGB 12.1 04/24/2024   HCT 36.3 04/24/2024   MCV 79.4 (L) 04/24/2024   PLT 225 04/24/2024    RADIOGRAPHIC STUDIES: CUP PACEART REMOTE DEVICE CHECK Result Date: 04/25/2024 BIV ICD Scheduled remote reviewed. Normal device function.  Presenting rhythm: AS/BIV paced No events since last reported on 7/28. Next remote 91 days. [place], CVRS  DG BONE DENSITY (DXA) Result Date: 04/13/2024 EXAM: DUAL X-RAY ABSORPTIOMETRY (DXA) FOR BONE MINERAL DENSITY 04/12/2024 11:32 am CLINICAL DATA:  53 year old Female Postmenopausal. TECHNIQUE: An axial (e.g., hips, spine) and/or appendicular (e.g., radius) exam was performed, as appropriate, using GE Psychologist, sport and exercise at [facility]. Images are obtained for bone mineral density measurement and are not obtained for diagnostic purposes. [id number] Exclusions: Lumbar spine due to advanced degenerative changes. COMPARISON:  None. FINDINGS: Scan quality: Good. LEFT FEMORAL NECK: BMD (in g/cm2): 0.967 T-score: -0.5 Z-score: -0.4 LEFT TOTAL HIP: BMD (in g/cm2): 0.965 T-score: -0.3 Z-score: -0.7 RIGHT FEMORAL NECK: BMD (in g/cm2): 0.903 T-score: -1.0 Z-score: -0.9 RIGHT TOTAL HIP: BMD (in g/cm2): 0.971 T-score: -0.3 Z-score: -0.6 LEFT FOREARM (RADIUS 33%): BMD (in g/cm2): 0.710 T-score: 0.1 Z-score: -0.2 FRAX 10-YEAR PROBABILITY OF FRACTURE: FRAX not reported as the lowest BMD is not in the osteopenia range. IMPRESSION: Normal based on BMD. Fracture risk is not increased. RECOMMENDATIONS: 1. All patients should optimize calcium and vitamin D  intake. 2. Consider FDA-approved medical therapies in postmenopausal women and men aged 51 years and older, based on the following: - A hip or vertebral (clinical or morphometric) fracture -  T-score less than or equal to -2.5 and secondary causes have been excluded. -  Low bone mass (T-score between -1.0 and -2.5) and a 10-year probability of a hip fracture greater than or equal to 3% or a 10-year probability of a major osteoporosis-related fracture greater than or equal to 20% based on the [place] -adapted WHO algorithm. - Clinician judgment and/or patient preferences may indicate treatment for people with 10-year fracture probabilities above or below these levels 3. Patients with diagnosis of osteoporosis or at high risk for fracture should have regular bone mineral density tests. For patients eligible for Medicare, routine testing is allowed once every 2 years. The testing frequency can be increased to one year for patients who have rapidly progressing disease, those who are receiving or discontinuing medical therapy to restore bone mass, or have additional risk factors. Electronically Signed   By: Reyes Phi M.D.   On: 04/13/2024 16:19

## 2024-04-24 ENCOUNTER — Inpatient Hospital Stay

## 2024-04-24 ENCOUNTER — Inpatient Hospital Stay: Attending: Oncology | Admitting: Nurse Practitioner

## 2024-04-24 VITALS — BP 130/80 | HR 73 | Temp 97.8°F | Resp 17 | Wt 168.8 lb

## 2024-04-24 DIAGNOSIS — R7989 Other specified abnormal findings of blood chemistry: Secondary | ICD-10-CM | POA: Insufficient documentation

## 2024-04-24 DIAGNOSIS — M79604 Pain in right leg: Secondary | ICD-10-CM | POA: Insufficient documentation

## 2024-04-24 DIAGNOSIS — D6862 Lupus anticoagulant syndrome: Secondary | ICD-10-CM | POA: Insufficient documentation

## 2024-04-24 DIAGNOSIS — Z86718 Personal history of other venous thrombosis and embolism: Secondary | ICD-10-CM | POA: Diagnosis present

## 2024-04-24 DIAGNOSIS — I82492 Acute embolism and thrombosis of other specified deep vein of left lower extremity: Secondary | ICD-10-CM

## 2024-04-24 DIAGNOSIS — M79605 Pain in left leg: Secondary | ICD-10-CM | POA: Diagnosis not present

## 2024-04-24 DIAGNOSIS — Z7901 Long term (current) use of anticoagulants: Secondary | ICD-10-CM | POA: Diagnosis not present

## 2024-04-24 DIAGNOSIS — I82442 Acute embolism and thrombosis of left tibial vein: Secondary | ICD-10-CM

## 2024-04-24 LAB — CBC WITH DIFFERENTIAL (CANCER CENTER ONLY)
Abs Immature Granulocytes: 0.01 K/uL (ref 0.00–0.07)
Basophils Absolute: 0 K/uL (ref 0.0–0.1)
Basophils Relative: 1 %
Eosinophils Absolute: 0.2 K/uL (ref 0.0–0.5)
Eosinophils Relative: 4 %
HCT: 36.3 % (ref 36.0–46.0)
Hemoglobin: 12.1 g/dL (ref 12.0–15.0)
Immature Granulocytes: 0 %
Lymphocytes Relative: 34 %
Lymphs Abs: 1.8 K/uL (ref 0.7–4.0)
MCH: 26.5 pg (ref 26.0–34.0)
MCHC: 33.3 g/dL (ref 30.0–36.0)
MCV: 79.4 fL — ABNORMAL LOW (ref 80.0–100.0)
Monocytes Absolute: 0.4 K/uL (ref 0.1–1.0)
Monocytes Relative: 9 %
Neutro Abs: 2.7 K/uL (ref 1.7–7.7)
Neutrophils Relative %: 52 %
Platelet Count: 225 K/uL (ref 150–400)
RBC: 4.57 MIL/uL (ref 3.87–5.11)
RDW: 14.6 % (ref 11.5–15.5)
WBC Count: 5.2 K/uL (ref 4.0–10.5)
nRBC: 0 % (ref 0.0–0.2)

## 2024-04-24 LAB — CMP (CANCER CENTER ONLY)
ALT: 23 U/L (ref 0–44)
AST: 21 U/L (ref 15–41)
Albumin: 4.3 g/dL (ref 3.5–5.0)
Alkaline Phosphatase: 79 U/L (ref 38–126)
Anion gap: 8 (ref 5–15)
BUN: 13 mg/dL (ref 6–20)
CO2: 27 mmol/L (ref 22–32)
Calcium: 9.2 mg/dL (ref 8.9–10.3)
Chloride: 104 mmol/L (ref 98–111)
Creatinine: 0.95 mg/dL (ref 0.44–1.00)
GFR, Estimated: >60 mL/min (ref >60)
Glucose, Bld: 113 mg/dL — ABNORMAL HIGH (ref 70–99)
Potassium: 3.8 mmol/L (ref 3.5–5.1)
Sodium: 139 mmol/L (ref 135–145)
Total Bilirubin: 0.4 mg/dL (ref 0.0–1.2)
Total Protein: 7.4 g/dL (ref 6.5–8.1)

## 2024-04-24 LAB — SEDIMENTATION RATE: Sed Rate: 23 mm/h — ABNORMAL HIGH (ref 0–22)

## 2024-04-25 ENCOUNTER — Other Ambulatory Visit: Payer: Self-pay

## 2024-04-25 LAB — CUP PACEART REMOTE DEVICE CHECK
Battery Remaining Longevity: 22 mo
Battery Voltage: 2.91 V
Brady Statistic AP VP Percent: 0.11 %
Brady Statistic AP VS Percent: 0.01 %
Brady Statistic AS VP Percent: 99.84 %
Brady Statistic AS VS Percent: 0.03 %
Brady Statistic RA Percent Paced: 0.13 %
Brady Statistic RV Percent Paced: 99.11 %
Date Time Interrogation Session: 20250815015227
HighPow Impedance: 70 Ohm
Implantable Lead Connection Status: 753985
Implantable Lead Connection Status: 753985
Implantable Lead Connection Status: 753985
Implantable Lead Implant Date: 20170206
Implantable Lead Implant Date: 20170206
Implantable Lead Implant Date: 20170206
Implantable Lead Location: 753858
Implantable Lead Location: 753859
Implantable Lead Location: 753860
Implantable Lead Model: 4398
Implantable Lead Model: 5076
Implantable Pulse Generator Implant Date: 20220520
Lead Channel Impedance Value: 1121 Ohm
Lead Channel Impedance Value: 1140 Ohm
Lead Channel Impedance Value: 1140 Ohm
Lead Channel Impedance Value: 1292 Ohm
Lead Channel Impedance Value: 1311 Ohm
Lead Channel Impedance Value: 286.508
Lead Channel Impedance Value: 286.508
Lead Channel Impedance Value: 292.308
Lead Channel Impedance Value: 361 Ohm
Lead Channel Impedance Value: 370.256
Lead Channel Impedance Value: 418 Ohm
Lead Channel Impedance Value: 475 Ohm
Lead Channel Impedance Value: 589 Ohm
Lead Channel Impedance Value: 608 Ohm
Lead Channel Impedance Value: 722 Ohm
Lead Channel Impedance Value: 722 Ohm
Lead Channel Impedance Value: 760 Ohm
Lead Channel Impedance Value: 950 Ohm
Lead Channel Pacing Threshold Amplitude: 0.5 V
Lead Channel Pacing Threshold Amplitude: 0.625 V
Lead Channel Pacing Threshold Amplitude: 3.5 V
Lead Channel Pacing Threshold Pulse Width: 0.4 ms
Lead Channel Pacing Threshold Pulse Width: 0.4 ms
Lead Channel Pacing Threshold Pulse Width: 1 ms
Lead Channel Sensing Intrinsic Amplitude: 0.625 mV
Lead Channel Sensing Intrinsic Amplitude: 0.625 mV
Lead Channel Sensing Intrinsic Amplitude: 9.875 mV
Lead Channel Sensing Intrinsic Amplitude: 9.875 mV
Lead Channel Setting Pacing Amplitude: 1.5 V
Lead Channel Setting Pacing Amplitude: 2 V
Lead Channel Setting Pacing Amplitude: 3.75 V
Lead Channel Setting Pacing Pulse Width: 0.4 ms
Lead Channel Setting Pacing Pulse Width: 1 ms
Lead Channel Setting Sensing Sensitivity: 0.3 mV
Zone Setting Status: 755011

## 2024-04-25 LAB — RHEUMATOID FACTOR: Rheumatoid fact SerPl-aCnc: 13.1 [IU]/mL (ref <14.0)

## 2024-04-26 ENCOUNTER — Ambulatory Visit: Payer: Self-pay | Admitting: Nurse Practitioner

## 2024-04-26 LAB — ANTINUCLEAR ANTIBODIES, IFA: ANA Ab, IFA: NEGATIVE

## 2024-04-27 ENCOUNTER — Ambulatory Visit: Payer: Self-pay | Admitting: Cardiovascular Disease

## 2024-04-27 ENCOUNTER — Encounter: Payer: Self-pay | Admitting: Cardiology

## 2024-04-28 ENCOUNTER — Encounter: Payer: Self-pay | Admitting: Nurse Practitioner

## 2024-04-28 ENCOUNTER — Other Ambulatory Visit: Payer: Self-pay | Admitting: Nurse Practitioner

## 2024-04-28 ENCOUNTER — Telehealth: Payer: Self-pay

## 2024-04-28 ENCOUNTER — Other Ambulatory Visit: Payer: Self-pay

## 2024-04-28 DIAGNOSIS — I82492 Acute embolism and thrombosis of other specified deep vein of left lower extremity: Secondary | ICD-10-CM

## 2024-04-28 NOTE — Telephone Encounter (Signed)
 Faxed Powell Lessen, DNP office visit note, labs, referral, demo/insurance to [facility] F# [phone] T# [phone]. Confirmation received.

## 2024-04-29 ENCOUNTER — Emergency Department (HOSPITAL_COMMUNITY): Admission: EM | Admit: 2024-04-29 | Discharge: 2024-04-29 | Disposition: A | Discharge status: Home / Self Care

## 2024-04-29 ENCOUNTER — Emergency Department (HOSPITAL_COMMUNITY)

## 2024-04-29 ENCOUNTER — Encounter (HOSPITAL_COMMUNITY): Payer: Self-pay

## 2024-04-29 ENCOUNTER — Encounter: Payer: Self-pay | Admitting: Nurse Practitioner

## 2024-04-29 ENCOUNTER — Other Ambulatory Visit: Payer: Self-pay

## 2024-04-29 DIAGNOSIS — R109 Unspecified abdominal pain: Secondary | ICD-10-CM | POA: Insufficient documentation

## 2024-04-29 DIAGNOSIS — Z7901 Long term (current) use of anticoagulants: Secondary | ICD-10-CM | POA: Diagnosis not present

## 2024-04-29 DIAGNOSIS — R600 Localized edema: Secondary | ICD-10-CM | POA: Insufficient documentation

## 2024-04-29 DIAGNOSIS — R079 Chest pain, unspecified: Secondary | ICD-10-CM

## 2024-04-29 DIAGNOSIS — I5021 Acute systolic (congestive) heart failure: Secondary | ICD-10-CM | POA: Insufficient documentation

## 2024-04-29 DIAGNOSIS — J45909 Unspecified asthma, uncomplicated: Secondary | ICD-10-CM | POA: Insufficient documentation

## 2024-04-29 LAB — BASIC METABOLIC PANEL WITH GFR
Anion gap: 11 (ref 5–15)
BUN: 12 mg/dL (ref 6–20)
CO2: 25 mmol/L (ref 22–32)
Calcium: 9.7 mg/dL (ref 8.9–10.3)
Chloride: 99 mmol/L (ref 98–111)
Creatinine, Ser: 1.09 mg/dL — ABNORMAL HIGH (ref 0.44–1.00)
GFR, Estimated: >60 mL/min (ref >60)
Glucose, Bld: 102 mg/dL — ABNORMAL HIGH (ref 70–99)
Potassium: 4.1 mmol/L (ref 3.5–5.1)
Sodium: 135 mmol/L (ref 135–145)

## 2024-04-29 LAB — CBC
HCT: 37.4 % (ref 36.0–46.0)
Hemoglobin: 12.3 g/dL (ref 12.0–15.0)
MCH: 26.8 pg (ref 26.0–34.0)
MCHC: 32.9 g/dL (ref 30.0–36.0)
MCV: 81.5 fL (ref 80.0–100.0)
Platelets: 204 K/uL (ref 150–400)
RBC: 4.59 MIL/uL (ref 3.87–5.11)
RDW: 14.4 % (ref 11.5–15.5)
WBC: 5.8 K/uL (ref 4.0–10.5)
nRBC: 0 % (ref 0.0–0.2)

## 2024-04-29 LAB — HEPATIC FUNCTION PANEL
ALT: 29 U/L (ref 0–44)
AST: 31 U/L (ref 15–41)
Albumin: 3.8 g/dL (ref 3.5–5.0)
Alkaline Phosphatase: 78 U/L (ref 38–126)
Bilirubin, Direct: 0.1 mg/dL (ref 0.0–0.2)
Indirect Bilirubin: 0.6 mg/dL (ref 0.3–0.9)
Total Bilirubin: 0.7 mg/dL (ref 0.0–1.2)
Total Protein: 7.5 g/dL (ref 6.5–8.1)

## 2024-04-29 LAB — BRAIN NATRIURETIC PEPTIDE: B Natriuretic Peptide: 4 pg/mL (ref 0.0–100.0)

## 2024-04-29 LAB — LIPASE, BLOOD: Lipase: 29 U/L (ref 11–51)

## 2024-04-29 LAB — TROPONIN I (HIGH SENSITIVITY): Troponin I (High Sensitivity): 4 ng/L (ref <18)

## 2024-04-29 MED ORDER — ALUM & MAG HYDROXIDE-SIMETH 200-200-20 MG/5ML PO SUSP
30.0000 mL | Freq: Once | ORAL | Status: AC
Start: 2024-04-29 — End: 2024-04-29
  Administered 2024-04-29: 30 mL via ORAL
  Filled 2024-04-29: qty 30

## 2024-04-29 MED ORDER — HYOSCYAMINE SULFATE 0.125 MG SL SUBL
0.2500 mg | SUBLINGUAL_TABLET | Freq: Once | SUBLINGUAL | Status: AC
Start: 2024-04-29 — End: 2024-04-29
  Administered 2024-04-29: 0.25 mg via SUBLINGUAL
  Filled 2024-04-29: qty 2

## 2024-04-29 MED ORDER — IOHEXOL 350 MG/ML SOLN
75.0000 mL | Freq: Once | INTRAVENOUS | Status: AC | PRN
  Administered 2024-04-29: 75 mL via INTRAVENOUS

## 2024-04-29 NOTE — ED Triage Notes (Signed)
 Pt stated that she has been having CP in the middle of her chest that radiates to her right side. Pt also complaining of SOB and tightness in her stomach.

## 2024-04-29 NOTE — Discharge Instructions (Signed)
 Please follow-up with your heart doctor that should call you to schedule appointment for follow-up. Please return to ER with new or worsening symptoms.

## 2024-04-29 NOTE — ED Provider Notes (Signed)
 [place] EMERGENCY DEPARTMENT AT [facility] Provider Note   CSN: [id number] Arrival date & time: 04/29/24  1309     Patient presents with: Chest Pain   Mary Pratt is a 53 y.o. female patient with past medical history of CHF, ICD, GERD, asthma, DVT, clotting disorder presented to emergency room with complaint of chest pain shortness of breath.  Patient reports that for the past few days she has had intermittent central chest pain that seems to radiate around the right side of her chest and upper abdomen.  She notes chest pain seems to be random lasting seconds/minutes.  Denies exertional chest pain.  She does note exertional shortness of breath.  Of note she was recently restarted on Eliquis  due to an elevated D-dimer approximately 10 days ago.  She has been taking 5 mg twice a day since then.  Scheduled for bilateral DVT study which has not been done yet.  She continues to endorse bilateral calf cramping and swelling worse on the left side.   She denies any nausea vomiting or change in bowel movements.  No fever or cough.    Chest Pain Associated symptoms: shortness of breath        Prior to Admission medications   Medication Sig Start Date End Date Taking? Authorizing Provider  albuterol  (VENTOLIN  HFA) 108 (90 Base) MCG/ACT inhaler Inhale 1-2 puffs into the lungs every 4 (four) hours as needed. 01/07/24   Cari Arlean HERO, FNP  amitriptyline  (ELAVIL ) 25 MG tablet TAKE 1 TABLET BY MOUTH EVERYDAY AT BEDTIME 03/20/24   Avram Lupita BRAVO, MD  apixaban  (ELIQUIS ) 5 MG TABS tablet Take 1 tablet (5 mg total) by mouth 2 (two) times daily. Start taking after completion of starter pack. 11/01/23   Barbarann Dixon B, RPH-CPP  Azelastine  HCl 137 MCG/SPRAY SOLN Place 2 sprays into both nostrils 2 (two) times daily. 11/16/23   [provider]  carvedilol  (COREG ) 25 MG tablet TAKE 1 TABLET (25 MG TOTAL) BY MOUTH TWICE A DAY WITH MEALS 11/30/23   Alvan Dorn FALCON, MD  clobetasol  ointment  (TEMOVATE ) 0.05 % Apply 1 Application topically See admin instructions. Apply to area with itching 3 times a day for a week then twice a day for 2 weeks then daily for 1 week then twice weekly for a week.  To use with only flares 02/17/24   Glennon Almarie POUR, MD  conjugated estrogens  (PREMARIN ) vaginal cream Place 1 Applicatorful vaginally 3 (three) times a week. 03/01/24   Glennon Almarie POUR, MD  Cyanocobalamin  (VITAMIN B-12 IJ) Inject as directed every 30 (thirty) days.    [provider]  EPINEPHRINE  0.3 mg/0.3 mL IJ SOAJ injection INJECT 0.3 MG INTO THE MUSCLE AS NEEDED FOR ANAPHYLAXIS. 01/12/24   Ambs, Arlean HERO, FNP  fexofenadine (ALLEGRA) 180 MG tablet Take 180 mg by mouth daily. 01/13/24   [provider]  fluticasone  (FLONASE ) 50 MCG/ACT nasal spray Place 2 sprays into both nostrils every morning. 01/07/24   Cari Arlean HERO, FNP  fluticasone -salmeterol (ADVAIR DISKUS) 250-50 MCG/ACT AEPB Inhale 1 puff into the lungs in the morning. 01/07/24   Cari Arlean HERO, FNP  furosemide  (LASIX ) 40 MG tablet Take 1 tablet (40 mg total) by mouth daily. 01/17/24   Miriam Almarie, NP  gabapentin (NEURONTIN) 100 MG capsule Take 1 tablet by mouth at night as tolerated 02/09/24   [provider]  Galcanezumab -gnlm (EMGALITY ) 120 MG/ML SOAJ INJECT 240 MG INTO THE SKIN ONCE FOR 1 DOSE. LOADING DOSE  Patient taking differently: Inject 240 mg into the skin every 30 (thirty) days. 07/01/23   Skeet, Adam R, DO  nitroGLYCERIN  (NITROSTAT ) 0.4 MG SL tablet Place 1 tablet (0.4 mg total) under the tongue every 5 (five) minutes x 3 doses as needed for chest pain. 11/09/22   Fernande Elspeth BROCKS, MD  pantoprazole  (PROTONIX ) 40 MG tablet Take 40 mg by mouth daily.    [provider]  polyethylene glycol (MIRALAX ) 17 g packet Take 17 g by mouth daily. 03/15/24   McKenzie, Belvie CROME, MD  sacubitril -valsartan  (ENTRESTO ) 24-26 MG TAKE 1 TABLET BY MOUTH TWICE A DAY 01/13/24   Alvan Dorn FALCON, MD  sertraline  (ZOLOFT )  100 MG tablet Take 100 mg by mouth daily. 01/24/20   [provider]  spironolactone  (ALDACTONE ) 25 MG tablet TAKE 1 TABLET (25 MG TOTAL) BY MOUTH DAILY. 01/24/24   Alvan Dorn FALCON, MD  Vitamin D , Ergocalciferol , (DRISDOL) 1.25 MG (50000 UNIT) CAPS capsule Take 50,000 Units by mouth once a week. 10/26/23   [provider]    Allergies: Clarithromycin, Amoxicillin, Meloxicam, Spiriva respimat [tiotropium bromide monohydrate], and Symbicort  [budesonide -formoterol  fumarate]    Review of Systems  Respiratory:  Positive for shortness of breath.   Cardiovascular:  Positive for chest pain and leg swelling.    Updated Vital Signs BP (!) 131/96   Pulse 70   Temp 98.7 F (37.1 C)   Resp 18   Ht 5' 1 (1.549 m)   Wt 74.8 kg   LMP 09/14/2022 (Approximate)   SpO2 96%   BMI 31.18 kg/m   Physical Exam Vitals and nursing note reviewed.  Constitutional:      General: She is not in acute distress.    Appearance: She is not toxic-appearing.  HENT:     Head: Normocephalic and atraumatic.  Eyes:     General: No scleral icterus.    Conjunctiva/sclera: Conjunctivae normal.  Cardiovascular:     Rate and Rhythm: Normal rate and regular rhythm.     Pulses: Normal pulses.     Heart sounds: Normal heart sounds.  Pulmonary:     Effort: Pulmonary effort is normal. No respiratory distress.     Breath sounds: Normal breath sounds.  Abdominal:     General: Abdomen is flat. Bowel sounds are normal. There is no distension.     Palpations: Abdomen is soft. There is no mass.     Tenderness: There is no abdominal tenderness. There is no right CVA tenderness or left CVA tenderness.     Hernia: No hernia is present.  Musculoskeletal:        General: Normal range of motion.     Right lower leg: Tenderness present. Edema present.     Left lower leg: Tenderness present. Edema present.  Skin:    General: Skin is warm and dry.     Capillary Refill: Capillary refill takes less than 2  seconds.     Findings: No lesion.     Comments: Strong dorsal pedal pulses equal bilaterally.  Neurological:     General: No focal deficit present.     Mental Status: She is alert and oriented to person, place, and time. Mental status is at baseline.     (all labs ordered are listed, but only abnormal results are displayed) Labs Reviewed  BASIC METABOLIC PANEL WITH GFR - Abnormal; Notable for the following components:      Result Value   Glucose, Bld 102 (*)    Creatinine, Ser 1.09 (*)  All other components within normal limits  CBC  LIPASE, BLOOD  HEPATIC FUNCTION PANEL  BRAIN NATRIURETIC PEPTIDE  TROPONIN I (HIGH SENSITIVITY)    EKG: None  Radiology: CT ABDOMEN PELVIS W CONTRAST Result Date: 04/29/2024 CLINICAL DATA:  Acute abdominal pain and tightness. EXAM: CT ABDOMEN AND PELVIS WITH CONTRAST TECHNIQUE: Multidetector CT imaging of the abdomen and pelvis was performed using the standard protocol following bolus administration of intravenous contrast. RADIATION DOSE REDUCTION: This exam was performed according to the departmental dose-optimization program which includes automated exposure control, adjustment of the mA and/or kV according to patient size and/or use of iterative reconstruction technique. CONTRAST:  75mL OMNIPAQUE  IOHEXOL  350 MG/ML SOLN COMPARISON:  01/30/2024 FINDINGS: Lower Chest: No acute findings. Hepatobiliary: No suspicious hepatic masses identified. Stable small cysts again seen in left hepatic lobe. Gallbladder is unremarkable. No evidence of biliary ductal dilatation. Pancreas:  No mass or inflammatory changes. Spleen: Within normal limits in size and appearance. Adrenals/Urinary Tract: No suspicious masses identified. No evidence of ureteral calculi or hydronephrosis. Unremarkable unopacified urinary bladder. Stomach/Bowel: No evidence of obstruction, inflammatory process or abnormal fluid collections. Normal appendix visualized. Vascular/Lymphatic: No  pathologically enlarged lymph nodes. No acute vascular findings. Reproductive:  No mass or other significant abnormality. Other:  None. Musculoskeletal:  No suspicious bone lesions identified. IMPRESSION: No acute findings or other significant abnormality. Electronically Signed   By: Norleen DELENA Kil M.D.   On: 04/29/2024 16:25   CT Angio Chest PE W and/or Wo Contrast Result Date: 04/29/2024 CLINICAL DATA:  Right-sided chest pain and shortness of breath. High probability for pulmonary embolism. EXAM: CT ANGIOGRAPHY CHEST WITH CONTRAST TECHNIQUE: Multidetector CT imaging of the chest was performed using the standard protocol during bolus administration of intravenous contrast. Multiplanar CT image reconstructions and MIPs were obtained to evaluate the vascular anatomy. RADIATION DOSE REDUCTION: This exam was performed according to the departmental dose-optimization program which includes automated exposure control, adjustment of the mA and/or kV according to patient size and/or use of iterative reconstruction technique. CONTRAST:  75mL OMNIPAQUE  IOHEXOL  350 MG/ML SOLN COMPARISON:  10/30/2023 FINDINGS: Cardiovascular: Satisfactory opacification of pulmonary arteries noted, and no pulmonary emboli identified. No evidence of thoracic aortic dissection or aneurysm. Mild cardiomegaly with AICD again seen. Mediastinum/Nodes: No masses or pathologically enlarged lymph nodes identified. Lungs/Pleura: No pulmonary mass, infiltrate, or effusion. Upper abdomen: No acute findings. A few small hepatic cysts are again noted. Musculoskeletal: No suspicious bone lesions identified. Review of the MIP images confirms the above findings. IMPRESSION: No evidence of pulmonary embolism or other active disease. Mild cardiomegaly. Electronically Signed   By: Norleen DELENA Kil M.D.   On: 04/29/2024 16:19   DG Chest 2 View Result Date: 04/29/2024 CLINICAL DATA:  Right-sided chest pain and shortness of breath. EXAM: CHEST - 2 VIEW COMPARISON:   10/29/2023 FINDINGS: The heart size and mediastinal contours are within normal limits. AICD remains in appropriate position. Both lungs are clear. The visualized skeletal structures are unremarkable. IMPRESSION: No active cardiopulmonary disease. Electronically Signed   By: Norleen DELENA Kil M.D.   On: 04/29/2024 14:25     Procedures   Medications Ordered in the ED  alum & mag hydroxide-simeth (MAALOX/MYLANTA) 200-200-20 MG/5ML suspension 30 mL (30 mLs Oral Given 04/29/24 1430)  hyoscyamine  (LEVSIN  SL) SL tablet 0.25 mg (0.25 mg Sublingual Given 04/29/24 1429)  iohexol  (OMNIPAQUE ) 350 MG/ML injection 75 mL (75 mLs Intravenous Contrast Given 04/29/24 1521)  Medical Decision Making Amount and/or Complexity of Data Reviewed Labs: ordered. Radiology: ordered.  Risk OTC drugs. Prescription drug management.   This patient presents to the ED for concern of CP, this involves an extensive number of treatment options, and is a complaint that carries with it a high risk of complications and morbidity.  The differential diagnosis includes PE/DVT, aortic dissection, ACS, PNA, PNX, GERD, cholecystitis    Co morbidities that complicate the patient evaluation  DVT on Eliquis  Asthma  CHF   Lab Tests:  I personally interpreted labs.  The pertinent results include:   No leukocytosis.  No anemia.  BNP within normal limits.  Troponin within normal limits.  Normal hepatic function panel and lipase.   Imaging Studies ordered:  I ordered imaging studies including CT PE study, CT abd/pelvis. (DVT study not available, scheduled for this on Wednesday outpatient.)  I independently visualized and interpreted imaging which showed no acute findings. I agree with the radiologist interpretation   Cardiac Monitoring: / EKG:  The patient was maintained on a cardiac monitor.  I personally viewed and interpreted the cardiac monitored which showed an underlying rhythm of: sinus      Problem List / ED Course / Critical interventions / Medication management  Patient reports with complaint of chest pain/epigastric pain radiating around the right side of her chest wall.  This is reproducible on exam and worse with taking deep breath.  It is associated with some exertional shortness of breath and bilateral leg swelling worse on the left side.  Patient has a history of DVT and reports that her legs feel similar.  She was just recently restarted on anticoagulation due to the symptoms but has no confirmed a DVT at this time.  Her chest x-ray shows no sign of chest congestion and BNP in normal limits.  Last echo showing normal left ventricular function.  EKG shows no current ischemia and troponin normal here.  No pneumonia or pneumothorax.  Unfortunately DVT study is not available.  Will obtain CT PE study and CT abdomen pelvis to rule out acute pathology. I ordered medication including GI cocktail  Reevaluation of the patient after these medicines showed that the patient improved I have reviewed the patients home medicines and have made adjustments as needed. Patient is feeling better.  No longer having chest pain.  Imaging reassuring.  Vitals have been stable throughout stay.  Feel appropriate for discharge with close outpatient follow-up.  I placed referral to patient's cardiologist.       Final diagnoses:  Chest pain, unspecified type  Acute systolic heart failure [facility])    ED Discharge Orders          Ordered    Ambulatory referral to Cardiology       Comments: If you have not heard from the Cardiology office within the next 72 hours please call [phone].   04/29/24 1657               Tysheena Ginzburg, Warren SAILOR, PA-C 04/29/24 1758    Simon Lavonia SAILOR, MD 04/30/24 [phone]

## 2024-05-01 ENCOUNTER — Encounter: Payer: Self-pay | Admitting: Nurse Practitioner

## 2024-05-03 ENCOUNTER — Encounter: Payer: Self-pay | Admitting: Nurse Practitioner

## 2024-05-03 ENCOUNTER — Other Ambulatory Visit: Payer: Self-pay | Admitting: Nurse Practitioner

## 2024-05-03 ENCOUNTER — Ambulatory Visit (HOSPITAL_COMMUNITY)
Admission: RE | Admit: 2024-05-03 | Discharge: 2024-05-03 | Disposition: A | Discharge status: Home / Self Care | Source: Ambulatory Visit | Attending: Nurse Practitioner | Admitting: Nurse Practitioner

## 2024-05-03 DIAGNOSIS — I82442 Acute embolism and thrombosis of left tibial vein: Secondary | ICD-10-CM

## 2024-05-03 DIAGNOSIS — I82492 Acute embolism and thrombosis of other specified deep vein of left lower extremity: Secondary | ICD-10-CM | POA: Diagnosis present

## 2024-05-03 DIAGNOSIS — M79604 Pain in right leg: Secondary | ICD-10-CM

## 2024-05-03 NOTE — Progress Notes (Signed)
 Ordered LE arterial duplex study since LE  dopplers were negative for DVT bilaterally.

## 2024-05-04 ENCOUNTER — Encounter: Payer: Self-pay | Admitting: Urology

## 2024-05-04 ENCOUNTER — Other Ambulatory Visit

## 2024-05-04 ENCOUNTER — Telehealth: Payer: Self-pay | Admitting: Urology

## 2024-05-04 NOTE — Telephone Encounter (Signed)
 Pt is made aware we received CD and once MD, McKenzie review it. Someone will call back with MD recommendation.

## 2024-05-04 NOTE — Telephone Encounter (Signed)
 Patient is having pain and wants Dr Sherrilee to look at ultrasound done in [place]. She dropped of CD

## 2024-05-05 ENCOUNTER — Other Ambulatory Visit: Payer: Self-pay

## 2024-05-05 MED ORDER — FLUTICASONE-SALMETEROL 250-50 MCG/ACT IN AEPB
1.0000 | INHALATION_SPRAY | Freq: Every morning | RESPIRATORY_TRACT | 2 refills | Status: AC
Start: 2024-05-05 — End: ?

## 2024-05-05 NOTE — Telephone Encounter (Signed)
 Received fax from pharmacy and sent in medication.

## 2024-05-09 ENCOUNTER — Telehealth (HOSPITAL_COMMUNITY): Payer: Self-pay

## 2024-05-09 NOTE — Telephone Encounter (Signed)
 I have attempted contacting the referring office 3 times since the request for this patient was entered. In an attempt to obtain necessary authorization information for the study they requested. Attempted contacts as follows:  05/04/24 secure chat sent to provider requesting assistance 05/05/24 repeat secure chat message to provider requesting assistance 05/09/24 called office, transferred and left message to request authorization information.   Due to no response, entering phone note for clarity and will forward to provider.

## 2024-05-09 NOTE — Telephone Encounter (Signed)
 Please see previous attached telephone encounters. Per 8/28 telephone encounter patient was made aware that CD was received.

## 2024-05-09 NOTE — Telephone Encounter (Signed)
 Patient asking if CD has been reviewed?  Please advise.  Call:  [phone]

## 2024-05-10 ENCOUNTER — Encounter: Payer: Self-pay | Admitting: Cardiology

## 2024-05-11 ENCOUNTER — Ambulatory Visit (HOSPITAL_COMMUNITY)
Admission: RE | Admit: 2024-05-11 | Discharge: 2024-05-11 | Disposition: A | Discharge status: Home / Self Care | Source: Ambulatory Visit | Attending: Nurse Practitioner | Admitting: Nurse Practitioner

## 2024-05-11 ENCOUNTER — Other Ambulatory Visit (HOSPITAL_COMMUNITY): Payer: Self-pay | Admitting: Nurse Practitioner

## 2024-05-11 ENCOUNTER — Ambulatory Visit (HOSPITAL_BASED_OUTPATIENT_CLINIC_OR_DEPARTMENT_OTHER)
Admission: RE | Admit: 2024-05-11 | Discharge: 2024-05-11 | Disposition: A | Discharge status: Home / Self Care | Source: Ambulatory Visit | Attending: Vascular Surgery | Admitting: Vascular Surgery

## 2024-05-11 ENCOUNTER — Ambulatory Visit: Payer: Self-pay | Admitting: Nurse Practitioner

## 2024-05-11 ENCOUNTER — Ambulatory Visit: Admitting: Oncology

## 2024-05-11 DIAGNOSIS — M79604 Pain in right leg: Secondary | ICD-10-CM | POA: Diagnosis present

## 2024-05-11 DIAGNOSIS — M79605 Pain in left leg: Secondary | ICD-10-CM | POA: Insufficient documentation

## 2024-05-11 DIAGNOSIS — I82442 Acute embolism and thrombosis of left tibial vein: Secondary | ICD-10-CM | POA: Insufficient documentation

## 2024-05-11 LAB — VAS US ABI WITH/WO TBI
Left ABI: 1.1
Right ABI: 1.14

## 2024-05-12 ENCOUNTER — Emergency Department (HOSPITAL_COMMUNITY)

## 2024-05-12 ENCOUNTER — Other Ambulatory Visit: Payer: Self-pay

## 2024-05-12 ENCOUNTER — Encounter (HOSPITAL_COMMUNITY): Payer: Self-pay | Admitting: Emergency Medicine

## 2024-05-12 ENCOUNTER — Emergency Department (HOSPITAL_COMMUNITY)
Admission: EM | Admit: 2024-05-12 | Discharge: 2024-05-13 | Disposition: A | Discharge status: Home / Self Care | Attending: Emergency Medicine | Admitting: Emergency Medicine

## 2024-05-12 DIAGNOSIS — M545 Low back pain, unspecified: Secondary | ICD-10-CM | POA: Diagnosis not present

## 2024-05-12 DIAGNOSIS — G8929 Other chronic pain: Secondary | ICD-10-CM | POA: Insufficient documentation

## 2024-05-12 DIAGNOSIS — J45909 Unspecified asthma, uncomplicated: Secondary | ICD-10-CM | POA: Insufficient documentation

## 2024-05-12 DIAGNOSIS — I509 Heart failure, unspecified: Secondary | ICD-10-CM | POA: Diagnosis not present

## 2024-05-12 DIAGNOSIS — R109 Unspecified abdominal pain: Secondary | ICD-10-CM | POA: Diagnosis present

## 2024-05-12 MED ORDER — ONDANSETRON 4 MG PO TBDP
4.0000 mg | ORAL_TABLET | Freq: Once | ORAL | Status: AC
  Administered 2024-05-13: 4 mg via ORAL
  Filled 2024-05-12: qty 1

## 2024-05-12 NOTE — ED Triage Notes (Signed)
 Pt with c/o R sided pain, back pain, decreased emptying of bladder, fatigue, abdominal pain, and bloating, and pain in legs bilaterally when she stands up.

## 2024-05-13 LAB — CBC
HCT: 35.1 % — ABNORMAL LOW (ref 36.0–46.0)
Hemoglobin: 11.6 g/dL — ABNORMAL LOW (ref 12.0–15.0)
MCH: 27 pg (ref 26.0–34.0)
MCHC: 33 g/dL (ref 30.0–36.0)
MCV: 81.8 fL (ref 80.0–100.0)
Platelets: 188 K/uL (ref 150–400)
RBC: 4.29 MIL/uL (ref 3.87–5.11)
RDW: 14.7 % (ref 11.5–15.5)
WBC: 6.4 K/uL (ref 4.0–10.5)
nRBC: 0 % (ref 0.0–0.2)

## 2024-05-13 LAB — BASIC METABOLIC PANEL WITH GFR
Anion gap: 10 (ref 5–15)
BUN: 11 mg/dL (ref 6–20)
CO2: 24 mmol/L (ref 22–32)
Calcium: 9 mg/dL (ref 8.9–10.3)
Chloride: 105 mmol/L (ref 98–111)
Creatinine, Ser: 1.1 mg/dL — ABNORMAL HIGH (ref 0.44–1.00)
GFR, Estimated: 60 mL/min — ABNORMAL LOW (ref >60)
Glucose, Bld: 92 mg/dL (ref 70–99)
Potassium: 3.6 mmol/L (ref 3.5–5.1)
Sodium: 139 mmol/L (ref 135–145)

## 2024-05-13 LAB — URINALYSIS, ROUTINE W REFLEX MICROSCOPIC
Bilirubin Urine: NEGATIVE
Glucose, UA: NEGATIVE mg/dL
Hgb urine dipstick: NEGATIVE
Ketones, ur: NEGATIVE mg/dL
Leukocytes,Ua: NEGATIVE
Nitrite: NEGATIVE
Protein, ur: NEGATIVE mg/dL
Specific Gravity, Urine: 1.017 (ref 1.005–1.030)
pH: 5 (ref 5.0–8.0)

## 2024-05-13 MED ORDER — PREDNISONE 10 MG PO TABS: 20.0000 mg | ORAL_TABLET | Freq: Every day | ORAL | 0 refills | Status: DC

## 2024-05-13 NOTE — ED Provider Notes (Signed)
 Patient with worsening disc protrusion at L4-5 on the right.  We are not able to do an MRI at [facility] because she has a pacemaker and they will not be able to do the MRI at [facility] on the weekend.  Patient is sent home with prednisone  and will follow-up with her neurosurgeon.  She is told if she gets worse to go to [facility]   Suzette Pac, MD 05/13/24 [phone]

## 2024-05-13 NOTE — Discharge Instructions (Signed)
 Follow-up with your neurosurgeon in the next 1 to 2 weeks.  If you get worse go to [facility]

## 2024-05-13 NOTE — ED Provider Notes (Signed)
 [facility]-EMERGENCY DEPT [facility] Emergency Department Provider Note MRN:  [id number]  Arrival date & time: 05/13/24     Chief Complaint   Multiple complaints   History of Present Illness   UNNAMED Mary Pratt is a 53 y.o. year-old female with a history of CHF, GERD presenting to the ED with chief complaint of flank pain.  Continued right flank pain for the past few months, worse over the past few days.  Continued sensation of incomplete emptying of bladder also worsening.  Denies fever.  Review of Systems  A thorough review of systems was obtained and all systems are negative except as noted in the HPI and PMH.   Patient's Health History    Past Medical History:  Diagnosis Date   Abnormal pap 05/2012   ASCUS + HPV, 11/14 LGSIL   Allergy     Anemia    Anxiety    Asthma    Cardiac defibrillator in place    Chronic systolic CHF (congestive heart failure) (HCC)    a. 02/2015 Echo: EF 20%, sev dil w/ diff HK, worse @ inf base. Tiv AI, mild MR, mod dil LA, PASP .   Clotting disorder (HCC)    DVT of lower extremity (deep venous thrombosis) (HCC)    Functional dyspepsia    early satiety    GERD with stricture    dilated 2010   HELICOBACTER PYLORI GASTRITIS 01/15/2009   dyspnea, ? reaction to Biaxin/amoxicillin  Pylera 01/15/09 - completed therapy      IBS (irritable bowel syndrome)    NICM (nonischemic cardiomyopathy) (HCC)    a. 02/2015 Cath: nl cors, EF 15%, mild PAH;  b. 02/2015 Enrolled in VEST trial.   Sickle cell trait (HCC)    Situational depression    son died   Vitamin B 12 deficiency 11/2022    Past Surgical History:  Procedure Laterality Date   BIV ICD GENERATOR CHANGEOUT N/A 01/24/2021   Procedure: BIV ICD GENERATOR CHANGEOUT;  Surgeon: Fernande Elspeth BROCKS, MD;  Location: [facility] INVASIVE CV LAB;  Service: Cardiovascular;  Laterality: N/A;   CARDIAC CATHETERIZATION N/A 02/26/2015   Procedure: Right/Left Heart Cath and Coronary Angiography;  Surgeon: Debby DELENA Sor,  MD;  Location: [facility] INVASIVE CV LAB;  Service: Cardiovascular;  Laterality: N/A;   COLPOSCOPY  09/08/2011   neg   EP IMPLANTABLE DEVICE N/A 10/14/2015   Procedure: BiV ICD Insertion CRT-D;  Surgeon: Elspeth BROCKS Fernande, MD;  Location: [facility] INVASIVE CV LAB;  Service: Cardiovascular;  Laterality: N/A;   ESOPHAGOGASTRODUODENOSCOPY (EGD) WITH ESOPHAGEAL DILATION  11/30/2008   esophageal ring dilated 54 French, H. pylori gastritis   HARVEST BONE GRAFT  02/2023   TUBAL LIGATION  09/07/1989   WISDOM TOOTH EXTRACTION     WRIST SURGERY  2023    Family History  Problem Relation Age of Onset   Diabetes Mother    Hypertension Mother    Hypothyroidism Mother    Prostate cancer Father 31   Breast cancer Maternal Aunt 28   Breast cancer Maternal Aunt 58   Breast cancer Maternal Aunt 36   Brain cancer Maternal Uncle 47   Brain cancer Maternal Uncle 1   Asthma Paternal Aunt    Prostate cancer Paternal Uncle 2   Prostate cancer Paternal Uncle 74   Prostate cancer Paternal Uncle 39   Lung cancer Maternal Grandmother 83   Eczema Daughter    Allergic rhinitis Daughter    Colon cancer Neg Hx    Colon polyps Neg Hx  Esophageal cancer Neg Hx     Social History   Socioeconomic History   Marital status: Single    Spouse name: Not on file   Number of children: 3   Years of education: Not on file   Highest education level: Not on file  Occupational History   Occupation: texturing operator    Employer: [employer]  Tobacco Use   Smoking status: Never    Passive exposure: Never   Smokeless tobacco: Never  Vaping Use   Vaping status: Never Used  Substance and Sexual Activity   Alcohol use: Yes    Comment: occasional wine   Drug use: No   Sexual activity: Yes    Partners: Male    Birth control/protection: Surgical, Post-menopausal    Comment: BTL  Other Topics Concern   Not on file  Social History Narrative   Single, 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   2 sons 1 daughter  1 son deceased   Drinks caffeine  up to a few a day, never smoker no drug use   Occ wine   Social Drivers of Corporate investment banker Strain: Not on file  Food Insecurity: No Food Insecurity (10/15/2023)   Hunger Vital Sign    Worried About Running Out of Food in the Last Year: Never true    Ran Out of Food in the Last Year: Never true  Transportation Needs: No Transportation Needs (10/15/2023)   PRAPARE - Administrator, Civil Service (Medical): No    Lack of Transportation (Non-Medical): No  Physical Activity: Not on file  Stress: Not on file  Social Connections: Unknown (01/20/2022)   Received from [facility]   Social Network    Social Network: Not on file  Intimate Partner Violence: Not At Risk (10/15/2023)   Humiliation, Afraid, Rape, and Kick questionnaire    Fear of Current or Ex-Partner: No    Emotionally Abused: No    Physically Abused: No    Sexually Abused: No     Physical Exam   Vitals:   05/12/24 2321 05/12/24 2330  BP: (!) 119/91 105/72  Pulse: 66 65  Resp: 18   Temp: 98.1 F (36.7 C)   SpO2: 97% 96%    CONSTITUTIONAL: Well-appearing, NAD NEURO/PSYCH:  Alert and oriented x 3, no focal deficits EYES:  eyes equal and reactive ENT/NECK:  no LAD, no JVD CARDIO: Regular rate, well-perfused, normal S1 and S2 PULM:  CTAB no wheezing or rhonchi GI/GU:  non-distended, non-tender MSK/SPINE:  No gross deformities, no edema SKIN:  no rash, atraumatic   *Additional and/or pertinent findings included in MDM below  Diagnostic and Interventional Summary    EKG Interpretation Date/Time:    Ventricular Rate:    PR Interval:    QRS Duration:    QT Interval:    QTC Calculation:   R Axis:      Text Interpretation:         Labs Reviewed  CBC - Abnormal; Notable for the following components:      Result Value   Hemoglobin 11.6 (*)    HCT 35.1 (*)    All other components within normal limits  BASIC METABOLIC PANEL WITH GFR - Abnormal; Notable  for the following components:   Creatinine, Ser 1.10 (*)    GFR, Estimated 60 (*)    All other components within normal limits  URINALYSIS, ROUTINE W REFLEX MICROSCOPIC    CT RENAL STONE STUDY  Final Result  CT L-SPINE NO CHARGE  Final Result    MR LUMBAR SPINE WO CONTRAST    (Results Pending)    Medications  ondansetron  (ZOFRAN -ODT) disintegrating tablet 4 mg (4 mg Oral Given 05/13/24 0012)     Procedures  /  Critical Care Procedures  ED Course and Medical Decision Making  Initial Impression and Ddx Patient is well-appearing in no acute distress with normal vital signs.  Has had some evaluation for these complaints over the past few months in the form of CT scan with contrast, renal ultrasound.  Symptoms seem to be worsening, no obvious explanation.  15 mm cyst to the right kidney on the ultrasound in June not likely to be the culprit.  Given worsening symptoms will recheck renal function, will obtain CT renal without contrast to evaluate for kidney stone.  Patient also endorsing leg pain and occasional foot paresthesia, will evaluate the lumbar spine with the CT scan as well.  Past medical/surgical history that increases complexity of ED encounter: Degenerative disc disease  Interpretation of Diagnostics I personally reviewed the Laboratory Testing and my interpretation is as follows: No significant blood count or electrolyte disturbance.  CT showing no renal abnormalities.  CT lumbar spine showing increased disc protrusion at L4-L5.  Given this and patient's back pain, sensation of incomplete voiding, occasional bilateral leg pain/paresthesias, will obtain MRI to exclude cord involvement.   Patient Reassessment and Ultimate Disposition/Management      Signed out to oncoming provider at shift change.  Patient management required discussion with the following services or consulting groups:  None  Complexity of Problems Addressed Acute illness or injury that poses threat of  life of bodily function  Additional Data Reviewed and Analyzed Further history obtained from: Prior ED visit notes and Prior labs/imaging results  Additional Factors Impacting ED Encounter Risk Consideration of hospitalization  Ozell HERO. Theadore, MD [facility] Emergency Medicine [facility] [email or web address]  Final Clinical Impressions(s) / ED Diagnoses     ICD-10-CM   1. Chronic right-sided low back pain without sciatica  M54.50    G89.29       ED Discharge Orders     None        Discharge Instructions Discussed with and Provided to Patient:   Discharge Instructions   None      Theadore Ozell HERO, MD 05/13/24 0127

## 2024-05-15 ENCOUNTER — Ambulatory Visit: Attending: Nurse Practitioner | Admitting: Nurse Practitioner

## 2024-05-15 ENCOUNTER — Encounter: Payer: Self-pay | Admitting: Nurse Practitioner

## 2024-05-15 VITALS — BP 118/70 | HR 72 | Ht 61.0 in | Wt 170.0 lb

## 2024-05-15 DIAGNOSIS — R079 Chest pain, unspecified: Secondary | ICD-10-CM

## 2024-05-15 DIAGNOSIS — I5022 Chronic systolic (congestive) heart failure: Secondary | ICD-10-CM | POA: Diagnosis present

## 2024-05-15 DIAGNOSIS — Z13220 Encounter for screening for lipoid disorders: Secondary | ICD-10-CM

## 2024-05-15 DIAGNOSIS — I428 Other cardiomyopathies: Secondary | ICD-10-CM | POA: Diagnosis not present

## 2024-05-15 DIAGNOSIS — I5032 Chronic diastolic (congestive) heart failure: Secondary | ICD-10-CM

## 2024-05-15 DIAGNOSIS — E7800 Pure hypercholesterolemia, unspecified: Secondary | ICD-10-CM | POA: Diagnosis present

## 2024-05-15 DIAGNOSIS — Z86718 Personal history of other venous thrombosis and embolism: Secondary | ICD-10-CM

## 2024-05-15 DIAGNOSIS — I10 Essential (primary) hypertension: Secondary | ICD-10-CM

## 2024-05-15 MED ORDER — NITROGLYCERIN 0.4 MG SL SUBL: 0.4000 mg | SUBLINGUAL_TABLET | SUBLINGUAL | 12 refills | Status: AC | PRN

## 2024-05-15 MED ORDER — CARVEDILOL 25 MG PO TABS
12.5000 mg | ORAL_TABLET | Freq: Two times a day (BID) | ORAL | 1 refills | Status: AC
Start: 2024-05-15 — End: ?

## 2024-05-15 MED ORDER — AMLODIPINE BESYLATE 2.5 MG PO TABS: 2.5000 mg | ORAL_TABLET | Freq: Every day | ORAL | 0 refills | Status: DC

## 2024-05-15 NOTE — Progress Notes (Signed)
 Cardiology Office Note:   Date: 05/15/2024 ID:  Mary Pratt, DOB [birth year], MRN [id number] PCP:  Toribio Jerel MATSU, MD [facility] Providers Cardiologist:  Alvan Carrier, MD Electrophysiologist:  Elspeth Sage, MD     Referring MD: Toribio Jerel MATSU, MD   CC: Here for scheduled follow-up  History of Present Illness:    Mary Pratt is a 53 y.o. female with a PMH of chronic systolic CHF, s/p ICD, NICM, hypertension, asthma, history of pain, GERD, acute DVT, and SS trait, who presents today for scheduled follow-up.  Cardiac catheterization in 2016 revealed no CAD.  EF returned to normal after CRT-D was placed in 2017.  Low A-fib burden on device check previously.  Not on anticoagulation.  ED visit on 10/08/2023 at [facility] for atypical CP. Nothing seemed to make it better/worse. Work-up was overall unremarkable.   She returned to the ED on October 10, 2023 for chest pain with associated SHOB x 3-4 days. No PE on CT scan. Workup was unremarkable.   10/19/2023 - Today she presents for follow-up.  She admits to several chief concerns including shortness of breath that is particularly noticed when laying down, admits to 2 pillow orthopnea, says she feels bloated, and also admits to insomnia.  Denies any recurrent chest pain since leaving the ED. Denies any palpitations, syncope, presyncope, dizziness, PND, swelling or significant weight changes, acute bleeding, or claudication.  She tells me she is now being followed by hematologist who is done a thorough workup regarding her history of sickle cell.  ED visit on 10/29/2023 for chest pain, workup was overall unremarkable.   11/30/2023 -today she presents for follow-up.  Doing better from when I last saw her. Denies any chest pain, palpitations, syncope, presyncope, dizziness, orthopnea, PND, swelling or significant weight changes, acute bleeding, or claudication. Continues to admit to occasional shortness of breath.   05/15/2024 -she is here  today and admits to intermittent chest pain since March, denies any specific triggers and Tylenol  seems to help a little bit, admits to headache, chest pain episodes last only few seconds.  Chest pain goes to right to the middle of her chest, sharp in nature. She says laying down bothers her and admits to legs feeling numb.  Cannot stand for long, does admit to some hip pain. Denies any shortness of breath, palpitations, syncope, presyncope, dizziness, orthopnea, PND, swelling or significant weight changes, acute bleeding, or claudication.  She had recent ABIs done just a few days ago that were normal.  Please see the history of present illness.    All other systems reviewed and are negative.  EKGs/Labs/Other Studies Reviewed:    The following studies were reviewed today:   EKG:  EKG Interpretation Date/Time:  Monday May 15 2024 09:16:30 EDT Ventricular Rate:  69 PR Interval:  168 QRS Duration:  124 QT Interval:  476 QTC Calculation: 510 R Axis:   140  Text Interpretation: Atrial-sensed ventricular-paced rhythm When compared with ECG of 29-Apr-2024 13:25, PREVIOUS ECG IS PRESENT Confirmed by Miriam Norris [phone]) on 05/16/2024 2:51:33 PM   Echo 10/2023:  1. Left ventricular ejection fraction, by estimation, is 55 to 60%. The  left ventricle has normal function. The left ventricle has no regional  wall motion abnormalities. There is mild left ventricular hypertrophy.  Left ventricular diastolic parameters  are indeterminate. The global longitudinal strain is indeterminate.   2. Right ventricular systolic function is normal. The right ventricular  size is normal. Tricuspid regurgitation signal is inadequate  for assessing  PA pressure.   3. The mitral valve is normal in structure. No evidence of mitral valve  regurgitation. No evidence of mitral stenosis.   4. The tricuspid valve is abnormal.   5. The aortic valve is tricuspid. Aortic valve regurgitation is not  visualized. No  aortic stenosis is present.   Comparison(s): A prior study was performed on 06/25/2021. EF 50-55%. Mild LVH.  Venous US  lower extremity doppler 10/2023: IMPRESSION: Positive for deep venous thrombosis isolated to the left anterior tibial vein.  Myoview  on 07/04/2021: Normal, low risk.   Echocardiogram on 06/25/2021:  1. Abnormal septal motion from pacing . Left ventricular ejection  fraction, by estimation, is 50 to 55%. The left ventricle has low normal  function. The left ventricle has no regional wall motion abnormalities.  There is mild left ventricular  hypertrophy. Left ventricular diastolic parameters were normal.   2. Pacing wires in RA/RV. Right ventricular systolic function is normal.  The right ventricular size is normal. There is normal pulmonary artery  systolic pressure.   3. The mitral valve is normal in structure. Trivial mitral valve  regurgitation. No evidence of mitral stenosis.   4. The aortic valve is normal in structure. Aortic valve regurgitation is  not visualized. No aortic stenosis is present.   5. The inferior vena cava is normal in size with greater than 50%  respiratory variability, suggesting right atrial pressure of 3 mmHg.  Right and left heart cath on 02/26/2015: There is severe left ventricular systolic dysfunction. Very mild elevation of right sided heart pressures Central aortic oxygen saturation 98%; pulmonary artery oxygen saturation 65%.   Dilated severe nonischemic cardiomyopathy with an ejection fraction of 15%.   Normal coronary arteries.   Mild pulmonary hypertension.   RECOMMENDATION:   Aggressive medical therapy for her nonischemic cardiomyopathy with medication titration over the next several months.  Consider interim LifeVest, pending follow-up LV function assessment in several months.  Physical Exam:    VS:  BP 118/70   Pulse 72   Ht 5' 1 (1.549 m)   Wt 170 lb (77.1 kg)   LMP 09/14/2022 (Approximate)   SpO2 97%   BMI  32.12 kg/m     Wt Readings from Last 3 Encounters:  05/18/24 167 lb (75.8 kg)  05/15/24 170 lb (77.1 kg)  05/12/24 165 lb 5.5 oz (75 kg)     GEN: Obese, 53 y.o. female in no acute distress HEENT: Normal NECK: No JVD; No carotid bruits CARDIAC: S1/S2, RRR, no murmurs, rubs, gallops; 2+ peripheral pulses throughout, strong and equal bilaterally RESPIRATORY:  Clear to auscultation without rales, wheezing or rhonchi  MUSCULOSKELETAL:  No edema; No deformity  SKIN: Warm and dry NEUROLOGIC:  Alert and oriented x 3 PSYCHIATRIC:  Normal affect   ASSESSMENT & PLAN:    In order of problems listed above:  Chest pain of uncertain etiology, screening for HLD Chest pain etiology is atypical and not felt to be cardiac in nature.  EKG is reassuring today.  Stress test from 2022 was low risk.  Did discuss risk of radiation exposure due to multiple recent CT images.  No significant findings noted on recent CT angio chest from a cardiovascular perspective.  Will begin amlodipine  2.5 mg daily and decrease carvedilol  to 12.5 mg twice daily to help symptoms if this is possibly due to vasospasm.  Will refill her nitroglycerin .  No other medication changes at this time.  Continue to follow with PCP. Heart healthy diet and  regular cardiovascular exercise encouraged.  Care and ED precautions discussed. Will obtain FLP.   HFimpEF, s/p ICD, NICM Stage C, NYHA class I-II symptoms. EF normal 10/2023. Euvolemic and well compensated on exam. Continue current medication regimen. Low sodium diet, fluid restriction <2L, and daily weights encouraged. Educated to contact our office for weight gain of 2 lbs overnight or 5 lbs in one week.  Denies any shocks from her ICD.  Most recent remote device check was normal.  Continue follow-up with EP as scheduled. Instructed her to contact Allergy /Asthma specialist for further follow-up of her occasional [facility], felt to be pulmonary related.   Hypertension Blood pressure stable.  BP  well-controlled at home.  Medication changes as noted above-continue rest of medication regimen.  Heart healthy diet and regular cardiovascular exercise encouraged.   3. Hx of DVT Most recent lower extremity Doppler was negative for DVT or popliteal cyst. Continue to follow with PCP.  Disposition: Follow-up with Dr. Dorn Ross or [facility] in 4-6 weeks or sooner if anything changes.  Medication Adjustments/Labs and Tests Ordered: Current medicines are reviewed at length with the patient today.  Concerns regarding medicines are outlined above.  Orders Placed This Encounter  Procedures   Lipid Profile   EKG 12-Lead   Meds ordered this encounter  Medications   nitroGLYCERIN  (NITROSTAT ) 0.4 MG SL tablet    Sig: Place 1 tablet (0.4 mg total) under the tongue every 5 (five) minutes x 3 doses as needed for chest pain.    Dispense:  25 tablet    Refill:  12   carvedilol  (COREG ) 25 MG tablet    Sig: Take 0.5 tablets (12.5 mg total) by mouth 2 (two) times daily with a meal.    Dispense:  90 tablet    Refill:  1    Dose change 05/15/24   amLODipine  (NORVASC ) 2.5 MG tablet    Sig: Take 1 tablet (2.5 mg total) by mouth daily.    Dispense:  90 tablet    Refill:  0    New 05/15/24    Patient Instructions  Medication Instructions:  Your physician has recommended you make the following change in your medication:  Please Decrease Carvedilol  to 12.5 Mg Twice daily  Please start Amlodipine  2.5 Mg daily   Labwork: In 2-3 weeks at [facility]   Testing/Procedures: None   Follow-Up: Your physician recommends that you schedule a follow-up appointment in: 4-6 weeks   Any Other Special Instructions Will Be Listed Below (If Applicable).  If you need a refill on your cardiac medications before your next appointment, please call your pharmacy.   Signed, Almarie Crate, NP

## 2024-05-15 NOTE — Patient Instructions (Addendum)
 Medication Instructions:  Your physician has recommended you make the following change in your medication:  Please Decrease Carvedilol  to 12.5 Mg Twice daily  Please start Amlodipine  2.5 Mg daily   Labwork: In 2-3 weeks at [facility]   Testing/Procedures: None   Follow-Up: Your physician recommends that you schedule a follow-up appointment in: 4-6 weeks   Any Other Special Instructions Will Be Listed Below (If Applicable).  If you need a refill on your cardiac medications before your next appointment, please call your pharmacy.

## 2024-05-17 NOTE — Progress Notes (Unsigned)
 NEUROLOGY FOLLOW UP OFFICE NOTE  Mary Pratt [id number]  Assessment/Plan:   Dyesthesias/paresthesias - unclear etiology.  Distribution does not correlate with a polyneuropathy - seems more likely to be environmental or related to vitamin deficiency.  At this time, she has B12 deficiency which may be the culprit.   Also with borderline low B1 level Chronic migraine without aura, without status migrainosus, not intractable   Continue B12 shots.  Consider taking thiamine  supplement.  Re-evaluate at follow up For migraine prevention, will start Emgality .  Already has been on beta blocker and had potential adverse reaction to topiramate . Follow up on heavy metal screen Follow up 4 months.  Subjective:  Mary Pratt is a 53 year old female with history of CHF, asthma, cardiac defibrillator, DVT on AC, IBS, depression and anxiety who follows up for paresthesias and headaches.  MRIs personally reviewed.  UPDATE: Paresthesias: NCV-EMG of right upper and lower extremities on 12/03/2022 was normal.  She then said that the numbness was left sided, including scalp.  MRI of brain with and without contrast on 04/13/2023 was normal.  She then stated that she was having numbness and tingling bilaterally in the toes, ankles and fingertips.  Also endorsed stiffness in shoulders and excessive daytime fatigue.  She saw another neurologist for s second opinion and had a NCV-EMG of the left upper and lower extremities on 07/07/2023 which was normal.  She was diagnosed with DVT.  She has since been diagnosed with shoulder tendinitis and has been seen in the ED on several occasions, including twice in February 2025 for vertigo and nonspecific chest pain.  CT head on 10/26/2023 unremarkable.  CTA chest negative for PE.  Since at least May 2025, she has had bilateral low back pain with radiculitis bilaterally, for which she has been seen in the ED.  Seen by neurosurgery.  MRI of lumbar spine on 10/28/2023 revealed  central disc extrusion causing mild left lateral recess narrowing at L4-L5 but no central spinal canal or neural foraminal stenosis.  She has IBS but has been experiencing bloating, early satiety and black stool.  Seen by GI.  ***.  Labs in August 2025 revealed mildly elevated sed rate 23 but overall were unremarkable, including negative ANA, RF, and lupus anticoagulant  Migraines: Intensity:  *** Duration:  *** Frequency:  ***  Current medications: Current NSAIDS/analgesics:  Tylenol  Current triptans:  none Current ergotamine:  none Current anti-emetic:  none Current muscle relaxants:  none Current Antihypertensive medications:  carvedilol , amlodipine , furosemide  Current Antidepressant medications:  amitriptyline  25mg  at bedtime, sertraline  100mg  BID Current Anticonvulsant medications:  gabapentin 100mg  QPM Current anti-CGRP:  Emgality  Current Antihistamines/Decongestants:  Allegra, Astelin , Flonase  Current vitamins/supplements:  B12 ***, vit D Other therapy:  none Other medication:  Eliquis   HISTORY: Headaches: She has longstanding history of recurrent sinusitis and allergic rhinitis.  She has undergone sinus surgery in the past.  For years, she has been experiencing congestion in the nose, head and ears.  She takes Allegra daily and sometimes Flonase  or Astelin  spray.  Since her 77s, she has had daily headaches which she has always attributed to her sinuses.  They are mild to moderate mid-frontal throbbing pain and head pressure, sometimes with nausea, photophobia and phonophobia.  Has ear aches.  Worse in the morning.  They last 2-3 hours.  She will treat with her allergy  medications which help, but they will return later in the day.  She sometimes treats headaches with Tylenol , but only once a  week.  Sometimes her right shoulder feels stiff.  Reports some blurred vision.  Evaluated by the eye doctor.  Exam was fine.  She also reports excessive daytime somnolence and will sleep during the  day.  Sleep at night is variable.  She has previously been worked up for sleep apnea, which was negative.  She saw ENT in November 2023.  CT sinuses on 07/28/2022 showed trace mucosal thickening but patent sinuses and only mild right deviated septum.     She does have history of migraines as a child, which would cause her to lay in a dark and quiet room.    Paresthesias: In March 2024, she started experiencing numbness, tingling and burning sensation in the right hand, feet, scalp and torso.  Also she noted her palms looked orange.  She had carpal tunnel surgery in September 2023 on the left.  It did not help.  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  She was advised to discontinue topiramate .  No improvement.  Labs on 11/18/2022 revealed low B12 of 167 and B1 of 6 but other labs including ANA, ENA, IFE, Lyme, ACE 32, TSH 0.41, B6 6.6, and folate 12 were unremarkable.  Heavy metal screen negative.    Past medications: Past NSAIDS/analgesics:  Fioricet  Past abortive triptans:  none Past abortive ergotamine:  none Past muscle relaxants:  baclofen  Past anti-emetic:  none Past antihypertensive medications:  furosemide  Past antidepressant medications:  duloxetine  Past anticonvulsant medications:  topiramate  (paresthesias without improvement) Past anti-CGRP:  none  PAST MEDICAL HISTORY: Past Medical History:  Diagnosis Date   Abnormal pap 05/2012   ASCUS + HPV, 11/14 LGSIL   Allergy     Anemia    Anxiety    Asthma    Cardiac defibrillator in place    Chronic systolic CHF (congestive heart failure) (HCC)    a. 02/2015 Echo: EF 20%, sev dil w/ diff HK, worse @ inf base. Tiv AI, mild MR, mod dil LA, PASP .   Clotting disorder (HCC)    DVT of lower extremity (deep venous thrombosis) (HCC)    Functional dyspepsia    early satiety    GERD with stricture    dilated 2010   HELICOBACTER PYLORI GASTRITIS  01/15/2009   dyspnea, ? reaction to Biaxin/amoxicillin  Pylera 01/15/09 - completed therapy      IBS (irritable bowel syndrome)    NICM (nonischemic cardiomyopathy) (HCC)    a. 02/2015 Cath: nl cors, EF 15%, mild PAH;  b. 02/2015 Enrolled in VEST trial.   Sickle cell trait (HCC)    Situational depression    son died   Vitamin B 12 deficiency 11/2022    MEDICATIONS: Current Outpatient Medications on File Prior to Visit  Medication Sig Dispense Refill   albuterol  (VENTOLIN  HFA) 108 (90 Base) MCG/ACT inhaler Inhale 1-2 puffs into the lungs every 4 (four) hours as needed. 18 g 1   amitriptyline  (ELAVIL ) 25 MG tablet TAKE 1 TABLET BY MOUTH EVERYDAY AT BEDTIME 90 tablet 2   amLODipine  (NORVASC ) 2.5 MG tablet Take 1 tablet (2.5 mg total) by mouth daily. 90 tablet 0   apixaban  (ELIQUIS ) 5 MG TABS tablet Take 1 tablet (5 mg total) by mouth 2 (two) times daily. Start taking after completion of starter pack. 60 tablet 4   Azelastine  HCl 137 MCG/SPRAY SOLN Place 2 sprays into both nostrils 2 (two) times daily.  carvedilol  (COREG ) 25 MG tablet Take 0.5 tablets (12.5 mg total) by mouth 2 (two) times daily with a meal. 90 tablet 1   clobetasol  ointment (TEMOVATE ) 0.05 % Apply 1 Application topically See admin instructions. Apply to area with itching 3 times a day for a week then twice a day for 2 weeks then daily for 1 week then twice weekly for a week.  To use with only flares 30 g 1   conjugated estrogens  (PREMARIN ) vaginal cream Place 1 Applicatorful vaginally 3 (three) times a week. 42.5 g 0   Cyanocobalamin  (VITAMIN B-12 IJ) Inject as directed every 30 (thirty) days.     EPINEPHRINE  0.3 mg/0.3 mL IJ SOAJ injection INJECT 0.3 MG INTO THE MUSCLE AS NEEDED FOR ANAPHYLAXIS. 2 each 1   fexofenadine (ALLEGRA) 180 MG tablet Take 180 mg by mouth daily.     fluticasone  (FLONASE ) 50 MCG/ACT nasal spray Place 2 sprays into both nostrils every morning. 48 g 1   fluticasone -salmeterol (ADVAIR DISKUS) 250-50  MCG/ACT AEPB Inhale 1 puff into the lungs in the morning. 60 each 2   furosemide  (LASIX ) 40 MG tablet Take 1 tablet (40 mg total) by mouth daily. 30 tablet 5   gabapentin (NEURONTIN) 100 MG capsule Take 1 tablet by mouth at night as tolerated     Galcanezumab -gnlm (EMGALITY ) 120 MG/ML SOAJ INJECT 240 MG INTO THE SKIN ONCE FOR 1 DOSE. LOADING DOSE (Patient taking differently: Inject 240 mg into the skin every 30 (thirty) days.) 1 mL 1   nitroGLYCERIN  (NITROSTAT ) 0.4 MG SL tablet Place 1 tablet (0.4 mg total) under the tongue every 5 (five) minutes x 3 doses as needed for chest pain. 25 tablet 12   pantoprazole  (PROTONIX ) 40 MG tablet Take 40 mg by mouth daily.     polyethylene glycol (MIRALAX ) 17 g packet Take 17 g by mouth daily. 30 each 11   predniSONE  (DELTASONE ) 10 MG tablet Take 2 tablets (20 mg total) by mouth daily. 14 tablet 0   sacubitril -valsartan  (ENTRESTO ) 24-26 MG TAKE 1 TABLET BY MOUTH TWICE A DAY 60 tablet 6   sertraline  (ZOLOFT ) 100 MG tablet Take 100 mg by mouth daily.     spironolactone  (ALDACTONE ) 25 MG tablet TAKE 1 TABLET (25 MG TOTAL) BY MOUTH DAILY. 90 tablet 1   Vitamin D , Ergocalciferol , (DRISDOL) 1.25 MG (50000 UNIT) CAPS capsule Take 50,000 Units by mouth once a week.     No current facility-administered medications on file prior to visit.    ALLERGIES: Allergies  Allergen Reactions   Clarithromycin Hives   Amoxicillin Palpitations    REACTION: Tacycardia   Meloxicam Rash   Spiriva Respimat [Tiotropium Bromide Monohydrate] Rash   Symbicort  [Budesonide -Formoterol  Fumarate] Rash    FAMILY HISTORY: Family History  Problem Relation Age of Onset   Diabetes Mother    Hypertension Mother    Hypothyroidism Mother    Prostate cancer Father 44   Breast cancer Maternal Aunt 50   Breast cancer Maternal Aunt 58   Breast cancer Maternal Aunt 25   Brain cancer Maternal Uncle 34   Brain cancer Maternal Uncle 69   Asthma Paternal Aunt    Prostate cancer Paternal Uncle  54   Prostate cancer Paternal Uncle 20   Prostate cancer Paternal Uncle 32   Lung cancer Maternal Grandmother 66   Eczema Daughter    Allergic rhinitis Daughter    Colon cancer Neg Hx    Colon polyps Neg Hx    Esophageal cancer Neg Hx  Objective:  *** General: No acute distress.  Patient appears well-groomed.   Head:  Normocephalic/atraumatic Eyes:  Fundi examined but not visualized Neck: supple, no paraspinal tenderness, full range of motion Heart:  Regular rate and rhythm Neurological Exam: alert and oriented to person, place, and time.  Speech fluent and not dysarthric, language intact.  CN II-XII intact. Bulk and tone normal, muscle strength 5/5 throughout.  Sensation to pinprick reduced in first 3 digits of both hands, reduced vibratory sensation in fingers.  Deep tendon reflexes 3+ in patellars but otherwise 2+ throughout, Hoffman sign absent, Babinski sign absent.  Finger to nose testing intact.  Gait normal, Romberg negative. ***   Juliene Dunnings, DO  CC: Jerel Sieving, MD

## 2024-05-18 ENCOUNTER — Telehealth: Payer: Self-pay

## 2024-05-18 ENCOUNTER — Encounter: Payer: Self-pay | Admitting: Neurology

## 2024-05-18 ENCOUNTER — Ambulatory Visit: Admitting: Neurology

## 2024-05-18 VITALS — BP 139/93 | HR 75 | Ht 61.0 in | Wt 167.0 lb

## 2024-05-18 DIAGNOSIS — M545 Low back pain, unspecified: Secondary | ICD-10-CM

## 2024-05-18 DIAGNOSIS — R202 Paresthesia of skin: Secondary | ICD-10-CM

## 2024-05-18 DIAGNOSIS — G43009 Migraine without aura, not intractable, without status migrainosus: Secondary | ICD-10-CM

## 2024-05-18 DIAGNOSIS — M792 Neuralgia and neuritis, unspecified: Secondary | ICD-10-CM

## 2024-05-18 DIAGNOSIS — G8929 Other chronic pain: Secondary | ICD-10-CM

## 2024-05-18 MED ORDER — EMGALITY 120 MG/ML ~~LOC~~ SOAJ: 120.0000 mg | SUBCUTANEOUS | 5 refills | Status: AC

## 2024-05-18 NOTE — Patient Instructions (Signed)
 Emgality  every 28 days Tylenol  as needed. Limit use of pain relievers to no more than 9 days out of the month to prevent risk of rebound or medication-overuse headache. Keep headache diary Gabapentin for scalp neuralgia

## 2024-05-18 NOTE — Therapy (Unsigned)
 OUTPATIENT PHYSICAL THERAPY THORACOLUMBAR EVALUATION   Patient Name: Mary Pratt MRN: [id number] DOB:1970-12-14, 53 y.o., female Today's Date: 05/19/2024  END OF SESSION:  PT End of Session - 05/19/24 1430     Visit Number 1    Number of Visits 13    Date for PT Re-Evaluation 06/16/24    Authorization Type [place] Medicaid [employer]    Authorization Time Period Auth requested    Progress Note Due on Visit 10    PT Start Time 1431    PT Stop Time 1514    PT Time Calculation (min) 43 min    Activity Tolerance Patient tolerated treatment well    Behavior During Therapy WFL for tasks assessed/performed          Past Medical History:  Diagnosis Date   Abnormal pap 05/2012   ASCUS + HPV, 11/14 LGSIL   Allergy     Anemia    Anxiety    Asthma    Cardiac defibrillator in place    Chronic systolic CHF (congestive heart failure) (HCC)    a. 02/2015 Echo: EF 20%, sev dil w/ diff HK, worse [place] [facility]. Tiv AI, mild MR, mod dil LA, PASP .   Clotting disorder (HCC)    DVT of lower extremity (deep venous thrombosis) (HCC)    Functional dyspepsia    early satiety    GERD with stricture    dilated 2010   HELICOBACTER PYLORI GASTRITIS 01/15/2009   dyspnea, ? reaction to Biaxin/amoxicillin  Pylera 01/15/09 - completed therapy      IBS (irritable bowel syndrome)    NICM (nonischemic cardiomyopathy) (HCC)    a. 02/2015 Cath: nl cors, EF 15%, mild PAH;  b. 02/2015 Enrolled in VEST trial.   Sickle cell trait (HCC)    Situational depression    son died   Vitamin B 12 deficiency 11/2022   Past Surgical History:  Procedure Laterality Date   BIV ICD GENERATOR CHANGEOUT N/A 01/24/2021   Procedure: BIV ICD GENERATOR CHANGEOUT;  Surgeon: Fernande Elspeth BROCKS, MD;  Location: [facility] INVASIVE CV LAB;  Service: Cardiovascular;  Laterality: N/A;   CARDIAC CATHETERIZATION N/A 02/26/2015   Procedure: Right/Left Heart Cath and Coronary Angiography;  Surgeon: Debby DELENA Sor, MD;  Location: [facility] INVASIVE  CV LAB;  Service: Cardiovascular;  Laterality: N/A;   COLPOSCOPY  09/08/2011   neg   EP IMPLANTABLE DEVICE N/A 10/14/2015   Procedure: BiV ICD Insertion CRT-D;  Surgeon: Elspeth BROCKS Fernande, MD;  Location: [facility] INVASIVE CV LAB;  Service: Cardiovascular;  Laterality: N/A;   ESOPHAGOGASTRODUODENOSCOPY (EGD) WITH ESOPHAGEAL DILATION  11/30/2008   esophageal ring dilated 54 French, H. 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   Diarrhea 08/21/2022   Right sided abdominal pain 08/21/2022   Chest pain with moderate risk for cardiac etiology 06/24/2021   ICD (implantable cardioverter-defibrillator), biventricular, in situ 12/11/2019   Fatigue 02/16/2017   Chronic systolic heart  failure (HCC) 06/12/2015   Nonischemic cardiomyopathy (HCC) 02/27/2015   Essential hypertension 02/27/2015   Cardiomyopathy (HCC)    Acute systolic heart failure (HCC) 02/22/2015   Functional dyspepsia 01/23/2011   Asthma 12/28/2007   GERD with stricture 12/28/2007    PCP: Toribio Jerel MATSU, MD  REFERRING PROVIDER: Onetha Kuba, MD  REFERRING DIAG: M51.26 (ICD-10-CM) - Other intervertebral disc displacement, lumbar region  Rationale for Evaluation and Treatment: Rehabilitation  THERAPY DIAG:  Other low back pain  Low back pain radiating to both legs  Impaired functional mobility, balance, gait, and endurance  ONSET DATE: Several months  SUBJECTIVE:                                                                                                                                                                                            SUBJECTIVE STATEMENT: Patient reports back pain has been going on for several months. Reports it as throbbing/achy mid low back. Reports it comes and goes. Sometimes it travels down BLE, but L>R usually. LE pain is Numbness/tingling. Pain can get to 7-8/10. Reports she can stand 10 minutes at most before having to sit. Reports little pain while walking. LLE feels heavier sometimes.   Reports cardiologist started her on new medication a few days ago. Unsure of name.  PERTINENT HISTORY:  DVT in January 2025, currently on blood thinners  PAIN:  Are you having pain? No  PRECAUTIONS: None  RED FLAGS: None   WEIGHT BEARING RESTRICTIONS: No  FALLS:  Has patient fallen in last 6 months? No  LIVING ENVIRONMENT: Stairs: Yes: External: 13 steps; can reach both Has following equipment at home: None  OCCUPATION: N/A  PLOF: Independent  PATIENT GOALS: To get where I can feel better and stand up longer  NEXT MD VISIT: Around October 23th  OBJECTIVE:  Note: Objective measures were completed at Evaluation unless otherwise noted.  DIAGNOSTIC FINDINGS:  IMPRESSION: CT lumbar spine:   1. In comparison to October 28, 2023 MRI lumbar spine, suspected increase in a right paracentral disc protrusion at L4-L5 with potentially moderate canal stenosis. Consider MRI of the lumbar spine to confirm and better characterize. 2. No evidence of acute fracture or malalignment.   CT abdomen/pelvis:   No evidence of acute abnormality.  PATIENT SURVEYS:  Modified Oswestry: Modified Oswestry Low Back Pain Disability Questionnaire: 35 / 50 = 70.0 %   Interpretation of scores: Score Category Description  0-20% Minimal Disability The patient can cope with most living activities. Usually no treatment is indicated apart from advice  on lifting, sitting and exercise  21-40% Moderate Disability The patient experiences more pain  and difficulty with sitting, lifting and standing. Travel and social life are more difficult and they may be disabled from work. Personal care, sexual activity and sleeping are not grossly affected, and the patient can usually be managed by conservative means  41-60% Severe Disability Pain remains the main problem in this group, but activities of daily living are affected. These patients require a detailed investigation  61-80% Crippled Back pain impinges on all aspects of the patient's life. Positive intervention is required  81-100% Bed-bound  These patients are either bed-bound or exaggerating their symptoms  Bluford FORBES Zoe DELENA Karon DELENA, et al. Surgery versus conservative management of stable thoracolumbar fracture: the PRESTO feasibility RCT. [place]): [employer]; 2021 Nov. [facility] Technology Assessment, No. 25.62.) Appendix 3, Oswestry Disability Index category descriptors. Available from: FindJewelers.cz  Minimally Clinically Important Difference (MCID) = 12.8%  COGNITION: Overall cognitive status: Within functional limits for tasks assessed     SENSATION: WFL   POSTURE: rounded shoulders, forward head, and decreased lumbar lordosis  PALPATION: TTP throughout thoracic   LUMBAR ROM:   AROM eval  Flexion (5 reps) WFL but inc pain in LE   Extension (5 reps) 25% avail, relieves LE pain but inc LBP   Right lateral flexion   Left lateral flexion   Right rotation 50% pain in R side/low back and flank area  Left rotation 50% avail, pain in R side/low back and flank area   (Blank rows = not tested)  LOWER EXTREMITY ROM:     Active  Right eval Left eval  Hip flexion    Hip extension    Hip abduction    Hip adduction    Hip internal rotation    Hip external rotation    Knee flexion    Knee extension    Ankle dorsiflexion    Ankle  plantarflexion    Ankle inversion    Ankle eversion     (Blank rows = not tested)  LOWER EXTREMITY MMT:    MMT Right eval Left eval  Hip flexion 4- pain in lower leg 4- pain in L knee  Hip extension 4-, LBP 4-, LBP  Hip abduction 4-, R sided pain 4-, LLE pain  Hip adduction    Hip internal rotation    Hip external rotation    Knee flexion    Knee extension    Ankle dorsiflexion 4+ 4+  Ankle plantarflexion    Ankle inversion    Ankle eversion     (Blank rows = not tested)  LUMBAR SPECIAL TESTS:    FUNCTIONAL TESTS:  30 seconds chair stand test 2 minute walk test: 369 ft  30 seconds chair stand: 11 STS, reports inc low back and LE pain, 3/10 pain   GAIT: Distance walked: 369 ft Assistive device utilized: None Level of assistance: Complete Independence Comments: WNL  TREATMENT DATE:  05/19/24: PT eval and HEP  PATIENT EDUCATION:  Education details: PT evaluation, objective findings, POC, Importance of HEP, Precautions, Clinic policies  Person educated: Patient Education method: Explanation and Demonstration Education comprehension: verbalized understanding and returned demonstration  HOME EXERCISE PROGRAM: Access Code: [email or web address] URL: [email or web address] Date: 05/19/2024 Prepared by: Rosaria Powell-Butler  Exercises - Lying Prone  - 2 x daily - 7 x weekly - 3 sets - 5 min hold - Supine Bridge  - 2 x daily - 7 x weekly - 3 sets - 10 reps - Standing Lumbar Extension at Wall - Forearms  - 2 x daily - 7 x weekly - 3 sets - 10 reps - Standing Hip Abduction with Counter Support  - 2 x daily - 7 x weekly - 3 sets - 10 reps  ASSESSMENT:  CLINICAL IMPRESSION: Patient is a 53 y.o. female who was seen today for physical therapy evaluation and treatment for M51.26 (ICD-10-CM) - Other intervertebral disc displacement, lumbar region.  On this date, patient demonstrates decreased self perception of function, decreased lumbar ROM, decreased LE strength, decreased activity tolerance, and impaired functional testing score. Patient experiences radiating pain down LE with repeated flexion that centralizes with repeated extension. HEP extension based for this reason with patient tolerating well. Patient will benefit from continued skilled physical therapy in order to address current deficits to improve overall function and quality of life.    OBJECTIVE IMPAIRMENTS: decreased activity tolerance, decreased endurance, decreased mobility, decreased ROM, decreased strength, improper body mechanics, postural dysfunction, and pain.   ACTIVITY LIMITATIONS: carrying, lifting, bending, sitting, standing, squatting, stairs, and transfers  PARTICIPATION LIMITATIONS: meal prep, cleaning, laundry, community activity, occupation, and yard work  PERSONAL FACTORS: N/A are also affecting patient's functional outcome.   REHAB POTENTIAL: Good  CLINICAL DECISION MAKING: Stable/uncomplicated  EVALUATION COMPLEXITY: Low   GOALS: Goals reviewed with patient? No  SHORT TERM GOALS: Target date: 06/09/24 Patient will be independent with performance of HEP to demonstrate adequate self management of symptoms.  Baseline:  Goal status: INITIAL  2.   Patient will report at least a 25% improvement with function and/or pain reduction overall since beginning PT. Baseline:  Goal status: INITIAL   LONG TERM GOALS: Target date: 07/03/24 Patient will improve Modified Oswestry score by 12.8 % in order to demonstrate improved self-perceived disability and overall function while meeting MCID.  Baseline: Goal status: INITIAL   2.  Patient will improve  30 second chair stand  test by at least 2 STS   in order to demonstrate improved LE strength/power required for functional tranfers. Baseline:  Goal status: INITIAL   3.  Patient will improve 2 minute walk  test by at least 30 ft in order to demonstrate improved LE endurance needed for community ambulation.  Baseline:  Goal status: INITIAL   4.  Patient will report overall 50% improvement since beginning PT. Baseline:  Goal status: INITIAL  5. Patient will be able to stand at least 12 minutes with reports of decreased pain, at least 4/10 or less, in order to demonstrate improved activity tolerance needed for iADLs.  Baseline:  Goal status: INITIAL   PLAN:  PT FREQUENCY: 2x/week  PT DURATION: 8 weeks  PLANNED INTERVENTIONS: 97164- PT Re-evaluation, 97110-Therapeutic exercises, 97530- Therapeutic activity, [id number]- Neuromuscular re-education, 97535- Self Care, 02859- Manual therapy, [id number]- Gait training, [phone]- Electrical stimulation (manual), [id number]- Traction (mechanical), [phone] (1-2 muscles), 20561 (3+ muscles)- Dry Needling, Patient/Family education, Balance training, Stair training, Taping, Joint mobilization, Spinal mobilization, Cryotherapy, and Moist heat.  PLAN FOR NEXT SESSION:  review HEP and goals, cont to progress lumbar mobility, LE strength, core strength   5:15 PM, June 13, 2024 Rosaria Settler, PT, DPT  Rehabilitation - [place]    Managed Medicaid Authorization Request Treatment Start Date: 13-Jun-2024  Visit Dx Codes:  M54.59 Z74.09 M54.50  M79.604  M79.605  Functional Tool Score:  Modified Oswestry: Modified Oswestry Low Back Pain Disability Questionnaire: 35 / 50 = 70.0 %  For all possible CPT codes, reference the Planned Interventions line above.     Check all conditions that are expected to impact treatment: {Conditions expected to impact treatment:None of these apply   If treatment provided at initial evaluation, no treatment charged due to lack of authorization.   \

## 2024-05-18 NOTE — Telephone Encounter (Signed)
 PA needed for [employer]

## 2024-05-19 ENCOUNTER — Other Ambulatory Visit: Payer: Self-pay

## 2024-05-19 ENCOUNTER — Telehealth: Payer: Self-pay | Admitting: Pharmacy Technician

## 2024-05-19 ENCOUNTER — Other Ambulatory Visit (HOSPITAL_COMMUNITY): Payer: Self-pay

## 2024-05-19 ENCOUNTER — Ambulatory Visit (HOSPITAL_COMMUNITY): Attending: Neurosurgery

## 2024-05-19 ENCOUNTER — Encounter (HOSPITAL_COMMUNITY): Payer: Self-pay

## 2024-05-19 DIAGNOSIS — Z7409 Other reduced mobility: Secondary | ICD-10-CM | POA: Insufficient documentation

## 2024-05-19 DIAGNOSIS — M545 Low back pain, unspecified: Secondary | ICD-10-CM | POA: Diagnosis present

## 2024-05-19 DIAGNOSIS — M79605 Pain in left leg: Secondary | ICD-10-CM | POA: Diagnosis present

## 2024-05-19 DIAGNOSIS — M5459 Other low back pain: Secondary | ICD-10-CM | POA: Insufficient documentation

## 2024-05-19 DIAGNOSIS — M79604 Pain in right leg: Secondary | ICD-10-CM | POA: Diagnosis present

## 2024-05-19 NOTE — Telephone Encounter (Signed)
 Pharmacy Patient Advocate Encounter   Received notification from Pt Calls Messages that prior authorization for EMGALITY  120MG   is required/requested.   Insurance verification completed.   The patient is insured through [employer] .   Per test claim: PA required; PA submitted to above mentioned insurance via Latent Key/confirmation #/EOC [id number] Status is pending

## 2024-05-19 NOTE — Telephone Encounter (Signed)
 Pharmacy Patient Advocate Encounter  Received notification from [employer] that Prior Authorization for EMGALITY  120MG  has been APPROVED from 9.12.25 to 12.11.25. Ran test claim, Copay is $4. This test claim was processed through [facility]- copay amounts may vary at other pharmacies due to pharmacy/plan contracts, or as the patient moves through the different stages of their insurance plan.   PA #/Case ID/Reference #: [id number]

## 2024-05-22 ENCOUNTER — Other Ambulatory Visit (HOSPITAL_COMMUNITY): Payer: Self-pay

## 2024-05-22 ENCOUNTER — Encounter: Payer: Self-pay | Admitting: Nurse Practitioner

## 2024-05-24 ENCOUNTER — Other Ambulatory Visit: Payer: Self-pay | Admitting: Nurse Practitioner

## 2024-05-24 ENCOUNTER — Ambulatory Visit (HOSPITAL_COMMUNITY): Admitting: Physical Therapy

## 2024-05-24 DIAGNOSIS — M79604 Pain in right leg: Secondary | ICD-10-CM

## 2024-05-24 DIAGNOSIS — R700 Elevated erythrocyte sedimentation rate: Secondary | ICD-10-CM

## 2024-05-24 DIAGNOSIS — M5459 Other low back pain: Secondary | ICD-10-CM | POA: Diagnosis not present

## 2024-05-24 DIAGNOSIS — Z7409 Other reduced mobility: Secondary | ICD-10-CM

## 2024-05-24 NOTE — Therapy (Signed)
 OUTPATIENT PHYSICAL THERAPY THORACOLUMBAR EVALUATION   Patient Name: Mary Pratt MRN: [id number] DOB:1971-08-27, 53 y.o., female Today's Date: 05/24/2024  END OF SESSION:  PT End of Session - 05/24/24 1348     Visit Number 2    Number of Visits 13    Date for PT Re-Evaluation 06/16/24    Authorization Type [place] Medicaid [employer]    Authorization Time Period Auth requested    Progress Note Due on Visit 10    PT Start Time 1348    PT Stop Time 1416    PT Time Calculation (min) 28 min    Activity Tolerance Patient tolerated treatment well    Behavior During Therapy WFL for tasks assessed/performed          Past Medical History:  Diagnosis Date   Abnormal pap 05/2012   ASCUS + HPV, 11/14 LGSIL   Allergy     Anemia    Anxiety    Asthma    Cardiac defibrillator in place    Chronic systolic CHF (congestive heart failure) (HCC)    a. 02/2015 Echo: EF 20%, sev dil w/ diff HK, worse [place] inf [facility]. Tiv AI, mild MR, mod dil LA, PASP .   Clotting disorder (HCC)    DVT of lower extremity (deep venous thrombosis) (HCC)    Functional dyspepsia    early satiety    GERD with stricture    dilated 2010   HELICOBACTER PYLORI GASTRITIS 01/15/2009   dyspnea, ? reaction to Biaxin/amoxicillin  Pylera 01/15/09 - completed therapy      IBS (irritable bowel syndrome)    NICM (nonischemic cardiomyopathy) (HCC)    a. 02/2015 Cath: nl cors, EF 15%, mild PAH;  b. 02/2015 Enrolled in VEST trial.   Sickle cell trait (HCC)    Situational depression    son died   Vitamin B 12 deficiency 11/2022   Past Surgical History:  Procedure Laterality Date   BIV ICD GENERATOR CHANGEOUT N/A 01/24/2021   Procedure: BIV ICD GENERATOR CHANGEOUT;  Surgeon: Fernande Elspeth BROCKS, MD;  Location: [facility] INVASIVE CV LAB;  Service: Cardiovascular;  Laterality: N/A;   CARDIAC CATHETERIZATION N/A 02/26/2015   Procedure: Right/Left Heart Cath and Coronary Angiography;  Surgeon: Debby DELENA Sor, MD;  Location: [facility] INVASIVE  CV LAB;  Service: Cardiovascular;  Laterality: N/A;   COLPOSCOPY  09/08/2011   neg   EP IMPLANTABLE DEVICE N/A 10/14/2015   Procedure: BiV ICD Insertion CRT-D;  Surgeon: Elspeth BROCKS Fernande, MD;  Location: [facility] INVASIVE CV LAB;  Service: Cardiovascular;  Laterality: N/A;   ESOPHAGOGASTRODUODENOSCOPY (EGD) WITH ESOPHAGEAL DILATION  11/30/2008   esophageal ring dilated 54 [place], H. 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   Diarrhea 08/21/2022   Right sided abdominal pain 08/21/2022   Chest pain with moderate risk for cardiac etiology 06/24/2021   ICD (implantable cardioverter-defibrillator), biventricular, in situ 12/11/2019   Fatigue 02/16/2017   Chronic systolic heart  failure (HCC) 06/12/2015   Nonischemic cardiomyopathy (HCC) 02/27/2015   Essential hypertension 02/27/2015   Cardiomyopathy (HCC)    Acute systolic heart failure (HCC) 02/22/2015   Functional dyspepsia 01/23/2011   Asthma 12/28/2007   GERD with stricture 12/28/2007    PCP: Toribio Jerel MATSU, MD  REFERRING PROVIDER: Onetha Kuba, MD  REFERRING DIAG: M51.26 (ICD-10-CM) - Other intervertebral disc displacement, lumbar region  Rationale for Evaluation and Treatment: Rehabilitation  THERAPY DIAG:  Other low back pain  Low back pain radiating to both legs  Impaired functional mobility, balance, gait, and endurance  ONSET DATE: Several months  SUBJECTIVE:                                                                                                                                                                                            SUBJECTIVE STATEMENT: Patient reports Rt sided LBP.  Reports doing her HEP one time since was last here (5 days).  7/10 pain   Reports cardiologist started her on new medication a few days ago. Unsure of name.  PERTINENT HISTORY:  DVT in January 2025, currently on blood thinners  PAIN:  Are you having pain? No and Yes: NPRS scale: 7/10 Pain location: Rt lower back and hip Pain description: achey, sometimes sharp Aggravating factors: walking, standing Relieving factors: rest  PRECAUTIONS: None  RED FLAGS: None   WEIGHT BEARING RESTRICTIONS: No  FALLS:  Has patient fallen in last 6 months? No  LIVING ENVIRONMENT: Stairs: Yes: External: 13 steps; can reach both Has following equipment at home: None  OCCUPATION: N/A  PLOF: Independent  PATIENT GOALS: To get where I can feel better and stand up longer  NEXT MD VISIT: Around October 23th  OBJECTIVE:  Note: Objective measures were completed at Evaluation unless otherwise noted.  DIAGNOSTIC FINDINGS:  IMPRESSION: CT lumbar spine:   1. In comparison to October 28, 2023 MRI lumbar spine, suspected increase in a right paracentral disc protrusion at L4-L5 with potentially moderate canal stenosis. Consider MRI of the lumbar spine to confirm and better characterize. 2. No evidence of acute fracture or malalignment.   CT abdomen/pelvis:   No evidence of acute abnormality.  PATIENT SURVEYS:  Modified Oswestry: Modified Oswestry Low Back Pain Disability Questionnaire: 35 / 50 = 70.0 %   Interpretation of scores: Score Category Description  0-20% Minimal Disability The patient can cope with most living activities. Usually no treatment is indicated apart from advice on lifting, sitting and exercise  21-40% Moderate Disability The patient experiences  more pain and difficulty with sitting, lifting and standing. Travel and social life are more difficult and they  may be disabled from work. Personal care, sexual activity and sleeping are not grossly affected, and the patient can usually be managed by conservative means  41-60% Severe Disability Pain remains the main problem in this group, but activities of daily living are affected. These patients require a detailed investigation  61-80% Crippled Back pain impinges on all aspects of the patient's life. Positive intervention is required  81-100% Bed-bound  These patients are either bed-bound or exaggerating their symptoms  Bluford FORBES Zoe DELENA Karon DELENA, et al. Surgery versus conservative management of stable thoracolumbar fracture: the PRESTO feasibility RCT. [place] ([place]): [employer]; 2021 Nov. [facility] Technology Assessment, No. 25.62.) Appendix 3, Oswestry Disability Index category descriptors. Available from: FindJewelers.cz  Minimally Clinically Important Difference (MCID) = 12.8%  COGNITION: Overall cognitive status: Within functional limits for tasks assessed     SENSATION: WFL   POSTURE: rounded shoulders, forward head, and decreased lumbar lordosis  PALPATION: TTP throughout thoracic   LUMBAR ROM:   AROM eval  Flexion (5 reps) WFL but inc pain in LE   Extension (5 reps) 25% avail, relieves LE pain but inc LBP   Right lateral flexion   Left lateral flexion   Right rotation 50% pain in R side/low back and flank area  Left rotation 50% avail, pain in R side/low back and flank area   (Blank rows = not tested)   LOWER EXTREMITY MMT:    MMT Right eval Left eval  Hip flexion 4- pain in lower leg 4- pain in L knee  Hip extension 4-, LBP 4-, LBP  Hip abduction 4-, R sided pain 4-, LLE pain  Hip adduction    Hip internal rotation    Hip external rotation    Knee flexion    Knee extension    Ankle dorsiflexion 4+ 4+  Ankle  plantarflexion    Ankle inversion    Ankle eversion     (Blank rows = not tested)  LUMBAR SPECIAL TESTS:    FUNCTIONAL TESTS:  30 seconds chair stand test 2 minute walk test: 369 ft  30 seconds chair stand: 11 STS, reports inc low back and LE pain, 3/10 pain   GAIT: Distance walked: 369 ft Assistive device utilized: None Level of assistance: Complete Independence Comments: WNL  TREATMENT DATE:  05/24/24 Standing:  hip abduction 2X10  Back against counter lumbar extension 2X10 Supine:  bridge 2X10  SLR 2X10 each  Piriformis stretch 2X30 each side  Hamstring stretch with towel 2X30 Prone lying 1 minute  Press ups 10X  Heelqueezes 10X5 Seated sit to stands no UE Assist 10X   05/19/24: PT eval and HEP                                                                                                                                 PATIENT EDUCATION:  Education details: PT evaluation, objective findings, POC, Importance of HEP, Precautions, Clinic policies  Person  educated: Patient Education method: Medical illustrator Education comprehension: verbalized understanding and returned demonstration  HOME EXERCISE PROGRAM: Access Code: [email or web address] URL: [email or web address] Date: 05/19/2024 Prepared by: Rosaria Powell-Butler  Exercises - Lying Prone  - 2 x daily - 7 x weekly - 3 sets - 5 min hold - Supine Bridge  - 2 x daily - 7 x weekly - 3 sets - 10 reps - Standing Lumbar Extension at Wall - Forearms  - 2 x daily - 7 x weekly - 3 sets - 10 reps - Standing Hip Abduction with Counter Support  - 2 x daily - 7 x weekly - 3 sets - 10 reps  ASSESSMENT:  CLINICAL IMPRESSION: Reviewed goals and POC moving forward.  Pt able to recall and complete established HEP correctly.  Pt did have difficultly maintaining upright posturing with hip abduction activity with far lateral lean, low stabilization.  Began prone press ups and heel squeezes to work on posterior mm.   Did not update HEP this session, instructions to become more compliant with established therex so far.  Pt without any c/o pain during or at completion of session.  Patient will benefit from continued skilled physical therapy in order to address current deficits to improve overall function and quality of life.    OBJECTIVE IMPAIRMENTS: decreased activity tolerance, decreased endurance, decreased mobility, decreased ROM, decreased strength, improper body mechanics, postural dysfunction, and pain.   ACTIVITY LIMITATIONS: carrying, lifting, bending, sitting, standing, squatting, stairs, and transfers  PARTICIPATION LIMITATIONS: meal prep, cleaning, laundry, community activity, occupation, and yard work  PERSONAL FACTORS: N/A are also affecting patient's functional outcome.   REHAB POTENTIAL: Good  CLINICAL DECISION MAKING: Stable/uncomplicated  EVALUATION COMPLEXITY: Low   GOALS: Goals reviewed with patient? Yes  SHORT TERM GOALS: Target date: 06/09/24 Patient will be independent with performance of HEP to demonstrate adequate self management of symptoms.  Baseline:  Goal status: INITIAL  2.   Patient will report at least a 25% improvement with function and/or pain reduction overall since beginning PT. Baseline:  Goal status: INITIAL   LONG TERM GOALS: Target date: 07/03/24 Patient will improve Modified Oswestry score by 12.8 % in order to demonstrate improved self-perceived disability and overall function while meeting MCID.  Baseline: Goal status: INITIAL   2.  Patient will improve  30 second chair stand  test by at least 2 STS   in order to demonstrate improved LE strength/power required for functional tranfers. Baseline:  Goal status: INITIAL   3.  Patient will improve 2 minute walk test by at least 30 ft in order to demonstrate improved LE endurance needed for community ambulation.  Baseline:  Goal status: INITIAL   4.  Patient will report overall 50% improvement since  beginning PT. Baseline:  Goal status: INITIAL  5. Patient will be able to stand at least 12 minutes with reports of decreased pain, at least 4/10 or less, in order to demonstrate improved activity tolerance needed for iADLs.  Baseline:  Goal status: INITIAL   PLAN:  PT FREQUENCY: 2x/week  PT DURATION: 8 weeks  PLANNED INTERVENTIONS: 97164- PT Re-evaluation, 97110-Therapeutic exercises, 97530- Therapeutic activity, [id number]- Neuromuscular re-education, 97535- Self Care, [id number]- Manual therapy, [id number]- Gait training, [phone]- Electrical stimulation (manual), [id number]- Traction (mechanical), [phone] (1-2 muscles), [id number] (3+ muscles)- Dry Needling, Patient/Family education, Balance training, Stair training, Taping, Joint mobilization, Spinal mobilization, Cryotherapy, and Moist heat.  PLAN FOR NEXT SESSION: Cont to progress lumbar mobility, LE strength, core strength   2:36 PM, 05/24/24 Joelee Snoke  KATHEE Fuse, PTA/CLT [facility] Ph: [phone]

## 2024-05-25 ENCOUNTER — Other Ambulatory Visit (HOSPITAL_COMMUNITY)
Admission: RE | Admit: 2024-05-25 | Discharge: 2024-05-25 | Disposition: A | Discharge status: Home / Self Care | Source: Ambulatory Visit | Attending: Nurse Practitioner | Admitting: Nurse Practitioner

## 2024-05-25 DIAGNOSIS — Z13220 Encounter for screening for lipoid disorders: Secondary | ICD-10-CM | POA: Insufficient documentation

## 2024-05-25 LAB — LIPID PANEL
Cholesterol: 189 mg/dL (ref 0–200)
HDL: 65 mg/dL (ref >40)
LDL Cholesterol: 96 mg/dL (ref 0–99)
Total CHOL/HDL Ratio: 2.9 ratio
Triglycerides: 139 mg/dL (ref <150)
VLDL: 28 mg/dL (ref 0–40)

## 2024-05-29 ENCOUNTER — Ambulatory Visit: Payer: Self-pay | Admitting: Nurse Practitioner

## 2024-05-29 ENCOUNTER — Ambulatory Visit (HOSPITAL_COMMUNITY)

## 2024-05-29 ENCOUNTER — Encounter: Payer: Self-pay | Admitting: Hematology

## 2024-05-29 DIAGNOSIS — M5459 Other low back pain: Secondary | ICD-10-CM | POA: Diagnosis not present

## 2024-05-29 DIAGNOSIS — Z7409 Other reduced mobility: Secondary | ICD-10-CM

## 2024-05-29 DIAGNOSIS — M79605 Pain in left leg: Secondary | ICD-10-CM

## 2024-05-29 NOTE — Therapy (Signed)
 OUTPATIENT PHYSICAL THERAPY THORACOLUMBAR TREATMENT   Patient Name: Mary Pratt MRN: [id number] DOB:1971/02/26, 53 y.o., female Today's Date: 05/29/2024  END OF SESSION:  PT End of Session - 05/29/24 1414     Visit Number 3    Number of Visits 13    Date for Recertification  06/16/24    Authorization Type [place] [employer]    Authorization Time Period Auth requested    Progress Note Due on Visit 10    PT Start Time 1414    PT Stop Time 1454    PT Time Calculation (min) 40 min    Activity Tolerance Patient tolerated treatment well    Behavior During Therapy WFL for tasks assessed/performed          Past Medical History:  Diagnosis Date   Abnormal pap 05/2012   ASCUS + HPV, 11/14 LGSIL   Allergy     Anemia    Anxiety    Asthma    Cardiac defibrillator in place    Chronic systolic CHF (congestive heart failure) (HCC)    a. 02/2015 Echo: EF 20%, sev dil w/ diff HK, worse @ inf base. Tiv AI, mild MR, mod dil LA, PASP .   Clotting disorder    DVT of lower extremity (deep venous thrombosis) (HCC)    Functional dyspepsia    early satiety    GERD with stricture    dilated 2010   HELICOBACTER PYLORI GASTRITIS 01/15/2009   dyspnea, ? reaction to Biaxin/amoxicillin  Pylera 01/15/09 - completed therapy      IBS (irritable bowel syndrome)    NICM (nonischemic cardiomyopathy) (HCC)    a. 02/2015 Cath: nl cors, EF 15%, mild PAH;  b. 02/2015 Enrolled in VEST trial.   Sickle cell trait    Situational depression    son died   Vitamin B 12 deficiency 11/2022   Past Surgical History:  Procedure Laterality Date   BIV ICD GENERATOR CHANGEOUT N/A 01/24/2021   Procedure: BIV ICD GENERATOR CHANGEOUT;  Surgeon: Fernande Elspeth BROCKS, MD;  Location: [facility] INVASIVE CV LAB;  Service: Cardiovascular;  Laterality: N/A;   CARDIAC CATHETERIZATION N/A 02/26/2015   Procedure: Right/Left Heart Cath and Coronary Angiography;  Surgeon: Debby DELENA Sor, MD;  Location: [facility] INVASIVE CV LAB;   Service: Cardiovascular;  Laterality: N/A;   COLPOSCOPY  09/08/2011   neg   EP IMPLANTABLE DEVICE N/A 10/14/2015   Procedure: BiV ICD Insertion CRT-D;  Surgeon: Elspeth BROCKS Fernande, MD;  Location: [facility] INV[facility] CV LAB;  Service: Cardiovascular;  Laterality: N/A;   ESOPHAGOGASTRODUODENOSCOPY (EGD) WITH ESOPHAGEAL DILATION  11/30/2008   esophageal ring dilated 54 French, H. 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   Diarrhea 08/21/2022   Right sided abdominal pain 08/21/2022   Chest pain with moderate risk for cardiac etiology 06/24/2021   ICD (implantable cardioverter-defibrillator), biventricular, in situ 12/11/2019   Fatigue 02/16/2017   Chronic systolic heart failure (HCC) 06/12/2015  Nonischemic cardiomyopathy (HCC) 02/27/2015   Essential hypertension 02/27/2015   Cardiomyopathy (HCC)    Acute systolic heart failure (HCC) 02/22/2015   Functional dyspepsia 01/23/2011   Asthma 12/28/2007   GERD with stricture 12/28/2007    PCP: Toribio Jerel MATSU, MD  REFERRING PROVIDER: Onetha Kuba, MD  REFERRING DIAG: M51.26 (ICD-10-CM) - Other intervertebral disc displacement, lumbar region  Rationale for Evaluation and Treatment: Rehabilitation  THERAPY DIAG:  Other low back pain  Low back pain radiating to both legs  Impaired functional mobility, balance, gait, and endurance  ONSET DATE: Several months  SUBJECTIVE:                                                                                                                                                                                            SUBJECTIVE STATEMENT: 4/10 pain today; still more on the right side.  Goes down to about mid glute   Reports cardiologist started her on new medication a few days ago. Unsure of name.  PERTINENT HISTORY:  DVT in January 2025, currently on blood thinners  PAIN:  Are you having pain? No and Yes: NPRS scale: 7/10 Pain location: Rt lower back and hip Pain description: achey, sometimes sharp Aggravating factors: walking, standing Relieving factors: rest  PRECAUTIONS: None  RED FLAGS: None   WEIGHT BEARING RESTRICTIONS: No  FALLS:  Has patient fallen in last 6 months? No  LIVING ENVIRONMENT: Stairs: Yes: External: 13 steps; can reach both Has following equipment at home: None  OCCUPATION: N/A  PLOF: Independent  PATIENT GOALS: To get where I can feel better and stand up longer  NEXT MD VISIT: Around October 23th  OBJECTIVE:  Note: Objective measures were completed at Evaluation unless otherwise noted.  DIAGNOSTIC FINDINGS:  IMPRESSION: CT lumbar spine:   1. In comparison to October 28, 2023 MRI lumbar spine, suspected increase in a right paracentral disc protrusion at L4-L5 with potentially moderate canal stenosis. Consider MRI of the lumbar spine to confirm and better characterize. 2. No evidence of acute fracture or malalignment.   CT abdomen/pelvis:   No evidence of acute abnormality.  PATIENT SURVEYS:  Modified Oswestry: Modified Oswestry Low Back Pain Disability Questionnaire: 35 / 50 = 70.0 %   Interpretation of scores: Score Category Description  0-20% Minimal Disability The patient can cope with most living activities. Usually no treatment is indicated apart from advice on lifting, sitting and exercise  21-40% Moderate Disability The patient experiences more pain and difficulty with sitting,  lifting and standing. Travel and social life are more difficult and they may be disabled from work. Personal care, sexual activity and  sleeping are not grossly affected, and the patient can usually be managed by conservative means  41-60% Severe Disability Pain remains the main problem in this group, but activities of daily living are affected. These patients require a detailed investigation  61-80% Crippled Back pain impinges on all aspects of the patient's life. Positive intervention is required  81-100% Bed-bound  These patients are either bed-bound or exaggerating their symptoms  Bluford FORBES Zoe DELENA Karon DELENA, et al. Surgery versus conservative management of stable thoracolumbar fracture: the PRESTO feasibility RCT. [place]): [employer]; 2021 Nov. [facility] Technology Assessment, No. 25.62.) Appendix 3, Oswestry Disability Index category descriptors. Available from: FindJewelers.cz  Minimally Clinically Important Difference (MCID) = 12.8%  COGNITION: Overall cognitive status: Within functional limits for tasks assessed     SENSATION: WFL   POSTURE: rounded shoulders, forward head, and decreased lumbar lordosis  PALPATION: TTP throughout thoracic   LUMBAR ROM:   AROM eval  Flexion (5 reps) WFL but inc pain in LE   Extension (5 reps) 25% avail, relieves LE pain but inc LBP   Right lateral flexion   Left lateral flexion   Right rotation 50% pain in R side/low back and flank area  Left rotation 50% avail, pain in R side/low back and flank area   (Blank rows = not tested)   LOWER EXTREMITY MMT:    MMT Right eval Left eval  Hip flexion 4- pain in lower leg 4- pain in L knee  Hip extension 4-, LBP 4-, LBP  Hip abduction 4-, R sided pain 4-, LLE pain  Hip adduction    Hip internal rotation    Hip external rotation    Knee flexion    Knee extension    Ankle dorsiflexion 4+ 4+  Ankle plantarflexion    Ankle inversion    Ankle  eversion     (Blank rows = not tested)  LUMBAR SPECIAL TESTS:    FUNCTIONAL TESTS:  30 seconds chair stand test 2 minute walk test: 369 ft  30 seconds chair stand: 11 STS, reports inc low back and LE pain, 3/10 pain   GAIT: Distance walked: 369 ft Assistive device utilized: None Level of assistance: Complete Independence Comments: WNL  TREATMENT DATE:  05/29/24 Nustep seat 6 x 6' level 2 dynamic warm up Prone lying x 1' Trial of prone on elbows Trial of prone lying with hip shift to the left (decreased pain to 3/10 more central) Stayed in above position glute sets 5 x 10 Supine: Hooklying position for relaxation (no pain in back) Abdominal bracing 5 hold x 10 Abdominal bracing 5hold with ball squeeze hip adduction x 10 Abdominal bracing 5 hold with belt hip abduction x 10 Standing lateral wall shifts hips moving to the left Seated thoracic extension 2 x 10  Seated scapular retractions 2 x 10 Updated HEP    05/24/24 Standing:  hip abduction 2X10  Back against counter lumbar extension 2X10 Supine:  bridge 2X10  SLR 2X10 each  Piriformis stretch 2X30 each side  Hamstring stretch with towel 2X30 Prone lying 1 minute  Press ups 10X  Heelqueezes 10X5 Seated sit to stands no UE Assist 10X   05/19/24: PT eval and HEP  PATIENT EDUCATION:  Education details: PT evaluation, objective findings, POC, Importance of HEP, Precautions, Clinic policies  Person educated: Patient Education method: Explanation and Demonstration Education comprehension: verbalized understanding and returned demonstration  HOME EXERCISE PROGRAM: Access Code: [email or web address] URL: [email or web address] Date: 05/19/2024 Prepared by: Rosaria Powell-Butler  Exercises - Lying Prone  - 2 x daily - 7 x weekly - 3 sets - 5 min hold - Supine Bridge  - 2 x daily - 7  x weekly - 3 sets - 10 reps - Standing Lumbar Extension at Wall - Forearms  - 2 x daily - 7 x weekly - 3 sets - 10 reps - Standing Hip Abduction with Counter Support  - 2 x daily - 7 x weekly - 3 sets - 10 reps  ASSESSMENT:  CLINICAL IMPRESSION:  Trial today of positional testing to try to centralize pain; least painful with prone lying with lateral shift but minimal changes.  Tried standing lateral shift for increased force but after treatment overall pain increased to 5/10.  Worked on core strengthening adding abdominal bracing and updated HEP.  Patient needs frequent cues for correct breathing.  She also complains of upper lumbar and lower thoracic pain some during treatment today.  Trial of seated extensions helped minimally.   Patient will benefit from continued skilled physical therapy in order to address current deficits to improve overall function and quality of life.    OBJECTIVE IMPAIRMENTS: decreased activity tolerance, decreased endurance, decreased mobility, decreased ROM, decreased strength, improper body mechanics, postural dysfunction, and pain.   ACTIVITY LIMITATIONS: carrying, lifting, bending, sitting, standing, squatting, stairs, and transfers  PARTICIPATION LIMITATIONS: meal prep, cleaning, laundry, community activity, occupation, and yard work  PERSONAL FACTORS: N/A are also affecting patient's functional outcome.   REHAB POTENTIAL: Good  CLINICAL DECISION MAKING: Stable/uncomplicated  EVALUATION COMPLEXITY: Low   GOALS: Goals reviewed with patient? Yes  SHORT TERM GOALS: Target date: 06/09/24 Patient will be independent with performance of HEP to demonstrate adequate self management of symptoms.  Baseline:  Goal status: INITIAL  2.   Patient will report at least a 25% improvement with function and/or pain reduction overall since beginning PT. Baseline:  Goal status: INITIAL   LONG TERM GOALS: Target date: 07/03/24 Patient will improve Modified Oswestry  score by 12.8 % in order to demonstrate improved self-perceived disability and overall function while meeting MCID.  Baseline: Goal status: INITIAL   2.  Patient will improve  30 second chair stand  test by at least 2 STS   in order to demonstrate improved LE strength/power required for functional tranfers. Baseline:  Goal status: INITIAL   3.  Patient will improve 2 minute walk test by at least 30 ft in order to demonstrate improved LE endurance needed for community ambulation.  Baseline:  Goal status: INITIAL   4.  Patient will report overall 50% improvement since beginning PT. Baseline:  Goal status: INITIAL  5. Patient will be able to stand at least 12 minutes with reports of decreased pain, at least 4/10 or less, in order to demonstrate improved activity tolerance needed for iADLs.  Baseline:  Goal status: INITIAL   PLAN:  PT FREQUENCY: 2x/week  PT DURATION: 8 weeks  PLANNED INTERVENTIONS: 97164- PT Re-evaluation, 97110-Therapeutic exercises, 97530- Therapeutic activity, [id number]- Neuromuscular re-education, 97535- Self Care, [id number]- Manual therapy, [id number]- Gait training, [id number]- Electrical stimulation (manual), [id number]- Traction (mechanical), [phone] (1-2 muscles), [id number] (3+ muscles)- Dry Needling, Patient/Family education, Balance training, Stair training, Taping, Joint mobilization, Spinal mobilization, Cryotherapy, and  Moist heat.  PLAN FOR NEXT SESSION: Cont to progress lumbar mobility, LE strength, core strength  2:56 PM, 05/29/24 Roxsana Riding Small Chrysten Woulfe MPT [facility] [place] [phone]

## 2024-05-30 ENCOUNTER — Telehealth: Payer: Self-pay | Admitting: Nurse Practitioner

## 2024-05-30 ENCOUNTER — Telehealth: Payer: Self-pay

## 2024-05-30 ENCOUNTER — Encounter: Payer: Self-pay | Admitting: Hematology

## 2024-05-30 NOTE — Telephone Encounter (Signed)
 Pt called stating that she never heard back from her MyChart messages she sent to Powell Lessen, NP therefore she's calling to speak with [facility].  Stated that Powell is out of the office today and I will try to let the providers in the office aware of the pt's call.  Also, recommended the pt schedule an appt as well after pt starting stating symptoms she's having at home.  Pt agreed to be seen regarding her symptoms.  Transferred pt to Dr. Demetra scheduler so the pt can be scheduled.  Notified Dr. Lanny and her Team of the pt's call.

## 2024-05-30 NOTE — Telephone Encounter (Signed)
 Called is to get scheduled

## 2024-05-31 ENCOUNTER — Encounter (HOSPITAL_COMMUNITY): Admitting: Physical Therapy

## 2024-05-31 NOTE — Progress Notes (Signed)
Remote ICD Transmission.

## 2024-06-01 ENCOUNTER — Other Ambulatory Visit: Payer: Self-pay

## 2024-06-02 ENCOUNTER — Ambulatory Visit: Admitting: Nurse Practitioner

## 2024-06-05 ENCOUNTER — Ambulatory Visit (HOSPITAL_COMMUNITY)

## 2024-06-05 DIAGNOSIS — Z7409 Other reduced mobility: Secondary | ICD-10-CM

## 2024-06-05 DIAGNOSIS — M5459 Other low back pain: Secondary | ICD-10-CM | POA: Diagnosis not present

## 2024-06-05 DIAGNOSIS — M79605 Pain in left leg: Secondary | ICD-10-CM

## 2024-06-05 NOTE — Therapy (Addendum)
 OUTPATIENT PHYSICAL THERAPY THORACOLUMBAR TREATMENT   Patient Name: Mary Pratt MRN: [id number] DOB:04/17/1971, 53 y.o., female Today's Date: 06/05/2024  END OF SESSION:  PT End of Session - 06/05/24 1414     Visit Number 4    Number of Visits 13    Date for Recertification  06/16/24    Authorization Type [place] [employer]    Authorization Time Period Auth requested    Progress Note Due on Visit 10    PT Start Time 1414    PT Stop Time 1454    PT Time Calculation (min) 40 min    Activity Tolerance Patient tolerated treatment well    Behavior During Therapy WFL for tasks assessed/performed          Past Medical History:  Diagnosis Date   Abnormal pap 05/2012   ASCUS + HPV, 11/14 LGSIL   Allergy     Anemia    Anxiety    Asthma    Cardiac defibrillator in place    Chronic systolic CHF (congestive heart failure) (HCC)    a. 02/2015 Echo: EF 20%, sev dil w/ diff HK, worse @ inf base. Tiv AI, mild MR, mod dil LA, PASP .   Clotting disorder    DVT of lower extremity (deep venous thrombosis) (HCC)    Functional dyspepsia    early satiety    GERD with stricture    dilated 2010   HELICOBACTER PYLORI GASTRITIS 01/15/2009   dyspnea, ? reaction to Biaxin/amoxicillin  Pylera 01/15/09 - completed therapy      IBS (irritable bowel syndrome)    NICM (nonischemic cardiomyopathy) (HCC)    a. 02/2015 Cath: nl cors, EF 15%, mild PAH;  b. 02/2015 Enrolled in VEST trial.   Sickle cell trait    Situational depression    son died   Vitamin B 12 deficiency 11/2022   Past Surgical History:  Procedure Laterality Date   BIV ICD GENERATOR CHANGEOUT N/A 01/24/2021   Procedure: BIV ICD GENERATOR CHANGEOUT;  Surgeon: Fernande Elspeth BROCKS, MD;  Location: [facility] INVASIVE CV LAB;  Service: Cardiovascular;  Laterality: N/A;   CARDIAC CATHETERIZATION N/A 02/26/2015   Procedure: Right/Left Heart Cath and Coronary Angiography;  Surgeon: Debby DELENA Sor, MD;  Location: [facility] INVASIVE CV LAB;   Service: Cardiovascular;  Laterality: N/A;   COLPOSCOPY  09/08/2011   neg   EP IMPLANTABLE DEVICE N/A 10/14/2015   Procedure: BiV ICD Insertion CRT-D;  Surgeon: Elspeth BROCKS Fernande, MD;  Location: [facility] INVASIVE CV LAB;  Service: Cardiovascular;  Laterality: N/A;   ESOPHAGOGASTRODUODENOSCOPY (EGD) WITH ESOPHAGEAL DILATION  11/30/2008   esophageal ring dilated 54 French, H. 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   Diarrhea 08/21/2022   Right sided abdominal pain 08/21/2022   Chest pain with moderate risk for cardiac etiology 06/24/2021   ICD (implantable cardioverter-defibrillator), biventricular, in situ 12/11/2019   Fatigue 02/16/2017   Chronic systolic heart failure (HCC) 06/12/2015  Nonischemic cardiomyopathy (HCC) 02/27/2015   Essential hypertension 02/27/2015   Cardiomyopathy (HCC)    Acute systolic heart failure (HCC) 02/22/2015   Functional dyspepsia 01/23/2011   Asthma 12/28/2007   GERD with stricture 12/28/2007    PCP: Toribio Jerel MATSU, MD  REFERRING PROVIDER: Onetha Kuba, MD  REFERRING DIAG: M51.26 (ICD-10-CM) - Other intervertebral disc displacement, lumbar region  Rationale for Evaluation and Treatment: Rehabilitation  THERAPY DIAG:  Other low back pain  Low back pain radiating to both legs  Impaired functional mobility, balance, gait, and endurance  ONSET DATE: Several months  SUBJECTIVE:                                                                                                                                                                                            SUBJECTIVE STATEMENT: Back feels about the same; pain 4/10 and more on the right side.  Left knee is bothering her today. Sees hematologist tomorrow   Reports cardiologist started her on new medication a few days ago. Unsure of name.  PERTINENT HISTORY:  DVT in January 2025, currently on blood thinners  PAIN:  Are you having pain? No and Yes: NPRS scale: 7/10 Pain location: Rt lower back and hip Pain description: achey, sometimes sharp Aggravating factors: walking, standing Relieving factors: rest  PRECAUTIONS: None  RED FLAGS: None   WEIGHT BEARING RESTRICTIONS: No  FALLS:  Has patient fallen in last 6 months? No  LIVING ENVIRONMENT: Stairs: Yes: External: 13 steps; can reach both Has following equipment at home: None  OCCUPATION: N/A  PLOF: Independent  PATIENT GOALS: To get where I can feel better and stand up longer  NEXT MD VISIT: Around October 23th  OBJECTIVE:  Note: Objective measures were completed at Evaluation unless otherwise noted.  DIAGNOSTIC FINDINGS:  IMPRESSION: CT lumbar spine:   1. In comparison to October 28, 2023 MRI lumbar spine, suspected increase in a right paracentral disc protrusion at L4-L5 with potentially moderate canal stenosis. Consider MRI of the lumbar spine to confirm and better characterize. 2. No evidence of acute fracture or malalignment.   CT abdomen/pelvis:   No evidence of acute abnormality.  PATIENT SURVEYS:  Modified Oswestry: Modified Oswestry Low Back Pain Disability Questionnaire: 35 / 50 = 70.0 %   Interpretation of scores: Score Category Description  0-20% Minimal Disability The patient can cope with most living activities. Usually no treatment is indicated apart from advice on lifting, sitting and exercise  21-40% Moderate Disability The patient  experiences more pain and difficulty with sitting, lifting and standing. Travel and social life are more difficult and they may be disabled  from work. Personal care, sexual activity and sleeping are not grossly affected, and the patient can usually be managed by conservative means  41-60% Severe Disability Pain remains the main problem in this group, but activities of daily living are affected. These patients require a detailed investigation  61-80% Crippled Back pain impinges on all aspects of the patient's life. Positive intervention is required  81-100% Bed-bound  These patients are either bed-bound or exaggerating their symptoms  Bluford FORBES Zoe DELENA Karon DELENA, et al. Surgery versus conservative management of stable thoracolumbar fracture: the PRESTO feasibility RCT. [place] ([place]): [employer]; 2021 Nov. [facility] Technology Assessment, No. 25.62.) Appendix 3, Oswestry Disability Index category descriptors. Available from: FindJewelers.cz  Minimally Clinically Important Difference (MCID) = 12.8%  COGNITION: Overall cognitive status: Within functional limits for tasks assessed     SENSATION: WFL   POSTURE: rounded shoulders, forward head, and decreased lumbar lordosis  PALPATION: TTP throughout thoracic   LUMBAR ROM:   AROM eval  Flexion (5 reps) WFL but inc pain in LE   Extension (5 reps) 25% avail, relieves LE pain but inc LBP   Right lateral flexion   Left lateral flexion   Right rotation 50% pain in R side/low back and flank area  Left rotation 50% avail, pain in R side/low back and flank area   (Blank rows = not tested)   LOWER EXTREMITY MMT:    MMT Right eval Left eval  Hip flexion 4- pain in lower leg 4- pain in L knee  Hip extension 4-, LBP 4-, LBP  Hip abduction 4-, R sided pain 4-, LLE pain  Hip adduction    Hip internal rotation    Hip external rotation    Knee flexion    Knee extension    Ankle dorsiflexion 4+ 4+   Ankle plantarflexion    Ankle inversion    Ankle eversion     (Blank rows = not tested)  LUMBAR SPECIAL TESTS:    FUNCTIONAL TESTS:  30 seconds chair stand test 2 minute walk test: 369 ft  30 seconds chair stand: 11 STS, reports inc low back and LE pain, 3/10 pain   GAIT: Distance walked: 369 ft Assistive device utilized: None Level of assistance: Complete Independence Comments: WNL  TREATMENT DATE:  06/05/24 Nustep seat 6 x 5' level 2 dynamic warm up Decompression position with moist heat to right side with instruction in breathing technique for relaxation x 5' SKTC 5 x 20 each Decompression exercises 1-5; 3 hold x 5 each Decompression exercise with theraband red 1 and 2 x 8 reps each Updated HEP   05/29/24 Nustep seat 6 x 6' level 2 dynamic warm up Prone lying x 1' Trial of prone on elbows Trial of prone lying with hip shift to the left (decreased pain to 3/10 more central) Stayed in above position glute sets 5 x 10 Supine: Hooklying position for relaxation (no pain in back) Abdominal bracing 5 hold x 10 Abdominal bracing 5hold with ball squeeze hip adduction x 10 Abdominal bracing 5 hold with belt hip abduction x 10 Standing lateral wall shifts hips moving to the left Seated thoracic extension 2 x 10  Seated scapular retractions 2 x 10 Updated HEP    05/24/24 Standing:  hip abduction 2X10  Back against counter lumbar extension 2X10 Supine:  bridge 2X10  SLR 2X10 each  Piriformis stretch 2X30 each side  Hamstring stretch with towel 2X30 Prone lying 1 minute  Press ups 10X  Heelqueezes 10X5  Seated sit to stands no UE Assist 10X   05/19/24: PT eval and HEP                                                                                                                                 PATIENT EDUCATION:  Education details: PT evaluation, objective findings, POC, Importance of HEP, Precautions, Clinic policies  Person educated: Patient Education  method: Explanation and Demonstration Education comprehension: verbalized understanding and returned demonstration  HOME EXERCISE PROGRAM: 06/05/24 Decompression exercises 1-5, with theraband 1 and 2 Access Code: [email or web address] URL: [email or web address] Date: 05/19/2024 Prepared by: Rosaria Powell-Butler  Exercises - Lying Prone  - 2 x daily - 7 x weekly - 3 sets - 5 min hold - Supine Bridge  - 2 x daily - 7 x weekly - 3 sets - 10 reps - Standing Lumbar Extension at Wall - Forearms  - 2 x daily - 7 x weekly - 3 sets - 10 reps - Standing Hip Abduction with Counter Support  - 2 x daily - 7 x weekly - 3 sets - 10 reps  ASSESSMENT:  CLINICAL IMPRESSION:  No significant decrease in pain or symptoms thus far.  Trial of moist heat in decompressed position with breathing technique for relaxation followed by decompression exercises.  Updated HEP with decompression exercises. Patient needs cues to breath during exercises.  Needs cues for form and technique with cervical retraction.   Patient will benefit from continued skilled physical therapy in order to address current deficits to improve overall function and quality of life.    OBJECTIVE IMPAIRMENTS: decreased activity tolerance, decreased endurance, decreased mobility, decreased ROM, decreased strength, improper body mechanics, postural dysfunction, and pain.   ACTIVITY LIMITATIONS: carrying, lifting, bending, sitting, standing, squatting, stairs, and transfers  PARTICIPATION LIMITATIONS: meal prep, cleaning, laundry, community activity, occupation, and yard work  PERSONAL FACTORS: N/A are also affecting patient's functional outcome.   REHAB POTENTIAL: Good  CLINICAL DECISION MAKING: Stable/uncomplicated  EVALUATION COMPLEXITY: Low   GOALS: Goals reviewed with patient? Yes  SHORT TERM GOALS: Target date: 06/09/24 Patient will be independent with performance of HEP to demonstrate adequate self management of symptoms.   Baseline:  Goal status: INITIAL  2.   Patient will report at least a 25% improvement with function and/or pain reduction overall since beginning PT. Baseline:  Goal status: INITIAL   LONG TERM GOALS: Target date: 07/03/24 Patient will improve Modified Oswestry score by 12.8 % in order to demonstrate improved self-perceived disability and overall function while meeting MCID.  Baseline: Goal status: INITIAL   2.  Patient will improve  30 second chair stand  test by at least 2 STS   in order to demonstrate improved LE strength/power required for functional tranfers. Baseline:  Goal status: INITIAL   3.  Patient will improve 2 minute walk test by at least 30 ft in order to demonstrate improved LE endurance needed for community ambulation.  Baseline:  Goal status: INITIAL   4.  Patient will report overall 50% improvement since beginning PT. Baseline:  Goal status: INITIAL  5. Patient will be able to stand at least 12 minutes with reports of decreased pain, at least 4/10 or less, in order to demonstrate improved activity tolerance needed for iADLs.  Baseline:  Goal status: INITIAL   PLAN:  PT FREQUENCY: 2x/week  PT DURATION: 8 weeks  PLANNED INTERVENTIONS: 97164- PT Re-evaluation, 97110-Therapeutic exercises, 97530- Therapeutic activity, [id number]- Neuromuscular re-education, 97535- Self Care, [id number]- Manual therapy, [id number]- Gait training, [phone]- Electrical stimulation (manual), [id number]- Traction (mechanical), [phone] (1-2 muscles), [id number] (3+ muscles)- Dry Needling, Patient/Family education, Balance training, Stair training, Taping, Joint mobilization, Spinal mobilization, Cryotherapy, and Moist heat.  PLAN FOR NEXT SESSION: Cont to progress lumbar mobility, LE strength, core strength; add decompression exercises with theraband the sash and arm rotation to next visit.    2:58 PM, 06/05/24 Trica Usery Small Tiffinie Caillier MPT [place] [facility] [place] [phone]

## 2024-06-05 NOTE — Progress Notes (Deleted)
 [facility]   Telephone:([phone] Fax:(336) [phone]    Patient Care Team: Toribio Jerel MATSU, MD as PCP - General (Family Medicine) Alvan Dorn FALCON, MD as PCP - Cardiology (Cardiology) Fernande Elspeth BROCKS, MD as PCP - Electrophysiology (Cardiology) Skeet Juliene SAUNDERS, DO as Consulting Physician (Neurology) Lanny Callander, MD as Consulting Physician (Hematology and Oncology) Hope Almarie ORN, NP as Nurse Practitioner (Pulmonary Disease)   CHIEF COMPLAINT:   Oncology History   No history exists.     CURRENT THERAPY:   INTERVAL HISTORY   ROS   Past Medical History:  Diagnosis Date  . Abnormal pap 05/2012   ASCUS + HPV, 11/14 LGSIL  . Allergy    . Anemia   . Anxiety   . Asthma   . Cardiac defibrillator in place   . Chronic systolic CHF (congestive heart failure) (HCC)    a. 02/2015 Echo: EF 20%, sev dil w/ diff HK, worse @ inf base. Tiv AI, mild MR, mod dil LA, PASP .  SABRA Clotting disorder   . DVT of lower extremity (deep venous thrombosis) (HCC)   . Functional dyspepsia    early satiety   . GERD with stricture    dilated 2010  . HELICOBACTER PYLORI GASTRITIS 01/15/2009   dyspnea, ? reaction to Biaxin/amoxicillin  Pylera 01/15/09 - completed therapy     . IBS (irritable bowel syndrome)   . NICM (nonischemic cardiomyopathy) (HCC)    a. 02/2015 Cath: nl cors, EF 15%, mild PAH;  b. 02/2015 Enrolled in VEST trial.  . Sickle cell trait   . Situational depression    son died  . Vitamin B 12 deficiency 11/2022     Past Surgical History:  Procedure Laterality Date  . BIV ICD GENERATOR CHANGEOUT N/A 01/24/2021   Procedure: BIV ICD GENERATOR CHANGEOUT;  Surgeon: Fernande Elspeth BROCKS, MD;  [facility] INVASIVE CV LAB;  Service: Cardiovascular;  Laterality: N/A;  . CARDIAC CATHETERIZATION N/A 02/26/2015   Procedure: Right/Left Heart Cath and Coronary Angiography;  Surgeon: Debby DELENA Sor, MD;  Location: [facility] INVASIVE CV LAB;  Service: Cardiovascular;  Laterality:  N/A;  . COLPOSCOPY  09/08/2011   neg  . EP IMPLANTABLE DEVICE N/A 10/14/2015   Procedure: BiV ICD Insertion CRT-D;  Surgeon: Elspeth BROCKS Fernande, MD;  Location: [facility] INVASIVE CV LAB;  Service: Cardiovascular;  Laterality: N/A;  . ESOPHAGOGASTRODUODENOSCOPY (EGD) WITH ESOPHAGEAL DILATION  11/30/2008   esophageal ring dilated 54 French, H. pylori gastritis  . HARVEST BONE GRAFT  02/2023  . TUBAL LIGATION  09/07/1989  . WISDOM TOOTH EXTRACTION    . WRIST SURGERY  2023     Outpatient Encounter Medications as of 06/06/2024  Medication Sig  . albuterol  (VENTOLIN  HFA) 108 (90 Base) MCG/ACT inhaler Inhale 1-2 puffs into the lungs every 4 (four) hours as needed.  . amitriptyline  (ELAVIL ) 25 MG tablet TAKE 1 TABLET BY MOUTH EVERYDAY AT BEDTIME  . amLODipine  (NORVASC ) 2.5 MG tablet Take 1 tablet (2.5 mg total) by mouth daily.  . apixaban  (ELIQUIS ) 5 MG TABS tablet Take 1 tablet (5 mg total) by mouth 2 (two) times daily. Start taking after completion of starter pack.  . Azelastine  HCl 137 MCG/SPRAY SOLN Place 2 sprays into both nostrils 2 (two) times daily.  . carvedilol  (COREG ) 25 MG tablet Take 0.5 tablets (12.5 mg total) by mouth 2 (two) times daily with a meal.  . clobetasol  ointment (TEMOVATE ) 0.05 % Apply 1 Application topically See admin instructions. Apply  to area with itching 3 times a day for a week then twice a day for 2 weeks then daily for 1 week then twice weekly for a week.  To use with only flares  . conjugated estrogens  (PREMARIN ) vaginal cream Place 1 Applicatorful vaginally 3 (three) times a week.  . Cyanocobalamin  (VITAMIN B-12 IJ) Inject as directed every 30 (thirty) days.  . EPINEPHRINE  0.3 mg/0.3 mL IJ SOAJ injection INJECT 0.3 MG INTO THE MUSCLE AS NEEDED FOR ANAPHYLAXIS.  SABRA fexofenadine (ALLEGRA) 180 MG tablet Take 180 mg by mouth daily.  . fluticasone  (FLONASE ) 50 MCG/ACT nasal spray Place 2 sprays into both nostrils every morning.  . fluticasone -salmeterol (ADVAIR DISKUS) 250-50  MCG/ACT AEPB Inhale 1 puff into the lungs in the morning.  . furosemide  (LASIX ) 40 MG tablet Take 1 tablet (40 mg total) by mouth daily.  SABRA gabapentin (NEURONTIN) 100 MG capsule Take 1 tablet by mouth at night as tolerated  . Galcanezumab -gnlm (EMGALITY ) 120 MG/ML SOAJ Inject 120 mg into the skin every 28 (twenty-eight) days.  . nitroGLYCERIN  (NITROSTAT ) 0.4 MG SL tablet Place 1 tablet (0.4 mg total) under the tongue every 5 (five) minutes x 3 doses as needed for chest pain.  . pantoprazole  (PROTONIX ) 40 MG tablet Take 40 mg by mouth daily.  . predniSONE  (DELTASONE ) 10 MG tablet Take 2 tablets (20 mg total) by mouth daily. (Patient not taking: Reported on 05/18/2024)  . sacubitril -valsartan  (ENTRESTO ) 24-26 MG TAKE 1 TABLET BY MOUTH TWICE A DAY  . sertraline  (ZOLOFT ) 100 MG tablet Take 100 mg by mouth daily.  . spironolactone  (ALDACTONE ) 25 MG tablet TAKE 1 TABLET (25 MG TOTAL) BY MOUTH DAILY.  . Vitamin D , Ergocalciferol , (DRISDOL) 1.25 MG (50000 UNIT) CAPS capsule Take 50,000 Units by mouth once a week.   No facility-administered encounter medications on file as of 06/06/2024.     There were no vitals filed for this visit. There is no height or weight on file to calculate BMI.   ECOG PERFORMANCE STATUS: {CHL ONC ECOG PS:[id number]}  PHYSICAL EXAM GENERAL:alert, no distress and comfortable SKIN: no rash  EYES: sclera clear NECK: without mass LYMPH:  no palpable cervical or supraclavicular lymphadenopathy  LUNGS: clear with normal breathing effort HEART: regular rate & rhythm, no lower extremity edema ABDOMEN: abdomen soft, non-tender and normal bowel sounds NEURO: alert & oriented x 3 with fluent speech, no focal motor/sensory deficits Breast exam:  PAC without erythema    CBC    Latest Ref Rng & Units 05/13/2024   12:17 AM 04/29/2024    1:33 PM 04/24/2024   12:50 PM  CBC  WBC 4.0 - 10.5 K/uL 6.4  5.8  5.2   Hemoglobin 12.0 - 15.0 g/dL 88.3  87.6  87.8   Hematocrit 36.0 - 46.0 %  35.1  37.4  36.3   Platelets 150 - 400 K/uL 188  204  225       CMP     Latest Ref Rng & Units 05/13/2024   12:17 AM 04/29/2024    1:33 PM 04/24/2024   12:50 PM  CMP  Glucose 70 - 99 mg/dL 92  897  886   BUN 6 - 20 mg/dL 11  12  13    Creatinine 0.44 - 1.00 mg/dL 8.89  8.90  9.04   Sodium 135 - 145 mmol/L 139  135  139   Potassium 3.5 - 5.1 mmol/L 3.6  4.1  3.8   Chloride 98 - 111 mmol/L 105  99  104   CO2 22 - 32 mmol/L 24  25  27    Calcium 8.9 - 10.3 mg/dL 9.0  9.7  9.2   Total Protein 6.5 - 8.1 g/dL  7.5  7.4   Total Bilirubin 0.0 - 1.2 mg/dL  0.7  0.4   Alkaline Phos 38 - 126 U/L  78  79   AST 15 - 41 U/L  31  21   ALT 0 - 44 U/L  29  23       ASSESSMENT & PLAN:  PLAN:  No orders of the defined types were placed in this encounter.     All questions were answered. The patient knows to call the clinic with any problems, questions or concerns. No barriers to learning were detected. I spent *** counseling the patient face to face. The total time spent in the appointment was *** and more than 50% was on counseling, review of test results, and coordination of care.   Yamaris Cummings K Desiree Fleming, NP 06/05/2024 1:24 PM

## 2024-06-06 ENCOUNTER — Inpatient Hospital Stay

## 2024-06-06 ENCOUNTER — Encounter: Payer: Self-pay | Admitting: Nurse Practitioner

## 2024-06-06 ENCOUNTER — Inpatient Hospital Stay: Admitting: Nurse Practitioner

## 2024-06-06 ENCOUNTER — Telehealth: Payer: Self-pay | Admitting: Hematology

## 2024-06-06 NOTE — Telephone Encounter (Signed)
 I have re-scheduled Mary Pratt for her follow up appointment as she has no showed on today. I asked that she return my call if she needs to re-schedule

## 2024-06-06 NOTE — Telephone Encounter (Signed)
 Unable to find referral in the system from Dr. Jerel Sieving to specify a for a reason for visit to help guide scheduling. Is there any documentation that might be available for review?

## 2024-06-07 ENCOUNTER — Ambulatory Visit (HOSPITAL_COMMUNITY): Attending: Neurosurgery

## 2024-06-07 ENCOUNTER — Other Ambulatory Visit (HOSPITAL_COMMUNITY): Payer: Self-pay | Admitting: Physician Assistant

## 2024-06-07 ENCOUNTER — Encounter (HOSPITAL_COMMUNITY): Payer: Self-pay

## 2024-06-07 ENCOUNTER — Encounter: Payer: Self-pay | Admitting: Neurology

## 2024-06-07 DIAGNOSIS — N6002 Solitary cyst of left breast: Secondary | ICD-10-CM

## 2024-06-07 DIAGNOSIS — Z01812 Encounter for preprocedural laboratory examination: Secondary | ICD-10-CM | POA: Diagnosis present

## 2024-06-07 DIAGNOSIS — M79605 Pain in left leg: Secondary | ICD-10-CM | POA: Insufficient documentation

## 2024-06-07 DIAGNOSIS — Z7409 Other reduced mobility: Secondary | ICD-10-CM | POA: Diagnosis present

## 2024-06-07 DIAGNOSIS — I10 Essential (primary) hypertension: Secondary | ICD-10-CM | POA: Insufficient documentation

## 2024-06-07 DIAGNOSIS — M5459 Other low back pain: Secondary | ICD-10-CM | POA: Insufficient documentation

## 2024-06-07 DIAGNOSIS — M545 Low back pain, unspecified: Secondary | ICD-10-CM | POA: Diagnosis present

## 2024-06-07 DIAGNOSIS — M79604 Pain in right leg: Secondary | ICD-10-CM | POA: Diagnosis present

## 2024-06-07 NOTE — Therapy (Signed)
 OUTPATIENT PHYSICAL THERAPY THORACOLUMBAR TREATMENT   Patient Name: Mary Pratt MRN: [id number] DOB:12/21/1970, 53 y.o., female Today's Date: 06/07/2024  END OF SESSION:  PT End of Session - 06/07/24 1420     Visit Number 5    Number of Visits 8    Date for Recertification  06/16/24    Authorization Type [place] [employer] Healthy Blue    Authorization Time Period healthy blue approved 8 visits from 05/19/24-07/17/24    Authorization - Visit Number 4    Authorization - Number of Visits 8    Progress Note Due on Visit 8    PT Start Time 1421    PT Stop Time 1459    PT Time Calculation (min) 38 min    Activity Tolerance Patient tolerated treatment well    Behavior During Therapy [facility] for tasks assessed/performed          Past Medical History:  Diagnosis Date   Abnormal pap 05/2012   ASCUS + HPV, 11/14 LGSIL   Allergy     Anemia    Anxiety    Asthma    Cardiac defibrillator in place    Chronic systolic CHF (congestive heart failure) (HCC)    a. 02/2015 Echo: EF 20%, sev dil w/ diff HK, worse @ inf base. Tiv AI, mild MR, mod dil LA, PASP .   Clotting disorder    DVT of lower extremity (deep venous thrombosis) (HCC)    Functional dyspepsia    early satiety    GERD with stricture    dilated 2010   HELICOBACTER PYLORI GASTRITIS 01/15/2009   dyspnea, ? reaction to Biaxin/amoxicillin  Pylera 01/15/09 - completed therapy      IBS (irritable bowel syndrome)    NICM (nonischemic cardiomyopathy) (HCC)    a. 02/2015 Cath: nl cors, EF 15%, mild PAH;  b. 02/2015 Enrolled in VEST trial.   Sickle cell trait    Situational depression    son died   Vitamin B 12 deficiency 11/2022   Past Surgical History:  Procedure Laterality Date   BIV ICD GENERATOR CHANGEOUT N/A 01/24/2021   Procedure: BIV ICD GENERATOR CHANGEOUT;  Surgeon: Fernande Elspeth BROCKS, MD;  Location: [facility] INVASIVE CV LAB;  Service: Cardiovascular;  Laterality: N/A;   CARDIAC CATHETERIZATION N/A 02/26/2015   Procedure:  Right/Left Heart Cath and Coronary Angiography;  Surgeon: Debby DELENA Sor, MD;  Location: [facility] INVASIVE CV LAB;  Service: Cardiovascular;  Laterality: N/A;   COLPOSCOPY  09/08/2011   neg   EP IMPLANTABLE DEVICE N/A 10/14/2015   Procedure: BiV ICD Insertion CRT-D;  Surgeon: Elspeth BROCKS Fernande, MD;  Location: [facility] INVASIVE CV LAB;  Service: Cardiovascular;  Laterality: N/A;   ESOPHAGOGASTRODUODENOSCOPY (EGD) WITH ESOPHAGEAL DILATION  11/30/2008   esophageal ring dilated 54 French, H. 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   Diarrhea 08/21/2022   Right sided abdominal pain 08/21/2022   Chest pain with moderate risk for cardiac etiology  06/24/2021   ICD (implantable cardioverter-defibrillator), biventricular, in situ 12/11/2019   Fatigue 02/16/2017   Chronic systolic heart failure (HCC) 06/12/2015   Nonischemic cardiomyopathy (HCC) 02/27/2015   Essential hypertension 02/27/2015   Cardiomyopathy (HCC)    Acute systolic heart failure (HCC) 02/22/2015   Functional dyspepsia 01/23/2011   Asthma 12/28/2007   GERD with stricture 12/28/2007    PCP: Toribio Jerel MATSU, MD  REFERRING PROVIDER: Onetha Kuba, MD  REFERRING DIAG: M51.26 (ICD-10-CM) - Other intervertebral disc displacement, lumbar region  Rationale for Evaluation and Treatment: Rehabilitation  THERAPY DIAG:  Other low back pain  Low back pain radiating to both legs  Impaired functional  mobility, balance, gait, and endurance  ONSET DATE: Several months  SUBJECTIVE:                                                                                                                                                                                           SUBJECTIVE STATEMENT: 06/07/24:   PT reports pain as 3/10 today, low back, mid back and neck pain. Didn't make it to hematology appointment after last visit because she felt sick.  Reports HEP still going well.   Reports cardiologist started her on new medication a few days ago. Unsure of name.  PERTINENT HISTORY:  DVT in January 2025, currently on blood thinners  PAIN:  Are you having pain? No and Yes: NPRS scale: 7/10 Pain location: Rt lower back and hip Pain description: achey, sometimes sharp Aggravating factors: walking, standing Relieving factors: rest  PRECAUTIONS: None  RED FLAGS: None   WEIGHT BEARING RESTRICTIONS: No  FALLS:  Has patient fallen in last 6 months? No  LIVING ENVIRONMENT: Stairs: Yes: External: 13 steps; can reach both Has following equipment at home: None  OCCUPATION: N/A  PLOF: Independent  PATIENT GOALS: To get where I can feel better and stand up longer  NEXT MD VISIT: Around October 23th  OBJECTIVE:  Note: Objective measures were completed at Evaluation unless otherwise noted.  DIAGNOSTIC FINDINGS:  IMPRESSION: CT lumbar spine:   1. In comparison to October 28, 2023 MRI lumbar spine, suspected increase in a right paracentral disc protrusion at L4-L5 with potentially moderate canal stenosis. Consider MRI of the lumbar spine to confirm and better characterize. 2. No evidence of acute fracture or malalignment.   CT abdomen/pelvis:   No evidence of acute abnormality.  PATIENT SURVEYS:  Modified Oswestry: Modified Oswestry Low Back Pain Disability Questionnaire: 35 / 50 = 70.0 %   Interpretation of scores: Score Category Description  0-20% Minimal Disability The  patient can cope with most living activities. Usually no treatment is indicated  apart from advice on lifting, sitting and exercise  21-40% Moderate Disability The patient experiences more pain and difficulty with sitting, lifting and standing. Travel and social life are more difficult and they may be disabled from work. Personal care, sexual activity and sleeping are not grossly affected, and the patient can usually be managed by conservative means  41-60% Severe Disability Pain remains the main problem in this group, but activities of daily living are affected. These patients require a detailed investigation  61-80% Crippled Back pain impinges on all aspects of the patient's life. Positive intervention is required  81-100% Bed-bound  These patients are either bed-bound or exaggerating their symptoms  Bluford FORBES Zoe DELENA Karon DELENA, et al. Surgery versus conservative management of stable thoracolumbar fracture: the PRESTO feasibility RCT. [place]): [employer]; 2021 Nov. [facility] Technology Assessment, No. 25.62.) Appendix 3, Oswestry Disability Index category descriptors. Available from: FindJewelers.cz  Minimally Clinically Important Difference (MCID) = 12.8%  COGNITION: Overall cognitive status: Within functional limits for tasks assessed     SENSATION: WFL   POSTURE: rounded shoulders, forward head, and decreased lumbar lordosis  PALPATION: TTP throughout thoracic   LUMBAR ROM:   AROM eval  Flexion (5 reps) WFL but inc pain in LE   Extension (5 reps) 25% avail, relieves LE pain but inc LBP   Right lateral flexion   Left lateral flexion   Right rotation 50% pain in R side/low back and flank area  Left rotation 50% avail, pain in R side/low back and flank area   (Blank rows = not tested)   LOWER EXTREMITY MMT:    MMT Right eval Left eval  Hip flexion 4- pain in lower leg 4- pain in L knee  Hip extension 4-, LBP 4-, LBP  Hip  abduction 4-, R sided pain 4-, LLE pain  Hip adduction    Hip internal rotation    Hip external rotation    Knee flexion    Knee extension    Ankle dorsiflexion 4+ 4+  Ankle plantarflexion    Ankle inversion    Ankle eversion     (Blank rows = not tested)  LUMBAR SPECIAL TESTS:    FUNCTIONAL TESTS:  30 seconds chair stand test 2 minute walk test: 369 ft  30 seconds chair stand: 11 STS, reports inc low back and LE pain, 3/10 pain   GAIT: Distance walked: 369 ft Assistive device utilized: None Level of assistance: Complete Independence Comments: WNL  TREATMENT DATE:  06/07/24: MHP donned during for pain reduction: Decompression exercises with green TB, exer 1 and 4, 10x Abdominal bracing 5 hold x 10 LTR, feet on ball, 2' TA + bridge, 2x10 Cat/cow --> pt unable to perform due to decreased spinal mobility/coordination Karolynn pose to quadruped position, 2' Thread the needle, 1' each side STM to R Upper traps and cerv musculature, 9'   06/05/24 Nustep seat 6 x 5' level 2 dynamic warm up Decompression position with moist heat to right side with instruction in breathing technique for relaxation x 5' SKTC 5 x 20 each Decompression exercises 1-5; 3 hold x 5 each Decompression exercise with theraband red 1 and 2 x 8 reps each Updated HEP   05/29/24 Nustep seat 6 x 6' level 2 dynamic warm up Prone lying x 1' Trial of prone on elbows Trial of prone lying with hip shift to the left (decreased pain to 3/10 more central) Stayed in above position glute sets 5 x 10 Supine: Hooklying position for  relaxation (no pain in back) Abdominal bracing 5 hold x 10 Abdominal bracing 5hold with ball squeeze hip adduction x 10 Abdominal bracing 5 hold with belt hip abduction x 10 Standing lateral wall shifts hips moving to the left Seated thoracic extension 2 x 10  Seated scapular retractions 2 x 10 Updated HEP                                                                                                                                PATIENT EDUCATION:  Education details: PT evaluation, objective findings, POC, Importance of HEP, Precautions, Clinic policies  Person educated: Patient Education method: Explanation and Demonstration Education comprehension: verbalized understanding and returned demonstration  HOME EXERCISE PROGRAM: 06/05/24 Decompression exercises 1-5, with theraband 1 and 2 Access Code: [email or web address] URL: [email or web address] Date: 05/19/2024 Prepared by: Rosaria Powell-Butler  Exercises - Lying Prone  - 2 x daily - 7 x weekly - 3 sets - 5 min hold - Supine Bridge  - 2 x daily - 7 x weekly - 3 sets - 10 reps - Standing Lumbar Extension at Wall - Forearms  - 2 x daily - 7 x weekly - 3 sets - 10 reps - Standing Hip Abduction with Counter Support  - 2 x daily - 7 x weekly - 3 sets - 10 reps  ASSESSMENT:  CLINICAL IMPRESSION: Patient reports continued pain in low back and reports increased mid back and neck pain this date. Reports neck pain as stiffness/tightness. Began session with decompression exercises with MHP donned to address pain reduction/desenstization. Followed with TA activation activities for strengthening of core musculature. Pt tolerated well but reported some mid-back discomfort following bridges. Added LTR to stretch/relax area. To address decreased mid/low back mobility, attempts at cat/cow unsuccessful but pt able to perform child's pose to quadruped with no issue. Last activity thread the needle to address mid-back mobility. Ended with manual therapy to R upper trap and cervical region for pain reduction. Patient will benefit from continued skilled physical therapy in order to address current deficits to improve overall function and quality of life.     OBJECTIVE IMPAIRMENTS: decreased activity tolerance, decreased endurance, decreased mobility, decreased ROM, decreased strength, improper body mechanics, postural  dysfunction, and pain.   ACTIVITY LIMITATIONS: carrying, lifting, bending, sitting, standing, squatting, stairs, and transfers  PARTICIPATION LIMITATIONS: meal prep, cleaning, laundry, community activity, occupation, and yard work  PERSONAL FACTORS: N/A are also affecting patient's functional outcome.   REHAB POTENTIAL: Good  CLINICAL DECISION MAKING: Stable/uncomplicated  EVALUATION COMPLEXITY: Low   GOALS: Goals reviewed with patient? Yes  SHORT TERM GOALS: Target date: 06/09/24 Patient will be independent with performance of HEP to demonstrate adequate self management of symptoms.  Baseline:  Goal status: INITIAL  2.   Patient will report at least a 25% improvement with function and/or pain reduction overall since beginning PT. Baseline:  Goal status: INITIAL   LONG TERM GOALS: Target date: 07/03/24 Patient will  improve Modified Oswestry score by 12.8 % in order to demonstrate improved self-perceived disability and overall function while meeting MCID.  Baseline: Goal status: INITIAL   2.  Patient will improve  30 second chair stand  test by at least 2 STS   in order to demonstrate improved LE strength/power required for functional tranfers. Baseline:  Goal status: INITIAL   3.  Patient will improve 2 minute walk test by at least 30 ft in order to demonstrate improved LE endurance needed for community ambulation.  Baseline:  Goal status: INITIAL   4.  Patient will report overall 50% improvement since beginning PT. Baseline:  Goal status: INITIAL  5. Patient will be able to stand at least 12 minutes with reports of decreased pain, at least 4/10 or less, in order to demonstrate improved activity tolerance needed for iADLs.  Baseline:  Goal status: INITIAL   PLAN:  PT FREQUENCY: 2x/week  PT DURATION: 8 weeks  PLANNED INTERVENTIONS: 97164- PT Re-evaluation, 97110-Therapeutic exercises, 97530- Therapeutic activity, [id number]- Neuromuscular re-education, 97535- Self  Care, 02859- Manual therapy, [id number]- Gait training, [phone]- Electrical stimulation (manual), [id number]- Traction (mechanical), [phone] (1-2 muscles), 20561 (3+ muscles)- Dry Needling, Patient/Family education, Balance training, Stair training, Taping, Joint mobilization, Spinal mobilization, Cryotherapy, and Moist heat.  PLAN FOR NEXT SESSION: Cont to progress lumbar mobility, LE strength, core strength; add decompression exercises with theraband the sash and arm rotation to next visit.      4:42 PM, 06/07/24 Rosaria Settler, PT, DPT [facility]

## 2024-06-09 ENCOUNTER — Telehealth: Payer: Self-pay | Admitting: Nurse Practitioner

## 2024-06-09 ENCOUNTER — Other Ambulatory Visit: Payer: Self-pay

## 2024-06-09 NOTE — Telephone Encounter (Signed)
 Rescheduled appointments per patients request via incoming call. Talked with the patient and she is aware of the changes made to her upcoming appointments.

## 2024-06-13 ENCOUNTER — Encounter: Payer: Self-pay | Admitting: *Deleted

## 2024-06-13 ENCOUNTER — Ambulatory Visit (HOSPITAL_COMMUNITY)

## 2024-06-13 ENCOUNTER — Encounter: Payer: Self-pay | Admitting: Nurse Practitioner

## 2024-06-13 ENCOUNTER — Ambulatory Visit: Attending: Nurse Practitioner | Admitting: Nurse Practitioner

## 2024-06-13 VITALS — BP 126/88 | HR 80 | Ht 61.0 in | Wt 167.8 lb

## 2024-06-13 DIAGNOSIS — I5020 Unspecified systolic (congestive) heart failure: Secondary | ICD-10-CM | POA: Insufficient documentation

## 2024-06-13 DIAGNOSIS — I428 Other cardiomyopathies: Secondary | ICD-10-CM | POA: Insufficient documentation

## 2024-06-13 DIAGNOSIS — Z95810 Presence of automatic (implantable) cardiac defibrillator: Secondary | ICD-10-CM | POA: Diagnosis present

## 2024-06-13 DIAGNOSIS — I10 Essential (primary) hypertension: Secondary | ICD-10-CM | POA: Insufficient documentation

## 2024-06-13 DIAGNOSIS — Z86718 Personal history of other venous thrombosis and embolism: Secondary | ICD-10-CM | POA: Diagnosis present

## 2024-06-13 DIAGNOSIS — Z01812 Encounter for preprocedural laboratory examination: Secondary | ICD-10-CM | POA: Insufficient documentation

## 2024-06-13 DIAGNOSIS — R079 Chest pain, unspecified: Secondary | ICD-10-CM | POA: Insufficient documentation

## 2024-06-13 NOTE — Patient Instructions (Signed)
 Medication Instructions:   Continue all current medications.   Labwork:  BMET  - order given today Please do prior to CT  Testing/Procedures:  Coronary CTA  Follow-Up:  4-6 weeks   Any Other Special Instructions Will Be Listed Below (If Applicable).   If you need a refill on your cardiac medications before your next appointment, please call your pharmacy.

## 2024-06-13 NOTE — Progress Notes (Unsigned)
 Cardiology Office Note:   Date: 05/15/2024 ID:  Mary Pratt, DOB Jul 16, 1971, MRN [id number] PCP:  Mary Jerel MATSU, MD [facility] Providers Cardiologist:  Mary Carrier, MD Electrophysiologist:  Mary Sage, MD     Referring MD: Mary Jerel MATSU, MD   CC: Here for scheduled follow-up  History of Present Illness:    Mary Pratt is a 53 y.o. female with a PMH of chronic systolic CHF, s/p ICD, NICM, hypertension, asthma, history of pain, GERD, acute DVT, and SS trait, who presents today for scheduled follow-up.  Cardiac catheterization in 2016 revealed no CAD.  EF returned to normal after CRT-D was placed in 2017.  Low A-fib burden on device check previously.  Not on anticoagulation.  ED visit on 10/08/2023 at [facility] for atypical CP. Nothing seemed to make it better/worse. Work-up was overall unremarkable.   She returned to the ED on October 10, 2023 for chest pain with associated SHOB x 3-4 days. No PE on CT scan. Workup was unremarkable.   10/19/2023 - Today she presents for follow-up.  She admits to several chief concerns including shortness of breath that is particularly noticed when laying down, admits to 2 pillow orthopnea, says she feels bloated, and also admits to insomnia.  Denies any recurrent chest pain since leaving the ED. Denies any palpitations, syncope, presyncope, dizziness, PND, swelling or significant weight changes, acute bleeding, or claudication.  She tells me she is now being followed by hematologist who is done a thorough workup regarding her history of sickle cell.  ED visit on 10/29/2023 for chest pain, workup was overall unremarkable.   11/30/2023 -today she presents for follow-up.  Doing better from when I last saw her. Denies any chest pain, palpitations, syncope, presyncope, dizziness, orthopnea, PND, swelling or significant weight changes, acute bleeding, or claudication. Continues to admit to occasional shortness of breath.   05/15/2024 -she is here  today and admits to intermittent chest pain since March, denies any specific triggers and Tylenol  seems to help a little bit, admits to headache, chest pain episodes last only few seconds.  Chest pain goes to right to the middle of her chest, sharp in nature. She says laying down bothers her and admits to legs feeling numb.  Cannot stand for long, does admit to some hip pain. Denies any shortness of breath, palpitations, syncope, presyncope, dizziness, orthopnea, PND, swelling or significant weight changes, acute bleeding, or claudication.  She had recent ABIs done just a few days ago that were normal.  Please see the history of present illness.    All other systems reviewed and are negative.  EKGs/Labs/Other Studies Reviewed:    The following studies were reviewed today:   EKG:      Echo 10/2023:  1. Left ventricular ejection fraction, by estimation, is 55 to 60%. The  left ventricle has normal function. The left ventricle has no regional  wall motion abnormalities. There is mild left ventricular hypertrophy.  Left ventricular diastolic parameters  are indeterminate. The global longitudinal strain is indeterminate.   2. Right ventricular systolic function is normal. The right ventricular  size is normal. Tricuspid regurgitation signal is inadequate for assessing  PA pressure.   3. The mitral valve is normal in structure. No evidence of mitral valve  regurgitation. No evidence of mitral stenosis.   4. The tricuspid valve is abnormal.   5. The aortic valve is tricuspid. Aortic valve regurgitation is not  visualized. No aortic stenosis is present.   Comparison(s):  A prior study was performed on 06/25/2021. EF 50-55%. Mild LVH.  Venous US  lower extremity doppler 10/2023: IMPRESSION: Positive for deep venous thrombosis isolated to the left anterior tibial vein.  Myoview  on 07/04/2021: Normal, low risk.   Echocardiogram on 06/25/2021:  1. Abnormal septal motion from pacing . Left  ventricular ejection  fraction, by estimation, is 50 to 55%. The left ventricle has low normal  function. The left ventricle has no regional wall motion abnormalities.  There is mild left ventricular  hypertrophy. Left ventricular diastolic parameters were normal.   2. Pacing wires in RA/RV. Right ventricular systolic function is normal.  The right ventricular size is normal. There is normal pulmonary artery  systolic pressure.   3. The mitral valve is normal in structure. Trivial mitral valve  regurgitation. No evidence of mitral stenosis.   4. The aortic valve is normal in structure. Aortic valve regurgitation is  not visualized. No aortic stenosis is present.   5. The inferior vena cava is normal in size with greater than 50%  respiratory variability, suggesting right atrial pressure of 3 mmHg.  Right and left heart cath on 02/26/2015: There is severe left ventricular systolic dysfunction. Very mild elevation of right sided heart pressures Central aortic oxygen saturation 98%; pulmonary artery oxygen saturation 65%.   Dilated severe nonischemic cardiomyopathy with an ejection fraction of 15%.   Normal coronary arteries.   Mild pulmonary hypertension.   RECOMMENDATION:   Aggressive medical therapy for her nonischemic cardiomyopathy with medication titration over the next several months.  Consider interim LifeVest, pending follow-up LV function assessment in several months.  Physical Exam:    VS:  LMP 09/14/2022 (Approximate)     Wt Readings from Last 3 Encounters:  05/18/24 167 lb (75.8 kg)  05/15/24 170 lb (77.1 kg)  05/12/24 165 lb 5.5 oz (75 kg)     GEN: Obese, 53 y.o. female in no acute distress HEENT: Normal NECK: No JVD; No carotid bruits CARDIAC: S1/S2, RRR, no murmurs, rubs, gallops; 2+ peripheral pulses throughout, strong and equal bilaterally RESPIRATORY:  Clear to auscultation without rales, wheezing or rhonchi  MUSCULOSKELETAL:  No edema; No deformity  SKIN:  Warm and dry NEUROLOGIC:  Alert and oriented x 3 PSYCHIATRIC:  Normal affect   ASSESSMENT & PLAN:    In order of problems listed above:  Chest pain of uncertain etiology, screening for HLD Chest pain etiology is atypical and not felt to be cardiac in nature.  EKG is reassuring today.  Stress test from 2022 was low risk.  Did discuss risk of radiation exposure due to multiple recent CT images.  No significant findings noted on recent CT angio chest from a cardiovascular perspective.  Will begin amlodipine  2.5 mg daily and decrease carvedilol  to 12.5 mg twice daily to help symptoms if this is possibly due to vasospasm.  Will refill her nitroglycerin .  No other medication changes at this time.  Continue to follow with PCP. Heart healthy diet and regular cardiovascular exercise encouraged.  Care and ED precautions discussed. Will obtain FLP.   HFimpEF, s/p ICD, NICM Stage C, NYHA class I-II symptoms. EF normal 10/2023. Euvolemic and well compensated on exam. Continue current medication regimen. Low sodium diet, fluid restriction <2L, and daily weights encouraged. Educated to contact our office for weight gain of 2 lbs overnight or 5 lbs in one week.  Denies any shocks from her ICD.  Most recent remote device check was normal.  Continue follow-up with EP as scheduled. Instructed  her to contact Allergy /Asthma specialist for further follow-up of her occasional [facility], felt to be pulmonary related.   Hypertension Blood pressure stable.  BP well-controlled at home.  Medication changes as noted above-continue rest of medication regimen.  Heart healthy diet and regular cardiovascular exercise encouraged.   3. Hx of DVT Most recent lower extremity Doppler was negative for DVT or popliteal cyst. Continue to follow with PCP.  Disposition: Follow-up with Dr. Dorn Ross or [facility] in 4-6 weeks or sooner if anything changes.  Medication Adjustments/Labs and Tests Ordered: Current medicines are reviewed at length  with the patient today.  Concerns regarding medicines are outlined above.  No orders of the defined types were placed in this encounter.  No orders of the defined types were placed in this encounter.   There are no Patient Instructions on file for this visit.   Signed, Almarie Crate, NP

## 2024-06-13 NOTE — Therapy (Incomplete)
 OUTPATIENT PHYSICAL THERAPY THORACOLUMBAR TREATMENT   Patient Name: Mary Pratt MRN: [id number] DOB:[birth year], 53 y.o., female Today's Date: 06/13/2024  END OF SESSION:    Past Medical History:  Diagnosis Date   Abnormal pap 05/2012   ASCUS + HPV, 11/14 LGSIL   Allergy     Anemia    Anxiety    Asthma    Cardiac defibrillator in place    Chronic systolic CHF (congestive heart failure) (HCC)    a. 02/2015 Echo: EF 20%, sev dil w/ diff HK, worse @ inf base. Tiv AI, mild MR, mod dil LA, PASP .   Clotting disorder    DVT of lower extremity (deep venous thrombosis) (HCC)    Functional dyspepsia    early satiety    GERD with stricture    dilated 2010   HELICOBACTER PYLORI GASTRITIS 01/15/2009   dyspnea, ? reaction to Biaxin/amoxicillin  Pylera 01/15/09 - completed therapy      IBS (irritable bowel syndrome)    NICM (nonischemic cardiomyopathy) (HCC)    a. 02/2015 Cath: nl cors, EF 15%, mild PAH;  b. 02/2015 Enrolled in VEST trial.   Sickle cell trait    Situational depression    son died   Vitamin B 12 deficiency 11/2022   Past Surgical History:  Procedure Laterality Date   BIV ICD GENERATOR CHANGEOUT N/A 01/24/2021   Procedure: BIV ICD GENERATOR CHANGEOUT;  Surgeon: Fernande Elspeth BROCKS, MD;  Location: [facility] INVASIVE CV LAB;  Service: Cardiovascular;  Laterality: N/A;   CARDIAC CATHETERIZATION N/A 02/26/2015   Procedure: Right/Left Heart Cath and Coronary Angiography;  Surgeon: Debby DELENA Sor, MD;  Location: [facility] INVASIVE CV LAB;  Service: Cardiovascular;  Laterality: N/A;   COLPOSCOPY  09/08/2011   neg   EP IMPLANTABLE DEVICE N/A 10/14/2015   Procedure: BiV ICD Insertion CRT-D;  Surgeon: Elspeth BROCKS Fernande, MD;  Location: [facility] INVASIVE CV LAB;  Service: Cardiovascular;  Laterality: N/A;   ESOPHAGOGASTRODUODENOSCOPY (EGD) WITH ESOPHAGEAL DILATION  11/30/2008   esophageal ring dilated 54 French, H. 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   Diarrhea 08/21/2022   Right sided abdominal pain 08/21/2022   Chest pain with moderate risk for cardiac etiology 06/24/2021   ICD (implantable cardioverter-defibrillator), biventricular, in situ 12/11/2019   Fatigue 02/16/2017   Chronic systolic heart failure (HCC) 06/12/2015   Nonischemic cardiomyopathy (HCC) 02/27/2015   Essential hypertension 02/27/2015   Cardiomyopathy (HCC)    Acute systolic heart failure (HCC) 02/22/2015   Functional dyspepsia 01/23/2011   Asthma 12/28/2007   GERD with stricture 12/28/2007    PCP: Toribio Jerel MATSU, MD  REFERRING PROVIDER: Onetha Kuba, MD  REFERRING DIAG: M51.26 (ICD-10-CM) - Other intervertebral disc displacement, lumbar region  Rationale for Evaluation and Treatment: Rehabilitation  THERAPY DIAG:  No diagnosis found.  ONSET DATE: Several months  SUBJECTIVE:  SUBJECTIVE STATEMENT: ***  Reports cardiologist started her on new medication a few days ago. Unsure of name.  PERTINENT HISTORY:  DVT in January 2025, currently on blood thinners  PAIN:  Are you having pain? No and Yes: NPRS scale: 7/10 Pain location: Rt lower back and hip Pain description: achey, sometimes sharp Aggravating factors: walking, standing Relieving factors:  rest  PRECAUTIONS: None  RED FLAGS: None   WEIGHT BEARING RESTRICTIONS: No  FALLS:  Has patient fallen in last 6 months? No  LIVING ENVIRONMENT: Stairs: Yes: External: 13 steps; can reach both Has following equipment at home: None  OCCUPATION: N/A  PLOF: Independent  PATIENT GOALS: To get where I can feel better and stand up longer  NEXT MD VISIT: Around October 23th  OBJECTIVE:  Note: Objective measures were completed at Evaluation unless otherwise noted.  DIAGNOSTIC FINDINGS:  IMPRESSION: CT lumbar spine:   1. In comparison to October 28, 2023 MRI lumbar spine, suspected increase in a right paracentral disc protrusion at L4-L5 with potentially moderate canal stenosis. Consider MRI of the lumbar spine to confirm and better characterize. 2. No evidence of acute fracture or malalignment.   CT abdomen/pelvis:   No evidence of acute abnormality.  PATIENT SURVEYS:  Modified Oswestry: Modified Oswestry Low Back Pain Disability Questionnaire: 35 / 50 = 70.0 %   Interpretation of scores: Score Category Description  0-20% Minimal Disability The patient can cope with most living activities. Usually no treatment is indicated apart from advice on lifting, sitting and exercise  21-40% Moderate Disability The patient experiences more pain and difficulty with sitting, lifting and standing. Travel and social life are more difficult and they may be disabled from work. Personal care, sexual activity and sleeping are not grossly affected, and the patient can usually be managed by conservative means  41-60% Severe Disability Pain remains the main problem in this group, but activities of daily living are affected. These patients require a detailed investigation  61-80% Crippled Back pain impinges on all aspects of the patient's life. Positive intervention is required  81-100% Bed-bound  These patients are either bed-bound or exaggerating their symptoms  Bluford FORBES Zoe DELENA Karon DELENA, et al. Surgery versus conservative management of stable thoracolumbar fracture: the PRESTO feasibility RCT. [place] ([place]): [employer]; 2021 Nov. [facility] Technology Assessment, No. 25.62.) Appendix 3, Oswestry Disability Index category descriptors. Available from: FindJewelers.cz  Minimally Clinically Important Difference (MCID) = 12.8%  COGNITION: Overall cognitive status: Within functional limits for tasks assessed     SENSATION: WFL   POSTURE: rounded shoulders, forward head, and decreased lumbar lordosis  PALPATION: TTP throughout thoracic   LUMBAR ROM:   AROM eval  Flexion (5 reps) WFL but inc pain in LE   Extension (5 reps) 25% avail, relieves LE pain but inc LBP   Right lateral flexion   Left lateral flexion   Right rotation 50% pain in R side/low back and flank area  Left rotation 50% avail, pain in R side/low back and flank area   (Blank rows = not tested)   LOWER EXTREMITY MMT:    MMT Right eval Left eval  Hip flexion 4- pain in lower leg 4- pain in L knee  Hip extension 4-, LBP 4-, LBP  Hip abduction 4-, R sided pain 4-, LLE pain  Hip adduction    Hip internal rotation    Hip external rotation    Knee flexion    Knee extension    Ankle dorsiflexion 4+ 4+  Ankle plantarflexion    Ankle inversion    Ankle eversion     (Blank rows = not tested)  LUMBAR SPECIAL TESTS:    FUNCTIONAL TESTS:  30 seconds chair stand test 2 minute walk test: 369 ft  30 seconds chair stand: 11 STS, reports inc low back and LE pain, 3/10 pain   GAIT: Distance walked: 369 ft Assistive device utilized: None Level of assistance: Complete Independence Comments: WNL  TREATMENT DATE:                                    06/13/24 EXERCISE LOG  Exercise Repetitions and Resistance Comments                       Blank cell = exercise not performed today   06/07/24: MHP donned during for pain reduction: Decompression exercises  with green TB, exer 1 and 4, 10x Abdominal bracing 5 hold x 10 LTR, feet on ball, 2' TA + bridge, 2x10 Cat/cow --> pt unable to perform due to decreased spinal mobility/coordination Karolynn pose to quadruped position, 2' Thread the needle, 1' each side STM to R Upper traps and cerv musculature, 9'   06/05/24 Nustep seat 6 x 5' level 2 dynamic warm up Decompression position with moist heat to right side with instruction in breathing technique for relaxation x 5' SKTC 5 x 20 each Decompression exercises 1-5; 3 hold x 5 each Decompression exercise with theraband red 1 and 2 x 8 reps each Updated HEP  PATIENT EDUCATION:  Education details:*** Person educated: Patient Education method: Medical illustrator Education comprehension: verbalized understanding and returned demonstration  HOME EXERCISE PROGRAM: 06/05/24 Decompression exercises 1-5, with theraband 1 and 2 Access Code: [email or web address] URL: [email or web address] Date: 05/19/2024 Prepared by: Rosaria Powell-Butler  Exercises - Lying Prone  - 2 x daily - 7 x weekly - 3 sets - 5 min hold - Supine Bridge  - 2 x daily - 7 x weekly - 3 sets - 10 reps - Standing Lumbar Extension at Wall - Forearms  - 2 x daily - 7 x weekly - 3 sets - 10 reps - Standing Hip Abduction with Counter Support  - 2 x daily - 7 x weekly - 3 sets - 10 reps  ASSESSMENT:  CLINICAL IMPRESSION: ***   OBJECTIVE IMPAIRMENTS: decreased activity tolerance, decreased endurance, decreased mobility, decreased ROM, decreased strength, improper body mechanics, postural dysfunction, and pain.   ACTIVITY LIMITATIONS: carrying, lifting, bending, sitting, standing, squatting, stairs, and transfers  PARTICIPATION LIMITATIONS: meal prep, cleaning, laundry, community activity, occupation, and yard work  PERSONAL FACTORS: N/A are also affecting patient's functional outcome.   REHAB POTENTIAL: Good  CLINICAL DECISION MAKING:  Stable/uncomplicated  EVALUATION COMPLEXITY: Low   GOALS: Goals reviewed with patient? Yes  SHORT TERM GOALS: Target date: 06/09/24 Patient will be independent with performance of HEP to demonstrate adequate self management of symptoms.  Baseline:  Goal status: INITIAL  2.   Patient will report at least a 25% improvement with function and/or pain reduction overall since beginning PT. Baseline:  Goal status: INITIAL   LONG TERM GOALS: Target date: 07/03/24 Patient will improve Modified Oswestry score by 12.8 % in order to demonstrate improved self-perceived disability and overall function while meeting MCID.  Baseline: Goal status: INITIAL   2.  Patient will improve  30 second chair stand  test by at least 2 STS  in order to demonstrate improved LE strength/power required for functional tranfers. Baseline:  Goal status: INITIAL   3.  Patient will improve 2 minute walk test by at least 30 ft in order to demonstrate improved LE endurance needed for community ambulation.  Baseline:  Goal status: INITIAL   4.  Patient will report overall 50% improvement since beginning PT. Baseline:  Goal status: INITIAL  5. Patient will be able to stand at least 12 minutes with reports of decreased pain, at least 4/10 or less, in order to demonstrate improved activity tolerance needed for iADLs.  Baseline:  Goal status: INITIAL   PLAN:  PT FREQUENCY: 2x/week  PT DURATION: 8 weeks  PLANNED INTERVENTIONS: [id number]- PT Re-evaluation, 97110-Therapeutic exercises, 97530- Therapeutic activity, [id number]- Neuromuscular re-education, 97535- Self Care, [id number]- Manual therapy, [id number]- Gait training, [phone]- Electrical stimulation (manual), [id number]- Traction (mechanical), [phone] (1-2 muscles), [id number] (3+ muscles)- Dry Needling, Patient/Family education, Balance training, Stair training, Taping, Joint mobilization, Spinal mobilization, Cryotherapy, and Moist heat.  PLAN FOR NEXT SESSION: Cont to progress lumbar  mobility, LE strength, core strength; add decompression exercises with theraband the sash and arm rotation to next visit.     Lacinda Fass, PT, DPT  7:24 AM, 06/13/24

## 2024-06-15 ENCOUNTER — Encounter (HOSPITAL_COMMUNITY): Payer: Self-pay

## 2024-06-15 ENCOUNTER — Ambulatory Visit (HOSPITAL_COMMUNITY)

## 2024-06-15 ENCOUNTER — Telehealth: Payer: Self-pay | Admitting: Nurse Practitioner

## 2024-06-15 DIAGNOSIS — Z7409 Other reduced mobility: Secondary | ICD-10-CM

## 2024-06-15 DIAGNOSIS — M5459 Other low back pain: Secondary | ICD-10-CM

## 2024-06-15 DIAGNOSIS — M79605 Pain in left leg: Secondary | ICD-10-CM

## 2024-06-15 NOTE — Telephone Encounter (Signed)
 Mary Pratt requesting to be re-scheduled. She has re-scheduled to see [name] on 10/20.

## 2024-06-15 NOTE — Therapy (Signed)
 OUTPATIENT PHYSICAL THERAPY THORACOLUMBAR TREATMENT/PN   Patient Name: Mary Pratt MRN: [id number] DOB:09-Apr-1971, 53 y.o., female Today's Date: 06/15/2024  Progress Note Reporting Period 05/19/24 to 06/15/24  See note below for Objective Data and Assessment of Progress/Goals.     END OF SESSION:  PT End of Session - 06/15/24 1338     Visit Number 6    Number of Visits 8    Date for Recertification  07/03/24    Authorization Type  Medicaid [employer]    Authorization Time Period healthy blue approved 8 visits from 05/19/24-07/17/24, requested more visits on 10/9    Authorization - Visit Number 5    Authorization - Number of Visits 8    Progress Note Due on Visit 8    PT Start Time 1340   pt late arrival   PT Stop Time 1415    PT Time Calculation (min) 35 min    Activity Tolerance Patient tolerated treatment well    Behavior During Therapy [facility] for tasks assessed/performed           Past Medical History:  Diagnosis Date   Abnormal pap 05/2012   ASCUS + HPV, 11/14 LGSIL   Allergy     Anemia    Anxiety    Asthma    Cardiac defibrillator in place    Chronic systolic CHF (congestive heart failure) (HCC)    a. 02/2015 Echo: EF 20%, sev dil w/ diff HK, worse @ inf base. Tiv AI, mild MR, mod dil LA, PASP .   Clotting disorder    DVT of lower extremity (deep venous thrombosis) (HCC)    Functional dyspepsia    early satiety    GERD with stricture    dilated 2010   HELICOBACTER PYLORI GASTRITIS 01/15/2009   dyspnea, ? reaction to Biaxin/amoxicillin  Pylera 01/15/09 - completed therapy      IBS (irritable bowel syndrome)    NICM (nonischemic cardiomyopathy) (HCC)    a. 02/2015 Cath: nl cors, EF 15%, mild PAH;  b. 02/2015 Enrolled in VEST trial.   Sickle cell trait    Situational depression    son died   Vitamin B 12 deficiency 11/2022   Past Surgical History:  Procedure Laterality Date   BIV ICD GENERATOR CHANGEOUT N/A 01/24/2021   Procedure: BIV ICD  GENERATOR CHANGEOUT;  Surgeon: Fernande Elspeth BROCKS, MD;  Location: [facility] INVASIVE CV LAB;  Service: Cardiovascular;  Laterality: N/A;   CARDIAC CATHETERIZATION N/A 02/26/2015   Procedure: Right/Left Heart Cath and Coronary Angiography;  Surgeon: Debby DELENA Sor, MD;  Location: [facility] INVASIVE CV LAB;  Service: Cardiovascular;  Laterality: N/A;   COLPOSCOPY  09/08/2011   neg   EP IMPLANTABLE DEVICE N/A 10/14/2015   Procedure: BiV ICD Insertion CRT-D;  Surgeon: Elspeth BROCKS Fernande, MD;  Location: [facility] INVASIVE CV LAB;  Service: Cardiovascular;  Laterality: N/A;   ESOPHAGOGASTRODUODENOSCOPY (EGD) WITH ESOPHAGEAL DILATION  11/30/2008   esophageal ring dilated 54 French, H. 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   Diarrhea 08/21/2022   Right sided abdominal pain 08/21/2022   Chest pain with moderate risk for cardiac etiology 06/24/2021   ICD (implantable cardioverter-defibrillator), biventricular, in situ 12/11/2019   Fatigue 02/16/2017   Chronic systolic heart failure (HCC) 06/12/2015   Nonischemic cardiomyopathy (HCC) 02/27/2015   Essential hypertension 02/27/2015   Cardiomyopathy (HCC)    Acute systolic heart failure (HCC) 02/22/2015   Functional dyspepsia 01/23/2011   Asthma 12/28/2007   GERD with stricture 12/28/2007    PCP: Toribio Jerel MATSU, MD  REFERRING PROVIDER: Onetha Kuba, MD  REFERRING DIAG: M51.26 (ICD-10-CM) - Other intervertebral disc  displacement, lumbar region  Rationale for Evaluation and Treatment: Rehabilitation  THERAPY DIAG:  Other low back pain  Low back pain radiating to both legs  Impaired functional mobility, balance, gait, and endurance  ONSET DATE: Several months  SUBJECTIVE:                                                                                                                                                                                           SUBJECTIVE STATEMENT: Pt reports slight pain in low back, 2/10 today. Reports HEP going well. Reports some leg pain intermittently but not as frequent.   Reports cardiologist started her on new medication a few days ago. Unsure of name.  PERTINENT HISTORY:  DVT in January 2025, currently on blood thinners  PAIN:  Are you having pain? No and Yes: NPRS scale: 7/10 Pain location: Rt lower back and hip Pain description: achey, sometimes sharp Aggravating factors: walking, standing Relieving factors: rest  PRECAUTIONS: None  RED FLAGS: None   WEIGHT BEARING RESTRICTIONS: No  FALLS:  Has patient fallen in last 6 months? No  LIVING ENVIRONMENT: Stairs: Yes: External: 13 steps; can reach both Has following equipment at home: None  OCCUPATION: N/A  PLOF: Independent  PATIENT GOALS: To get where I can feel better and stand up longer  NEXT MD VISIT: Around October 23th  OBJECTIVE:  Note: Objective measures were completed at Evaluation unless otherwise noted.  DIAGNOSTIC FINDINGS:  IMPRESSION: CT lumbar spine:   1. In comparison to October 28, 2023 MRI lumbar spine, suspected increase in a right paracentral disc protrusion at L4-L5 with potentially moderate canal stenosis. Consider MRI of the lumbar spine to confirm and better characterize. 2. No evidence of acute fracture or malalignment.   CT abdomen/pelvis:   No evidence of acute abnormality.  PATIENT SURVEYS:  Modified Oswestry: Modified Oswestry Low Back Pain  Disability Questionnaire: 35 / 50 = 70.0 %   Interpretation of scores: Score  Category Description  0-20% Minimal Disability The patient can cope with most living activities. Usually no treatment is indicated apart from advice on lifting, sitting and exercise  21-40% Moderate Disability The patient experiences more pain and difficulty with sitting, lifting and standing. Travel and social life are more difficult and they may be disabled from work. Personal care, sexual activity and sleeping are not grossly affected, and the patient can usually be managed by conservative means  41-60% Severe Disability Pain remains the main problem in this group, but activities of daily living are affected. These patients require a detailed investigation  61-80% Crippled Back pain impinges on all aspects of the patient's life. Positive intervention is required  81-100% Bed-bound  These patients are either bed-bound or exaggerating their symptoms  Bluford FORBES Zoe DELENA Karon DELENA, et al. Surgery versus conservative management of stable thoracolumbar fracture: the PRESTO feasibility RCT. [place]): [employer]; 2021 Nov. [facility] [employer] Assessment, No. 25.62.) Appendix 3, Oswestry Disability Index category descriptors. Available from: FindJewel[employer].cz  Minimally Clinically Important Difference (MCID) = 12.8%  06/15/24: Modified Oswestry Low Back Pain Disability Questionnaire: 26 / 50 = 52.0 %   COGNITION: Overall cognitive status: Within functional limits for tasks assessed     SENSATION: WFL   POSTURE: rounded shoulders, forward head, and decreased lumbar lordosis  PALPATION: TTP throughout thoracic   LUMBAR ROM:   AROM eval 06/15/24  Flexion (5 reps) WFL but inc pain in LE    Extension (5 reps) 25% avail, relieves LE pain but inc LBP  25% avail, sight pain in low back   Right lateral flexion    Left lateral flexion    Right rotation 50% pain in R side/low  back and flank area 50% pain in R side/low back and flank area  Left rotation 50% avail, pain in R side/low back and flank area 50% pain in R side/low back and flank area   (Blank rows = not tested)   LOWER EXTREMITY MMT:    MMT Right eval Left eval R 06/15/24 L 06/15/24  Hip flexion 4- pain in lower leg 4- pain in L knee 4-, pain in R side of back  4, pain in R side of back  Hip extension 4-, LBP 4-, LBP 4- 4-  Hip abduction 4-, R sided pain 4-, LLE pain 4 4- * in LLE  Hip adduction      Hip internal rotation      Hip external rotation      Knee flexion      Knee extension      Ankle dorsiflexion 4+ 4+ 5 5  Ankle plantarflexion      Ankle inversion      Ankle eversion       (Blank rows = not tested)  LUMBAR SPECIAL TESTS:    FUNCTIONAL TESTS:  30 seconds chair stand test 2 minute walk test: 369 ft  30 seconds chair stand: 11 STS, reports inc low back and LE pain, 3/10 pain   06/15/24:  2 min walk test: 444 ft 30 second stand test: 13 STS, pain 2/10   GAIT: Distance walked: 369 ft Assistive device utilized: None Level of assistance: Complete Independence Comments: WNL  TREATMENT DATE:  06/15/24: Recumbent bike, seat 7, level 2, while completing Modified Oswestry Progress Note:        30 second stand test 2 minute walk test Lumbar ROM LE MMT Review of goals and HEP  Squats to chair level, 10x Progress to  standing on foam pad, 10x Side step + mini squat, 20 ftx4, verbal cues for form  06/07/24: MHP donned during for pain reduction: Decompression exercises with green TB, exer 1 and 4, 10x Abdominal bracing 5 hold x 10 LTR, feet on ball, 2' TA + bridge, 2x10 Cat/cow --> pt unable to perform due to decreased spinal mobility/coordination Karolynn pose to quadruped position, 2' Thread the needle, 1' each side STM to R Upper traps and cerv musculature, 9'   06/05/24 Nustep seat 6 x 5' level 2 dynamic warm up Decompression position with moist heat to right  side with instruction in breathing technique for relaxation x 5' SKTC 5 x 20 each Decompression exercises 1-5; 3 hold x 5 each Decompression exercise with theraband red 1 and 2 x 8 reps each Updated HEP  PATIENT EDUCATION:  Education details: Person educated: Patient Education method: Medical illustrator Education comprehension: verbalized understanding and returned demonstration  HOME EXERCISE PROGRAM: 06/05/24 Decompression exercises 1-5, with theraband 1 and 2 Access Code: [email or web address] URL: [email or web address] Date: 05/19/2024 Prepared by: Rosaria Powell-Butler  Exercises - Lying Prone  - 2 x daily - 7 x weekly - 3 sets - 5 min hold - Supine Bridge  - 2 x daily - 7 x weekly - 3 sets - 10 reps - Standing Lumbar Extension at Wall - Forearms  - 2 x daily - 7 x weekly - 3 sets - 10 reps - Standing Hip Abduction with Counter Support  - 2 x daily - 7 x weekly - 3 sets - 10 reps  ASSESSMENT:  CLINICAL IMPRESSION: Progress Note performed this date. Patient demonstrates progress with self perceived function, LE MMT, and during functional tests that indicate improved LE strength/power, endurance, and gait speed. Patient's lumbar ROM remains around similar levels but pt reports overall 10% improvement since starting PT and decrease in overall pain. Patient has met 3 of 7 objective goals. Importance of daily ambulation and HEP compliance stressed this date. Patient will benefit from continued skilled physical therapy in order to address current deficits to improve overall function and quality of life.    OBJECTIVE IMPAIRMENTS: decreased activity tolerance, decreased endurance, decreased mobility, decreased ROM, decreased strength, improper body mechanics, postural dysfunction, and pain.   ACTIVITY LIMITATIONS: carrying, lifting, bending, sitting, standing, squatting, stairs, and transfers  PARTICIPATION LIMITATIONS: meal prep, cleaning, laundry, community activity,  occupation, and yard work  PERSONAL FACTORS: N/A are also affecting patient's functional outcome.   REHAB POTENTIAL: Good  CLINICAL DECISION MAKING: Stable/uncomplicated  EVALUATION COMPLEXITY: Low   GOALS: Goals reviewed with patient? Yes  SHORT TERM GOALS: Target date: 06/09/24 Patient will be independent with performance of HEP to demonstrate adequate self management of symptoms.  Baseline: Reports at least 2x/wk Goal status: IN PROGRESS  2.   Patient will report at least a 25% improvement with function and/or pain reduction overall since beginning PT. Baseline: Reports 10% improvement on 10/9 Goal status: IN PROGRESS    LONG TERM GOALS: Target date: 07/03/24 Patient will improve Modified Oswestry score by 12.8 % in order to demonstrate improved self-perceived disability and overall function while meeting MCID.  Baseline: Goal status: MET   2.  Patient will improve  30 second chair stand  test by at least 2 STS   in order to demonstrate improved LE strength/power required for functional tranfers. Baseline: 13 STS on 10/9 Goal status: MET   3.  Patient will improve 2 minute walk test by at least 30 ft in order  to demonstrate improved LE endurance needed for community ambulation.  Baseline:  444 ft on 10/9 Goal status: MET   4.  Patient will report overall 50% improvement since beginning PT. Baseline:  Goal status: IN PROGRESS  5. Patient will be able to stand at least 12 minutes with reports of decreased pain, at least 4/10 or less, in order to demonstrate improved activity tolerance needed for iADLs.  Baseline: Reports 6/10 pain when standing 30 minutes  Goal status: IN PROGRESS    PLAN:  PT FREQUENCY: 2x/week  PT DURATION: 8 weeks  PLANNED INTERVENTIONS: 97164- PT Re-evaluation, 97110-Therapeutic exercises, 97530- Therapeutic activity, 97112- Neuromuscular re-education, 97535- Self Care, [id number]- Manual therapy, [id number]- Gait training, [phone]- Electrical stimulation  (manual), [id number]- Traction (mechanical), [phone] (1-2 muscles), 20561 (3+ muscles)- Dry Needling, Patient/Family education, Balance training, Stair training, Taping, Joint mobilization, Spinal mobilization, Cryotherapy, and Moist heat.  PLAN FOR NEXT SESSION: Cont to progress lumbar mobility, LE strength, core strength; add decompression exercises with theraband the sash and arm rotation to next visit.  Requested more visits on 10/9  - 2x/3wk   5:03 PM, 06/15/24 Rosaria Settler, PT, DPT [facility]     Managed Medicaid Authorization Request Treatment Start Date: 2024-05-25  Visit Dx Codes:  M54.59 Z74.09 M54.50, M79.604, M79.605  Functional Tool Score:  Modified Oswestry Low Back Pain Disability Questionnaire: 26 / 50 = 52.0 %   For all possible CPT codes, reference the Planned Interventions line above.     Check all conditions that are expected to impact treatment: {Conditions expected to impact treatment:None of these apply   If treatment provided at initial evaluation, no treatment charged due to lack of authorization.

## 2024-06-19 ENCOUNTER — Inpatient Hospital Stay

## 2024-06-19 ENCOUNTER — Inpatient Hospital Stay: Admitting: Nurse Practitioner

## 2024-06-20 ENCOUNTER — Ambulatory Visit: Admitting: Nurse Practitioner

## 2024-06-20 ENCOUNTER — Ambulatory Visit (HOSPITAL_COMMUNITY): Admitting: Physical Therapy

## 2024-06-20 ENCOUNTER — Other Ambulatory Visit: Payer: Self-pay | Admitting: *Deleted

## 2024-06-20 ENCOUNTER — Other Ambulatory Visit

## 2024-06-20 DIAGNOSIS — M5459 Other low back pain: Secondary | ICD-10-CM

## 2024-06-20 DIAGNOSIS — Z7409 Other reduced mobility: Secondary | ICD-10-CM

## 2024-06-20 DIAGNOSIS — M79605 Pain in left leg: Secondary | ICD-10-CM

## 2024-06-20 MED ORDER — METOPROLOL TARTRATE 100 MG PO TABS: 100.0000 mg | ORAL_TABLET | Freq: Once | ORAL | 0 refills | Status: DC

## 2024-06-20 NOTE — Therapy (Signed)
 OUTPATIENT PHYSICAL THERAPY THORACOLUMBAR TREATMENT   Patient Name: Mary Pratt MRN: [id number] DOB:[birth year], 53 y.o., female Today's Date: 06/20/2024    END OF SESSION:  PT End of Session - 06/20/24 1349     Visit Number 7    Number of Visits 12    Date for Recertification  07/03/24    Authorization Type [place] Medicaid Healthy [employer]    Authorization Time Period healthy blue approved 8 visits from 05/19/24-07/17/24, requested more visits on 10/9    Authorization - Visit Number 7    Authorization - Number of Visits 8   6 more visits requested on 10/9   Progress Note Due on Visit 16    PT Start Time 1342    PT Stop Time 1415    PT Time Calculation (min) 33 min    Activity Tolerance Patient tolerated treatment well    Behavior During Therapy [facility] for tasks assessed/performed            Past Medical History:  Diagnosis Date   Abnormal pap 05/2012   ASCUS + HPV, 11/14 LGSIL   Allergy     Anemia    Anxiety    Asthma    Cardiac defibrillator in place    Chronic systolic CHF (congestive heart failure) (HCC)    a. 02/2015 Echo: EF 20%, sev dil w/ diff HK, worse @ inf base. Tiv AI, mild MR, mod dil LA, PASP .   Clotting disorder    DVT of lower extremity (deep venous thrombosis) (HCC)    Functional dyspepsia    early satiety    GERD with stricture    dilated 2010   HELICOBACTER PYLORI GASTRITIS 01/15/2009   dyspnea, ? reaction to Biaxin/amoxicillin  Pylera 01/15/09 - completed therapy      IBS (irritable bowel syndrome)    NICM (nonischemic cardiomyopathy) (HCC)    a. 02/2015 Cath: nl cors, EF 15%, mild PAH;  b. 02/2015 Enrolled in VEST trial.   Sickle cell trait    Situational depression    son died   Vitamin B 12 deficiency 11/2022   Past Surgical History:  Procedure Laterality Date   BIV ICD GENERATOR CHANGEOUT N/A 01/24/2021   Procedure: BIV ICD GENERATOR CHANGEOUT;  Surgeon: Fernande Elspeth BROCKS, MD;  Location: [facility] INVASIVE CV LAB;  Service: Cardiovascular;   Laterality: N/A;   CARDIAC CATHETERIZATION N/A 02/26/2015   Procedure: Right/Left Heart Cath and Coronary Angiography;  Surgeon: Debby DELENA Sor, MD;  Location: [facility] INVASIVE CV LAB;  Service: Cardiovascular;  Laterality: N/A;   COLPOSCOPY  09/08/2011   neg   EP IMPLANTABLE DEVICE N/A 10/14/2015   Procedure: BiV ICD Insertion CRT-D;  Surgeon: Elspeth BROCKS Fernande, MD;  Location: [facility] INVASIVE CV LAB;  Service: Cardiovascular;  Laterality: N/A;   ESOPHAGOGASTRODUODENOSCOPY (EGD) WITH ESOPHAGEAL DILATION  11/30/2008   esophageal ring dilated 54 French, H. 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   Diarrhea 08/21/2022  Right sided abdominal pain 08/21/2022   Chest pain with moderate risk for cardiac etiology 06/24/2021   ICD (implantable cardioverter-defibrillator), biventricular, in situ 12/11/2019   Fatigue 02/16/2017   Chronic systolic heart failure (HCC) 06/12/2015   Nonischemic cardiomyopathy (HCC) 02/27/2015   Essential hypertension 02/27/2015   Cardiomyopathy (HCC)    Acute systolic heart failure (HCC) 02/22/2015   Functional dyspepsia 01/23/2011   Asthma 12/28/2007   GERD with stricture 12/28/2007    PCP: Toribio Jerel MATSU, MD  REFERRING PROVIDER: Onetha Kuba, MD  REFERRING DIAG: M51.26 (ICD-10-CM) - Other intervertebral disc displacement, lumbar region  Rationale for Evaluation and Treatment: Rehabilitation  THERAPY DIAG:  Other low back  pain  Low back pain radiating to both legs  Impaired functional mobility, balance, gait, and endurance  ONSET DATE: Several months  SUBJECTIVE:                                                                                                                                                                                           SUBJECTIVE STATEMENT: Pt with late arrival.  Currently increased pain to 4/10.  Reports HEP completion 1X daily.    Reports cardiologist started her on new medication a few days ago. Unsure of name.  PERTINENT HISTORY:  DVT in January 2025, currently on blood thinners  PAIN:  Are you having pain? No and Yes: NPRS scale: 7/10 Pain location: Rt lower back and hip Pain description: achey, sometimes sharp Aggravating factors: walking, standing Relieving factors: rest  PRECAUTIONS: None  RED FLAGS: None   WEIGHT BEARING RESTRICTIONS: No  FALLS:  Has patient fallen in last 6 months? No  LIVING ENVIRONMENT: Stairs: Yes: External: 13 steps; can reach both Has following equipment at home: None  OCCUPATION: N/A  PLOF: Independent  PATIENT GOALS: To get where I can feel better and stand up longer  NEXT MD VISIT: Around October 23th  OBJECTIVE:  Note: Objective measures were completed at Evaluation unless otherwise noted.  DIAGNOSTIC FINDINGS:  IMPRESSION: CT lumbar spine:   1. In comparison to October 28, 2023 MRI lumbar spine, suspected increase in a right paracentral disc protrusion at L4-L5 with potentially moderate canal stenosis. Consider MRI of the lumbar spine to confirm and better characterize. 2. No evidence of acute fracture or malalignment.   CT abdomen/pelvis:   No evidence of acute abnormality.  PATIENT SURVEYS:  Modified Oswestry: Modified Oswestry Low Back Pain Disability Questionnaire: 35 / 50 = 70.0 %   Interpretation of scores: Score Category Description  0-20% Minimal Disability The patient can cope with most  living activities. Usually no treatment is indicated apart from advice  on lifting, sitting and exercise  21-40% Moderate Disability The patient experiences more pain and difficulty with sitting, lifting and standing. Travel and social life are more difficult and they may be disabled from work. Personal care, sexual activity and sleeping are not grossly affected, and the patient can usually be managed by conservative means  41-60% Severe Disability Pain remains the main problem in this group, but activities of daily living are affected. These patients require a detailed investigation  61-80% Crippled Back pain impinges on all aspects of the patient's life. Positive intervention is required  81-100% Bed-bound  These patients are either bed-bound or exaggerating their symptoms  Bluford FORBES Zoe DELENA Karon DELENA, et al. Surgery versus conservative management of stable thoracolumbar fracture: the PRESTO feasibility RCT. [place]): [employer]; 2021 Nov. [facility] Technology Assessment, No. 25.62.) Appendix 3, Oswestry Disability Index category descriptors. Available from: FindJewel[employer].cz  Minimally Clinically Important Difference (MCID) = 12.8%  06/15/24: Modified Oswestry Low Back Pain Disability Questionnaire: 26 / 50 = 52.0 %   COGNITION: Overall cognitive status: Within functional limits for tasks assessed     SENSATION: WFL   POSTURE: rounded shoulders, forward head, and decreased lumbar lordosis  PALPATION: TTP throughout thoracic   LUMBAR ROM:   AROM eval 06/15/24  Flexion (5 reps) WFL but inc pain in LE    Extension (5 reps) 25% avail, relieves LE pain but inc LBP  25% avail, sight pain in low back   Right lateral flexion    Left lateral flexion    Right rotation 50% pain in R side/low back and flank area 50% pain in R side/low back and flank area  Left rotation 50% avail, pain in R side/low back and flank area 50% pain in R side/low back  and flank area   (Blank rows = not tested)   LOWER EXTREMITY MMT:    MMT Right eval Left eval R 06/15/24 L 06/15/24  Hip flexion 4- pain in lower leg 4- pain in L knee 4-, pain in R side of back  4, pain in R side of back  Hip extension 4-, LBP 4-, LBP 4- 4-  Hip abduction 4-, R sided pain 4-, LLE pain 4 4- * in LLE  Hip adduction      Hip internal rotation      Hip external rotation      Knee flexion      Knee extension      Ankle dorsiflexion 4+ 4+ 5 5  Ankle plantarflexion      Ankle inversion      Ankle eversion       (Blank rows = not tested)  LUMBAR SPECIAL TESTS:    FUNCTIONAL TESTS:  30 seconds chair stand test 2 minute walk test: 369 ft  30 seconds chair stand: 11 STS, reports inc low back and LE pain, 3/10 pain   06/15/24:  2 min walk test: 444 ft 30 second stand test: 13 STS, pain 2/10   GAIT: Distance walked: 369 ft Assistive device utilized: None Level of assistance: Complete Independence Comments: WNL  TREATMENT DATE:  06/20/24 Supine:  decompression with tband 10X each GTB  Bridge 10X  Bridge with abduction in extension with GTB 10X  SLR with abdominal bracing 10X each side Sit to stands no UE 10X   06/15/24: Recumbent bike, seat 7, level 2, while completing Modified Oswestry Progress Note:        30 second stand test 2 minute walk test Lumbar ROM  LE MMT Review of goals and HEP  Squats to chair level, 10x Progress to standing on foam pad, 10x Side step + mini squat, 20 ftx4, verbal cues for form  06/07/24: MHP donned during for pain reduction: Decompression exercises with green TB, exer 1 and 4, 10x Abdominal bracing 5 hold x 10 LTR, feet on ball, 2' TA + bridge, 2x10 Cat/cow --> pt unable to perform due to decreased spinal mobility/coordination Karolynn pose to quadruped position, 2' Thread the needle, 1' each side STM to R Upper traps and cerv musculature, 9'   06/05/24 Nustep seat 6 x 5' level 2 dynamic warm up Decompression  position with moist heat to right side with instruction in breathing technique for relaxation x 5' SKTC 5 x 20 each Decompression exercises 1-5; 3 hold x 5 each Decompression exercise with theraband red 1 and 2 x 8 reps each Updated HEP  PATIENT EDUCATION:  Education details: Person educated: Patient Education method: Medical illustrator Education comprehension: verbalized understanding and returned demonstration  HOME EXERCISE PROGRAM: 06/05/24 Decompression exercises 1-5, with theraband 1 and 2 Access Code: [email or web address] URL: [email or web address] Date: 05/19/2024 Prepared by: Rosaria Powell-Butler  Exercises - Lying Prone  - 2 x daily - 7 x weekly - 3 sets - 5 min hold - Supine Bridge  - 2 x daily - 7 x weekly - 3 sets - 10 reps - Standing Lumbar Extension at Wall - Forearms  - 2 x daily - 7 x weekly - 3 sets - 10 reps - Standing Hip Abduction with Counter Support  - 2 x daily - 7 x weekly - 3 sets - 10 reps  ASSESSMENT:  CLINICAL IMPRESSION: Continued with focus on improving core stabilization and postural strength.  Pt requested review of decompression theraband exercises.  Pt required cues with initiation but overall good control and stabilization.  Session limited by late arrival today.  Patient will benefit from continued skilled physical therapy in order to address current deficits to improve overall function and quality of life.    OBJECTIVE IMPAIRMENTS: decreased activity tolerance, decreased endurance, decreased mobility, decreased ROM, decreased strength, improper body mechanics, postural dysfunction, and pain.   ACTIVITY LIMITATIONS: carrying, lifting, bending, sitting, standing, squatting, stairs, and transfers  PARTICIPATION LIMITATIONS: meal prep, cleaning, laundry, community activity, occupation, and yard work  PERSONAL FACTORS: N/A are also affecting patient's functional outcome.   REHAB POTENTIAL: Good  CLINICAL DECISION MAKING:  Stable/uncomplicated  EVALUATION COMPLEXITY: Low   GOALS: Goals reviewed with patient? Yes  SHORT TERM GOALS: Target date: 06/09/24 Patient will be independent with performance of HEP to demonstrate adequate self management of symptoms.  Baseline: Reports at least 2x/wk Goal status: IN PROGRESS  2.   Patient will report at least a 25% improvement with function and/or pain reduction overall since beginning PT. Baseline: Reports 10% improvement on 10/9 Goal status: IN PROGRESS    LONG TERM GOALS: Target date: 07/03/24 Patient will improve Modified Oswestry score by 12.8 % in order to demonstrate improved self-perceived disability and overall function while meeting MCID.  Baseline: Goal status: MET   2.  Patient will improve  30 second chair stand  test by at least 2 STS   in order to demonstrate improved LE strength/power required for functional tranfers. Baseline: 13 STS on 10/9 Goal status: MET   3.  Patient will improve 2 minute walk test by at least 30 ft in order to demonstrate improved LE endurance needed for community ambulation.  Baseline:  444  ft on 10/9 Goal status: MET   4.  Patient will report overall 50% improvement since beginning PT. Baseline:  Goal status: IN PROGRESS  5. Patient will be able to stand at least 12 minutes with reports of decreased pain, at least 4/10 or less, in order to demonstrate improved activity tolerance needed for iADLs.  Baseline: Reports 6/10 pain when standing 30 minutes  Goal status: IN PROGRESS    PLAN:  PT FREQUENCY: 2x/week  PT DURATION: 8 weeks  PLANNED INTERVENTIONS: 97164- PT Re-evaluation, 97110-Therapeutic exercises, 97530- Therapeutic activity, 97112- Neuromuscular re-education, 97535- Self Care, 02859- Manual therapy, [id number]- Gait training, [id number]- Electrical stimulation (manual), [id number]- Traction (mechanical), [phone] (1-2 muscles), 20561 (3+ muscles)- Dry Needling, Patient/Family education, Balance training, Stair training,  Taping, Joint mobilization, Spinal mobilization, Cryotherapy, and Moist heat.  PLAN FOR NEXT SESSION: Cont to progress lumbar mobility, LE strength and core strength.   2:14 PM, 06/20/24 Greig KATHEE Fuse, PTA/CLT [facility] Ph: [phone]

## 2024-06-22 ENCOUNTER — Other Ambulatory Visit (HOSPITAL_COMMUNITY)
Admission: RE | Admit: 2024-06-22 | Discharge: 2024-06-22 | Disposition: A | Discharge status: Home / Self Care | Source: Ambulatory Visit | Attending: Nurse Practitioner | Admitting: Nurse Practitioner

## 2024-06-22 ENCOUNTER — Encounter (HOSPITAL_COMMUNITY): Payer: Self-pay

## 2024-06-22 ENCOUNTER — Ambulatory Visit (HOSPITAL_COMMUNITY)

## 2024-06-22 ENCOUNTER — Ambulatory Visit: Payer: Self-pay | Admitting: Nurse Practitioner

## 2024-06-22 DIAGNOSIS — Z01812 Encounter for preprocedural laboratory examination: Secondary | ICD-10-CM | POA: Insufficient documentation

## 2024-06-22 DIAGNOSIS — M5459 Other low back pain: Secondary | ICD-10-CM

## 2024-06-22 DIAGNOSIS — Z7409 Other reduced mobility: Secondary | ICD-10-CM

## 2024-06-22 DIAGNOSIS — I10 Essential (primary) hypertension: Secondary | ICD-10-CM | POA: Insufficient documentation

## 2024-06-22 DIAGNOSIS — M79605 Pain in left leg: Secondary | ICD-10-CM

## 2024-06-22 LAB — BASIC METABOLIC PANEL WITH GFR
Anion gap: 10 (ref 5–15)
BUN: 10 mg/dL (ref 6–20)
CO2: 27 mmol/L (ref 22–32)
Calcium: 9.7 mg/dL (ref 8.9–10.3)
Chloride: 104 mmol/L (ref 98–111)
Creatinine, Ser: 0.94 mg/dL (ref 0.44–1.00)
GFR, Estimated: >60 mL/min (ref >60)
Glucose, Bld: 97 mg/dL (ref 70–99)
Potassium: 4 mmol/L (ref 3.5–5.1)
Sodium: 140 mmol/L (ref 135–145)

## 2024-06-22 NOTE — Therapy (Signed)
 OUTPATIENT PHYSICAL THERAPY THORACOLUMBAR TREATMENT   Patient Name: Mary Pratt MRN: [id number] DOB:09-09-1970, 53 y.o., female Today's Date: 06/22/2024    END OF SESSION:  PT End of Session - 06/22/24 0916     Visit Number 8    Number of Visits 13    Date for Recertification  07/03/24    Authorization Type [place] Medicaid Healthy Blue    Authorization Time Period healthy blue approved 5 visits from 06/22/2024-08/20/2024    Authorization - Visit Number 1    Authorization - Number of Visits 5    Progress Note Due on Visit 13    PT Start Time 0917   Pt late arrival   PT Stop Time 0947    PT Time Calculation (min) 30 min    Activity Tolerance Patient tolerated treatment well    Behavior During Therapy [facility] for tasks assessed/performed            Past Medical History:  Diagnosis Date   Abnormal pap 05/2012   ASCUS + HPV, 11/14 LGSIL   Allergy     Anemia    Anxiety    Asthma    Cardiac defibrillator in place    Chronic systolic CHF (congestive heart failure) (HCC)    a. 02/2015 Echo: EF 20%, sev dil w/ diff HK, worse @ inf base. Tiv AI, mild MR, mod dil LA, PASP .   Clotting disorder    DVT of lower extremity (deep venous thrombosis) (HCC)    Functional dyspepsia    early satiety    GERD with stricture    dilated 2010   HELICOBACTER PYLORI GASTRITIS 01/15/2009   dyspnea, ? reaction to Biaxin/amoxicillin  Pylera 01/15/09 - completed therapy      IBS (irritable bowel syndrome)    NICM (nonischemic cardiomyopathy) (HCC)    a. 02/2015 Cath: nl cors, EF 15%, mild PAH;  b. 02/2015 Enrolled in VEST trial.   Sickle cell trait    Situational depression    son died   Vitamin B 12 deficiency 11/2022   Past Surgical History:  Procedure Laterality Date   BIV ICD GENERATOR CHANGEOUT N/A 01/24/2021   Procedure: BIV ICD GENERATOR CHANGEOUT;  Surgeon: Fernande Elspeth BROCKS, MD;  Location: [facility] INVASIVE CV LAB;  Service: Cardiovascular;  Laterality: N/A;   CARDIAC CATHETERIZATION  N/A 02/26/2015   Procedure: Right/Left Heart Cath and Coronary Angiography;  Surgeon: Debby DELENA Sor, MD;  Location: [facility] INVASIVE CV LAB;  Service: Cardiovascular;  Laterality: N/A;   COLPOSCOPY  09/08/2011   neg   EP IMPLANTABLE DEVICE N/A 10/14/2015   Procedure: BiV ICD Insertion CRT-D;  Surgeon: Elspeth BROCKS Fernande, MD;  Location: [facility] INVASIVE CV LAB;  Service: Cardiovascular;  Laterality: N/A;   ESOPHAGOGASTRODUODENOSCOPY (EGD) WITH ESOPHAGEAL DILATION  11/30/2008   esophageal ring dilated 54 French, H. 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   Diarrhea 08/21/2022   Right sided abdominal pain 08/21/2022  Chest pain with moderate risk for cardiac etiology 06/24/2021   ICD (implantable cardioverter-defibrillator), biventricular, in situ 12/11/2019   Fatigue 02/16/2017   Chronic systolic heart failure (HCC) 06/12/2015   Nonischemic cardiomyopathy (HCC) 02/27/2015   Essential hypertension 02/27/2015   Cardiomyopathy (HCC)    Acute systolic heart failure (HCC) 02/22/2015   Functional dyspepsia 01/23/2011   Asthma 12/28/2007   GERD with stricture 12/28/2007    PCP: Toribio Jerel MATSU, MD  REFERRING PROVIDER: Onetha Kuba, MD  REFERRING DIAG: M51.26 (ICD-10-CM) - Other intervertebral disc displacement, lumbar region  Rationale for Evaluation and Treatment: Rehabilitation  THERAPY DIAG:  Other low back pain  Low back pain radiating to both  legs  Impaired functional mobility, balance, gait, and endurance  ONSET DATE: Several months  SUBJECTIVE:                                                                                                                                                                                           SUBJECTIVE STATEMENT: Pt with late arrival. Pt reports 5/10 LLE pain today. Reports it as a heavy feeling.    Reports cardiologist started her on new medication a few days ago. Unsure of name.  PERTINENT HISTORY:  DVT in January 2025, currently on blood thinners  PAIN:  Are you having pain? No and Yes: NPRS scale: 7/10 Pain location: Rt lower back and hip Pain description: achey, sometimes sharp Aggravating factors: walking, standing Relieving factors: rest  PRECAUTIONS: None  RED FLAGS: None   WEIGHT BEARING RESTRICTIONS: No  FALLS:  Has patient fallen in last 6 months? No  LIVING ENVIRONMENT: Stairs: Yes: External: 13 steps; can reach both Has following equipment at home: None  OCCUPATION: N/A  PLOF: Independent  PATIENT GOALS: To get where I can feel better and stand up longer  NEXT MD VISIT: Around October 23th  OBJECTIVE:  Note: Objective measures were completed at Evaluation unless otherwise noted.  DIAGNOSTIC FINDINGS:  IMPRESSION: CT lumbar spine:   1. In comparison to October 28, 2023 MRI lumbar spine, suspected increase in a right paracentral disc protrusion at L4-L5 with potentially moderate canal stenosis. Consider MRI of the lumbar spine to confirm and better characterize. 2. No evidence of acute fracture or malalignment.   CT abdomen/pelvis:   No evidence of acute abnormality.  PATIENT SURVEYS:  Modified Oswestry: Modified Oswestry Low Back Pain Disability Questionnaire: 35 / 50 = 70.0 %   Interpretation of scores: Score Category Description  0-20% Minimal Disability The patient can cope with most living activities. Usually no treatment is  indicated apart from advice on lifting, sitting and exercise  21-40%  Moderate Disability The patient experiences more pain and difficulty with sitting, lifting and standing. Travel and social life are more difficult and they may be disabled from work. Personal care, sexual activity and sleeping are not grossly affected, and the patient can usually be managed by conservative means  41-60% Severe Disability Pain remains the main problem in this group, but activities of daily living are affected. These patients require a detailed investigation  61-80% Crippled Back pain impinges on all aspects of the patient's life. Positive intervention is required  81-100% Bed-bound  These patients are either bed-bound or exaggerating their symptoms  Bluford FORBES Zoe DELENA Karon DELENA, et al. Surgery versus conservative management of stable thoracolumbar fracture: the PRESTO feasibility RCT. [place]): [employer]; 2021 Nov. [facility] Technology Assessment, No. 25.62.) Appendix 3, Oswestry Disability Index category descriptors. Available from: FindJewelers.cz  Minimally Clinically Important Difference (MCID) = 12.8%  06/15/24: Modified Oswestry Low Back Pain Disability Questionnaire: 26 / 50 = 52.0 %   COGNITION: Overall cognitive status: Within functional limits for tasks assessed     SENSATION: WFL   POSTURE: rounded shoulders, forward head, and decreased lumbar lordosis  PALPATION: TTP throughout thoracic   LUMBAR ROM:   AROM eval 06/15/24  Flexion (5 reps) WFL but inc pain in LE    Extension (5 reps) 25% avail, relieves LE pain but inc LBP  25% avail, sight pain in low back   Right lateral flexion    Left lateral flexion    Right rotation 50% pain in R side/low back and flank area 50% pain in R side/low back and flank area  Left rotation 50% avail, pain in R side/low back and flank area 50% pain in R side/low back and flank area   (Blank rows = not  tested)   LOWER EXTREMITY MMT:    MMT Right eval Left eval R 06/15/24 L 06/15/24  Hip flexion 4- pain in lower leg 4- pain in L knee 4-, pain in R side of back  4, pain in R side of back  Hip extension 4-, LBP 4-, LBP 4- 4-  Hip abduction 4-, R sided pain 4-, LLE pain 4 4- * in LLE  Hip adduction      Hip internal rotation      Hip external rotation      Knee flexion      Knee extension      Ankle dorsiflexion 4+ 4+ 5 5  Ankle plantarflexion      Ankle inversion      Ankle eversion       (Blank rows = not tested)  LUMBAR SPECIAL TESTS:    FUNCTIONAL TESTS:  30 seconds chair stand test 2 minute walk test: 369 ft  30 seconds chair stand: 11 STS, reports inc low back and LE pain, 3/10 pain   06/15/24:  2 min walk test: 444 ft 30 second stand test: 13 STS, pain 2/10   GAIT: Distance walked: 369 ft Assistive device utilized: None Level of assistance: Complete Independence Comments: WNL  TREATMENT DATE:  06/22/24: Shoulder rows, RTB, TA contraction, 2x10 Shoulder extension, RTB, TA contraction, 2x10 Paloff press, RTB, 2x10 Sit to stand with 10 lb kettle bell, 10x, v cues for posture and TA contraction 10x w/ kettle bell lift to chest height Supine hamstring stretch, 3x30 Supine piriformis stretch, 2x30   06/20/24 Supine:  decompression with tband 10X each GTB  Bridge 10X  Bridge with abduction in extension with GTB 10X  SLR  with abdominal bracing 10X each side Sit to stands no UE 10X   06/15/24: Recumbent bike, seat 7, level 2, while completing Modified Oswestry Progress Note:        30 second stand test 2 minute walk test Lumbar ROM LE MMT Review of goals and HEP  Squats to chair level, 10x Progress to standing on foam pad, 10x Side step + mini squat, 20 ftx4, verbal cues for form    PATIENT EDUCATION:  Education details: Person educated: Patient Education method: Medical illustrator Education comprehension: verbalized understanding  and returned demonstration  HOME EXERCISE PROGRAM: 06/05/24 Decompression exercises 1-5, with theraband 1 and 2 Access Code: [email or web address] URL: [email or web address] Date: 05/19/2024 Prepared by: Rosaria Powell-Butler  Exercises - Lying Prone  - 2 x daily - 7 x weekly - 3 sets - 5 min hold - Supine Bridge  - 2 x daily - 7 x weekly - 3 sets - 10 reps - Standing Lumbar Extension at Wall - Forearms  - 2 x daily - 7 x weekly - 3 sets - 10 reps - Standing Hip Abduction with Counter Support  - 2 x daily - 7 x weekly - 3 sets - 10 reps   Exercises - Hooklying Hamstring Stretch with Strap  - 2 x daily - 7 x weekly - 3 sets - 30 hold - Supine Piriformis Stretch with Foot on Ground  - 2 x daily - 7 x weekly - 3 sets - 30 hold  ASSESSMENT:  CLINICAL IMPRESSION: Patient arrives to session with little back pain but increased LLE pain, reporting as heaviness. Pt reporting concern of blood clot. Educated on signs of clotting being swelling, redness, and warmth. Checked pt's L gastroc, no concern this date. Added postural re-education this date with focus on transverse abdominus muscle and scapular retraction. Pt tolerated well. Followed with LE strengthening with weighted STS, pt reporting increased discomfort in LLE. Ended with stretching of posterior musculature bilaterally. More tightness noted with L hamstring stretching. Pt tolerated well. Added stretches to HEP. Patient will benefit from continued skilled physical therapy in order to address current deficits to improve overall function and quality of life    OBJECTIVE IMPAIRMENTS: decreased activity tolerance, decreased endurance, decreased mobility, decreased ROM, decreased strength, improper body mechanics, postural dysfunction, and pain.   ACTIVITY LIMITATIONS: carrying, lifting, bending, sitting, standing, squatting, stairs, and transfers  PARTICIPATION LIMITATIONS: meal prep, cleaning, laundry, community activity, occupation, and  yard work  PERSONAL FACTORS: N/A are also affecting patient's functional outcome.   REHAB POTENTIAL: Good  CLINICAL DECISION MAKING: Stable/uncomplicated  EVALUATION COMPLEXITY: Low   GOALS: Goals reviewed with patient? Yes  SHORT TERM GOALS: Target date: 06/09/24 Patient will be independent with performance of HEP to demonstrate adequate self management of symptoms.  Baseline: Reports at least 2x/wk Goal status: IN PROGRESS  2.   Patient will report at least a 25% improvement with function and/or pain reduction overall since beginning PT. Baseline: Reports 10% improvement on 10/9 Goal status: IN PROGRESS    LONG TERM GOALS: Target date: 07/03/24 Patient will improve Modified Oswestry score by 12.8 % in order to demonstrate improved self-perceived disability and overall function while meeting MCID.  Baseline: Goal status: MET   2.  Patient will improve  30 second chair stand  test by at least 2 STS   in order to demonstrate improved LE strength/power required for functional tranfers. Baseline: 13 STS on 10/9 Goal status: MET   3.  Patient will improve 2  minute walk test by at least 30 ft in order to demonstrate improved LE endurance needed for community ambulation.  Baseline:  444 ft on 10/9 Goal status: MET   4.  Patient will report overall 50% improvement since beginning PT. Baseline:  Goal status: IN PROGRESS  5. Patient will be able to stand at least 12 minutes with reports of decreased pain, at least 4/10 or less, in order to demonstrate improved activity tolerance needed for iADLs.  Baseline: Reports 6/10 pain when standing 30 minutes  Goal status: IN PROGRESS    PLAN:  PT FREQUENCY: 2x/week  PT DURATION: 8 weeks  PLANNED INTERVENTIONS: 97164- PT Re-evaluation, 97110-Therapeutic exercises, 97530- Therapeutic activity, 97112- Neuromuscular re-education, 97535- Self Care, 02859- Manual therapy, [id number]- Gait training, [phone]- Electrical stimulation (manual), [id number]-  Traction (mechanical), [phone] (1-2 muscles), 20561 (3+ muscles)- Dry Needling, Patient/Family education, Balance training, Stair training, Taping, Joint mobilization, Spinal mobilization, Cryotherapy, and Moist heat.  PLAN FOR NEXT SESSION: Cont to progress lumbar mobility, LE strength and core strength.   9:48 AM, 06/22/24 Rosaria Settler, PT, DPT [facility]

## 2024-06-23 ENCOUNTER — Telehealth (HOSPITAL_COMMUNITY): Payer: Self-pay | Admitting: Emergency Medicine

## 2024-06-23 ENCOUNTER — Other Ambulatory Visit: Payer: Self-pay | Admitting: Nephrology

## 2024-06-23 DIAGNOSIS — N182 Chronic kidney disease, stage 2 (mild): Secondary | ICD-10-CM

## 2024-06-23 NOTE — Telephone Encounter (Signed)
 Reaching out to patient to offer assistance regarding upcoming cardiac imaging study; pt verbalizes understanding of appt date/time, parking situation and where to check in, pre-test NPO status and medications ordered, and verified current allergies; name and call back number provided for further questions should they arise Rockwell Alexandria RN Navigator Cardiac Imaging [facility] [phone] office [phone] cell

## 2024-06-23 NOTE — Telephone Encounter (Signed)
 Attempted to call patient regarding upcoming cardiac CT appointment. Left message on voicemail with name and callback number Rockwell Alexandria RN Navigator Cardiac Imaging [facility] [phone] Office [phone] Cell

## 2024-06-26 ENCOUNTER — Ambulatory Visit (HOSPITAL_COMMUNITY)
Admission: RE | Admit: 2024-06-26 | Discharge: 2024-06-26 | Disposition: A | Discharge status: Home / Self Care | Source: Ambulatory Visit | Attending: Nurse Practitioner | Admitting: Nurse Practitioner

## 2024-06-26 ENCOUNTER — Inpatient Hospital Stay

## 2024-06-26 ENCOUNTER — Inpatient Hospital Stay: Admitting: Nurse Practitioner

## 2024-06-26 DIAGNOSIS — R079 Chest pain, unspecified: Secondary | ICD-10-CM

## 2024-06-26 DIAGNOSIS — I2510 Atherosclerotic heart disease of native coronary artery without angina pectoris: Secondary | ICD-10-CM | POA: Diagnosis not present

## 2024-06-26 DIAGNOSIS — I5020 Unspecified systolic (congestive) heart failure: Secondary | ICD-10-CM | POA: Diagnosis not present

## 2024-06-26 DIAGNOSIS — I517 Cardiomegaly: Secondary | ICD-10-CM | POA: Insufficient documentation

## 2024-06-26 MED ORDER — NITROGLYCERIN 0.4 MG SL SUBL
0.8000 mg | SUBLINGUAL_TABLET | Freq: Once | SUBLINGUAL | Status: AC
  Administered 2024-06-26: 0.8 mg via SUBLINGUAL

## 2024-06-26 MED ORDER — IOHEXOL 350 MG/ML SOLN
100.0000 mL | Freq: Once | INTRAVENOUS | Status: AC | PRN
Start: 2024-06-26 — End: 2024-06-26
  Administered 2024-06-26: 100 mL via INTRAVENOUS

## 2024-06-26 NOTE — Progress Notes (Deleted)
 [facility]   [phone] [phone]    Patient Care Team: Toribio Jerel MATSU, MD as PCP - General (Family Medicine) Alvan Dorn FALCON, MD as PCP - Cardiology (Cardiology) Fernande Elspeth BROCKS, MD as PCP - Electrophysiology (Cardiology) Skeet Juliene SAUNDERS, DO as Consulting Physician (Neurology) Lanny Callander, MD as Consulting Physician (Hematology and Oncology) Hope Almarie ORN, NP as Nurse Practitioner (Pulmonary Disease)   CHIEF COMPLAINT:   Oncology History   No history exists.     CURRENT THERAPY:   INTERVAL HISTORY   ROS   Past Medical History:  Diagnosis Date   Abnormal pap 05/2012   ASCUS + HPV, 11/14 LGSIL   Allergy     Anemia    Anxiety    Asthma    Cardiac defibrillator in place    Chronic systolic CHF (congestive heart failure) (HCC)    a. 02/2015 Echo: EF 20%, sev dil w/ diff HK, worse @ inf base. Tiv AI, mild MR, mod dil LA, PASP .   Clotting disorder    DVT of lower extremity (deep venous thrombosis) (HCC)    Functional dyspepsia    early satiety    GERD with stricture    dilated 2010   HELICOBACTER PYLORI GASTRITIS 01/15/2009   dyspnea, ? reaction to Biaxin/amoxicillin  Pylera 01/15/09 - completed therapy      IBS (irritable bowel syndrome)    NICM (nonischemic cardiomyopathy) (HCC)    a. 02/2015 Cath: nl cors, EF 15%, mild PAH;  b. 02/2015 Enrolled in VEST trial.   Sickle cell trait    Situational depression    son died   Vitamin B 12 deficiency 11/2022     Past Surgical History:  Procedure Laterality Date   BIV ICD GENERATOR CHANGEOUT N/A 01/24/2021   Procedure: BIV ICD GENERATOR CHANGEOUT;  Surgeon: Fernande Elspeth BROCKS, MD;  Location: [facility] INVASIVE CV LAB;  Service: Cardiovascular;  Laterality: N/A;   CARDIAC CATHETERIZATION N/A 02/26/2015   Procedure: Right/Left Heart Cath and Coronary Angiography;  Surgeon: Debby DELENA Sor, MD;  Location: [facility] INVASIVE CV LAB;  Service: Cardiovascular;  Laterality: N/A;   COLPOSCOPY   09/08/2011   neg   EP IMPLANTABLE DEVICE N/A 10/14/2015   Procedure: BiV ICD Insertion CRT-D;  Surgeon: Elspeth BROCKS Fernande, MD;  Location: [facility] INVASIVE CV LAB;  Service: Cardiovascular;  Laterality: N/A;   ESOPHAGOGASTRODUODENOSCOPY (EGD) WITH ESOPHAGEAL DILATION  11/30/2008   esophageal ring dilated 54 French, H. pylori gastritis   HARVEST BONE GRAFT  02/2023   TUBAL LIGATION  09/07/1989   WISDOM TOOTH EXTRACTION     WRIST SURGERY  2023     Outpatient Encounter Medications as of 06/26/2024  Medication Sig   albuterol  (VENTOLIN  HFA) 108 (90 Base) MCG/ACT inhaler Inhale 1-2 puffs into the lungs every 4 (four) hours as needed.   amitriptyline  (ELAVIL ) 25 MG tablet TAKE 1 TABLET BY MOUTH EVERYDAY AT BEDTIME   amLODipine  (NORVASC ) 2.5 MG tablet Take 1 tablet (2.5 mg total) by mouth daily.   apixaban  (ELIQUIS ) 5 MG TABS tablet Take 1 tablet (5 mg total) by mouth 2 (two) times daily. Start taking after completion of starter pack.   Azelastine  HCl 137 MCG/SPRAY SOLN Place 2 sprays into both nostrils 2 (two) times daily.   carvedilol  (COREG ) 25 MG tablet Take 0.5 tablets (12.5 mg total) by mouth 2 (two) times daily with a meal.   clobetasol  ointment (TEMOVATE ) 0.05 % Apply 1 Application topically See admin instructions. Apply  to area with itching 3 times a day for a week then twice a day for 2 weeks then daily for 1 week then twice weekly for a week.  To use with only flares   conjugated estrogens  (PREMARIN ) vaginal cream Place 1 Applicatorful vaginally 3 (three) times a week.   Cyanocobalamin  (VITAMIN B-12 IJ) Inject as directed every 30 (thirty) days.   EPINEPHRINE  0.3 mg/0.3 mL IJ SOAJ injection INJECT 0.3 MG INTO THE MUSCLE AS NEEDED FOR ANAPHYLAXIS.   fexofenadine (ALLEGRA) 180 MG tablet Take 180 mg by mouth daily.   fluticasone  (FLONASE ) 50 MCG/ACT nasal spray Place 2 sprays into both nostrils every morning.   fluticasone -salmeterol (ADVAIR DISKUS) 250-50 MCG/ACT AEPB Inhale 1 puff into the lungs  in the morning.   furosemide  (LASIX ) 40 MG tablet Take 1 tablet (40 mg total) by mouth daily.   gabapentin (NEURONTIN) 100 MG capsule Take 1 tablet by mouth at night as tolerated   Galcanezumab -gnlm (EMGALITY ) 120 MG/ML SOAJ Inject 120 mg into the skin every 28 (twenty-eight) days.   metoprolol  tartrate (LOPRESSOR ) 100 MG tablet Take 1 tablet (100 mg total) by mouth once for 1 dose.   nitroGLYCERIN  (NITROSTAT ) 0.4 MG SL tablet Place 1 tablet (0.4 mg total) under the tongue every 5 (five) minutes x 3 doses as needed for chest pain.   pantoprazole  (PROTONIX ) 40 MG tablet Take 40 mg by mouth daily.   sacubitril -valsartan  (ENTRESTO ) 24-26 MG TAKE 1 TABLET BY MOUTH TWICE A DAY   sertraline  (ZOLOFT ) 100 MG tablet Take 100 mg by mouth daily.   spironolactone  (ALDACTONE ) 25 MG tablet TAKE 1 TABLET (25 MG TOTAL) BY MOUTH DAILY.   Vitamin D , Ergocalciferol , (DRISDOL) 1.25 MG (50000 UNIT) CAPS capsule Take 50,000 Units by mouth once a week.   No facility-administered encounter medications on file as of 06/26/2024.     There were no vitals filed for this visit. There is no height or weight on file to calculate BMI.   ECOG PERFORMANCE STATUS: {CHL ONC ECOG PS:[id number]}  PHYSICAL EXAM GENERAL:alert, no distress and comfortable SKIN: no rash  EYES: sclera clear NECK: without mass LYMPH:  no palpable cervical or supraclavicular lymphadenopathy  LUNGS: clear with normal breathing effort HEART: regular rate & rhythm, no lower extremity edema ABDOMEN: abdomen soft, non-tender and normal bowel sounds NEURO: alert & oriented x 3 with fluent speech, no focal motor/sensory deficits Breast exam:  PAC without erythema    CBC    Latest Ref Rng & Units 05/13/2024   12:17 AM 04/29/2024    1:33 PM 04/24/2024   12:50 PM  CBC  WBC 4.0 - 10.5 K/uL 6.4  5.8  5.2   Hemoglobin 12.0 - 15.0 g/dL 88.3  87.6  87.8   Hematocrit 36.0 - 46.0 % 35.1  37.4  36.3   Platelets 150 - 400 K/uL 188  204  225        CMP     Latest Ref Rng & Units 06/22/2024   10:01 AM 05/13/2024   12:17 AM 04/29/2024    1:33 PM  CMP  Glucose 70 - 99 mg/dL 97  92  897   BUN 6 - 20 mg/dL 10  11  12    Creatinine 0.44 - 1.00 mg/dL 9.05  8.89  8.90   Sodium 135 - 145 mmol/L 140  139  135   Potassium 3.5 - 5.1 mmol/L 4.0  3.6  4.1   Chloride 98 - 111 mmol/L 104  105  99  CO2 22 - 32 mmol/L 27  24  25    Calcium 8.9 - 10.3 mg/dL 9.7  9.0  9.7   Total Protein 6.5 - 8.1 g/dL   7.5   Total Bilirubin 0.0 - 1.2 mg/dL   0.7   Alkaline Phos 38 - 126 U/L   78   AST 15 - 41 U/L   31   ALT 0 - 44 U/L   29       ASSESSMENT & PLAN:  PLAN:  No orders of the defined types were placed in this encounter.     All questions were answered. The patient knows to call the clinic with any problems, questions or concerns. No barriers to learning were detected. I spent *** counseling the patient face to face. The total time spent in the appointment was *** and more than 50% was on counseling, review of test results, and coordination of care.   Rufina Kimery K Usha Slager, NP 06/26/2024 10:11 AM

## 2024-06-27 ENCOUNTER — Ambulatory Visit (HOSPITAL_COMMUNITY): Admitting: Physical Therapy

## 2024-06-27 DIAGNOSIS — M5459 Other low back pain: Secondary | ICD-10-CM | POA: Diagnosis not present

## 2024-06-27 DIAGNOSIS — M79605 Pain in left leg: Secondary | ICD-10-CM

## 2024-06-27 DIAGNOSIS — Z7409 Other reduced mobility: Secondary | ICD-10-CM

## 2024-06-27 NOTE — Therapy (Signed)
 OUTPATIENT PHYSICAL THERAPY THORACOLUMBAR TREATMENT   Patient Name: Mary Pratt MRN: [id number] DOB:07/07/1971, 53 y.o., female Today's Date: 06/27/2024    END OF SESSION:  PT End of Session - 06/27/24 1425     Visit Number 9    Number of Visits 13    Date for Recertification  07/03/24    Authorization Type [place] Medicaid Healthy Blue    Authorization Time Period healthy blue approved 5 visits from 06/22/2024-08/20/2024    Authorization - Visit Number 2    Authorization - Number of Visits 5    Progress Note Due on Visit 13    PT Start Time 1420    PT Stop Time 1500    PT Time Calculation (min) 40 min    Activity Tolerance Patient tolerated treatment well    Behavior During Therapy [facility] for tasks assessed/performed             Past Medical History:  Diagnosis Date   Abnormal pap 05/2012   ASCUS + HPV, 11/14 LGSIL   Allergy     Anemia    Anxiety    Asthma    Cardiac defibrillator in place    Chronic systolic CHF (congestive heart failure) (HCC)    a. 02/2015 Echo: EF 20%, sev dil w/ diff HK, worse @ inf base. Tiv AI, mild MR, mod dil LA, PASP .   Clotting disorder    DVT of lower extremity (deep venous thrombosis) (HCC)    Functional dyspepsia    early satiety    GERD with stricture    dilated 2010   HELICOBACTER PYLORI GASTRITIS 01/15/2009   dyspnea, ? reaction to Biaxin/amoxicillin  Pylera 01/15/09 - completed therapy      IBS (irritable bowel syndrome)    NICM (nonischemic cardiomyopathy) (HCC)    a. 02/2015 Cath: nl cors, EF 15%, mild PAH;  b. 02/2015 Enrolled in VEST trial.   Sickle cell trait    Situational depression    son died   Vitamin B 12 deficiency 11/2022   Past Surgical History:  Procedure Laterality Date   BIV ICD GENERATOR CHANGEOUT N/A 01/24/2021   Procedure: BIV ICD GENERATOR CHANGEOUT;  Surgeon: Fernande Elspeth BROCKS, MD;  Location: [facility] INVASIVE CV LAB;  Service: Cardiovascular;  Laterality: N/A;   CARDIAC CATHETERIZATION N/A 02/26/2015    Procedure: Right/Left Heart Cath and Coronary Angiography;  Surgeon: Debby DELENA Sor, MD;  Location: [facility] INVASIVE CV LAB;  Service: Cardiovascular;  Laterality: N/A;   COLPOSCOPY  09/08/2011   neg   EP IMPLANTABLE DEVICE N/A 10/14/2015   Procedure: BiV ICD Insertion CRT-D;  Surgeon: Elspeth BROCKS Fernande, MD;  Location: [facility] INVASIVE CV LAB;  Service: Cardiovascular;  Laterality: N/A;   ESOPHAGOGASTRODUODENOSCOPY (EGD) WITH ESOPHAGEAL DILATION  11/30/2008   esophageal ring dilated 54 French, H. 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   Diarrhea 08/21/2022   Right sided abdominal pain 08/21/2022   Chest pain with  moderate risk for cardiac etiology 06/24/2021   ICD (implantable cardioverter-defibrillator), biventricular, in situ 12/11/2019   Fatigue 02/16/2017   Chronic systolic heart failure (HCC) 06/12/2015   Nonischemic cardiomyopathy (HCC) 02/27/2015   Essential hypertension 02/27/2015   Cardiomyopathy (HCC)    Acute systolic heart failure (HCC) 02/22/2015   Functional dyspepsia 01/23/2011   Asthma 12/28/2007   GERD with stricture 12/28/2007    PCP: Toribio Jerel MATSU, MD  REFERRING PROVIDER: Onetha Kuba, MD  REFERRING DIAG: M51.26 (ICD-10-CM) - Other intervertebral disc displacement, lumbar region  Rationale for Evaluation and Treatment: Rehabilitation  THERAPY DIAG:  Other low back pain  Low back pain radiating to both legs  Impaired  functional mobility, balance, gait, and endurance  ONSET DATE: Several months  SUBJECTIVE:                                                                                                                                                                                           SUBJECTIVE STATEMENT: Pt reports she is 4/10 pain today.  States she got a CT of her heart but has not heard anything yet.  Reports compliance with HEP.     Reports cardiologist started her on new medication a few days ago. Unsure of name.  PERTINENT HISTORY:  DVT in January 2025, currently on blood thinners  PAIN:  Are you having pain? No and Yes: NPRS scale: 7/10 Pain location: Rt lower back and hip Pain description: achey, sometimes sharp Aggravating factors: walking, standing Relieving factors: rest  PRECAUTIONS: None  RED FLAGS: None   WEIGHT BEARING RESTRICTIONS: No  FALLS:  Has patient fallen in last 6 months? No  LIVING ENVIRONMENT: Stairs: Yes: External: 13 steps; can reach both Has following equipment at home: None  OCCUPATION: N/A  PLOF: Independent  PATIENT GOALS: To get where I can feel better and stand up longer  NEXT MD VISIT: Around October 23th  OBJECTIVE:  Note: Objective measures were completed at Evaluation unless otherwise noted.  DIAGNOSTIC FINDINGS:  IMPRESSION: CT lumbar spine:   1. In comparison to October 28, 2023 MRI lumbar spine, suspected increase in a right paracentral disc protrusion at L4-L5 with potentially moderate canal stenosis. Consider MRI of the lumbar spine to confirm and better characterize. 2. No evidence of acute fracture or malalignment.   CT abdomen/pelvis:   No evidence of acute abnormality.  PATIENT SURVEYS:  Modified Oswestry: Modified Oswestry Low Back Pain Disability Questionnaire: 35 / 50 = 70.0 %   Interpretation of scores: Score Category Description  0-20% Minimal Disability The patient can cope with most living activities.  Usually no treatment is indicated apart  from advice on lifting, sitting and exercise  21-40% Moderate Disability The patient experiences more pain and difficulty with sitting, lifting and standing. Travel and social life are more difficult and they may be disabled from work. Personal care, sexual activity and sleeping are not grossly affected, and the patient can usually be managed by conservative means  41-60% Severe Disability Pain remains the main problem in this group, but activities of daily living are affected. These patients require a detailed investigation  61-80% Crippled Back pain impinges on all aspects of the patient's life. Positive intervention is required  81-100% Bed-bound  These patients are either bed-bound or exaggerating their symptoms  Bluford FORBES Zoe DELENA Karon DELENA, et al. Surgery versus conservative management of stable thoracolumbar fracture: the PRESTO feasibility RCT. [place] ([place]): [employer]; 2021 Nov. [facility] Technology Assessment, No. 25.62.) Appendix 3, Oswestry Disability Index category descriptors. Available from: FindJewelers.cz  Minimally Clinically Important Difference (MCID) = 12.8%  06/15/24: Modified Oswestry Low Back Pain Disability Questionnaire: 26 / 50 = 52.0 %   COGNITION: Overall cognitive status: Within functional limits for tasks assessed     SENSATION: WFL   POSTURE: rounded shoulders, forward head, and decreased lumbar lordosis  PALPATION: TTP throughout thoracic   LUMBAR ROM:   AROM eval 06/15/24  Flexion (5 reps) WFL but inc pain in LE    Extension (5 reps) 25% avail, relieves LE pain but inc LBP  25% avail, sight pain in low back   Right lateral flexion    Left lateral flexion    Right rotation 50% pain in R side/low back and flank area 50% pain in R side/low back and flank area  Left rotation 50% avail, pain in R side/low back and flank area 50% pain in R side/low back and flank area    (Blank rows = not tested)   LOWER EXTREMITY MMT:    MMT Right eval Left eval R 06/15/24 L 06/15/24  Hip flexion 4- pain in lower leg 4- pain in L knee 4-, pain in R side of back  4, pain in R side of back  Hip extension 4-, LBP 4-, LBP 4- 4-  Hip abduction 4-, R sided pain 4-, LLE pain 4 4- * in LLE  Hip adduction      Hip internal rotation      Hip external rotation      Knee flexion      Knee extension      Ankle dorsiflexion 4+ 4+ 5 5  Ankle plantarflexion      Ankle inversion      Ankle eversion       (Blank rows = not tested)  LUMBAR SPECIAL TESTS:    FUNCTIONAL TESTS:  30 seconds chair stand test 2 minute walk test: 369 ft  30 seconds chair stand: 11 STS, reports inc low back and LE pain, 3/10 pain   06/15/24:  2 min walk test: 444 ft 30 second stand test: 13 STS, pain 2/10   GAIT: Distance walked: 369 ft Assistive device utilized: None Level of assistance: Complete Independence Comments: WNL  TREATMENT DATE:  06/27/24: Standing:   Shoulder rows, RTB, TA contraction, 2x10 Shoulder extension, RTB, TA contraction, 2x10 Paloff press normal BOS, RTB, 2x10 Lateral stepping along 70ft line with GTB 2RT Monster walking along 32ft line with GTB 2RT Forward step ups 6 2X 10 each with 1 UE assist Lateral step downs 6 2X10 each with 1 UE assist Sit to stand with sphere 10x2  cues for posture and TA contraction  06/22/24: Shoulder rows, RTB, TA contraction, 2x10 Shoulder extension, RTB, TA contraction, 2x10 Paloff press, RTB, 2x10 Sit to stand with 10 lb kettle bell, 10x, v cues for posture and TA contraction 10x w/ kettle bell lift to chest height Supine hamstring stretch, 3x30 Supine piriformis stretch, 2x30   06/20/24 Supine:  decompression with tband 10X each GTB  Bridge 10X  Bridge with abduction in extension with GTB 10X  SLR with abdominal bracing 10X each side Side stepping on 20 ft line with RTB 2RT Sit to stands no UE  10X   06/15/24: Recumbent bike, seat 7, level 2, while completing Modified Oswestry Progress Note:        30 second stand test 2 minute walk test Lumbar ROM LE MMT Review of goals and HEP  Squats to chair level, 10x Progress to standing on foam pad, 10x Side step + mini squat, 20 ftx4, verbal cues for form    PATIENT EDUCATION:  Education details: Person educated: Patient Education method: Medical illustrator Education comprehension: verbalized understanding and returned demonstration  HOME EXERCISE PROGRAM: 06/05/24 Decompression exercises 1-5, with theraband 1 and 2 Access Code: [email or web address] URL: [email or web address] Date: 05/19/2024 Prepared by: Rosaria Powell-Butler  Exercises - Lying Prone  - 2 x daily - 7 x weekly - 3 sets - 5 min hold - Supine Bridge  - 2 x daily - 7 x weekly - 3 sets - 10 reps - Standing Lumbar Extension at Wall - Forearms  - 2 x daily - 7 x weekly - 3 sets - 10 reps - Standing Hip Abduction with Counter Support  - 2 x daily - 7 x weekly - 3 sets - 10 reps   Exercises - Hooklying Hamstring Stretch with Strap  - 2 x daily - 7 x weekly - 3 sets - 30 hold - Supine Piriformis Stretch with Foot on Ground  - 2 x daily - 7 x weekly - 3 sets - 30 hold  ASSESSMENT:  CLINICAL IMPRESSION: Continued with focus on improving LE strength and stabilization.  Began lateral stepping and monster walking with thera Bands, cues for upright posturing to address weakness in back and larger steps for challenge.  Pt able to complete with overall good form .  Weighted sphere added to sit to stand activity with good control.  Forward and lateral step ups to address LE and glut weakness completed with good control as well.  No added exercises to HEP today.  Minimal rest breaks needed and no complaints of pain.  Overall, pt tolerated well.  Patient will continue to benefit from continued skilled physical therapy in order to address current deficits to  improve overall function and quality of life    OBJECTIVE IMPAIRMENTS: decreased activity tolerance, decreased endurance, decreased mobility, decreased ROM, decreased strength, improper body mechanics, postural dysfunction, and pain.   ACTIVITY LIMITATIONS: carrying, lifting, bending, sitting, standing, squatting, stairs, and transfers  PARTICIPATION LIMITATIONS: meal prep, cleaning, laundry, community activity, occupation, and yard work  PERSONAL FACTORS: N/A are also affecting patient's functional outcome.   REHAB POTENTIAL: Good  CLINICAL DECISION MAKING: Stable/uncomplicated  EVALUATION COMPLEXITY: Low   GOALS: Goals reviewed with patient? Yes  SHORT TERM GOALS: Target date: 06/09/24 Patient will be independent with performance of HEP to demonstrate adequate self management of symptoms.  Baseline: Reports at least 2x/wk Goal status: IN PROGRESS  2.   Patient will report at least a 25% improvement with function and/or pain reduction  overall since beginning PT. Baseline: Reports 10% improvement on 10/9 Goal status: IN PROGRESS    LONG TERM GOALS: Target date: 07/03/24 Patient will improve Modified Oswestry score by 12.8 % in order to demonstrate improved self-perceived disability and overall function while meeting MCID.  Baseline: Goal status: MET   2.  Patient will improve  30 second chair stand  test by at least 2 STS   in order to demonstrate improved LE strength/power required for functional tranfers. Baseline: 13 STS on 10/9 Goal status: MET   3.  Patient will improve 2 minute walk test by at least 30 ft in order to demonstrate improved LE endurance needed for community ambulation.  Baseline:  444 ft on 10/9 Goal status: MET   4.  Patient will report overall 50% improvement since beginning PT. Baseline:  Goal status: IN PROGRESS  5. Patient will be able to stand at least 12 minutes with reports of decreased pain, at least 4/10 or less, in order to demonstrate  improved activity tolerance needed for iADLs.  Baseline: Reports 6/10 pain when standing 30 minutes  Goal status: IN PROGRESS    PLAN:  PT FREQUENCY: 2x/week  PT DURATION: 8 weeks  PLANNED INTERVENTIONS: [id number]- PT Re-evaluation, 97110-Therapeutic exercises, 97530- Therapeutic activity, 97112- Neuromuscular re-education, 97535- Self Care, [id number]- Manual therapy, [id number]- Gait training, [id number]- Electrical stimulation (manual), [id number]- Traction (mechanical), [phone] (1-2 muscles), [id number] (3+ muscles)- Dry Needling, Patient/Family education, Balance training, Stair training, Taping, Joint mobilization, Spinal mobilization, Cryotherapy, and Moist heat.  PLAN FOR NEXT SESSION: Cont to progress lumbar mobility, LE strength and core strength.   2:25 PM, 06/27/24 Greig KATHEE Fuse, PTA/CLT [facility] Ph: [phone]

## 2024-06-28 ENCOUNTER — Ambulatory Visit
Admission: RE | Admit: 2024-06-28 | Discharge: 2024-06-28 | Disposition: A | Discharge status: Home / Self Care | Source: Ambulatory Visit | Attending: Nephrology | Admitting: Nephrology

## 2024-06-28 DIAGNOSIS — N182 Chronic kidney disease, stage 2 (mild): Secondary | ICD-10-CM

## 2024-06-29 ENCOUNTER — Encounter (HOSPITAL_COMMUNITY)

## 2024-06-30 ENCOUNTER — Ambulatory Visit (HOSPITAL_COMMUNITY)

## 2024-06-30 ENCOUNTER — Encounter (HOSPITAL_COMMUNITY): Payer: Self-pay

## 2024-06-30 DIAGNOSIS — Z7409 Other reduced mobility: Secondary | ICD-10-CM

## 2024-06-30 DIAGNOSIS — M5459 Other low back pain: Secondary | ICD-10-CM | POA: Diagnosis not present

## 2024-06-30 DIAGNOSIS — M79604 Pain in right leg: Secondary | ICD-10-CM

## 2024-06-30 NOTE — Therapy (Signed)
 OUTPATIENT PHYSICAL THERAPY THORACOLUMBAR TREATMENT   Patient Name: Mary Pratt MRN: [id number] DOB:1971/03/15, 53 y.o., female Today's Date: 06/30/2024    END OF SESSION:  PT End of Session - 06/30/24 1117     Visit Number 10    Number of Visits 13    Date for Recertification  07/03/24    Authorization Type [place] [employer]    Authorization Time Period healthy blue approved 5 visits from 06/22/2024-08/20/2024    Authorization - Visit Number 3    Authorization - Number of Visits 5    Progress Note Due on Visit 13    PT Start Time 1117    PT Stop Time 1155    PT Time Calculation (min) 38 min    Activity Tolerance Patient tolerated treatment well    Behavior During Therapy WFL for tasks assessed/performed              Past Medical History:  Diagnosis Date   Abnormal pap 05/2012   ASCUS + HPV, 11/14 LGSIL   Allergy     Anemia    Anxiety    Asthma    Cardiac defibrillator in place    Chronic systolic CHF (congestive heart failure) (HCC)    a. 02/2015 Echo: EF 20%, sev dil w/ diff HK, worse @ inf base. Tiv AI, mild MR, mod dil LA, PASP .   Clotting disorder    DVT of lower extremity (deep venous thrombosis) (HCC)    Functional dyspepsia    early satiety    GERD with stricture    dilated 2010   HELICOBACTER PYLORI GASTRITIS 01/15/2009   dyspnea, ? reaction to Biaxin/amoxicillin  Pylera 01/15/09 - completed therapy      IBS (irritable bowel syndrome)    NICM (nonischemic cardiomyopathy) (HCC)    a. 02/2015 Cath: nl cors, EF 15%, mild PAH;  b. 02/2015 Enrolled in VEST trial.   Sickle cell trait    Situational depression    son died   Vitamin B 12 deficiency 11/2022   Past Surgical History:  Procedure Laterality Date   BIV ICD GENERATOR CHANGEOUT N/A 01/24/2021   Procedure: BIV ICD GENERATOR CHANGEOUT;  Surgeon: Fernande Elspeth BROCKS, MD;  Location: [facility] INVASIVE CV LAB;  Service: Cardiovascular;  Laterality: N/A;   CARDIAC CATHETERIZATION N/A  02/26/2015   Procedure: Right/Left Heart Cath and Coronary Angiography;  Surgeon: Debby DELENA Sor, MD;  Location: [facility] INVASIVE CV LAB;  Service: Cardiovascular;  Laterality: N/A;   COLPOSCOPY  09/08/2011   neg   EP IMPLANTABLE DEVICE N/A 10/14/2015   Procedure: BiV ICD Insertion CRT-D;  Surgeon: Elspeth BROCKS Fernande, MD;  Location: [facility] INVASIVE CV LAB;  Service: Cardiovascular;  Laterality: N/A;   ESOPHAGOGASTRODUODENOSCOPY (EGD) WITH ESOPHAGEAL DILATION  11/30/2008   esophageal ring dilated 54 French, H. 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   Diarrhea 08/21/2022   Right sided abdominal pain 08/21/2022   Chest pain  with moderate risk for cardiac etiology 06/24/2021   ICD (implantable cardioverter-defibrillator), biventricular, in situ 12/11/2019   Fatigue 02/16/2017   Chronic systolic heart failure (HCC) 06/12/2015   Nonischemic cardiomyopathy (HCC) 02/27/2015   Essential hypertension 02/27/2015   Cardiomyopathy (HCC)    Acute systolic heart failure (HCC) 02/22/2015   Functional dyspepsia 01/23/2011   Asthma 12/28/2007   GERD with stricture 12/28/2007    PCP: Toribio Jerel MATSU, MD  REFERRING PROVIDER: Onetha Kuba, MD  REFERRING DIAG: M51.26 (ICD-10-CM) - Other intervertebral disc displacement, lumbar region  Rationale for Evaluation and Treatment: Rehabilitation  THERAPY DIAG:  Other low back pain  Low back pain radiating to both  legs  Impaired functional mobility, balance, gait, and endurance  ONSET DATE: Several months  SUBJECTIVE:                                                                                                                                                                                           SUBJECTIVE STATEMENT: Pt states back feels a little bit better but still a 4/10 pain reported this morning. Pt states HEP is going well and they got back with her about one of the heart appointments.    Reports cardiologist started her on new medication a few days ago. Unsure of name.  PERTINENT HISTORY:  DVT in January 2025, currently on blood thinners  PAIN:  Are you having pain? No and Yes: NPRS scale: 7/10 Pain location: Rt lower back and hip Pain description: achey, sometimes sharp Aggravating factors: walking, standing Relieving factors: rest  PRECAUTIONS: None  RED FLAGS: None   WEIGHT BEARING RESTRICTIONS: No  FALLS:  Has patient fallen in last 6 months? No  LIVING ENVIRONMENT: Stairs: Yes: External: 13 steps; can reach both Has following equipment at home: None  OCCUPATION: N/A  PLOF: Independent  PATIENT GOALS: To get where I can feel better and stand up longer  NEXT MD VISIT: Around October 23th  OBJECTIVE:  Note: Objective measures were completed at Evaluation unless otherwise noted.  DIAGNOSTIC FINDINGS:  IMPRESSION: CT lumbar spine:   1. In comparison to October 28, 2023 MRI lumbar spine, suspected increase in a right paracentral disc protrusion at L4-L5 with potentially moderate canal stenosis. Consider MRI of the lumbar spine to confirm and better characterize. 2. No evidence of acute fracture or malalignment.   CT abdomen/pelvis:   No evidence of acute abnormality.  PATIENT SURVEYS:  Modified Oswestry: Modified Oswestry Low Back Pain Disability Questionnaire: 35 / 50 = 70.0 %   Interpretation of scores: Score Category Description  0-20%  Minimal Disability The patient can cope with most living  activities. Usually no treatment is indicated apart from advice on lifting, sitting and exercise  21-40% Moderate Disability The patient experiences more pain and difficulty with sitting, lifting and standing. Travel and social life are more difficult and they may be disabled from work. Personal care, sexual activity and sleeping are not grossly affected, and the patient can usually be managed by conservative means  41-60% Severe Disability Pain remains the main problem in this group, but activities of daily living are affected. These patients require a detailed investigation  61-80% Crippled Back pain impinges on all aspects of the patient's life. Positive intervention is required  81-100% Bed-bound  These patients are either bed-bound or exaggerating their symptoms  Bluford FORBES Zoe DELENA Karon DELENA, et al. Surgery versus conservative management of stable thoracolumbar fracture: the PRESTO feasibility RCT. [place]): [employer]; 2021 Nov. [facility] Technology Assessment, No. 25.62.) Appendix 3, Oswestry Disability Index category descriptors. Available from: FindJewelers.cz  Minimally Clinically Important Difference (MCID) = 12.8%  06/15/24: Modified Oswestry Low Back Pain Disability Questionnaire: 26 / 50 = 52.0 %   COGNITION: Overall cognitive status: Within functional limits for tasks assessed     SENSATION: WFL   POSTURE: rounded shoulders, forward head, and decreased lumbar lordosis  PALPATION: TTP throughout thoracic   LUMBAR ROM:   AROM eval 06/15/24  Flexion (5 reps) WFL but inc pain in LE    Extension (5 reps) 25% avail, relieves LE pain but inc LBP  25% avail, sight pain in low back   Right lateral flexion    Left lateral flexion    Right rotation 50% pain in R side/low back and flank area 50% pain in R side/low back and flank area  Left rotation 50% avail, pain in R side/low  back and flank area 50% pain in R side/low back and flank area   (Blank rows = not tested)   LOWER EXTREMITY MMT:    MMT Right eval Left eval R 06/15/24 L 06/15/24  Hip flexion 4- pain in lower leg 4- pain in L knee 4-, pain in R side of back  4, pain in R side of back  Hip extension 4-, LBP 4-, LBP 4- 4-  Hip abduction 4-, R sided pain 4-, LLE pain 4 4- * in LLE  Hip adduction      Hip internal rotation      Hip external rotation      Knee flexion      Knee extension      Ankle dorsiflexion 4+ 4+ 5 5  Ankle plantarflexion      Ankle inversion      Ankle eversion       (Blank rows = not tested)  LUMBAR SPECIAL TESTS:    FUNCTIONAL TESTS:  30 seconds chair stand test 2 minute walk test: 369 ft  30 seconds chair stand: 11 STS, reports inc low back and LE pain, 3/10 pain   06/15/24:  2 min walk test: 444 ft 30 second stand test: 13 STS, pain 2/10   GAIT: Distance walked: 369 ft Assistive device utilized: None Level of assistance: Complete Independence Comments: WNL  TREATMENT DATE:  06/30/2024  Therapeutic Exercise: -Treadmill, 5 minutes, 2.0 grade, speed, 1.4>1.7, pt cued for proper placement on machine -Supine bridges 2 sets of 10 reps, 3 second holds, GTB at knees, pt cued for max hip extension Neuromuscular Re-education: -Bird dogs, 2 set of 7 reps, pt cued for increased ROM and neutral spine -Modified crunch, 2 set  of 7 reps, 3 second holds, pt cued for sequencing and set up -Side plank, 3 reps bilaterally, 10 second holds, pt cued for hip extension -LTR 1 set of 10 reps bilaterally, pt cued to remain in pain free ROM -Paloff Press, 1 set of 10 reps bilaterally, GTB at chest, pt cued for sequencing Therapeutic Activity: -Walking marches and lateral stepping 4lb ankle weights, 4 laps on 20 foot line, last 2 laps, with tidal tank column (slight increase in pain)  06/27/24: Standing:   Shoulder rows, RTB, TA contraction, 2x10 Shoulder extension, RTB, TA  contraction, 2x10 Paloff press normal BOS, RTB, 2x10 Lateral stepping along 51ft line with GTB 2RT Monster walking along 4ft line with GTB 2RT Forward step ups 6 2X 10 each with 1 UE assist Lateral step downs 6 2X10 each with 1 UE assist Sit to stand with sphere 10x2 cues for posture and TA contraction  06/22/24: Shoulder rows, RTB, TA contraction, 2x10 Shoulder extension, RTB, TA contraction, 2x10 Paloff press, RTB, 2x10 Sit to stand with 10 lb kettle bell, 10x, v cues for posture and TA contraction 10x w/ kettle bell lift to chest height Supine hamstring stretch, 3x30 Supine piriformis stretch, 2x30   PATIENT EDUCATION:  Education details: Person educated: Patient Education method: Medical illustrator Education comprehension: verbalized understanding and returned demonstration  HOME EXERCISE PROGRAM: 06/05/24 Decompression exercises 1-5, with theraband 1 and 2 Access Code: [email or web address] URL: [email or web address] Date: 06/30/2024 Prepared by: Lang Ada  Exercises - Lying Prone  - 2 x daily - 7 x weekly - 3 sets - 5 min hold - Supine Bridge  - 2 x daily - 7 x weekly - 3 sets - 10 reps - Standing Lumbar Extension at Wall - Forearms  - 2 x daily - 7 x weekly - 3 sets - 10 reps - Standing Hip Abduction with Counter Support  - 2 x daily - 7 x weekly - 3 sets - 10 reps - Supine Transversus Abdominis Bracing - Hands on Stomach  - 1 x daily - 7 x weekly - 3 sets - 10 reps - Supine Hip Adduction Isometric with Ball  - 1 x daily - 7 x weekly - 1 sets - 10 reps - 5 sec hold - Hooklying Isometric Hip Abduction with Belt  - 1 x daily - 7 x weekly - 1 sets - 10 reps - 5 sec hold - Lateral Shift Correction at Wall  - 1 x daily - 7 x weekly - 1 sets - 10 reps - Hooklying Hamstring Stretch with Strap  - 2 x daily - 7 x weekly - 3 sets - 30 hold - Supine Piriformis Stretch with Foot on Ground  - 2 x daily - 7 x weekly - 3 sets - 30 hold - Bird Dog  - 1 x daily - 7  x weekly - 3 sets - 10 reps - Neutral Curl Up with Straight Leg  - 1 x daily - 7 x weekly - 3 sets - 10 reps - 3 hold - Side Plank on Knees  - 1 x daily - 7 x weekly - 3 sets - 10 reps - 10 hold  ASSESSMENT:  CLINICAL IMPRESSION: Patient continues to demonstrate increased low back pain, decreased LE/core strength, decreased gait quality and balance. Patient also demonstrates fair endurance with aerobic based exercise during today's session on treadmill. Patient able to progress dynamic balance and core activation exercises today with bird dogs and side planks, good performance with verbal  cueing. Patient would continue to benefit from skilled physical therapy for increased endurance with ambulation, increased core/LE strength, and improved balance for improved quality of life, improved independence with management of low back and continued progress towards therapy goals.     OBJECTIVE IMPAIRMENTS: decreased activity tolerance, decreased endurance, decreased mobility, decreased ROM, decreased strength, improper body mechanics, postural dysfunction, and pain.   ACTIVITY LIMITATIONS: carrying, lifting, bending, sitting, standing, squatting, stairs, and transfers  PARTICIPATION LIMITATIONS: meal prep, cleaning, laundry, community activity, occupation, and yard work  PERSONAL FACTORS: N/A are also affecting patient's functional outcome.   REHAB POTENTIAL: Good  CLINICAL DECISION MAKING: Stable/uncomplicated  EVALUATION COMPLEXITY: Low   GOALS: Goals reviewed with patient? Yes  SHORT TERM GOALS: Target date: 06/09/24 Patient will be independent with performance of HEP to demonstrate adequate self management of symptoms.  Baseline: Reports at least 2x/wk Goal status: IN PROGRESS  2.   Patient will report at least a 25% improvement with function and/or pain reduction overall since beginning PT. Baseline: Reports 10% improvement on 10/9 Goal status: IN PROGRESS    LONG TERM GOALS:  Target date: 07/03/24 Patient will improve Modified Oswestry score by 12.8 % in order to demonstrate improved self-perceived disability and overall function while meeting MCID.  Baseline: Goal status: MET   2.  Patient will improve  30 second chair stand  test by at least 2 STS   in order to demonstrate improved LE strength/power required for functional tranfers. Baseline: 13 STS on 10/9 Goal status: MET   3.  Patient will improve 2 minute walk test by at least 30 ft in order to demonstrate improved LE endurance needed for community ambulation.  Baseline:  444 ft on 10/9 Goal status: MET   4.  Patient will report overall 50% improvement since beginning PT. Baseline:  Goal status: IN PROGRESS  5. Patient will be able to stand at least 12 minutes with reports of decreased pain, at least 4/10 or less, in order to demonstrate improved activity tolerance needed for iADLs.  Baseline: Reports 6/10 pain when standing 30 minutes  Goal status: IN PROGRESS    PLAN:  PT FREQUENCY: 2x/week  PT DURATION: 8 weeks  PLANNED INTERVENTIONS: 97164- PT Re-evaluation, 97110-Therapeutic exercises, 97530- Therapeutic activity, 97112- Neuromuscular re-education, 97535- Self Care, [id number]- Manual therapy, [id number]- Gait training, [phone]- Electrical stimulation (manual), [id number]- Traction (mechanical), [phone] (1-2 muscles), [id number] (3+ muscles)- Dry Needling, Patient/Family education, Balance training, Stair training, Taping, Joint mobilization, Spinal mobilization, Cryotherapy, and Moist heat.  PLAN FOR NEXT SESSION: Cont to progress lumbar mobility, LE strength and core strength.   Lang Ada, PT, DPT [place] [facility] Office: [phone] 12:01 PM, 06/30/24

## 2024-07-03 ENCOUNTER — Encounter (HOSPITAL_COMMUNITY): Payer: Self-pay

## 2024-07-03 ENCOUNTER — Ambulatory Visit (HOSPITAL_COMMUNITY)

## 2024-07-03 DIAGNOSIS — M545 Low back pain, unspecified: Secondary | ICD-10-CM

## 2024-07-03 DIAGNOSIS — M5459 Other low back pain: Secondary | ICD-10-CM | POA: Diagnosis not present

## 2024-07-03 DIAGNOSIS — Z7409 Other reduced mobility: Secondary | ICD-10-CM

## 2024-07-03 NOTE — Therapy (Signed)
 OUTPATIENT PHYSICAL THERAPY THORACOLUMBAR TREATMENT/PN/Discharge   Patient Name: Mary Pratt MRN: [id number] DOB:[birth year], 53 y.o., female Today's Date: 07/03/2024  PHYSICAL THERAPY DISCHARGE SUMMARY  Visits from Start of Care: 11  Current functional level related to goals / functional outcomes: See below   Remaining deficits: See below   Education / Equipment: HEP   Patient agrees to discharge. Patient goals were partially met. Patient is being discharged due to being pleased with the current functional level.   END OF SESSION:  PT End of Session - 07/03/24 1344     Visit Number 11    Number of Visits 13    Date for Recertification  --    Authorization Type [place] [employer]    Authorization Time Period healthy blue approved 5 visits from 06/22/2024-08/20/2024    Authorization - Visit Number 4    Authorization - Number of Visits 5    Progress Note Due on Visit 13    PT Start Time 1345   pt late arrival   PT Stop Time 1415    PT Time Calculation (min) 30 min    Activity Tolerance Patient tolerated treatment well    Behavior During Therapy [facility] for tasks assessed/performed              Past Medical History:  Diagnosis Date   Abnormal pap 05/2012   ASCUS + HPV, 11/14 LGSIL   Allergy     Anemia    Anxiety    Asthma    Cardiac defibrillator in place    Chronic systolic CHF (congestive heart failure) (HCC)    a. 02/2015 Echo: EF 20%, sev dil w/ diff HK, worse [place]. Tiv AI, mild MR, mod dil LA, PASP .   Clotting disorder    DVT of lower extremity (deep venous thrombosis) (HCC)    Functional dyspepsia    early satiety    GERD with stricture    dilated 2010   HELICOBACTER PYLORI GASTRITIS 01/15/2009   dyspnea, ? reaction to Biaxin/amoxicillin  Pylera 01/15/09 - completed therapy      IBS (irritable bowel syndrome)    NICM (nonischemic cardiomyopathy) (HCC)    a. 02/2015 Cath: nl cors, EF 15%, mild PAH;  b. 02/2015 Enrolled in VEST  trial.   Sickle cell trait    Situational depression    son died   Vitamin B 12 deficiency 11/2022   Past Surgical History:  Procedure Laterality Date   BIV ICD GENERATOR CHANGEOUT N/A 01/24/2021   Procedure: BIV ICD GENERATOR CHANGEOUT;  Surgeon: Fernande Elspeth BROCKS, MD;  Location[facility] INVAS[facility] CV LAB;  Service: Cardiovascular;  Laterality: N/A;   CARDIAC CATHETERIZATION N/A 02/26/2015   Procedure: Right/Left Heart Cath and Coronary Angiography;  Surgeon: Debby DELENA Sor, MD;  Location: [facility] INVASIVE CV LAB;  Service: Cardiovascular;  Laterality: N/A;   COLPOSCOPY  09/08/2011   neg   EP IMPLANTABLE DEVICE N/A 10/14/2015   Procedure: BiV ICD Insertion CRT-D;  Surgeon: Elspeth BROCKS Fernande, MD;  Location: [facility] INVASIVE CV LAB;  Service: Cardiovascular;  Laterality: N/A;   ESOPHAGOGASTRODUODENOSCOPY (EGD) WITH ESOPHAGEAL DILATION  11/30/2008   esophageal ring dilated 54 French, H. 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   Diarrhea 08/21/2022   Right sided abdominal pain 08/21/2022   Chest pain with moderate risk for cardiac etiology 06/24/2021   ICD (implantable cardioverter-defibrillator), biventricular, in situ 12/11/2019   Fatigue 02/16/2017   Chronic systolic heart failure (HCC) 06/12/2015   Nonischemic cardiomyopathy (HCC) 02/27/2015   Essential hypertension 02/27/2015   Cardiomyopathy (HCC)    Acute systolic heart failure (HCC) 02/22/2015    Functional dyspepsia 01/23/2011   Asthma 12/28/2007   GERD with stricture 12/28/2007    PCP: Toribio Jerel MATSU, MD  REFERRING PROVIDER: Onetha Kuba, MD  REFERRING DIAG: M51.26 (ICD-10-CM) - Other intervertebral disc displacement, lumbar region  Rationale for Evaluation and Treatment: Rehabilitation  THERAPY DIAG:  Other low back pain  Low back pain radiating to both legs  Impaired functional mobility, balance, gait, and endurance  ONSET DATE: Several months  SUBJECTIVE:                                                                                                                                                                                           SUBJECTIVE STATEMENT: Pt reports no back pain today. Reports she was in a little pain after last session so she didn't do much over the weekend but feeling good today.   Reports cardiologist started her on new medication a few days ago. Unsure of name.  PERTINENT HISTORY:  DVT in January 2025, currently on blood thinners  PAIN:  Are you having pain? No and Yes: NPRS scale: 7/10 Pain location: Rt lower back and hip Pain description: achey, sometimes sharp Aggravating factors: walking, standing Relieving factors: rest  PRECAUTIONS: None  RED FLAGS: None   WEIGHT BEARING RESTRICTIONS: No  FALLS:  Has patient fallen in last 6 months? No  LIVING ENVIRONMENT: Stairs: Yes: External: 13 steps; can reach both Has following equipment at home: None  OCCUPATION: N/A  PLOF: Independent  PATIENT GOALS: To get where I can feel better and stand up longer  NEXT MD VISIT: Around October 23th  OBJECTIVE:  Note: Objective measures were completed at Evaluation unless otherwise noted.  DIAGNOSTIC FINDINGS:  IMPRESSION: CT lumbar spine:   1. In comparison to October 28, 2023 MRI lumbar spine, suspected increase in a right paracentral disc protrusion at L4-L5 with potentially moderate canal stenosis. Consider MRI of the  lumbar spine to confirm and better characterize. 2.  No evidence of acute fracture or malalignment.   CT abdomen/pelvis:   No evidence of acute abnormality.  PATIENT SURVEYS:  Modified Oswestry: Modified Oswestry Low Back Pain Disability Questionnaire: 35 / 50 = 70.0 %   Interpretation of scores: Score Category Description  0-20% Minimal Disability The patient can cope with most living activities. Usually no treatment is indicated apart from advice on lifting, sitting and exercise  21-40% Moderate Disability The patient experiences more pain and difficulty with sitting, lifting and standing. Travel and social life are more difficult and they may be disabled from work. Personal care, sexual activity and sleeping are not grossly affected, and the patient can usually be managed by conservative means  41-60% Severe Disability Pain remains the main problem in this group, but activities of daily living are affected. These patients require a detailed investigation  61-80% Crippled Back pain impinges on all aspects of the patient's life. Positive intervention is required  81-100% Bed-bound  These patients are either bed-bound or exaggerating their symptoms  Bluford FORBES Zoe DELENA Karon DELENA, et al. Surgery versus conservative management of stable thoracolumbar fracture: the PRESTO feasibility RCT. [place] ([place]): [employer]; 2021 Nov. [facility] Technology Assessment, No. 25.62.) Appendix 3, Oswestry Disability Index category descriptors. Available from: Findjewelers.cz  Minimally Clinically Important Difference (MCID) = 12.8%  06/15/24: Modified Oswestry Low Back Pain Disability Questionnaire: 26 / 50 = 52.0 %  07/03/24: Modified Oswestry Low Back Pain Disability Questionnaire: 22 / 50 = 44.0 %    COGNITION: Overall cognitive status: Within functional limits for tasks assessed     SENSATION: WFL   POSTURE: rounded shoulders, forward head, and  decreased lumbar lordosis  PALPATION: TTP throughout thoracic   LUMBAR ROM:   AROM eval 06/15/24 07/03/24:   Flexion (5 reps) WFL but inc pain in LE     Extension (5 reps) 25% avail, relieves LE pain but inc LBP  25% avail, sight pain in low back  25% avail, painful  Right lateral flexion     Left lateral flexion     Right rotation 50% pain in R side/low back and flank area 50% pain in R side/low back and flank area 50% pain in R side/low back and flank area  Left rotation 50% avail, pain in R side/low back and flank area 50% pain in R side/low back and flank area 50% pain in R side/low back and flank area   (Blank rows = not tested)   LOWER EXTREMITY MMT:    MMT Right eval Left eval R 06/15/24 L 06/15/24 R 07/03/24: L 07/03/24  Hip flexion 4- pain in lower leg 4- pain in L knee 4-, pain in R side of back  4, pain in R side of back 4, Knee pain  4, Knee pain   Hip extension 4-, LBP 4-, LBP 4- 4- 4, hip pain  4, hip pain  Hip abduction 4-, R sided pain 4-, LLE pain 4 4- * in LLE 4+ 4+  Hip adduction        Hip internal rotation        Hip external rotation        Knee flexion        Knee extension        Ankle dorsiflexion 4+ 4+ 5 5    Ankle plantarflexion        Ankle inversion        Ankle eversion         (Blank rows =  not tested)  LUMBAR SPECIAL TESTS:    FUNCTIONAL TESTS:  30 seconds chair stand test 2 minute walk test: 369 ft  30 seconds chair stand: 11 STS, reports inc low back and LE pain, 3/10 pain   06/15/24:  2 min walk test: 444 ft 30 second stand test: 13 STS, pain 2/10   GAIT: Distance walked: 369 ft Assistive device utilized: None Level of assistance: Complete Independence Comments: WNL  TREATMENT DATE:  07/03/24: Review of goals Lumbar ROM LE MMT Modified Oswestry Review of newer HEP: Bird Dog, 5x each LE, v cues for rotation and TA contraction Neutral Curl Up with Straight Leg, 5x each side, 3 holds Side Plank on Knees, 10 holds, 3x  each side   06/30/2024  Therapeutic Exercise: -Treadmill, 5 minutes, 2.0 grade, speed, 1.4>1.7, pt cued for proper placement on machine -Supine bridges 2 sets of 10 reps, 3 second holds, GTB at knees, pt cued for max hip extension Neuromuscular Re-education: -Bird dogs, 2 set of 7 reps, pt cued for increased ROM and neutral spine -Modified crunch, 2 set of 7 reps, 3 second holds, pt cued for sequencing and set up -Side plank, 3 reps bilaterally, 10 second holds, pt cued for hip extension -LTR 1 set of 10 reps bilaterally, pt cued to remain in pain free ROM -Paloff Press, 1 set of 10 reps bilaterally, GTB at chest, pt cued for sequencing Therapeutic Activity: -Walking marches and lateral stepping 4lb ankle weights, 4 laps on 20 foot line, last 2 laps, with tidal tank column (slight increase in pain)  06/27/24: Standing:   Shoulder rows, RTB, TA contraction, 2x10 Shoulder extension, RTB, TA contraction, 2x10 Paloff press normal BOS, RTB, 2x10 Lateral stepping along 53ft line with GTB 2RT Monster walking along 13ft line with GTB 2RT Forward step ups 6 2X 10 each with 1 UE assist Lateral step downs 6 2X10 each with 1 UE assist Sit to stand with sphere 10x2 cues for posture and TA contraction    PATIENT EDUCATION:  Education details: Person educated: Patient Education method: Medical Illustrator Education comprehension: verbalized understanding and returned demonstration  HOME EXERCISE PROGRAM: 06/05/24 Decompression exercises 1-5, with theraband 1 and 2 Access Code: [email or web address] URL: [email or web address] Date: 06/30/2024 Prepared by: Lang Ada  Exercises - Lying Prone  - 2 x daily - 7 x weekly - 3 sets - 5 min hold - Supine Bridge  - 2 x daily - 7 x weekly - 3 sets - 10 reps - Standing Lumbar Extension at Wall - Forearms  - 2 x daily - 7 x weekly - 3 sets - 10 reps - Standing Hip Abduction with Counter Support  - 2 x daily - 7 x weekly - 3 sets - 10  reps - Supine Transversus Abdominis Bracing - Hands on Stomach  - 1 x daily - 7 x weekly - 3 sets - 10 reps - Supine Hip Adduction Isometric with Ball  - 1 x daily - 7 x weekly - 1 sets - 10 reps - 5 sec hold - Hooklying Isometric Hip Abduction with Belt  - 1 x daily - 7 x weekly - 1 sets - 10 reps - 5 sec hold - Lateral Shift Correction at Wall  - 1 x daily - 7 x weekly - 1 sets - 10 reps - Hooklying Hamstring Stretch with Strap  - 2 x daily - 7 x weekly - 3 sets - 30 hold - Supine Piriformis Stretch with Foot on Ground  -  2 x daily - 7 x weekly - 3 sets - 30 hold - Bird Dog  - 1 x daily - 7 x weekly - 3 sets - 10 reps - Neutral Curl Up with Straight Leg  - 1 x daily - 7 x weekly - 3 sets - 10 reps - 3 hold - Side Plank on Knees  - 1 x daily - 7 x weekly - 3 sets - 10 reps - 10 hold  ASSESSMENT:  CLINICAL IMPRESSION: Progress note performed this date. Although patient's lumbar ROM remains around similar levels, patient demonstrates improvements with self perceived function via Modified oswestry, and improvements with LE strength via MMT. Patient had previously met the objective goals set for her for functional testing and therefore was not tested with those this date. Patient met majority of objective goals, reporting an overall 20% improvement since starting PT. Patient agrees with discharge to HEP to maintain progress made. Importance of increasing HEP compliance stressed this date. Pt reporting understanding. Pt discharged.       OBJECTIVE IMPAIRMENTS: decreased activity tolerance, decreased endurance, decreased mobility, decreased ROM, decreased strength, improper body mechanics, postural dysfunction, and pain.   ACTIVITY LIMITATIONS: carrying, lifting, bending, sitting, standing, squatting, stairs, and transfers  PARTICIPATION LIMITATIONS: meal prep, cleaning, laundry, community activity, occupation, and yard work  PERSONAL FACTORS: N/A are also affecting patient's functional  outcome.   REHAB POTENTIAL: Good  CLINICAL DECISION MAKING: Stable/uncomplicated  EVALUATION COMPLEXITY: Low   GOALS: Goals reviewed with patient? Yes  SHORT TERM GOALS: Target date: 06/09/24 Patient will be independent with performance of HEP to demonstrate adequate self management of symptoms.  Baseline: Reports every other day compliance with HEP Goal status: NOT MET  2.   Patient will report at least a 25% improvement with function and/or pain reduction overall since beginning PT. Baseline: Reports 25% improvement on 10/27 Goal status: MET   LONG TERM GOALS: Target date: 07/03/24 Patient will improve Modified Oswestry score by 12.8 % in order to demonstrate improved self-perceived disability and overall function while meeting MCID.  Baseline: Goal status: MET   2.  Patient will improve  30 second chair stand  test by at least 2 STS   in order to demonstrate improved LE strength/power required for functional tranfers. Baseline: 13 STS on 10/9 Goal status: MET   3.  Patient will improve 2 minute walk test by at least 30 ft in order to demonstrate improved LE endurance needed for community ambulation.  Baseline:  444 ft on 10/9 Goal status: MET   4.  Patient will report overall 50% improvement since beginning PT. Baseline:  Goal status: NOT MET  5. Patient will be able to stand at least 12 minutes with reports of decreased pain, at least 4/10 or less, in order to demonstrate improved activity tolerance needed for iADLs.  Baseline: Reports 5/10 pain when standing 30 minutes  Goal status: ADEQUATE  FOR DISCHARGE    PLAN:  PT FREQUENCY: 2x/week  PT DURATION: 8 weeks  PLANNED INTERVENTIONS: 97164- PT Re-evaluation, 97110-Therapeutic exercises, 97530- Therapeutic activity, 97112- Neuromuscular re-education, 97535- Self Care, [id number]- Manual therapy, [id number]- Gait training, [phone]- Electrical stimulation (manual), [id number]- Traction (mechanical), [phone] (1-2 muscles), [id number] (3+  muscles)- Dry Needling, Patient/Family education, Balance training, Stair training, Taping, Joint mobilization, Spinal mobilization, Cryotherapy, and Moist heat.  PLAN FOR NEXT SESSION: Pt discharged    4:44 PM, 07/03/24 Rosaria Settler, PT, DPT [facility] [place]

## 2024-07-04 ENCOUNTER — Ambulatory Visit (HOSPITAL_COMMUNITY)
Admission: RE | Admit: 2024-07-04 | Discharge: 2024-07-04 | Disposition: A | Discharge status: Home / Self Care | Source: Ambulatory Visit | Attending: Physician Assistant | Admitting: Physician Assistant

## 2024-07-04 ENCOUNTER — Encounter (HOSPITAL_COMMUNITY): Payer: Self-pay

## 2024-07-04 DIAGNOSIS — N6002 Solitary cyst of left breast: Secondary | ICD-10-CM

## 2024-07-05 ENCOUNTER — Other Ambulatory Visit: Payer: Self-pay

## 2024-07-05 DIAGNOSIS — I739 Peripheral vascular disease, unspecified: Secondary | ICD-10-CM

## 2024-07-07 ENCOUNTER — Ambulatory Visit: Admitting: Urology

## 2024-07-10 ENCOUNTER — Telehealth: Payer: Self-pay | Admitting: Nurse Practitioner

## 2024-07-10 ENCOUNTER — Inpatient Hospital Stay: Attending: Oncology | Admitting: Nurse Practitioner

## 2024-07-10 ENCOUNTER — Inpatient Hospital Stay

## 2024-07-10 VITALS — BP 138/88 | HR 82 | Temp 97.5°F | Resp 17 | Wt 165.1 lb

## 2024-07-10 DIAGNOSIS — R700 Elevated erythrocyte sedimentation rate: Secondary | ICD-10-CM | POA: Diagnosis not present

## 2024-07-10 DIAGNOSIS — M79604 Pain in right leg: Secondary | ICD-10-CM | POA: Diagnosis not present

## 2024-07-10 DIAGNOSIS — Z86718 Personal history of other venous thrombosis and embolism: Secondary | ICD-10-CM | POA: Diagnosis present

## 2024-07-10 DIAGNOSIS — I8290 Acute embolism and thrombosis of unspecified vein: Secondary | ICD-10-CM

## 2024-07-10 DIAGNOSIS — I82492 Acute embolism and thrombosis of other specified deep vein of left lower extremity: Secondary | ICD-10-CM

## 2024-07-10 DIAGNOSIS — M542 Cervicalgia: Secondary | ICD-10-CM | POA: Diagnosis not present

## 2024-07-10 DIAGNOSIS — M79605 Pain in left leg: Secondary | ICD-10-CM | POA: Diagnosis not present

## 2024-07-10 DIAGNOSIS — I82442 Acute embolism and thrombosis of left tibial vein: Secondary | ICD-10-CM

## 2024-07-10 DIAGNOSIS — E538 Deficiency of other specified B group vitamins: Secondary | ICD-10-CM | POA: Diagnosis not present

## 2024-07-10 DIAGNOSIS — D573 Sickle-cell trait: Secondary | ICD-10-CM | POA: Insufficient documentation

## 2024-07-10 LAB — CMP (CANCER CENTER ONLY)
ALT: 19 U/L (ref 0–44)
AST: 18 U/L (ref 15–41)
Albumin: 4.2 g/dL (ref 3.5–5.0)
Alkaline Phosphatase: 82 U/L (ref 38–126)
Anion gap: 6 (ref 5–15)
BUN: 14 mg/dL (ref 6–20)
CO2: 26 mmol/L (ref 22–32)
Calcium: 9.6 mg/dL (ref 8.9–10.3)
Chloride: 106 mmol/L (ref 98–111)
Creatinine: 1.03 mg/dL — ABNORMAL HIGH (ref 0.44–1.00)
GFR, Estimated: >60 mL/min (ref >60)
Glucose, Bld: 94 mg/dL (ref 70–99)
Potassium: 4 mmol/L (ref 3.5–5.1)
Sodium: 138 mmol/L (ref 135–145)
Total Bilirubin: 0.4 mg/dL (ref 0.0–1.2)
Total Protein: 7.6 g/dL (ref 6.5–8.1)

## 2024-07-10 LAB — CBC WITH DIFFERENTIAL (CANCER CENTER ONLY)
Abs Immature Granulocytes: 0.01 K/uL (ref 0.00–0.07)
Basophils Absolute: 0 K/uL (ref 0.0–0.1)
Basophils Relative: 1 %
Eosinophils Absolute: 0.3 K/uL (ref 0.0–0.5)
Eosinophils Relative: 5 %
HCT: 35.3 % — ABNORMAL LOW (ref 36.0–46.0)
Hemoglobin: 12 g/dL (ref 12.0–15.0)
Immature Granulocytes: 0 %
Lymphocytes Relative: 39 %
Lymphs Abs: 2 K/uL (ref 0.7–4.0)
MCH: 26.5 pg (ref 26.0–34.0)
MCHC: 34 g/dL (ref 30.0–36.0)
MCV: 77.9 fL — ABNORMAL LOW (ref 80.0–100.0)
Monocytes Absolute: 0.4 K/uL (ref 0.1–1.0)
Monocytes Relative: 9 %
Neutro Abs: 2.3 K/uL (ref 1.7–7.7)
Neutrophils Relative %: 46 %
Platelet Count: 206 K/uL (ref 150–400)
RBC: 4.53 MIL/uL (ref 3.87–5.11)
RDW: 14.6 % (ref 11.5–15.5)
WBC Count: 5 K/uL (ref 4.0–10.5)
nRBC: 0 % (ref 0.0–0.2)

## 2024-07-10 NOTE — Progress Notes (Unsigned)
 [facility]   [phone] [phone]    Patient Care Team: Toribio Jerel MATSU, MD as PCP - General (Family Medicine) Alvan Dorn FALCON, MD as PCP - Cardiology (Cardiology) Fernande Elspeth BROCKS, MD (Inactive) as PCP - Electrophysiology (Cardiology) Skeet Juliene SAUNDERS, DO as Consulting Physician (Neurology) Lanny Callander, MD as Consulting Physician (Hematology and Oncology) Hope Almarie ORN, NP as Nurse Practitioner (Pulmonary Disease)  Date of service: 07/10/2024  CHIEF COMPLAINT: Follow-up history of DVT  CURRENT THERAPY: Eliquis  twice daily  INTERVAL HISTORY Ms. Hagadorn presents for follow-up.  She reports that her blood remains low.  She feels that she has poor circulation in her lower legs with leg pain going on over 1 year and neck pain for the past 6 months.  Denies recent fall or injury.  She is on Eliquis , no bleeding or signs of recurrent thrombosis.  She alternates between constipation and diarrhea.  Feels bloated.   ROS  All other systems reviewed and negative  Past Medical History:  Diagnosis Date   Abnormal pap 05/2012   ASCUS + HPV, 11/14 LGSIL   Allergy     Anemia    Anxiety    Asthma    Cardiac defibrillator in place    Chronic systolic CHF (congestive heart failure) (HCC)    a. 02/2015 Echo: EF 20%, sev dil w/ diff HK, worse @ inf base. Tiv AI, mild MR, mod dil LA, PASP .   Clotting disorder    DVT of lower extremity (deep venous thrombosis) (HCC)    Functional dyspepsia    early satiety    GERD with stricture    dilated 2010   HELICOBACTER PYLORI GASTRITIS 01/15/2009   dyspnea, ? reaction to Biaxin/amoxicillin  Pylera 01/15/09 - completed therapy      IBS (irritable bowel syndrome)    NICM (nonischemic cardiomyopathy) (HCC)    a. 02/2015 Cath: nl cors, EF 15%, mild PAH;  b. 02/2015 Enrolled in VEST trial.   Sickle cell trait    Situational depression    son died   Vitamin B 12 deficiency 11/2022     Past Surgical History:   Procedure Laterality Date   BIV ICD GENERATOR CHANGEOUT N/A 01/24/2021   Procedure: BIV ICD GENERATOR CHANGEOUT;  Surgeon: Fernande Elspeth BROCKS, MD;  Location: [facility] INVASIVE CV LAB;  Service: Cardiovascular;  Laterality: N/A;   CARDIAC CATHETERIZATION N/A 02/26/2015   Procedure: Right/Left Heart Cath and Coronary Angiography;  Surgeon: Debby DELENA Sor, MD;  Location: [facility] INVASIVE CV LAB;  Service: Cardiovascular;  Laterality: N/A;   COLPOSCOPY  09/08/2011   neg   EP IMPLANTABLE DEVICE N/A 10/14/2015   Procedure: BiV ICD Insertion CRT-D;  Surgeon: Elspeth BROCKS Fernande, MD;  Location: [facility] INVASIVE CV LAB;  Service: Cardiovascular;  Laterality: N/A;   ESOPHAGOGASTRODUODENOSCOPY (EGD) WITH ESOPHAGEAL DILATION  11/30/2008   esophageal ring dilated 54 French, H. pylori gastritis   HARVEST BONE GRAFT  02/2023   TUBAL LIGATION  09/07/1989   WISDOM TOOTH EXTRACTION     WRIST SURGERY  2023     Outpatient Encounter Medications as of 07/10/2024  Medication Sig   albuterol  (VENTOLIN  HFA) 108 (90 Base) MCG/ACT inhaler Inhale 1-2 puffs into the lungs every 4 (four) hours as needed.   amitriptyline  (ELAVIL ) 25 MG tablet TAKE 1 TABLET BY MOUTH EVERYDAY AT BEDTIME   amLODipine  (NORVASC ) 2.5 MG tablet Take 1 tablet (2.5 mg total) by mouth daily.   apixaban  (ELIQUIS ) 5 MG TABS  tablet Take 1 tablet (5 mg total) by mouth 2 (two) times daily. Start taking after completion of starter pack.   Azelastine  HCl 137 MCG/SPRAY SOLN Place 2 sprays into both nostrils 2 (two) times daily.   carvedilol  (COREG ) 25 MG tablet Take 0.5 tablets (12.5 mg total) by mouth 2 (two) times daily with a meal.   clobetasol  ointment (TEMOVATE ) 0.05 % Apply 1 Application topically See admin instructions. Apply to area with itching 3 times a day for a week then twice a day for 2 weeks then daily for 1 week then twice weekly for a week.  To use with only flares   conjugated estrogens  (PREMARIN ) vaginal cream Place 1 Applicatorful vaginally 3 (three) times a  week.   Cyanocobalamin  (VITAMIN B-12 IJ) Inject as directed every 30 (thirty) days.   EPINEPHRINE  0.3 mg/0.3 mL IJ SOAJ injection INJECT 0.3 MG INTO THE MUSCLE AS NEEDED FOR ANAPHYLAXIS.   fexofenadine (ALLEGRA) 180 MG tablet Take 180 mg by mouth daily.   fluticasone  (FLONASE ) 50 MCG/ACT nasal spray Place 2 sprays into both nostrils every morning.   fluticasone -salmeterol (ADVAIR DISKUS) 250-50 MCG/ACT AEPB Inhale 1 puff into the lungs in the morning.   furosemide  (LASIX ) 40 MG tablet Take 1 tablet (40 mg total) by mouth daily.   gabapentin (NEURONTIN) 100 MG capsule Take 1 tablet by mouth at night as tolerated   Galcanezumab -gnlm (EMGALITY ) 120 MG/ML SOAJ Inject 120 mg into the skin every 28 (twenty-eight) days.   metoprolol  tartrate (LOPRESSOR ) 100 MG tablet Take 1 tablet (100 mg total) by mouth once for 1 dose.   nitroGLYCERIN  (NITROSTAT ) 0.4 MG SL tablet Place 1 tablet (0.4 mg total) under the tongue every 5 (five) minutes x 3 doses as needed for chest pain.   pantoprazole  (PROTONIX ) 40 MG tablet Take 40 mg by mouth daily.   sacubitril -valsartan  (ENTRESTO ) 24-26 MG TAKE 1 TABLET BY MOUTH TWICE A DAY   sertraline  (ZOLOFT ) 100 MG tablet Take 100 mg by mouth daily.   spironolactone  (ALDACTONE ) 25 MG tablet TAKE 1 TABLET (25 MG TOTAL) BY MOUTH DAILY.   Vitamin D , Ergocalciferol , (DRISDOL) 1.25 MG (50000 UNIT) CAPS capsule Take 50,000 Units by mouth once a week.   No facility-administered encounter medications on file as of 07/10/2024.     Today's Vitals   07/10/24 1102 07/10/24 1126  BP: 138/88   Pulse: 82   Resp: 17   Temp: (!) 97.5 F (36.4 C)   TempSrc: Temporal   SpO2: 99%   Weight: 165 lb 1.6 oz (74.9 kg)   PainSc:  0-No pain   Body mass index is 31.2 kg/m.   ECOG PERFORMANCE STATUS: 0 - Asymptomatic  PHYSICAL EXAM GENERAL:alert, no distress and comfortable SKIN: no rash  EYES: sclera clear NECK: without mass LYMPH:  no palpable cervical or supraclavicular  lymphadenopathy  LUNGS: clear with normal breathing effort HEART: regular rate & rhythm, no lower extremity edema. No evidence of stasis changes over the lower legs  ABDOMEN: abdomen soft, non-tender and normal bowel sounds NEURO: alert & oriented x 3 with fluent speech, no focal motor/sensory deficits  CBC    Latest Ref Rng & Units 07/10/2024   10:38 AM 05/13/2024   12:17 AM 04/29/2024    1:33 PM  CBC  WBC 4.0 - 10.5 K/uL 5.0  6.4  5.8   Hemoglobin 12.0 - 15.0 g/dL 87.9  88.3  87.6   Hematocrit 36.0 - 46.0 % 35.3  35.1  37.4   Platelets 150 -  400 K/uL 206  188  204       CMP     Latest Ref Rng & Units 07/10/2024   10:38 AM 06/22/2024   10:01 AM 05/13/2024   12:17 AM  CMP  Glucose 70 - 99 mg/dL 94  97  92   BUN 6 - 20 mg/dL 14  10  11    Creatinine 0.44 - 1.00 mg/dL 8.96  9.05  8.89   Sodium 135 - 145 mmol/L 138  140  139   Potassium 3.5 - 5.1 mmol/L 4.0  4.0  3.6   Chloride 98 - 111 mmol/L 106  104  105   CO2 22 - 32 mmol/L 26  27  24    Calcium 8.9 - 10.3 mg/dL 9.6  9.7  9.0   Total Protein 6.5 - 8.1 g/dL 7.6     Total Bilirubin 0.0 - 1.2 mg/dL 0.4     Alkaline Phos 38 - 126 U/L 82     AST 15 - 41 U/L 18     ALT 0 - 44 U/L 19         ASSESSMENT & PLAN:  LLE DVT -Confirmed on doppler 10/24/23 - left anterior tibial vein  -DVT resolved on 12/28/23 doppler, and again no DVT seen bilaterally on 05/03/24 and she completed Eliquis  -Hypercoagulable work up (off Eliquis ) was abnormal: Mixing studies were positive for lupus anticoagulant however, confirmatory tests were negative.  Specifically, DRVVT mix was positive at 41.9.  DRVVT confirm testing was negative.  Hexagonal phase phospholipid is slightly elevated at 12.  PTT LA makes was positive.  PTT LA confirm was negative.  D-dimer was elevated at 1.64.  Previous testing for factor V Leiden and other genetic disorders causing increased propensity for blood clots were negative.   -It was recommended to restart Eliquis  indefinitely   -Further studies showed negative ANA and RF with elevated sed rate. She has been referred to rheumatology to r/o underlying condition    Bilateral leg pain, neck pain  We discussed that she may have chronic pain in the left leg where she had prior DVT, however, the etiology of the diffuse body pain is unclear and I do not suspect this is related to her prior DVT or h/o anemia, etc  -I recommend she discuss this with rheumatology  -She is concerned about poor circulation, I reviewed ABI 05/11/24 which were normal    Anemia, microcytosis, sickle cell trait -She has history of B12 deficiency, B12 level 167 on 11/18/22 - on monthly injections -Microcytosis 2/2 sickle cell trait as confirmed on Hgb fractionation/solubility panel showing Hgb A 58.3 and Hgb S 39.0  -Anemia currently resolved, she is not anemic or iron deficient on recent labs  -Recent Scr nearly normal, no evidence of significant CKD   Disposition:  Ms. Aries appears well. She currently has no evidence of chronic or recurrent thrombosis on recent dopplers and today's exam. She does not have typical clinical exam findings of peripheral vascular disease. I reviewed past studies and labs. No clinical concern for underlying bone marrow condition. I do not recommend further work up at this time.   I recommend to continue eliquis , she is tolerating well. I also recommend she keep her rheumatology appt scheduled later this month to r/o underlying autoimmune or connective tissue condition which could explain her prior DVT, current pain, and elevated sed rate. She agrees.   She agrees to routine f/up in 6 months. We are available to see her sooner  should she develop bleeding on eliquis , s/sx of recurrent thrombosis, or any other hematologic concerns.      All questions were answered. The patient knows to call the clinic with any problems, questions or concerns. No barriers to learning were detected. I spent 30 minutes counseling the patient  face to face. The total time spent in the appointment was 40 minutes and more than 50% was on counseling, review of test results, and coordination of care.   Heaven Meeker K Zuriyah Shatz, NP 07/11/2024

## 2024-07-10 NOTE — Telephone Encounter (Signed)
 Called the patient and she is aware of her time and dates.

## 2024-07-11 ENCOUNTER — Encounter: Payer: Self-pay | Admitting: Nurse Practitioner

## 2024-07-12 ENCOUNTER — Ambulatory Visit (HOSPITAL_COMMUNITY): Attending: Vascular Surgery

## 2024-07-12 ENCOUNTER — Ambulatory Visit: Attending: Vascular Surgery | Admitting: Vascular Surgery

## 2024-07-13 ENCOUNTER — Ambulatory Visit: Attending: Internal Medicine | Admitting: Internal Medicine

## 2024-07-13 ENCOUNTER — Encounter: Payer: Self-pay | Admitting: Internal Medicine

## 2024-07-13 VITALS — BP 121/72 | HR 76 | Temp 97.9°F | Resp 16 | Ht 61.0 in | Wt 164.9 lb

## 2024-07-13 DIAGNOSIS — E559 Vitamin D deficiency, unspecified: Secondary | ICD-10-CM | POA: Diagnosis present

## 2024-07-13 DIAGNOSIS — K222 Esophageal obstruction: Secondary | ICD-10-CM | POA: Diagnosis present

## 2024-07-13 DIAGNOSIS — L439 Lichen planus, unspecified: Secondary | ICD-10-CM | POA: Diagnosis present

## 2024-07-13 DIAGNOSIS — I82492 Acute embolism and thrombosis of other specified deep vein of left lower extremity: Secondary | ICD-10-CM | POA: Insufficient documentation

## 2024-07-13 DIAGNOSIS — K654 Sclerosing mesenteritis: Secondary | ICD-10-CM | POA: Diagnosis present

## 2024-07-13 DIAGNOSIS — K30 Functional dyspepsia: Secondary | ICD-10-CM | POA: Diagnosis present

## 2024-07-13 DIAGNOSIS — K219 Gastro-esophageal reflux disease without esophagitis: Secondary | ICD-10-CM | POA: Diagnosis present

## 2024-07-13 DIAGNOSIS — K130 Diseases of lips: Secondary | ICD-10-CM | POA: Diagnosis present

## 2024-07-13 DIAGNOSIS — R700 Elevated erythrocyte sedimentation rate: Secondary | ICD-10-CM | POA: Insufficient documentation

## 2024-07-13 DIAGNOSIS — I429 Cardiomyopathy, unspecified: Secondary | ICD-10-CM | POA: Insufficient documentation

## 2024-07-13 NOTE — Progress Notes (Signed)
 Office Visit Note  Patient: Mary Pratt             Date of Birth: 07-20-1971           MRN: [id number]             PCP: Mary Jerel MATSU, MD Referring: Hanford Powell BRAVO, NP Visit Date: 07/13/2024 Occupation: Data Unavailable  Subjective:  New Patient (Initial Visit) (Oncology/PCP referred her here)    Discussed the use of AI scribe software for clinical note transcription with the patient, who gave verbal consent to proceed.  History of Present Illness   Mary Pratt is a 53 year old female with anemia and sickle cell trait who presents for evaluation of blood clots and abnormal test results. She was referred by her hematology office.  Earlier this year, she experienced a blood clot in her leg without a clear cause. She has undergone ultrasound imaging and multiple blood tests, including a lupus anticoagulant test which showed borderline results. She had a sedimentation rate test that was higher than normal. She had tests for lupus and rheumatoid arthritis, which did not show abnormal results. She has been on blood thinners since the clot was discovered.  She has a history of anemia, which she attributes to having sickle cell trait, a condition she has had all her life. She denies any previous history of blood clots before this year.  She notes changes in her skin, describing it as 'real shiny' and 'wrinkled looking,' particularly on her hands. She also mentions a spot on her foot and a biopsy on her scalp due to burning sensations, which was diagnosed as lichen planus or lichen sclerosus. She was prescribed shampoo for her scalp, but the pain persists.  She experiences bloating in her stomach, which has been ongoing for over a year. She has been diagnosed with IBS and has undergone a colonoscopy, which was normal. She reports occasional constipation and diarrhea but is not on any medication for these symptoms.  She has been experiencing perimenopausal symptoms and is on  sertraline  which is partially helpful.  She describes joint pain and stiffness, particularly in her legs, knees, and feet, which she attributes to poor circulation. Her feet are often cold, and she experiences numbness in her hands and toes.  She reports feeling weak and lacking energy, with no appetite. She also mentions sores at the corners of her mouth since her scalp biopsy in January.  She has a history of working in engineer, production. She denies any specific joint injuries, fractures, or dislocations.        06/2024 B12 wnl Folate wnl Iron wnl  01/2024 Vit D 57  Activities of Daily Living:  Patient reports morning stiffness for  none.   Patient Reports nocturnal pain.  Difficulty dressing/grooming: Denies Difficulty climbing stairs: Reports Difficulty getting out of chair: Denies Difficulty using hands for taps, buttons, cutlery, and/or writing: Denies  Review of Systems  Constitutional:  Positive for fatigue.  HENT:  Positive for mouth sores and mouth dryness.   Eyes:  Positive for dryness.  Respiratory:  Positive for shortness of breath.   Cardiovascular:  Positive for chest pain and palpitations.  Gastrointestinal:  Positive for blood in stool, constipation and diarrhea.  Endocrine: Negative for increased urination.  Genitourinary:  Positive for involuntary urination.  Musculoskeletal:  Positive for joint pain, gait problem, joint pain, joint swelling, myalgias, muscle tenderness and myalgias. Negative for muscle weakness and morning stiffness.  Skin:  Positive for color change, rash and hair loss. Negative for sensitivity to sunlight.  Allergic/Immunologic: Negative for susceptible to infections.  Neurological:  Positive for dizziness and headaches.  Hematological:  Negative for swollen glands.  Psychiatric/Behavioral:  Positive for depressed mood and sleep disturbance. The patient is not nervous/anxious.     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   Diarrhea 08/21/2022   Right sided abdominal pain 08/21/2022   Chest pain with moderate risk for cardiac etiology 06/24/2021   ICD (implantable cardioverter-defibrillator), biventricular, in situ 12/11/2019   Fatigue 02/16/2017   Chronic systolic heart failure (HCC) 06/12/2015   Nonischemic cardiomyopathy (HCC) 02/27/2015   Essential hypertension 02/27/2015   Cardiomyopathy (HCC)    Acute systolic heart failure (HCC) 02/22/2015   Functional dyspepsia 01/23/2011   Asthma 12/28/2007   GERD with stricture 12/28/2007    Past Medical History:  Diagnosis Date   Abnormal pap 05/2012   ASCUS + HPV, 11/14 LGSIL   Allergy     Anemia    Anxiety    Asthma    Cardiac defibrillator in place    Chronic systolic CHF (congestive heart failure) (HCC)    a. 02/2015 Echo: EF 20%, sev dil w/ diff HK, worse @ inf base. Tiv AI, mild MR, mod dil LA, PASP .   Clotting disorder    DVT of lower extremity (deep venous thrombosis) (HCC)    Functional dyspepsia    early satiety    GERD with stricture    dilated 2010   HELICOBACTER PYLORI GASTRITIS 01/15/2009   dyspnea, ? reaction to Biaxin/amoxicillin  Pylera 01/15/09 - completed therapy      IBS (irritable bowel syndrome)    Iron deficiency    NICM (nonischemic cardiomyopathy) (HCC)    a. 02/2015 Cath: nl cors, EF 15%, mild PAH;  b. 02/2015 Enrolled in VEST trial.   Sickle cell trait    Situational  depression    son died   Vitamin B 12 deficiency 11/2022   Vitamin D  deficiency     Family History  Problem Relation Age of Onset   Diabetes Mother    Hypertension Mother    Hypothyroidism Mother    Prostate cancer Father 51   Breast cancer Maternal Aunt 50   Breast cancer Maternal Aunt 58   Breast cancer Maternal Aunt 73   Brain cancer Maternal Uncle 16   Brain cancer Maternal Uncle 42   Asthma Paternal Aunt    Prostate cancer Paternal Uncle 44   Prostate cancer Paternal Uncle 101   Prostate cancer Paternal Uncle 62   Lung cancer Maternal Grandmother 8   Eczema Daughter    Allergic rhinitis Daughter    Colon cancer Neg Hx    Colon polyps Neg Hx    Esophageal cancer Neg Hx    Past Surgical History:  Procedure Laterality Date   BIV ICD GENERATOR CHANGEOUT N/A 01/24/2021   Procedure: BIV ICD GENERATOR CHANGEOUT;  Surgeon: Fernande Standing  C, MD;  Location: [facility] INVASIVE CV LAB;  Service: Cardiovascular;  Laterality: N/A;   CARDIAC CATHETERIZATION N/A 02/26/2015   Procedure: Right/Left Heart Cath and Coronary Angiography;  Surgeon: Debby DELENA Sor, MD;  Location: [facility] INVASIVE CV LAB;  Service: Cardiovascular;  Laterality: N/A;   COLPOSCOPY  09/08/2011   neg   EP IMPLANTABLE DEVICE N/A 10/14/2015   Procedure: BiV ICD Insertion CRT-D;  Surgeon: Elspeth JAYSON Sage, MD;  Location: [facility] INVASIVE CV LAB;  Service: Cardiovascular;  Laterality: N/A;   ESOPHAGOGASTRODUODENOSCOPY (EGD) WITH ESOPHAGEAL DILATION  11/30/2008   esophageal ring dilated 54 French, H. 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  Cardiovascular:     Rate and Rhythm: Normal rate and regular rhythm.  Pulmonary:     Effort: Pulmonary effort is normal.     Breath sounds: Normal breath sounds.  Lymphadenopathy:     Cervical: No cervical adenopathy.  Skin:    General: Skin is warm and dry.     Comments: Hyperpigmentation on soles of feet Normal appearing nailfold capillaries  Neurological:     Mental Status: She is alert.  Psychiatric:        Mood and Affect: Mood normal.     Musculoskeletal Exam:  Neck full ROM no tenderness Shoulders full ROM no tenderness or swelling Elbows full ROM no tenderness or swelling Wrists full ROM no tenderness or swelling Fingers full ROM no tenderness or swelling Hip stiffness with decreased rotation ROM, no focal tenderness to pressure Knees full ROM no tenderness or swelling, bilateral patellofemoral crepitus Bilateral bunions and hammertoes deformities   Investigation: No additional findings.  Imaging: CUP PACEART REMOTE DEVICE CHECK Result Date: 07/23/2024 ICD Scheduled remote reviewed. Normal device function.  Presenting rhythm: AS-BIV paced.  HF diagnostics have been abnormal in this monitoring period. Next remote transmission per protocol. - CS, CVRS  CT Head Wo Contrast Result Date: 07/19/2024 EXAM: CT HEAD WITHOUT CONTRAST 07/19/2024 10:44:23 PM TECHNIQUE: CT of the head was  performed without the administration of intravenous contrast. Automated exposure control, iterative reconstruction, and/or weight based adjustment of the mA/kV was utilized to reduce the radiation dose to as low as reasonably achievable. COMPARISON: None available. CLINICAL HISTORY: Headache, new onset (Age >= 51y) Headache, new onset (Age >=  51y) FINDINGS: BRAIN AND VENTRICLES: No acute hemorrhage. No evidence of acute infarct. No hydrocephalus. No extra-axial collection. No mass effect or midline shift. ORBITS: No acute abnormality. SINUSES: No acute abnormality. SOFT TISSUES AND SKULL: No acute soft tissue abnormality. No skull fracture. IMPRESSION: 1. No acute intracranial abnormality. Electronically signed by: Pinkie Pebbles MD 07/19/2024 10:46 PM EST RP Workstation: [id number]   VAS US  ABI WITH/WO TBI Result Date: 07/19/2024  LOWER EXTREMITY DOPPLER STUDY Patient Name:  FLOREEN TEEGARDEN  Date of Exam:   07/19/2024 Medical Rec #: [id number]       Accession #:    [id number] Date of Birth: 06-Dec-1970      Patient Gender: F Patient Age:   37 years Exam Location:  [address] Procedure:      VAS US  ABI WITH/WO TBI Referring Phys: PENNE COLORADO --------------------------------------------------------------------------------  Indications: Peripheral artery disease. Patient reports constant pain throughout              both legs. Numbness in both feet. She reports all over body pain. High Risk Factors: Hypertension, no history of smoking.  Comparison Study: On 05/11/2024, a lower arterial Doppler showed an ABI of 1.14                   on the right and 1.10 on the left. Performing Technologist: Nanetta Shad RVT  Examination Guidelines: A complete evaluation includes at minimum, Doppler waveform signals and systolic blood pressure reading at the level of bilateral brachial, anterior tibial, and posterior tibial arteries, when vessel segments are accessible. Bilateral testing is considered an integral part of a  complete examination. Photoelectric Plethysmograph (PPG) waveforms and toe systolic pressure readings are included as required and additional duplex testing as needed. Limited examinations for reoccurring indications may be performed as noted.  ABI Findings: +---------+------------------+-----+---------+--------+ Right    Rt Pressure (mmHg)IndexWaveform Comment  +---------+------------------+-----+---------+--------+ Brachial 125                                      +---------+------------------+-----+---------+--------+ PTA      147               1.17 triphasic         +---------+------------------+-----+---------+--------+ DP       148               1.17 triphasic         +---------+------------------+-----+---------+--------+ Great Toe131               1.04 Normal            +---------+------------------+-----+---------+--------+ +---------+------------------+-----+---------+-------+ Left     Lt Pressure (mmHg)IndexWaveform Comment +---------+------------------+-----+---------+-------+ Brachial 126                                     +---------+------------------+-----+---------+-------+ PTA      148               1.17 triphasic        +---------+------------------+-----+---------+-------+ DP       147               1.17 triphasic        +---------+------------------+-----+---------+-------+ Great Toe161               1.28 Normal           +---------+------------------+-----+---------+-------+ +-------+-----------+-----------+------------+------------+  ABI/TBIToday's ABIToday's TBIPrevious ABIPrevious TBI +-------+-----------+-----------+------------+------------+ Right  1.17       1.04       1.14        .84          +-------+-----------+-----------+------------+------------+ Left   1.17       1.28       1.10        .98          +-------+-----------+-----------+------------+------------+  Bilateral ABIs and TBIs appear essentially  unchanged compared to prior study on 05/204.  Summary: Right: Resting right ankle-brachial index is within normal range. The right toe-brachial index is normal.  Left: Resting left ankle-brachial index is within normal range. The left toe-brachial index is normal.  *See table(s) above for measurements and observations.  Electronically signed by Penne Colorado MD on 07/19/2024 at 3:32:22 PM.    Final    MM 3D DIAGNOSTIC MAMMOGRAM UNILATERAL LEFT BREAST Result Date: 07/04/2024 CLINICAL DATA:  Short-term follow-up for a probably benign breast asymmetry and correlate left breast mass on sonography. Findings were initially assessed, as a recall from screening, on 12/30/2023. EXAM: DIGITAL DIAGNOSTIC UNILATERAL LEFT MAMMOGRAM WITH TOMOSYNTHESIS AND CAD; ULTRASOUND LEFT BREAST LIMITED TECHNIQUE: Left digital diagnostic mammography and breast tomosynthesis was performed. The images were evaluated with computer-aided detection. ; Targeted ultrasound examination of the left breast was performed. COMPARISON:  Previous exam(s). ACR Breast Density Category c: The breasts are heterogeneously dense, which may obscure small masses. FINDINGS: The asymmetry initially noted on the screening exam dated 12/13/2023 is less apparent. There are no defined masses, new areas of asymmetry, areas of architectural distortion or suspicious calcifications. Targeted left breast ultrasound is performed, showing an elongated, hypo to anechoic mass in the retroareolar left breast at 2 o'clock measuring 1.2 x 0.4 x 0.6 cm, without significant change from the prior exam. IMPRESSION: 1. Cystic appearing, probably benign left breast mass, likely a focally dilated duct, in the retroareolar left breast at 2 o'clock, felt to be a correlate for the original screening mammographic asymmetry. This is sonographically without change from the prior study. RECOMMENDATION: 1. Diagnostic bilateral mammography and left breast ultrasound in 6 months. I have  discussed the findings and recommendations with the patient. If applicable, a reminder letter will be sent to the patient regarding the next appointment. BI-RADS CATEGORY  3: Probably benign. Electronically Signed   By: Alm Parkins M.D.   On: 07/04/2024 11:57   US  LIMITED ULTRASOUND INCLUDING AXILLA LEFT BREAST  Result Date: 07/04/2024 CLINICAL DATA:  Short-term follow-up for a probably benign breast asymmetry and correlate left breast mass on sonography. Findings were initially assessed, as a recall from screening, on 12/30/2023. EXAM: DIGITAL DIAGNOSTIC UNILATERAL LEFT MAMMOGRAM WITH TOMOSYNTHESIS AND CAD; ULTRASOUND LEFT BREAST LIMITED TECHNIQUE: Left digital diagnostic mammography and breast tomosynthesis was performed. The images were evaluated with computer-aided detection. ; Targeted ultrasound examination of the left breast was performed. COMPARISON:  Previous exam(s). ACR Breast Density Category c: The breasts are heterogeneously dense, which may obscure small masses. FINDINGS: The asymmetry initially noted on the screening exam dated 12/13/2023 is less apparent. There are no defined masses, new areas of asymmetry, areas of architectural distortion or suspicious calcifications. Targeted left breast ultrasound is performed, showing an elongated, hypo to anechoic mass in the retroareolar left breast at 2 o'clock measuring 1.2 x 0.4 x 0.6 cm, without significant change from the prior exam. IMPRESSION: 1. Cystic appearing, probably benign left breast mass, likely a focally dilated duct, in the retroareolar  left breast at 2 o'clock, felt to be a correlate for the original screening mammographic asymmetry. This is sonographically without change from the prior study. RECOMMENDATION: 1. Diagnostic bilateral mammography and left breast ultrasound in 6 months. I have discussed the findings and recommendations with the patient. If applicable, a reminder letter will be sent to the patient regarding the next  appointment. BI-RADS CATEGORY  3: Probably benign. Electronically Signed   By: Alm Parkins M.D.   On: 07/04/2024 11:57   CT CORONARY MORPH W/CTA COR W/SCORE W/CA W/CM &/OR WO/CM Addendum Date: 07/02/2024 ADDENDUM REPORT: 07/02/2024 20:18 EXAM: OVER-READ INTERPRETATION  CT CHEST The following report is an over-read performed by radiologist Dr. Andrea Gasman of [facility] on 07/02/2024. This over-read does not include interpretation of cardiac or coronary anatomy or pathology. The coronary CTA interpretation by the cardiologist is attached. COMPARISON:  Chest CT 04/29/2024 FINDINGS: Vascular: No aortic atherosclerosis. The included aorta is normal in caliber. Pacemaker wires are partially included. Mediastinum/nodes: No adenopathy or mass. Unremarkable esophagus. Lungs: No focal airspace disease. No pulmonary nodule. No pleural fluid. The included airways are patent. Upper abdomen: No acute or unexpected findings. Unchanged cysts in the liver. Musculoskeletal: There are no acute or suspicious osseous abnormalities. IMPRESSION: No acute or unexpected extracardiac findings. Electronically Signed   By: Andrea Gasman M.D.   On: 07/02/2024 20:18   Result Date: 07/02/2024 CLINICAL DATA:  Chest pain EXAM: Cardiac/Coronary CTA TECHNIQUE: A non-contrast, gated CT scan was obtained with axial slices of 2.5 mm through the heart for calcium scoring. Calcium scoring was performed using the Agatston method. A 120 kV prospective, gated, contrast cardiac CT scan was obtained. Gantry rotation speed was 230 msec and collimation was 0.63 mm. Two sublingual nitroglycerin  tablets (0.8 mg) were given. The 3D data set was reconstructed with motion correction for the best systolic or diastolic phase. Images were analyzed on a dedicated workstation using MPR, MIP, and VRT modes. The patient received 95 cc of contrast. FINDINGS: Image quality: Excellent. Noise artifact is: Limited. Coronary Arteries:  Normal  coronary origin.  Right dominance. Left main: The left main is a large caliber vessel with a normal take off from the left coronary cusp that bifurcates to form a left anterior descending artery and a left circumflex artery. There is no plaque or stenosis. Left anterior descending artery: The proximal LAD contains minimal non-calcified plaque (<25%). The mid and distal segments are patent. The LAD gives off 2 patent diagonal branches. Left circumflex artery: The LCX is non-dominant. There is minimal non-calcified plaque (<25%). The LCX gives off 2 patent obtuse marginal branches. Right coronary artery: The RCA is dominant with normal take off from the right coronary cusp. There is no evidence of plaque or stenosis. The RCA terminates as a PDA without evidence of plaque or stenosis. Right Atrium: Right atrial size is within normal limits. CIED leads are present that terminate in the RA/RV/lateral cardiac vein. Right Ventricle: The right ventricular cavity is within normal limits. Left Atrium: Left atrial size is normal in size with no left atrial appendage filling defect. Left Ventricle: The ventricular cavity size is within normal limits. Pulmonary arteries: Dilated pulmonary artery suggestive of pulmonary hypertension. Pulmonary veins: Normal pulmonary venous drainage. Pericardium: Normal thickness without significant effusion or calcium present. Cardiac valves: The aortic valve is trileaflet without significant calcification. The mitral valve is normal without significant calcification. Aorta: Normal caliber without significant disease. Extra-cardiac findings: See attached radiology report for non-cardiac structures. IMPRESSION: 1.  Coronary calcium score of 0. 2. Normal coronary origin with right dominance. 3. Minimal non-calcified plaque (<25%) in the LAD/LCX. 4. Dilated pulmonary artery suggestive of pulmonary hypertension. RECOMMENDATIONS: 1. CAD-RADS 1: Minimal non-obstructive CAD (0-24%). Consider  non-atherosclerotic causes of chest pain. Consider preventive therapy and risk factor modification. Darryle Decent, MD Electronically Signed: By: Darryle Decent M.D. On: 06/26/2024 20:34   US  RENAL Result Date: 06/29/2024 CLINICAL DATA:  CKD STAGE 2, GFR 60-89 ML/MIN EXAM: RENAL / URINARY TRACT ULTRASOUND COMPLETE COMPARISON:  05/13/2024 FINDINGS: Right Kidney: Renal measurements: 9.4 x 4.4 x 4.3 cm = volume: 93 mL. Slight increased echogenicity. Normal cortical thickness. No acute finding or hydronephrosis. Tiny upper pole simple anechoic cyst measures 11 mm. Left Kidney: Renal measurements: 10.0 x 4.4 x 4.2 cm = volume: 96 mL. Also mild increased echogenicity. Normal cortical thickness. No acute finding or hydronephrosis. Bladder: Appears normal for degree of bladder distention. Ureteral jets demonstrated bilaterally. Other: None. IMPRESSION: 1. Mild increased renal echogenicity compatible with medical renal disease. 2. No acute finding or hydronephrosis. Electronically Signed   By: CHRISTELLA.  Shick M.D.   On: 06/29/2024 19:39    Recent Labs: Lab Results  Component Value Date   WBC 5.2 07/19/2024   HGB 12.0 07/19/2024   PLT 214 07/19/2024   NA 141 07/19/2024   K 4.0 07/19/2024   CL 104 07/19/2024   CO2 27 07/19/2024   GLUCOSE 81 07/19/2024   BUN 10 07/19/2024   CREATININE 0.91 07/19/2024   BILITOT 0.4 07/19/2024   ALKPHOS 95 07/19/2024   AST 25 07/19/2024   ALT 23 07/19/2024   PROT 7.4 07/19/2024   ALBUMIN 4.3 07/19/2024   CALCIUM 9.7 07/19/2024   GFRAA 87 12/15/2019    Speciality Comments: No specialty comments available.  Procedures:  No procedures performed Allergies: Clarithromycin, Amoxicillin, Meloxicam, Spiriva respimat [tiotropium bromide], and Symbicort  [budesonide -formoterol  fumarate]   Assessment / Plan:     Visit Diagnoses: Elevated sed rate - Plan: Sedimentation rate, C-reactive protein, C3 and C4, Cyclic citrul peptide antibody, IgG, CK Acute deep vein thrombosis (DVT)  of other specified vein of left lower extremity (HCC) Unprovoked DVT with borderline lupus anticoagulant and elevated sedimentation rate suggests possible autoimmune or inflammatory etiology. Differential includes autoimmune or inflammatory causes, but no definitive diagnosis yet. - Rechecked sedimentation rate. - Ordered blood tests including complements and CCP antibody. - Reviewed previous test results.  Sclerosing mesenteritis (HCC) Unclear history with this. Not described on most recent imaging. No focal complaints at this time.  Angular cheilitis - Plan: Zinc Angular cheilitis Cracks at mouth corners possibly due to dryness, fungal infection, or deficiencies. Potential link to nutrient absorption issues from IBS. Reviewed previous labs normal for iron, B12, folate. 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  Muscular stiffness of shoulders, back, and hips Muscular stiffness with limited range of motion due to tightness and over-engagement of back muscles.  Chronic bloating and irritable bowel syndrome Chronic bloating and IBS with potential nutrient absorption issues.  Stage 2 chronic kidney disease Stage 2 CKD with no kidney-related symptoms. Concerns about supplement impact on kidney function. - Consider kidney function when evaluating supplements.       Orders: Orders Placed This Encounter  Procedures   Zinc   Sedimentation rate   C-reactive protein   C3 and C4   Cyclic citrul peptide antibody, IgG  CK   No orders of the defined types were placed in this encounter.   Follow-Up Instructions: No follow-ups on file.   Lonni LELON Ester, MD  Note - This record has been created using [employer].  Chart creation errors have been sought, but may not always  have been located. Such creation errors do not  reflect on  the standard of medical care.

## 2024-07-18 ENCOUNTER — Telehealth: Payer: Self-pay | Admitting: Internal Medicine

## 2024-07-18 NOTE — Telephone Encounter (Signed)
 Pt is requesting lab results and would like to know what her next steps will be.

## 2024-07-19 ENCOUNTER — Encounter (HOSPITAL_COMMUNITY): Payer: Self-pay | Admitting: *Deleted

## 2024-07-19 ENCOUNTER — Encounter: Payer: Self-pay | Admitting: Vascular Surgery

## 2024-07-19 ENCOUNTER — Other Ambulatory Visit: Payer: Self-pay

## 2024-07-19 ENCOUNTER — Emergency Department (HOSPITAL_COMMUNITY)
Admission: EM | Admit: 2024-07-19 | Discharge: 2024-07-19 | Disposition: A | Discharge status: Home / Self Care | Attending: Emergency Medicine | Admitting: Emergency Medicine

## 2024-07-19 ENCOUNTER — Emergency Department (HOSPITAL_COMMUNITY)

## 2024-07-19 ENCOUNTER — Ambulatory Visit: Admitting: Vascular Surgery

## 2024-07-19 ENCOUNTER — Ambulatory Visit (HOSPITAL_COMMUNITY)
Admission: RE | Admit: 2024-07-19 | Discharge: 2024-07-19 | Disposition: A | Discharge status: Home / Self Care | Source: Ambulatory Visit | Attending: Vascular Surgery | Admitting: Vascular Surgery

## 2024-07-19 VITALS — BP 121/86 | HR 72 | Temp 98.0°F | Ht 61.0 in | Wt 163.0 lb

## 2024-07-19 DIAGNOSIS — R519 Headache, unspecified: Secondary | ICD-10-CM | POA: Diagnosis not present

## 2024-07-19 DIAGNOSIS — Z862 Personal history of diseases of the blood and blood-forming organs and certain disorders involving the immune mechanism: Secondary | ICD-10-CM | POA: Diagnosis not present

## 2024-07-19 DIAGNOSIS — Z79899 Other long term (current) drug therapy: Secondary | ICD-10-CM | POA: Diagnosis not present

## 2024-07-19 DIAGNOSIS — Z7901 Long term (current) use of anticoagulants: Secondary | ICD-10-CM | POA: Diagnosis not present

## 2024-07-19 DIAGNOSIS — M25561 Pain in right knee: Secondary | ICD-10-CM | POA: Insufficient documentation

## 2024-07-19 DIAGNOSIS — I739 Peripheral vascular disease, unspecified: Secondary | ICD-10-CM | POA: Insufficient documentation

## 2024-07-19 DIAGNOSIS — M25562 Pain in left knee: Secondary | ICD-10-CM | POA: Insufficient documentation

## 2024-07-19 DIAGNOSIS — M79604 Pain in right leg: Secondary | ICD-10-CM

## 2024-07-19 LAB — COMPREHENSIVE METABOLIC PANEL WITH GFR
ALT: 23 U/L (ref 0–44)
AST: 25 U/L (ref 15–41)
Albumin: 4.3 g/dL (ref 3.5–5.0)
Alkaline Phosphatase: 95 U/L (ref 38–126)
Anion gap: 9 (ref 5–15)
BUN: 10 mg/dL (ref 6–20)
CO2: 27 mmol/L (ref 22–32)
Calcium: 9.7 mg/dL (ref 8.9–10.3)
Chloride: 104 mmol/L (ref 98–111)
Creatinine, Ser: 0.91 mg/dL (ref 0.44–1.00)
GFR, Estimated: >60 mL/min (ref >60)
Glucose, Bld: 81 mg/dL (ref 70–99)
Potassium: 4 mmol/L (ref 3.5–5.1)
Sodium: 141 mmol/L (ref 135–145)
Total Bilirubin: 0.4 mg/dL (ref 0.0–1.2)
Total Protein: 7.4 g/dL (ref 6.5–8.1)

## 2024-07-19 LAB — CBC
HCT: 36.6 % (ref 36.0–46.0)
Hemoglobin: 12 g/dL (ref 12.0–15.0)
MCH: 26.5 pg (ref 26.0–34.0)
MCHC: 32.8 g/dL (ref 30.0–36.0)
MCV: 81 fL (ref 80.0–100.0)
Platelets: 214 K/uL (ref 150–400)
RBC: 4.52 MIL/uL (ref 3.87–5.11)
RDW: 14.6 % (ref 11.5–15.5)
WBC: 5.2 K/uL (ref 4.0–10.5)
nRBC: 0 % (ref 0.0–0.2)

## 2024-07-19 LAB — C3 AND C4
C3 Complement: 150 mg/dL (ref 83–193)
C4 Complement: 24 mg/dL (ref 15–57)

## 2024-07-19 LAB — SEDIMENTATION RATE: Sed Rate: 28 mm/h (ref 0–30)

## 2024-07-19 LAB — VAS US ABI WITH/WO TBI
Left ABI: 1.17
Right ABI: 1.17

## 2024-07-19 LAB — C-REACTIVE PROTEIN: CRP: <3 mg/L (ref <8.0)

## 2024-07-19 LAB — CYCLIC CITRUL PEPTIDE ANTIBODY, IGG: Cyclic Citrullin Peptide Ab: <16 U

## 2024-07-19 LAB — ZINC: Zinc: 57 ug/dL — ABNORMAL LOW (ref 60–130)

## 2024-07-19 LAB — CK: Total CK: 71 U/L (ref 21–240)

## 2024-07-19 MED ORDER — OXYCODONE-ACETAMINOPHEN 5-325 MG PO TABS
1.0000 | ORAL_TABLET | Freq: Once | ORAL | Status: AC
  Administered 2024-07-19: 1 via ORAL
  Filled 2024-07-19: qty 1

## 2024-07-19 MED ORDER — ONDANSETRON 8 MG PO TBDP
8.0000 mg | ORAL_TABLET | Freq: Once | ORAL | Status: AC
  Administered 2024-07-19: 8 mg via ORAL
  Filled 2024-07-19: qty 1

## 2024-07-19 MED ORDER — TRAMADOL HCL 50 MG PO TABS: 50.0000 mg | ORAL_TABLET | Freq: Four times a day (QID) | ORAL | 0 refills | Status: AC | PRN

## 2024-07-19 NOTE — Discharge Instructions (Addendum)
 It was our pleasure to provide your ER care today - we hope that you feel better.  Overall, your lab work and CT scan look good/normal.   Drink plenty of fluids/stay well hydrated. Take acetaminophen  or ibuprofen as need. You may also take ultram as need for pain - no driving when taking.  Follow up closely with primary care doctor in the coming week.   Return to ER if worse, new symptoms, fevers, new/severe pain, severe headache, chest pain, trouble breathing, numbness/weakness, severe leg swelling, or other concern.

## 2024-07-19 NOTE — ED Triage Notes (Signed)
 Pt here for sickle cell pain for weeks- states pain to her head, neck, left shoulder, abd pain and left leg. Pt states she has sickle cell trait.

## 2024-07-19 NOTE — ED Provider Notes (Signed)
 [place] EMERGENCY DEPARTMENT AT [facility] Provider Note   CSN: [id number] Arrival date & time: 07/19/24  1843     Patient presents with: myalgia (history of sickle cell trait)   Mary Pratt is a 53 y.o. female.   Pt with hx sickle cell trait, c/o chronic pain to bil knees/legs, as well as frontal headache. Symptoms present in past few weeks, dull, non radiating, without specific exacerbating or alleviating factors. With headache, gradual onset, without abrupt worsening. Hx migraines but seems different. Denies head injury, trauma or fall. No syncope. Denies eye pain, redness or tearing. No sinus area pain or drainage. No uri symptoms. No fever or chills. Denies neck pain or stiffness. No wt loss. No change in pain by position or time of day. No change in vision or speech. No numbness or weakness. No change in normal functional ability. Denies lower extremity swelling. No redness or skin changes. No radicular pain.   The history is provided by the patient and medical records.       Prior to Admission medications   Medication Sig Start Date End Date Taking? Authorizing Provider  albuterol  (VENTOLIN  HFA) 108 (90 Base) MCG/ACT inhaler Inhale 1-2 puffs into the lungs every 4 (four) hours as needed. 01/07/24   Cari Arlean HERO, FNP  amitriptyline  (ELAVIL ) 25 MG tablet TAKE 1 TABLET BY MOUTH EVERYDAY AT BEDTIME 03/20/24   Avram Lupita BRAVO, MD  amLODipine  (NORVASC ) 2.5 MG tablet Take 1 tablet (2.5 mg total) by mouth daily. 05/15/24 08/13/24  Miriam Norris, NP  apixaban  (ELIQUIS ) 5 MG TABS tablet Take 1 tablet (5 mg total) by mouth 2 (two) times daily. Start taking after completion of starter pack. 11/01/23   Barbarann Dixon B, RPH-CPP  Azelastine  HCl 137 MCG/SPRAY SOLN Place 2 sprays into both nostrils 2 (two) times daily. 11/16/23   [provider]  carvedilol  (COREG ) 25 MG tablet Take 0.5 tablets (12.5 mg total) by mouth 2 (two) times daily with a meal. 05/15/24   Miriam Norris,  NP  clobetasol  ointment (TEMOVATE ) 0.05 % Apply 1 Application topically See admin instructions. Apply to area with itching 3 times a day for a week then twice a day for 2 weeks then daily for 1 week then twice weekly for a week.  To use with only flares 02/17/24   Glennon Norris POUR, MD  conjugated estrogens  (PREMARIN ) vaginal cream Place 1 Applicatorful vaginally 3 (three) times a week. 03/01/24   Glennon Norris POUR, MD  Cyanocobalamin  (VITAMIN B-12 IJ) Inject as directed every 30 (thirty) days.    [provider]  EPINEPHRINE  0.3 mg/0.3 mL IJ SOAJ injection INJECT 0.3 MG INTO THE MUSCLE AS NEEDED FOR ANAPHYLAXIS. 01/12/24   Ambs, Arlean HERO, FNP  fexofenadine (ALLEGRA) 180 MG tablet Take 180 mg by mouth daily. 01/13/24   [provider]  fluticasone  (FLONASE ) 50 MCG/ACT nasal spray Place 2 sprays into both nostrils every morning. 01/07/24   Cari Arlean HERO, FNP  fluticasone -salmeterol (ADVAIR DISKUS) 250-50 MCG/ACT AEPB Inhale 1 puff into the lungs in the morning. 05/05/24   Ambs, Arlean HERO, FNP  furosemide  (LASIX ) 40 MG tablet Take 1 tablet (40 mg total) by mouth daily. 01/17/24   Miriam Norris, NP  gabapentin (NEURONTIN) 100 MG capsule Take 1 tablet by mouth at night as tolerated 02/09/24   [provider]  Galcanezumab -gnlm (EMGALITY ) 120 MG/ML SOAJ Inject 120 mg into the skin every 28 (twenty-eight) days. 05/18/24   Skeet Juliene SAUNDERS, DO  metoprolol   tartrate (LOPRESSOR ) 100 MG tablet Take 1 tablet (100 mg total) by mouth once for 1 dose. Patient not taking: Reported on 07/13/2024 06/20/24 07/10/24  Miriam Norris, NP  nitroGLYCERIN  (NITROSTAT ) 0.4 MG SL tablet Place 1 tablet (0.4 mg total) under the tongue every 5 (five) minutes x 3 doses as needed for chest pain. 05/15/24   Miriam Norris, NP  pantoprazole  (PROTONIX ) 40 MG tablet Take 40 mg by mouth daily.    [provider]  sacubitril -valsartan  (ENTRESTO ) 24-26 MG TAKE 1 TABLET BY MOUTH TWICE A DAY 01/13/24   Alvan Dorn FALCON, MD   sertraline  (ZOLOFT ) 100 MG tablet Take 100 mg by mouth daily. 01/24/20   [provider]  spironolactone  (ALDACTONE ) 25 MG tablet TAKE 1 TABLET (25 MG TOTAL) BY MOUTH DAILY. 01/24/24   Alvan Dorn FALCON, MD  Vitamin D , Ergocalciferol , (DRISDOL) 1.25 MG (50000 UNIT) CAPS capsule Take 50,000 Units by mouth once a week. 10/26/23   [provider]    Allergies: Clarithromycin, Amoxicillin, Meloxicam, Spiriva respimat [tiotropium bromide], and Symbicort  [budesonide -formoterol  fumarate]    Review of Systems  Constitutional:  Negative for chills and fever.  HENT:  Negative for sore throat.   Eyes:  Negative for pain, redness and visual disturbance.  Respiratory:  Negative for cough and shortness of breath.   Cardiovascular:  Negative for chest pain and leg swelling.  Gastrointestinal:  Negative for abdominal pain, nausea and vomiting.  Genitourinary:  Negative for dysuria and flank pain.  Musculoskeletal:  Negative for back pain and neck pain.  Skin:  Negative for rash.  Neurological:  Positive for headaches. Negative for dizziness, syncope, speech difficulty, weakness and numbness.    Updated Vital Signs BP 123/82   Pulse 74   Temp 98 F (36.7 C)   Resp 17   LMP 09/14/2022 (Approximate)   SpO2 96%   Physical Exam Vitals and nursing note reviewed.  Constitutional:      Appearance: Normal appearance. She is well-developed.  HENT:     Head: Atraumatic.     Comments: No sinus, temporal or mastoid tenderness.     Nose: Nose normal.     Mouth/Throat:     Mouth: Mucous membranes are moist.  Eyes:     General: No scleral icterus.    Extraocular Movements: Extraocular movements intact.     Conjunctiva/sclera: Conjunctivae normal.     Pupils: Pupils are equal, round, and reactive to light.  Neck:     Vascular: No carotid bruit.     Trachea: No tracheal deviation.     Comments: Trachea midline, thyroid  not grossly enlarged or tender. No neck stiffness or rigidity.   Cardiovascular:     Rate and Rhythm: Normal rate and regular rhythm.     Pulses: Normal pulses.     Heart sounds: Normal heart sounds. No murmur heard.    No friction rub. No gallop.  Pulmonary:     Effort: Pulmonary effort is normal. No respiratory distress.     Breath sounds: Normal breath sounds.  Abdominal:     General: Bowel sounds are normal. There is no distension.     Palpations: Abdomen is soft.     Tenderness: There is no abdominal tenderness. There is no guarding.  Genitourinary:    Comments: No cva tenderness.  Musculoskeletal:        General: No swelling or tenderness.     Cervical back: Normal range of motion and neck supple. No rigidity. No muscular tenderness.  Right lower leg: No edema.     Left lower leg: No edema.     Comments: Good passive rom bilateral lower extremity without pain. Bilateral lower extremities are of normal color and warmth with intact distal pulses. No erythema or skin lesions. No focal bony tenderness. No mass felt   Skin:    General: Skin is warm and dry.     Findings: No rash.  Neurological:     Mental Status: She is alert.     Comments: Alert, speech normal. Motor/sens grossly intact bil. Stre 5/5. Sens intact. Steady gait.   Psychiatric:        Mood and Affect: Mood normal.     (all labs ordered are listed, but only abnormal results are displayed) Results for orders placed or performed during the hospital encounter of 07/19/24  CBC   Collection Time: 07/19/24  8:20 PM  Result Value Ref Range   WBC 5.2 4.0 - 10.5 K/uL   RBC 4.52 3.87 - 5.11 MIL/uL   Hemoglobin 12.0 12.0 - 15.0 g/dL   HCT 63.3 63.9 - 53.9 %   MCV 81.0 80.0 - 100.0 fL   MCH 26.5 26.0 - 34.0 pg   MCHC 32.8 30.0 - 36.0 g/dL   RDW 85.3 88.4 - 84.4 %   Platelets 214 150 - 400 K/uL   nRBC 0.0 0.0 - 0.2 %  Comprehensive metabolic panel with GFR   Collection Time: 07/19/24  8:20 PM  Result Value Ref Range   Sodium 141 135 - 145 mmol/L   Potassium 4.0 3.5 - 5.1  mmol/L   Chloride 104 98 - 111 mmol/L   CO2 27 22 - 32 mmol/L   Glucose, Bld 81 70 - 99 mg/dL   BUN 10 6 - 20 mg/dL   Creatinine, Ser 9.08 0.44 - 1.00 mg/dL   Calcium 9.7 8.9 - 89.6 mg/dL   Total Protein 7.4 6.5 - 8.1 g/dL   Albumin 4.3 3.5 - 5.0 g/dL   AST 25 15 - 41 U/L   ALT 23 0 - 44 U/L   Alkaline Phosphatase 95 38 - 126 U/L   Total Bilirubin 0.4 0.0 - 1.2 mg/dL   GFR, Estimated >39 >39 mL/min   Anion gap 9 5 - 15   CT Head Wo Contrast Result Date: 07/19/2024 EXAM: CT HEAD WITHOUT CONTRAST 07/19/2024 10:44:23 PM TECHNIQUE: CT of the head was performed without the administration of intravenous contrast. Automated exposure control, iterative reconstruction, and/or weight based adjustment of the mA/kV was utilized to reduce the radiation dose to as low as reasonably achievable. COMPARISON: None available. CLINICAL HISTORY: Headache, new onset (Age >= 51y) Headache, new onset (Age >= 51y) FINDINGS: BRAIN AND VENTRICLES: No acute hemorrhage. No evidence of acute infarct. No hydrocephalus. No extra-axial collection. No mass effect or midline shift. ORBITS: No acute abnormality. SINUSES: No acute abnormality. SOFT TISSUES AND SKULL: No acute soft tissue abnormality. No skull fracture. IMPRESSION: 1. No acute intracranial abnormality. Electronically signed by: Pinkie Pebbles MD 07/19/2024 10:46 PM EST RP Workstation: [id number]       Procedures   Medications Ordered in the ED  oxyCODONE-acetaminophen  (PERCOCET/ROXICET) 5-325 MG per tablet 1 tablet (1 tablet Oral Given 07/19/24 2224)  ondansetron  (ZOFRAN -ODT) disintegrating tablet 8 mg (8 mg Oral Given 07/19/24 2225)  Medical Decision Making Problems Addressed: Bilateral lower extremity pain: chronic illness or injury with exacerbation, progression, or side effects of treatment Frontal headache: acute illness or injury with systemic symptoms History of sickle cell trait: chronic illness or  injury  Amount and/or Complexity of Data Reviewed External Data Reviewed: labs, radiology and notes. Labs: ordered. Decision-making details documented in ED Course. Radiology: ordered and independent interpretation performed. Decision-making details documented in ED Course.  Risk OTC drugs. Prescription drug management. Decision regarding hospitalization.    Labs ordered/sent. Imaging ordered.   Differential diagnosis includes migraine, myalgia, arthralgia, sdh, mass, etc. Dispo decision including potential need for admission considered - will get labs and imaging and reassess.   Reviewed nursing notes and prior charts for additional history. External reports reviewed.   Percocet po, zofran  po. Po fluids.   Labs reviewed/interpreted by me - wbc and hgb normal. Chem normal.   CT reviewed/interpreted by me - no hem.   Pt appears comfortable, no distress. Pt currently appears stable for ED d/c.   Rec close pcp f/u.  Return precautions provided.       Final diagnoses:  None    ED Discharge Orders     None          Bernard Drivers, MD 07/19/24 2327

## 2024-07-19 NOTE — Progress Notes (Signed)
 Patient ID: Mary Pratt, female   DOB: 1971-06-13, 53 y.o.   MRN: [id number]  Reason for Consult: New Patient (Initial Visit)   Referred by Toribio Jerel MATSU, MD  Subjective:     HPI:  Mary Pratt is a 53 y.o. female with history of congestive heart failure and sickle cell trait.  She has had pain in her left greater than right lower extremity beginning in January of this year and was diagnosed with DVT in the left lower extremity.  No previous history of DVT.  She remains on Eliquis .  No family history of DVT.  She denies history of stroke, TIA or amaurosis and has no personal or family history of aneurysm disease.  She occasionally wears compression stockings and states that these do help alleviate some of her pain in her legs.  She has associated pain in her head and scalp as well as in her bilateral upper extremities.  Pain is at rest and with activity does not have frank claudication.  She denies classic rest pain or tissue loss.  Past Medical History:  Diagnosis Date   Abnormal pap 05/2012   ASCUS + HPV, 11/14 LGSIL   Allergy     Anemia    Anxiety    Asthma    Cardiac defibrillator in place    Chronic systolic CHF (congestive heart failure) (HCC)    a. 02/2015 Echo: EF 20%, sev dil w/ diff HK, worse @ inf base. Tiv AI, mild MR, mod dil LA, PASP .   Clotting disorder    DVT of lower extremity (deep venous thrombosis) (HCC)    Functional dyspepsia    early satiety    GERD with stricture    dilated 2010   HELICOBACTER PYLORI GASTRITIS 01/15/2009   dyspnea, ? reaction to Biaxin/amoxicillin  Pylera 01/15/09 - completed therapy      IBS (irritable bowel syndrome)    Iron deficiency    NICM (nonischemic cardiomyopathy) (HCC)    a. 02/2015 Cath: nl cors, EF 15%, mild PAH;  b. 02/2015 Enrolled in VEST trial.   Sickle cell trait    Situational depression    son died   Vitamin B 12 deficiency 11/2022   Vitamin D  deficiency    Family History  Problem Relation Age of  Onset   Diabetes Mother    Hypertension Mother    Hypothyroidism Mother    Prostate cancer Father 58   Breast cancer Maternal Aunt 42   Breast cancer Maternal Aunt 58   Breast cancer Maternal Aunt 48   Brain cancer Maternal Uncle 68   Brain cancer Maternal Uncle 2   Asthma Paternal Aunt    Prostate cancer Paternal Uncle 100   Prostate cancer Paternal Uncle 27   Prostate cancer Paternal Uncle 65   Lung cancer Maternal Grandmother 33   Eczema Daughter    Allergic rhinitis Daughter    Colon cancer Neg Hx    Colon polyps Neg Hx    Esophageal cancer Neg Hx    Past Surgical History:  Procedure Laterality Date   BIV ICD GENERATOR CHANGEOUT N/A 01/24/2021   Procedure: BIV ICD GENERATOR CHANGEOUT;  Surgeon: Fernande Elspeth BROCKS, MD;  Location: [facility] INVASIVE CV LAB;  Service: Cardiovascular;  Laterality: N/A;   CARDIAC CATHETERIZATION N/A 02/26/2015   Procedure: Right/Left Heart Cath and Coronary Angiography;  Surgeon: Debby DELENA Sor, MD;  Location: [facility] INVASIVE CV LAB;  Service: Cardiovascular;  Laterality: N/A;   COLPOSCOPY  09/08/2011   neg  EP IMPLANTABLE DEVICE N/A 10/14/2015   Procedure: BiV ICD Insertion CRT-D;  Surgeon: Elspeth JAYSON Sage, MD;  Location: [facility] INVASIVE CV LAB;  Service: Cardiovascular;  Laterality: N/A;   ESOPHAGOGASTRODUODENOSCOPY (EGD) WITH ESOPHAGEAL DILATION  11/30/2008   esophageal ring dilated 54 French, H. pylori gastritis   HARVEST BONE GRAFT  02/2023   TUBAL LIGATION  09/07/1989   WISDOM TOOTH EXTRACTION     WRIST SURGERY  2023    Short Social History:  Social History   Tobacco Use   Smoking status: Never    Passive exposure: Past   Smokeless tobacco: Never  Substance Use Topics   Alcohol use: Not Currently    Allergies  Allergen Reactions   Clarithromycin Hives   Amoxicillin Palpitations    REACTION: Tacycardia   Meloxicam Rash   Spiriva Respimat [Tiotropium Bromide] Rash   Symbicort  [Budesonide -Formoterol  Fumarate] Rash    Current Outpatient  Medications  Medication Sig Dispense Refill   albuterol  (VENTOLIN  HFA) 108 (90 Base) MCG/ACT inhaler Inhale 1-2 puffs into the lungs every 4 (four) hours as needed. 18 g 1   amitriptyline  (ELAVIL ) 25 MG tablet TAKE 1 TABLET BY MOUTH EVERYDAY AT BEDTIME 90 tablet 2   amLODipine  (NORVASC ) 2.5 MG tablet Take 1 tablet (2.5 mg total) by mouth daily. 90 tablet 0   apixaban  (ELIQUIS ) 5 MG TABS tablet Take 1 tablet (5 mg total) by mouth 2 (two) times daily. Start taking after completion of starter pack. 60 tablet 4   Azelastine  HCl 137 MCG/SPRAY SOLN Place 2 sprays into both nostrils 2 (two) times daily.     carvedilol  (COREG ) 25 MG tablet Take 0.5 tablets (12.5 mg total) by mouth 2 (two) times daily with a meal. 90 tablet 1   clobetasol  ointment (TEMOVATE ) 0.05 % Apply 1 Application topically See admin instructions. Apply to area with itching 3 times a day for a week then twice a day for 2 weeks then daily for 1 week then twice weekly for a week.  To use with only flares 30 g 1   conjugated estrogens  (PREMARIN ) vaginal cream Place 1 Applicatorful vaginally 3 (three) times a week. 42.5 g 0   Cyanocobalamin  (VITAMIN B-12 IJ) Inject as directed every 30 (thirty) days.     EPINEPHRINE  0.3 mg/0.3 mL IJ SOAJ injection INJECT 0.3 MG INTO THE MUSCLE AS NEEDED FOR ANAPHYLAXIS. 2 each 1   fexofenadine (ALLEGRA) 180 MG tablet Take 180 mg by mouth daily.     fluticasone  (FLONASE ) 50 MCG/ACT nasal spray Place 2 sprays into both nostrils every morning. 48 g 1   fluticasone -salmeterol (ADVAIR DISKUS) 250-50 MCG/ACT AEPB Inhale 1 puff into the lungs in the morning. 60 each 2   furosemide  (LASIX ) 40 MG tablet Take 1 tablet (40 mg total) by mouth daily. 30 tablet 5   gabapentin (NEURONTIN) 100 MG capsule Take 1 tablet by mouth at night as tolerated     Galcanezumab -gnlm (EMGALITY ) 120 MG/ML SOAJ Inject 120 mg into the skin every 28 (twenty-eight) days. 1 mL 5   metoprolol  tartrate (LOPRESSOR ) 100 MG tablet Take 1 tablet  (100 mg total) by mouth once for 1 dose. (Patient not taking: Reported on 07/13/2024) 1 tablet 0   nitroGLYCERIN  (NITROSTAT ) 0.4 MG SL tablet Place 1 tablet (0.4 mg total) under the tongue every 5 (five) minutes x 3 doses as needed for chest pain. 25 tablet 12   pantoprazole  (PROTONIX ) 40 MG tablet Take 40 mg by mouth daily.     sacubitril -valsartan  (ENTRESTO )  24-26 MG TAKE 1 TABLET BY MOUTH TWICE A DAY 60 tablet 6   sertraline  (ZOLOFT ) 100 MG tablet Take 100 mg by mouth daily.     spironolactone  (ALDACTONE ) 25 MG tablet TAKE 1 TABLET (25 MG TOTAL) BY MOUTH DAILY. 90 tablet 1   Vitamin D , Ergocalciferol , (DRISDOL) 1.25 MG (50000 UNIT) CAPS capsule Take 50,000 Units by mouth once a week.     No current facility-administered medications for this visit.    Review of Systems  Constitutional: Positive for fatigue.  HENT: HENT negative.  Eyes: Eyes negative.  Musculoskeletal: Positive for leg pain.       Bilateral upper extremity pain Neurological: Positive for headaches.  Hematologic: Hematologic/lymphatic negative.  Psychiatric: Psychiatric negative.        Objective:  Objective   Vitals:   07/19/24 1501  BP: 121/86  Pulse: 72  Temp: 98 F (36.7 C)  SpO2: 96%  Weight: 163 lb (73.9 kg)  Height: 5' 1 (1.549 m)   Body mass index is 30.8 kg/m.  Physical Exam HENT:     Head: Normocephalic.     Nose: Nose normal.  Eyes:     Pupils: Pupils are equal, round, and reactive to light.  Cardiovascular:     Pulses: Normal pulses.  Pulmonary:     Effort: Pulmonary effort is normal.  Abdominal:     General: Abdomen is flat.     Palpations: Abdomen is soft. There is no mass.  Musculoskeletal:     Cervical back: Normal range of motion and neck supple.  Skin:    General: Skin is warm and dry.     Capillary Refill: Capillary refill takes less than 2 seconds.  Neurological:     General: No focal deficit present.     Mental Status: She is alert.  Psychiatric:        Mood and  Affect: Mood normal.     Data: ABI Findings:  +---------+------------------+-----+---------+--------+  Right   Rt Pressure (mmHg)IndexWaveform Comment   +---------+------------------+-----+---------+--------+  Brachial 125                                       +---------+------------------+-----+---------+--------+  PTA     147               1.17 triphasic          +---------+------------------+-----+---------+--------+  DP      148               1.17 triphasic          +---------+------------------+-----+---------+--------+  Great Toe131               1.04 Normal             +---------+------------------+-----+---------+--------+   +---------+------------------+-----+---------+-------+  Left    Lt Pressure (mmHg)IndexWaveform Comment  +---------+------------------+-----+---------+-------+  Brachial 126                                      +---------+------------------+-----+---------+-------+  PTA     148               1.17 triphasic         +---------+------------------+-----+---------+-------+  DP      147               1.17 triphasic         +---------+------------------+-----+---------+-------+  Great Toe161               1.28 Normal            +---------+------------------+-----+---------+-------+   +-------+-----------+-----------+------------+------------+  ABI/TBIToday's ABIToday's TBIPrevious ABIPrevious TBI  +-------+-----------+-----------+------------+------------+  Right 1.17       1.04       1.14        .84           +-------+-----------+-----------+------------+------------+  Left  1.17       1.28       1.10        .98           +-------+-----------+-----------+------------+------------+         Bilateral ABIs and TBIs appear essentially unchanged compared to prior  study on 05/204.    Summary:  Right: Resting right ankle-brachial index is within normal range. The  right  toe-brachial index is normal.    Left: Resting left ankle-brachial index is within normal range. The left  toe-brachial index is normal.         Assessment/Plan:    53 year old female with pain as listed above.  Arterial tree it appears intact and does not appear to have venous disease.  She does wear compression stockings she can continue these and follow-up with me on an as-needed basis.     Penne Lonni Colorado MD [facility]

## 2024-07-21 ENCOUNTER — Ambulatory Visit (INDEPENDENT_AMBULATORY_CARE_PROVIDER_SITE_OTHER): Payer: BC Managed Care – PPO

## 2024-07-21 ENCOUNTER — Telehealth: Payer: Self-pay | Admitting: Pharmacy Technician

## 2024-07-21 ENCOUNTER — Other Ambulatory Visit (HOSPITAL_COMMUNITY): Payer: Self-pay

## 2024-07-21 DIAGNOSIS — I5020 Unspecified systolic (congestive) heart failure: Secondary | ICD-10-CM

## 2024-07-21 NOTE — Telephone Encounter (Signed)
 Pharmacy Patient Advocate Encounter  Received notification from [employer] that Prior Authorization for EMGALITY  120MG  has been APPROVED from 11.14.25 to 11.14.26. Ran test claim, Copay is $4. This test claim was processed through [facility]- copay amounts may vary at other pharmacies due to pharmacy/plan contracts, or as the patient moves through the different stages of their insurance plan.   PA #/Case ID/Reference #: [id number]

## 2024-07-21 NOTE — Telephone Encounter (Signed)
 Pharmacy Patient Advocate Encounter   Received notification from CoverMyMeds that prior authorization for EMGALITY  120MG  is required/requested.   Insurance verification completed.   The patient is insured through [employer].   Per test claim: PA required; PA submitted to above mentioned insurance via Latent Key/confirmation #/EOC [id number] Status is pending

## 2024-07-23 LAB — CUP PACEART REMOTE DEVICE CHECK
Battery Remaining Longevity: 19 mo
Battery Voltage: 2.89 V
Brady Statistic AP VP Percent: 0.08 %
Brady Statistic AP VS Percent: 0.01 %
Brady Statistic AS VP Percent: 99.87 %
Brady Statistic AS VS Percent: 0.03 %
Brady Statistic RA Percent Paced: 0.1 %
Brady Statistic RV Percent Paced: 98.51 %
Date Time Interrogation Session: 20251114023325
HighPow Impedance: 67 Ohm
Implantable Lead Connection Status: 753985
Implantable Lead Connection Status: 753985
Implantable Lead Connection Status: 753985
Implantable Lead Implant Date: 20170206
Implantable Lead Implant Date: 20170206
Implantable Lead Implant Date: 20170206
Implantable Lead Location: 753858
Implantable Lead Location: 753859
Implantable Lead Location: 753860
Implantable Lead Model: 4398
Implantable Lead Model: 5076
Implantable Pulse Generator Implant Date: 20220520
Lead Channel Impedance Value: 1140 Ohm
Lead Channel Impedance Value: 1140 Ohm
Lead Channel Impedance Value: 1235 Ohm
Lead Channel Impedance Value: 1368 Ohm
Lead Channel Impedance Value: 1406 Ohm
Lead Channel Impedance Value: 292.308
Lead Channel Impedance Value: 292.308
Lead Channel Impedance Value: 300.368
Lead Channel Impedance Value: 393.735
Lead Channel Impedance Value: 393.735
Lead Channel Impedance Value: 456 Ohm
Lead Channel Impedance Value: 475 Ohm
Lead Channel Impedance Value: 589 Ohm
Lead Channel Impedance Value: 646 Ohm
Lead Channel Impedance Value: 760 Ohm
Lead Channel Impedance Value: 760 Ohm
Lead Channel Impedance Value: 817 Ohm
Lead Channel Impedance Value: 988 Ohm
Lead Channel Pacing Threshold Amplitude: 0.5 V
Lead Channel Pacing Threshold Amplitude: 0.5 V
Lead Channel Pacing Threshold Amplitude: 4 V
Lead Channel Pacing Threshold Pulse Width: 0.4 ms
Lead Channel Pacing Threshold Pulse Width: 0.4 ms
Lead Channel Pacing Threshold Pulse Width: 1 ms
Lead Channel Sensing Intrinsic Amplitude: 1.25 mV
Lead Channel Sensing Intrinsic Amplitude: 1.25 mV
Lead Channel Sensing Intrinsic Amplitude: 10.625 mV
Lead Channel Sensing Intrinsic Amplitude: 10.625 mV
Lead Channel Setting Pacing Amplitude: 1.5 V
Lead Channel Setting Pacing Amplitude: 2 V
Lead Channel Setting Pacing Amplitude: 3.75 V
Lead Channel Setting Pacing Pulse Width: 0.4 ms
Lead Channel Setting Pacing Pulse Width: 1 ms
Lead Channel Setting Sensing Sensitivity: 0.3 mV
Zone Setting Status: 755011

## 2024-07-25 ENCOUNTER — Encounter: Payer: Self-pay | Admitting: Internal Medicine

## 2024-07-25 NOTE — Telephone Encounter (Signed)
 Pts second time calling for results and follow up plan.

## 2024-07-25 NOTE — Progress Notes (Signed)
 Remote ICD Transmission

## 2024-07-26 ENCOUNTER — Telehealth: Payer: Self-pay | Admitting: Internal Medicine

## 2024-07-26 NOTE — Telephone Encounter (Signed)
 Pt returned call. She reports that she is having abd pain near her umbilicus. Also reports nausea, vomiting, bloating, and constipation. Her last BM was Sunday and it was hard to pass. Pt went to [facility] ER yesterday where CT revealed Mild mesenteric panniculitis. Pt was instructed to f/u with GI. 1st available appt scheduled for 08/11/24 at 1:30.

## 2024-07-26 NOTE — Telephone Encounter (Signed)
 Inbound call from patient returning a call to [place]. Patient is requesting a call back. Please advise.

## 2024-07-26 NOTE — Telephone Encounter (Signed)
 Inbound call from patient stating she's been experiencing abdominal pain and change in her bowel movements and would like to know if theres a sooner availability for her to be seen since her appointment is on 08/25/24.  Please advise  Thank you

## 2024-07-26 NOTE — Telephone Encounter (Signed)
 Spoke with pt. 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 She states, I need treatment for that. I know someone can treat, and I want a referral to who ever it is. I advised pt of recommendations to try OTC NSAIDs. Pt became upset stating that she needs more treatment than that.   I attempted to discuss what pt has tried for her bms. She verbalized that she has not tried anything. I can't eat much and I can't drink much. I ain't took nothing for it.

## 2024-07-26 NOTE — Telephone Encounter (Signed)
 Attempted to reach patient. No answer, left VM for patient to return call.

## 2024-07-26 NOTE — Telephone Encounter (Signed)
 Attempted to reach pt to discuss. No answer, lvm for pt to return call.

## 2024-07-27 NOTE — Telephone Encounter (Signed)
 Spoke with pt. Discussed that the main tx is observation and NSAIDs and that the provider is willing to trial a Prednisone  taper. Pt states, No I don't think I want to do that, just tell her to give me my referral. Advised pt that Harlene is not recommending a referral at this time. Pt states, well I'll just get my own referral then. Offered again to send in Prednisone , pt again refused.

## 2024-07-28 ENCOUNTER — Encounter

## 2024-07-29 ENCOUNTER — Other Ambulatory Visit: Payer: Self-pay

## 2024-07-29 ENCOUNTER — Encounter (HOSPITAL_COMMUNITY): Payer: Self-pay

## 2024-07-29 ENCOUNTER — Emergency Department (HOSPITAL_COMMUNITY)

## 2024-07-29 ENCOUNTER — Emergency Department (HOSPITAL_COMMUNITY)
Admission: EM | Admit: 2024-07-29 | Discharge: 2024-07-29 | Disposition: A | Discharge status: Home / Self Care | Attending: Emergency Medicine | Admitting: Emergency Medicine

## 2024-07-29 DIAGNOSIS — I5022 Chronic systolic (congestive) heart failure: Secondary | ICD-10-CM | POA: Insufficient documentation

## 2024-07-29 DIAGNOSIS — J45909 Unspecified asthma, uncomplicated: Secondary | ICD-10-CM | POA: Diagnosis not present

## 2024-07-29 DIAGNOSIS — M545 Low back pain, unspecified: Secondary | ICD-10-CM | POA: Diagnosis present

## 2024-07-29 LAB — COMPREHENSIVE METABOLIC PANEL WITH GFR
ALT: 22 U/L (ref 0–44)
AST: 24 U/L (ref 15–41)
Albumin: 4.6 g/dL (ref 3.5–5.0)
Alkaline Phosphatase: 94 U/L (ref 38–126)
Anion gap: 11 (ref 5–15)
BUN: 12 mg/dL (ref 6–20)
CO2: 29 mmol/L (ref 22–32)
Calcium: 9.8 mg/dL (ref 8.9–10.3)
Chloride: 100 mmol/L (ref 98–111)
Creatinine, Ser: 0.97 mg/dL (ref 0.44–1.00)
GFR, Estimated: >60 mL/min (ref >60)
Glucose, Bld: 86 mg/dL (ref 70–99)
Potassium: 3.4 mmol/L — ABNORMAL LOW (ref 3.5–5.1)
Sodium: 139 mmol/L (ref 135–145)
Total Bilirubin: 0.4 mg/dL (ref 0.0–1.2)
Total Protein: 7.7 g/dL (ref 6.5–8.1)

## 2024-07-29 LAB — CBC
HCT: 35.9 % — ABNORMAL LOW (ref 36.0–46.0)
Hemoglobin: 11.9 g/dL — ABNORMAL LOW (ref 12.0–15.0)
MCH: 26.5 pg (ref 26.0–34.0)
MCHC: 33.1 g/dL (ref 30.0–36.0)
MCV: 80 fL (ref 80.0–100.0)
Platelets: 201 K/uL (ref 150–400)
RBC: 4.49 MIL/uL (ref 3.87–5.11)
RDW: 14.5 % (ref 11.5–15.5)
WBC: 6.4 K/uL (ref 4.0–10.5)
nRBC: 0 % (ref 0.0–0.2)

## 2024-07-29 LAB — LIPASE, BLOOD: Lipase: 23 U/L (ref 11–51)

## 2024-07-29 LAB — URINALYSIS, ROUTINE W REFLEX MICROSCOPIC
Bilirubin Urine: NEGATIVE
Glucose, UA: NEGATIVE mg/dL
Hgb urine dipstick: NEGATIVE
Ketones, ur: NEGATIVE mg/dL
Leukocytes,Ua: NEGATIVE
Nitrite: NEGATIVE
Protein, ur: NEGATIVE mg/dL
Specific Gravity, Urine: 1.009 (ref 1.005–1.030)
pH: 6 (ref 5.0–8.0)

## 2024-07-29 MED ORDER — METHOCARBAMOL 500 MG PO TABS: 500.0000 mg | ORAL_TABLET | Freq: Three times a day (TID) | ORAL | 0 refills | Status: AC | PRN

## 2024-07-29 MED ORDER — MORPHINE SULFATE (PF) 4 MG/ML IV SOLN
4.0000 mg | Freq: Once | INTRAVENOUS | Status: AC
  Administered 2024-07-29: 4 mg via INTRAVENOUS
  Filled 2024-07-29: qty 1

## 2024-07-29 MED ORDER — LIDOCAINE 5 % EX PTCH: 1.0000 | MEDICATED_PATCH | CUTANEOUS | 0 refills | Status: AC

## 2024-07-29 MED ORDER — ONDANSETRON HCL 4 MG/2ML IJ SOLN
4.0000 mg | Freq: Once | INTRAMUSCULAR | Status: AC
  Administered 2024-07-29: 4 mg via INTRAVENOUS
  Filled 2024-07-29: qty 2

## 2024-07-29 NOTE — ED Provider Notes (Signed)
 [facility]-EMERGENCY DEPT [facility] Emergency Department Provider Note MRN:  [id number]  Arrival date & time: 07/29/24     Chief Complaint   Back Pain   History of Present Illness   Mary Pratt is a 53 y.o. year-old female with a history of degenerative disc disease, CHF presenting to the ED with chief complaint of back pain.  Diffuse low back pain for the past few days.  Denies numbness or weakness to the arms or legs, no bowel or bladder dysfunction.  Sometimes it burns to be, sometimes the pain wraps around to the flanks and abdomen.  Review of Systems  A thorough review of systems was obtained and all systems are negative except as noted in the HPI and PMH.   Patient's Health History    Past Medical History:  Diagnosis Date   Abnormal pap 05/2012   ASCUS + HPV, 11/14 LGSIL   Allergy     Anemia    Anxiety    Asthma    Cardiac defibrillator in place    Chronic systolic CHF (congestive heart failure) (HCC)    a. 02/2015 Echo: EF 20%, sev dil w/ diff HK, worse @ inf base. Tiv AI, mild MR, mod dil LA, PASP .   Clotting disorder    DVT of lower extremity (deep venous thrombosis) (HCC)    Functional dyspepsia    early satiety    GERD with stricture    dilated 2010   HELICOBACTER PYLORI GASTRITIS 01/15/2009   dyspnea, ? reaction to Biaxin/amoxicillin  Pylera 01/15/09 - completed therapy      IBS (irritable bowel syndrome)    Iron deficiency    NICM (nonischemic cardiomyopathy) (HCC)    a. 02/2015 Cath: nl cors, EF 15%, mild PAH;  b. 02/2015 Enrolled in VEST trial.   Sickle cell trait    Situational depression    son died   Vitamin B 12 deficiency 11/2022   Vitamin D  deficiency     Past Surgical History:  Procedure Laterality Date   BIV ICD GENERATOR CHANGEOUT N/A 01/24/2021   Procedure: BIV ICD GENERATOR CHANGEOUT;  Surgeon: Fernande Elspeth BROCKS, MD;  Location: [facility] INVASIVE CV LAB;  Service: Cardiovascular;  Laterality: N/A;   CARDIAC CATHETERIZATION N/A 02/26/2015    Procedure: Right/Left Heart Cath and Coronary Angiography;  Surgeon: Debby DELENA Sor, MD;  Location: [facility] INVASIVE CV LAB;  Service: Cardiovascular;  Laterality: N/A;   COLPOSCOPY  09/08/2011   neg   EP IMPLANTABLE DEVICE N/A 10/14/2015   Procedure: BiV ICD Insertion CRT-D;  Surgeon: Elspeth BROCKS Fernande, MD;  Location: [facility] INVASIVE CV LAB;  Service: Cardiovascular;  Laterality: N/A;   ESOPHAGOGASTRODUODENOSCOPY (EGD) WITH ESOPHAGEAL DILATION  11/30/2008   esophageal ring dilated 54 French, H. pylori gastritis   HARVEST BONE GRAFT  02/2023   TUBAL LIGATION  09/07/1989   WISDOM TOOTH EXTRACTION     WRIST SURGERY  2023    Family History  Problem Relation Age of Onset   Diabetes Mother    Hypertension Mother    Hypothyroidism Mother    Prostate cancer Father 26   Breast cancer Maternal Aunt 76   Breast cancer Maternal Aunt 43   Breast cancer Maternal Aunt 71   Brain cancer Maternal Uncle 68   Brain cancer Maternal Uncle 77   Asthma Paternal Aunt    Prostate cancer Paternal Uncle 32   Prostate cancer Paternal Uncle 59   Prostate cancer Paternal Uncle 16   Lung cancer Maternal Grandmother 48  Eczema Daughter    Allergic rhinitis Daughter    Colon cancer Neg Hx    Colon polyps Neg Hx    Esophageal cancer Neg Hx     Social History   Socioeconomic History   Marital status: Single    Spouse name: Not on file   Number of children: 3   Years of education: Not on file   Highest education level: Not on file  Occupational History   Occupation: texturing operator    Employer: [employer]  Tobacco Use   Smoking status: Never    Passive exposure: Past   Smokeless tobacco: Never  Vaping Use   Vaping status: Never Used  Substance and Sexual Activity   Alcohol use: Not Currently   Drug use: No   Sexual activity: Yes    Partners: Male    Birth control/protection: Surgical, Post-menopausal    Comment: BTL  Other Topics Concern   Not on file  Social History Narrative   Single, 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   2 sons 1 daughter 1 son deceased   Drinks caffeine  up to a few a day, never smoker no drug use   Occ wine   Social Drivers of Corporate Investment Banker Strain: Not on file  Food Insecurity: No Food Insecurity (10/15/2023)   Hunger Vital Sign    Worried About Running Out of Food in the Last Year: Never true    Ran Out of Food in the Last Year: Never true  Transportation Needs: No Transportation Needs (10/15/2023)   PRAPARE - Administrator, Civil Service (Medical): No    Lack of Transportation (Non-Medical): No  Physical Activity: Not on file  Stress: Not on file  Social Connections: Not on file  Intimate Partner Violence: Not At Risk (10/15/2023)   Humiliation, Afraid, Rape, and Kick questionnaire    Fear of Current or Ex-Partner: No    Emotionally Abused: No    Physically Abused: No    Sexually Abused: No     Physical Exam   Vitals:   07/29/24 0730 07/29/24 0745  BP: (!) 114/91 (!) 128/94  Pulse: 66 61  Resp:    Temp:    SpO2: 93% 94%    CONSTITUTIONAL: Well-appearing, NAD NEURO/PSYCH:  Alert and oriented x 3, no focal deficits EYES:  eyes equal and reactive ENT/NECK:  no LAD, no JVD CARDIO: Regular rate, well-perfused, normal S1 and S2 PULM:  CTAB no wheezing or rhonchi GI/GU:  non-distended, non-tender MSK/SPINE:  No gross deformities, no edema SKIN:  no rash, atraumatic   *Additional and/or pertinent findings included in MDM below  Diagnostic and Interventional Summary    EKG Interpretation Date/Time:    Ventricular Rate:    PR Interval:    QRS Duration:    QT Interval:    QTC Calculation:   R Axis:      Text Interpretation:         Labs Reviewed  CBC - Abnormal; Notable for the following components:      Result Value   Hemoglobin 11.9 (*)    HCT 35.9 (*)    All other components within normal limits  COMPREHENSIVE METABOLIC PANEL WITH GFR - Abnormal; Notable for the following  components:   Potassium 3.4 (*)    All other components within normal limits  URINALYSIS, ROUTINE W REFLEX MICROSCOPIC - Abnormal; Notable for the following components:   Color, Urine STRAW (*)    All other  components within normal limits  LIPASE, BLOOD    CT RENAL STONE STUDY  Final Result      Medications  morphine  (PF) 4 MG/ML injection 4 mg (4 mg Intravenous Given 07/29/24 0603)  ondansetron  (ZOFRAN ) injection 4 mg (4 mg Intravenous Given 07/29/24 0600)     Procedures  /  Critical Care Procedures  ED Course and Medical Decision Making  Initial Impression and Ddx Differential diagnosis includes MSK pain, pyelonephritis, kidney stone.  Past medical/surgical history that increases complexity of ED encounter: Degenerative disc disease  Interpretation of Diagnostics I personally reviewed the Laboratory Testing and my interpretation is as follows: No significant blood count or electrolyte disturbance.  Urinalysis unremarkable.  CT imaging without renal pathology.  Evidence of worsening degenerative disc disease.  Patient Reassessment and Ultimate Disposition/Management     Patient continues to have a reassuring exam with no signs or symptoms of myelopathy.  Appropriate for discharge.  Patient management required discussion with the following services or consulting groups:  None  Complexity of Problems Addressed Acute illness or injury that poses threat of life of bodily function  Additional Data Reviewed and Analyzed Further history obtained from: None  Additional Factors Impacting ED Encounter Risk Consideration of hospitalization  Ozell HERO. Theadore, MD [facility] Emergency Medicine [facility] [email or web address]  Final Clinical Impressions(s) / ED Diagnoses     ICD-10-CM   1. Acute bilateral low back pain without sciatica  M54.50       ED Discharge Orders          Ordered    lidocaine  (LIDODERM ) 5 %  Every 24 hours        07/29/24 0801     methocarbamol  (ROBAXIN ) 500 MG tablet  Every 8 hours PRN        07/29/24 0801             Discharge Instructions Discussed with and Provided to Patient:    Discharge Instructions      You were evaluated in the Emergency Department and after careful evaluation, we did not find any emergent condition requiring admission or further testing in the hospital.  Your exam/testing today is overall reassuring.  Symptoms likely due to worsening of your degenerative disc disease.  Use Tylenol  or ibuprofen at home for pain.  Can use the Robaxin  muscle relaxer for more significant pain.  Use caution as this medication can cause drowsiness.  Can also use the numbing patches provided.  Follow-up with the spine experts.  Please return to the Emergency Department if you experience any worsening of your condition.   Thank you for allowing us  to be a part of your care.      Theadore Ozell HERO, MD 07/29/24 [phone]

## 2024-07-29 NOTE — ED Triage Notes (Signed)
 POV from home. Cc of lower back pain for 3 days. Worse today. Hurts worse with movement but thinks its her kidneys. 6/10 sharp pain. Did not take anything for it.

## 2024-07-29 NOTE — Discharge Instructions (Signed)
 You were evaluated in the Emergency Department and after careful evaluation, we did not find any emergent condition requiring admission or further testing in the hospital.  Your exam/testing today is overall reassuring.  Symptoms likely due to worsening of your degenerative disc disease.  Use Tylenol  or ibuprofen at home for pain.  Can use the Robaxin  muscle relaxer for more significant pain.  Use caution as this medication can cause drowsiness.  Can also use the numbing patches provided.  Follow-up with the spine experts.  Please return to the Emergency Department if you experience any worsening of your condition.   Thank you for allowing us  to be a part of your care.

## 2024-08-02 ENCOUNTER — Encounter: Admitting: Vascular Surgery

## 2024-08-02 ENCOUNTER — Ambulatory Visit (HOSPITAL_COMMUNITY)

## 2024-08-07 ENCOUNTER — Ambulatory Visit: Payer: Self-pay | Admitting: Cardiovascular Disease

## 2024-08-09 ENCOUNTER — Encounter: Admitting: Vascular Surgery

## 2024-08-09 ENCOUNTER — Ambulatory Visit (HOSPITAL_COMMUNITY)

## 2024-08-10 ENCOUNTER — Ambulatory Visit: Attending: Nurse Practitioner | Admitting: Nurse Practitioner

## 2024-08-10 ENCOUNTER — Encounter: Payer: Self-pay | Admitting: Nurse Practitioner

## 2024-08-10 VITALS — BP 110/80 | HR 82 | Ht 61.0 in | Wt 160.4 lb

## 2024-08-10 DIAGNOSIS — Z95810 Presence of automatic (implantable) cardiac defibrillator: Secondary | ICD-10-CM | POA: Diagnosis present

## 2024-08-10 DIAGNOSIS — I10 Essential (primary) hypertension: Secondary | ICD-10-CM | POA: Insufficient documentation

## 2024-08-10 DIAGNOSIS — Z86718 Personal history of other venous thrombosis and embolism: Secondary | ICD-10-CM | POA: Diagnosis present

## 2024-08-10 DIAGNOSIS — R0789 Other chest pain: Secondary | ICD-10-CM | POA: Diagnosis present

## 2024-08-10 DIAGNOSIS — I428 Other cardiomyopathies: Secondary | ICD-10-CM | POA: Insufficient documentation

## 2024-08-10 DIAGNOSIS — I5020 Unspecified systolic (congestive) heart failure: Secondary | ICD-10-CM | POA: Insufficient documentation

## 2024-08-10 DIAGNOSIS — R911 Solitary pulmonary nodule: Secondary | ICD-10-CM | POA: Diagnosis present

## 2024-08-10 NOTE — Patient Instructions (Addendum)
 Medication Instructions:  Your physician recommends that you continue on your current medications as directed. Please refer to the Current Medication list given to you today.  Labwork: none  Testing/Procedures: none  Follow-Up: Your physician recommends that you schedule a follow-up appointment in: 1 year with Dr. Alvan. You will receive a reminder call in about 10 months reminding you to schedule your appointment. If you don't receive this call, please contact our office.  Any Other Special Instructions Will Be Listed Below (If Applicable).  If you need a refill on your cardiac medications before your next appointment, please call your pharmacy.

## 2024-08-10 NOTE — Progress Notes (Unsigned)
 Cardiology Office Note:   Date: 08/10/2024 ID:  Mary Pratt, DOB 19-Nov-1970, MRN [id number] PCP:  Toribio Jerel MATSU, MD [facility] Providers Cardiologist:  Alvan Carrier, MD Electrophysiologist:  Elspeth Sage, MD (Inactive)     Referring MD: Toribio Jerel MATSU, MD   CC: Here for scheduled follow-up  History of Present Illness:    Mary Pratt is a 53 y.o. female with a PMH of chronic systolic CHF, s/p ICD, NICM, hypertension, asthma, history of pain, GERD, acute DVT, and SS trait, who presents today for scheduled follow-up.   Cardiac catheterization in 2016 revealed no CAD.  EF returned to normal after CRT-D was placed in 2017.  Low A-fib burden on device check previously.  Not on anticoagulation.  ED visit on 10/08/2023 at [facility] for atypical CP. Nothing seemed to make it better/worse. Work-up was overall unremarkable.   She returned to the ED on October 10, 2023 for chest pain with associated SHOB x 3-4 days. No PE on CT scan. Workup was unremarkable.   10/19/2023 - Today she presents for follow-up.  She admits to several chief concerns including shortness of breath that is particularly noticed when laying down, admits to 2 pillow orthopnea, says she feels bloated, and also admits to insomnia.  Denies any recurrent chest pain since leaving the ED. Denies any palpitations, syncope, presyncope, dizziness, PND, swelling or significant weight changes, acute bleeding, or claudication.  She tells me she is now being followed by hematologist who is done a thorough workup regarding her history of sickle cell.  ED visit on 10/29/2023 for chest pain, workup was overall unremarkable.   11/30/2023 -today she presents for follow-up.  Doing better from when I last saw her. Denies any chest pain, palpitations, syncope, presyncope, dizziness, orthopnea, PND, swelling or significant weight changes, acute bleeding, or claudication. Continues to admit to occasional shortness of breath.   05/15/2024  -she is here today and admits to intermittent chest pain since March, denies any specific triggers and Tylenol  seems to help a little bit, admits to headache, chest pain episodes last only few seconds.  Chest pain goes to right to the middle of her chest, sharp in nature. She says laying down bothers her and admits to legs feeling numb.  Cannot stand for long, does admit to some hip pain. Denies any shortness of breath, palpitations, syncope, presyncope, dizziness, orthopnea, PND, swelling or significant weight changes, acute bleeding, or claudication.  She had recent ABIs done just a few days ago that were normal.  06/13/2024 - She is here today and continues to admit to similar symptoms since last office visit.  Says medication changes made at last visit have not made any difference. Denies any shortness of breath, palpitations, syncope, presyncope, dizziness, orthopnea, PND, swelling or significant weight changes, acute bleeding, or claudication.   08/10/2024 -since I have last seen her in the office, she has had several ED visits. Seen on 07/21/2024 for chest pain at [facility], also noted headache and neck pain.  Also noted shortness of breath and nausea as well as abdominal pain. All work-up unremarkable. Most recently seen on July 29, 2024 in the ED at [facility] with chief complaint of back pain.  CT scan revealed increased posterior disc bulge or herniation of L4-L5 compared with the previous exam, with increased mass effect upon the ventral canal.  No acute findings. All other work-up unremarkable. She is here for follow-up. Does have known small hiatal hernia. Notes similar symptoms since last  office visit. Denies any shortness of breath, palpitations, syncope, presyncope, dizziness, orthopnea, PND, swelling or significant weight changes, acute bleeding, or claudication.  Please see the history of present illness.    All other systems reviewed and are negative.  EKGs/Labs/Other Studies Reviewed:     The following studies were reviewed today:   EKG: EKG is not ordered today.  CCTA 06/2024:   Coronary calcium score 0. Normal coronary origin with right dominance. 3.   Minimal non-calcified plaque (<25%) in the LAD/LCX. 4.   Dilated pulmonary artery suggestive of pulmonary hypertension  Echo 10/2023:  1. Left ventricular ejection fraction, by estimation, is 55 to 60%. The  left ventricle has normal function. The left ventricle has no regional  wall motion abnormalities. There is mild left ventricular hypertrophy.  Left ventricular diastolic parameters  are indeterminate. The global longitudinal strain is indeterminate.   2. Right ventricular systolic function is normal. The right ventricular  size is normal. Tricuspid regurgitation signal is inadequate for assessing  PA pressure.   3. The mitral valve is normal in structure. No evidence of mitral valve  regurgitation. No evidence of mitral stenosis.   4. The tricuspid valve is abnormal.   5. The aortic valve is tricuspid. Aortic valve regurgitation is not  visualized. No aortic stenosis is present.   Comparison(s): A prior study was performed on 06/25/2021. EF 50-55%. Mild LVH.  Venous US  lower extremity doppler 10/2023: IMPRESSION: Positive for deep venous thrombosis isolated to the left anterior tibial vein.  Myoview  on 07/04/2021: Normal, low risk.   Echocardiogram on 06/25/2021:  1. Abnormal septal motion from pacing . Left ventricular ejection  fraction, by estimation, is 50 to 55%. The left ventricle has low normal  function. The left ventricle has no regional wall motion abnormalities.  There is mild left ventricular  hypertrophy. Left ventricular diastolic parameters were normal.   2. Pacing wires in RA/RV. Right ventricular systolic function is normal.  The right ventricular size is normal. There is normal pulmonary artery  systolic pressure.   3. The mitral valve is normal in structure. Trivial mitral valve   regurgitation. No evidence of mitral stenosis.   4. The aortic valve is normal in structure. Aortic valve regurgitation is  not visualized. No aortic stenosis is present.   5. The inferior vena cava is normal in size with greater than 50%  respiratory variability, suggesting right atrial pressure of 3 mmHg.  Right and left heart cath on 02/26/2015: There is severe left ventricular systolic dysfunction. Very mild elevation of right sided heart pressures Central aortic oxygen saturation 98%; pulmonary artery oxygen saturation 65%.   Dilated severe nonischemic cardiomyopathy with an ejection fraction of 15%.   Normal coronary arteries.   Mild pulmonary hypertension.   RECOMMENDATION:   Aggressive medical therapy for her nonischemic cardiomyopathy with medication titration over the next several months.  Consider interim LifeVest, pending follow-up LV function assessment in several months.  Physical Exam:    VS:  BP 110/80 (BP Location: Left Arm, Patient Position: Sitting, Cuff Size: Normal)   Pulse 82   Ht 5' 1 (1.549 m)   Wt 160 lb 6.4 oz (72.8 kg)   LMP 09/14/2022 (Approximate)   SpO2 97%   BMI 30.31 kg/m     Wt Readings from Last 3 Encounters:  08/10/24 160 lb 6.4 oz (72.8 kg)  07/29/24 163 lb 2.3 oz (74 kg)  07/19/24 163 lb (73.9 kg)     GEN: Obese, 53 y.o. female in  no acute distress HEENT: Normal NECK: No JVD; No carotid bruits CARDIAC: S1/S2, RRR, no murmurs, rubs, gallops; 2+ peripheral pulses throughout, strong and equal bilaterally RESPIRATORY:  Clear to auscultation without rales, wheezing or rhonchi  MUSCULOSKELETAL:  No edema; No deformity  SKIN: Warm and dry NEUROLOGIC:  Alert and oriented x 3 PSYCHIATRIC:  Normal affect   ASSESSMENT & PLAN:    In order of problems listed above:  Non-cardiac chest pain Chest pain etiology is non-cardiac, she has had extensive cardiac work-up that has all been reassuring.  Continue to follow with PCP to r/o non-cardiac  causes. Heart healthy diet and regular cardiovascular exercise encouraged.  Care and ED precautions discussed.    HFimpEF, s/p ICD, NICM Stage C, NYHA class I-II symptoms. EF normal 10/2023. Euvolemic and well compensated on exam.  Weights are stable.  Continue current medication regimen. Low sodium diet, fluid restriction <2L, and daily weights encouraged. Educated to contact our office for weight gain of 2 lbs overnight or 5 lbs in one week.  Denies any shocks from her ICD.  Most recent remote device check showed no clinically significant arrhythmia noted.  Continue follow-up with EP as scheduled.   Hypertension Blood pressure stable.  BP well-controlled at home.  No medication changes at this time.  Heart healthy diet and regular cardiovascular exercise encouraged.   4. Hx of DVT Most recent lower extremity Doppler was negative for DVT or popliteal cyst. Continue to follow with PCP.  5. Pulmonary nodule CT scan done at [facility] in November 2025 showed ground glass nodule of left upper lobe measuring 6 mm, follow-up CT scan recommended in 6-12 months to confirm persistence, recommend repeating testing next year at follow up.    Disposition: Follow-up with Dr. Dorn Ross or [facility] in 1 year or sooner if anything changes.  Medication Adjustments/Labs and Tests Ordered: Current medicines are reviewed at length with the patient today.  Concerns regarding medicines are outlined above.  No orders of the defined types were placed in this encounter.  No orders of the defined types were placed in this encounter.   Patient Instructions  Medication Instructions:  Your physician recommends that you continue on your current medications as directed. Please refer to the Current Medication list given to you today.  Labwork: none  Testing/Procedures: none  Follow-Up: Your physician recommends that you schedule a follow-up appointment in: 1 year with Dr. Ross. You will receive a reminder call in  about 10 months reminding you to schedule your appointment. If you don't receive this call, please contact our office.  Any Other Special Instructions Will Be Listed Below (If Applicable).  If you need a refill on your cardiac medications before your next appointment, please call your pharmacy.   Signed, Almarie Crate, NP

## 2024-08-11 ENCOUNTER — Ambulatory Visit: Admitting: Physician Assistant

## 2024-08-11 ENCOUNTER — Telehealth: Payer: Self-pay

## 2024-08-11 ENCOUNTER — Ambulatory Visit: Attending: Cardiovascular Disease | Admitting: Cardiovascular Disease

## 2024-08-11 NOTE — Telephone Encounter (Signed)
 Undersensing P waves w/ FF R waves noted on 07/25/2024 transmission --> [employer] recommend reprogramming atrial sensitivity from 0.100mV to 0.93mV and program absolute PVAB to cover up FFOS. If not effective, next considerations per MDT Representative is to test atrial sensitivity in unipolar configuration and review vector and blanking w/ local industry Rep.   Patient no-show for F/U appointment in [facility] w/ Dr. Nancey. Will send message to scheduling team to schedule next F/U appointment.

## 2024-08-14 ENCOUNTER — Encounter: Payer: Self-pay | Admitting: Hematology

## 2024-08-14 ENCOUNTER — Telehealth: Payer: Self-pay

## 2024-08-14 NOTE — Telephone Encounter (Signed)
 Pt called requesting an appt sooner than originally scheduled.  Sent scheduling message to Dr Demetra scheduler to get scheduled.

## 2024-08-16 ENCOUNTER — Ambulatory Visit (HOSPITAL_COMMUNITY)

## 2024-08-16 ENCOUNTER — Encounter: Admitting: Vascular Surgery

## 2024-08-17 ENCOUNTER — Encounter: Payer: Self-pay | Admitting: Obstetrics and Gynecology

## 2024-08-17 ENCOUNTER — Ambulatory Visit: Admitting: Obstetrics and Gynecology

## 2024-08-17 VITALS — HR 78 | Temp 98.0°F | Wt 164.0 lb

## 2024-08-17 DIAGNOSIS — N898 Other specified noninflammatory disorders of vagina: Secondary | ICD-10-CM | POA: Diagnosis not present

## 2024-08-17 DIAGNOSIS — R350 Frequency of micturition: Secondary | ICD-10-CM | POA: Diagnosis not present

## 2024-08-17 DIAGNOSIS — D229 Melanocytic nevi, unspecified: Secondary | ICD-10-CM | POA: Diagnosis not present

## 2024-08-17 DIAGNOSIS — R630 Anorexia: Secondary | ICD-10-CM | POA: Diagnosis not present

## 2024-08-17 DIAGNOSIS — R102 Pelvic and perineal pain unspecified side: Secondary | ICD-10-CM

## 2024-08-17 DIAGNOSIS — R140 Abdominal distension (gaseous): Secondary | ICD-10-CM

## 2024-08-17 DIAGNOSIS — Z01419 Encounter for gynecological examination (general) (routine) without abnormal findings: Secondary | ICD-10-CM

## 2024-08-17 DIAGNOSIS — L280 Lichen simplex chronicus: Secondary | ICD-10-CM

## 2024-08-17 DIAGNOSIS — K5900 Constipation, unspecified: Secondary | ICD-10-CM

## 2024-08-17 LAB — URINALYSIS, COMPLETE W/RFL CULTURE
Bacteria, UA: NONE SEEN /HPF
Bilirubin Urine: NEGATIVE
Glucose, UA: NEGATIVE
Hgb urine dipstick: NEGATIVE
Hyaline Cast: NONE SEEN /LPF
Ketones, ur: NEGATIVE
Leukocyte Esterase: NEGATIVE
Nitrites, Initial: NEGATIVE
RBC / HPF: NONE SEEN /HPF (ref 0–2)
Specific Gravity, Urine: 1.02 (ref 1.001–1.035)
WBC, UA: NONE SEEN /HPF (ref 0–5)
pH: 7 (ref 5.0–8.0)

## 2024-08-17 LAB — NO CULTURE INDICATED

## 2024-08-17 NOTE — Progress Notes (Signed)
° °  Acute Office Visit  Subjective:    Patient ID: Mary Pratt, female    DOB: 1971/01/31, 53 y.o.   MRN: [id number]   HPI 53 y.o. presents today for Acute Visit (Bloating, stomach pain, vaginal spotting twice last month, symptoms x 6 months, nausea) .She would like to make sure she does not have uterine or ovarian cancer. Had spotting last month after intercourse Also concerned with bladder. UA negative today  Patient's last menstrual period was 09/14/2022 (approximate).    Review of Systems     Objective:    OBGyn Exam  Pulse 78   Temp 98 F (36.7 C) (Oral)   Wt 164 lb (74.4 kg)   LMP 09/14/2022 (Approximate)   SpO2 97%   BMI 30.99 kg/m  Wt Readings from Last 3 Encounters:  08/17/24 164 lb (74.4 kg)  08/10/24 160 lb 6.4 oz (72.8 kg)  07/29/24 163 lb 2.3 oz (74 kg)   Pap smear in 5/25       Component Value Date/Time   DIAGPAP  01/13/2024 1405    - Benign reactive/reparative changes including parakeratosis   ADEQPAP  01/13/2024 1405    Satisfactory for evaluation; transformation zone component ABSENT.    SVE: normal discharge, no lesions  Assessment & Plan:  GI complaints Concern for uterine and ovarian cancer PM spotting last month after intercourse LS  RTC for PUS Nu swab sent today GI referral placed Referral to dermatology for LS and mole check  Dr. Glennon Almarie Mary Pratt Glennon

## 2024-08-18 ENCOUNTER — Ambulatory Visit: Payer: Self-pay | Admitting: Obstetrics and Gynecology

## 2024-08-18 LAB — SURESWAB® ADVANCED VAGINITIS PLUS,TMA
C. trachomatis RNA, TMA: NOT DETECTED
CANDIDA SPECIES: NOT DETECTED
Candida glabrata: NOT DETECTED
N. gonorrhoeae RNA, TMA: NOT DETECTED
SURESWAB(R) ADV BACTERIAL VAGINOSIS(BV),TMA: POSITIVE — AB
TRICHOMONAS VAGINALIS (TV),TMA: NOT DETECTED

## 2024-08-20 ENCOUNTER — Encounter (HOSPITAL_COMMUNITY): Payer: Self-pay

## 2024-08-20 ENCOUNTER — Emergency Department (HOSPITAL_COMMUNITY)

## 2024-08-20 ENCOUNTER — Emergency Department (HOSPITAL_COMMUNITY)
Admission: EM | Admit: 2024-08-20 | Discharge: 2024-08-20 | Disposition: A | Discharge status: Home / Self Care | Attending: Emergency Medicine | Admitting: Emergency Medicine

## 2024-08-20 ENCOUNTER — Other Ambulatory Visit: Payer: Self-pay

## 2024-08-20 DIAGNOSIS — M79605 Pain in left leg: Secondary | ICD-10-CM

## 2024-08-20 LAB — BASIC METABOLIC PANEL WITH GFR
Anion gap: 10 (ref 5–15)
BUN: 7 mg/dL (ref 6–20)
CO2: 24 mmol/L (ref 22–32)
Calcium: 8.9 mg/dL (ref 8.9–10.3)
Chloride: 103 mmol/L (ref 98–111)
Creatinine, Ser: 1.08 mg/dL — ABNORMAL HIGH (ref 0.44–1.00)
GFR, Estimated: >60 mL/min (ref >60)
Glucose, Bld: 122 mg/dL — ABNORMAL HIGH (ref 70–99)
Potassium: 4 mmol/L (ref 3.5–5.1)
Sodium: 137 mmol/L (ref 135–145)

## 2024-08-20 LAB — CBC WITH DIFFERENTIAL/PLATELET
Abs Immature Granulocytes: 0.02 K/uL (ref 0.00–0.07)
Basophils Absolute: 0 K/uL (ref 0.0–0.1)
Basophils Relative: 1 %
Eosinophils Absolute: 0.2 K/uL (ref 0.0–0.5)
Eosinophils Relative: 4 %
HCT: 36.1 % (ref 36.0–46.0)
Hemoglobin: 12 g/dL (ref 12.0–15.0)
Immature Granulocytes: 0 %
Lymphocytes Relative: 34 %
Lymphs Abs: 2 K/uL (ref 0.7–4.0)
MCH: 26.8 pg (ref 26.0–34.0)
MCHC: 33.2 g/dL (ref 30.0–36.0)
MCV: 80.6 fL (ref 80.0–100.0)
Monocytes Absolute: 0.4 K/uL (ref 0.1–1.0)
Monocytes Relative: 8 %
Neutro Abs: 3.1 K/uL (ref 1.7–7.7)
Neutrophils Relative %: 53 %
Platelets: 218 K/uL (ref 150–400)
RBC: 4.48 MIL/uL (ref 3.87–5.11)
RDW: 14.7 % (ref 11.5–15.5)
WBC: 5.8 K/uL (ref 4.0–10.5)
nRBC: 0 % (ref 0.0–0.2)

## 2024-08-20 LAB — TROPONIN I (HIGH SENSITIVITY): Troponin I (High Sensitivity): 5 ng/L (ref <18)

## 2024-08-20 LAB — BRAIN NATRIURETIC PEPTIDE: B Natriuretic Peptide: 5 pg/mL (ref 0.0–100.0)

## 2024-08-20 NOTE — Progress Notes (Addendum)
 VASCULAR LAB    Left lower extremity venous duplex has been performed.  See CV proc for preliminary results.  Relayed results to Warren Lemming, RN via secure chat  RACHEL PELLET, RVT 08/20/2024, 2:50 PM
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
## 2024-08-20 NOTE — ED Triage Notes (Signed)
 Pt c/o Pt non radiating midsternal chest pain, N/V, SOBx3d. Pt c/o left leg painx1wk. Pt denies injury.

## 2024-08-20 NOTE — ED Provider Triage Note (Signed)
 Emergency Medicine Provider Triage Evaluation Note  Mary Pratt , a 53 y.o. female  was evaluated in triage.  Pt complains of chest pain and bilateral leg pain.  Patient states she has had chest pain for about a week or so and it comes and goes.  She states the pain does not radiate to her back or down her arm.  She does have a history of CHF.  Patient is also reporting shortness of breath.  Patient has a reported history of prior blood clots and thinks she has a blood clot in her leg.  She does have some mild calf tenderness to palpation on her bilateral lower extremities.  DP pulses intact..  Review of Systems  Positive: See above Negative: See above  Physical Exam  Ht 5' 1 (1.549 m)   Wt 71.2 kg   LMP 09/14/2022   BMI 29.66 kg/m  Gen:   Awake, no distress   Resp:  Normal effort  MSK:   Moves extremities without difficulty.  Patient does have reproducible pain in her lower extremities to palpation.  DP pulses intact Other:    Medical Decision Making  Medically screening exam initiated at 1:24 PM.  Appropriate orders placed.  Mary Pratt was informed that the remainder of the evaluation will be completed by another provider, this initial triage assessment does not replace that evaluation, and the importance of remaining in the ED until their evaluation is complete.  Basic labs, troponin, BNP, EKG ordered.   Torrence Marry RAMAN, PA-C 08/20/24 1325

## 2024-08-20 NOTE — Discharge Instructions (Addendum)
 Return to the ER if you have any medical emergency concerns. Otherwise you should follow-up with your outpatient doctors for your chronic or ongoing medical issues.

## 2024-08-21 ENCOUNTER — Encounter (HOSPITAL_COMMUNITY): Payer: Self-pay

## 2024-08-21 ENCOUNTER — Other Ambulatory Visit (HOSPITAL_COMMUNITY)
Admission: RE | Admit: 2024-08-21 | Discharge: 2024-08-21 | Disposition: A | Discharge status: Home / Self Care | Source: Ambulatory Visit | Attending: Obstetrics and Gynecology | Admitting: Obstetrics and Gynecology

## 2024-08-21 ENCOUNTER — Emergency Department (HOSPITAL_COMMUNITY)
Admission: EM | Admit: 2024-08-21 | Discharge: 2024-08-21 | Disposition: A | Discharge status: Home / Self Care | Attending: Emergency Medicine | Admitting: Emergency Medicine

## 2024-08-21 ENCOUNTER — Other Ambulatory Visit: Payer: Self-pay

## 2024-08-21 ENCOUNTER — Telehealth: Payer: Self-pay

## 2024-08-21 ENCOUNTER — Emergency Department (HOSPITAL_COMMUNITY)

## 2024-08-21 DIAGNOSIS — Z01419 Encounter for gynecological examination (general) (routine) without abnormal findings: Secondary | ICD-10-CM | POA: Insufficient documentation

## 2024-08-21 DIAGNOSIS — R300 Dysuria: Secondary | ICD-10-CM | POA: Diagnosis not present

## 2024-08-21 DIAGNOSIS — R5383 Other fatigue: Secondary | ICD-10-CM | POA: Diagnosis not present

## 2024-08-21 DIAGNOSIS — I509 Heart failure, unspecified: Secondary | ICD-10-CM | POA: Diagnosis not present

## 2024-08-21 DIAGNOSIS — Z79899 Other long term (current) drug therapy: Secondary | ICD-10-CM | POA: Diagnosis not present

## 2024-08-21 DIAGNOSIS — R110 Nausea: Secondary | ICD-10-CM | POA: Diagnosis not present

## 2024-08-21 DIAGNOSIS — I110 Hypertensive heart disease with heart failure: Secondary | ICD-10-CM | POA: Diagnosis not present

## 2024-08-21 DIAGNOSIS — J45909 Unspecified asthma, uncomplicated: Secondary | ICD-10-CM | POA: Diagnosis not present

## 2024-08-21 DIAGNOSIS — N898 Other specified noninflammatory disorders of vagina: Secondary | ICD-10-CM | POA: Insufficient documentation

## 2024-08-21 DIAGNOSIS — Z7951 Long term (current) use of inhaled steroids: Secondary | ICD-10-CM | POA: Diagnosis not present

## 2024-08-21 DIAGNOSIS — Z7901 Long term (current) use of anticoagulants: Secondary | ICD-10-CM | POA: Diagnosis not present

## 2024-08-21 DIAGNOSIS — R1084 Generalized abdominal pain: Secondary | ICD-10-CM | POA: Diagnosis present

## 2024-08-21 DIAGNOSIS — R109 Unspecified abdominal pain: Secondary | ICD-10-CM

## 2024-08-21 LAB — COMPREHENSIVE METABOLIC PANEL WITH GFR
ALT: 26 U/L (ref 0–44)
AST: 37 U/L (ref 15–41)
Albumin: 4.4 g/dL (ref 3.5–5.0)
Alkaline Phosphatase: 106 U/L (ref 38–126)
Anion gap: 12 (ref 5–15)
BUN: 8 mg/dL (ref 6–20)
CO2: 24 mmol/L (ref 22–32)
Calcium: 9.7 mg/dL (ref 8.9–10.3)
Chloride: 104 mmol/L (ref 98–111)
Creatinine, Ser: 0.84 mg/dL (ref 0.44–1.00)
GFR, Estimated: >60 mL/min (ref >60)
Glucose, Bld: 97 mg/dL (ref 70–99)
Potassium: 4.2 mmol/L (ref 3.5–5.1)
Sodium: 139 mmol/L (ref 135–145)
Total Bilirubin: 0.4 mg/dL (ref 0.0–1.2)
Total Protein: 8 g/dL (ref 6.5–8.1)

## 2024-08-21 LAB — URINALYSIS, ROUTINE W REFLEX MICROSCOPIC
Bilirubin Urine: NEGATIVE
Glucose, UA: NEGATIVE mg/dL
Hgb urine dipstick: NEGATIVE
Ketones, ur: NEGATIVE mg/dL
Leukocytes,Ua: NEGATIVE
Nitrite: NEGATIVE
Protein, ur: NEGATIVE mg/dL
Specific Gravity, Urine: 1.016 (ref 1.005–1.030)
pH: 6 (ref 5.0–8.0)

## 2024-08-21 LAB — CBC
HCT: 38.1 % (ref 36.0–46.0)
Hemoglobin: 12.7 g/dL (ref 12.0–15.0)
MCH: 26.7 pg (ref 26.0–34.0)
MCHC: 33.3 g/dL (ref 30.0–36.0)
MCV: 80 fL (ref 80.0–100.0)
Platelets: 235 K/uL (ref 150–400)
RBC: 4.76 MIL/uL (ref 3.87–5.11)
RDW: 14.8 % (ref 11.5–15.5)
WBC: 6.9 K/uL (ref 4.0–10.5)
nRBC: 0 % (ref 0.0–0.2)

## 2024-08-21 LAB — LIPASE, BLOOD: Lipase: 22 U/L (ref 11–51)

## 2024-08-21 MED ORDER — METRONIDAZOLE 500 MG PO TABS: 500.0000 mg | ORAL_TABLET | Freq: Two times a day (BID) | ORAL | 0 refills | Status: DC

## 2024-08-21 MED ORDER — IOHEXOL 300 MG/ML  SOLN
100.0000 mL | Freq: Once | INTRAMUSCULAR | Status: AC | PRN
  Administered 2024-08-21: 20:00:00 100 mL via INTRAVENOUS

## 2024-08-21 NOTE — Discharge Instructions (Addendum)
 Schedule follow up with your primary care physician.  Continue taking your medications as directed

## 2024-08-21 NOTE — ED Provider Notes (Signed)
 [place] EMERGENCY DEPARTMENT AT [facility] Provider Note   CSN: [id number] Arrival date & time: 08/21/24  1332     Patient presents with: Abdominal Pain   Mary Pratt is a 53 y.o. female.   Patient has multiple concerns.  Patient states that she worries that she could have a blood clot in her legs.  Patient did have a ultrasound yesterday which was negative.  She is taking Eliquis  and states she has not missed any dosages.  Patient also reports she has had abdominal pain.  She is concerned that there is something wrong with her kidneys.  Patient states she is not urinating normally.  Patient reports she called her provider and was told to come to the emergency department.  Patient reports she has had nausea she has not had any vomiting she has not had any diarrhea.  Patient has a past medical history of congestive heart failure cardiomyopathy hypertension sickle cell trait anemia previous DVT asthma and vitamin D  deficiency.  The history is provided by the patient. No language interpreter was used.  Abdominal Pain Associated symptoms: dysuria, fatigue and nausea        Prior to Admission medications  Medication Sig Start Date End Date Taking? Authorizing Provider  albuterol  (VENTOLIN  HFA) 108 (90 Base) MCG/ACT inhaler Inhale 1-2 puffs into the lungs every 4 (four) hours as needed. 01/07/24   Cari Arlean HERO, FNP  amitriptyline  (ELAVIL ) 25 MG tablet TAKE 1 TABLET BY MOUTH EVERYDAY AT BEDTIME 03/20/24   Avram Lupita BRAVO, MD  amLODipine  (NORVASC ) 2.5 MG tablet Take 1 tablet (2.5 mg total) by mouth daily. 05/15/24 08/17/24  Miriam Norris, NP  apixaban  (ELIQUIS ) 5 MG TABS tablet Take 1 tablet (5 mg total) by mouth 2 (two) times daily. Start taking after completion of starter pack. 11/01/23   Barbarann Dixon B, RPH-CPP  Azelastine  HCl 137 MCG/SPRAY SOLN Place 2 sprays into both nostrils 2 (two) times daily. 11/16/23   [provider]  carvedilol  (COREG ) 25 MG tablet Take 0.5  tablets (12.5 mg total) by mouth 2 (two) times daily with a meal. 05/15/24   Miriam Norris, NP  clobetasol  ointment (TEMOVATE ) 0.05 % Apply 1 Application topically See admin instructions. Apply to area with itching 3 times a day for a week then twice a day for 2 weeks then daily for 1 week then twice weekly for a week.  To use with only flares 02/17/24   Glennon Norris POUR, MD  conjugated estrogens  (PREMARIN ) vaginal cream Place 1 Applicatorful vaginally 3 (three) times a week. 03/01/24   Glennon Norris POUR, MD  Cyanocobalamin  (VITAMIN B-12 IJ) Inject as directed every 30 (thirty) days.    [provider]  EPINEPHRINE  0.3 mg/0.3 mL IJ SOAJ injection INJECT 0.3 MG INTO THE MUSCLE AS NEEDED FOR ANAPHYLAXIS. 01/12/24   Ambs, Arlean HERO, FNP  fexofenadine (ALLEGRA) 180 MG tablet Take 180 mg by mouth daily. 01/13/24   [provider]  fluticasone  (FLONASE ) 50 MCG/ACT nasal spray Place 2 sprays into both nostrils every morning. 01/07/24   Cari Arlean HERO, FNP  fluticasone -salmeterol (ADVAIR DISKUS) 250-50 MCG/ACT AEPB Inhale 1 puff into the lungs in the morning. 05/05/24   Ambs, Arlean HERO, FNP  furosemide  (LASIX ) 40 MG tablet Take 1 tablet (40 mg total) by mouth daily. 01/17/24   Miriam Norris, NP  gabapentin (NEURONTIN) 100 MG capsule Take 1 tablet by mouth at night as tolerated 02/09/24   [provider]  Galcanezumab -gnlm (EMGALITY ) 120 MG/ML  SOAJ Inject 120 mg into the skin every 28 (twenty-eight) days. 05/18/24   Skeet Juliene SAUNDERS, DO  lidocaine  (LIDODERM ) 5 % Place 1 patch onto the skin daily. Remove & Discard patch within 12 hours or as directed by MD 07/29/24   Theadore Ozell HERO, MD  methocarbamol  (ROBAXIN ) 500 MG tablet Take 1 tablet (500 mg total) by mouth every 8 (eight) hours as needed for muscle spasms. 07/29/24   Theadore Ozell HERO, MD  metroNIDAZOLE  (FLAGYL ) 500 MG tablet Take 1 tablet (500 mg total) by mouth 2 (two) times daily. 08/21/24   Glennon Almarie POUR, MD  nitroGLYCERIN  (NITROSTAT )  0.4 MG SL tablet Place 1 tablet (0.4 mg total) under the tongue every 5 (five) minutes x 3 doses as needed for chest pain. 05/15/24   Miriam Almarie, NP  pantoprazole  (PROTONIX ) 40 MG tablet Take 40 mg by mouth daily.    [provider]  sacubitril -valsartan  (ENTRESTO ) 24-26 MG TAKE 1 TABLET BY MOUTH TWICE A DAY 01/13/24   Alvan Dorn FALCON, MD  sertraline  (ZOLOFT ) 100 MG tablet Take 100 mg by mouth daily. 01/24/20   [provider]  spironolactone  (ALDACTONE ) 25 MG tablet TAKE 1 TABLET (25 MG TOTAL) BY MOUTH DAILY. 01/24/24   Alvan Dorn FALCON, MD  traMADol  (ULTRAM ) 50 MG tablet Take 1 tablet (50 mg total) by mouth every 6 (six) hours as needed. 07/19/24   Steinl, Kevin, MD  Vitamin D , Ergocalciferol , (DRISDOL) 1.25 MG (50000 UNIT) CAPS capsule Take 50,000 Units by mouth once a week. 10/26/23   [provider]    Allergies: Clarithromycin, Amoxicillin, Meloxicam, Spiriva respimat [tiotropium bromide], and Symbicort  [budesonide -formoterol  fumarate]    Review of Systems  Constitutional:  Positive for fatigue.  Gastrointestinal:  Positive for abdominal pain and nausea.  Genitourinary:  Positive for difficulty urinating and dysuria.  Musculoskeletal:  Positive for back pain.  All other systems reviewed and are negative.   Updated Vital Signs BP (!) 126/100   Pulse 80   Temp 98.4 F (36.9 C) (Oral)   Resp 16   Ht 5' 1 (1.549 m)   Wt 72.6 kg   LMP 09/14/2022   SpO2 97%   BMI 30.23 kg/m   Physical Exam Vitals and nursing note reviewed.  Constitutional:      Appearance: She is well-developed.  HENT:     Head: Normocephalic.  Eyes:     Extraocular Movements: Extraocular movements intact.  Cardiovascular:     Rate and Rhythm: Normal rate.  Pulmonary:     Effort: Pulmonary effort is normal.  Abdominal:     General: Abdomen is flat. There is no distension.     Palpations: Abdomen is soft.     Tenderness: There is generalized abdominal tenderness.   Musculoskeletal:        General: Normal range of motion.     Cervical back: Normal range of motion.  Skin:    General: Skin is warm.  Neurological:     General: No focal deficit present.     Mental Status: She is alert and oriented to person, place, and time.     (all labs ordered are listed, but only abnormal results are displayed) Labs Reviewed  LIPASE, BLOOD  COMPREHENSIVE METABOLIC PANEL WITH GFR  CBC  URINALYSIS, ROUTINE W REFLEX MICROSCOPIC    EKG: None  Radiology: CT ABDOMEN PELVIS W CONTRAST Result Date: 08/21/2024 EXAM: CT ABDOMEN AND PELVIS WITH CONTRAST 08/21/2024 07:43:53 PM TECHNIQUE: CT of the abdomen and pelvis was performed with  the administration of 100 mL of iohexol  (OMNIPAQUE ) 300 MG/ML solution. Multiplanar reformatted images are provided for review. Automated exposure control, iterative reconstruction, and/or weight-based adjustment of the mA/kV was utilized to reduce the radiation dose to as low as reasonably achievable. COMPARISON: 07/29/2024. CLINICAL HISTORY: Abdominal pain, acute, nonlocalized. FINDINGS: LOWER CHEST: ICD leads, incompletely visualized. LIVER: Benign hepatic cysts. GALLBLADDER AND BILE DUCTS: Gallbladder is unremarkable. No biliary ductal dilatation. SPLEEN: No acute abnormality. PANCREAS: No acute abnormality. ADRENAL GLANDS: No acute abnormality. KIDNEYS, URETERS AND BLADDER: 13 mm simple right upper pole renal cyst, benign (Bosniak 1). Per consensus, no follow-up is needed for simple Bosniak type 1 and 2 renal cysts, unless the patient has a malignancy history or risk factors. No stones in the kidneys or ureters. No hydronephrosis. No perinephric or periureteral stranding. Urinary bladder is unremarkable. GI AND BOWEL: Tiny hiatal hernia. Normal appendix (image 49). Stomach demonstrates no acute abnormality. There is no bowel obstruction. PERITONEUM AND RETROPERITONEUM: No ascites. No free air. VASCULATURE: Aorta is normal in caliber. LYMPH  NODES: No lymphadenopathy. REPRODUCTIVE ORGANS: The uterus is within normal limits. Bilateral ovaries are within normal limits. BONES AND SOFT TISSUES: Mild degenerative changes at L4-L5. No acute osseous abnormality. No focal soft tissue abnormality. IMPRESSION: 1. No acute findings in the abdomen or pelvis. Electronically signed by: Pinkie Pebbles MD 08/21/2024 07:47 PM EST RP Workstation: [id number]   VAS US  LOWER EXTREMITY VENOUS (DVT) (7a-7p) Result Date: 08/20/2024  Lower Venous DVT Study Patient Name:  Mary Pratt  Date of Exam:   08/20/2024 Medical Rec #: [id number]       Accession #:    [id number] Date of Birth: [birth year]      Patient Gender: F Patient Age:   19 years Exam Location:  [facility] Procedure:      VAS US  LOWER EXTREMITY VENOUS (DVT) Referring Phys: MARRY DUFOUR --------------------------------------------------------------------------------  Indications: Left leg pain.  Risk Factors: DVT noted in Anterior tibial vein 10/24/23 at [facility]. Anticoagulation: Eliquis . Comparison Study: Prior negative bilateral LEV done 05/03/2024. Prior negative                   left LEV done 12/27/56 Performing Technologist: Alberta Lis RVS  Examination Guidelines: A complete evaluation includes B-mode imaging, spectral Doppler, color Doppler, and power Doppler as needed of all accessible portions of each vessel. Bilateral testing is considered an integral part of a complete examination. Limited examinations for reoccurring indications may be performed as noted. The reflux portion of the exam is performed with the patient in reverse Trendelenburg.  +-----+---------------+---------+-----------+----------+--------------+ RIGHTCompressibilityPhasicitySpontaneityPropertiesThrombus Aging +-----+---------------+---------+-----------+----------+--------------+ CFV  Full           Yes      Yes                                  +-----+---------------+---------+-----------+----------+--------------+ SFJ  Full                                                        +-----+---------------+---------+-----------+----------+--------------+   +---------+---------------+---------+-----------+----------+--------------+ LEFT     CompressibilityPhasicitySpontaneityPropertiesThrombus Aging +---------+---------------+---------+-----------+----------+--------------+ CFV      Full           Yes      Yes                                 +---------+---------------+---------+-----------+----------+--------------+  SFJ      Full                                                        +---------+---------------+---------+-----------+----------+--------------+ FV Prox  Full                                                        +---------+---------------+---------+-----------+----------+--------------+ FV Mid   Full           Yes      Yes                                 +---------+---------------+---------+-----------+----------+--------------+ FV DistalFull           Yes      Yes                                 +---------+---------------+---------+-----------+----------+--------------+ PFV      Full                                                        +---------+---------------+---------+-----------+----------+--------------+ POP      Full           Yes      Yes                                 +---------+---------------+---------+-----------+----------+--------------+ PTV      Full                                                        +---------+---------------+---------+-----------+----------+--------------+ PERO     Full                                                        +---------+---------------+---------+-----------+----------+--------------+ Gastroc  Full                                                         +---------+---------------+---------+-----------+----------+--------------+ ATV      Full                                                        +---------+---------------+---------+-----------+----------+--------------+  Summary: RIGHT: - No evidence of common femoral vein obstruction.   LEFT: - Findings suggest resolution of previously noted thrombus in the ATV 10/2023. - There is no evidence of deep vein thrombosis in the lower extremity.  - No cystic structure found in the popliteal fossa.  *See table(s) above for measurements and observations. Electronically signed by Debby Robertson on 08/20/2024 at 8:16:09 PM.    Final      Procedures   Medications Ordered in the ED  iohexol  (OMNIPAQUE ) 300 MG/ML solution 100 mL (100 mLs Intravenous Contrast Given 08/21/24 1935)                                    Medical Decision Making Patient complains of worrying that she could have a blood clot.  Patient is also concerned that something is wrong with her kidneys.  Amount and/or Complexity of Data Reviewed Labs: ordered. Decision-making details documented in ED Course.    Details: Labs ordered reviewed and interpreted UA is negative CBC is normal chemistry is normal. Radiology: ordered and independent interpretation performed. Decision-making details documented in ED Course.    Details: Patient is insistent on having a CT scan.  Patient also wants repeat ultrasounding of her legs.  Patient advised that if she is taking her Eliquis  and had a normal ultrasound yesterday she does not need to have repeat ultrasounds.  Patient is insistent that she needs a CT scan.  Patient has a tiny renal cyst.  She has a small hiatal hernia.  No acute findings.  Risk Prescription drug management. Risk Details: Patient is counseled on her results.  She is advised she needs to follow-up with her primary care physician who can address all of her concerns.  Patient is advised to continue taking her medications she  is discharged in stable condition        Final diagnoses:  Abdominal pain, unspecified abdominal location    ED Discharge Orders     None      An After Visit Summary was printed and given to the patient.     Benford Asch K, PA-C 08/21/24 2052    Randol Simmonds, MD 08/23/24 [phone]

## 2024-08-21 NOTE — Addendum Note (Signed)
 Addended by: GLENNON ALMARIE POUR on: 08/21/2024 02:03 PM   Modules accepted: Orders

## 2024-08-21 NOTE — ED Triage Notes (Signed)
 Generalized abdominal pain and flank pain for months, pt states she saw nephrologist and they did not diagnose her with anything. Denies V/D. C/o nausea

## 2024-08-21 NOTE — Telephone Encounter (Signed)
 Pt called stating she recently went to the [facility] ED to r/o DVT in her BLE.  Pt stated during the time of the visit she mentioned she was having abdominal edema as a result of her kidneys but during visit in ED pt stated it was never assessed.  Pt was (-) for DVT in her BLE.  Pt contacted Dr Demetra office stating that she's having abdominal swelling, decreased urination, constipation, and peripheral edema.  Pt denied SOB and increased fatigue.  Instructed pt to go back to the ED for further evaluation of her renal function, constipation, and abdominal edema.  Pt verbalized understanding and had no further questions or concerns.

## 2024-08-21 NOTE — ED Provider Triage Note (Signed)
 Emergency Medicine Provider Triage Evaluation Note  Mary Pratt , a 53 y.o. female  was evaluated in triage.  Pt complains of chest and abdominal pain.  Pt was seen yesterday  Review of Systems  Positive: Pt worried about kidneys Negative: fever  Physical Exam  BP (!) 126/100   Pulse 80   Temp 98.4 F (36.9 C) (Oral)   Resp 16   Ht 5' 1 (1.549 m)   Wt 72.6 kg   LMP 09/14/2022   SpO2 97%   BMI 30.23 kg/m  Gen:   Awake, no distress   Resp:  Normal effort  MSK:   Moves extremities without difficulty  Other:    Medical Decision Making  Medically screening exam initiated at 2:36 PM.  Appropriate orders placed.  [name] was informed that the remainder of the evaluation will be completed by another provider, this initial triage assessment does not replace that evaluation, and the importance of remaining in the ED until their evaluation is complete.     Flint Sonny POUR, PA-C 08/21/24 1437

## 2024-08-22 ENCOUNTER — Other Ambulatory Visit: Payer: Self-pay | Admitting: Nurse Practitioner

## 2024-08-22 ENCOUNTER — Telehealth: Payer: Self-pay

## 2024-08-22 ENCOUNTER — Other Ambulatory Visit (HOSPITAL_COMMUNITY): Payer: Self-pay | Admitting: Student

## 2024-08-22 DIAGNOSIS — I8290 Acute embolism and thrombosis of unspecified vein: Secondary | ICD-10-CM

## 2024-08-22 DIAGNOSIS — M5126 Other intervertebral disc displacement, lumbar region: Secondary | ICD-10-CM

## 2024-08-22 NOTE — Telephone Encounter (Signed)
 Patient no showed for her 1 yr/establish with [facility] on atrial lead in [place] this month.  (The weather was bad, may have been related).   Can you call her and get her back in the schedule in [place] next month?  Please let her know that it is very important that we make programming adjustments to her device. We can offer her sooner here in the office in [place] if she can come.  She also needs f/u with provider.

## 2024-08-22 NOTE — Assessment & Plan Note (Deleted)
 Previous DVT in the left leg has resolved, confirmed by a recent ultrasound showing no blood clots yesterday. Currently on Eliquis , planned to continue until early August, completing a six-month course from the initial diagnosis on February 16. - Cancel the previously scheduled ultrasound for August 8, as the recent ultrasound confirmed resolution of DVT. - Schedule lab work for August 8 after stopping Eliquis  for at least a week. - Coordinate follow-up with Dr. Davonna after lab work in August. - Since lab work done off of Eliquis  showed elevated D-dimer at 1.64, DRVVT mix positive at 41.9.  DRVVT confirmatory test was negative.  Hexagonal phase phospholipid support elevated at 12.  PTT LA mix also positive.  PTT LA confirmatory test was negative. -Restart Eliquis  5 mg twice daily.  Bilateral lower extremity Doppler ordered to evaluate for new DVT.  Autoimmune screening labs ordered as part of today's visit.  Referral to [facility] hematology per patient request provided today.  -Plan for follow-up with patient after labs and ultrasound results are available.
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
## 2024-08-23 NOTE — Telephone Encounter (Signed)
 Spoke w/ patient - she is scheduled to see Dr. Nancey on 1/12.

## 2024-08-24 LAB — CYTOLOGY - PAP
Adequacy: ABSENT
Diagnosis: NEGATIVE

## 2024-08-25 ENCOUNTER — Ambulatory Visit: Admitting: Gastroenterology

## 2024-08-25 ENCOUNTER — Telehealth: Payer: Self-pay | Admitting: Nurse Practitioner

## 2024-08-25 ENCOUNTER — Inpatient Hospital Stay: Attending: Oncology

## 2024-08-25 ENCOUNTER — Inpatient Hospital Stay: Admitting: Nurse Practitioner

## 2024-09-01 ENCOUNTER — Encounter: Payer: Self-pay | Admitting: Nurse Practitioner

## 2024-09-03 NOTE — Assessment & Plan Note (Addendum)
 Previous DVT in the left leg has resolved, confirmed by a recent ultrasound showing no blood clots yesterday. Currently on Eliquis , planned to continue until early August, completing a six-month course from the initial diagnosis on February 16. - Cancel the previously scheduled ultrasound for August 8, as the recent ultrasound confirmed resolution of DVT. - Schedule lab work for August 8 after stopping Eliquis  for at least a week. - Coordinate follow-up with Dr. Davonna after lab work in August. - Since lab work done off of Eliquis  showed elevated D-dimer at 1.64, DRVVT mix positive at 41.9.  DRVVT confirmatory test was negative.  Hexagonal phase phospholipid support elevated at 12.  PTT LA mix also positive.  PTT LA confirmatory test was negative. -Repeat D-dimer and bilateral lower extremity Doppler to evaluate for new or worsening blood clots. -Plan for follow-up with patient after labs and ultrasound results are available.
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
## 2024-09-04 ENCOUNTER — Other Ambulatory Visit: Payer: Self-pay

## 2024-09-04 ENCOUNTER — Inpatient Hospital Stay: Admitting: Nurse Practitioner

## 2024-09-04 ENCOUNTER — Inpatient Hospital Stay: Attending: Oncology

## 2024-09-04 ENCOUNTER — Inpatient Hospital Stay

## 2024-09-04 VITALS — BP 112/87 | HR 85 | Temp 97.3°F | Resp 16 | Wt 161.0 lb

## 2024-09-04 DIAGNOSIS — Z7901 Long term (current) use of anticoagulants: Secondary | ICD-10-CM | POA: Insufficient documentation

## 2024-09-04 DIAGNOSIS — I82442 Acute embolism and thrombosis of left tibial vein: Secondary | ICD-10-CM

## 2024-09-04 DIAGNOSIS — Z86718 Personal history of other venous thrombosis and embolism: Secondary | ICD-10-CM | POA: Insufficient documentation

## 2024-09-04 DIAGNOSIS — I82492 Acute embolism and thrombosis of other specified deep vein of left lower extremity: Secondary | ICD-10-CM

## 2024-09-04 DIAGNOSIS — R7989 Other specified abnormal findings of blood chemistry: Secondary | ICD-10-CM | POA: Insufficient documentation

## 2024-09-04 DIAGNOSIS — M79604 Pain in right leg: Secondary | ICD-10-CM

## 2024-09-04 DIAGNOSIS — I8290 Acute embolism and thrombosis of unspecified vein: Secondary | ICD-10-CM

## 2024-09-04 LAB — CMP (CANCER CENTER ONLY)
ALT: 27 U/L (ref 0–44)
AST: 28 U/L (ref 15–41)
Albumin: 4.6 g/dL (ref 3.5–5.0)
Alkaline Phosphatase: 98 U/L (ref 38–126)
Anion gap: 13 (ref 5–15)
BUN: 11 mg/dL (ref 6–20)
CO2: 27 mmol/L (ref 22–32)
Calcium: 10.5 mg/dL — ABNORMAL HIGH (ref 8.9–10.3)
Chloride: 100 mmol/L (ref 98–111)
Creatinine: 0.95 mg/dL (ref 0.44–1.00)
GFR, Estimated: >60 mL/min
Glucose, Bld: 127 mg/dL — ABNORMAL HIGH (ref 70–99)
Potassium: 4 mmol/L (ref 3.5–5.1)
Sodium: 139 mmol/L (ref 135–145)
Total Bilirubin: 0.5 mg/dL (ref 0.0–1.2)
Total Protein: 8.3 g/dL — ABNORMAL HIGH (ref 6.5–8.1)

## 2024-09-04 LAB — D-DIMER, QUANTITATIVE: D-Dimer, Quant: 0.87 ug{FEU}/mL — ABNORMAL HIGH (ref 0.00–0.50)

## 2024-09-04 LAB — CBC WITH DIFFERENTIAL (CANCER CENTER ONLY)
Abs Immature Granulocytes: 0 K/uL (ref 0.00–0.07)
Basophils Absolute: 0 K/uL (ref 0.0–0.1)
Basophils Relative: 1 %
Eosinophils Absolute: 0.2 K/uL (ref 0.0–0.5)
Eosinophils Relative: 3 %
HCT: 40.4 % (ref 36.0–46.0)
Hemoglobin: 13.5 g/dL (ref 12.0–15.0)
Immature Granulocytes: 0 %
Lymphocytes Relative: 39 %
Lymphs Abs: 2.2 K/uL (ref 0.7–4.0)
MCH: 26.2 pg (ref 26.0–34.0)
MCHC: 33.4 g/dL (ref 30.0–36.0)
MCV: 78.3 fL — ABNORMAL LOW (ref 80.0–100.0)
Monocytes Absolute: 0.5 K/uL (ref 0.1–1.0)
Monocytes Relative: 8 %
Neutro Abs: 2.9 K/uL (ref 1.7–7.7)
Neutrophils Relative %: 49 %
Platelet Count: 259 K/uL (ref 150–400)
RBC: 5.16 MIL/uL — ABNORMAL HIGH (ref 3.87–5.11)
RDW: 14.7 % (ref 11.5–15.5)
WBC Count: 5.8 K/uL (ref 4.0–10.5)
nRBC: 0 % (ref 0.0–0.2)

## 2024-09-05 ENCOUNTER — Emergency Department (HOSPITAL_COMMUNITY)

## 2024-09-05 ENCOUNTER — Ambulatory Visit: Payer: Self-pay | Admitting: Nurse Practitioner

## 2024-09-05 ENCOUNTER — Emergency Department (HOSPITAL_COMMUNITY)
Admission: EM | Admit: 2024-09-05 | Discharge: 2024-09-05 | Discharge status: Against Medical Advice | Attending: Emergency Medicine | Admitting: Emergency Medicine

## 2024-09-05 ENCOUNTER — Other Ambulatory Visit: Payer: Self-pay | Admitting: Nurse Practitioner

## 2024-09-05 ENCOUNTER — Other Ambulatory Visit: Payer: Self-pay

## 2024-09-05 DIAGNOSIS — Z5321 Procedure and treatment not carried out due to patient leaving prior to being seen by health care provider: Secondary | ICD-10-CM | POA: Insufficient documentation

## 2024-09-05 DIAGNOSIS — R0602 Shortness of breath: Secondary | ICD-10-CM | POA: Diagnosis not present

## 2024-09-05 DIAGNOSIS — M79672 Pain in left foot: Secondary | ICD-10-CM | POA: Insufficient documentation

## 2024-09-05 DIAGNOSIS — R002 Palpitations: Secondary | ICD-10-CM | POA: Diagnosis not present

## 2024-09-05 DIAGNOSIS — M79604 Pain in right leg: Secondary | ICD-10-CM

## 2024-09-05 DIAGNOSIS — M79605 Pain in left leg: Secondary | ICD-10-CM | POA: Insufficient documentation

## 2024-09-05 DIAGNOSIS — R110 Nausea: Secondary | ICD-10-CM | POA: Diagnosis not present

## 2024-09-05 DIAGNOSIS — I82442 Acute embolism and thrombosis of left tibial vein: Secondary | ICD-10-CM

## 2024-09-05 DIAGNOSIS — R42 Dizziness and giddiness: Secondary | ICD-10-CM | POA: Insufficient documentation

## 2024-09-05 LAB — CBC WITH DIFFERENTIAL/PLATELET
Abs Immature Granulocytes: 0.01 K/uL (ref 0.00–0.07)
Basophils Absolute: 0 K/uL (ref 0.0–0.1)
Basophils Relative: 1 %
Eosinophils Absolute: 0.2 K/uL (ref 0.0–0.5)
Eosinophils Relative: 4 %
HCT: 36.7 % (ref 36.0–46.0)
Hemoglobin: 12.4 g/dL (ref 12.0–15.0)
Immature Granulocytes: 0 %
Lymphocytes Relative: 36 %
Lymphs Abs: 2.1 K/uL (ref 0.7–4.0)
MCH: 26.6 pg (ref 26.0–34.0)
MCHC: 33.8 g/dL (ref 30.0–36.0)
MCV: 78.8 fL — ABNORMAL LOW (ref 80.0–100.0)
Monocytes Absolute: 0.6 K/uL (ref 0.1–1.0)
Monocytes Relative: 10 %
Neutro Abs: 2.9 K/uL (ref 1.7–7.7)
Neutrophils Relative %: 49 %
Platelets: 240 K/uL (ref 150–400)
RBC: 4.66 MIL/uL (ref 3.87–5.11)
RDW: 14.6 % (ref 11.5–15.5)
WBC: 5.8 K/uL (ref 4.0–10.5)
nRBC: 0 % (ref 0.0–0.2)

## 2024-09-05 NOTE — ED Notes (Signed)
Pt called multiple times and no answer.

## 2024-09-05 NOTE — ED Triage Notes (Signed)
 PT arrives via POV. Pt reports pain in her left leg and foot since last night. Reports hx of DVT. Denies missing any doses of eliquis . She also reports for the past week she has been experiencing intermittent sob, palpitations, dizziness, and nausea.

## 2024-09-05 NOTE — ED Notes (Signed)
 Pt stated she was no longer waiting. Pt seen leaving the ED.

## 2024-09-10 ENCOUNTER — Encounter: Payer: Self-pay | Admitting: Nurse Practitioner

## 2024-09-13 ENCOUNTER — Other Ambulatory Visit: Payer: Self-pay | Admitting: Nurse Practitioner

## 2024-09-14 ENCOUNTER — Ambulatory Visit (INDEPENDENT_AMBULATORY_CARE_PROVIDER_SITE_OTHER)

## 2024-09-14 ENCOUNTER — Other Ambulatory Visit (HOSPITAL_COMMUNITY)
Admission: RE | Admit: 2024-09-14 | Discharge: 2024-09-14 | Disposition: A | Discharge status: Home / Self Care | Source: Ambulatory Visit | Attending: Urology | Admitting: Urology

## 2024-09-14 ENCOUNTER — Ambulatory Visit: Admitting: Obstetrics and Gynecology

## 2024-09-14 VITALS — BP 116/80 | HR 73 | Ht 61.0 in | Wt 162.0 lb

## 2024-09-14 DIAGNOSIS — R102 Pelvic and perineal pain unspecified side: Secondary | ICD-10-CM | POA: Diagnosis not present

## 2024-09-14 DIAGNOSIS — R1030 Lower abdominal pain, unspecified: Secondary | ICD-10-CM | POA: Insufficient documentation

## 2024-09-14 DIAGNOSIS — Z712 Person consulting for explanation of examination or test findings: Secondary | ICD-10-CM | POA: Diagnosis not present

## 2024-09-14 DIAGNOSIS — Z87440 Personal history of urinary (tract) infections: Secondary | ICD-10-CM | POA: Diagnosis not present

## 2024-09-14 LAB — URINALYSIS, W/ REFLEX TO CULTURE (INFECTION SUSPECTED)
Bacteria, UA: NONE SEEN
Bilirubin Urine: NEGATIVE
Glucose, UA: NEGATIVE mg/dL
Hgb urine dipstick: NEGATIVE
Ketones, ur: NEGATIVE mg/dL
Leukocytes,Ua: NEGATIVE
Nitrite: NEGATIVE
Protein, ur: NEGATIVE mg/dL
Specific Gravity, Urine: 1.004 — ABNORMAL LOW (ref 1.005–1.030)
pH: 6 (ref 5.0–8.0)

## 2024-09-14 NOTE — Progress Notes (Signed)
 "  Acute Office Visit  Subjective:    Patient ID: Mary Pratt, female    DOB: November 08, 1970, 54 y.o.   MRN: [id number]   HPI 54 y.o. presents today for Results (U/S) .She would like to make sure she does not have uterine or ovarian cancer. Had spotting last month after intercourse Reports lower back pain and cramping. Has appointment with GI and MRI of spine scheduled.  Patient's last menstrual period was 09/14/2022.      Component Value Date/Time   DIAGPAP  08/21/2024 1402    - Negative for intraepithelial lesion or malignancy (NILM)   DIAGPAP  01/13/2024 1405    - Benign reactive/reparative changes including parakeratosis   ADEQPAP  08/21/2024 1402    Satisfactory for evaluation; transformation zone component ABSENT.   ADEQPAP  01/13/2024 1405    Satisfactory for evaluation; transformation zone component ABSENT.    Review of Systems     Objective:    OBGyn Exam  BP 116/80 (BP Location: Left Arm, Patient Position: Sitting, Cuff Size: Normal)   Pulse 73   Ht 5' 1 (1.549 m)   Wt 162 lb (73.5 kg)   LMP 09/14/2022   SpO2 98%   BMI 30.61 kg/m  Wt Readings from Last 3 Encounters:  09/14/24 162 lb (73.5 kg)  09/04/24 161 lb (73 kg)  08/21/24 160 lb (72.6 kg)   Pap smear in 5/25       Component Value Date/Time   DIAGPAP  08/21/2024 1402    - Negative for intraepithelial lesion or malignancy (NILM)   DIAGPAP  01/13/2024 1405    - Benign reactive/reparative changes including parakeratosis   ADEQPAP  08/21/2024 1402    Satisfactory for evaluation; transformation zone component ABSENT.   ADEQPAP  01/13/2024 1405    Satisfactory for evaluation; transformation zone component ABSENT.   Past Medical History:  Diagnosis Date   Abnormal pap 05/2012   ASCUS + HPV, 11/14 LGSIL   Allergy     Anemia    Anxiety    Asthma    Cardiac defibrillator in place    Chronic systolic CHF (congestive heart failure) (HCC)    a. 02/2015 Echo: EF 20%, sev dil w/ diff HK, worse @ inf  base. Tiv AI, mild MR, mod dil LA, PASP .   Clotting disorder    DVT of lower extremity (deep venous thrombosis) (HCC)    Functional dyspepsia    early satiety    GERD with stricture    dilated 2010   HELICOBACTER PYLORI GASTRITIS 01/15/2009   dyspnea, ? reaction to Biaxin/amoxicillin  Pylera 01/15/09 - completed therapy      IBS (irritable bowel syndrome)    Iron deficiency    NICM (nonischemic cardiomyopathy) (HCC)    a. 02/2015 Cath: nl cors, EF 15%, mild PAH;  b. 02/2015 Enrolled in VEST trial.   Sickle cell trait    Situational depression    son died   Vitamin B 12 deficiency 11/2022   Vitamin D  deficiency    Past Surgical History:  Procedure Laterality Date   BIV ICD GENERATOR CHANGEOUT N/A 01/24/2021   Procedure: BIV ICD GENERATOR CHANGEOUT;  Surgeon: Fernande Elspeth BROCKS, MD;  Location: [facility] INVASIVE CV LAB;  Service: Cardiovascular;  Laterality: N/A;   CARDIAC CATHETERIZATION N/A 02/26/2015   Procedure: Right/Left Heart Cath and Coronary Angiography;  Surgeon: Debby DELENA Sor, MD;  Location: [facility] INVASIVE CV LAB;  Service: Cardiovascular;  Laterality: N/A;   COLPOSCOPY  09/08/2011   neg  EP IMPLANTABLE DEVICE N/A 10/14/2015   Procedure: BiV ICD Insertion CRT-D;  Surgeon: Elspeth JAYSON Sage, MD;  Location: [facility] INVASIVE CV LAB;  Service: Cardiovascular;  Laterality: N/A;   ESOPHAGOGASTRODUODENOSCOPY (EGD) WITH ESOPHAGEAL DILATION  11/30/2008   esophageal ring dilated 54 French, H. pylori gastritis   HARVEST BONE GRAFT  02/2023   TUBAL LIGATION  09/07/1989   WISDOM TOOTH EXTRACTION     WRIST SURGERY  2023   Social History   Socioeconomic History   Marital status: Single    Spouse name: Not on file   Number of children: 3   Years of education: Not on file   Highest education level: Not on file  Occupational History   Occupation: texturing operator    Employer: [employer]  Tobacco Use   Smoking status: Never    Passive exposure: Past   Smokeless tobacco: Never  Vaping Use    Vaping status: Never Used  Substance and Sexual Activity   Alcohol use: Not Currently   Drug use: No   Sexual activity: Yes    Partners: Male    Birth control/protection: Surgical, Post-menopausal    Comment: BTL  Other Topics Concern   Not on file  Social History Narrative   Single, 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   2 sons 1 daughter 1 son deceased   Drinks caffeine  up to a few a day, never smoker no drug use   Occ wine   Social Drivers of Health   Tobacco Use: Low Risk (09/10/2024)   Patient History    Smoking Tobacco Use: Never    Smokeless Tobacco Use: Never    Passive Exposure: Past  Financial Resource Strain: Not on file  Food Insecurity: No Food Insecurity (10/15/2023)   Hunger Vital Sign    Worried About Running Out of Food in the Last Year: Never true    Ran Out of Food in the Last Year: Never true  Transportation Needs: No Transportation Needs (10/15/2023)   PRAPARE - Administrator, Civil Service (Medical): No    Lack of Transportation (Non-Medical): No  Physical Activity: Not on file  Stress: Not on file  Social Connections: Not on file  Intimate Partner Violence: Not At Risk (09/06/2024)   Received from [facility]   HITS    Over the last 12 months how often did your partner physically hurt you?: Never    Over the last 12 months how often did your partner insult you or talk down to you?: Never    Over the last 12 months how often did your partner threaten you with physical harm?: Never    Over the last 12 months how often did your partner scream or curse at you?: Never  Depression (PHQ2-9): Low Risk (09/04/2024)   Depression (PHQ2-9)    PHQ-2 Score: 0  Alcohol Screen: Not on file  Housing: Unknown (09/07/2024)   Received from [facility]   Epic    Unable to Pay for Housing in the Last Year: Not on file    Number of Times Moved in the Last Year: Not on file    At any time in the past 12 months, were you  homeless or living in a shelter (including now)?: No  Utilities: Not At Risk (10/15/2023)   [facility] Utilities    Threatened with loss of utilities: No  Health Literacy: Not on file    PUS today 6.95cm uterus Normal size and shape No myometrial  masses Endometrial lining normal at 2.17mm Normal atrophic ovaries No adnexal masses  No free fluid Assessment & Plan:   Counseled on PUS results and are normal. Likely post coital spotting from atrophic vaginitis. Patient declines any vaginal estrogen creams Patient encouraged to follow with PCP to help determine cause of pain as well.  20 minutes spent on reviewing records, imaging,  and one on one patient time and counseling patient and documentation Dr. Glennon Almarie MARLA Glennon "

## 2024-09-17 ENCOUNTER — Other Ambulatory Visit: Payer: Self-pay | Admitting: Cardiology

## 2024-09-17 DIAGNOSIS — I5022 Chronic systolic (congestive) heart failure: Secondary | ICD-10-CM

## 2024-09-17 NOTE — Progress Notes (Unsigned)
" °  Electrophysiology Office Note:    Date:  09/17/2024   ID:  [name], DOB March 30, 1971, MRN [id number]  PCP:  Toribio Jerel MATSU, MD   [facility] Providers Cardiologist:  Alvan Carrier, MD { Click to update primary MD,subspecialty MD or APP then REFRESH:1}    Referring MD: Toribio Jerel MATSU, MD   History of Present Illness:    Mary Pratt is a 54 y.o. female with a medical history significant for chronic systolic heart failure, Medtronic CRT ICD, nonischemic cardiomyopathy, hypertension, sickle cell trait, referred for device management.     She was diagnosed with nonischemic Cardiomyopathy; she underwent coronary angiogram in 2016 revealing no coronary artery disease.  CRT-D was planted in 2017 and EF subsequently returned to normal.  On recent device check she was noticed to have a new under sensing of P waves as well as far field R wave sensing.  Discussed the use of AI scribe software for clinical note transcription with the patient, who gave verbal consent to proceed.  History of Present Illness          Today, ***  EKGs/Labs/Other Studies Reviewed Today:     Echocardiogram:  TTE February 2025 LVEF 55 to 60%.  Mild LVH.     Advanced imaging:  CT coronary Coronary calcium score 0.  Minimal noncalcified plaque in the LAD/LCx  Cardiac catherization  2016 No CAD  EKG:         Physical Exam:    VS:  LMP 09/14/2022     Wt Readings from Last 3 Encounters:  09/14/24 162 lb (73.5 kg)  09/04/24 161 lb (73 kg)  08/21/24 160 lb (72.6 kg)     GEN: *** Well nourished, well developed in no acute distress CARDIAC: ***RRR, no murmurs, rubs, gallops RESPIRATORY:  Normal work of breathing MUSCULOSKELETAL: *** edema    ASSESSMENT & PLAN:     *** ***  *** ***  *** ***  *** ***  *** ***    Signed, Eulas FORBES Furbish, MD  09/17/2024 11:53 AM    [facility] "

## 2024-09-18 ENCOUNTER — Encounter: Payer: Self-pay | Admitting: Obstetrics and Gynecology

## 2024-09-18 ENCOUNTER — Ambulatory Visit: Admitting: Cardiovascular Disease

## 2024-09-19 ENCOUNTER — Ambulatory Visit: Admitting: Dermatology

## 2024-09-19 ENCOUNTER — Telehealth: Payer: Self-pay

## 2024-09-19 ENCOUNTER — Other Ambulatory Visit: Payer: Self-pay

## 2024-09-19 ENCOUNTER — Encounter: Payer: Self-pay | Admitting: Dermatology

## 2024-09-19 DIAGNOSIS — D1801 Hemangioma of skin and subcutaneous tissue: Secondary | ICD-10-CM

## 2024-09-19 DIAGNOSIS — W908XXA Exposure to other nonionizing radiation, initial encounter: Secondary | ICD-10-CM

## 2024-09-19 DIAGNOSIS — L661 Lichen planopilaris, unspecified: Secondary | ICD-10-CM

## 2024-09-19 DIAGNOSIS — L821 Other seborrheic keratosis: Secondary | ICD-10-CM

## 2024-09-19 DIAGNOSIS — L578 Other skin changes due to chronic exposure to nonionizing radiation: Secondary | ICD-10-CM

## 2024-09-19 DIAGNOSIS — Z1283 Encounter for screening for malignant neoplasm of skin: Secondary | ICD-10-CM

## 2024-09-19 DIAGNOSIS — D229 Melanocytic nevi, unspecified: Secondary | ICD-10-CM

## 2024-09-19 DIAGNOSIS — L814 Other melanin hyperpigmentation: Secondary | ICD-10-CM

## 2024-09-19 MED ORDER — CLOBETASOL PROPIONATE 0.05 % EX SOLN: 1.0000 | Freq: Two times a day (BID) | CUTANEOUS | 5 refills | Status: AC

## 2024-09-19 NOTE — Telephone Encounter (Signed)
 Pt called inquiring if there is a test that be done on her to r/o blood cancer d/t a rash she has.  Pt stated she's experiencing a rash and itching on her skin as well as redness on her scalp.  Asked pt if she's ever been seen by a Dermatologist regarding her new symptoms.  Pt stated No.  Recommended that pt f/u w/a dermatologist regarding the rash she's experiencing.  Pt verbalized understanding and stated she would contact her PCP or a dermatologist close to her home.  Pt had no further questions or concerns at this time.  Notified Dr Lanny of the pt's call.

## 2024-09-19 NOTE — Patient Instructions (Addendum)
 VISIT SUMMARY:  Mary Pratt, a 54 year old female with a history of lichen sclerosus and lichen planopilaris, visited for a skin cancer screening and management of scalp symptoms. She experiences a burning sensation on her scalp, particularly at the top and back, and notes that her hair does not grow in the back and remains dry. She was previously prescribed clobetasol  for her scalp, which helped alleviate the burning sensation when applied.  YOUR PLAN:  -LICHEN PLANOPILARIS:  Lichen planopilaris is a condition that causes inflammation and redness around hair follicles, leading to symptoms like burning sensations and potential hair loss. You have been prescribed clobetasol  liquid drops for your scalp.   Please apply the drops daily if you experience itching or burning, otherwise every other day. Continue this treatment consistently for six months to control inflammation and prevent scarring. We will reassess your scalp condition and adjust the treatment as needed during your follow-up appointment in May.  INSTRUCTIONS:  Please schedule a follow-up appointment in May to assess your scalp condition and adjust the treatment as needed.    Important Information  Due to recent changes in healthcare laws, you may see results of your pathology and/or laboratory studies on MyChart before the doctors have had a chance to review them. We understand that in some cases there may be results that are confusing or concerning to you. Please understand that not all results are received at the same time and often the doctors may need to interpret multiple results in order to provide you with the best plan of care or course of treatment. Therefore, we ask that you please give us  2 business days to thoroughly review all your results before contacting the office for clarification. Should we see a critical lab result, you will be contacted sooner.   If You Need Anything After Your Visit  If you have any  questions or concerns for your doctor, please call our main line at [phone] If no one answers, please leave a voicemail as directed and we will return your call as soon as possible. Messages left after 4 pm will be answered the following business day.   You may also send us  a message via MyChart. We typically respond to MyChart messages within 1-2 business days.  For prescription refills, please ask your pharmacy to contact our office. Our fax number is [phone].  If you have an urgent issue when the clinic is closed that cannot wait until the next business day, you can page your doctor at the number below.    Please note that while we do our best to be available for urgent issues outside of office hours, we are not available 24/7.   If you have an urgent issue and are unable to reach us , you may choose to seek medical care at your doctor's office, retail clinic, urgent care center, or emergency room.  If you have a medical emergency, please immediately call 911 or go to the emergency department. In the event of inclement weather, please call our main line at [phone] for an update on the status of any delays or closures.  Dermatology Medication Tips: Please keep the boxes that topical medications come in in order to help keep track of the instructions about where and how to use these. Pharmacies typically print the medication instructions only on the boxes and not directly on the medication tubes.   If your medication is too expensive, please contact our office at [phone] or send us  a message through MyChart.  We are unable to tell what your co-pay for medications will be in advance as this is different depending on your insurance coverage. However, we may be able to find a substitute medication at lower cost or fill out paperwork to get insurance to cover a needed medication.   If a prior authorization is required to get your medication covered by your insurance company,  please allow us  1-2 business days to complete this process.  Drug prices often vary depending on where the prescription is filled and some pharmacies may offer cheaper prices.  The website www.goodrx.com contains coupons for medications through different pharmacies. The prices here do not account for what the cost may be with help from insurance (it may be cheaper with your insurance), but the website can give you the price if you did not use any insurance.  - You can print the associated coupon and take it with your prescription to the pharmacy.  - You may also stop by our office during regular business hours and pick up a GoodRx coupon card.  - If you need your prescription sent electronically to a different pharmacy, notify our office through [facility] or by phone at [phone]

## 2024-09-19 NOTE — Progress Notes (Signed)
" ° °  New Patient Visit   Patient (and/or pt guardian) consented to the use of AI-assisted tools for note generation.  Subjective  Mary Pratt is a 54 y.o. female who presents for the following: Referral for TBSE and Lichen Sclerosis  Patient present today for new patient visit for TBSE. The patient reports she has spots, moles and lesions to be evaluated, some may be new or changing and the patient may have concern these could be cancer.  Patient previously been treated by a dermatologist. Patient reports she does not have hx of bx.  Patient denies family history of skin cancers.  Patient reports throughout her lifetime has had moderate sun exposure. Currently, patient reports if she has excessive sun exposure, she does apply sunscreen and/or wears protective coverings.  Patient would like to discuss Lichen Sclerosis on labia Patient would like to discuss lichen planopilaris on scalp. Patient was previously  prescribed clobetasol  but rarely uses it and says that it does help some when she does.    The following portions of the chart were reviewed this encounter and updated as appropriate: medications, allergies, medical history  Review of Systems:  No other skin or systemic complaints except as noted in HPI or Assessment and Plan.  Objective  Well appearing patient in no apparent distress; mood and affect are within normal limits.  A full examination was performed including scalp, head, eyes, ears, nose, lips, neck, chest, axillae, abdomen, back, buttocks, bilateral upper extremities, bilateral lower extremities, hands, feet, fingers, toes, fingernails, and toenails. All findings within normal limits unless otherwise noted below.     Relevant exam findings are noted in the Assessment and Plan.   Assessment & Plan   LENTIGINES, SEBORRHEIC KERATOSES, HEMANGIOMAS - Benign normal skin lesions - Benign-appearing - Call for any changes  MELANOCYTIC NEVI - Tan-brown and/or  pink-flesh-colored symmetric macules and papules - Benign appearing on exam today - Observation - Call clinic for new or changing moles - Recommend daily use of broad spectrum spf 30+ sunscreen to sun-exposed areas.   ACTINIC DAMAGE - Chronic condition, secondary to cumulative UV/sun exposure - diffuse scaly erythematous macules with underlying dyspigmentation - Recommend daily broad spectrum sunscreen SPF 30+ to sun-exposed areas, reapply every 2 hours as needed.  - Staying in the shade or wearing long sleeves, sun glasses (UVA+UVB protection) and wide brim hats (4-inch brim around the entire circumference of the hat) are also recommended for sun protection.  - Call for new or changing lesions.  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 Previous biopsy confirmed diagnosis. Current symptoms include perifollicular erythema without clinical scarring, indicating early stage. Previous treatment with clobetasol  cream was not consistently applied. Current examination shows some redness around hair follicles, suggesting inflammation may be increasing. - Prescribed clobetasol  liquid drops for scalp application. - Instructed to apply clobetasol  drops daily if experiencing itching or burning, otherwise every other day. - Continue treatment consistently for six months to control inflammation and prevent scarring. - Scheduled follow-up appointment in May to assess scalp condition and adjust treatment as needed.  SKIN CANCER SCREENING PERFORMED TODAY  LICHEN PLANOPILARIS    Return in 2 years (on 09/19/2026) for TBSE f/u.  [name], am acting as a neurosurgeon for [employer], DO .  Documentation: I have reviewed the above documentation for accuracy and completeness, and I agree with the above.  Delon Lenis, DO   "

## 2024-09-20 ENCOUNTER — Ambulatory Visit: Attending: Nurse Practitioner

## 2024-09-20 DIAGNOSIS — M79605 Pain in left leg: Secondary | ICD-10-CM

## 2024-09-20 DIAGNOSIS — M79604 Pain in right leg: Secondary | ICD-10-CM | POA: Diagnosis not present

## 2024-09-20 DIAGNOSIS — I82442 Acute embolism and thrombosis of left tibial vein: Secondary | ICD-10-CM

## 2024-09-21 ENCOUNTER — Encounter: Payer: Self-pay | Admitting: Nurse Practitioner

## 2024-09-27 ENCOUNTER — Emergency Department (HOSPITAL_COMMUNITY)

## 2024-09-27 ENCOUNTER — Other Ambulatory Visit: Payer: Self-pay

## 2024-09-27 ENCOUNTER — Encounter (HOSPITAL_COMMUNITY): Payer: Self-pay | Admitting: *Deleted

## 2024-09-27 ENCOUNTER — Telehealth: Payer: Self-pay

## 2024-09-27 ENCOUNTER — Emergency Department (HOSPITAL_COMMUNITY)
Admission: EM | Admit: 2024-09-27 | Discharge: 2024-09-27 | Disposition: A | Discharge status: Home / Self Care | Attending: Emergency Medicine | Admitting: Emergency Medicine

## 2024-09-27 ENCOUNTER — Encounter: Payer: Self-pay | Admitting: Nurse Practitioner

## 2024-09-27 DIAGNOSIS — M542 Cervicalgia: Secondary | ICD-10-CM | POA: Insufficient documentation

## 2024-09-27 DIAGNOSIS — Z79899 Other long term (current) drug therapy: Secondary | ICD-10-CM | POA: Diagnosis not present

## 2024-09-27 DIAGNOSIS — Z7951 Long term (current) use of inhaled steroids: Secondary | ICD-10-CM | POA: Diagnosis not present

## 2024-09-27 DIAGNOSIS — R42 Dizziness and giddiness: Secondary | ICD-10-CM | POA: Insufficient documentation

## 2024-09-27 DIAGNOSIS — Z7901 Long term (current) use of anticoagulants: Secondary | ICD-10-CM | POA: Insufficient documentation

## 2024-09-27 DIAGNOSIS — J45909 Unspecified asthma, uncomplicated: Secondary | ICD-10-CM | POA: Diagnosis not present

## 2024-09-27 DIAGNOSIS — I10 Essential (primary) hypertension: Secondary | ICD-10-CM | POA: Insufficient documentation

## 2024-09-27 DIAGNOSIS — R519 Headache, unspecified: Secondary | ICD-10-CM | POA: Insufficient documentation

## 2024-09-27 DIAGNOSIS — R0789 Other chest pain: Secondary | ICD-10-CM | POA: Diagnosis present

## 2024-09-27 LAB — CBC WITH DIFFERENTIAL/PLATELET
Abs Immature Granulocytes: 0.01 K/uL (ref 0.00–0.07)
Basophils Absolute: 0 K/uL (ref 0.0–0.1)
Basophils Relative: 1 %
Eosinophils Absolute: 0.2 K/uL (ref 0.0–0.5)
Eosinophils Relative: 4 %
HCT: 39.7 % (ref 36.0–46.0)
Hemoglobin: 12.9 g/dL (ref 12.0–15.0)
Immature Granulocytes: 0 %
Lymphocytes Relative: 38 %
Lymphs Abs: 2 K/uL (ref 0.7–4.0)
MCH: 26.2 pg (ref 26.0–34.0)
MCHC: 32.5 g/dL (ref 30.0–36.0)
MCV: 80.7 fL (ref 80.0–100.0)
Monocytes Absolute: 0.4 K/uL (ref 0.1–1.0)
Monocytes Relative: 7 %
Neutro Abs: 2.6 K/uL (ref 1.7–7.7)
Neutrophils Relative %: 50 %
Platelets: 223 K/uL (ref 150–400)
RBC: 4.92 MIL/uL (ref 3.87–5.11)
RDW: 14.5 % (ref 11.5–15.5)
WBC: 5.3 K/uL (ref 4.0–10.5)
nRBC: 0 % (ref 0.0–0.2)

## 2024-09-27 LAB — COMPREHENSIVE METABOLIC PANEL WITH GFR
ALT: 16 U/L (ref 0–44)
AST: 22 U/L (ref 15–41)
Albumin: 4.5 g/dL (ref 3.5–5.0)
Alkaline Phosphatase: 101 U/L (ref 38–126)
Anion gap: 14 (ref 5–15)
BUN: 12 mg/dL (ref 6–20)
CO2: 24 mmol/L (ref 22–32)
Calcium: 9.9 mg/dL (ref 8.9–10.3)
Chloride: 101 mmol/L (ref 98–111)
Creatinine, Ser: 0.94 mg/dL (ref 0.44–1.00)
GFR, Estimated: >60 mL/min
Glucose, Bld: 109 mg/dL — ABNORMAL HIGH (ref 70–99)
Potassium: 4 mmol/L (ref 3.5–5.1)
Sodium: 139 mmol/L (ref 135–145)
Total Bilirubin: 0.6 mg/dL (ref 0.0–1.2)
Total Protein: 8 g/dL (ref 6.5–8.1)

## 2024-09-27 LAB — RESP PANEL BY RT-PCR (RSV, FLU A&B, COVID)  RVPGX2
Influenza A by PCR: NEGATIVE
Influenza B by PCR: NEGATIVE
Resp Syncytial Virus by PCR: NEGATIVE
SARS Coronavirus 2 by RT PCR: NEGATIVE

## 2024-09-27 LAB — TROPONIN T, HIGH SENSITIVITY: Troponin T High Sensitivity: <6 ng/L (ref 0–19)

## 2024-09-27 MED ORDER — PREDNISONE 20 MG PO TABS: 40.0000 mg | ORAL_TABLET | Freq: Every day | ORAL | 0 refills | Status: AC

## 2024-09-27 MED ORDER — ACETAMINOPHEN 500 MG PO TABS
1000.0000 mg | ORAL_TABLET | Freq: Once | ORAL | Status: AC
  Administered 2024-09-27: 1000 mg via ORAL
  Filled 2024-09-27: qty 2

## 2024-09-27 NOTE — ED Provider Notes (Signed)
 " [place] EMERGENCY DEPARTMENT AT [facility] Provider Note   CSN: [id number] Arrival date & time: 09/27/24  1411     Patient presents with: scalp pain and Chest Pain   Mary Pratt is a 54 y.o. female.  Patient is a 54 year old female with a history of heart failure, asthma, GERD, hypertension, ICD in place, previous DVT on Eliquis  who presents to the ED intermittent head pain and chest tightness for the past 4 days.  Notes her scalp feels very tender and is worse on the backside.  States radiates down through the neck.  Notes she does have some bulging disc disease but denies new fall or injury.  She states my brain hurts but states it is not headache.  Denies dizziness, vision changes, syncope, numbness/tingling/weakness.  Patient also notes that she has had intermittent chest tightness for approximately 4 days.  States this is described as pressure.  She has not noticed anything that brings on the episodes.  Pain does not radiate.  She is currently not having any pain.  Denies shortness of breath.  She states she has been compliant with all medications.  She states she has had some chills and bodyaches.  No further complaints.    Chest Pain Associated symptoms: headache   Associated symptoms: no back pain, no cough, no dizziness, no fever, no nausea, no shortness of breath, no vomiting and no weakness        Prior to Admission medications  Medication Sig Start Date End Date Taking? Authorizing Provider  predniSONE  (DELTASONE ) 20 MG tablet Take 2 tablets (40 mg total) by mouth daily for 5 days. 09/27/24 10/02/24 Yes Hamza Empson, Thersia RAMAN, PA-C  acetaminophen  (TYLENOL ) 500 MG tablet Take 1,000 mg by mouth every 6 (six) hours as needed.    [provider]  albuterol  (VENTOLIN  HFA) 108 (90 Base) MCG/ACT inhaler Inhale 1-2 puffs into the lungs every 4 (four) hours as needed. 01/07/24   Cari Arlean HERO, FNP  amitriptyline  (ELAVIL ) 25 MG tablet TAKE 1 TABLET BY MOUTH EVERYDAY  AT BEDTIME 03/20/24   Avram Lupita BRAVO, MD  amLODipine  (NORVASC ) 2.5 MG tablet TAKE 1 TABLET BY MOUTH EVERY DAY 09/13/24   Miriam Norris, NP  apixaban  (ELIQUIS ) 5 MG TABS tablet Take 1 tablet (5 mg total) by mouth 2 (two) times daily. Start taking after completion of starter pack. 11/01/23   Barbarann Dixon B, RPH-CPP  Azelastine  HCl 137 MCG/SPRAY SOLN Place 2 sprays into both nostrils 2 (two) times daily. 11/16/23   [provider]  carvedilol  (COREG ) 25 MG tablet Take 0.5 tablets (12.5 mg total) by mouth 2 (two) times daily with a meal. 05/15/24   Miriam Norris, NP  clobetasol  (TEMOVATE ) 0.05 % external solution Apply 1 Application topically 2 (two) times daily. 09/19/24   Alm Delon SAILOR, DO  clobetasol  ointment (TEMOVATE ) 0.05 % Apply 1 Application topically See admin instructions. Apply to area with itching 3 times a day for a week then twice a day for 2 weeks then daily for 1 week then twice weekly for a week.  To use with only flares 02/17/24   Glennon Norris POUR, MD  conjugated estrogens  (PREMARIN ) vaginal cream Place 1 Applicatorful vaginally 3 (three) times a week. 03/01/24   Glennon Norris POUR, MD  Cyanocobalamin  (VITAMIN B-12 IJ) Inject as directed every 30 (thirty) days.    [provider]  EPINEPHRINE  0.3 mg/0.3 mL IJ SOAJ injection INJECT 0.3 MG INTO THE MUSCLE AS NEEDED FOR ANAPHYLAXIS. 01/12/24  Cari Arlean HERO, FNP  fexofenadine (ALLEGRA) 180 MG tablet Take 180 mg by mouth daily. 01/13/24   [provider]  fluticasone  (FLONASE ) 50 MCG/ACT nasal spray Place 2 sprays into both nostrils every morning. 01/07/24   Cari Arlean HERO, FNP  fluticasone -salmeterol (ADVAIR DISKUS) 250-50 MCG/ACT AEPB Inhale 1 puff into the lungs in the morning. 05/05/24   Ambs, Arlean HERO, FNP  furosemide  (LASIX ) 40 MG tablet Take 1 tablet (40 mg total) by mouth daily. 01/17/24   Miriam Norris, NP  gabapentin (NEURONTIN) 100 MG capsule Take 1 tablet by mouth at night as tolerated 02/09/24   [provider]  Galcanezumab -gnlm (EMGALITY ) 120 MG/ML SOAJ Inject 120 mg into the skin every 28 (twenty-eight) days. 05/18/24   Skeet, Adam R, DO  lidocaine  (LIDODERM ) 5 % Place 1 patch onto the skin daily. Remove & Discard patch within 12 hours or as directed by MD Patient not taking: Reported on 09/19/2024 07/29/24   Bero, Michael M, MD  methocarbamol  (ROBAXIN ) 500 MG tablet Take 1 tablet (500 mg total) by mouth every 8 (eight) hours as needed for muscle spasms. Patient not taking: Reported on 09/19/2024 07/29/24   Bero, Michael M, MD  nitroGLYCERIN  (NITROSTAT ) 0.4 MG SL tablet Place 1 tablet (0.4 mg total) under the tongue every 5 (five) minutes x 3 doses as needed for chest pain. 05/15/24   Miriam Norris, NP  pantoprazole  (PROTONIX ) 40 MG tablet Take 40 mg by mouth daily.    [provider]  sacubitril -valsartan  (ENTRESTO ) 24-26 MG TAKE 1 TABLET BY MOUTH TWICE A DAY 01/13/24   Alvan Dorn FALCON, MD  sertraline  (ZOLOFT ) 100 MG tablet Take 100 mg by mouth daily. 01/24/20   [provider]  spironolactone  (ALDACTONE ) 25 MG tablet TAKE 1 TABLET (25 MG TOTAL) BY MOUTH DAILY. 09/18/24   Alvan Dorn FALCON, MD  traMADol  (ULTRAM ) 50 MG tablet Take 1 tablet (50 mg total) by mouth every 6 (six) hours as needed. 07/19/24   Steinl, Kevin, MD  Vitamin D , Ergocalciferol , (DRISDOL) 1.25 MG (50000 UNIT) CAPS capsule Take 50,000 Units by mouth once a week. 10/26/23   [provider]    Allergies: Clarithromycin, Amoxicillin, Meloxicam, Spiriva respimat [tiotropium bromide], and Symbicort  [budesonide -formoterol  fumarate]    Review of Systems  Constitutional:  Positive for chills. Negative for fever.  Respiratory:  Negative for cough, chest tightness and shortness of breath.   Cardiovascular:  Positive for chest pain.  Gastrointestinal:  Negative for nausea and vomiting.  Musculoskeletal:  Positive for myalgias. Negative for back pain.  Neurological:  Positive for headaches. Negative for  dizziness, syncope and weakness.  All other systems reviewed and are negative.   Updated Vital Signs BP 108/73   Pulse 75   Temp 97.6 F (36.4 C)   Resp 15   Ht 5' 1 (1.549 m)   Wt 73.5 kg   LMP 09/14/2022   SpO2 95%   BMI 30.62 kg/m   Physical Exam Constitutional:      Appearance: She is well-developed.  HENT:     Head: Normocephalic and atraumatic.     Comments: Tender palpation on the posterior scalp.  No deformities, erythema, edema noted.  No wounds. Neck:     Comments: Full range of motion of the neck.  Mildly tender palpation of the upper C-spine.  No crepitus or stiffness appreciated. Cardiovascular:     Rate and Rhythm: Normal rate.  Pulmonary:     Effort: Pulmonary effort is normal.  Breath sounds: Normal breath sounds.  Musculoskeletal:        General: Normal range of motion.     Cervical back: Normal range of motion and neck supple.  Skin:    General: Skin is warm and dry.  Neurological:     Mental Status: She is alert and oriented to person, place, and time.     (all labs ordered are listed, but only abnormal results are displayed) Labs Reviewed  COMPREHENSIVE METABOLIC PANEL WITH GFR - Abnormal; Notable for the following components:      Result Value   Glucose, Bld 109 (*)    All other components within normal limits  RESP PANEL BY RT-PCR (RSV, FLU A&B, COVID)  RVPGX2  CBC WITH DIFFERENTIAL/PLATELET  TROPONIN T, HIGH SENSITIVITY    EKG: None  Radiology: CT Cervical Spine Wo Contrast Result Date: 09/27/2024 CLINICAL DATA:  Pain to her scalp for a week with chest pressure 4 days ago and dizziness. EXAM: CT CERVICAL SPINE WITHOUT CONTRAST TECHNIQUE: Multidetector CT imaging of the cervical spine was performed without intravenous contrast. Multiplanar CT image reconstructions were also generated. RADIATION DOSE REDUCTION: This exam was performed according to the departmental dose-optimization program which includes automated exposure control,  adjustment of the mA and/or kV according to patient size and/or use of iterative reconstruction technique. COMPARISON:  None Available. FINDINGS: Alignment: Normal. Skull base and vertebrae: No acute fracture. No primary bone lesion or focal pathologic process. Soft tissues and spinal canal: No prevertebral fluid or swelling. No visible canal hematoma. Disc levels: Moderate severity endplate sclerosis, mild to moderate severity anterior osteophyte formation and mild posterior bony spurring are seen at the levels of C3-C4, C4-C5 and C5-C6. Moderate severity intervertebral disc space narrowing is present at C3-C4, C4-C5 and C5-C6. Mild, bilateral multilevel facet joint hypertrophy is noted. Upper chest: Negative. Other: None. IMPRESSION: 1. Moderate severity degenerative changes at the levels of C3-C4, C4-C5 and C5-C6. 2. No acute cervical spine fracture or subluxation. Electronically Signed   By: Suzen Dials M.D.   On: 09/27/2024 16:19   CT Head Wo Contrast Result Date: 09/27/2024 CLINICAL DATA:  Dizziness and scalp pain for week. EXAM: CT HEAD WITHOUT CONTRAST TECHNIQUE: Contiguous axial images were obtained from the base of the skull through the vertex without intravenous contrast. RADIATION DOSE REDUCTION: This exam was performed according to the departmental dose-optimization program which includes automated exposure control, adjustment of the mA and/or kV according to patient size and/or use of iterative reconstruction technique. COMPARISON:  July 19, 2024 FINDINGS: Brain: No evidence of acute infarction, hemorrhage, hydrocephalus, extra-axial collection or mass lesion/mass effect. Vascular: No hyperdense vessel or unexpected calcification. Skull: Normal. Negative for fracture or focal lesion. Sinuses/Orbits: No acute finding. Other: None. IMPRESSION: No acute intracranial pathology. Electronically Signed   By: Suzen Dials M.D.   On: 09/27/2024 16:17   DG Chest Portable 1 View Result Date:  09/27/2024 CLINICAL DATA:  Chest pressure. EXAM: PORTABLE CHEST 1 VIEW COMPARISON:  04/29/2024 FINDINGS: Left-sided pacemaker unchanged. Lungs are adequately inflated without focal airspace consolidation or effusion. Cardiomediastinal silhouette and remainder of the exam is unchanged. IMPRESSION: No active disease. Electronically Signed   By: Toribio Agreste M.D.   On: 09/27/2024 14:54      Medications Ordered in the ED  acetaminophen  (TYLENOL ) tablet 1,000 mg (1,000 mg Oral Given 09/27/24 1459)  Medical Decision Making Patient is a 54 year old female who presents to the ED for intermittent pain to the back of the head, neck pain, and chest pressure for the past 4 days.  Please see detailed HPI above.  On exam patient is alert and in no acute distress.  Physical exam as noted above.  No acute neurologic deficits and vital signs stable.  Differential includes acute viral illness, migraine headache, tension headache, cervical radiculopathy, degenerative disc disease, ACS.  Lab workup reassuring including negative viral panel and negative troponin.  No acute electrolyte abnormalities.  CT of the head is unremarkable.  CT of the neck shows moderate severity degenerative changes but no acute fracture or subluxation.  Patient noted to have cervical disc disease.  Chest x-ray reviewed and negative for acute abnormality.  EKG shows sinus rhythm.  Patient was given Tylenol  while in ED and states she is feeling better.  Suspect patient's pain and symptoms secondary to acute on chronic neck pain causing tension type headache.  She is otherwise well-appearing and vital signs stable.  Stable for discharge home.  Prescribed prednisone  for a few days.  Symptomatic care discussed.  Vies PCP follow-up in 1 week for any continued symptoms.  Return precautions provided.  Amount and/or Complexity of Data Reviewed Labs: ordered. Radiology: ordered.  Risk OTC drugs. Prescription drug  management.       Final diagnoses:  Atypical chest pain  Neck pain  Pain of scalp    ED Discharge Orders          Ordered    predniSONE  (DELTASONE ) 20 MG tablet  Daily        09/27/24 1708               Neysa Thersia RAMAN, [place] 09/27/24 1730    Garrick Charleston, MD 09/27/24 2238  "

## 2024-09-27 NOTE — Discharge Instructions (Signed)
 Begin taking prednisone  as prescribed.  May take Tylenol  every 6 hours as needed for pain.  Apply heat pads to the neck to help with pain and tension as well.  Follow-up with primary care doctor in 1 week for any continued symptoms.  Return to ED if any symptoms worsen including severe headache, uncontrollable nausea/vomiting, numbness/tingling/weakness, severe chest pain or shortness of breath.

## 2024-09-27 NOTE — ED Triage Notes (Signed)
 Pt c/o pain to her scalp for a week. + chest pressure 4 days ago. + dizziness

## 2024-09-27 NOTE — Telephone Encounter (Signed)
 Pt called stating that her scalp is still inflamed and skin itching.  Stated did the pt f/u with her PCP or Dermatologist as previously recommended by Dr Lanny.  Pt stated Yes but did not go into detail regarding what was said during that visit with the Dermatologist.  Stated this nurse will make Dr Lanny and her team aware of the pt's call.

## 2024-09-28 NOTE — CV Procedure (Signed)
" °  Device system confirmed to be MRI conditional, with implant date > 6 weeks ago, and no evidence of abandoned or epicardial leads in review of most recent CXR  Device last cleared by EP Provider: Daphne Barrack 09/28/2024  Clearance is good through for 1 year as long as parameters remain stable at time of check. If pt undergoes a cardiac device procedure during that time, they should be re-cleared.   Tachy-therapies to be programmed off if applicable with device back to pre-MRI settings after completion of exam.  [employer] - Programming recommendation received through [employer] App/Tablet  Rocky Catalan, RT  09/28/2024 4:08 PM     "

## 2024-09-28 NOTE — Progress Notes (Unsigned)
" ° °  [address] [place] [address] Dept: [phone]  FOLLOW UP NOTE  Patient ID: Mary Pratt, female    DOB: Aug 20, 1971  Age: 54 y.o. MRN: [id number] Date of Office Visit: 09/29/2024  Assessment  Chief Complaint: No chief complaint on file.  HPI Mary Pratt is a 54 year old female who presents to the clinic for follow-up visit.  She was last seen in this clinic on 01/07/2024 by Arlean Mutter, FNP, for evaluation of asthma and allergic rhinitis.  She began allergen immunotherapy directed toward grass pollen, weed pollen, dog, dust mite, cockroach, and mold on 04/26/2023 with her last injection on 01/28/2024.  Discussed the use of AI scribe software for clinical note transcription with the patient, who gave verbal consent to proceed.  History of Present Illness      Drug Allergies:  Allergies[1]  Physical Exam: LMP 09/14/2022    Physical Exam  Diagnostics:    Assessment and Plan: No diagnosis found.  No orders of the defined types were placed in this encounter.   There are no Patient Instructions on file for this visit.  No follow-ups on file.    Thank you for the opportunity to care for this patient.  Please do not hesitate to contact me with questions.  Arlean Mutter, FNP [facility]          [1]  Allergies Allergen Reactions   Clarithromycin Hives    Breaks out and gets whelps    clarithromycin   Amoxicillin Palpitations    REACTION: Tacycardia   Meloxicam Rash   Spiriva Respimat [Tiotropium Bromide] Rash   Symbicort  [Budesonide -Formoterol  Fumarate] Rash   "

## 2024-09-28 NOTE — Patient Instructions (Incomplete)
 Continue Advair 250-1 puff once a day to prevent cough or wheeze.  Continue albuterol  2 puffs once every 4 hours if needed for cough or wheeze You may use albuterol  2 puffs 5 to 15 minutes before activity to decrease cough or wheeze  Allergic rhinitis Continue allergen avoidance measures directed toward grass pollen, weed pollen, dog, dust mite, cockroach, and mold as listed below Continue an antihistamine once a day if needed for runny nose or itch Continue Flonase  2 sprays each nostril once a day if needed for stuffy nose. In the right nostril, point the applicator out toward the right ear. In the left nostril, point the applicator out toward the left ear Consider saline nasal rinses as needed for nasal symptoms. Use this before any medicated nasal sprays for best result Restart allergen immunotherapy and have access to an epinephrine  autoinjector set per protocol  Call the clinic if this treatment plan is not working well for you.  Follow up in 6 months or sooner if needed.  Reducing Pollen Exposure The American Academy of Allergy , Asthma and Immunology suggests the following steps to reduce your exposure to pollen during allergy  seasons. Do not hang sheets or clothing out to dry; pollen may collect on these items. Do not mow lawns or spend time around freshly cut grass; mowing stirs up pollen. Keep windows closed at night.  Keep car windows closed while driving. Minimize morning activities outdoors, a time when pollen counts are usually at their highest. Stay indoors as much as possible when pollen counts or humidity is high and on windy days when pollen tends to remain in the air longer. Use air conditioning when possible.  Many air conditioners have filters that trap the pollen spores. Use a HEPA room air filter to remove pollen form the indoor air you breathe.  Control of Dog or Cat Allergen Avoidance is the best way to manage a dog or cat allergy . If you have a dog or cat and are  allergic to dog or cats, consider removing the dog or cat from the home. If you have a dog or cat but dont want to find it a new home, or if your family wants a pet even though someone in the household is allergic, here are some strategies that may help keep symptoms at bay:  Keep the pet out of your bedroom and restrict it to only a few rooms. Be advised that keeping the dog or cat in only one room will not limit the allergens to that room. Dont pet, hug or kiss the dog or cat; if you do, wash your hands with soap and water. High-efficiency particulate air (HEPA) cleaners run continuously in a bedroom or living room can reduce allergen levels over time. Regular use of a high-efficiency vacuum cleaner or a central vacuum can reduce allergen levels. Giving your dog or cat a bath at least once a week can reduce airborne allergen.  Control of Mold Allergen Mold and fungi can grow on a variety of surfaces provided certain temperature and moisture conditions exist.  Outdoor molds grow on plants, decaying vegetation and soil.  The major outdoor mold, Alternaria and Cladosporium, are found in very high numbers during hot and dry conditions.  Generally, a late Summer - Fall peak is seen for common outdoor fungal spores.  Rain will temporarily lower outdoor mold spore count, but counts rise rapidly when the rainy period ends.  The most important indoor molds are Aspergillus and Penicillium.  Dark, humid and poorly  ventilated basements are ideal sites for mold growth.  The next most common sites of mold growth are the bathroom and the kitchen.  Outdoor [employer] Use air conditioning and keep windows closed Avoid exposure to decaying vegetation. Avoid leaf raking. Avoid grain handling. Consider wearing a face mask if working in moldy areas.  Indoor Mold Control Maintain humidity below 50%. Clean washable surfaces with 5% bleach solution. Remove sources e.g. Contaminated carpets.   Control of Dust  Mite Allergen Dust mites play a major role in allergic asthma and rhinitis. They occur in environments with high humidity wherever human skin is found. Dust mites absorb humidity from the atmosphere (ie, they do not drink) and feed on organic matter (including shed human and animal skin). Dust mites are a microscopic type of insect that you cannot see with the naked eye. High levels of dust mites have been detected from mattresses, pillows, carpets, upholstered furniture, bed covers, clothes, soft toys and any woven material. The principal allergen of the dust mite is found in its feces. A gram of dust may contain 1,000 mites and 250,000 fecal particles. Mite antigen is easily measured in the air during house cleaning activities. Dust mites do not bite and do not cause harm to humans, other than by triggering allergies/asthma.  Ways to decrease your exposure to dust mites in your home:  1. Encase mattresses, box springs and pillows with a mite-impermeable barrier or cover  2. Wash sheets, blankets and drapes weekly in hot water (130 F) with detergent and dry them in a dryer on the hot setting.  3. Have the room cleaned frequently with a vacuum cleaner and a damp dust-mop. For carpeting or rugs, vacuuming with a vacuum cleaner equipped with a high-efficiency particulate air (HEPA) filter. The dust mite allergic individual should not be in a room which is being cleaned and should wait 1 hour after cleaning before going into the room.  4. Do not sleep on upholstered furniture (eg, couches).  5. If possible removing carpeting, upholstered furniture and drapery from the home is ideal. Horizontal blinds should be eliminated in the rooms where the person spends the most time (bedroom, study, television room). Washable vinyl, roller-type shades are optimal.  6. Remove all non-washable stuffed toys from the bedroom. Wash stuffed toys weekly like sheets and blankets above.  7. Reduce indoor humidity to less  than 50%. Inexpensive humidity monitors can be purchased at most hardware stores. Do not use a humidifier as can make the problem worse and are not recommended.  Control of Cockroach Allergen Cockroach allergen has been identified as an important cause of acute attacks of asthma, especially in urban settings.  There are fifty-five species of cockroach that exist in the [place] , however only three, the American, German and Oriental species produce allergen that can affect patients with Asthma.  Allergens can be obtained from fecal particles, egg casings and secretions from cockroaches.    Remove food sources. Reduce access to water. Seal access and entry points. Spray runways with 0.5-1% Diazinon or Chlorpyrifos Blow boric acid power under stoves and refrigerator. Place bait stations (hydramethylnon) at feeding sites.

## 2024-09-29 ENCOUNTER — Ambulatory Visit: Admitting: Family Medicine

## 2024-10-03 ENCOUNTER — Ambulatory Visit (HOSPITAL_COMMUNITY)
Admission: RE | Admit: 2024-10-03 | Discharge: 2024-10-03 | Disposition: A | Discharge status: Home / Self Care | Source: Ambulatory Visit | Attending: Student | Admitting: Student

## 2024-10-03 DIAGNOSIS — M48061 Spinal stenosis, lumbar region without neurogenic claudication: Secondary | ICD-10-CM

## 2024-10-03 DIAGNOSIS — M5126 Other intervertebral disc displacement, lumbar region: Secondary | ICD-10-CM | POA: Diagnosis not present

## 2024-10-03 DIAGNOSIS — M51369 Other intervertebral disc degeneration, lumbar region without mention of lumbar back pain or lower extremity pain: Secondary | ICD-10-CM

## 2024-10-03 NOTE — Progress Notes (Signed)
 Patient was monitored by this RN during MRI scan due to presence of a pacemaker. Cardiac rhythm was continuously monitored throughout the procedure. Prior to the start of the scan, the pacemaker was placed in MRI-safe mode by the MRI technician and/or pacemaker representative. Patient was programmed to DOO 100 bpm. Following the completion of the scan, the device was returned to its pre-MRI settings. Neurological status and orientation post-procedure were unchanged from baseline.   Pre-procedure Heart Rate (Prior to being placed in MRI safe mode):80 bpm Post-procedure Heart Rate (Once pacemaker is returned to baseline mode):  74 bpm

## 2024-10-11 ENCOUNTER — Telehealth: Payer: Self-pay | Admitting: Internal Medicine

## 2024-10-11 NOTE — Telephone Encounter (Signed)
 Incoming call from pt regarding scheduling appt. Pt requesting sooner date. Pt scheduled for 02/18 Please advise. Thank you.

## 2024-10-11 NOTE — Telephone Encounter (Signed)
 The pt has been advised that no sooner appts are available at this time.  She will call back as she wishes to see if we have any cancellations.

## 2024-10-12 ENCOUNTER — Ambulatory Visit: Admitting: Family Medicine

## 2024-10-12 ENCOUNTER — Encounter: Payer: Self-pay | Admitting: Family Medicine

## 2024-10-12 ENCOUNTER — Other Ambulatory Visit: Payer: Self-pay

## 2024-10-12 VITALS — BP 112/90 | HR 72 | Temp 98.0°F

## 2024-10-12 DIAGNOSIS — B999 Unspecified infectious disease: Secondary | ICD-10-CM

## 2024-10-12 DIAGNOSIS — J3089 Other allergic rhinitis: Secondary | ICD-10-CM

## 2024-10-12 DIAGNOSIS — J4540 Moderate persistent asthma, uncomplicated: Secondary | ICD-10-CM

## 2024-10-12 DIAGNOSIS — J302 Other seasonal allergic rhinitis: Secondary | ICD-10-CM

## 2024-10-12 DIAGNOSIS — Z91018 Allergy to other foods: Secondary | ICD-10-CM

## 2024-10-12 NOTE — Progress Notes (Unsigned)
 "  [address] [place] [address] Dept: [phone]  FOLLOW UP NOTE  Patient ID: Mary Pratt, female    DOB: 09/19/1970  Age: 54 y.o. MRN: [id number] Date of Office Visit: 10/12/2024  Assessment  Chief Complaint: Urticaria (States that she wants to get testing for allergies and immuno issues.) and Pruritus  HPI Mary Pratt is a 54 year old female who presents to the clinic for follow-up visit.  She was last seen in this clinic on 01/07/2024 by Arlean Mutter, FNP, for evaluation of asthma and allergic rhinitis.  Discussed the use of AI scribe software for clinical note transcription with the patient, who gave verbal consent to proceed.  History of Present Illness Mary Pratt is a 54 year old female who presents for immune testing due to recurrent sinus infections. She was referred by her primary provider for immune testing due to frequent illnesses.  She experiences sinus infections approximately every other month, totaling about six times a year. Antibiotics are required once a year for these infections. She uses Allegra and Flonase  as needed for allergy  management, but not daily. Despite these treatments, her sinus symptoms are not consistently better.   Allergic rhinitis is reported as moderately well-controlled with occasional nasal symptoms for which she continues Flonase  and Allegra as needed with relief of symptoms.  Her last environmental allergy  skin testing on 03/12/2023 was positive to grass pollen, weed pollen, ragweed pollen, mold mix for, dust mite, dog, and cockroach.  She has started allergen immunotherapy on 2 separate occasions and is interested in restarting again.  She initially started allergen immunotherapy directed toward grass pollen, weed pollen, dog, dust mite, cockroach, and mold on 04/26/2023 with her last injection on 09/29/2023.  She restarted these injections on 11/12/2023 and received her last injection on 01/28/2024.  We discussed in detail that she may restart  allergen immunotherapy 3 times.  She is aware that this is her third time starting allergen immunotherapy.  Asthma is reported as well-controlled with no shortness of breath, cough, or wheeze with activity or rest.  She continues Advair twice a day and rarely needs to use her rescue inhaler.  She has recently had testing indicating positive alpha gal IgE.  She continues to avoid mammalian meat with no accidental ingestion or EpiPen  use since her last visit to this clinic.  Her current medications are listed in the chart.  Of note, patient is expecting lab testing, however, cannot state which lab testing she needs.  We were able to contact her primary care provider to request lab work and notes, however, we did not receive these documents before the patient left.  We did draw immune screening panel at today's visit due to recurrent infections.  Drug Allergies:  Allergies[1]  Physical Exam: BP (!) 112/90   Pulse 72   Temp 98 F (36.7 C)   LMP 09/14/2022   SpO2 97%    Physical Exam Vitals reviewed.  Constitutional:      Appearance: Normal appearance.  HENT:     Head: Normocephalic and atraumatic.     Right Ear: Tympanic membrane normal.     Left Ear: Tympanic membrane normal.     Nose:     Comments: Lateral naris slightly erythematous with thin clear nasal drainage noted.  Pharynx normal.  Ears normal.  Eyes normal.    Mouth/Throat:     Pharynx: Oropharynx is clear.  Eyes:     Conjunctiva/sclera: Conjunctivae normal.  Cardiovascular:     Rate  and Rhythm: Normal rate and regular rhythm.     Heart sounds: Normal heart sounds. No murmur heard. Pulmonary:     Effort: Pulmonary effort is normal.     Breath sounds: Normal breath sounds.     Comments: Lungs clear to auscultation Musculoskeletal:        General: Normal range of motion.     Cervical back: Normal range of motion and neck supple.  Skin:    General: Skin is warm and dry.  Neurological:     Mental Status: She is alert  and oriented to person, place, and time.  Psychiatric:        Mood and Affect: Mood normal.        Behavior: Behavior normal.        Thought Content: Thought content normal.        Judgment: Judgment normal.     Diagnostics: FVC 2.45 which is 100% of predicted value, FEV1 1.99 which is 100% of predicted value.  Spirometry indicates normal ventilatory function.food   Assessment and Plan: 1. Recurrent infections   2. Moderate persistent asthma, uncomplicated   3. Seasonal and perennial allergic rhinitis   4. Allergy  to alpha-gal     Patient Instructions  Asthma Continue Advair 250-1 puff once a day to prevent cough or wheeze.  Continue albuterol  2 puffs once every 4 hours if needed for cough or wheeze You may use albuterol  2 puffs 5 to 15 minutes before activity to decrease cough or wheeze  Allergic rhinitis Continue allergen avoidance measures directed toward grass pollen, weed pollen, dog, dust mite, cockroach, and mold as listed below Continue an antihistamine once a day if needed for runny nose or itch Continue Flonase  2 sprays each nostril once a day if needed for stuffy nose. In the right nostril, point the applicator out toward the right ear. In the left nostril, point the applicator out toward the left ear Consider saline nasal rinses as needed for nasal symptoms. Use this before any medicated nasal sprays for best result Restart allergen immunotherapy and have access to an epinephrine  autoinjector set per protocol  Alpha gal Continue to avoid mammalian meats.  In case of an allergic reaction, take cetirizine 10 mg once every 12-24 hours, and if life-threatening symptoms occur, inject with EpiPen  0.3 mg.  Recurrent infection A lab panel has been ordered to help us  evaluate your infections. We will call you when the results become available  Call the clinic if this treatment plan is not working well for you.  Follow up in 6 months or sooner if needed.   Return in about  6 months (around 04/11/2025), or if symptoms worsen or fail to improve.    Thank you for the opportunity to care for this patient.  Please do not hesitate to contact me with questions.  Arlean Mutter, FNP [facility]          [1]  Allergies Allergen Reactions   Clarithromycin Hives    Breaks out and gets whelps    clarithromycin   Amoxicillin Palpitations    REACTION: Tacycardia   Meloxicam Rash   Spiriva Respimat [Tiotropium Bromide] Rash   Symbicort  [Budesonide -Formoterol  Fumarate] Rash   "

## 2024-10-12 NOTE — Patient Instructions (Addendum)
 Asthma Continue Advair 250-1 puff once a day to prevent cough or wheeze.  Continue albuterol  2 puffs once every 4 hours if needed for cough or wheeze You may use albuterol  2 puffs 5 to 15 minutes before activity to decrease cough or wheeze  Allergic rhinitis Continue allergen avoidance measures directed toward grass pollen, weed pollen, dog, dust mite, cockroach, and mold as listed below Continue an antihistamine once a day if needed for runny nose or itch Continue Flonase  2 sprays each nostril once a day if needed for stuffy nose. In the right nostril, point the applicator out toward the right ear. In the left nostril, point the applicator out toward the left ear Consider saline nasal rinses as needed for nasal symptoms. Use this before any medicated nasal sprays for best result Restart allergen immunotherapy and have access to an epinephrine  autoinjector set per protocol  Alpha gal Continue to avoid mammalian meats.  In case of an allergic reaction, take cetirizine 10 mg once every 12-24 hours, and if life-threatening symptoms occur, inject with EpiPen  0.3 mg.  Recurrent infection A lab panel has been ordered to help us  evaluate your infections. We will call you when the results become available  Call the clinic if this treatment plan is not working well for you.  Follow up in 6 months or sooner if needed.  Reducing Pollen Exposure The American Academy of All[employer] , Asthma and Immun[employer] suggests the following steps to reduce your exposure to pollen during allergy  seasons. Do not hang sheets or clothing out to dry; pollen may collect on these items. Do not mow lawns or spend time around freshly cut grass; mowing stirs up pollen. Keep windows closed at night.  Keep car windows closed while driving. Minimize morning activities outdoors, a time when pollen counts are usually at their highest. Stay indoors as much as possible when pollen counts or humidity is high and on windy days when  pollen tends to remain in the air longer. Use air conditioning when possible.  Many air conditioners have filters that trap the pollen spores. Use a HEPA room air filter to remove pollen form the indoor air you breathe.  Control of Dog or Cat Allergen Avoidance is the best way to manage a dog or cat allergy . If you have a dog or cat and are allergic to dog or cats, consider removing the dog or cat from the home. If you have a dog or cat but dont want to find it a new home, or if your family wants a pet even though someone in the household is allergic, here are some strategies that may help keep symptoms at bay:  Keep the pet out of your bedroom and restrict it to only a few rooms. Be advised that keeping the dog or cat in only one room will not limit the allergens to that room. Dont pet, hug or kiss the dog or cat; if you do, wash your hands with soap and water. High-efficiency particulate air (HEPA) cleaners run continuously in a bedroom or living room can reduce allergen levels over time. Regular use of a high-efficiency vacuum cleaner or a central vacuum can reduce allergen levels. Giving your dog or cat a bath at least once a week can reduce airborne allergen.  Control of Mold Allergen Mold and fungi can grow on a variety of surfaces provided certain temperature and moisture conditions exist.  Outdoor molds grow on plants, decaying vegetation and soil.  The major outdoor mold, Alternaria and Cladosporium, are  found in very high numbers during hot and dry conditions.  Generally, a late Summer - Fall peak is seen for common outdoor fungal spores.  Rain will temporarily lower outdoor mold spore count, but counts rise rapidly when the rainy period ends.  The most important indoor molds are Aspergillus and Penicillium.  Dark, humid and poorly ventilated basements are ideal sites for mold growth.  The next most common sites of mold growth are the bathroom and the kitchen.  Outdoor [employer] Use  air conditioning and keep windows closed Avoid exposure to decaying vegetation. Avoid leaf raking. Avoid grain handling. Consider wearing a face mask if working in moldy areas.  Indoor Mold Control Maintain humidity below 50%. Clean washable surfaces with 5% bleach solution. Remove sources e.g. Contaminated carpets.   Control of Dust Mite Allergen Dust mites play a major role in allergic asthma and rhinitis. They occur in environments with high humidity wherever human skin is found. Dust mites absorb humidity from the atmosphere (ie, they do not drink) and feed on organic matter (including shed human and animal skin). Dust mites are a microscopic type of insect that you cannot see with the naked eye. High levels of dust mites have been detected from mattresses, pillows, carpets, upholstered furniture, bed covers, clothes, soft toys and any woven material. The principal allergen of the dust mite is found in its feces. A gram of dust may contain 1,000 mites and 250,000 fecal particles. Mite antigen is easily measured in the air during house cleaning activities. Dust mites do not bite and do not cause harm to humans, other than by triggering allergies/asthma.  Ways to decrease your exposure to dust mites in your home:  1. Encase mattresses, box springs and pillows with a mite-impermeable barrier or cover  2. Wash sheets, blankets and drapes weekly in hot water (130 F) with detergent and dry them in a dryer on the hot setting.  3. Have the room cleaned frequently with a vacuum cleaner and a damp dust-mop. For carpeting or rugs, vacuuming with a vacuum cleaner equipped with a high-efficiency particulate air (HEPA) filter. The dust mite allergic individual should not be in a room which is being cleaned and should wait 1 hour after cleaning before going into the room.  4. Do not sleep on upholstered furniture (eg, couches).  5. If possible removing carpeting, upholstered furniture and drapery from  the home is ideal. Horizontal blinds should be eliminated in the rooms where the person spends the most time (bedroom, study, television room). Washable vinyl, roller-type shades are optimal.  6. Remove all non-washable stuffed toys from the bedroom. Wash stuffed toys weekly like sheets and blankets above.  7. Reduce indoor humidity to less than 50%. Inexpensive humidity monitors can be purchased at most hardware stores. Do not use a humidifier as can make the problem worse and are not recommended.  Control of Cockroach Allergen Cockroach allergen has been identified as an important cause of acute attacks of asthma, especially in urban settings.  There are fifty-five species of cockroach that exist in the [place] , however only three, the American, German and Oriental species produce allergen that can affect patients with Asthma.  Allergens can be obtained from fecal particles, egg casings and secretions from cockroaches.    Remove food sources. Reduce access to water. Seal access and entry points. Spray runways with 0.5-1% Diazinon or Chlorpyrifos Blow boric acid power under stoves and refrigerator. Place bait stations (hydramethylnon) at feeding sites.

## 2024-10-13 ENCOUNTER — Encounter: Payer: Self-pay | Admitting: Family Medicine

## 2024-10-13 DIAGNOSIS — Z91018 Allergy to other foods: Secondary | ICD-10-CM | POA: Insufficient documentation

## 2024-10-13 DIAGNOSIS — B999 Unspecified infectious disease: Secondary | ICD-10-CM | POA: Insufficient documentation

## 2024-10-13 LAB — CBC WITH DIFF/PLATELET
Basophils Absolute: 0 10*3/uL (ref 0.0–0.2)
Basos: 1 %
EOS (ABSOLUTE): 0.2 10*3/uL (ref 0.0–0.4)
Eos: 4 %
Hematocrit: 40.5 % (ref 34.0–46.6)
Hemoglobin: 13.1 g/dL (ref 11.1–15.9)
Immature Grans (Abs): 0 10*3/uL (ref 0.0–0.1)
Immature Granulocytes: 0 %
Lymphocytes Absolute: 2 10*3/uL (ref 0.7–3.1)
Lymphs: 42 %
MCH: 26.7 pg (ref 26.6–33.0)
MCHC: 32.3 g/dL (ref 31.5–35.7)
MCV: 83 fL (ref 79–97)
Monocytes Absolute: 0.4 10*3/uL (ref 0.1–0.9)
Monocytes: 7 %
Neutrophils Absolute: 2.2 10*3/uL (ref 1.4–7.0)
Neutrophils: 46 %
Platelets: 255 10*3/uL (ref 150–450)
RBC: 4.91 x10E6/uL (ref 3.77–5.28)
RDW: 15.6 % — ABNORMAL HIGH (ref 11.7–15.4)
WBC: 4.8 10*3/uL (ref 3.4–10.8)

## 2024-10-13 LAB — IGG, IGA, IGM
IgG (Immunoglobin G), Serum: 1737 mg/dL — ABNORMAL HIGH (ref 586–1602)
IgM (Immunoglobulin M), Srm: 195 mg/dL (ref 26–217)
Immunoglobulin A, (IgA) QN, Serum: 196 mg/dL (ref 87–352)

## 2024-10-13 LAB — COMPLEMENT, TOTAL

## 2024-10-13 LAB — TETANUS ANTIBODY, IGG

## 2024-10-13 NOTE — Addendum Note (Signed)
 Addended by: NANCEE JON SAILOR on: 10/13/2024 12:56 PM   Modules accepted: Orders

## 2024-10-23 ENCOUNTER — Ambulatory Visit: Admitting: Neurology

## 2024-10-25 ENCOUNTER — Ambulatory Visit: Admitting: Internal Medicine

## 2024-11-15 ENCOUNTER — Ambulatory Visit: Admitting: Neurology

## 2024-11-22 ENCOUNTER — Ambulatory Visit: Admitting: Cardiovascular Disease

## 2025-01-08 ENCOUNTER — Inpatient Hospital Stay

## 2025-01-08 ENCOUNTER — Inpatient Hospital Stay: Admitting: Nurse Practitioner

## 2025-01-31 ENCOUNTER — Ambulatory Visit: Admitting: Dermatology

## 2025-04-16 ENCOUNTER — Ambulatory Visit: Admitting: Physician Assistant
